# Patient Record
Sex: Female | Born: 1945
Health system: Southern US, Community
[De-identification: ages and names within clinical notes are randomized; demographics above are authoritative.]

## PROBLEM LIST (undated history)

## (undated) DIAGNOSIS — F329 Major depressive disorder, single episode, unspecified: Secondary | ICD-10-CM

## (undated) DIAGNOSIS — E785 Hyperlipidemia, unspecified: Secondary | ICD-10-CM

## (undated) DIAGNOSIS — S79911A Unspecified injury of right hip, initial encounter: Secondary | ICD-10-CM

## (undated) DIAGNOSIS — K5792 Diverticulitis of intestine, part unspecified, without perforation or abscess without bleeding: Secondary | ICD-10-CM

## (undated) DIAGNOSIS — S0591XA Unspecified injury of right eye and orbit, initial encounter: Secondary | ICD-10-CM

## (undated) DIAGNOSIS — K219 Gastro-esophageal reflux disease without esophagitis: Secondary | ICD-10-CM

## (undated) DIAGNOSIS — N059 Unspecified nephritic syndrome with unspecified morphologic changes: Secondary | ICD-10-CM

## (undated) DIAGNOSIS — H269 Unspecified cataract: Secondary | ICD-10-CM

## (undated) DIAGNOSIS — R748 Abnormal levels of other serum enzymes: Secondary | ICD-10-CM

## (undated) DIAGNOSIS — J690 Pneumonitis due to inhalation of food and vomit: Secondary | ICD-10-CM

## (undated) DIAGNOSIS — R3129 Other microscopic hematuria: Secondary | ICD-10-CM

## (undated) DIAGNOSIS — H3530 Unspecified macular degeneration: Secondary | ICD-10-CM

## (undated) DIAGNOSIS — K635 Polyp of colon: Secondary | ICD-10-CM

## (undated) DIAGNOSIS — I10 Essential (primary) hypertension: Secondary | ICD-10-CM

## (undated) DIAGNOSIS — F32A Depression, unspecified: Secondary | ICD-10-CM

## (undated) DIAGNOSIS — Z9889 Other specified postprocedural states: Secondary | ICD-10-CM

## (undated) DIAGNOSIS — D649 Anemia, unspecified: Secondary | ICD-10-CM

## (undated) DIAGNOSIS — E669 Obesity, unspecified: Secondary | ICD-10-CM

## (undated) DIAGNOSIS — M545 Low back pain, unspecified: Secondary | ICD-10-CM

## (undated) DIAGNOSIS — R112 Nausea with vomiting, unspecified: Secondary | ICD-10-CM

## (undated) DIAGNOSIS — H35039 Hypertensive retinopathy, unspecified eye: Secondary | ICD-10-CM

## (undated) DIAGNOSIS — G629 Polyneuropathy, unspecified: Secondary | ICD-10-CM

## (undated) DIAGNOSIS — J309 Allergic rhinitis, unspecified: Secondary | ICD-10-CM

## (undated) DIAGNOSIS — M5126 Other intervertebral disc displacement, lumbar region: Secondary | ICD-10-CM

## (undated) DIAGNOSIS — E119 Type 2 diabetes mellitus without complications: Secondary | ICD-10-CM

## (undated) DIAGNOSIS — M722 Plantar fascial fibromatosis: Secondary | ICD-10-CM

## (undated) DIAGNOSIS — F419 Anxiety disorder, unspecified: Secondary | ICD-10-CM

## (undated) HISTORY — DX: Hyperlipidemia, unspecified: E78.5

## (undated) HISTORY — PX: ABDOMINAL HYSTERECTOMY: SHX81

## (undated) HISTORY — DX: Other microscopic hematuria: R31.29

## (undated) HISTORY — DX: Unspecified injury of right hip, initial encounter: S79.911A

## (undated) HISTORY — PX: TUBAL LIGATION: SHX77

## (undated) HISTORY — DX: Type 2 diabetes mellitus without complications: E11.9

## (undated) HISTORY — DX: Polyneuropathy, unspecified: G62.9

## (undated) HISTORY — DX: Essential (primary) hypertension: I10

## (undated) HISTORY — DX: Gastro-esophageal reflux disease without esophagitis: K21.9

## (undated) HISTORY — DX: Allergic rhinitis, unspecified: J30.9

## (undated) HISTORY — PX: EYE SURGERY: SHX253

## (undated) HISTORY — DX: Unspecified injury of right eye and orbit, initial encounter: S05.91XA

## (undated) HISTORY — DX: Low back pain, unspecified: M54.50

## (undated) HISTORY — PX: OTHER SURGICAL HISTORY: SHX169

## (undated) HISTORY — DX: Obesity, unspecified: E66.9

## (undated) HISTORY — DX: Hypertensive retinopathy, unspecified eye: H35.039

## (undated) HISTORY — PX: BREAST BIOPSY: SHX20

## (undated) HISTORY — PX: PERONEAL NERVE DECOMPRESSION: SHX2226

## (undated) HISTORY — DX: Unspecified cataract: H26.9

## (undated) HISTORY — DX: Abnormal levels of other serum enzymes: R74.8

## (undated) HISTORY — DX: Polyp of colon: K63.5

## (undated) HISTORY — PX: BACK SURGERY: SHX140

## (undated) HISTORY — PX: APPENDECTOMY: SHX54

## (undated) HISTORY — PX: CHOLECYSTECTOMY: SHX55

## (undated) HISTORY — DX: Anxiety disorder, unspecified: F41.9

## (undated) HISTORY — PX: GANGLION CYST EXCISION: SHX1691

## (undated) HISTORY — DX: Plantar fascial fibromatosis: M72.2

## (undated) HISTORY — DX: Unspecified macular degeneration: H35.30

---

## 1991-10-01 HISTORY — PX: LIPOMA EXCISION: SHX5283

## 1998-10-05 ENCOUNTER — Ambulatory Visit (HOSPITAL_COMMUNITY): Admission: RE | Admit: 1998-10-05 | Discharge: 1998-10-05 | Payer: Self-pay | Admitting: Internal Medicine

## 1999-02-20 ENCOUNTER — Other Ambulatory Visit: Admission: RE | Admit: 1999-02-20 | Discharge: 1999-02-20 | Payer: Self-pay | Admitting: Obstetrics and Gynecology

## 1999-09-05 ENCOUNTER — Encounter: Payer: Self-pay | Admitting: Internal Medicine

## 1999-09-05 ENCOUNTER — Encounter: Admission: RE | Admit: 1999-09-05 | Discharge: 1999-09-05 | Payer: Self-pay | Admitting: Internal Medicine

## 2000-03-03 ENCOUNTER — Ambulatory Visit (HOSPITAL_COMMUNITY): Admission: RE | Admit: 2000-03-03 | Discharge: 2000-03-03 | Payer: Self-pay | Admitting: Obstetrics and Gynecology

## 2000-03-03 ENCOUNTER — Encounter: Payer: Self-pay | Admitting: Obstetrics and Gynecology

## 2000-04-21 ENCOUNTER — Other Ambulatory Visit: Admission: RE | Admit: 2000-04-21 | Discharge: 2000-04-21 | Payer: Self-pay | Admitting: Obstetrics and Gynecology

## 2000-12-15 ENCOUNTER — Ambulatory Visit (HOSPITAL_COMMUNITY): Admission: RE | Admit: 2000-12-15 | Discharge: 2000-12-15 | Payer: Self-pay | Admitting: Gastroenterology

## 2001-03-13 ENCOUNTER — Encounter: Payer: Self-pay | Admitting: Obstetrics and Gynecology

## 2001-03-13 ENCOUNTER — Ambulatory Visit (HOSPITAL_COMMUNITY): Admission: RE | Admit: 2001-03-13 | Discharge: 2001-03-13 | Payer: Self-pay | Admitting: Obstetrics and Gynecology

## 2001-07-09 ENCOUNTER — Other Ambulatory Visit: Admission: RE | Admit: 2001-07-09 | Discharge: 2001-07-09 | Payer: Self-pay | Admitting: Obstetrics and Gynecology

## 2002-05-04 ENCOUNTER — Ambulatory Visit (HOSPITAL_COMMUNITY): Admission: RE | Admit: 2002-05-04 | Discharge: 2002-05-04 | Payer: Self-pay | Admitting: Obstetrics and Gynecology

## 2002-05-04 ENCOUNTER — Encounter: Payer: Self-pay | Admitting: Obstetrics and Gynecology

## 2003-01-27 ENCOUNTER — Other Ambulatory Visit: Admission: RE | Admit: 2003-01-27 | Discharge: 2003-01-27 | Payer: Self-pay | Admitting: Obstetrics and Gynecology

## 2003-08-29 ENCOUNTER — Encounter: Admission: RE | Admit: 2003-08-29 | Discharge: 2003-11-27 | Payer: Self-pay | Admitting: Internal Medicine

## 2003-12-17 ENCOUNTER — Emergency Department (HOSPITAL_COMMUNITY): Admission: AD | Admit: 2003-12-17 | Discharge: 2003-12-17 | Payer: Self-pay | Admitting: Family Medicine

## 2004-03-14 ENCOUNTER — Encounter (HOSPITAL_BASED_OUTPATIENT_CLINIC_OR_DEPARTMENT_OTHER): Admission: RE | Admit: 2004-03-14 | Discharge: 2004-04-20 | Payer: Self-pay | Admitting: Internal Medicine

## 2007-09-28 ENCOUNTER — Ambulatory Visit: Payer: Self-pay | Admitting: Cardiology

## 2007-10-06 ENCOUNTER — Ambulatory Visit: Payer: Self-pay

## 2007-10-28 ENCOUNTER — Ambulatory Visit: Payer: Self-pay | Admitting: Cardiology

## 2008-02-24 ENCOUNTER — Ambulatory Visit: Payer: Self-pay | Admitting: Cardiology

## 2008-02-27 ENCOUNTER — Ambulatory Visit (HOSPITAL_COMMUNITY): Admission: RE | Admit: 2008-02-27 | Discharge: 2008-02-29 | Payer: Self-pay | Admitting: Neurosurgery

## 2008-11-24 ENCOUNTER — Encounter: Admission: RE | Admit: 2008-11-24 | Discharge: 2008-11-24 | Payer: Self-pay | Admitting: Neurosurgery

## 2009-02-01 ENCOUNTER — Encounter: Payer: Self-pay | Admitting: Cardiology

## 2009-02-15 DIAGNOSIS — K219 Gastro-esophageal reflux disease without esophagitis: Secondary | ICD-10-CM | POA: Insufficient documentation

## 2009-02-15 DIAGNOSIS — I10 Essential (primary) hypertension: Secondary | ICD-10-CM | POA: Insufficient documentation

## 2009-02-15 DIAGNOSIS — E669 Obesity, unspecified: Secondary | ICD-10-CM | POA: Insufficient documentation

## 2009-02-15 DIAGNOSIS — H269 Unspecified cataract: Secondary | ICD-10-CM | POA: Insufficient documentation

## 2009-02-15 DIAGNOSIS — E785 Hyperlipidemia, unspecified: Secondary | ICD-10-CM | POA: Insufficient documentation

## 2009-02-15 DIAGNOSIS — E119 Type 2 diabetes mellitus without complications: Secondary | ICD-10-CM | POA: Insufficient documentation

## 2009-03-27 ENCOUNTER — Ambulatory Visit: Payer: Self-pay | Admitting: Cardiology

## 2010-03-13 ENCOUNTER — Ambulatory Visit: Payer: Self-pay | Admitting: Cardiology

## 2010-09-27 ENCOUNTER — Ambulatory Visit (HOSPITAL_COMMUNITY)
Admission: RE | Admit: 2010-09-27 | Discharge: 2010-09-27 | Payer: Self-pay | Source: Home / Self Care | Attending: Gastroenterology | Admitting: Gastroenterology

## 2010-11-01 NOTE — Assessment & Plan Note (Signed)
Summary: PER CHECK OUT/SF  Medications Added METFORMIN HCL 500 MG TABS (METFORMIN HCL) 2 tabs two times a day EFFEXOR XR 150 MG XR24H-CAP (VENLAFAXINE HCL) 1 cap once daily GABAPENTIN 300 MG CAPS (GABAPENTIN) 1 cap three times a day        Visit Type:  1 yr f/u Primary Provider:  [name] MD  CC:  pt c/o left carotid neck pain when she is on the treadmill...no other complaints todday..pt has lost 10 lb since 10/2008.  History of Present Illness: Tina Briggs returns today for evaluation and management of her multiple cardiac risk factors.  Her blood sugar started to get out of control earlier this year. She began to walk 2 hours a week. She has lost 12 pounds! She feels remarkably better. She denies any angina or ischemic symptoms. She's had no edema.  Her blood pressures under good control. Recent hemoglobin A1c was 7%. She is working on this. Her lipids have been under good control.  Current Medications (verified): 1)  Prilosec 20 Mg Cpdr (Omeprazole) .Marland Kitchen.. 1 Tab Once Daily 2)  Metformin Hcl 500 Mg Tabs (Metformin Hcl) .... 2 Tabs Two Times A Day 3)  Aspirin 81 Mg Tbec (Aspirin) .... Take One Tablet By Mouth Daily 4)  Januvia 100 Mg Tabs (Sitagliptin Phosphate) .Marland Kitchen.. 1 Tab Once Daily 5)  Effexor Xr 150 Mg Xr24h-Cap (Venlafaxine Hcl) .Marland Kitchen.. 1 Cap Once Daily 6)  Crestor 40 Mg Tabs (Rosuvastatin Calcium) .Marland Kitchen.. 1 Tab At Bedtime 7)  Clarinex 5 Mg Tabs (Desloratadine) .... As Needed 8)  Flonase 50 Mcg/act Susp (Fluticasone Propionate) .... As Needed 9)  Tylenol Extra Strength 500 Mg Tabs (Acetaminophen) .... As Needed 10)  Advil 200 Mg Tabs (Ibuprofen) .... As Needed 11)  Gabapentin 300 Mg Caps (Gabapentin) .Marland Kitchen.. 1 Cap Three Times A Day 12)  Losartan Potassium 100 Mg Tabs (Losartan Potassium) .Marland Kitchen.. 1 Tab Once Daily  Allergies: 1)  ! * Cyclines 2)  ! Codeine 3)  ! Erythromycin  Past History:  Past Medical History: Last updated: 02/15/2009 HYPERLIPIDEMIA-MIXED  (ICD-272.4) HYPERTENSION, UNSPECIFIED (ICD-401.9) OBESITY (ICD-278.00) GERD (ICD-530.81) DM (ICD-250.00) CATARACT, RIGHT EYE (ICD-366.9)    Past Surgical History: Last updated: 02/15/2009 Appendectomy 1973 Cholecystectomy 1973 Hysterectomy 1992 Left breast bx 1984 Tubal ligation 1976  Family History: Last updated: 02/15/2009 Neg for premature heart disease  Social History: Last updated: 02/15/2009 Full Time Tobacco Use - No.   Risk Factors: Smoking Status: never (02/15/2009)  Review of Systems       negative history of present illness  Vital Signs:  Patient profile:   65 year old female Height:      63 inches Weight:      187 pounds BMI:     33.25 Pulse rate:   80 / minute Pulse rhythm:   irregular BP sitting:   108 / 70  (left arm) Cuff size:   large  Vitals Entered By: Tina Briggs, CMA (March 13, 2010 11:28 AM)  Physical Exam  General:  obese but has lost weight.no acute distress Head:  normocephalic and atraumatic Eyes:  PERRLA/EOM intact; conjunctiva and lids normal. Neck:  Neck supple, no JVD. No masses, thyromegaly or abnormal cervical nodes. Chest Yeng Frankie:  no deformities or breast masses noted Lungs:  Clear bilaterally to auscultation and percussion. Heart:  Non-displaced PMI, chest non-tender; regular rate and rhythm, S1, S2 without murmurs, rubs or gallops. Carotid upstroke normal, no bruit. Normal abdominal aortic size, no bruits. Femorals normal pulses, no bruits. Pedals normal pulses.  No edema, no varicosities. Msk:  Back normal, normal gait. Muscle strength and tone normal. Pulses:  pulses normal in all 4 extremities Extremities:  No clubbing or cyanosis. Neurologic:  Alert and oriented x 3. Skin:  Intact without lesions or rashes. Psych:  Normal affect.   EKG  Procedure date:  03/13/2010  Findings:      normal sinus rhythm, left axis deviation, no change  Impression & Recommendations:  Problem # 1:  HYPERTENSION, UNSPECIFIED  (ICD-401.9) Assessment Improved  The following medications were removed from the medication list:    Cozaar 100 Mg Tabs (Losartan potassium) .Marland Kitchen... 1 tab once daily Her updated medication list for this problem includes:    Aspirin 81 Mg Tbec (Aspirin) .Marland Kitchen... Take one tablet by mouth daily    Losartan Potassium 100 Mg Tabs (Losartan potassium) .Marland Kitchen... 1 tab once daily  Problem # 2:  OBESITY (ICD-278.00) Assessment: Improved  Problem # 3:  HYPERLIPIDEMIA-MIXED (ICD-272.4) Assessment: Improved  Her updated medication list for this problem includes:    Crestor 40 Mg Tabs (Rosuvastatin calcium) .Marland Kitchen... 1 tab at bedtime  Problem # 4:  DM (ICD-250.00) Assessment: Improved  The following medications were removed from the medication list:    Cozaar 100 Mg Tabs (Losartan potassium) .Marland Kitchen... 1 tab once daily Her updated medication list for this problem includes:    Metformin Hcl 500 Mg Tabs (Metformin hcl) .Marland Kitchen... 2 tabs two times a day    Aspirin 81 Mg Tbec (Aspirin) .Marland Kitchen... Take one tablet by mouth daily    Januvia 100 Mg Tabs (Sitagliptin phosphate) .Marland Kitchen... 1 tab once daily    Losartan Potassium 100 Mg Tabs (Losartan potassium) .Marland Kitchen... 1 tab once daily  Patient Instructions: 1)  Your physician recommends that you schedule a follow-up appointment in: 1 year with Dr. Daleen Squibb 2)  Your physician recommends that you continue on your current medications as directed. Please refer to the Current Medication list given to you today. Prescriptions: CRESTOR 40 MG TABS (ROSUVASTATIN CALCIUM) 1 tab at bedtime  #90 x 3   Entered by:   Lisabeth Devoid RN   Authorized by:   Gaylord Shih, MD, [facility]   Signed by:   Lisabeth Devoid RN on 03/13/2010   Method used:   Electronically to        [facility] [phone]* (retail)       [address]       [place], [place]  [address]       Ph: [phone] or [phone]       Fax: [phone]   RxID:   [id number]

## 2010-12-10 LAB — GLUCOSE, CAPILLARY: Glucose-Capillary: 167 mg/dL — ABNORMAL HIGH (ref 70–99)

## 2011-02-12 NOTE — Assessment & Plan Note (Signed)
[facility]                            CARDIOLOGY OFFICE NOTE   NAME:Deweese, LUS KRIEGEL                  MRN:          [id number]  DATE:10/28/2007                            DOB:          July 08, 1946    ADDENDUM:   Please send a copy of report to Dr. Richardean Chimera at [facility]  and also to Dr. [name], [facility] at [facility],  [facility], [address], [address] [place],  [place], [place] [address].     Thomas C. Daleen Squibb, MD, [facility]  Electronically Signed    TCW/MedQ  DD: 10/28/2007  DT: 10/28/2007  Job #: [id number]

## 2011-02-12 NOTE — Assessment & Plan Note (Signed)
[facility]                            CARDIOLOGY OFFICE NOTE   NAME:Pineiro, QUINTAVIA ROGSTAD                  MRN:          [id number]  DATE:10/28/2007                            DOB:          December 28, 1945    Ms. Lahue returns today after being seen initially on September 28, 2007.   PROBLEM LIST:  1. Essential hypertension that has been more difficult to control      lately.  2. Type 2 diabetes.  3. Obesity.  4. Sedentary lifestyle.  5. Hyperlipidemia.   Her exercise rest-stress Myoview demonstrated no obstructive coronary  disease.  Her ejection fraction was 73% with normal wall motion and  contractility.  She had a hypertensive blood pressure response to  exercise.  She exercised for only 4-1/2 minutes but her blood pressure  increased to 196 systolic.   She brings in her blood work today from the outside, which showed a  total cholesterol of 154, triglycerides of 189, direct LDL of 93, HDL  45, cholesterol/HDL ratio of 3.42 on 40 mg of Lipitor or day.  Her blood  sugar was 145.  Hemoglobin A1c was 6.4%.   She also has checked numerous blood pressure for [place].  They are averaging  now on 100 mg of Cozaar (that is what we increased her dose to last  time) at about 130-135 at most over about 80-85.   Her blood pressure today is still remarkably good at 118/78.  Her pulse  is 82 and regular.  Her weight is down.  The rest of her exam is  unchanged.   I have had about a 30-minute discussion today with Ms. Raneri about  aggressive risk factor modification.  I do not think she will obtain her  LDL goal by doubling her Lipitor to 80 mg a day.  I have recommended  either Vytorin or Crestor at maximum dose.  I think the Crestor is  something we are both more comfortable with at 40 mg a day.   She will need follow-up lipids and LFTs in 6 weeks.  I will sit down and  talk to her again at that time.   Her blood pressure seems to be under good  control at this time with just  Cozaar.  She brought a list of medicines tried the past that she did not  tolerate, which included an ACE inhibitor, which causes a cough, and  diltiazem, which caused fatigue.   She will continue with aspirin 81 mg a day, tight blood sugar control,  and try to increase her activity.     Thomas C. Daleen Squibb, MD, [facility]  Electronically Signed    TCW/MedQ  DD: 10/28/2007  DT: 10/29/2007  Job #: [id number]   cc:   Juluis Mire, M.D.  [name], MD

## 2011-02-12 NOTE — Assessment & Plan Note (Signed)
[facility]                            CARDIOLOGY OFFICE NOTE   NAME:Tina Briggs                  MRN:          [id number]  DATE:09/28/2007                            DOB:          18-Feb-1946    I was asked by Dr. Richardean Chimera to consult on Tina Briggs  concerning her hypertension.   HISTORY OF PRESENT ILLNESS:  She is 65 years of age, married and has two  children.  She has been having increasing problems with blood pressure  control and says that she is getting concerned about it.  She also has  diabetes, which is well controlled with hemoglobin A1c that run around  6%.   She says her systolic blood pressures are running around 151-60 at  times.   She has been on Cozaar for a number of years.  She is on 50 mg a day.   She has other cardiac risk factors including type 2 diabetes as  mentioned above and also hyperlipidemia.  She had been on Lipitor for at  least 5-6 years.  She says her heart rate usually runs about 85-90 beats  a minute.   PAST MEDICAL HISTORY:  SHE IS INTOLERANT OF MORPHINE, CODEINE, CYCLINE,  AMOXICILLIN, ERYTHROMYCIN MINOCYCLINE.   She does not smoke and does not drink alcohol.   She does not exercise on a regular basis.   CURRENT MEDICATIONS:  1. Prilosec 20 mg a day.  2. Cozaar 50 mg a day.  3. Metformin 500 mg p.o. b.i.d.  4. Aspirin 81 mg a day.  5. Effexor XR 75 mg a day.  6. Januvia 100 mg daily.  7. Lipitor 40 mg a day.  8. P.r.n. Clarinex, Flonase, Tylenol,  Advil and Sudafed p.r.n..   HISTORY OF SURGERIES:  1. Hysterectomy 1992.  2. Left breast biopsy in 1984.  3. Tubal ligation 1976.  4. Cholecystectomy 1973.  5. Appendectomy 1973.   FAMILY HISTORY:  Is negative for premature heart disease.   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   REVIEW OF SYSTEMS:  She denies any orthopnea, PND,  peripheral edema,  syncope, presyncope, angina or dyspnea on exertion of any significant  degree. Rest of review of systems are negative except for history of  gastroesophageal reflux, urinary tract problems as a child and anxiety  and depression.   PHYSICAL EXAMINATION:  Blood pressure 118/74.  Her pulse is 90-100 and  regular.  Her electrocardiogram is normal.  She is 5 feet and 3 inches.  Weight is 198 pounds.  Extremely pleasant.  Alert and oriented x3.  HEENT:  Normocephalic, atraumatic.  PERRLA.  Extraocular movements  intact.  Sclerae clear.  Dentition satisfactory; she wears glasses.  NECK: is supple.  Carotid upstrokes were equal bilaterally without  bruits.  There is no JVD.  Thyroid is not enlarged.  Trachea is midline.  LUNGS:  Clear.  HEART:  Reveals poorly appreciated PMI.  Normal S1-S2.  ABDOMEN: soft, good bowel sounds.  No midline bruits. There is no  hepatomegaly. Organomegaly was difficult to assess in general.  EXTREMITIES:  No edema.  Pulses are intact.  NEURO:  Exam is intact.  SKIN: is unremarkable.   ASSESSMENT:  1. Essential hypertension, now not responsible to a low dose of a      single class of antihypertensive.  2. Coronary equivalency with her type 2 diabetes and other risk      factors including her hyperlipidemia.   PLAN:  1. Exercise rest/stress Myoview to rule out any obstructive coronary      disease.  2. Increase Cozaar to 100 mg a day.  3. Keep blood pressure log.  4. Followup with me in a couple weeks to discuss her blood pressure      findings.  I have advised her that she is probably going to need at      least 2 or 3 different classes of antihypertensives to probably      control her blood pressure goal which is 130/80.  She says she was      intolerant to blood pressure medicines in the past and she will      bring a record of this.  She will also bring her cholesterol      profile me as well.     Thomas C. Daleen Squibb, MD, [facility]   Electronically Signed    TCW/MedQ  DD: 09/28/2007  DT: 09/28/2007  Job #: [id number]   cc:   Tina Briggs, M.D.

## 2011-02-12 NOTE — Assessment & Plan Note (Signed)
[facility]                            CARDIOLOGY OFFICE NOTE   NAME:Briggs, Tina KOLTZ                  MRN:          [id number]  DATE:02/24/2008                            DOB:          Nov 03, 1945    Tina Briggs returns today for further management of her hypertension  as well as her mixed hyperlipidemia.   She has had a very tough year.  She was very sick this winter with a  sinus upper respiratory condition that she could not shake.  In  addition, she has had a hard time with her joints as well as now she has  a slipped disk with numbness her left leg.   She has complied with medications and is currently on Crestor 40 mg  nightly as well as Cozaar 100 mg a day.  She takes aspirin 81 mg a day  as well as metformin 500 b.i.d.   Her laboratory data in April with Dr. Donette Larry showed a hemoglobin A1c of  6.1%, normal LFTs, total cholesterol 136, LDL 64, HDL 44, triglycerides  of 409.   We talked today about dietary restriction with carbs.   Her blood pressure is 142/88, her pulse is 100 and regular.  Her weight  is 189 which is down 2.  HEENT:  No change.  Carotid upstrokes are equal bilateral without bruits, no JVD.  Thyroid  is not enlarged.  Trachea is midline.  LUNGS:  Clear.  HEART:  Reveals regular rate and rhythm.  PMI is hard to appreciate.  ABDOMEN:  Soft, good bowel sounds.  No midline bruits.  EXTREMITIES:  No cyanosis, clubbing or edema.  Pulses are intact.  NEURO:  Exam is intact.   [name] is doing pretty well with her numbers.  I have encouraged  her to watch carbs and try to reduce her weight further.  I do not think  she will be able to increase her exercise very much because of her back.  I will see her back again in April 2010.     Thomas C. Daleen Squibb, MD, [facility]  Electronically Signed    TCW/MedQ  DD: 02/24/2008  DT: 02/24/2008  Job #: [id number]   cc:   [name], MD

## 2011-02-12 NOTE — Op Note (Signed)
Tina Briggs, Tina Briggs           ACCOUNT NO.:  [id number]   MEDICAL RECORD NO.:  [id number]          PATIENT TYPE:  INP   LOCATION:  2852                         FACILITY:  [facility]   PHYSICIAN:  Hewitt Shorts, M.D.DATE OF BIRTH:  11/21/1945   DATE OF PROCEDURE:  DATE OF DISCHARGE:                               OPERATIVE REPORT   PREOPERATIVE DIAGNOSES:  1. Left L3-L4 extraforaminal lumbar disc herniation.  2. Lumbar degenerative disc disease.  3. Lumbar spondylosis.  4. Lumbar radiculopathy.   POSTOPERATIVE DIAGNOSES:  1. Left L3-L4 extraforaminal lumbar disc herniation.  2. Lumbar degenerative disc disease.  3. Lumbar spondylosis.  4. Lumbar radiculopathy.   PROCEDURE:  Left L3-L4 extraforaminal microdiskectomy with  microdissection.   SURGEON:  Hewitt Shorts, M.D.   ASSISTANT:  Nelia Shi. Webb Silversmith, RN.   ANESTHESIA:  General endotracheal.   INDICATIONS:  The patient is a 65 year old woman who presented with left  lumbar radiculopathy.  MRI scan revealed multilevel degenerative disc  disease and spondylosis.  She has relative canal stenosis at the L3-L4  and L4-L5 levels, but the most significant finding was a moderately  enlarged left L3-L4 extraforaminal lumbar disc herniation compressing  the exiting left L3 nerve root.  The decision was made to proceed with  extraforaminal microdiskectomy.   PROCEDURE:  The patient was brought to the operating room and placed  under general endotracheal anesthesia.  The patient was turned to a  prone position.  The lumbar region was prepped with Betadine soap and  solution and draped in a sterile fashion.  The midline was infiltrated  with local anesthetic with epinephrine and x-ray was taken.  The L3-L4  level was identified and a midline incision was made over the L3-L4  level and carried down through the subcutaneous tissue.  Bipolar  electrocautery was used to maintain hemostasis.  Dissection was carried  down to the  lumbar fascia, which was incised on the left side of the  midline.  The paraspinal muscles were dissected from the spinous process  and lamina in a subperiosteal fashion.  The L3-L4 interlaminar space was  identified. Then, the x-ray was taken to confirm the localization and  then the microscope was draped and brought to the field to provide  additional navigation, illumination, and visualization.  The remainder  of the decompression was performed using microdissection and  microsurgical technique.   Dissection was carried down laterally over the facet complex and we  identified the left L3 and left L4 transverse processes.  We dissected  the musculature laterally and then performed a lateral facetectomy and  dissected down through the intertransverse fascia and identified the  left L3 nerve root.  We then worked medial to that and identified the  disc herniation.  The disc was quite degenerated.  We then dissected  further rostrally and identified the disc base.  The remaining annular  fibers were incised and discectomy begun with variety of pituitary  rongeurs.  There was herniated fragments that extended above the level  of disc base compressing the exiting L3 nerve root and these were  dissected from the surrounding fascial  tissues.  A thorough discectomy  was performed to remove this and all loose fragments and disc material  from both the extraforaminal space and disc space and good decompression  of the exiting L3 nerve root was achieved and all loose fragments and  disc material removed.  Hemostasis, which was established with the  command use of bipolar cautery as well as Gelfoam soaked with thrombin.   We are able to remove the Gelfoam and confirm hemostasis.  The wound was  irrigated numerous times and then proceeded with bacitracin solution.  Once the discectomy was completed and the hemostasis was established, we  instilled 2 mL of fentanyl and 80 mg of Depo-Medrol into  the  extraforaminal space around the dorsal root ganglion and nerve root and  then we proceeded with closure.  The deep fascia was closed with  interrupted undyed #1 Vicryl sutures.  The Scarpa fascia was closed with  interrupted undyed #1 Vicryl sutures.  The subcutaneous and subcuticular  were closed with interrupted inverted 2-0 and 3-0 undyed Vicryl sutures.  The skin was approximated with Dermabond.  The procedure was tolerated  well.  The estimated blood loss was 25 mL.  Sponge and needle count were  correct.  Following surgery, the patient was returned back to the supine  position, reversed from the anesthetic, extubated, and transferred to  the recovery room for further care.      Hewitt Shorts, M.D.  Electronically Signed     RWN/MEDQ  D:  02/27/2008  T:  02/27/2008  Job:  [id number]

## 2011-02-15 NOTE — Consult Note (Signed)
NAME:  Tina Briggs, Tina Briggs                     ACCOUNT NO.:  [id number]   MEDICAL RECORD NO.:  [id number]                  PATIENT TYPE:  REC   LOCATION:                                       FACILITY:  [facility]   PHYSICIAN:  Jonelle Sports. Sevier, M.D.              DATE OF BIRTH:  [birth year]   DATE OF CONSULTATION:  DATE OF DISCHARGE:                                   CONSULTATION   HISTORY:  This 65 year old white female was seen at the courtesy of Dr.  Talmage Nap for foot evaluation in the face of type 2 diabetes.  The patient  apparently was first diagnosed with type 2 diabetes in October of 2004, and  has been in very good control with hemoglobin A1c recently of 6.5%.  She has  had no recognized complications of the disease, but has had occasional  burning and tingling sensation of the feet which does not persist, and has  occasionally had pain in the first MP joint area with some rigidity of the  toe, and some more proximal radiation of that pain.  This again is short-  lived and has happened only once or twice in her life, usually on the right  foot.  Her concern was that this might bear some relationship to diabetes,  and also apparently Dr. Horald Pollen wanted her to be given general information  regarding foot care.  She is here now for those purposes.   PAST MEDICAL HISTORY:  Notable for hypertension and hyperlipidemia in  addition to her diabetes which would certainly seem to suggest that she  quite possibly has a metabolic syndrome.  She also has gastroesophageal  reflux disease and a known cataract of the right eye.   ALLERGIES:  She has allergies to AMOXICILLIN, MINOCIN, ERYTHROMYCIN AND  CODEINE.   REGULAR MEDICATIONS:  1. Metformin.  2. Lipitor.  3. Cozaar.  4. Prilosec.  5. Multivitamin.  6. Hormone patch.   PHYSICAL EXAMINATION:  Examination today is limited to the distal lower  extremities.  The feet are free of edema, and there is no gross deformity,  although there is a  tendency, likely congenital, toward incurving of the 5th  toes bilaterally.  Skin temperatures are normal and symmetrical. Pulses are  everywhere palpable and quite adequate.  Monofilament testing shows  preservation of protective sensation throughout both feet.  There is minor  callous formation on the base of the 5th toes bilaterally at the fourth and  fifth metatarsal head, plantar aspects on the left foot, and on the  interphalangeal joint area of the hallux of the left foot.  There is callous  formation present bilaterally at the heels.   DISPOSITION:  1. The patient is given instruction regarding foot care and diabetes by     video with nurse and physician reinforcement.  2. The patient's foot symptoms are discussed at some considerable length.     She is advised that probably  the pain that she has experienced in the     first metatarsal head area on rare occasions are likely arthritic in     nature, not necessarily implying any propensity toward serious long-term     arthritis, but rather reflective of some temporary stress to that area.     In addition, the occasional numbness and tingling she has are likely     indicative of pressure neuropathy and are not in the pattern of diabetic     neuropathy.  She is reassured in this regard.  3. It is recommended to her that she consider wearing sneaker-type shoes     with a good cushion insert insole at work if that is allowed because     particularly in view of her overweight status, her tendency toward     pressure neuropathy when she is on her feet a good deal, would be     minimized.  Secondly, she is advised against wearing pointed toe shoes     because of the in curving she already has of her 5th toes.  4. She is given a silopad to wear on the 4th toe bilaterally which will     provide some cushioning and spacing between the 4th and 5th toes     bilateral.  5. The calluses at both heels and the other aforementioned areas are      dremeled gently without incident.  6. The patient is advised we would be happy to see her here on a p.r.n.     basis should she develop any consequential foot problems.                                               Jonelle Sports. Cheryll Cockayne, M.D.    RES/MEDQ  D:  04/12/2004  T:  04/12/2004  Job:  [id number]   cc:   [name], M.D.  [address]  [place]  [place] [address]  Fax: [phone]   Talmage Nap, Dr.  [facility]

## 2011-03-16 ENCOUNTER — Other Ambulatory Visit: Payer: Self-pay | Admitting: Cardiology

## 2011-04-06 ENCOUNTER — Other Ambulatory Visit: Payer: Self-pay | Admitting: Cardiology

## 2011-06-21 ENCOUNTER — Encounter: Payer: Self-pay | Admitting: *Deleted

## 2011-06-21 ENCOUNTER — Encounter: Payer: Self-pay | Admitting: Cardiology

## 2011-06-24 ENCOUNTER — Ambulatory Visit (INDEPENDENT_AMBULATORY_CARE_PROVIDER_SITE_OTHER): Payer: Self-pay | Admitting: Cardiology

## 2011-06-24 ENCOUNTER — Encounter: Payer: Self-pay | Admitting: Cardiology

## 2011-06-24 VITALS — BP 114/70 | HR 83 | Ht 63.0 in | Wt 194.0 lb

## 2011-06-24 DIAGNOSIS — I10 Essential (primary) hypertension: Secondary | ICD-10-CM

## 2011-06-24 DIAGNOSIS — E119 Type 2 diabetes mellitus without complications: Secondary | ICD-10-CM

## 2011-06-24 DIAGNOSIS — E785 Hyperlipidemia, unspecified: Secondary | ICD-10-CM

## 2011-06-24 DIAGNOSIS — E669 Obesity, unspecified: Secondary | ICD-10-CM

## 2011-06-24 NOTE — Assessment & Plan Note (Signed)
Improved.  No change in treatment 

## 2011-06-24 NOTE — Progress Notes (Signed)
HPI Tina Briggs comes in today for evaluation and management of multiple cardiovascular risk factors. Her weight has gone up. Her hemoglobin A1c is well above 7%. She is followed by primary care with Dr. Rene Paci.   She is active. She is on a good secondary preventative medical program. Meds reviewed and she is compliant.  She denies any angina or chest pain or ischemic symptoms. She denies orthopnea, PND or edema.  Her EKG today is normal except for minor criteria for LVH. Past Medical History  Diagnosis Date  . HYPERTENSION, UNSPECIFIED   . HYPERLIPIDEMIA-MIXED   . OBESITY   . GERD   . DM   . CATARACT, RIGHT EYE     Past Surgical History  Procedure Date  . Appendectomy   . Cholecystectomy   . Tubal ligation     No family history on file.  History   Social History  . Marital Status: Married    Spouse Name: N/A    Number of Children: N/A  . Years of Education: N/A   Occupational History  . Not on file.   Social History Main Topics  . Smoking status: Never Smoker   . Smokeless tobacco: Not on file  . Alcohol Use: Not on file  . Drug Use: Not on file  . Sexually Active: Not on file   Other Topics Concern  . Not on file   Social History Narrative  . No narrative on file    Allergies  Allergen Reactions  . Codeine   . Erythromycin   . Morphine And Related   . Penicillins     Current Outpatient Prescriptions  Medication Sig Dispense Refill  . aspirin 81 MG tablet Take 81 mg by mouth daily.        . CRESTOR 40 MG tablet TAKE 1 TABLET AT BEDTIME  90 tablet  3  . desloratadine (CLARINEX) 5 MG tablet Take 5 mg by mouth as needed.        . fluticasone (FLONASE) 50 MCG/ACT nasal spray Place 2 sprays into the nose as needed.        . gabapentin (NEURONTIN) 300 MG capsule Take 300 mg by mouth 3 (three) times daily.        Marland Kitchen ibuprofen (ADVIL,MOTRIN) 200 MG tablet Take 200 mg by mouth every 6 (six) hours as needed.        Marland Kitchen losartan (COZAAR) 100 MG tablet TAKE 1  TABLET EVERY DAY  30 tablet  5  . methocarbamol (ROBAXIN) 500 MG tablet Take 500 mg by mouth 2 (two) times daily.        . Multiple Vitamins-Minerals (MULTIVITAMIN WITH MINERALS) tablet Take 1 tablet by mouth daily.        . pantoprazole (PROTONIX) 40 MG tablet Take 40 mg by mouth daily.        . sitaGLIPtin (JANUVIA) 100 MG tablet Take 100 mg by mouth daily.        Marland Kitchen venlafaxine (EFFEXOR) 75 MG tablet Take 75 mg by mouth daily.          ROS Negative other than HPI.   PE General Appearance: well developed, well nourished in no acute distress, obese HEENT: symmetrical face, PERRLA, good dentition  Neck: no JVD, thyromegaly, or adenopathy, trachea midline Chest: symmetric without deformity Cardiac: PMI non-displaced, RRR, normal S1, S2, no gallop or murmur Lung: clear to ausculation and percussion Vascular: all pulses full without bruits  Abdominal: nondistended, nontender, good bowel sounds, no HSM, no bruits Extremities:  no cyanosis, clubbing or edema, no sign of DVT, no varicosities  Skin: normal color, no rashes Neuro: alert and oriented x 3, non-focal Pysch: normal affect Filed Vitals:   06/24/11 1001  BP: 114/70  Pulse: 83  Height: 5\' 3"  (1.6 m)  Weight: 194 lb (87.998 kg)    EKG  Labs and Studies Reviewed.   No results found for this basename: WBC, HGB, HCT, MCV, PLT      Chemistry   No results found for this basename: NA, K, CL, CO2, BUN, CREATININE, GLU   No results found for this basename: CALCIUM, ALKPHOS, AST, ALT, BILITOT       No results found for this basename: CHOL   No results found for this basename: HDL   No results found for this basename: LDLCALC   No results found for this basename: TRIG   No results found for this basename: CHOLHDL   No results found for this basename: HGBA1C   No results found for this basename: ALT, AST, GGT, ALKPHOS, BILITOT   No results found for this basename: TSH

## 2011-06-24 NOTE — Assessment & Plan Note (Signed)
Followed by primary care, I will see p.r.n.

## 2011-06-24 NOTE — Assessment & Plan Note (Signed)
Deteriorated. See my recommendations under diabetes.

## 2011-06-24 NOTE — Patient Instructions (Signed)
Your physician encouraged you to lose weight for better health.  Your physician recommends that you schedule a follow-up appointment in:  As needed with Dr. Daleen Squibb

## 2011-06-24 NOTE — Assessment & Plan Note (Signed)
Poor control. I have strongly urged her to lose a significant amount of weight as she approaches the morbid obesity range. Secondary effects of uncontrolled diabetes reviewed including stroke, cardiovascular disease, renal disease, and PAD. She will follow with her primary care for this.

## 2011-06-26 LAB — CBC
HCT: 36.7
Hemoglobin: 12.4
MCHC: 33.9
MCV: 86.7
Platelets: 253
RBC: 4.24
RDW: 14.9
WBC: 11.9 — ABNORMAL HIGH

## 2011-06-26 LAB — BASIC METABOLIC PANEL
BUN: 18
CO2: 26
Calcium: 9.6
Chloride: 104
Creatinine, Ser: 0.85
GFR calc Af Amer: >60
GFR calc non Af Amer: >60
Glucose, Bld: 115 — ABNORMAL HIGH
Potassium: 4
Sodium: 139

## 2011-07-04 ENCOUNTER — Other Ambulatory Visit: Payer: Self-pay | Admitting: Gastroenterology

## 2011-07-04 ENCOUNTER — Ambulatory Visit (HOSPITAL_COMMUNITY)
Admission: RE | Admit: 2011-07-04 | Discharge: 2011-07-04 | Disposition: A | Discharge status: Home / Self Care | Payer: BC Managed Care – PPO | Source: Ambulatory Visit | Attending: Gastroenterology | Admitting: Gastroenterology

## 2011-07-04 DIAGNOSIS — D131 Benign neoplasm of stomach: Secondary | ICD-10-CM | POA: Insufficient documentation

## 2011-07-04 DIAGNOSIS — E119 Type 2 diabetes mellitus without complications: Secondary | ICD-10-CM | POA: Insufficient documentation

## 2011-07-04 DIAGNOSIS — K219 Gastro-esophageal reflux disease without esophagitis: Secondary | ICD-10-CM | POA: Insufficient documentation

## 2011-07-04 DIAGNOSIS — K449 Diaphragmatic hernia without obstruction or gangrene: Secondary | ICD-10-CM | POA: Insufficient documentation

## 2011-07-04 DIAGNOSIS — R1310 Dysphagia, unspecified: Secondary | ICD-10-CM | POA: Insufficient documentation

## 2011-07-09 NOTE — Op Note (Signed)
  NAMEKAYLYNNE, ANDRES NO.:  [id number]  MEDICAL RECORD NO.:  [id number]  LOCATION:  [facility]                         FACILITY:  [facility]  PHYSICIAN:  Danise Edge, M.D.   DATE OF BIRTH:  07-27-1946  DATE OF PROCEDURE:  07/04/2011 DATE OF DISCHARGE:                              OPERATIVE REPORT   PROCEDURE:  Diagnostic esophagogastroduodenoscopy with gastric polyp biopsy and esophageal biopsies.  REFERRING PHYSICIAN:  [name], MD.  HISTORY:  Ms. Brunetta Newingham is a 66 year old female with chronic gastroesophageal reflux.  She is currently taking pantoprazole 40 mg each morning with good control of her reflux symptoms.  She intermittently experiences solid food dysphagia without odynophagia.  On September 27, 2010, the patient underwent a normal surveillance colonoscopy.  She is due for a repeat surveillance colonoscopy in 2014.  The patient has chronic type 2 diabetes mellitus and probable diabetic gastroparesis.  ENDOSCOPIST:  Danise Edge, M.D.  PREMEDICATION: 1. Fentanyl 100 mcg. 2. Versed 7.5 mg.  PROCEDURE IN DETAIL:  The patient was placed in the left lateral decubitus position.  The Pentax gastroscope was passed through the posterior hypopharynx into the proximal esophagus without difficulty. The hypopharynx, larynx, and vocal cords appeared normal.  Esophagoscopy:  The proximal mid and lower segments of the esophageal mucosa appeared normal.  The squamocolumnar junction was noted at 35 cm from the incisor teeth.  There was no endoscopic evidence for the presence of erosive esophagitis, esophageal stricture formation, or esophageal obstruction.  There was no signs of Barrett esophagus.  Gastroscopy:  There was a moderate-sized hiatal hernia.  Retroflex view of the gastric, cardia, and fundus was normal.  The diaphragmatic hiatus was patulous.  There was a large amount of retained solid food filling the gastric body and I was unable to  visualize the gastric body mucosa. The proximal gastric antral mucosa appears normal.  There are two small (4-5 mm) polyps in the prepyloric gastric antrum, which were biopsied. The pylorus appeared normal.  Duodenoscopy:  The duodenal bulb and descending duodenum appeared normal.  Biopsies:  Biopsies were taken along the length of the esophagus to look for eosinophilic esophagitis.  Biopsies were taken from the two small prepyloric gastric antral polyps.  ASSESSMENT: 1. There is a large amount of retained solid food in the gastric body.     The patient probably has diabetic gastroparesis. 2. The esophagus appears normal.  Biopsies were taken to look for     eosinophilic esophagitis. 3. Two small prepyloric gastric antral polyps were biopsied. 4. There is no evidence for esophageal obstruction.          ______________________________ Danise Edge, M.D.     MJ/MEDQ  D:  07/04/2011  T:  07/04/2011  Job:  [id number]  Electronically Signed by Danise Edge M.D. on 07/09/2011 04:09:18 PM

## 2011-10-02 DIAGNOSIS — D649 Anemia, unspecified: Secondary | ICD-10-CM | POA: Diagnosis not present

## 2011-11-11 DIAGNOSIS — D509 Iron deficiency anemia, unspecified: Secondary | ICD-10-CM | POA: Diagnosis not present

## 2012-02-05 DIAGNOSIS — E669 Obesity, unspecified: Secondary | ICD-10-CM | POA: Diagnosis not present

## 2012-02-05 DIAGNOSIS — N189 Chronic kidney disease, unspecified: Secondary | ICD-10-CM | POA: Diagnosis not present

## 2012-02-05 DIAGNOSIS — E1129 Type 2 diabetes mellitus with other diabetic kidney complication: Secondary | ICD-10-CM | POA: Diagnosis not present

## 2012-02-05 DIAGNOSIS — E1149 Type 2 diabetes mellitus with other diabetic neurological complication: Secondary | ICD-10-CM | POA: Diagnosis not present

## 2012-02-05 DIAGNOSIS — R197 Diarrhea, unspecified: Secondary | ICD-10-CM | POA: Diagnosis not present

## 2012-02-05 DIAGNOSIS — G609 Hereditary and idiopathic neuropathy, unspecified: Secondary | ICD-10-CM | POA: Diagnosis not present

## 2012-02-13 DIAGNOSIS — J019 Acute sinusitis, unspecified: Secondary | ICD-10-CM | POA: Diagnosis not present

## 2012-02-19 DIAGNOSIS — K219 Gastro-esophageal reflux disease without esophagitis: Secondary | ICD-10-CM | POA: Diagnosis not present

## 2012-02-19 DIAGNOSIS — Z1331 Encounter for screening for depression: Secondary | ICD-10-CM | POA: Diagnosis not present

## 2012-02-19 DIAGNOSIS — Z23 Encounter for immunization: Secondary | ICD-10-CM | POA: Diagnosis not present

## 2012-02-19 DIAGNOSIS — N183 Chronic kidney disease, stage 3 unspecified: Secondary | ICD-10-CM | POA: Diagnosis not present

## 2012-02-19 DIAGNOSIS — E1149 Type 2 diabetes mellitus with other diabetic neurological complication: Secondary | ICD-10-CM | POA: Diagnosis not present

## 2012-02-19 DIAGNOSIS — E782 Mixed hyperlipidemia: Secondary | ICD-10-CM | POA: Diagnosis not present

## 2012-02-19 DIAGNOSIS — F411 Generalized anxiety disorder: Secondary | ICD-10-CM | POA: Diagnosis not present

## 2012-02-19 DIAGNOSIS — G609 Hereditary and idiopathic neuropathy, unspecified: Secondary | ICD-10-CM | POA: Diagnosis not present

## 2012-02-19 DIAGNOSIS — I10 Essential (primary) hypertension: Secondary | ICD-10-CM | POA: Diagnosis not present

## 2012-02-27 DIAGNOSIS — L821 Other seborrheic keratosis: Secondary | ICD-10-CM | POA: Diagnosis not present

## 2012-02-27 DIAGNOSIS — L659 Nonscarring hair loss, unspecified: Secondary | ICD-10-CM | POA: Diagnosis not present

## 2012-02-27 DIAGNOSIS — D239 Other benign neoplasm of skin, unspecified: Secondary | ICD-10-CM | POA: Diagnosis not present

## 2012-03-12 ENCOUNTER — Other Ambulatory Visit: Payer: Self-pay | Admitting: Cardiology

## 2012-04-30 DIAGNOSIS — E119 Type 2 diabetes mellitus without complications: Secondary | ICD-10-CM | POA: Diagnosis not present

## 2012-04-30 DIAGNOSIS — H3530 Unspecified macular degeneration: Secondary | ICD-10-CM | POA: Diagnosis not present

## 2012-04-30 DIAGNOSIS — Z961 Presence of intraocular lens: Secondary | ICD-10-CM | POA: Diagnosis not present

## 2012-04-30 DIAGNOSIS — H2640 Unspecified secondary cataract: Secondary | ICD-10-CM | POA: Diagnosis not present

## 2012-05-14 DIAGNOSIS — M779 Enthesopathy, unspecified: Secondary | ICD-10-CM | POA: Diagnosis not present

## 2012-05-18 DIAGNOSIS — E1129 Type 2 diabetes mellitus with other diabetic kidney complication: Secondary | ICD-10-CM | POA: Diagnosis not present

## 2012-05-18 DIAGNOSIS — N189 Chronic kidney disease, unspecified: Secondary | ICD-10-CM | POA: Diagnosis not present

## 2012-05-18 DIAGNOSIS — E669 Obesity, unspecified: Secondary | ICD-10-CM | POA: Diagnosis not present

## 2012-05-28 DIAGNOSIS — M7660 Achilles tendinitis, unspecified leg: Secondary | ICD-10-CM | POA: Diagnosis not present

## 2012-05-28 DIAGNOSIS — M7730 Calcaneal spur, unspecified foot: Secondary | ICD-10-CM | POA: Diagnosis not present

## 2012-07-29 DIAGNOSIS — Z1231 Encounter for screening mammogram for malignant neoplasm of breast: Secondary | ICD-10-CM | POA: Diagnosis not present

## 2012-07-29 DIAGNOSIS — Z01419 Encounter for gynecological examination (general) (routine) without abnormal findings: Secondary | ICD-10-CM | POA: Diagnosis not present

## 2012-08-10 DIAGNOSIS — K219 Gastro-esophageal reflux disease without esophagitis: Secondary | ICD-10-CM | POA: Diagnosis not present

## 2012-08-10 DIAGNOSIS — I10 Essential (primary) hypertension: Secondary | ICD-10-CM | POA: Diagnosis not present

## 2012-08-10 DIAGNOSIS — E1149 Type 2 diabetes mellitus with other diabetic neurological complication: Secondary | ICD-10-CM | POA: Diagnosis not present

## 2012-08-10 DIAGNOSIS — Z23 Encounter for immunization: Secondary | ICD-10-CM | POA: Diagnosis not present

## 2012-08-10 DIAGNOSIS — E1129 Type 2 diabetes mellitus with other diabetic kidney complication: Secondary | ICD-10-CM | POA: Diagnosis not present

## 2012-08-10 DIAGNOSIS — N183 Chronic kidney disease, stage 3 unspecified: Secondary | ICD-10-CM | POA: Diagnosis not present

## 2012-08-10 DIAGNOSIS — E782 Mixed hyperlipidemia: Secondary | ICD-10-CM | POA: Diagnosis not present

## 2012-08-10 DIAGNOSIS — G609 Hereditary and idiopathic neuropathy, unspecified: Secondary | ICD-10-CM | POA: Diagnosis not present

## 2012-08-31 DIAGNOSIS — E1129 Type 2 diabetes mellitus with other diabetic kidney complication: Secondary | ICD-10-CM | POA: Diagnosis not present

## 2012-08-31 DIAGNOSIS — E669 Obesity, unspecified: Secondary | ICD-10-CM | POA: Diagnosis not present

## 2012-08-31 DIAGNOSIS — N189 Chronic kidney disease, unspecified: Secondary | ICD-10-CM | POA: Diagnosis not present

## 2012-09-10 DIAGNOSIS — M7730 Calcaneal spur, unspecified foot: Secondary | ICD-10-CM | POA: Diagnosis not present

## 2012-09-14 DIAGNOSIS — M7660 Achilles tendinitis, unspecified leg: Secondary | ICD-10-CM | POA: Diagnosis not present

## 2012-09-21 DIAGNOSIS — M7660 Achilles tendinitis, unspecified leg: Secondary | ICD-10-CM | POA: Diagnosis not present

## 2012-09-28 DIAGNOSIS — M7660 Achilles tendinitis, unspecified leg: Secondary | ICD-10-CM | POA: Diagnosis not present

## 2012-12-02 DIAGNOSIS — E1129 Type 2 diabetes mellitus with other diabetic kidney complication: Secondary | ICD-10-CM | POA: Diagnosis not present

## 2012-12-02 DIAGNOSIS — R259 Unspecified abnormal involuntary movements: Secondary | ICD-10-CM | POA: Diagnosis not present

## 2012-12-02 DIAGNOSIS — N189 Chronic kidney disease, unspecified: Secondary | ICD-10-CM | POA: Diagnosis not present

## 2012-12-02 DIAGNOSIS — E669 Obesity, unspecified: Secondary | ICD-10-CM | POA: Diagnosis not present

## 2013-02-03 DIAGNOSIS — K5732 Diverticulitis of large intestine without perforation or abscess without bleeding: Secondary | ICD-10-CM | POA: Diagnosis not present

## 2013-02-12 DIAGNOSIS — E119 Type 2 diabetes mellitus without complications: Secondary | ICD-10-CM | POA: Diagnosis not present

## 2013-02-12 DIAGNOSIS — H43819 Vitreous degeneration, unspecified eye: Secondary | ICD-10-CM | POA: Diagnosis not present

## 2013-02-12 DIAGNOSIS — Z961 Presence of intraocular lens: Secondary | ICD-10-CM | POA: Diagnosis not present

## 2013-02-12 DIAGNOSIS — H3530 Unspecified macular degeneration: Secondary | ICD-10-CM | POA: Diagnosis not present

## 2013-03-16 DIAGNOSIS — I10 Essential (primary) hypertension: Secondary | ICD-10-CM | POA: Diagnosis not present

## 2013-03-16 DIAGNOSIS — Z1331 Encounter for screening for depression: Secondary | ICD-10-CM | POA: Diagnosis not present

## 2013-03-16 DIAGNOSIS — E782 Mixed hyperlipidemia: Secondary | ICD-10-CM | POA: Diagnosis not present

## 2013-03-16 DIAGNOSIS — K219 Gastro-esophageal reflux disease without esophagitis: Secondary | ICD-10-CM | POA: Diagnosis not present

## 2013-03-16 DIAGNOSIS — Z0000 Encounter for general adult medical examination without abnormal findings: Secondary | ICD-10-CM | POA: Diagnosis not present

## 2013-03-16 DIAGNOSIS — E1129 Type 2 diabetes mellitus with other diabetic kidney complication: Secondary | ICD-10-CM | POA: Diagnosis not present

## 2013-03-16 DIAGNOSIS — F411 Generalized anxiety disorder: Secondary | ICD-10-CM | POA: Diagnosis not present

## 2013-03-16 DIAGNOSIS — N183 Chronic kidney disease, stage 3 unspecified: Secondary | ICD-10-CM | POA: Diagnosis not present

## 2013-03-17 ENCOUNTER — Other Ambulatory Visit: Payer: Self-pay | Admitting: Cardiology

## 2013-04-05 ENCOUNTER — Encounter (HOSPITAL_COMMUNITY): Payer: Self-pay | Admitting: Emergency Medicine

## 2013-04-05 ENCOUNTER — Emergency Department (HOSPITAL_COMMUNITY)
Admission: EM | Admit: 2013-04-05 | Discharge: 2013-04-05 | Disposition: A | Discharge status: Home / Self Care | Payer: Medicare Other | Attending: Emergency Medicine | Admitting: Emergency Medicine

## 2013-04-05 DIAGNOSIS — L02419 Cutaneous abscess of limb, unspecified: Secondary | ICD-10-CM | POA: Insufficient documentation

## 2013-04-05 DIAGNOSIS — E785 Hyperlipidemia, unspecified: Secondary | ICD-10-CM | POA: Insufficient documentation

## 2013-04-05 DIAGNOSIS — Z79899 Other long term (current) drug therapy: Secondary | ICD-10-CM | POA: Diagnosis not present

## 2013-04-05 DIAGNOSIS — K219 Gastro-esophageal reflux disease without esophagitis: Secondary | ICD-10-CM | POA: Diagnosis not present

## 2013-04-05 DIAGNOSIS — S80869A Insect bite (nonvenomous), unspecified lower leg, initial encounter: Secondary | ICD-10-CM | POA: Diagnosis not present

## 2013-04-05 DIAGNOSIS — E119 Type 2 diabetes mellitus without complications: Secondary | ICD-10-CM | POA: Diagnosis not present

## 2013-04-05 DIAGNOSIS — I10 Essential (primary) hypertension: Secondary | ICD-10-CM | POA: Diagnosis not present

## 2013-04-05 DIAGNOSIS — Z880 Allergy status to penicillin: Secondary | ICD-10-CM | POA: Diagnosis not present

## 2013-04-05 DIAGNOSIS — L089 Local infection of the skin and subcutaneous tissue, unspecified: Secondary | ICD-10-CM | POA: Diagnosis not present

## 2013-04-05 DIAGNOSIS — M25579 Pain in unspecified ankle and joints of unspecified foot: Secondary | ICD-10-CM | POA: Diagnosis not present

## 2013-04-05 DIAGNOSIS — L03119 Cellulitis of unspecified part of limb: Secondary | ICD-10-CM | POA: Diagnosis not present

## 2013-04-05 DIAGNOSIS — Z7982 Long term (current) use of aspirin: Secondary | ICD-10-CM | POA: Diagnosis not present

## 2013-04-05 DIAGNOSIS — E669 Obesity, unspecified: Secondary | ICD-10-CM | POA: Diagnosis not present

## 2013-04-05 DIAGNOSIS — L0390 Cellulitis, unspecified: Secondary | ICD-10-CM

## 2013-04-05 DIAGNOSIS — Z8669 Personal history of other diseases of the nervous system and sense organs: Secondary | ICD-10-CM | POA: Insufficient documentation

## 2013-04-05 DIAGNOSIS — W57XXXA Bitten or stung by nonvenomous insect and other nonvenomous arthropods, initial encounter: Secondary | ICD-10-CM | POA: Diagnosis not present

## 2013-04-05 MED ORDER — CLINDAMYCIN HCL 150 MG PO CAPS: 300.0000 mg | ORAL_CAPSULE | Freq: Three times a day (TID) | ORAL | Status: DC

## 2013-04-05 MED ORDER — CLINDAMYCIN PHOSPHATE 600 MG/50ML IV SOLN
600.0000 mg | Freq: Once | INTRAVENOUS | Status: AC
  Administered 2013-04-05: 600 mg via INTRAVENOUS
  Filled 2013-04-05: qty 50

## 2013-04-05 NOTE — ED Notes (Signed)
Pt. Stated, It started out with misquito bites and have developed cellulitis. Seen by [facility], and told to come here.

## 2013-04-05 NOTE — ED Provider Notes (Signed)
History    CSN: [id number] Arrival date & time 04/05/13  1437  First MD Initiated Contact with Patient 04/05/13 1635     Chief Complaint  Patient presents with  . IV Medication   (Consider location/radiation/quality/duration/timing/severity/associated sxs/prior Treatment) HPI Patient presents with concern of possible infected wound in the left ankle. Patient states that she received multiple mosquito bites over the past few days, and in the past day has developed increasing erythema about the medial distal ankle.  There is associated pain and pruritus.  There is no new fever, vomiting, diarrhea, chills. The patient states that she has diabetes, is concern about cellulitis. The patient went to urgent care, was referred here for IV antibiotics, which the patient requests.   Past Medical History  Diagnosis Date  . HYPERTENSION, UNSPECIFIED   . HYPERLIPIDEMIA-MIXED   . OBESITY   . GERD   . DM   . CATARACT, RIGHT EYE    Past Surgical History  Procedure Laterality Date  . Appendectomy    . Cholecystectomy    . Tubal ligation     No family history on file. History  Substance Use Topics  . Smoking status: Never Smoker   . Smokeless tobacco: Not on file  . Alcohol Use: No   OB History   Grav Para Term Preterm Abortions TAB SAB Ect Mult Living                 Review of Systems  All other systems reviewed and are negative.    Allergies  Codeine; Erythromycin; Morphine and related; and Penicillins  Home Medications   Current Outpatient Rx  Name  Route  Sig  Dispense  Refill  . aspirin 81 MG tablet   Oral   Take 81 mg by mouth daily.           . CRESTOR 40 MG tablet      TAKE 1 TABLET AT BEDTIME   30 tablet   0     Patient needs to schedule follow up or refill thru ...   . desloratadine (CLARINEX) 5 MG tablet   Oral   Take 5 mg by mouth as needed.           . fluticasone (FLONASE) 50 MCG/ACT nasal spray   Nasal   Place 2 sprays into the nose as  needed.           . gabapentin (NEURONTIN) 300 MG capsule   Oral   Take 300 mg by mouth 3 (three) times daily.           Marland Kitchen ibuprofen (ADVIL,MOTRIN) 200 MG tablet   Oral   Take 200 mg by mouth every 6 (six) hours as needed.           Marland Kitchen losartan (COZAAR) 100 MG tablet      TAKE 1 TABLET EVERY DAY   30 tablet   5   . methocarbamol (ROBAXIN) 500 MG tablet   Oral   Take 500 mg by mouth 2 (two) times daily.           . Multiple Vitamins-Minerals (MULTIVITAMIN WITH MINERALS) tablet   Oral   Take 1 tablet by mouth daily.           . pantoprazole (PROTONIX) 40 MG tablet   Oral   Take 40 mg by mouth daily.           . sitaGLIPtin (JANUVIA) 100 MG tablet   Oral   Take 100 mg  by mouth daily.           Marland Kitchen venlafaxine (EFFEXOR) 75 MG tablet   Oral   Take 75 mg by mouth daily.            BP 143/75  Pulse 89  Temp(Src) 98.1 F (36.7 C) (Oral)  Resp 16  SpO2 98% Physical Exam  Nursing note and vitals reviewed. Constitutional: She is oriented to person, place, and time. She appears well-developed and well-nourished. No distress.  HENT:  Head: Normocephalic and atraumatic.  Eyes: Conjunctivae and EOM are normal.  Cardiovascular: Normal rate, regular rhythm and intact distal pulses.   Pulmonary/Chest: Effort normal and breath sounds normal. No stridor. No respiratory distress.  Abdominal: She exhibits no distension.  Musculoskeletal: She exhibits no edema.  Medial L ankle w multiple <.5cm round lesions w confluent erythema - no edema. Ankle and foot otherwise wnl. Sensation and strength are appropriate  Neurological: She is alert and oriented to person, place, and time. No cranial nerve deficit.  Skin: Skin is warm and dry.  Psychiatric: She has a normal mood and affect.    ED Course  Procedures (including critical care time) Labs Reviewed - No data to display No results found. No diagnosis found.  6:20 PM On repeat exam the patient appears calm, no new  complaints.  Wound appears stable  MDM  This diabetic female presents with concern of increasing erythema about a grouping of likely insect bites.  Patient is afebrile, in no distress, but there is some suspicion of early cellulitis.  Patient started on antibiotics, and after receiving one dose here, with no significant change in her clinical condition, she was discharged in stable condition with next a primary care followup for wound check.  Gerhard Munch, MD 04/05/13 [phone]

## 2013-04-06 DIAGNOSIS — L02419 Cutaneous abscess of limb, unspecified: Secondary | ICD-10-CM | POA: Diagnosis not present

## 2013-04-19 ENCOUNTER — Other Ambulatory Visit: Payer: Self-pay | Admitting: Cardiology

## 2013-07-05 DIAGNOSIS — N189 Chronic kidney disease, unspecified: Secondary | ICD-10-CM | POA: Diagnosis not present

## 2013-07-05 DIAGNOSIS — E1129 Type 2 diabetes mellitus with other diabetic kidney complication: Secondary | ICD-10-CM | POA: Diagnosis not present

## 2013-07-08 DIAGNOSIS — E1129 Type 2 diabetes mellitus with other diabetic kidney complication: Secondary | ICD-10-CM | POA: Diagnosis not present

## 2013-07-08 DIAGNOSIS — N189 Chronic kidney disease, unspecified: Secondary | ICD-10-CM | POA: Diagnosis not present

## 2013-07-08 DIAGNOSIS — Z23 Encounter for immunization: Secondary | ICD-10-CM | POA: Diagnosis not present

## 2013-07-08 DIAGNOSIS — E669 Obesity, unspecified: Secondary | ICD-10-CM | POA: Diagnosis not present

## 2013-07-21 DIAGNOSIS — Z23 Encounter for immunization: Secondary | ICD-10-CM | POA: Diagnosis not present

## 2013-08-05 DIAGNOSIS — Z1231 Encounter for screening mammogram for malignant neoplasm of breast: Secondary | ICD-10-CM | POA: Diagnosis not present

## 2013-10-06 DIAGNOSIS — N183 Chronic kidney disease, stage 3 unspecified: Secondary | ICD-10-CM | POA: Diagnosis not present

## 2013-10-06 DIAGNOSIS — I10 Essential (primary) hypertension: Secondary | ICD-10-CM | POA: Diagnosis not present

## 2013-10-06 DIAGNOSIS — E782 Mixed hyperlipidemia: Secondary | ICD-10-CM | POA: Diagnosis not present

## 2013-10-06 DIAGNOSIS — E1129 Type 2 diabetes mellitus with other diabetic kidney complication: Secondary | ICD-10-CM | POA: Diagnosis not present

## 2013-10-06 DIAGNOSIS — K219 Gastro-esophageal reflux disease without esophagitis: Secondary | ICD-10-CM | POA: Diagnosis not present

## 2013-10-06 DIAGNOSIS — G609 Hereditary and idiopathic neuropathy, unspecified: Secondary | ICD-10-CM | POA: Diagnosis not present

## 2013-10-06 DIAGNOSIS — E1149 Type 2 diabetes mellitus with other diabetic neurological complication: Secondary | ICD-10-CM | POA: Diagnosis not present

## 2013-10-07 ENCOUNTER — Other Ambulatory Visit: Payer: Self-pay | Admitting: Cardiology

## 2013-10-20 DIAGNOSIS — Z01419 Encounter for gynecological examination (general) (routine) without abnormal findings: Secondary | ICD-10-CM | POA: Diagnosis not present

## 2014-01-10 DIAGNOSIS — Z6831 Body mass index (BMI) 31.0-31.9, adult: Secondary | ICD-10-CM | POA: Diagnosis not present

## 2014-01-10 DIAGNOSIS — E669 Obesity, unspecified: Secondary | ICD-10-CM | POA: Diagnosis not present

## 2014-01-10 DIAGNOSIS — E1129 Type 2 diabetes mellitus with other diabetic kidney complication: Secondary | ICD-10-CM | POA: Diagnosis not present

## 2014-01-10 DIAGNOSIS — N189 Chronic kidney disease, unspecified: Secondary | ICD-10-CM | POA: Diagnosis not present

## 2014-01-27 ENCOUNTER — Other Ambulatory Visit: Payer: Self-pay | Admitting: Dermatology

## 2014-01-27 DIAGNOSIS — D485 Neoplasm of uncertain behavior of skin: Secondary | ICD-10-CM | POA: Diagnosis not present

## 2014-02-17 DIAGNOSIS — E119 Type 2 diabetes mellitus without complications: Secondary | ICD-10-CM | POA: Diagnosis not present

## 2014-02-17 DIAGNOSIS — H43819 Vitreous degeneration, unspecified eye: Secondary | ICD-10-CM | POA: Diagnosis not present

## 2014-02-17 DIAGNOSIS — H3530 Unspecified macular degeneration: Secondary | ICD-10-CM | POA: Diagnosis not present

## 2014-02-17 DIAGNOSIS — Z961 Presence of intraocular lens: Secondary | ICD-10-CM | POA: Diagnosis not present

## 2014-03-02 ENCOUNTER — Other Ambulatory Visit: Payer: Self-pay | Admitting: Dermatology

## 2014-03-02 DIAGNOSIS — D235 Other benign neoplasm of skin of trunk: Secondary | ICD-10-CM | POA: Diagnosis not present

## 2014-03-02 DIAGNOSIS — D485 Neoplasm of uncertain behavior of skin: Secondary | ICD-10-CM | POA: Diagnosis not present

## 2014-03-21 DIAGNOSIS — R197 Diarrhea, unspecified: Secondary | ICD-10-CM | POA: Diagnosis not present

## 2014-03-21 DIAGNOSIS — D649 Anemia, unspecified: Secondary | ICD-10-CM | POA: Diagnosis not present

## 2014-03-21 DIAGNOSIS — Z0000 Encounter for general adult medical examination without abnormal findings: Secondary | ICD-10-CM | POA: Diagnosis not present

## 2014-03-21 DIAGNOSIS — E782 Mixed hyperlipidemia: Secondary | ICD-10-CM | POA: Diagnosis not present

## 2014-03-21 DIAGNOSIS — N183 Chronic kidney disease, stage 3 unspecified: Secondary | ICD-10-CM | POA: Diagnosis not present

## 2014-03-21 DIAGNOSIS — I10 Essential (primary) hypertension: Secondary | ICD-10-CM | POA: Diagnosis not present

## 2014-03-21 DIAGNOSIS — Z23 Encounter for immunization: Secondary | ICD-10-CM | POA: Diagnosis not present

## 2014-03-21 DIAGNOSIS — E1129 Type 2 diabetes mellitus with other diabetic kidney complication: Secondary | ICD-10-CM | POA: Diagnosis not present

## 2014-03-21 DIAGNOSIS — Z1331 Encounter for screening for depression: Secondary | ICD-10-CM | POA: Diagnosis not present

## 2014-03-21 DIAGNOSIS — K219 Gastro-esophageal reflux disease without esophagitis: Secondary | ICD-10-CM | POA: Diagnosis not present

## 2014-04-29 DIAGNOSIS — Z79899 Other long term (current) drug therapy: Secondary | ICD-10-CM | POA: Diagnosis not present

## 2014-04-29 DIAGNOSIS — R197 Diarrhea, unspecified: Secondary | ICD-10-CM | POA: Diagnosis not present

## 2014-04-29 DIAGNOSIS — N189 Chronic kidney disease, unspecified: Secondary | ICD-10-CM | POA: Diagnosis not present

## 2014-04-29 DIAGNOSIS — E1129 Type 2 diabetes mellitus with other diabetic kidney complication: Secondary | ICD-10-CM | POA: Diagnosis not present

## 2014-04-29 DIAGNOSIS — R61 Generalized hyperhidrosis: Secondary | ICD-10-CM | POA: Diagnosis not present

## 2014-04-29 DIAGNOSIS — R232 Flushing: Secondary | ICD-10-CM | POA: Diagnosis not present

## 2014-04-29 DIAGNOSIS — D649 Anemia, unspecified: Secondary | ICD-10-CM | POA: Diagnosis not present

## 2014-04-29 DIAGNOSIS — E669 Obesity, unspecified: Secondary | ICD-10-CM | POA: Diagnosis not present

## 2014-05-06 DIAGNOSIS — E538 Deficiency of other specified B group vitamins: Secondary | ICD-10-CM | POA: Diagnosis not present

## 2014-05-13 DIAGNOSIS — E538 Deficiency of other specified B group vitamins: Secondary | ICD-10-CM | POA: Diagnosis not present

## 2014-05-20 DIAGNOSIS — E538 Deficiency of other specified B group vitamins: Secondary | ICD-10-CM | POA: Diagnosis not present

## 2014-05-23 DIAGNOSIS — R232 Flushing: Secondary | ICD-10-CM | POA: Diagnosis not present

## 2014-05-23 DIAGNOSIS — R197 Diarrhea, unspecified: Secondary | ICD-10-CM | POA: Diagnosis not present

## 2014-05-27 DIAGNOSIS — E538 Deficiency of other specified B group vitamins: Secondary | ICD-10-CM | POA: Diagnosis not present

## 2014-06-29 DIAGNOSIS — E538 Deficiency of other specified B group vitamins: Secondary | ICD-10-CM | POA: Diagnosis not present

## 2014-07-29 DIAGNOSIS — E538 Deficiency of other specified B group vitamins: Secondary | ICD-10-CM | POA: Diagnosis not present

## 2014-08-01 DIAGNOSIS — E538 Deficiency of other specified B group vitamins: Secondary | ICD-10-CM | POA: Diagnosis not present

## 2014-08-04 DIAGNOSIS — Z23 Encounter for immunization: Secondary | ICD-10-CM | POA: Diagnosis not present

## 2014-08-04 DIAGNOSIS — E119 Type 2 diabetes mellitus without complications: Secondary | ICD-10-CM | POA: Diagnosis not present

## 2014-08-04 DIAGNOSIS — D509 Iron deficiency anemia, unspecified: Secondary | ICD-10-CM | POA: Diagnosis not present

## 2014-08-24 ENCOUNTER — Other Ambulatory Visit: Payer: Self-pay | Admitting: Dermatology

## 2014-08-24 DIAGNOSIS — D1801 Hemangioma of skin and subcutaneous tissue: Secondary | ICD-10-CM | POA: Diagnosis not present

## 2014-08-24 DIAGNOSIS — D485 Neoplasm of uncertain behavior of skin: Secondary | ICD-10-CM | POA: Diagnosis not present

## 2014-08-24 DIAGNOSIS — L814 Other melanin hyperpigmentation: Secondary | ICD-10-CM | POA: Diagnosis not present

## 2014-08-24 DIAGNOSIS — D2272 Melanocytic nevi of left lower limb, including hip: Secondary | ICD-10-CM | POA: Diagnosis not present

## 2014-08-24 DIAGNOSIS — L821 Other seborrheic keratosis: Secondary | ICD-10-CM | POA: Diagnosis not present

## 2014-08-24 DIAGNOSIS — D225 Melanocytic nevi of trunk: Secondary | ICD-10-CM | POA: Diagnosis not present

## 2014-09-01 DIAGNOSIS — E538 Deficiency of other specified B group vitamins: Secondary | ICD-10-CM | POA: Diagnosis not present

## 2014-10-03 DIAGNOSIS — E780 Pure hypercholesterolemia: Secondary | ICD-10-CM | POA: Diagnosis not present

## 2014-10-03 DIAGNOSIS — E119 Type 2 diabetes mellitus without complications: Secondary | ICD-10-CM | POA: Diagnosis not present

## 2014-10-03 DIAGNOSIS — K219 Gastro-esophageal reflux disease without esophagitis: Secondary | ICD-10-CM | POA: Diagnosis not present

## 2014-10-03 DIAGNOSIS — I10 Essential (primary) hypertension: Secondary | ICD-10-CM | POA: Diagnosis not present

## 2014-10-03 DIAGNOSIS — N183 Chronic kidney disease, stage 3 (moderate): Secondary | ICD-10-CM | POA: Diagnosis not present

## 2014-10-03 DIAGNOSIS — H9319 Tinnitus, unspecified ear: Secondary | ICD-10-CM | POA: Diagnosis not present

## 2014-10-03 DIAGNOSIS — E538 Deficiency of other specified B group vitamins: Secondary | ICD-10-CM | POA: Diagnosis not present

## 2014-10-03 DIAGNOSIS — R079 Chest pain, unspecified: Secondary | ICD-10-CM | POA: Diagnosis not present

## 2014-10-13 ENCOUNTER — Other Ambulatory Visit: Payer: Self-pay | Admitting: Gastroenterology

## 2014-10-13 DIAGNOSIS — D509 Iron deficiency anemia, unspecified: Secondary | ICD-10-CM | POA: Diagnosis not present

## 2014-10-24 DIAGNOSIS — Z1231 Encounter for screening mammogram for malignant neoplasm of breast: Secondary | ICD-10-CM | POA: Diagnosis not present

## 2014-10-24 DIAGNOSIS — Z01419 Encounter for gynecological examination (general) (routine) without abnormal findings: Secondary | ICD-10-CM | POA: Diagnosis not present

## 2014-10-24 DIAGNOSIS — N958 Other specified menopausal and perimenopausal disorders: Secondary | ICD-10-CM | POA: Diagnosis not present

## 2014-10-27 ENCOUNTER — Encounter: Payer: Self-pay | Admitting: Cardiology

## 2014-10-27 ENCOUNTER — Ambulatory Visit (INDEPENDENT_AMBULATORY_CARE_PROVIDER_SITE_OTHER): Payer: Medicare Other | Admitting: Cardiology

## 2014-10-27 VITALS — BP 120/76 | HR 101 | Ht 63.0 in | Wt 175.0 lb

## 2014-10-27 DIAGNOSIS — E7800 Pure hypercholesterolemia, unspecified: Secondary | ICD-10-CM | POA: Insufficient documentation

## 2014-10-27 DIAGNOSIS — R079 Chest pain, unspecified: Secondary | ICD-10-CM | POA: Insufficient documentation

## 2014-10-27 DIAGNOSIS — I10 Essential (primary) hypertension: Secondary | ICD-10-CM

## 2014-10-27 NOTE — Progress Notes (Signed)
Cardiology Office Note   Date:  10/27/2014   ID:  Tina Briggs, DOB 01-22-1946, MRN [id number]  PCP:  Wenda Low, MD  Cardiologist:   Darlin Coco, MD   No chief complaint on file.     History of Present Illness: Tina Briggs is a 69 y.o. female who presents for evaluation of chest pain.  She has been having intermittent episodes of chest discomfort at rest since early November.  She saw her PCP in early January and was referred for further evaluation.  She describes the episodes as feeling heavy like a "thud".  The episodes can occur at any time of day or night.  They do not appear to BE correlated with exertion.  She thinks that they may be related to emotional stress.  She has been under more stress since early November when her grandson entered the [employer].  He does not have any history of known ischemic heart disease.  She recalls having a stress test about 12 years ago from Dr. wall.  Those records are not currently available.  States that the exercise test was normal at that time.  The patient is diabetic.  She has a history of high blood pressure.  She has a history of hypercholesterolemia.  Her parents are deceased.  Her father died of Alzheimer's at age 63.  Her mother had previous CABG.  She died at age 54.    Past Medical History  Diagnosis Date  . HYPERTENSION, UNSPECIFIED   . HYPERLIPIDEMIA-MIXED   . OBESITY   . GERD   . DM   . CATARACT, RIGHT EYE     Past Surgical History  Procedure Laterality Date  . Appendectomy    . Cholecystectomy    . Tubal ligation       Current Outpatient Prescriptions  Medication Sig Dispense Refill  . CRESTOR 40 MG tablet TAKE 1 TABLET AT BEDTIME 90 tablet 1  . glimepiride (AMARYL) 2 MG tablet Take 1 mg by mouth daily before breakfast.    . ibuprofen (ADVIL,MOTRIN) 200 MG tablet Take 200 mg by mouth every 6 (six) hours as needed for pain, fever or headache.     . losartan (COZAAR) 100 MG tablet Take 100 mg  by mouth daily.    . metFORMIN (GLUCOPHAGE) 500 MG tablet Take 500 mg by mouth 2 (two) times daily with a meal.     . pantoprazole (PROTONIX) 40 MG tablet Take 40 mg by mouth daily.      . polyethylene glycol-electrolytes (NULYTELY/GOLYTELY) 420 G solution     . rosuvastatin (CRESTOR) 40 MG tablet Take 40 mg by mouth daily.    Marland Kitchen venlafaxine XR (EFFEXOR-XR) 75 MG 24 hr capsule Take 75 mg by mouth daily.    Marland Kitchen VICTOZA 18 MG/3ML SOPN Inject as directed daily.      No current facility-administered medications for this visit.    Allergies:   Codeine; Azithromycin; Erythromycin; Morphine and related; Penicillins; and Septra    Social History:  The patient  reports that she has never smoked. She does not have any smokeless tobacco history on file. She reports that she does not drink alcohol or use illicit drugs.   Family History:  The patient's family history is not on file. the patient's mother had CABG and had had a prior heart attack and died at age 19.   ROS:  Please see the history of present illness.   Otherwise, review of systems are positive for chronic anemia.  She is on monthly B12 shots.  Also has a history of anxiety and is on Effexor..   All other systems are reviewed and negative.    PHYSICAL EXAM: VS:  BP 120/76 mmHg  Pulse 101  Ht 5\' 3"  (1.6 m)  Wt 175 lb (79.379 kg)  BMI 31.01 kg/m2 , BMI Body mass index is 31.01 kg/(m^2). GEN: Well nourished, well developed, in no acute distress HEENT: normal Neck: no JVD, carotid bruits, or masses Cardiac: RRR; no murmurs, rubs, or gallops,no edema  Respiratory:  clear to auscultation bilaterally, normal work of breathing GI: soft, nontender, nondistended, + BS MS: no deformity or atrophy Skin: warm and dry, no rash Neuro:  Strength and sensation are intact Psych: euthymic mood, full affect   EKG:  EKG is ordered today. The ekg ordered today demonstrates sinus tachycardia at 101 bpm and there is left anterior fascicular block which  is unchanged since 06/24/11   Recent Labs: No results found for requested labs within last 365 days.    Lipid Panel No results found for: CHOL, TRIG, HDL, CHOLHDL, VLDL, LDLCALC, LDLDIRECT    Wt Readings from Last 3 Encounters:  10/27/14 175 lb (79.379 kg)  06/24/11 194 lb (87.998 kg)  03/13/10 187 lb (84.823 kg)      Other studies Reviewed:    ASSESSMENT AND PLAN:  1.  Intermittent chest discomfort for past 2 months. 2.  Diabetes mellitus, adult onset 3.  Essential hypertension without heart failure 4.  Hypercholesterolemia 5.  History of anemia, on monthly B12 shots 6.  Family history of ischemic heart disease with her mother having had CABG  Plan: We will have the patient return for a walking Lexiscan Myoview stress test to evaluate her chest pain further   Current medicines are reviewed at length with the patient today.  The patient does not have concerns regarding medicines.  The following changes have been made:  no change  Labs/ tests ordered today include: Walking Lexiscan Myoview stress test  Orders Placed This Encounter  Procedures  . Myocardial Perfusion Imaging  . EKG 12-Lead     Disposition:  Recheck here on an as-needed basis.  Many thanks to Dr. Lysle Rubens for allowing [place] to share in this pleasant lady's care.  We will be in touch regarding the results of the stress test.  Signed, Darlin Coco, MD  10/27/2014 2:21 PM    [facility] [place] [place], [place], [place]  [address] Phone: [phone]; Fax: [phone]

## 2014-10-27 NOTE — Patient Instructions (Signed)
Your physician recommends that you continue on your current medications as directed. Please refer to the Current Medication list given to you today.  Your physician has requested that you have a lexiscan myoview. For further information please visit [email or web address]. Please follow instruction sheet, as given. WALKING LEXISCAN  FOLLOW UP AS NEEDED

## 2014-11-01 ENCOUNTER — Encounter: Payer: Self-pay | Admitting: Cardiology

## 2014-11-01 ENCOUNTER — Ambulatory Visit (HOSPITAL_COMMUNITY): Payer: Medicare Other | Attending: Cardiology | Admitting: Radiology

## 2014-11-01 DIAGNOSIS — E7800 Pure hypercholesterolemia, unspecified: Secondary | ICD-10-CM

## 2014-11-01 DIAGNOSIS — I10 Essential (primary) hypertension: Secondary | ICD-10-CM | POA: Diagnosis not present

## 2014-11-01 DIAGNOSIS — E785 Hyperlipidemia, unspecified: Secondary | ICD-10-CM | POA: Insufficient documentation

## 2014-11-01 DIAGNOSIS — R079 Chest pain, unspecified: Secondary | ICD-10-CM

## 2014-11-01 DIAGNOSIS — R9431 Abnormal electrocardiogram [ECG] [EKG]: Secondary | ICD-10-CM

## 2014-11-01 DIAGNOSIS — E109 Type 1 diabetes mellitus without complications: Secondary | ICD-10-CM | POA: Insufficient documentation

## 2014-11-01 DIAGNOSIS — Z794 Long term (current) use of insulin: Secondary | ICD-10-CM | POA: Insufficient documentation

## 2014-11-01 MED ORDER — TECHNETIUM TC 99M SESTAMIBI GENERIC - CARDIOLITE
30.0000 | Freq: Once | INTRAVENOUS | Status: AC | PRN
  Administered 2014-11-01: 30 via INTRAVENOUS

## 2014-11-01 MED ORDER — REGADENOSON 0.4 MG/5ML IV SOLN
0.4000 mg | Freq: Once | INTRAVENOUS | Status: AC
  Administered 2014-11-01: 0.4 mg via INTRAVENOUS

## 2014-11-01 MED ORDER — TECHNETIUM TC 99M SESTAMIBI GENERIC - CARDIOLITE
10.0000 | Freq: Once | INTRAVENOUS | Status: AC | PRN
  Administered 2014-11-01: 10 via INTRAVENOUS

## 2014-11-01 NOTE — Progress Notes (Signed)
[facility] [address] [place], [place] [address] [phone]    Cardiology Nuclear Med Study  Tina Briggs is a 69 y.o. female     MRN : [id number]     DOB: [birth year]  Procedure Date: 11/01/2014  Nuclear Med Background Indication for Stress Test:  Evaluation for Ischemia and Abnormal EKG History:  '03 KGM:WNUUVO per patient;no report Cardiac Risk Factors: Hypertension, IDDM,and Lipids  Symptoms:  Chest Pain at Rest and with Exertion (last date of chest discomfort month ago)   Nuclear Pre-Procedure Caffeine/Decaff Intake:  None> 12 hrs NPO After: 7:45am   Lungs:  clear O2 Sat: 95% on room air. IV 0.9% NS with Angio Cath:  22g  IV Site: L Antecubital x 1, tolerated well IV Started by:  Irven Baltimore, RN  Chest Size (in):  40 Cup Size: DD  Height: 5\' 3"  (1.6 m)  Weight:  177 lb (80.287 kg)  BMI:  Body mass index is 31.36 kg/(m^2). Tech Comments:  Patient took Amaryl, Glucophage, and Cozaar with breakfast. Irven Baltimore, RN.    Nuclear Med Study 1 or 2 day study: 1 day  Stress Test Type:  Treadmill/Lexiscan  Reading MD: N/A  Order Authorizing Provider:  Darlin Coco, MD  Resting Radionuclide: Technetium 36m Sestamibi  Resting Radionuclide Dose: 11.0 mCi   Stress Radionuclide:  Technetium 15m Sestamibi  Stress Radionuclide Dose: 33.0 mCi           Stress Protocol Rest HR: 82 Stress HR: 131  Rest BP: 126/75 Stress BP: 177/80  Exercise Time (min): 2:00 METS: 1.6   Predicted Max HR: 152 bpm % Max HR: 86.18 bpm Rate Pressure Product: 23187   Dose of Adenosine (mg):  n/a Dose of Lexiscan: 0.4 mg  Dose of Atropine (mg): n/a Dose of Dobutamine: n/a mcg/kg/min (at max HR)  Stress Test Technologist: Matilde Haymaker, RN  Nuclear Technologist:  Earl Many, CNMT     Rest Procedure:  Myocardial perfusion imaging was performed at rest 45 minutes following the intravenous administration of Technetium 15m Sestamibi. Rest ECG: NSR -  Normal EKG  Stress Procedure:  The patient received IV Lexiscan 0.4 mg over 15-seconds with concurrent low level exercise and then Technetium 49m Sestamibi was injected at 30-seconds while the patient continued walking one more minute.  Quantitative spect images were obtained after a 45-minute delay. Stress ECG: No significant change from baseline ECG  QPS Raw Data Images:  Normal; no motion artifact; normal heart/lung ratio. Stress Images:  Normal homogeneous uptake in all areas of the myocardium. Rest Images:  Normal homogeneous uptake in all areas of the myocardium. Subtraction (SDS):  No evidence of ischemia. Transient Ischemic Dilatation (Normal <1.22):  0.92 Lung/Heart Ratio (Normal <0.45):  0.21  Quantitative Gated Spect Images QGS EDV:  51 ml QGS ESV:  13 ml  Impression Exercise Capacity:  Lexiscan with no exercise. BP Response:  Normal blood pressure response. Clinical Symptoms:  No significant symptoms noted. ECG Impression:  No significant ECG changes with Lexiscan. Comparison with Prior Nuclear Study: No previous nuclear study performed  Overall Impression:  Normal stress nuclear study.  LV Ejection Fraction: 74%.  LV Wall Motion:  Normal Wall Motion   Pixie Casino, MD, [facility] Board Certified in Nuclear Cardiology Attending Cardiologist [place]

## 2014-11-02 ENCOUNTER — Telehealth: Payer: Self-pay | Admitting: *Deleted

## 2014-11-02 NOTE — Telephone Encounter (Signed)
-----   Message from Darlin Coco, MD sent at 11/02/2014  8:01 AM EST ----- Please report.  The stress test is normal.  Left ventricular function is normal.  Continue current medication.  Send report to Dr. Lysle Rubens

## 2014-11-02 NOTE — Telephone Encounter (Signed)
Advised patient

## 2014-11-04 DIAGNOSIS — E538 Deficiency of other specified B group vitamins: Secondary | ICD-10-CM | POA: Diagnosis not present

## 2014-11-16 ENCOUNTER — Ambulatory Visit
Admission: RE | Admit: 2014-11-16 | Discharge: 2014-11-16 | Disposition: A | Discharge status: Home / Self Care | Payer: Medicare Other | Source: Ambulatory Visit | Attending: Internal Medicine | Admitting: Internal Medicine

## 2014-11-16 ENCOUNTER — Other Ambulatory Visit: Payer: Self-pay | Admitting: Internal Medicine

## 2014-11-16 DIAGNOSIS — R1084 Generalized abdominal pain: Secondary | ICD-10-CM | POA: Diagnosis not present

## 2014-11-16 DIAGNOSIS — R1013 Epigastric pain: Secondary | ICD-10-CM | POA: Diagnosis not present

## 2014-11-17 ENCOUNTER — Other Ambulatory Visit: Payer: Self-pay | Admitting: Gastroenterology

## 2014-11-18 ENCOUNTER — Encounter (HOSPITAL_COMMUNITY): Payer: Self-pay | Admitting: *Deleted

## 2014-11-22 ENCOUNTER — Ambulatory Visit (HOSPITAL_COMMUNITY)
Admission: RE | Admit: 2014-11-22 | Discharge: 2014-11-22 | Disposition: A | Discharge status: Home / Self Care | Payer: Medicare Other | Source: Ambulatory Visit | Attending: Gastroenterology | Admitting: Gastroenterology

## 2014-11-22 ENCOUNTER — Ambulatory Visit (HOSPITAL_COMMUNITY): Payer: Medicare Other | Admitting: Anesthesiology

## 2014-11-22 ENCOUNTER — Encounter (HOSPITAL_COMMUNITY): Payer: Self-pay | Admitting: *Deleted

## 2014-11-22 ENCOUNTER — Encounter (HOSPITAL_COMMUNITY)
Admission: RE | Disposition: A | Discharge status: Home / Self Care | Payer: Self-pay | Source: Ambulatory Visit | Attending: Gastroenterology

## 2014-11-22 DIAGNOSIS — Z7951 Long term (current) use of inhaled steroids: Secondary | ICD-10-CM | POA: Diagnosis not present

## 2014-11-22 DIAGNOSIS — I10 Essential (primary) hypertension: Secondary | ICD-10-CM | POA: Diagnosis not present

## 2014-11-22 DIAGNOSIS — K219 Gastro-esophageal reflux disease without esophagitis: Secondary | ICD-10-CM | POA: Insufficient documentation

## 2014-11-22 DIAGNOSIS — F329 Major depressive disorder, single episode, unspecified: Secondary | ICD-10-CM | POA: Diagnosis not present

## 2014-11-22 DIAGNOSIS — G629 Polyneuropathy, unspecified: Secondary | ICD-10-CM | POA: Insufficient documentation

## 2014-11-22 DIAGNOSIS — D125 Benign neoplasm of sigmoid colon: Secondary | ICD-10-CM | POA: Diagnosis not present

## 2014-11-22 DIAGNOSIS — Z86718 Personal history of other venous thrombosis and embolism: Secondary | ICD-10-CM | POA: Diagnosis not present

## 2014-11-22 DIAGNOSIS — Z6830 Body mass index (BMI) 30.0-30.9, adult: Secondary | ICD-10-CM | POA: Diagnosis not present

## 2014-11-22 DIAGNOSIS — K5730 Diverticulosis of large intestine without perforation or abscess without bleeding: Secondary | ICD-10-CM | POA: Insufficient documentation

## 2014-11-22 DIAGNOSIS — E785 Hyperlipidemia, unspecified: Secondary | ICD-10-CM | POA: Insufficient documentation

## 2014-11-22 DIAGNOSIS — E559 Vitamin D deficiency, unspecified: Secondary | ICD-10-CM | POA: Diagnosis not present

## 2014-11-22 DIAGNOSIS — K449 Diaphragmatic hernia without obstruction or gangrene: Secondary | ICD-10-CM | POA: Insufficient documentation

## 2014-11-22 DIAGNOSIS — E119 Type 2 diabetes mellitus without complications: Secondary | ICD-10-CM | POA: Diagnosis not present

## 2014-11-22 DIAGNOSIS — K5140 Inflammatory polyps of colon without complications: Secondary | ICD-10-CM | POA: Diagnosis not present

## 2014-11-22 DIAGNOSIS — K317 Polyp of stomach and duodenum: Secondary | ICD-10-CM | POA: Insufficient documentation

## 2014-11-22 DIAGNOSIS — Z90710 Acquired absence of both cervix and uterus: Secondary | ICD-10-CM | POA: Diagnosis not present

## 2014-11-22 DIAGNOSIS — K635 Polyp of colon: Secondary | ICD-10-CM | POA: Diagnosis not present

## 2014-11-22 DIAGNOSIS — D509 Iron deficiency anemia, unspecified: Secondary | ICD-10-CM | POA: Diagnosis present

## 2014-11-22 HISTORY — DX: Other specified postprocedural states: R11.2

## 2014-11-22 HISTORY — DX: Major depressive disorder, single episode, unspecified: F32.9

## 2014-11-22 HISTORY — PX: ESOPHAGOGASTRODUODENOSCOPY (EGD) WITH PROPOFOL: SHX5813

## 2014-11-22 HISTORY — DX: Depression, unspecified: F32.A

## 2014-11-22 HISTORY — PX: COLONOSCOPY WITH PROPOFOL: SHX5780

## 2014-11-22 HISTORY — DX: Anemia, unspecified: D64.9

## 2014-11-22 HISTORY — DX: Other specified postprocedural states: Z98.890

## 2014-11-22 SURGERY — ESOPHAGOGASTRODUODENOSCOPY (EGD) WITH PROPOFOL
Anesthesia: Monitor Anesthesia Care

## 2014-11-22 MED ORDER — PROPOFOL 10 MG/ML IV BOLUS
INTRAVENOUS | Status: AC
  Filled 2014-11-22: qty 20

## 2014-11-22 MED ORDER — GLYCOPYRROLATE 0.2 MG/ML IJ SOLN
INTRAMUSCULAR | Status: AC
  Filled 2014-11-22: qty 1

## 2014-11-22 MED ORDER — PROPOFOL 10 MG/ML IV BOLUS
INTRAVENOUS | Status: DC | PRN
  Administered 2014-11-22: 10 mg via INTRAVENOUS
  Administered 2014-11-22 (×2): 20 mg via INTRAVENOUS

## 2014-11-22 MED ORDER — LIDOCAINE HCL (CARDIAC) 20 MG/ML IV SOLN
INTRAVENOUS | Status: AC
  Filled 2014-11-22: qty 5

## 2014-11-22 MED ORDER — PROPOFOL INFUSION 10 MG/ML OPTIME
INTRAVENOUS | Status: DC | PRN
  Administered 2014-11-22: 160 ug/kg/min via INTRAVENOUS

## 2014-11-22 MED ORDER — GLYCOPYRROLATE 0.2 MG/ML IJ SOLN
INTRAMUSCULAR | Status: DC | PRN
  Administered 2014-11-22: 0.2 mg via INTRAVENOUS

## 2014-11-22 MED ORDER — LIDOCAINE HCL (CARDIAC) 20 MG/ML IV SOLN
INTRAVENOUS | Status: DC | PRN
  Administered 2014-11-22: 50 mg via INTRAVENOUS

## 2014-11-22 MED ORDER — LACTATED RINGERS IV SOLN
INTRAVENOUS | Status: DC
  Administered 2014-11-22: 1000 mL via INTRAVENOUS

## 2014-11-22 MED ORDER — LACTATED RINGERS IV SOLN: INTRAVENOUS | Status: DC

## 2014-11-22 MED ORDER — SODIUM CHLORIDE 0.9 % IV SOLN: INTRAVENOUS | Status: DC

## 2014-11-22 SURGICAL SUPPLY — 25 items

## 2014-11-22 NOTE — Transfer of Care (Signed)
Immediate Anesthesia Transfer of Care Note  Patient: Tina Briggs  Procedure(s) Performed: Procedure(s): ESOPHAGOGASTRODUODENOSCOPY (EGD) WITH PROPOFOL (N/A) COLONOSCOPY WITH PROPOFOL (N/A)  Patient Location: Endo Recovery  Anesthesia Type:MAC  Level of Consciousness: Patient easily awoken, sedated, comfortable, cooperative, following commands, responds to stimulation.   Airway & Oxygen Therapy: Patient spontaneously breathing, ventilating well, oxygen via simple oxygen mask.  Post-op Assessment: Report given to PACU RN, vital signs reviewed and stable, moving all extremities.   Post vital signs: Reviewed and stable.  Complications: No apparent anesthesia complications

## 2014-11-22 NOTE — Op Note (Signed)
Problem: Iron deficiency (low serum ferritin with normal hemoglobin), vitamin B 12 deficiency? Pernicious anemia, vitamin D deficiency, normal screening colonoscopy performed 09/27/2010, normal esophagogastroduodenoscopy performed 07/04/2011, screen for H. pylori gastritis negative by 2012 gastric biopsies and February 2016 H. pylori serology, tissue transglutaminase antibody normal, chronic aspirin and pantoprazole therapy.  Endoscopist: Earle Gell  Premedication: Propofol administered by anesthesia  Procedure: Diagnostic esophagogastroduodenoscopy The patient was placed in the left lateral decubitus position. The Pentax gastroscope was passed through the posterior hypopharynx into the proximal esophagus without difficulty. I did not visualize the vocal cords.  Esophagoscopy: The proximal, mid, and lower segments of the esophageal mucosa appeared normal. The squamocolumnar junction was regular in appearance and noted at 35 cm from the incisor teeth.  Gastroscopy: There was a small hiatal hernia. Retroflexed view of the gastric cardia and fundus was normal. The gastric body appeared normal. In the prepyloric gastric antrum, two 4 mm sessile polyps were present. Biopsy was performed.  Duodenoscopy: The duodenal bulb and descending duodenum appeared normal. Four biopsies were performed from the second portion of the duodenum and a biopsy was performed from the duodenal bulb to look for villous atrophy associated with celiac disease.  Assessment: Normal esophagogastroduodenoscopy except for the presence of 2 small inflammatory appearing polyps in the prepyloric gastric antrum biopsied. Screen for villous atrophy associated with celiac disease pending  Procedure: Diagnostic colonoscopy Anal inspection and digital rectal exam were normal. The Pentax pediatric colonoscope was introduced into the rectum and advanced to the cecum. A normal-appearing appendiceal orifice was identified. A normal-appearing  but prominent ileocecal valve was easily identified. Colonic preparation for the exam today was good.  Rectum. Normal. Retroflexed view of the distal rectum normal  Sigmoid colon. Colonic diverticulosis. From the distal sigmoid colon a 5 mm sessile polyp was removed with the cold snare  Descending colon. Normal  Splenic flexure. Normal  Transverse colon. Normal  Hepatic flexure. Normal  Ascending colon. Normal  Cecum and ileocecal valve. Normal  Assessment:  #1. Left colonic diverticulosis  #2. A small polyp was removed from the sigmoid colon with the cold snare  Recommendation: If sigmoid colon polyp returns adenomatous pathologically, the patient should undergo a surveillance colonoscopy in 5 years.

## 2014-11-22 NOTE — Anesthesia Postprocedure Evaluation (Signed)
  Anesthesia Post-op Note  Patient: Tina Briggs  Procedure(s) Performed: Procedure(s): ESOPHAGOGASTRODUODENOSCOPY (EGD) WITH PROPOFOL (N/A) COLONOSCOPY WITH PROPOFOL (N/A)  Patient Location: PACU  Anesthesia Type: MAC  Level of Consciousness: awake and alert   Airway and Oxygen Therapy: Patient Spontanous Breathing  Post-op Pain: none  Post-op Assessment: Post-op Vital signs reviewed, Patient's Cardiovascular Status Stable and Respiratory Function Stable  Post-op Vital Signs: Reviewed  Filed Vitals:   11/22/14 1140  BP: 123/72  Pulse: 81  Temp:   Resp: 15    Complications: No apparent anesthesia complications

## 2014-11-22 NOTE — Anesthesia Preprocedure Evaluation (Addendum)
Anesthesia Evaluation  Patient identified by MRN, date of birth, ID band Patient awake    Reviewed: Allergy & Precautions, H&P , NPO status , Patient's Chart, lab work & pertinent test results  History of Anesthesia Complications (+) PONV  Airway Mallampati: II  TM Distance: >3 FB Neck ROM: Full    Dental no notable dental hx. (+) Teeth Intact, Dental Advisory Given   Pulmonary neg pulmonary ROS,  breath sounds clear to auscultation  Pulmonary exam normal       Cardiovascular hypertension, Pt. on medications Rhythm:Regular Rate:Normal     Neuro/Psych Depression negative neurological ROS     GI/Hepatic Neg liver ROS, GERD-  Medicated and Controlled,  Endo/Other  diabetes, Type 2, Oral Hypoglycemic AgentsMorbid obesity  Renal/GU negative Renal ROS  negative genitourinary   Musculoskeletal   Abdominal   Peds  Hematology negative hematology ROS (+)   Anesthesia Other Findings   Reproductive/Obstetrics negative OB ROS                            Anesthesia Physical Anesthesia Plan  ASA: III  Anesthesia Plan: MAC   Post-op Pain Management:    Induction: Intravenous  Airway Management Planned: Simple Face Mask  Additional Equipment:   Intra-op Plan:   Post-operative Plan:   Informed Consent: I have reviewed the patients History and Physical, chart, labs and discussed the procedure including the risks, benefits and alternatives for the proposed anesthesia with the patient or authorized representative who has indicated his/her understanding and acceptance.   Dental advisory given  Plan Discussed with: CRNA  Anesthesia Plan Comments:         Anesthesia Quick Evaluation

## 2014-11-22 NOTE — H&P (Signed)
  Problem: Iron deficiency (low serum ferritin and low normal hemoglobin) on aspirin and pantoprazole. Vitamin B 12 deficiency ? Pernicious anemia. 09/27/2010 normal surveillance colonoscopy. 07/04/2011 normal esophagogastroduodenoscopy  History: The patient is a 69 year old female born 1945-12-16. She underwent a normal surveillance colonoscopy in December 2011 and normal esophagogastroduodenoscopy in October 2012. There was retained solid food in the stomach at her October 2012 esophagogastroduodenoscopy suggesting gastroparesis.  The patient has unexplained iron deficiency associated with a normal hemoglobin. She chronically takes proton pump inhibitor therapy to prevent gastroesophageal reflux.  She denies hematemesis, hemoptysis, hematochezia, hematuria, or the passage of melenic stool.  Differential diagnosis would include impaired duodenal iron absorption (secondary to pernicious anemia, chronic proton pump inhibitor therapy, celiac disease, or H. pylori gastritis) and chronic gastrointestinal bleeding secondary to chronic aspirin use   The patient is scheduled to undergo diagnostic esophagogastroduodenoscopy and colonoscopy.  Past medical history: Hypertension since 1998. Hypercholesterolemia. Depression. Type 2 diabetes mellitus. Allergic rhinitis. Neuropathy following back surgery. Right hip surgery. DVT following total abdominal hysterectomy. Fatty appearing liver. Microscopic hematuria. Plantar fasciitis. Gastroesophageal reflux. Vitamin D deficiency. Vitamin B 12 deficiency? Pernicious anemia. TAH-BSO. Benign breast biopsy. Tonsillectomy. Lipoma removed from the thigh. Right ganglion cyst removed from the hand and 1951. Cholecystectomy. Lumbar disc surgery. Appendectomy.  Exam: The patient is alert and lying comfortably on the endoscopy stretcher. Abdomen is soft and nontender to palpation. Lungs are clear to auscultation. Cardiac exam reveals a regular rhythm.  Plan: Diagnostic  esophagogastroduodenoscopy and colonoscopy.

## 2014-11-23 ENCOUNTER — Encounter (HOSPITAL_COMMUNITY): Payer: Self-pay | Admitting: Gastroenterology

## 2014-11-23 LAB — GLUCOSE, CAPILLARY: GLUCOSE-CAPILLARY: 119 mg/dL — AB (ref 70–99)

## 2014-12-05 DIAGNOSIS — D81818 Other biotin-dependent carboxylase deficiency: Secondary | ICD-10-CM | POA: Diagnosis not present

## 2014-12-05 DIAGNOSIS — R109 Unspecified abdominal pain: Secondary | ICD-10-CM | POA: Diagnosis not present

## 2015-01-05 DIAGNOSIS — E538 Deficiency of other specified B group vitamins: Secondary | ICD-10-CM | POA: Diagnosis not present

## 2015-01-16 DIAGNOSIS — D509 Iron deficiency anemia, unspecified: Secondary | ICD-10-CM | POA: Diagnosis not present

## 2015-02-07 DIAGNOSIS — E119 Type 2 diabetes mellitus without complications: Secondary | ICD-10-CM | POA: Diagnosis not present

## 2015-02-20 DIAGNOSIS — Z961 Presence of intraocular lens: Secondary | ICD-10-CM | POA: Diagnosis not present

## 2015-02-20 DIAGNOSIS — E119 Type 2 diabetes mellitus without complications: Secondary | ICD-10-CM | POA: Diagnosis not present

## 2015-02-20 DIAGNOSIS — H3531 Nonexudative age-related macular degeneration: Secondary | ICD-10-CM | POA: Diagnosis not present

## 2015-02-20 DIAGNOSIS — H43813 Vitreous degeneration, bilateral: Secondary | ICD-10-CM | POA: Diagnosis not present

## 2015-03-18 DIAGNOSIS — J320 Chronic maxillary sinusitis: Secondary | ICD-10-CM | POA: Diagnosis not present

## 2015-04-28 DIAGNOSIS — I10 Essential (primary) hypertension: Secondary | ICD-10-CM | POA: Diagnosis not present

## 2015-04-28 DIAGNOSIS — Z1389 Encounter for screening for other disorder: Secondary | ICD-10-CM | POA: Diagnosis not present

## 2015-04-28 DIAGNOSIS — N183 Chronic kidney disease, stage 3 (moderate): Secondary | ICD-10-CM | POA: Diagnosis not present

## 2015-04-28 DIAGNOSIS — D509 Iron deficiency anemia, unspecified: Secondary | ICD-10-CM | POA: Diagnosis not present

## 2015-04-28 DIAGNOSIS — E1122 Type 2 diabetes mellitus with diabetic chronic kidney disease: Secondary | ICD-10-CM | POA: Diagnosis not present

## 2015-04-28 DIAGNOSIS — Z0000 Encounter for general adult medical examination without abnormal findings: Secondary | ICD-10-CM | POA: Diagnosis not present

## 2015-04-28 DIAGNOSIS — Z794 Long term (current) use of insulin: Secondary | ICD-10-CM | POA: Diagnosis not present

## 2015-04-28 DIAGNOSIS — E538 Deficiency of other specified B group vitamins: Secondary | ICD-10-CM | POA: Diagnosis not present

## 2015-04-28 DIAGNOSIS — E559 Vitamin D deficiency, unspecified: Secondary | ICD-10-CM | POA: Diagnosis not present

## 2015-04-28 DIAGNOSIS — K760 Fatty (change of) liver, not elsewhere classified: Secondary | ICD-10-CM | POA: Diagnosis not present

## 2015-04-28 DIAGNOSIS — E782 Mixed hyperlipidemia: Secondary | ICD-10-CM | POA: Diagnosis not present

## 2015-05-10 ENCOUNTER — Other Ambulatory Visit: Payer: Self-pay | Admitting: Gastroenterology

## 2015-05-10 DIAGNOSIS — R109 Unspecified abdominal pain: Secondary | ICD-10-CM | POA: Diagnosis not present

## 2015-05-10 DIAGNOSIS — K838 Other specified diseases of biliary tract: Secondary | ICD-10-CM | POA: Diagnosis not present

## 2015-05-17 ENCOUNTER — Ambulatory Visit
Admission: RE | Admit: 2015-05-17 | Discharge: 2015-05-17 | Disposition: A | Discharge status: Home / Self Care | Payer: Medicare Other | Source: Ambulatory Visit | Attending: Gastroenterology | Admitting: Gastroenterology

## 2015-05-17 DIAGNOSIS — R935 Abnormal findings on diagnostic imaging of other abdominal regions, including retroperitoneum: Secondary | ICD-10-CM | POA: Diagnosis not present

## 2015-05-17 DIAGNOSIS — R109 Unspecified abdominal pain: Secondary | ICD-10-CM

## 2015-05-17 DIAGNOSIS — K8050 Calculus of bile duct without cholangitis or cholecystitis without obstruction: Secondary | ICD-10-CM | POA: Diagnosis not present

## 2015-05-17 DIAGNOSIS — Z9049 Acquired absence of other specified parts of digestive tract: Secondary | ICD-10-CM | POA: Diagnosis not present

## 2015-05-17 MED ORDER — GADOBENATE DIMEGLUMINE 529 MG/ML IV SOLN
15.0000 mL | Freq: Once | INTRAVENOUS | Status: AC | PRN
  Administered 2015-05-17: 15 mL via INTRAVENOUS

## 2015-05-18 DIAGNOSIS — K831 Obstruction of bile duct: Secondary | ICD-10-CM | POA: Diagnosis not present

## 2015-05-18 DIAGNOSIS — K8020 Calculus of gallbladder without cholecystitis without obstruction: Secondary | ICD-10-CM | POA: Diagnosis not present

## 2015-05-18 DIAGNOSIS — K317 Polyp of stomach and duodenum: Secondary | ICD-10-CM | POA: Diagnosis not present

## 2015-05-24 ENCOUNTER — Encounter (HOSPITAL_COMMUNITY): Payer: Self-pay | Admitting: *Deleted

## 2015-05-24 ENCOUNTER — Inpatient Hospital Stay (HOSPITAL_COMMUNITY)
Admission: EM | Admit: 2015-05-24 | Discharge: 2015-05-27 | DRG: 445 | Disposition: A | Discharge status: Home / Self Care | Payer: Medicare Other | Attending: Internal Medicine | Admitting: Internal Medicine

## 2015-05-24 DIAGNOSIS — R945 Abnormal results of liver function studies: Secondary | ICD-10-CM | POA: Diagnosis not present

## 2015-05-24 DIAGNOSIS — R1013 Epigastric pain: Secondary | ICD-10-CM | POA: Diagnosis not present

## 2015-05-24 DIAGNOSIS — Z79899 Other long term (current) drug therapy: Secondary | ICD-10-CM

## 2015-05-24 DIAGNOSIS — R1011 Right upper quadrant pain: Secondary | ICD-10-CM | POA: Diagnosis not present

## 2015-05-24 DIAGNOSIS — F329 Major depressive disorder, single episode, unspecified: Secondary | ICD-10-CM | POA: Diagnosis present

## 2015-05-24 DIAGNOSIS — E7800 Pure hypercholesterolemia, unspecified: Secondary | ICD-10-CM | POA: Diagnosis present

## 2015-05-24 DIAGNOSIS — N183 Chronic kidney disease, stage 3 (moderate): Secondary | ICD-10-CM | POA: Diagnosis present

## 2015-05-24 DIAGNOSIS — E1169 Type 2 diabetes mellitus with other specified complication: Secondary | ICD-10-CM

## 2015-05-24 DIAGNOSIS — K8051 Calculus of bile duct without cholangitis or cholecystitis with obstruction: Principal | ICD-10-CM | POA: Diagnosis present

## 2015-05-24 DIAGNOSIS — R109 Unspecified abdominal pain: Secondary | ICD-10-CM | POA: Diagnosis not present

## 2015-05-24 DIAGNOSIS — Z880 Allergy status to penicillin: Secondary | ICD-10-CM | POA: Diagnosis not present

## 2015-05-24 DIAGNOSIS — I959 Hypotension, unspecified: Secondary | ICD-10-CM | POA: Diagnosis not present

## 2015-05-24 DIAGNOSIS — G8929 Other chronic pain: Secondary | ICD-10-CM | POA: Diagnosis present

## 2015-05-24 DIAGNOSIS — E669 Obesity, unspecified: Secondary | ICD-10-CM | POA: Diagnosis present

## 2015-05-24 DIAGNOSIS — E785 Hyperlipidemia, unspecified: Secondary | ICD-10-CM | POA: Diagnosis present

## 2015-05-24 DIAGNOSIS — Z6830 Body mass index (BMI) 30.0-30.9, adult: Secondary | ICD-10-CM | POA: Diagnosis not present

## 2015-05-24 DIAGNOSIS — Z9049 Acquired absence of other specified parts of digestive tract: Secondary | ICD-10-CM | POA: Diagnosis present

## 2015-05-24 DIAGNOSIS — I10 Essential (primary) hypertension: Secondary | ICD-10-CM | POA: Diagnosis present

## 2015-05-24 DIAGNOSIS — I129 Hypertensive chronic kidney disease with stage 1 through stage 4 chronic kidney disease, or unspecified chronic kidney disease: Secondary | ICD-10-CM | POA: Diagnosis present

## 2015-05-24 DIAGNOSIS — K449 Diaphragmatic hernia without obstruction or gangrene: Secondary | ICD-10-CM | POA: Diagnosis present

## 2015-05-24 DIAGNOSIS — N179 Acute kidney failure, unspecified: Secondary | ICD-10-CM | POA: Diagnosis not present

## 2015-05-24 DIAGNOSIS — K219 Gastro-esophageal reflux disease without esophagitis: Secondary | ICD-10-CM | POA: Diagnosis present

## 2015-05-24 DIAGNOSIS — R634 Abnormal weight loss: Secondary | ICD-10-CM | POA: Diagnosis present

## 2015-05-24 DIAGNOSIS — R7989 Other specified abnormal findings of blood chemistry: Secondary | ICD-10-CM

## 2015-05-24 DIAGNOSIS — D509 Iron deficiency anemia, unspecified: Secondary | ICD-10-CM | POA: Diagnosis present

## 2015-05-24 DIAGNOSIS — I9589 Other hypotension: Secondary | ICD-10-CM | POA: Diagnosis not present

## 2015-05-24 DIAGNOSIS — E119 Type 2 diabetes mellitus without complications: Secondary | ICD-10-CM | POA: Diagnosis not present

## 2015-05-24 DIAGNOSIS — K8050 Calculus of bile duct without cholangitis or cholecystitis without obstruction: Secondary | ICD-10-CM

## 2015-05-24 DIAGNOSIS — R112 Nausea with vomiting, unspecified: Secondary | ICD-10-CM | POA: Diagnosis not present

## 2015-05-24 DIAGNOSIS — Z881 Allergy status to other antibiotic agents status: Secondary | ICD-10-CM

## 2015-05-24 DIAGNOSIS — Z885 Allergy status to narcotic agent status: Secondary | ICD-10-CM | POA: Diagnosis not present

## 2015-05-24 DIAGNOSIS — R748 Abnormal levels of other serum enzymes: Secondary | ICD-10-CM | POA: Diagnosis present

## 2015-05-24 LAB — COMPREHENSIVE METABOLIC PANEL
ALT: 138 U/L — ABNORMAL HIGH (ref 14–54)
AST: 66 U/L — ABNORMAL HIGH (ref 15–41)
Albumin: 4.3 g/dL (ref 3.5–5.0)
Alkaline Phosphatase: 139 U/L — ABNORMAL HIGH (ref 38–126)
Anion gap: 10 (ref 5–15)
BUN: 20 mg/dL (ref 6–20)
CO2: 26 mmol/L (ref 22–32)
Calcium: 10 mg/dL (ref 8.9–10.3)
Chloride: 104 mmol/L (ref 101–111)
Creatinine, Ser: 1.4 mg/dL — ABNORMAL HIGH (ref 0.44–1.00)
GFR calc Af Amer: 44 mL/min — ABNORMAL LOW (ref >60)
GFR calc non Af Amer: 38 mL/min — ABNORMAL LOW (ref >60)
Glucose, Bld: 90 mg/dL (ref 65–99)
Potassium: 4.5 mmol/L (ref 3.5–5.1)
Sodium: 140 mmol/L (ref 135–145)
Total Bilirubin: 0.5 mg/dL (ref 0.3–1.2)
Total Protein: 7.8 g/dL (ref 6.5–8.1)

## 2015-05-24 LAB — CBC
HCT: 38.8 % (ref 36.0–46.0)
Hemoglobin: 12.8 g/dL (ref 12.0–15.0)
MCH: 28.8 pg (ref 26.0–34.0)
MCHC: 33 g/dL (ref 30.0–36.0)
MCV: 87.4 fL (ref 78.0–100.0)
Platelets: 214 10*3/uL (ref 150–400)
RBC: 4.44 MIL/uL (ref 3.87–5.11)
RDW: 14.2 % (ref 11.5–15.5)
WBC: 6.9 10*3/uL (ref 4.0–10.5)

## 2015-05-24 LAB — CBG MONITORING, ED
GLUCOSE-CAPILLARY: 48 mg/dL — AB (ref 65–99)
Glucose-Capillary: 125 mg/dL — ABNORMAL HIGH (ref 65–99)

## 2015-05-24 LAB — LIPASE, BLOOD: Lipase: 36 U/L (ref 22–51)

## 2015-05-24 MED ORDER — ACETAMINOPHEN 325 MG PO TABS
650.0000 mg | ORAL_TABLET | Freq: Four times a day (QID) | ORAL | Status: DC | PRN
  Administered 2015-05-26: 650 mg via ORAL
  Filled 2015-05-24: qty 2

## 2015-05-24 MED ORDER — ONDANSETRON HCL 4 MG/2ML IJ SOLN: 4.0000 mg | Freq: Four times a day (QID) | INTRAMUSCULAR | Status: DC | PRN

## 2015-05-24 MED ORDER — SODIUM CHLORIDE 0.9 % IV SOLN
INTRAVENOUS | Status: DC
  Administered 2015-05-24: 23:00:00 via INTRAVENOUS

## 2015-05-24 MED ORDER — ONDANSETRON HCL 4 MG PO TABS: 4.0000 mg | ORAL_TABLET | Freq: Four times a day (QID) | ORAL | Status: DC | PRN

## 2015-05-24 MED ORDER — PANTOPRAZOLE SODIUM 40 MG IV SOLR
40.0000 mg | Freq: Every day | INTRAVENOUS | Status: DC
  Administered 2015-05-25: 40 mg via INTRAVENOUS
  Filled 2015-05-24 (×2): qty 40

## 2015-05-24 MED ORDER — ALUM & MAG HYDROXIDE-SIMETH 200-200-20 MG/5ML PO SUSP: 30.0000 mL | Freq: Four times a day (QID) | ORAL | Status: DC | PRN

## 2015-05-24 MED ORDER — ACETAMINOPHEN 650 MG RE SUPP: 650.0000 mg | Freq: Four times a day (QID) | RECTAL | Status: DC | PRN

## 2015-05-24 MED ORDER — HYDROMORPHONE HCL 1 MG/ML IJ SOLN: 0.5000 mg | INTRAMUSCULAR | Status: DC | PRN

## 2015-05-24 MED ORDER — DEXTROSE 50 % IV SOLN
INTRAVENOUS | Status: AC
  Administered 2015-05-24: 50 mL
  Filled 2015-05-24: qty 50

## 2015-05-24 NOTE — ED Notes (Signed)
Bed: BF38 Expected date:  Expected time:  Means of arrival:  Comments: [employer]

## 2015-05-24 NOTE — ED Notes (Signed)
Pt complains of nausea, chills since yesterday. Pt had MRI performed showing a blocked bile duct. Pt had blood work performed today and was told to go to the ED due to elevated lipase levels. Pt states she had abdominal pain yesterday, states she has abdominal pain intermittently since February that has gotten worse.

## 2015-05-24 NOTE — H&P (Signed)
[facility] Admission History and Physical       Tina Briggs CYE:[id number] DOB: Jun 22, 1946 DOA: 05/24/2015  Referring physician:  PCP: Wenda Low, MD  Specialists:   Chief Complaint:   HPI: Tina Briggs is a 69 y.o. female with a history of HTN and Hyperlipidemia who presents to the ED with complaints of Nausea and Vomiting and RUQ ABD pain x 2 days but had been intermittent since 10/2014.  She denies any hematemesis, or diarrhea.   She also denies any fevers or chills.    She reports that she was to be scheduled to see Dr. Gillermina Hu for an ERCP as an outpatient, but the pain recurred before the appointment.     Review of Systems:  Constitutional: No Weight Loss, No Weight Gain, Night Sweats, Fevers, Chills, Dizziness, Light Headedness, Fatigue, or Generalized Weakness HEENT: No Headaches, Difficulty Swallowing,Tooth/Dental Problems,Sore Throat,  No Sneezing, Rhinitis, Ear Ache, Nasal Congestion, or Post Nasal Drip,  Cardio-vascular:  No Chest pain, Orthopnea, PND, Edema in Lower Extremities, Anasarca, Dizziness, Palpitations  Resp: No Dyspnea, No DOE, No Productive Cough, No Non-Productive Cough, No Hemoptysis, No Wheezing.    GI: No Heartburn, Indigestion,+Abdominal Pain, Nausea, Vomiting, Diarrhea, Constipation, Hematemesis, Hematochezia, Melena, Change in Bowel Habits,  Loss of Appetite  GU: No Dysuria, No Change in Color of Urine, No Urgency or Urinary Frequency, No Flank pain.  Musculoskeletal: No Joint Pain or Swelling, No Decreased Range of Motion, No Back Pain.  Neurologic: No Syncope, No Seizures, Muscle Weakness, Paresthesia, Vision Disturbance or Loss, No Diplopia, No Vertigo, No Difficulty Walking,  Skin: No Rash or Lesions. Psych: No Change in Mood or Affect, No Depression or Anxiety, No Memory loss, No Confusion, or Hallucinations   Past Medical History  Diagnosis Date  . HYPERTENSION, UNSPECIFIED   . HYPERLIPIDEMIA-MIXED   . OBESITY   .  GERD   . DM   . CATARACT, RIGHT EYE   . Depression   . Anemia   . PONV (postoperative nausea and vomiting)     past history only     Past Surgical History  Procedure Laterality Date  . Appendectomy    . Tubal ligation    . Cholecystectomy      open  . Abdominal hysterectomy    . Back surgery      lumbar '09"microdiscectomy"  . Esophagogastroduodenoscopy (egd) with propofol N/A 11/22/2014    Procedure: ESOPHAGOGASTRODUODENOSCOPY (EGD) WITH PROPOFOL;  Surgeon: Garlan Fair, MD;  Location: [facility];  Service: Endoscopy;  Laterality: N/A;  . Colonoscopy with propofol N/A 11/22/2014    Procedure: COLONOSCOPY WITH PROPOFOL;  Surgeon: Garlan Fair, MD;  Location: [facility];  Service: Endoscopy;  Laterality: N/A;      Prior to Admission medications   Medication Sig Start Date End Date Taking? Authorizing Provider  Cholecalciferol (VITAMIN D PO) Take 1 tablet by mouth every morning.   Yes Historical Provider, MD  CRESTOR 40 MG tablet TAKE 1 TABLET AT BEDTIME Patient taking differently: TAKE 1 TABLET BY MOUTH AT BEDTIME 04/19/13  Yes Renella Cunas, MD  glimepiride (AMARYL) 2 MG tablet Take 1 mg by mouth 2 (two) times daily.    Yes Historical Provider, MD  iron polysaccharides (NIFEREX) 150 MG capsule Take 150 mg by mouth every morning.   Yes Historical Provider, MD  losartan (COZAAR) 100 MG tablet Take 100 mg by mouth every morning.    Yes Historical Provider, MD  metFORMIN (GLUCOPHAGE) 1000 MG tablet Take 1,000 mg  by mouth 2 (two) times daily with a meal.  05/10/15  Yes Historical Provider, MD  pantoprazole (PROTONIX) 40 MG tablet Take 40 mg by mouth every morning.    Yes Historical Provider, MD  venlafaxine XR (EFFEXOR-XR) 75 MG 24 hr capsule Take 75 mg by mouth every morning.    Yes Historical Provider, MD  VICTOZA 18 MG/3ML SOPN Inject 1.2 mg as directed at bedtime.  10/03/14  Yes Historical Provider, MD  vitamin B-12 (CYANOCOBALAMIN) 1000 MCG tablet Take 1,000 mcg by mouth 3  (three) times a week. Take on Sundays, Wednesdays and Fridays.   Yes Historical Provider, MD  fluticasone (FLONASE) 50 MCG/ACT nasal spray Place 2 sprays into both nostrils daily as needed for allergies.     Historical Provider, MD  ibuprofen (ADVIL,MOTRIN) 200 MG tablet Take 200 mg by mouth every 6 (six) hours as needed for pain, fever or headache.     Historical Provider, MD     Allergies  Allergen Reactions  . Codeine Shortness Of Breath  . Tetracyclines & Related   . Azithromycin Itching and Rash  . Erythromycin Itching and Rash  . Morphine And Related Itching and Rash  . Penicillins Itching and Rash  . Septra [Sulfamethoxazole-Trimethoprim] Itching and Rash    Social History:  reports that she has never smoked. She does not have any smokeless tobacco history on file. She reports that she does not drink alcohol or use illicit drugs.    No family history on file.     Physical Exam:  GEN:  Pleasant Obese 69 y.o. Caucasian female examined and in no acute distress; cooperative with exam Filed Vitals:   05/24/15 1429 05/24/15 1813 05/24/15 1954  BP: 124/63 116/64 111/60  Pulse: 99 87 89  Temp: 98 F (36.7 C)    TempSrc: Oral    Resp: 18 16 16   SpO2: 96% 93% 94%   Blood pressure 111/60, pulse 89, temperature 98 F (36.7 C), temperature source Oral, resp. rate 16, SpO2 94 %. PSYCH: She is alert and oriented x4; does not appear anxious does not appear depressed; affect is normal HEENT: Normocephalic and Atraumatic, Mucous membranes pink; PERRLA; EOM intact; Fundi:  Benign;  No scleral icterus, Nares: Patent, Oropharynx: Clear, Fair Dentition,    Neck:  FROM, No Cervical Lymphadenopathy nor Thyromegaly or Carotid Bruit; No JVD; Breasts:: Not examined CHEST WALL: No tenderness CHEST: Normal respiration, clear to auscultation bilaterally HEART: Regular rate and rhythm; no murmurs rubs or gallops BACK: No kyphosis or scoliosis; No CVA tenderness ABDOMEN: Positive Bowel Sounds,  Obese, Soft Non-Tender, No Rebound or Guarding; No Masses, No Organomegaly. Rectal Exam: Not done EXTREMITIES: No Cyanosis, Clubbing, or Edema; No Ulcerations. Genitalia: not examined PULSES: 2+ and symmetric SKIN: Normal hydration no rash or ulceration CNS:  Alert and Oriented x 4, No Focal Deficits Vascular: pulses palpable throughout    Labs on Admission:  Basic Metabolic Panel:  Recent Labs Lab 05/24/15 1520  NA 140  K 4.5  CL 104  CO2 26  GLUCOSE 90  BUN 20  CREATININE 1.40*  CALCIUM 10.0   Liver Function Tests:  Recent Labs Lab 05/24/15 1520  AST 66*  ALT 138*  ALKPHOS 139*  BILITOT 0.5  PROT 7.8  ALBUMIN 4.3    Recent Labs Lab 05/24/15 1520  LIPASE 36   No results for input(s): AMMONIA in the last 168 hours. CBC:  Recent Labs Lab 05/24/15 1520  WBC 6.9  HGB 12.8  HCT 38.8  MCV  87.4  PLT 214   Cardiac Enzymes: No results for input(s): CKTOTAL, CKMB, CKMBINDEX, TROPONINI in the last 168 hours.  BNP (last 3 results) No results for input(s): BNP in the last 8760 hours.  ProBNP (last 3 results) No results for input(s): PROBNP in the last 8760 hours.  CBG:  Recent Labs Lab 05/24/15 1721  GLUCAP 48*    Radiological Exams on Admission: No results found.   EKG: Independently reviewed.    Assessment/Plan:   69 y.o. female with  Principal Problem:    1.    RUQ abdominal pain   IV Dilaudid for Pain PRN   IV Zofran for Nausea PRN   IVFs   Clear Liquids, then NPO after Midnight for ERCP by GI in AM   Gi to see in AM   Active Problems:   2.    Elevated liver enzymes- Possible Retained Stone or Stx   ERCP by GI     3.    Essential hypertension   PRN IV Hydralazine for SBP > 160     4.    Hypercholesterolemia   On Crestor Rx resume when taking PO     5.    DVT Prophylaxis      Code Status:     FULL CODE        Family Communication:   Family at Bedside   No Family Present    Disposition Plan:    Inpatient  Observation  Status        Time spent: 72 Minutes      Theressa Millard [facility] Pager [phone]   If [place] Please Contact the Day Rounding Team MD for [facility]  If 7PM-7AM, Please Contact Night-Floor Coverage  [email or web address] Password [facility] 05/24/2015, 9:26 PM     ADDENDUM:   Patient was seen and examined on 05/24/2015

## 2015-05-24 NOTE — ED Provider Notes (Signed)
CSN: [id number]     Arrival date & time 05/24/15  1413 History   First MD Initiated Contact with Patient 05/24/15 1832     Chief Complaint  Patient presents with  . Elevated lipase   . Nausea  . Chills     (Consider location/radiation/quality/duration/timing/severity/associated sxs/prior Treatment) HPI Comments: Patient with a history of non-insulin dependent DM, HTN, high cholesterol presents by direction of [facility] for evaluation of abnormal labs. She states she has had ongoing epigastric and RUQ abdominal pain for the past 7 months, being evaluated by Dr. Howell Rucks (GI) and Dr. Lysle Rubens (PCP). She reports negative endoscopy/colonoscopy, ultrasounds, and recent MRCP done 05/17/15 showing, per patient, a stone in the biliary system. She was referred to Dr. Arta Silence and had pre-appointment labs done in their office this morning. She was contacted this afternoon and advised to come to the ED for "elevated lipase". She reports that her usual symptoms are episodic abdominal discomfort without aggravating or alleviating factors, not associated with eating; mild nausea without vomiting; minimal diarrhea and never with fever. She reports no change to those symptoms currently.   The history is provided by the patient. No language interpreter was used.    Past Medical History  Diagnosis Date  . HYPERTENSION, UNSPECIFIED   . HYPERLIPIDEMIA-MIXED   . OBESITY   . GERD   . DM   . CATARACT, RIGHT EYE   . Depression   . Anemia   . PONV (postoperative nausea and vomiting)     past history only   Past Surgical History  Procedure Laterality Date  . Appendectomy    . Tubal ligation    . Cholecystectomy      open  . Abdominal hysterectomy    . Back surgery      lumbar '09"microdiscectomy"  . Esophagogastroduodenoscopy (egd) with propofol N/A 11/22/2014    Procedure: ESOPHAGOGASTRODUODENOSCOPY (EGD) WITH PROPOFOL;  Surgeon: Garlan Fair, MD;  Location: [facility];  Service:  Endoscopy;  Laterality: N/A;  . Colonoscopy with propofol N/A 11/22/2014    Procedure: COLONOSCOPY WITH PROPOFOL;  Surgeon: Garlan Fair, MD;  Location: [facility];  Service: Endoscopy;  Laterality: N/A;   No family history on file. Social History  Substance Use Topics  . Smoking status: Never Smoker   . Smokeless tobacco: None  . Alcohol Use: No   OB History    No data available     Review of Systems  Constitutional: Negative for fever and chills.  Respiratory: Negative.  Negative for shortness of breath.   Cardiovascular: Negative.  Negative for chest pain.  Gastrointestinal: Positive for nausea and abdominal pain. Negative for vomiting, diarrhea and constipation.  Genitourinary: Negative.   Musculoskeletal: Negative.   Skin: Negative.   Neurological: Negative.       Allergies  Codeine; Tetracyclines & related; Azithromycin; Erythromycin; Morphine and related; Penicillins; and Septra  Home Medications   Prior to Admission medications   Medication Sig Start Date End Date Taking? Authorizing Provider  Cholecalciferol (VITAMIN D PO) Take 1 tablet by mouth every morning.   Yes Historical Provider, MD  CRESTOR 40 MG tablet TAKE 1 TABLET AT BEDTIME Patient taking differently: TAKE 1 TABLET BY MOUTH AT BEDTIME 04/19/13  Yes Renella Cunas, MD  glimepiride (AMARYL) 2 MG tablet Take 1 mg by mouth 2 (two) times daily.    Yes Historical Provider, MD  iron polysaccharides (NIFEREX) 150 MG capsule Take 150 mg by mouth every morning.   Yes Historical Provider,  MD  losartan (COZAAR) 100 MG tablet Take 100 mg by mouth every morning.    Yes Historical Provider, MD  metFORMIN (GLUCOPHAGE) 1000 MG tablet Take 1,000 mg by mouth 2 (two) times daily with a meal.  05/10/15  Yes Historical Provider, MD  pantoprazole (PROTONIX) 40 MG tablet Take 40 mg by mouth every morning.    Yes Historical Provider, MD  venlafaxine XR (EFFEXOR-XR) 75 MG 24 hr capsule Take 75 mg by mouth every morning.    Yes  Historical Provider, MD  VICTOZA 18 MG/3ML SOPN Inject 1.2 mg as directed at bedtime.  10/03/14  Yes Historical Provider, MD  vitamin B-12 (CYANOCOBALAMIN) 1000 MCG tablet Take 1,000 mcg by mouth 3 (three) times a week. Take on Sundays, Wednesdays and Fridays.   Yes Historical Provider, MD  fluticasone (FLONASE) 50 MCG/ACT nasal spray Place 2 sprays into both nostrils daily as needed for allergies.     Historical Provider, MD  ibuprofen (ADVIL,MOTRIN) 200 MG tablet Take 200 mg by mouth every 6 (six) hours as needed for pain, fever or headache.     Historical Provider, MD   BP 116/64 mmHg  Pulse 87  Temp(Src) 98 F (36.7 C) (Oral)  Resp 16  SpO2 93% Physical Exam  Constitutional: She is oriented to person, place, and time. She appears well-developed and well-nourished.  HENT:  Head: Normocephalic.  Neck: Normal range of motion. Neck supple.  Cardiovascular: Normal rate and regular rhythm.   Pulmonary/Chest: Effort normal and breath sounds normal. She has no wheezes. She has no rales.  Abdominal: Soft. Bowel sounds are normal. There is tenderness. There is no rebound and no guarding.  RUQ and epigastric tenderness to soft abdomen. Nondistended.  Musculoskeletal: Normal range of motion.  Neurological: She is alert and oriented to person, place, and time.  Skin: Skin is warm and dry. No rash noted.  Psychiatric: She has a normal mood and affect.    ED Course  Procedures (including critical care time) Labs Review Labs Reviewed  COMPREHENSIVE METABOLIC PANEL - Abnormal; Notable for the following:    Creatinine, Ser 1.40 (*)    AST 66 (*)    ALT 138 (*)    Alkaline Phosphatase 139 (*)    GFR calc non Af Amer 38 (*)    GFR calc Af Amer 44 (*)    All other components within normal limits  CBG MONITORING, ED - Abnormal; Notable for the following:    Glucose-Capillary 48 (*)    All other components within normal limits  LIPASE, BLOOD  CBC    Imaging Review No results found. I have  personally reviewed and evaluated these images and lab results as part of my medical decision-making.   EKG Interpretation None      MDM   Final diagnoses:  None    1. Chronic abdominal pain  The patient is comfortable in the ED. VSS. Chart reviewed: MRCP 05/17/15 shows stricture in the CBD causing intra and extrahepatic dilation; a 4 mm stone in the left hepatic ductal system. She has a normal lipase here without other significant abnormality (mildy elevated transaminases and alk phos with normal total bili). No leukocytosis.   Will talk to GI regarding plan of care.   Discussed with Dr. Cristina Gong who was able to access office records. Lipase normal in the office, abnormality was elevated liver enzymes that are comparable to lab studies in ED. He recommends admission to undergo ERCP and stone removal. Discussed with Hospitalist who accepts for  admission. GI will follow in the hospital.  Charlann Lange, PA-C 05/24/15 2037  Virgel Manifold, MD 05/25/15 1435

## 2015-05-25 DIAGNOSIS — I10 Essential (primary) hypertension: Secondary | ICD-10-CM

## 2015-05-25 DIAGNOSIS — E669 Obesity, unspecified: Secondary | ICD-10-CM

## 2015-05-25 DIAGNOSIS — E119 Type 2 diabetes mellitus without complications: Secondary | ICD-10-CM

## 2015-05-25 DIAGNOSIS — E1169 Type 2 diabetes mellitus with other specified complication: Secondary | ICD-10-CM

## 2015-05-25 DIAGNOSIS — R748 Abnormal levels of other serum enzymes: Secondary | ICD-10-CM

## 2015-05-25 LAB — GLUCOSE, CAPILLARY
GLUCOSE-CAPILLARY: 164 mg/dL — AB (ref 65–99)
Glucose-Capillary: 135 mg/dL — ABNORMAL HIGH (ref 65–99)

## 2015-05-25 LAB — COMPREHENSIVE METABOLIC PANEL WITH GFR
ALT: 90 U/L — ABNORMAL HIGH (ref 14–54)
AST: 28 U/L (ref 15–41)
Albumin: 3.9 g/dL (ref 3.5–5.0)
Alkaline Phosphatase: 117 U/L (ref 38–126)
Anion gap: 7 (ref 5–15)
BUN: 19 mg/dL (ref 6–20)
CO2: 25 mmol/L (ref 22–32)
Calcium: 9.6 mg/dL (ref 8.9–10.3)
Chloride: 107 mmol/L (ref 101–111)
Creatinine, Ser: 1.36 mg/dL — ABNORMAL HIGH (ref 0.44–1.00)
GFR calc Af Amer: 45 mL/min — ABNORMAL LOW
GFR calc non Af Amer: 39 mL/min — ABNORMAL LOW
Glucose, Bld: 185 mg/dL — ABNORMAL HIGH (ref 65–99)
Potassium: 4.5 mmol/L (ref 3.5–5.1)
Sodium: 139 mmol/L (ref 135–145)
Total Bilirubin: 0.8 mg/dL (ref 0.3–1.2)
Total Protein: 7.2 g/dL (ref 6.5–8.1)

## 2015-05-25 LAB — CBC
HCT: 35.2 % — ABNORMAL LOW (ref 36.0–46.0)
Hemoglobin: 11.7 g/dL — ABNORMAL LOW (ref 12.0–15.0)
MCH: 29 pg (ref 26.0–34.0)
MCHC: 33.2 g/dL (ref 30.0–36.0)
MCV: 87.1 fL (ref 78.0–100.0)
PLATELETS: 196 10*3/uL (ref 150–400)
RBC: 4.04 MIL/uL (ref 3.87–5.11)
RDW: 14.3 % (ref 11.5–15.5)
WBC: 5.9 10*3/uL (ref 4.0–10.5)

## 2015-05-25 LAB — BASIC METABOLIC PANEL
Anion gap: 7 (ref 5–15)
BUN: 19 mg/dL (ref 6–20)
CHLORIDE: 108 mmol/L (ref 101–111)
CO2: 26 mmol/L (ref 22–32)
CREATININE: 1.46 mg/dL — AB (ref 0.44–1.00)
Calcium: 9.6 mg/dL (ref 8.9–10.3)
GFR calc Af Amer: 41 mL/min — ABNORMAL LOW (ref >60)
GFR calc non Af Amer: 36 mL/min — ABNORMAL LOW (ref >60)
GLUCOSE: 133 mg/dL — AB (ref 65–99)
Potassium: 4.6 mmol/L (ref 3.5–5.1)
SODIUM: 141 mmol/L (ref 135–145)

## 2015-05-25 MED ORDER — FLUTICASONE PROPIONATE 50 MCG/ACT NA SUSP
2.0000 | Freq: Every day | NASAL | Status: DC | PRN
Start: 2015-05-25 — End: 2015-05-27

## 2015-05-25 MED ORDER — VENLAFAXINE HCL ER 75 MG PO CP24
75.0000 mg | ORAL_CAPSULE | Freq: Every morning | ORAL | Status: DC
  Administered 2015-05-25 – 2015-05-27 (×3): 75 mg via ORAL
  Filled 2015-05-25 (×3): qty 1

## 2015-05-25 MED ORDER — PANTOPRAZOLE SODIUM 40 MG PO TBEC
40.0000 mg | DELAYED_RELEASE_TABLET | Freq: Every morning | ORAL | Status: DC
  Administered 2015-05-25 – 2015-05-27 (×3): 40 mg via ORAL
  Filled 2015-05-25 (×3): qty 1

## 2015-05-25 NOTE — Progress Notes (Signed)
[facility] Progress Note   Tina Briggs HQI:[id number] DOB: 1945-11-21 DOA: 05/24/2015 PCP: Wenda Low, MD  Brief narrative: Tina Briggs is a 69 y.o. female with HTN, HLP, obesity, GERD, DM on oral hypoglycemics.  The patient states she has been having GI issues since Feb insistent with upper abdominal pain and nausea. Her PCP and Dr Earle Gell with GI have been working up her symptoms. He had an MRCP last week which was abnormal and she was referred to see Dr. Paulita Fujita. She has not yet seen him. She states that she was asked to come to the ER by PCP. ER note mentioned she was asked to come because of a "elevated lipase". She admits to about 5 lb weight loss in the past few weeks.   Subjective: Currently not having any abdominal pain or nausea. No diarrhea.  Assessment/Plan: Principal Problem:   Elevated liver enzymes/nausea/ epigastric and right upper quadrant abdominal pain -MRCP performed on 8/17 reviewed-this shows a distal common bile duct stricture with moderately dilated intra-and extrahepatic bile ducts-4 mm intraductal calculus in the left intrahepatic bile duct -GI has evaluated her today and plan on performing an ERCP tomorrow  Active Problems: Large hiatal hernia -Noted on above-mentioned MRCP    Obesity   Essential hypertension - Hold losartan as BP is low    Hypercholesterolemia -Hold Crestor for now    Diabetes mellitus type 2 in obese -Sliding-scale insulin ordered -hold Amaryl, Victoza and metformin  GERD - possibly related to large hiatal hernia? - cont Protonix   Appt with PCP: requested Code Status: full code Family Communication:  Disposition Plan: ERCP tomorrow DVT prophylaxis: SCDs Consultants: [facility] Procedures:  Antibiotics: Anti-infectives    None      Objective: There were no vitals filed for this visit.  Intake/Output Summary (Last 24 hours) at 05/25/15 0756 Last data filed at 05/25/15 0600  Gross per  24 hour  Intake 551.25 ml  Output      0 ml  Net 551.25 ml     Vitals Filed Vitals:   05/24/15 2205 05/24/15 2207 05/25/15 0156 05/25/15 0515  BP:  105/69 111/66 101/61  Pulse:  91 93 78  Temp:  97.9 F (36.6 C) 98.3 F (36.8 C) 97.9 F (36.6 C)  TempSrc:  Oral Oral Oral  Resp:  18 18 16   Height: 5' 3.5" (1.613 m)     SpO2:  98% 96% 97%    Exam:  General:  Pt is alert, not in acute distress  HEENT: No icterus, No thrush, oral mucosa moist  Cardiovascular: regular rate and rhythm, S1/S2 No murmur  Respiratory: clear to auscultation bilaterally   Abdomen: Soft, +Bowel sounds, non tender, non distended, no guarding  MSK: No LE edema, cyanosis or clubbing  Data Reviewed: Basic Metabolic Panel:  Recent Labs Lab 05/24/15 1520 05/25/15 0520  NA 140 141  K 4.5 4.6  CL 104 108  CO2 26 26  GLUCOSE 90 133*  BUN 20 19  CREATININE 1.40* 1.46*  CALCIUM 10.0 9.6   Liver Function Tests:  Recent Labs Lab 05/24/15 1520  AST 66*  ALT 138*  ALKPHOS 139*  BILITOT 0.5  PROT 7.8  ALBUMIN 4.3    Recent Labs Lab 05/24/15 1520  LIPASE 36   No results for input(s): AMMONIA in the last 168 hours. CBC:  Recent Labs Lab 05/24/15 1520 05/25/15 0520  WBC 6.9 5.9  HGB 12.8 11.7*  HCT 38.8 35.2*  MCV 87.4 87.1  PLT  214 196   Cardiac Enzymes: No results for input(s): CKTOTAL, CKMB, CKMBINDEX, TROPONINI in the last 168 hours. BNP (last 3 results) No results for input(s): BNP in the last 8760 hours.  ProBNP (last 3 results) No results for input(s): PROBNP in the last 8760 hours.  CBG:  Recent Labs Lab 05/24/15 1721 05/24/15 2143  GLUCAP 48* 125*    No results found for this or any previous visit (from the past 240 hour(s)).   Studies: No results found.  Scheduled Meds:  Scheduled Meds: . pantoprazole (PROTONIX) IV  40 mg Intravenous QHS   Continuous Infusions: . sodium chloride 75 mL/hr at 05/24/15 2239    Time spent on care of this patient:  66 min   [place], MD 05/25/2015, 7:56 AM  LOS: 1 day   [facility] Office  [phone] Pager - Text Page per [email or web address] If 7PM-7AM, please contact night-coverage [email or web address]

## 2015-05-25 NOTE — Progress Notes (Signed)
Inpatient Diabetes Program Recommendations  AACE/ADA: New Consensus Statement on Inpatient Glycemic Control (2013)  Target Ranges:  Prepandial:   less than 140 mg/dL      Peak postprandial:   less than 180 mg/dL (1-2 hours)      Critically ill patients:  140 - 180 mg/dL   Results for ALVA, KUENZEL (MRN [id number]) as of 05/25/2015 13:40  Ref. Range 11/22/2014 09:20 05/24/2015 17:21 05/24/2015 21:43 05/25/2015 00:27  Glucose-Capillary Latest Ref Range: 65-99 mg/dL 119 (H) 48 (L) 125 (H) 135 (H)   Hypoglycemia on 8/24 likely d/t NPO status.  Recommendations: Add Novolog sensitive tidwc Change diet to CHO mod med.  Will follow.Thank you. Lorenda Peck, RD, LDN, CDE Inpatient Diabetes Coordinator [phone]

## 2015-05-25 NOTE — Progress Notes (Signed)
Date:  May 25, 2015 U.R. performed for needs and level of care. Will continue to follow for Case Management needs.  Velva Harman, RN, BSN, [place]   [phone]

## 2015-05-25 NOTE — Consult Note (Signed)
[facility] Consult Note  Referring Provider: No ref. provider found Primary Care Physician:  Wenda Low, MD Primary Gastroenterologist:  Dr.  Laurel Dimmer Complaint: Abdominal pain, Nausea and vomiting HPI: Tina Briggs is an 69 y.o. white female  who presents with intermittent epigastric and right upper quadrant pain associated with nausea and some vomiting who was found to have biliary ductal dilatation with a suggested stone in the left hepatic duct and possible one or more stones in the common bile duct but felt less likely. She was referred by her primary physician to a gastroenterologist for ERCP and possible EUS but had worsening pain 2 nights ago and had blood work drawn which showed slightly elevated liver function tests. She was instructed to come to the emergency room. She is actually not had any significant pain today or yesterday. She had an open cholecystectomy approximately 40 years ago. She is recently had an EGD and colonoscopy for iron deficiency anemia which were unrevealing.  Past Medical History  Diagnosis Date  . HYPERTENSION, UNSPECIFIED   . HYPERLIPIDEMIA-MIXED   . OBESITY   . GERD   . DM   . CATARACT, RIGHT EYE   . Depression   . Anemia   . PONV (postoperative nausea and vomiting)     past history only    Past Surgical History  Procedure Laterality Date  . Appendectomy    . Tubal ligation    . Cholecystectomy      open  . Abdominal hysterectomy    . Back surgery      lumbar '09"microdiscectomy"  . Esophagogastroduodenoscopy (egd) with propofol N/A 11/22/2014    Procedure: ESOPHAGOGASTRODUODENOSCOPY (EGD) WITH PROPOFOL;  Surgeon: Garlan Fair, MD;  Location: [facility];  Service: Endoscopy;  Laterality: N/A;  . Colonoscopy with propofol N/A 11/22/2014    Procedure: COLONOSCOPY WITH PROPOFOL;  Surgeon: Garlan Fair, MD;  Location: [facility];  Service: Endoscopy;  Laterality: N/A;    Medications Prior to Admission  Medication  Sig Dispense Refill  . Cholecalciferol (VITAMIN D PO) Take 1 tablet by mouth every morning.    Marland Kitchen CRESTOR 40 MG tablet TAKE 1 TABLET AT BEDTIME (Patient taking differently: TAKE 1 TABLET BY MOUTH AT BEDTIME) 90 tablet 1  . glimepiride (AMARYL) 2 MG tablet Take 1 mg by mouth 2 (two) times daily.     . iron polysaccharides (NIFEREX) 150 MG capsule Take 150 mg by mouth every morning.    Marland Kitchen losartan (COZAAR) 100 MG tablet Take 100 mg by mouth every morning.     . metFORMIN (GLUCOPHAGE) 1000 MG tablet Take 1,000 mg by mouth 2 (two) times daily with a meal.     . pantoprazole (PROTONIX) 40 MG tablet Take 40 mg by mouth every morning.     . venlafaxine XR (EFFEXOR-XR) 75 MG 24 hr capsule Take 75 mg by mouth every morning.     Marland Kitchen VICTOZA 18 MG/3ML SOPN Inject 1.2 mg as directed at bedtime.     . vitamin B-12 (CYANOCOBALAMIN) 1000 MCG tablet Take 1,000 mcg by mouth 3 (three) times a week. Take on Sundays, Wednesdays and Fridays.    . fluticasone (FLONASE) 50 MCG/ACT nasal spray Place 2 sprays into both nostrils daily as needed for allergies.     Marland Kitchen ibuprofen (ADVIL,MOTRIN) 200 MG tablet Take 200 mg by mouth every 6 (six) hours as needed for pain, fever or headache.       Allergies:  Allergies  Allergen Reactions  . Codeine Shortness Of Breath  .  Tetracyclines & Related   . Azithromycin Itching and Rash  . Erythromycin Itching and Rash  . Morphine And Related Itching and Rash  . Penicillins Itching and Rash  . Septra [Sulfamethoxazole-Trimethoprim] Itching and Rash    No family history on file.  Social History:  reports that she has never smoked. She does not have any smokeless tobacco history on file. She reports that she does not drink alcohol or use illicit drugs.  Review of Systems: negative except as above   Blood pressure 101/61, pulse 78, temperature 97.9 F (36.6 C), temperature source Oral, resp. rate 16, height 5' 3.5" (1.613 m), SpO2 97 %. Head: Normocephalic, without obvious  abnormality, atraumatic Neck: no adenopathy, no carotid bruit, no JVD, supple, symmetrical, trachea midline and thyroid not enlarged, symmetric, no tenderness/mass/nodules Resp: clear to auscultation bilaterally Cardio: regular rate and rhythm, S1, S2 normal, no murmur, click, rub or gallop GI: Abdomen soft nondistended with normoactive bowel sounds. No hepatomegaly masses or guarding Extremities: extremities normal, atraumatic, no cyanosis or edema  Results for orders placed or performed during the hospital encounter of 05/24/15 (from the past 48 hour(s))  Lipase, blood     Status: None   Collection Time: 05/24/15  3:20 PM  Result Value Ref Range   Lipase 36 22 - 51 U/L  Comprehensive metabolic panel     Status: Abnormal   Collection Time: 05/24/15  3:20 PM  Result Value Ref Range   Sodium 140 135 - 145 mmol/L   Potassium 4.5 3.5 - 5.1 mmol/L   Chloride 104 101 - 111 mmol/L   CO2 26 22 - 32 mmol/L   Glucose, Bld 90 65 - 99 mg/dL   BUN 20 6 - 20 mg/dL   Creatinine, Ser 1.40 (H) 0.44 - 1.00 mg/dL   Calcium 10.0 8.9 - 10.3 mg/dL   Total Protein 7.8 6.5 - 8.1 g/dL   Albumin 4.3 3.5 - 5.0 g/dL   AST 66 (H) 15 - 41 U/L   ALT 138 (H) 14 - 54 U/L   Alkaline Phosphatase 139 (H) 38 - 126 U/L   Total Bilirubin 0.5 0.3 - 1.2 mg/dL   GFR calc non Af Amer 38 (L) >60 mL/min   GFR calc Af Amer 44 (L) >60 mL/min    Comment: (NOTE) The eGFR has been calculated using the CKD EPI equation. This calculation has not been validated in all clinical situations. eGFR's persistently <60 mL/min signify possible Chronic Kidney Disease.    Anion gap 10 5 - 15  CBC     Status: None   Collection Time: 05/24/15  3:20 PM  Result Value Ref Range   WBC 6.9 4.0 - 10.5 K/uL   RBC 4.44 3.87 - 5.11 MIL/uL   Hemoglobin 12.8 12.0 - 15.0 g/dL   HCT 38.8 36.0 - 46.0 %   MCV 87.4 78.0 - 100.0 fL   MCH 28.8 26.0 - 34.0 pg   MCHC 33.0 30.0 - 36.0 g/dL   RDW 14.2 11.5 - 15.5 %   Platelets 214 150 - 400 K/uL  CBG  monitoring, ED     Status: Abnormal   Collection Time: 05/24/15  5:21 PM  Result Value Ref Range   Glucose-Capillary 48 (L) 65 - 99 mg/dL  CBG monitoring, ED     Status: Abnormal   Collection Time: 05/24/15  9:43 PM  Result Value Ref Range   Glucose-Capillary 125 (H) 65 - 99 mg/dL  Basic metabolic panel     Status:  Abnormal   Collection Time: 05/25/15  5:20 AM  Result Value Ref Range   Sodium 141 135 - 145 mmol/L   Potassium 4.6 3.5 - 5.1 mmol/L   Chloride 108 101 - 111 mmol/L   CO2 26 22 - 32 mmol/L   Glucose, Bld 133 (H) 65 - 99 mg/dL   BUN 19 6 - 20 mg/dL   Creatinine, Ser 1.46 (H) 0.44 - 1.00 mg/dL   Calcium 9.6 8.9 - 10.3 mg/dL   GFR calc non Af Amer 36 (L) >60 mL/min   GFR calc Af Amer 41 (L) >60 mL/min    Comment: (NOTE) The eGFR has been calculated using the CKD EPI equation. This calculation has not been validated in all clinical situations. eGFR's persistently <60 mL/min signify possible Chronic Kidney Disease.    Anion gap 7 5 - 15  CBC     Status: Abnormal   Collection Time: 05/25/15  5:20 AM  Result Value Ref Range   WBC 5.9 4.0 - 10.5 K/uL   RBC 4.04 3.87 - 5.11 MIL/uL   Hemoglobin 11.7 (L) 12.0 - 15.0 g/dL   HCT 35.2 (L) 36.0 - 46.0 %   MCV 87.1 78.0 - 100.0 fL   MCH 29.0 26.0 - 34.0 pg   MCHC 33.2 30.0 - 36.0 g/dL   RDW 14.3 11.5 - 15.5 %   Platelets 196 150 - 400 K/uL   No results found.  Assessment: Suspicious common bile duct stones, with one visualized in the left hepatic duct presented with intermittent biliary colicky type pain Plan:  Will proceed with ERCP tomorrow. Risks rationale and alternatives were explained to the patient and she wishes to proceed. Tawan Corkern C 05/25/2015, 7:59 AM  Pager [phone] If no answer or after 5 PM call [phone]

## 2015-05-26 ENCOUNTER — Inpatient Hospital Stay (HOSPITAL_COMMUNITY): Payer: Medicare Other

## 2015-05-26 ENCOUNTER — Encounter (HOSPITAL_COMMUNITY)
Admission: EM | Disposition: A | Discharge status: Home / Self Care | Payer: Self-pay | Source: Home / Self Care | Attending: Internal Medicine

## 2015-05-26 ENCOUNTER — Inpatient Hospital Stay (HOSPITAL_COMMUNITY): Payer: Medicare Other | Admitting: Certified Registered Nurse Anesthetist

## 2015-05-26 ENCOUNTER — Encounter (HOSPITAL_COMMUNITY): Payer: Self-pay | Admitting: *Deleted

## 2015-05-26 HISTORY — PX: ERCP: SHX5425

## 2015-05-26 LAB — CBC
HEMATOCRIT: 34.1 % — AB (ref 36.0–46.0)
Hemoglobin: 11.2 g/dL — ABNORMAL LOW (ref 12.0–15.0)
MCH: 28.5 pg (ref 26.0–34.0)
MCHC: 32.8 g/dL (ref 30.0–36.0)
MCV: 86.8 fL (ref 78.0–100.0)
PLATELETS: 198 10*3/uL (ref 150–400)
RBC: 3.93 MIL/uL (ref 3.87–5.11)
RDW: 14.2 % (ref 11.5–15.5)
WBC: 6 10*3/uL (ref 4.0–10.5)

## 2015-05-26 LAB — SURGICAL PCR SCREEN
MRSA, PCR: NEGATIVE
Staphylococcus aureus: NEGATIVE

## 2015-05-26 LAB — GLUCOSE, CAPILLARY: Glucose-Capillary: 111 mg/dL — ABNORMAL HIGH (ref 65–99)

## 2015-05-26 SURGERY — ERCP, WITH INTERVENTION IF INDICATED
Anesthesia: Moderate Sedation

## 2015-05-26 SURGERY — ERCP, WITH INTERVENTION IF INDICATED
Anesthesia: General

## 2015-05-26 MED ORDER — PROPOFOL 10 MG/ML IV BOLUS
INTRAVENOUS | Status: AC
  Filled 2015-05-26: qty 20

## 2015-05-26 MED ORDER — PHENYLEPHRINE HCL 10 MG/ML IJ SOLN
INTRAMUSCULAR | Status: DC | PRN
  Administered 2015-05-26 (×2): 80 ug via INTRAVENOUS
  Administered 2015-05-26: 160 ug via INTRAVENOUS

## 2015-05-26 MED ORDER — IOHEXOL 300 MG/ML  SOLN
INTRAMUSCULAR | Status: DC | PRN
  Administered 2015-05-26: 60 mL

## 2015-05-26 MED ORDER — MIDAZOLAM HCL 5 MG/5ML IJ SOLN
INTRAMUSCULAR | Status: DC | PRN
  Administered 2015-05-26: 1 mg via INTRAVENOUS

## 2015-05-26 MED ORDER — INFLUENZA VAC SPLIT QUAD 0.5 ML IM SUSY
0.5000 mL | PREFILLED_SYRINGE | INTRAMUSCULAR | Status: DC
  Filled 2015-05-26 (×2): qty 0.5

## 2015-05-26 MED ORDER — ROCURONIUM BROMIDE 100 MG/10ML IV SOLN
INTRAVENOUS | Status: DC | PRN
  Administered 2015-05-26: 20 mg via INTRAVENOUS
  Administered 2015-05-26: 5 mg via INTRAVENOUS
  Administered 2015-05-26: 10 mg via INTRAVENOUS

## 2015-05-26 MED ORDER — GLYCOPYRROLATE 0.2 MG/ML IJ SOLN
INTRAMUSCULAR | Status: DC | PRN
  Administered 2015-05-26: 0.4 mg via INTRAVENOUS

## 2015-05-26 MED ORDER — LIDOCAINE HCL (CARDIAC) 20 MG/ML IV SOLN
INTRAVENOUS | Status: DC | PRN
  Administered 2015-05-26: 75 mg via INTRAVENOUS
  Administered 2015-05-26: 25 mg via INTRATRACHEAL

## 2015-05-26 MED ORDER — LACTATED RINGERS IV SOLN
INTRAVENOUS | Status: DC
  Administered 2015-05-26 – 2015-05-27 (×3): via INTRAVENOUS

## 2015-05-26 MED ORDER — HYDROMORPHONE HCL 1 MG/ML IJ SOLN: 0.2500 mg | INTRAMUSCULAR | Status: DC | PRN

## 2015-05-26 MED ORDER — DEXAMETHASONE SODIUM PHOSPHATE 10 MG/ML IJ SOLN
INTRAMUSCULAR | Status: AC
  Filled 2015-05-26: qty 1

## 2015-05-26 MED ORDER — NEOSTIGMINE METHYLSULFATE 10 MG/10ML IV SOLN
INTRAVENOUS | Status: DC | PRN
  Administered 2015-05-26: 3 mg via INTRAVENOUS

## 2015-05-26 MED ORDER — MIDAZOLAM HCL 2 MG/2ML IJ SOLN
INTRAMUSCULAR | Status: AC
  Filled 2015-05-26: qty 4

## 2015-05-26 MED ORDER — ONDANSETRON HCL 4 MG/2ML IJ SOLN
INTRAMUSCULAR | Status: DC | PRN
  Administered 2015-05-26: 4 mg via INTRAVENOUS

## 2015-05-26 MED ORDER — ONDANSETRON HCL 4 MG/2ML IJ SOLN
INTRAMUSCULAR | Status: AC
  Filled 2015-05-26: qty 2

## 2015-05-26 MED ORDER — CIPROFLOXACIN IN D5W 400 MG/200ML IV SOLN
INTRAVENOUS | Status: DC | PRN
  Administered 2015-05-26: 400 mg via INTRAVENOUS

## 2015-05-26 MED ORDER — PROPOFOL INFUSION 10 MG/ML OPTIME
INTRAVENOUS | Status: DC | PRN
  Administered 2015-05-26: 100 ug/kg/min via INTRAVENOUS

## 2015-05-26 MED ORDER — LABETALOL HCL 5 MG/ML IV SOLN
INTRAVENOUS | Status: DC | PRN
  Administered 2015-05-26: 2.5 mg via INTRAVENOUS

## 2015-05-26 MED ORDER — GLYCOPYRROLATE 0.2 MG/ML IJ SOLN
INTRAMUSCULAR | Status: AC
  Filled 2015-05-26: qty 2

## 2015-05-26 MED ORDER — SODIUM CHLORIDE 0.9 % IV SOLN: INTRAVENOUS | Status: DC

## 2015-05-26 MED ORDER — LIDOCAINE HCL (CARDIAC) 20 MG/ML IV SOLN
INTRAVENOUS | Status: AC
  Filled 2015-05-26: qty 5

## 2015-05-26 MED ORDER — GLUCAGON HCL RDNA (DIAGNOSTIC) 1 MG IJ SOLR
INTRAMUSCULAR | Status: DC | PRN
  Administered 2015-05-26: 1 mg via INTRAVENOUS

## 2015-05-26 MED ORDER — FENTANYL CITRATE (PF) 250 MCG/5ML IJ SOLN
INTRAMUSCULAR | Status: AC
  Filled 2015-05-26: qty 25

## 2015-05-26 MED ORDER — GLUCAGON HCL RDNA (DIAGNOSTIC) 1 MG IJ SOLR
INTRAMUSCULAR | Status: AC
  Filled 2015-05-26: qty 1

## 2015-05-26 MED ORDER — LACTATED RINGERS IV SOLN
INTRAVENOUS | Status: DC
  Administered 2015-05-26: 15:00:00 via INTRAVENOUS
  Administered 2015-05-26: 1000 mL via INTRAVENOUS

## 2015-05-26 MED ORDER — CIPROFLOXACIN IN D5W 400 MG/200ML IV SOLN: 400.0000 mg | Freq: Once | INTRAVENOUS | Status: DC

## 2015-05-26 MED ORDER — ROCURONIUM BROMIDE 100 MG/10ML IV SOLN
INTRAVENOUS | Status: AC
  Filled 2015-05-26: qty 1

## 2015-05-26 MED ORDER — FENTANYL CITRATE (PF) 100 MCG/2ML IJ SOLN
INTRAMUSCULAR | Status: DC | PRN
  Administered 2015-05-26 (×2): 50 ug via INTRAVENOUS

## 2015-05-26 MED ORDER — SUCCINYLCHOLINE CHLORIDE 20 MG/ML IJ SOLN
INTRAMUSCULAR | Status: DC | PRN
  Administered 2015-05-26: 100 mg via INTRAVENOUS

## 2015-05-26 MED ORDER — NEOSTIGMINE METHYLSULFATE 10 MG/10ML IV SOLN
INTRAVENOUS | Status: AC
  Filled 2015-05-26: qty 1

## 2015-05-26 MED ORDER — PROPOFOL 10 MG/ML IV BOLUS
INTRAVENOUS | Status: DC | PRN
  Administered 2015-05-26: 160 mg via INTRAVENOUS
  Administered 2015-05-26: 40 mg via INTRAVENOUS

## 2015-05-26 MED ORDER — CIPROFLOXACIN IN D5W 400 MG/200ML IV SOLN
INTRAVENOUS | Status: AC
  Filled 2015-05-26: qty 200

## 2015-05-26 NOTE — Anesthesia Procedure Notes (Signed)
Procedure Name: Intubation Date/Time: 05/26/2015 1:23 PM Performed by: Lissa Morales Pre-anesthesia Checklist: Patient identified, Emergency Drugs available, Suction available and Patient being monitored Patient Re-evaluated:Patient Re-evaluated prior to inductionOxygen Delivery Method: Circle System Utilized Preoxygenation: Pre-oxygenation with 100% oxygen Intubation Type: IV induction Ventilation: Mask ventilation without difficulty Laryngoscope Size: Mac and 4 Tube type: Oral Tube size: 7.5 mm Number of attempts: 1 Airway Equipment and Method: Stylet,  Oral airway and Bougie stylet (anterior larynx with need to use  bougie) Placement Confirmation: ETT inserted through vocal cords under direct vision,  positive ETCO2 and breath sounds checked- equal and bilateral Secured at: 21 cm Tube secured with: Tape Dental Injury: Teeth and Oropharynx as per pre-operative assessment  Difficulty Due To: Difficult Airway- due to anterior larynx

## 2015-05-26 NOTE — Care Management Important Message (Signed)
Important Message  Patient Details  Name: MODINE OPPENHEIMER MRN: [id number] Date of Birth: July 04, 1946   Medicare Important Message Given:  [facility] notification given    Camillo Flaming 05/26/2015, 1:31 PMImportant Message  Patient Details  Name: YISSEL HABERMEHL MRN: [id number] Date of Birth: Nov 01, 1945   Medicare Important Message Given:  Yes-second notification given    Camillo Flaming 05/26/2015, 1:30 PM

## 2015-05-26 NOTE — Progress Notes (Addendum)
[facility] Progress Note   Tina Briggs PFX:[id number] DOB: 1946/05/29 DOA: 05/24/2015 PCP: Wenda Low, MD  Brief narrative: Tina Briggs is a 69 y.o. female with HTN, HLP, obesity, GERD, DM on oral hypoglycemics.  The patient states she has been having GI issues since Feb insistent with upper abdominal pain and nausea. Her PCP and Dr Earle Gell with GI have been working up her symptoms. He had an MRCP last week which was abnormal and she was referred to see Dr. Paulita Fujita. She has not yet seen him. She states that she was asked to come to the ER by PCP. ER note mentioned she was asked to come because of a "elevated lipase". She admits to about 5 lb weight loss in the past few weeks.   Subjective: No complaint of abdominal pain despite eating yesterday- she states she has not eaten so much in weeks. No nausea or vomitnig.   Assessment/Plan: Principal Problem:   Elevated liver enzymes/nausea/ epigastric and right upper quadrant abdominal pain -MRCP performed on 8/17 reviewed-this shows a distal common bile duct stricture with moderately dilated intra-and extrahepatic bile ducts-4 mm intraductal calculus in the left intrahepatic bile duct -GI has evaluated her -plan on performing an ERCP today at 1 PM  Active Problems: Large hiatal hernia -Noted on above-mentioned MRCP  ARF? Vs CKD 3 - last Cr was 0.85 in 2009- may have developed CKD since then - holding Losartan due to hypotension - well hydrated now without much change in Cr therefore doubt this is prerenal    Essential hypertension - cont to hold losartan as BP is moslty low    Hypercholesterolemia -Hold Crestor for now    Diabetes mellitus type 2 in obese -Sliding-scale insulin ordered- sugars stable -hold Amaryl, Victoza and metformin  GERD - possibly related to large hiatal hernia? - cont Protonix   Appt with PCP: requested Code Status: full code Family Communication: husband Disposition Plan:    DVT prophylaxis: SCDs Consultants: [facility] Procedures:  Antibiotics: Anti-infectives    None      Objective: There were no vitals filed for this visit.  Intake/Output Summary (Last 24 hours) at 05/26/15 0927 Last data filed at 05/26/15 0910  Gross per 24 hour  Intake    480 ml  Output      0 ml  Net    480 ml     Vitals Filed Vitals:   05/25/15 2002 05/26/15 0005 05/26/15 0351 05/26/15 0837  BP: 112/56 130/64 106/64 113/61  Pulse: 83 82 81 74  Temp: 97.6 F (36.4 C) 97.6 F (36.4 C) 98.2 F (36.8 C) 97.9 F (36.6 C)  TempSrc: Oral Axillary Oral Oral  Resp: 18 18 16 18   Height:      SpO2: 100% 100% 95% 95%    Exam:  General:  Pt is alert, not in acute distress  HEENT: No icterus, No thrush, oral mucosa moist  Cardiovascular: regular rate and rhythm, S1/S2 No murmur  Respiratory: clear to auscultation bilaterally   Abdomen: Soft, +Bowel sounds, non tender, non distended, no guarding  MSK: No LE edema, cyanosis or clubbing  Data Reviewed: Basic Metabolic Panel:  Recent Labs Lab 05/24/15 1520 05/25/15 0520 05/25/15 1410  NA 140 141 139  K 4.5 4.6 4.5  CL 104 108 107  CO2 26 26 25   GLUCOSE 90 133* 185*  BUN 20 19 19   CREATININE 1.40* 1.46* 1.36*  CALCIUM 10.0 9.6 9.6   Liver Function Tests:  Recent Labs Lab  05/24/15 1520 05/25/15 1410  AST 66* 28  ALT 138* 90*  ALKPHOS 139* 117  BILITOT 0.5 0.8  PROT 7.8 7.2  ALBUMIN 4.3 3.9    Recent Labs Lab 05/24/15 1520  LIPASE 36   No results for input(s): AMMONIA in the last 168 hours. CBC:  Recent Labs Lab 05/24/15 1520 05/25/15 0520 05/26/15 0540  WBC 6.9 5.9 6.0  HGB 12.8 11.7* 11.2*  HCT 38.8 35.2* 34.1*  MCV 87.4 87.1 86.8  PLT 214 196 198   Cardiac Enzymes: No results for input(s): CKTOTAL, CKMB, CKMBINDEX, TROPONINI in the last 168 hours. BNP (last 3 results) No results for input(s): BNP in the last 8760 hours.  ProBNP (last 3 results) No results for input(s):  PROBNP in the last 8760 hours.  CBG:  Recent Labs Lab 05/24/15 1721 05/24/15 2143 05/25/15 0027 05/25/15 0907  GLUCAP 48* 125* 135* 164*    Recent Results (from the past 240 hour(s))  Surgical pcr screen     Status: None   Collection Time: 05/26/15  7:45 AM  Result Value Ref Range Status   MRSA, PCR NEGATIVE NEGATIVE Final   Staphylococcus aureus NEGATIVE NEGATIVE Final    Comment:        The Xpert SA Assay (FDA approved for NASAL specimens in patients over 41 years of age), is one component of a comprehensive surveillance program.  Test performance has been validated by [facility] for patients greater than or equal to 73 year old. It is not intended to diagnose infection nor to guide or monitor treatment.      Studies: No results found.  Scheduled Meds:  Scheduled Meds: . [START ON 05/27/2015] Influenza vac split quadrivalent PF  0.5 mL Intramuscular Tomorrow-1000  . pantoprazole  40 mg Oral q morning - 10a  . venlafaxine XR  75 mg Oral q morning - 10a   Continuous Infusions:    Time spent on care of this patient: 35 min   [place], MD 05/26/2015, 9:27 AM  LOS: 2 days   [facility] Office  [phone] Pager - Text Page per [email or web address] If 7PM-7AM, please contact night-coverage [email or web address]

## 2015-05-26 NOTE — Op Note (Signed)
[facility] [place], [address]   ERCP PROCEDURE REPORT        EXAM DATE: 05/26/2015  PATIENT NAME:          Tina Briggs, Tina Briggs          MR #: [id number] BIRTHDATE:       Apr 09, 1946     VISIT #:     [phone] ATTENDING:     Teena Irani, MD     STATUS:     inpatient ASSISTANT:      Jerline Pain Amber, [place], [place], and [place], [place]   INDICATIONS:  The patient is a 69 yr old female here for an ERCP due to PROCEDURE PERFORMED:     ERCP was sphincterotomy stone extraction and stent placement MEDICATIONS:     general anesthesia  CONSENT: The patient understands the risks and benefits of the procedure and understands that these risks include, but are not limited to: sedation, allergic reaction, infection, perforation and/or bleeding. Alternative means of evaluation and treatment include, among others: physical exam, x-rays, and/or surgical intervention. The patient elects to proceed with this endoscopic procedure.  DESCRIPTION OF PROCEDURE: During intra-op preparation period all mechanical & medical equipment was checked for proper function. Hand hygiene and appropriate measures for infection prevention was taken. After the risks, benefits and alternatives of the procedure were thoroughly explained, Informed was verified, confirmed and timeout was successfully executed by the treatment team. With the patient in left semi-prone position, medications were administered intravenously.The Pentax videoendoscope      was passed from the mouth into the esophagus and further advanced from the esophagus into the stomach. From stomach scope was directed to the papilla of Vater.     .  Major papilla was aligned with the duodenoscope. The scope position was confirmed fluoroscopically. Rest of the findings/therapeutics are given below. The scope was then completely withdrawn from the patient and the procedure completed. The pulse, BP, and O2 saturation  were monitored and documented by the physician and the nursing staff throughout the entire procedure. The patient was cared for as planned according to standard protocol. The patient was then discharged to recovery in stable condition and with appropriate post procedure care. Estimated blood loss is zero unless otherwise noted in this procedure report.  the papilla of Vater was difficult to find and eventually located under several redundant folds. Deep cannulation was achieved guidewire and then the sphincterotome. A cholangiogram was obtained which showed a dilated duct and cystic duct remnant with apparent 1 or 2 stones in the proximal common bile duct. A 12-15 mm with balloon catheter was advanced after sphincterotomy was made. Performing sphincterotomy was somewhat tenuous due to the overlying folds. The actual sphincterotomy was fairly small. We could visualize a proximal hepatic duct stone and were able to inflate the balloon above it and pull it down but were unable to pull the balloon through inflated. Subsequently we were unable to see a residual stone although he never sought come through the ampulla. We decided to place a 10 French 5 cm stent and when the stent came out of the scope a stone fragment was visible attached to it presumably this was retrieved on previous balloon sweeps and was adherent to the tip of the scope. The stent was placed easily with good bile flow and some debris coming through the stent. The scope was then withdrawn and patient returned to the recovery room in stable condition she tolerated the procedure well  ADVERSE EVENT:     none immediate IMPRESSIONS:     common bile duct stone probably removed with balloon, could not be certain no residual stones left, status post stent placement  RECOMMENDATIONS:     observe overnight for complications and check liver function tests in the morning. REPEAT EXAM:     2-4 months for reevaluation for  retained stones and removal of stent.   ___________________________________ Teena Irani, MD eSigned:  Teena Irani, MD 18-Jun-2015 2:36 PM   cc:  CPT CODES: ICD9 CODES:  The ICD and CPT codes recommended by this software are interpretations from the data that the clinical staff has captured with the software.  The verification of the translation of this report to the ICD and CPT codes and modifiers is the sole responsibility of the health care institution and practicing physician where this report was generated.  [employer] [place]. will not be held responsible for the validity of the ICD and CPT codes included on this report.  AMA assumes no liability for data contained or not contained herein. CPT is a Designer, television/film set of the [employer].   PATIENT NAME:  Tina Briggs, Tina Briggs MR#: [id number]

## 2015-05-26 NOTE — Anesthesia Preprocedure Evaluation (Signed)
Anesthesia Evaluation  Patient identified by MRN, date of birth, ID band Patient awake    Reviewed: Allergy & Precautions, H&P , NPO status , Patient's Chart, lab work & pertinent test results  History of Anesthesia Complications (+) PONV  Airway Mallampati: II  TM Distance: >3 FB Neck ROM: Full    Dental no notable dental hx. (+) Teeth Intact, Dental Advisory Given   Pulmonary neg pulmonary ROS,  breath sounds clear to auscultation  Pulmonary exam normal       Cardiovascular hypertension, Pt. on medications Normal cardiovascular examRhythm:Regular Rate:Normal  LAFB   Neuro/Psych Depression negative neurological ROS     GI/Hepatic Neg liver ROS, GERD-  Medicated and Controlled,  Endo/Other  diabetes, Type 2, Oral Hypoglycemic AgentsMorbid obesity  Renal/GU negative Renal ROS  negative genitourinary   Musculoskeletal   Abdominal   Peds  Hematology negative hematology ROS (+)   Anesthesia Other Findings   Reproductive/Obstetrics negative OB ROS                             Anesthesia Physical Anesthesia Plan  ASA: III  Anesthesia Plan: General   Post-op Pain Management:    Induction: Intravenous  Airway Management Planned: Oral ETT  Additional Equipment:   Intra-op Plan:   Post-operative Plan: Extubation in OR  Informed Consent:   Plan Discussed with: Surgeon  Anesthesia Plan Comments:         Anesthesia Quick Evaluation

## 2015-05-26 NOTE — Transfer of Care (Signed)
Immediate Anesthesia Transfer of Care Note  Patient: Tina Briggs  Procedure(s) Performed: Procedure(s): ENDOSCOPIC RETROGRADE CHOLANGIOPANCREATOGRAPHY (ERCP)   (DOING CASE IN MAIN OR) (N/A)  Patient Location: PACU  Anesthesia Type:General  Level of Consciousness: awake, alert , oriented and patient cooperative  Airway & Oxygen Therapy: Patient Spontanous Breathing and Patient connected to face mask oxygen  Post-op Assessment: Report given to RN, Post -op Vital signs reviewed and stable and Patient moving all extremities X 4  Post vital signs: stable  Last Vitals:  Filed Vitals:   05/26/15 1450  BP:   Pulse:   Temp: 36.7 C  Resp:     Complications: No apparent anesthesia complications

## 2015-05-26 NOTE — Anesthesia Postprocedure Evaluation (Signed)
  Anesthesia Post-op Note  Patient: Tina Briggs  Procedure(s) Performed: Procedure(s) (LRB): ENDOSCOPIC RETROGRADE CHOLANGIOPANCREATOGRAPHY (ERCP)   (DOING CASE IN MAIN OR) (N/A)  Patient Location: PACU  Anesthesia Type: General  Level of Consciousness: awake and alert   Airway and Oxygen Therapy: Patient Spontanous Breathing  Post-op Pain: mild  Post-op Assessment: Post-op Vital signs reviewed, Patient's Cardiovascular Status Stable, Respiratory Function Stable, Patent Airway and No signs of Nausea or vomiting  Last Vitals:  Filed Vitals:   05/26/15 1510  BP: 123/65  Pulse: 73  Temp:   Resp: 21    Post-op Vital Signs: stable   Complications: No apparent anesthesia complications

## 2015-05-27 DIAGNOSIS — R1011 Right upper quadrant pain: Secondary | ICD-10-CM

## 2015-05-27 DIAGNOSIS — I9589 Other hypotension: Secondary | ICD-10-CM

## 2015-05-27 LAB — COMPREHENSIVE METABOLIC PANEL
ALBUMIN: 3.3 g/dL — AB (ref 3.5–5.0)
ALK PHOS: 112 U/L (ref 38–126)
ALT: 90 U/L — AB (ref 14–54)
ANION GAP: 6 (ref 5–15)
AST: 47 U/L — AB (ref 15–41)
BILIRUBIN TOTAL: 0.6 mg/dL (ref 0.3–1.2)
BUN: 18 mg/dL (ref 6–20)
CALCIUM: 8.9 mg/dL (ref 8.9–10.3)
CO2: 27 mmol/L (ref 22–32)
Chloride: 107 mmol/L (ref 101–111)
Creatinine, Ser: 1.4 mg/dL — ABNORMAL HIGH (ref 0.44–1.00)
GFR calc Af Amer: 44 mL/min — ABNORMAL LOW (ref >60)
GFR calc non Af Amer: 38 mL/min — ABNORMAL LOW (ref >60)
GLUCOSE: 121 mg/dL — AB (ref 65–99)
Potassium: 4.2 mmol/L (ref 3.5–5.1)
SODIUM: 140 mmol/L (ref 135–145)
Total Protein: 5.9 g/dL — ABNORMAL LOW (ref 6.5–8.1)

## 2015-05-27 MED ORDER — METFORMIN HCL 1000 MG PO TABS: 1000.0000 mg | ORAL_TABLET | Freq: Two times a day (BID) | ORAL | Status: DC

## 2015-05-27 NOTE — Progress Notes (Signed)
Patient discharged.  Leaving with personal belongings.  Accompanied by husband.  No complaints.  Respirations even and unlabored.  Room air.  Patient told to hold metformin until Monday.

## 2015-05-27 NOTE — Progress Notes (Signed)
[facility] Progress Note  Subjective: The patient is doing well 1 day after her ERCP with sphincterotomy and stone extraction. She denies abdominal pain. She has tolerated clear liquids. She denies nausea or vomiting.  Objective: Vital signs in last 24 hours: Temp:  [97.6 F (36.4 C)-98.9 F (37.2 C)] 98.9 F (37.2 C) (08/27 0409) Pulse Rate:  [71-89] 87 (08/27 0409) Resp:  [15-24] 24 (08/27 0409) BP: (85-156)/(47-79) 104/60 mmHg (08/27 0409) SpO2:  [89 %-100 %] 89 % (08/27 0409) Weight change:    PE:  Nonicteric. In no distress.  Heart regular rhythm  Lungs clear  Abdomen soft and nontender  Lab Results: Results for orders placed or performed during the hospital encounter of 05/24/15 (from the past 24 hour(s))  Glucose, capillary     Status: Abnormal   Collection Time: 05/26/15 12:32 PM  Result Value Ref Range   Glucose-Capillary 111 (H) 65 - 99 mg/dL  Comprehensive metabolic panel     Status: Abnormal   Collection Time: 05/27/15  5:45 AM  Result Value Ref Range   Sodium 140 135 - 145 mmol/L   Potassium 4.2 3.5 - 5.1 mmol/L   Chloride 107 101 - 111 mmol/L   CO2 27 22 - 32 mmol/L   Glucose, Bld 121 (H) 65 - 99 mg/dL   BUN 18 6 - 20 mg/dL   Creatinine, Ser 1.40 (H) 0.44 - 1.00 mg/dL   Calcium 8.9 8.9 - 10.3 mg/dL   Total Protein 5.9 (L) 6.5 - 8.1 g/dL   Albumin 3.3 (L) 3.5 - 5.0 g/dL   AST 47 (H) 15 - 41 U/L   ALT 90 (H) 14 - 54 U/L   Alkaline Phosphatase 112 38 - 126 U/L   Total Bilirubin 0.6 0.3 - 1.2 mg/dL   GFR calc non Af Amer 38 (L) >60 mL/min   GFR calc Af Amer 44 (L) >60 mL/min   Anion gap 6 5 - 15    Studies/Results: Dg Ercp Biliary & Pancreatic Ducts  05/26/2015   CLINICAL DATA:  69 year old female with a history of common bile duct stone.  EXAM: ERCP  TECHNIQUE: Multiple spot images obtained with the fluoroscopic device and submitted for interpretation post-procedure.  FLUOROSCOPY TIME:  Fluoroscopy Time:  4 minutes 46 seconds   COMPARISON:  MRI 05/17/2015  FINDINGS: Spot images during ERCP demonstrates surgical instruments over the upper abdomen, with and a scope in the first/ second portion the duodenum. Surgical changes of cholecystectomy.  Cannulation of the ampulla with retrograde infusion of contrast.  Balloon basket within the common bile duct.  Final images demonstrate a plastic stent through the ampulla with partial evacuation of contrast from the ductal system.  Contrast not visualized to cross into the duodenum.  IMPRESSION: Intraoperative images during ERCP demonstrate balloon basket in the extrahepatic ductal system with no large filling defects identified.  Final image demonstrates plastic stent across the ampulla.  Please refer to the dictated operative report for full details of intraoperative findings and procedure.  Signed,  Dulcy Fanny. Earleen Newport DO  [facility]  [facility]  These images were submitted for radiologic interpretation only. Please see the procedural report for the amount of contrast and the fluoroscopy time utilized.   Electronically Signed   By: Corrie Mckusick D.O.   On: 05/26/2015 15:30      Assessment: Choledocholithiasis status post ERCP with sphincterotomy and stone extraction and biliary stent placement  Plan:   I think she is stable to  be discharged home. She should follow-up with Dr. Amedeo Plenty in a couple of weeks to see how she is doing. He can discuss with her at that time timing of removal of the biliary stent.    Cassell Clement 05/27/2015, 9:10 AM  Pager: [phone] If no answer or after 5 PM call [phone] Lab Results  Component Value Date   HGB 11.2* 05/26/2015   HGB 11.7* 05/25/2015   HGB 12.8 05/24/2015   HCT 34.1* 05/26/2015   HCT 35.2* 05/25/2015   HCT 38.8 05/24/2015   ALKPHOS 112 05/27/2015   ALKPHOS 117 05/25/2015   ALKPHOS 139* 05/24/2015   AST 47* 05/27/2015   AST 28 05/25/2015   AST 66* 05/24/2015   ALT 90*  05/27/2015   ALT 90* 05/25/2015   ALT 138* 05/24/2015

## 2015-05-27 NOTE — Discharge Summary (Signed)
Physician Discharge Summary  MALEIYAH Briggs MEQ:[id number] DOB: January 21, 1946 DOA: 05/24/2015  PCP: Wenda Low, MD  Admit date: 05/24/2015 Discharge date: 05/27/2015  Time spent: 50 minutes  Recommendations for Outpatient Follow-up:  1. Recheck BP and resume Losartan as needed  Discharge Condition: stable  Discharge Diagnoses:  Principal Problem:   Elevated liver enzymes Active Problems:   RUQ abdominal pain   Obesity   Essential hypertension   Hypercholesterolemia   Diabetes mellitus type 2 in obese   History of present illness:  Tina Briggs is a 69 y.o. female with HTN, HLP, obesity, GERD, DM on oral hypoglycemics.  The patient states she has been having GI issues since Feb insistent with upper abdominal pain and nausea. Her PCP and Dr Earle Gell with GI have been working up her symptoms. He had an MRCP last week which was abnormal and she was referred to see Dr. Paulita Fujita. She had not yet seen him. She states that she was asked to come to the ER by PCP. ER note mentioned she was asked to come because of a "elevated lipase". She admits to about 5 lb weight loss in the past few weeks.   Hospital Course:  Principal Problem:  Elevated liver enzymes/nausea/ epigastric and right upper quadrant abdominal pain -MRCP performed on 8/17 reviewed-this shows a distal common bile duct stricture with moderately dilated intra-and extrahepatic bile ducts-4 mm intraductal calculus in the left intrahepatic bile duct -8/26 - ERCP done by Dr Amedeo Plenty- he suspects sone was removed with the balloon, stent was place- see report mentioned below - evaluated by Dr Penelope Coop today who recommends discharge with f/u with Dr Amedeo Plenty in 2 wks. - Dr Amedeo Plenty recommends repeat exam in 2-3 mo for re-eval for retained stones and removal of stent  Active Problems: Large hiatal hernia -Noted on above-mentioned MRCP  ARF? Vs CKD 3 - last Cr was 0.85 in 2009- may have developed CKD since then - holding  Losartan due to hypotension - well hydrated now without much change in Cr therefore doubt this is prerenal   Essential hypertension - cont to hold losartan as she has been hypotensive   Hypercholesterolemia -Hold Crestor for now   Diabetes mellitus type 2 in obese -Sliding-scale insulin ordered- sugars stable -hold Amaryl, Victoza and metformin  GERD - possibly related to large hiatal hernia? - cont Protonix   Procedures: ERCP- sphincterotomy stone extraction -common bile duct stone probably removed with balloon, could not be certain no residual stones left, status post stent placement  Consultations:  GI  Discharge Exam: There were no vitals filed for this visit. Filed Vitals:   05/27/15 0409  BP: 104/60  Pulse: 87  Temp: 98.9 F (37.2 C)  Resp: 24    General: AAO x 3, no distress Cardiovascular: RRR, no murmurs  Respiratory: clear to auscultation bilaterally GI: soft, non-tender, non-distended, bowel sound positive  Discharge Instructions You were cared for by a hospitalist during your hospital stay. If you have any questions about your discharge medications or the care you received while you were in the hospital after you are discharged, you can call the unit and asked to speak with the hospitalist on call if the hospitalist that took care of you is not available. Once you are discharged, your primary care physician will handle any further medical issues. Please note that NO REFILLS for any discharge medications will be authorized once you are discharged, as it is imperative that you return to your primary care physician (or establish  a relationship with a primary care physician if you do not have one) for your aftercare needs so that they can reassess your need for medications and monitor your lab values.      Discharge Instructions    Discharge instructions    Complete by:  As directed   daibetic heart healthy diet     Increase activity slowly    Complete  by:  As directed             Medication List    STOP taking these medications        losartan 100 MG tablet  Commonly known as:  COZAAR      TAKE these medications        CRESTOR 40 MG tablet  Generic drug:  rosuvastatin  TAKE 1 TABLET AT BEDTIME     fluticasone 50 MCG/ACT nasal spray  Commonly known as:  FLONASE  Place 2 sprays into both nostrils daily as needed for allergies.     glimepiride 2 MG tablet  Commonly known as:  AMARYL  Take 1 mg by mouth 2 (two) times daily.     ibuprofen 200 MG tablet  Commonly known as:  ADVIL,MOTRIN  Take 200 mg by mouth every 6 (six) hours as needed for pain, fever or headache.     iron polysaccharides 150 MG capsule  Commonly known as:  NIFEREX  Take 150 mg by mouth every morning.     metFORMIN 1000 MG tablet  Commonly known as:  GLUCOPHAGE  Take 1 tablet (1,000 mg total) by mouth 2 (two) times daily with a meal.     pantoprazole 40 MG tablet  Commonly known as:  PROTONIX  Take 40 mg by mouth every morning.     venlafaxine XR 75 MG 24 hr capsule  Commonly known as:  EFFEXOR-XR  Take 75 mg by mouth every morning.     VICTOZA 18 MG/3ML Sopn  Generic drug:  Liraglutide  Inject 1.2 mg as directed at bedtime.     vitamin B-12 1000 MCG tablet  Commonly known as:  CYANOCOBALAMIN  Take 1,000 mcg by mouth 3 (three) times a week. Take on Sundays, Wednesdays and Fridays.     VITAMIN D PO  Take 1 tablet by mouth every morning.       Allergies  Allergen Reactions  . Codeine Shortness Of Breath  . Tetracyclines & Related   . Azithromycin Itching and Rash  . Erythromycin Itching and Rash  . Morphine And Related Itching and Rash  . Penicillins Itching and Rash  . Septra [Sulfamethoxazole-Trimethoprim] Itching and Rash   Follow-up Information    Follow up with HAYES,JOHN C, MD. Schedule an appointment as soon as possible for a visit in 2 weeks.   Specialty:  Gastroenterology   Contact information:   [address] [place] [address] [phone]        The results of significant diagnostics from this hospitalization (including imaging, microbiology, ancillary and laboratory) are listed below for reference.    Significant Diagnostic Studies: Dg Ercp Biliary & Pancreatic Ducts  05/26/2015   CLINICAL DATA:  69 year old female with a history of common bile duct stone.  EXAM: ERCP  TECHNIQUE: Multiple spot images obtained with the fluoroscopic device and submitted for interpretation post-procedure.  FLUOROSCOPY TIME:  Fluoroscopy Time:  4 minutes 46 seconds  COMPARISON:  MRI 05/17/2015  FINDINGS: Spot images during ERCP demonstrates surgical instruments over the upper abdomen, with and a scope  in the first/ second portion the duodenum. Surgical changes of cholecystectomy.  Cannulation of the ampulla with retrograde infusion of contrast.  Balloon basket within the common bile duct.  Final images demonstrate a plastic stent through the ampulla with partial evacuation of contrast from the ductal system.  Contrast not visualized to cross into the duodenum.  IMPRESSION: Intraoperative images during ERCP demonstrate balloon basket in the extrahepatic ductal system with no large filling defects identified.  Final image demonstrates plastic stent across the ampulla.  Please refer to the dictated operative report for full details of intraoperative findings and procedure.  Signed,  Dulcy Fanny. Earleen Newport DO  [facility]  [facility]  These images were submitted for radiologic interpretation only. Please see the procedural report for the amount of contrast and the fluoroscopy time utilized.   Electronically Signed   By: Corrie Mckusick D.O.   On: 05/26/2015 15:30   Mr Jeananne Rama W/wo Cm/mrcp  05/17/2015   CLINICAL DATA:  69 year old female with multiple episodes of mid to upper right sided abdominal pain since February 2016.  EXAM: MRI ABDOMEN WITHOUT AND WITH CONTRAST (INCLUDING  MRCP)  TECHNIQUE: Multiplanar multisequence MR imaging of the abdomen was performed both before and after the administration of intravenous contrast. Heavily T2-weighted images of the biliary and pancreatic ducts were obtained, and three-dimensional MRCP images were rendered by post processing.  CONTRAST:  91mL MULTIHANCE GADOBENATE DIMEGLUMINE 529 MG/ML IV SOLN  COMPARISON:  No priors.  FINDINGS: BUN and creatinine were obtained on site at [facility] at  [address]  Results:  Creatinine 1.2 mg/dL.  GFR 46  Lower chest:  Large hiatal hernia.  Hepatobiliary: No cystic or solid hepatic lesions. Status post cholecystectomy. MRCP images demonstrate moderate intra and extrahepatic biliary ductal dilatation. In the intrahepatic bile ducts to the left lobe of the liver (image 13 of series 7, image 14 of series 10, and image 16 of series 11) there is a 4 mm filling defect, compatible with intraductal calculus. On several of the T2 weighted sequences, there is an apparent filling defect in the distal common bile duct. However, this is favored to be related to flow artifact in the duct. The narrowing of the distal common bile duct on the MRCP images appears to be extrinsic, and postcontrast images demonstrate narrowing of the lumen, most compatible with a severe distal ductal stricture. No definite mass identified.  Pancreas: No pancreatic mass. No pancreatic ductal dilatation. No pancreatic or peripancreatic fluid or inflammatory changes.  Spleen: Unremarkable.  Adrenals/Urinary Tract: Bilateral adrenal glands are normal in appearance. Right kidney is normal in appearance. In the interpolar region of the left kidney there is a sub cm T1 hypointense, T2 hyperintense, nonenhancing lesion, compatible with a simple cyst.  Stomach/Bowel: The intra abdominal portion of the stomach is normal in appearance. No pathologic dilatation of the visualized portions of small bowel or colon.  Vascular/Lymphatic: No aneurysm  identified in the visualized abdominal vasculature. No lymphadenopathy noted in the abdomen.  Other: No significant volume of ascites noted in the visualized peritoneal cavity.  Musculoskeletal: No aggressive osseous lesions identified.  IMPRESSION: 1. Distal common bile duct stricture with moderate intra and extrahepatic biliary ductal dilatation. 2. 4 mm intraductal calculus in the left intrahepatic bile ducts. 3. Status post cholecystectomy. 4. Additional incidental findings, as above.   Electronically Signed   By: Vinnie Langton M.D.   On: 05/17/2015 11:01    Microbiology: Recent Results (from the past  240 hour(s))  Surgical pcr screen     Status: None   Collection Time: 05/26/15  7:45 AM  Result Value Ref Range Status   MRSA, PCR NEGATIVE NEGATIVE Final   Staphylococcus aureus NEGATIVE NEGATIVE Final    Comment:        The Xpert SA Assay (FDA approved for NASAL specimens in patients over 32 years of age), is one component of a comprehensive surveillance program.  Test performance has been validated by [facility] for patients greater than or equal to 41 year old. It is not intended to diagnose infection nor to guide or monitor treatment.      Labs: Basic Metabolic Panel:  Recent Labs Lab 05/24/15 1520 05/25/15 0520 05/25/15 1410 05/27/15 0545  NA 140 141 139 140  K 4.5 4.6 4.5 4.2  CL 104 108 107 107  CO2 26 26 25 27   GLUCOSE 90 133* 185* 121*  BUN 20 19 19 18   CREATININE 1.40* 1.46* 1.36* 1.40*  CALCIUM 10.0 9.6 9.6 8.9   Liver Function Tests:  Recent Labs Lab 05/24/15 1520 05/25/15 1410 05/27/15 0545  AST 66* 28 47*  ALT 138* 90* 90*  ALKPHOS 139* 117 112  BILITOT 0.5 0.8 0.6  PROT 7.8 7.2 5.9*  ALBUMIN 4.3 3.9 3.3*    Recent Labs Lab 05/24/15 1520  LIPASE 36   No results for input(s): AMMONIA in the last 168 hours. CBC:  Recent Labs Lab 05/24/15 1520 05/25/15 0520 05/26/15 0540  WBC 6.9 5.9 6.0  HGB 12.8 11.7* 11.2*  HCT 38.8 35.2*  34.1*  MCV 87.4 87.1 86.8  PLT 214 196 198   Cardiac Enzymes: No results for input(s): CKTOTAL, CKMB, CKMBINDEX, TROPONINI in the last 168 hours. BNP: BNP (last 3 results) No results for input(s): BNP in the last 8760 hours.  ProBNP (last 3 results) No results for input(s): PROBNP in the last 8760 hours.  CBG:  Recent Labs Lab 05/24/15 1721 05/24/15 2143 05/25/15 0027 05/25/15 0907 05/26/15 1232  GLUCAP 48* 125* 135* 164* 111*       SignedDebbe Odea, MD [facility] 05/27/2015, 10:19 AM

## 2015-05-29 ENCOUNTER — Emergency Department (HOSPITAL_COMMUNITY): Payer: Medicare Other

## 2015-05-29 ENCOUNTER — Encounter (HOSPITAL_COMMUNITY): Payer: Self-pay | Admitting: Emergency Medicine

## 2015-05-29 ENCOUNTER — Emergency Department (HOSPITAL_COMMUNITY)
Admission: EM | Admit: 2015-05-29 | Discharge: 2015-05-29 | Disposition: A | Discharge status: Home / Self Care | Payer: Medicare Other | Attending: Emergency Medicine | Admitting: Emergency Medicine

## 2015-05-29 DIAGNOSIS — I10 Essential (primary) hypertension: Secondary | ICD-10-CM | POA: Diagnosis not present

## 2015-05-29 DIAGNOSIS — M549 Dorsalgia, unspecified: Secondary | ICD-10-CM | POA: Insufficient documentation

## 2015-05-29 DIAGNOSIS — E119 Type 2 diabetes mellitus without complications: Secondary | ICD-10-CM | POA: Insufficient documentation

## 2015-05-29 DIAGNOSIS — Z9289 Personal history of other medical treatment: Secondary | ICD-10-CM | POA: Diagnosis not present

## 2015-05-29 DIAGNOSIS — R109 Unspecified abdominal pain: Secondary | ICD-10-CM | POA: Diagnosis not present

## 2015-05-29 DIAGNOSIS — G8918 Other acute postprocedural pain: Secondary | ICD-10-CM | POA: Insufficient documentation

## 2015-05-29 DIAGNOSIS — F329 Major depressive disorder, single episode, unspecified: Secondary | ICD-10-CM | POA: Diagnosis not present

## 2015-05-29 DIAGNOSIS — K219 Gastro-esophageal reflux disease without esophagitis: Secondary | ICD-10-CM | POA: Insufficient documentation

## 2015-05-29 DIAGNOSIS — Z90710 Acquired absence of both cervix and uterus: Secondary | ICD-10-CM | POA: Insufficient documentation

## 2015-05-29 DIAGNOSIS — E669 Obesity, unspecified: Secondary | ICD-10-CM | POA: Diagnosis not present

## 2015-05-29 DIAGNOSIS — D649 Anemia, unspecified: Secondary | ICD-10-CM | POA: Insufficient documentation

## 2015-05-29 DIAGNOSIS — R1012 Left upper quadrant pain: Secondary | ICD-10-CM | POA: Insufficient documentation

## 2015-05-29 DIAGNOSIS — Z7951 Long term (current) use of inhaled steroids: Secondary | ICD-10-CM | POA: Diagnosis not present

## 2015-05-29 DIAGNOSIS — R945 Abnormal results of liver function studies: Secondary | ICD-10-CM | POA: Diagnosis not present

## 2015-05-29 DIAGNOSIS — Z880 Allergy status to penicillin: Secondary | ICD-10-CM | POA: Insufficient documentation

## 2015-05-29 DIAGNOSIS — K838 Other specified diseases of biliary tract: Secondary | ICD-10-CM | POA: Diagnosis not present

## 2015-05-29 DIAGNOSIS — R748 Abnormal levels of other serum enzymes: Secondary | ICD-10-CM | POA: Diagnosis not present

## 2015-05-29 DIAGNOSIS — Z79899 Other long term (current) drug therapy: Secondary | ICD-10-CM | POA: Diagnosis not present

## 2015-05-29 DIAGNOSIS — R110 Nausea: Secondary | ICD-10-CM | POA: Diagnosis not present

## 2015-05-29 DIAGNOSIS — R1013 Epigastric pain: Secondary | ICD-10-CM | POA: Diagnosis not present

## 2015-05-29 DIAGNOSIS — R52 Pain, unspecified: Secondary | ICD-10-CM

## 2015-05-29 DIAGNOSIS — Z9851 Tubal ligation status: Secondary | ICD-10-CM | POA: Diagnosis not present

## 2015-05-29 DIAGNOSIS — Z9049 Acquired absence of other specified parts of digestive tract: Secondary | ICD-10-CM | POA: Diagnosis not present

## 2015-05-29 LAB — CBC WITH DIFFERENTIAL/PLATELET
Basophils Absolute: 0 10*3/uL (ref 0.0–0.1)
Basophils Relative: 0 % (ref 0–1)
EOS ABS: 0 10*3/uL (ref 0.0–0.7)
EOS PCT: 1 % (ref 0–5)
HCT: 34.5 % — ABNORMAL LOW (ref 36.0–46.0)
Hemoglobin: 11.4 g/dL — ABNORMAL LOW (ref 12.0–15.0)
LYMPHS ABS: 0.5 10*3/uL — AB (ref 0.7–4.0)
Lymphocytes Relative: 11 % — ABNORMAL LOW (ref 12–46)
MCH: 28.8 pg (ref 26.0–34.0)
MCHC: 33 g/dL (ref 30.0–36.0)
MCV: 87.1 fL (ref 78.0–100.0)
MONO ABS: 0.4 10*3/uL (ref 0.1–1.0)
MONOS PCT: 9 % (ref 3–12)
Neutro Abs: 3.6 10*3/uL (ref 1.7–7.7)
Neutrophils Relative %: 79 % — ABNORMAL HIGH (ref 43–77)
PLATELETS: 175 10*3/uL (ref 150–400)
RBC: 3.96 MIL/uL (ref 3.87–5.11)
RDW: 13.9 % (ref 11.5–15.5)
WBC: 4.6 10*3/uL (ref 4.0–10.5)

## 2015-05-29 LAB — COMPREHENSIVE METABOLIC PANEL
ALT: 571 U/L — ABNORMAL HIGH (ref 14–54)
ANION GAP: 11 (ref 5–15)
AST: 952 U/L — ABNORMAL HIGH (ref 15–41)
Albumin: 3.7 g/dL (ref 3.5–5.0)
Alkaline Phosphatase: 283 U/L — ABNORMAL HIGH (ref 38–126)
BUN: 20 mg/dL (ref 6–20)
CALCIUM: 8.9 mg/dL (ref 8.9–10.3)
CHLORIDE: 105 mmol/L (ref 101–111)
CO2: 23 mmol/L (ref 22–32)
Creatinine, Ser: 1.32 mg/dL — ABNORMAL HIGH (ref 0.44–1.00)
GFR, EST AFRICAN AMERICAN: 47 mL/min — AB (ref >60)
GFR, EST NON AFRICAN AMERICAN: 40 mL/min — AB (ref >60)
Glucose, Bld: 263 mg/dL — ABNORMAL HIGH (ref 65–99)
Potassium: 3.5 mmol/L (ref 3.5–5.1)
SODIUM: 139 mmol/L (ref 135–145)
Total Bilirubin: 1.4 mg/dL — ABNORMAL HIGH (ref 0.3–1.2)
Total Protein: 6.8 g/dL (ref 6.5–8.1)

## 2015-05-29 LAB — LIPASE, BLOOD: LIPASE: 55 U/L — AB (ref 22–51)

## 2015-05-29 LAB — GLUCOSE, CAPILLARY: GLUCOSE-CAPILLARY: 191 mg/dL — AB (ref 65–99)

## 2015-05-29 MED ORDER — HYDROCODONE-ACETAMINOPHEN 5-325 MG PO TABS
1.0000 | ORAL_TABLET | Freq: Once | ORAL | Status: AC
  Administered 2015-05-29: 1 via ORAL
  Filled 2015-05-29: qty 1

## 2015-05-29 MED ORDER — ONDANSETRON 4 MG PO TBDP: 4.0000 mg | ORAL_TABLET | Freq: Three times a day (TID) | ORAL | Status: DC | PRN

## 2015-05-29 MED ORDER — FENTANYL CITRATE (PF) 100 MCG/2ML IJ SOLN
50.0000 ug | Freq: Once | INTRAMUSCULAR | Status: AC
  Administered 2015-05-29: 50 ug via INTRAVENOUS
  Filled 2015-05-29: qty 2

## 2015-05-29 MED ORDER — SODIUM CHLORIDE 0.9 % IV SOLN
Freq: Once | INTRAVENOUS | Status: AC
  Administered 2015-05-29: 03:00:00 via INTRAVENOUS

## 2015-05-29 MED ORDER — ONDANSETRON HCL 4 MG/2ML IJ SOLN
4.0000 mg | Freq: Once | INTRAMUSCULAR | Status: AC
  Administered 2015-05-29: 4 mg via INTRAVENOUS
  Filled 2015-05-29: qty 2

## 2015-05-29 MED ORDER — HYDROCODONE-ACETAMINOPHEN 5-325 MG PO TABS: 1.0000 | ORAL_TABLET | ORAL | Status: DC | PRN

## 2015-05-29 NOTE — ED Notes (Addendum)
Pt from home c/o abdominal pain that radiates to back. She reports nausea and 3  Episodes of vomiting. Pt had an ERCP on 8/24 and a stent was placed. Pt appears pale. She reports each step with walking causes more pain.

## 2015-05-29 NOTE — Discharge Instructions (Signed)
Abdominal Pain Many things can cause belly (abdominal) pain. Most times, the belly pain is not dangerous. Many cases of belly pain can be watched and treated at home. HOME CARE   Do not take medicines that help you go poop (laxatives) unless told to by your doctor.  Only take medicine as told by your doctor.  Eat or drink as told by your doctor. Your doctor will tell you if you should be on a special diet. GET HELP IF:  You do not know what is causing your belly pain.  You have belly pain while you are sick to your stomach (nauseous) or have runny poop (diarrhea).  You have pain while you pee or poop.  Your belly pain wakes you up at night.  You have belly pain that gets worse or better when you eat.  You have belly pain that gets worse when you eat fatty foods.  You have a fever. GET HELP RIGHT AWAY IF:   The pain does not go away within 2 hours.  You keep throwing up (vomiting).  The pain changes and is only in the right or left part of the belly.  You have bloody or tarry looking poop. MAKE SURE YOU:   Understand these instructions.  Will watch your condition.  Will get help right away if you are not doing well or get worse. Document Released: 03/04/2008 Document Revised: 09/21/2013 Document Reviewed: 05/26/2013 [facility] Patient Information 2015 [place], [place]. This information is not intended to replace advice given to you by your health care provider. Make sure you discuss any questions you have with your health care provider. Today your lab work shows that you have elevated liver enzymes but no elevated white count, your ultrasound shows that your stent is in place.  You do not have any gallstones blocking the flow to explain your discomfort.  Your x-ray shows that you do not have any free air.  Please contact Dr. Amedeo Plenty today for further instructions and evaluation.  You have been given prescriptions for pain control and nausea control.  Please take these as needed

## 2015-05-29 NOTE — ED Notes (Signed)
Ultrasound at bedside

## 2015-05-29 NOTE — ED Notes (Signed)
Patient unable to void at this time. 2nd request.

## 2015-05-29 NOTE — ED Notes (Signed)
Pt returned from radiology.

## 2015-05-29 NOTE — ED Provider Notes (Addendum)
CSN: [id number]     Arrival date & time 05/29/15  0223 History   First MD Initiated Contact with Patient 05/29/15 0232     Chief Complaint  Patient presents with  . Abdominal Pain  . Back Pain     (Consider location/radiation/quality/duration/timing/severity/associated sxs/prior Treatment) HPI Comments: Patient had ERCP for common bile duct stone and stent placement on August 24.  Presents tonight with worsening discomfort, pain, nausea, no vomiting.  Patient is a 69 y.o. female presenting with abdominal pain and back pain. The history is provided by the patient.  Abdominal Pain Pain location:  Epigastric and RUQ Pain quality: aching   Pain radiates to:  Does not radiate Pain severity:  Moderate Onset quality:  Gradual Timing:  Constant Progression:  Worsening Chronicity:  Recurrent Context: previous surgery   Relieved by:  Nothing Worsened by:  Movement and palpation Ineffective treatments:  None tried Associated symptoms: nausea   Associated symptoms: no vomiting   Back Pain Associated symptoms: abdominal pain     Past Medical History  Diagnosis Date  . HYPERTENSION, UNSPECIFIED   . HYPERLIPIDEMIA-MIXED   . OBESITY   . GERD   . CATARACT, RIGHT EYE   . Depression   . Anemia   . PONV (postoperative nausea and vomiting)     past history only  . DM    Past Surgical History  Procedure Laterality Date  . Appendectomy    . Tubal ligation    . Cholecystectomy      open  . Abdominal hysterectomy    . Back surgery      lumbar '09"microdiscectomy"  . Esophagogastroduodenoscopy (egd) with propofol N/A 11/22/2014    Procedure: ESOPHAGOGASTRODUODENOSCOPY (EGD) WITH PROPOFOL;  Surgeon: Garlan Fair, MD;  Location: [facility] ENDOSCOPY;  Service: Endoscopy;  Laterality: N/A;  . Colonoscopy with propofol N/A 11/22/2014    Procedure: COLONOSCOPY WITH PROPOFOL;  Surgeon: Garlan Fair, MD;  Location: [facility] ENDOSCOPY;  Service: Endoscopy;  Laterality: N/A;  . Ercp N/A 05/26/2015     Procedure: ENDOSCOPIC RETROGRADE CHOLANGIOPANCREATOGRAPHY (ERCP)   (DOING CASE IN MAIN OR);  Surgeon: Teena Irani, MD;  Location: [facility];  Service: Gastroenterology;  Laterality: N/A;  . Ercp N/A 06/06/2015    Procedure: ENDOSCOPIC RETROGRADE CHOLANGIOPANCREATOGRAPHY (ERCP);  Surgeon: Clarene Essex, MD;  Location: [facility];  Service: Endoscopy;  Laterality: N/A;   No family history on file. Social History  Substance Use Topics  . Smoking status: Never Smoker   . Smokeless tobacco: None  . Alcohol Use: No   OB History    No data available     Review of Systems  Gastrointestinal: Positive for nausea and abdominal pain. Negative for vomiting.  Musculoskeletal: Positive for back pain.      Allergies  Codeine; Hydrocodone; Tetracyclines & related; Azithromycin; Erythromycin; Morphine and related; Penicillins; and Septra  Home Medications   Prior to Admission medications   Medication Sig Start Date End Date Taking? Authorizing Provider  Cholecalciferol (VITAMIN D PO) Take 1 tablet by mouth every morning.   Yes Historical Provider, MD  glimepiride (AMARYL) 2 MG tablet Take 1 mg by mouth 2 (two) times daily.    Yes Historical Provider, MD  ibuprofen (ADVIL,MOTRIN) 200 MG tablet Take 400 mg by mouth every 6 (six) hours as needed for pain, fever or headache.    Yes Historical Provider, MD  iron polysaccharides (NIFEREX) 150 MG capsule Take 150 mg by mouth every morning.   Yes Historical Provider, MD  metFORMIN (GLUCOPHAGE) 1000  MG tablet Take 1 tablet (1,000 mg total) by mouth 2 (two) times daily with a meal. 05/27/15  Yes Debbe Odea, MD  pantoprazole (PROTONIX) 40 MG tablet Take 40 mg by mouth every morning.    Yes Historical Provider, MD  venlafaxine XR (EFFEXOR-XR) 75 MG 24 hr capsule Take 75 mg by mouth every morning.    Yes Historical Provider, MD  VICTOZA 18 MG/3ML SOPN Inject 1.2 mg as directed at bedtime.  10/03/14  Yes Historical Provider, MD  vitamin B-12 (CYANOCOBALAMIN) 1000  MCG tablet Take 1,000 mcg by mouth 3 (three) times a week. Take on Sundays, Wednesdays and Fridays.   Yes Historical Provider, MD  acetaminophen (TYLENOL) 500 MG tablet Take 1,000 mg by mouth daily as needed for headache.    Historical Provider, MD  CRESTOR 40 MG tablet TAKE 1 TABLET AT BEDTIME 04/19/13   Renella Cunas, MD  fluticasone [facility]) 50 MCG/ACT nasal spray Place 2 sprays into both nostrils daily as needed for allergies.     Historical Provider, MD  guaiFENesin-dextromethorphan (ROBITUSSIN DM) 100-10 MG/5ML syrup Take 5 mLs by mouth every 4 (four) hours as needed for cough. 06/07/15   Debbe Odea, MD  levofloxacin (LEVAQUIN) 750 MG tablet Take 1 tablet (750 mg total) by mouth daily. 06/08/15   Debbe Odea, MD  losartan (COZAAR) 100 MG tablet Take 1 tablet by mouth daily. 04/19/15   Historical Provider, MD  metroNIDAZOLE (FLAGYL) 500 MG tablet Take 1 tablet (500 mg total) by mouth every 6 (six) hours. 06/07/15   Debbe Odea, MD  ondansetron (ZOFRAN ODT) 4 MG disintegrating tablet Take 1 tablet (4 mg total) by mouth every 8 (eight) hours as needed for nausea or vomiting. 05/29/15   Junius Creamer, NP   BP 120/74 mmHg  Pulse 80  Temp(Src) 98.9 F (37.2 C) (Oral)  Resp 20  SpO2 94% Physical Exam  Constitutional: She appears well-developed.  HENT:  Head: Normocephalic.  Eyes: Pupils are equal, round, and reactive to light.  Neck: Normal range of motion.  Cardiovascular: Normal rate.   Pulmonary/Chest: Effort normal.  Abdominal: Soft. She exhibits no distension. There is tenderness in the epigastric area and left upper quadrant.  Musculoskeletal: Normal range of motion.  Neurological: She is alert.  Skin: Skin is warm.  Nursing note and vitals reviewed.   ED Course  Procedures (including critical care time) Labs Review Labs Reviewed  CBC WITH DIFFERENTIAL/PLATELET - Abnormal; Notable for the following:    Hemoglobin 11.4 (*)    HCT 34.5 (*)    Neutrophils Relative % 79 (*)     Lymphocytes Relative 11 (*)    Lymphs Abs 0.5 (*)    All other components within normal limits  COMPREHENSIVE METABOLIC PANEL - Abnormal; Notable for the following:    Glucose, Bld 263 (*)    Creatinine, Ser 1.32 (*)    AST 952 (*)    ALT 571 (*)    Alkaline Phosphatase 283 (*)    Total Bilirubin 1.4 (*)    GFR calc non Af Amer 40 (*)    GFR calc Af Amer 47 (*)    All other components within normal limits  LIPASE, BLOOD - Abnormal; Notable for the following:    Lipase 55 (*)    All other components within normal limits    Imaging Review No results found. I have personally reviewed and evaluated these images and lab results as part of my medical decision-making.   EKG Interpretation None  Labs and ultrasound have been reviewed with the patient.  She is more comfortable after receiving fentanyl IV.  She'll be discharged home with Vicodin and Zofran for pain and nausea control.  She will make contact with Dr. Amedeo Plenty today for further instructions and evaluation MDM   Final diagnoses:  Pain  Post-operative pain  Elevated liver enzymes         Junius Creamer, NP 05/29/15 3888  Everlene Balls, MD 05/29/15 0630  Junius Creamer, NP 06/27/15 [place], MD 06/27/15 [phone]

## 2015-05-29 NOTE — ED Notes (Signed)
Pt is aware a urine sample is needed but unable to void at this time.

## 2015-05-30 ENCOUNTER — Encounter (HOSPITAL_COMMUNITY): Payer: Self-pay | Admitting: Gastroenterology

## 2015-06-02 ENCOUNTER — Encounter (HOSPITAL_COMMUNITY): Payer: Self-pay | Admitting: Emergency Medicine

## 2015-06-02 ENCOUNTER — Inpatient Hospital Stay (HOSPITAL_COMMUNITY)
Admission: EM | Admit: 2015-06-02 | Discharge: 2015-06-07 | DRG: 408 | Disposition: A | Discharge status: Home / Self Care | Payer: Medicare Other | Attending: Internal Medicine | Admitting: Internal Medicine

## 2015-06-02 ENCOUNTER — Emergency Department (HOSPITAL_COMMUNITY): Payer: Medicare Other

## 2015-06-02 DIAGNOSIS — Z881 Allergy status to other antibiotic agents status: Secondary | ICD-10-CM | POA: Diagnosis not present

## 2015-06-02 DIAGNOSIS — R109 Unspecified abdominal pain: Secondary | ICD-10-CM | POA: Diagnosis present

## 2015-06-02 DIAGNOSIS — I129 Hypertensive chronic kidney disease with stage 1 through stage 4 chronic kidney disease, or unspecified chronic kidney disease: Secondary | ICD-10-CM | POA: Diagnosis present

## 2015-06-02 DIAGNOSIS — R197 Diarrhea, unspecified: Secondary | ICD-10-CM | POA: Diagnosis present

## 2015-06-02 DIAGNOSIS — Z880 Allergy status to penicillin: Secondary | ICD-10-CM | POA: Diagnosis not present

## 2015-06-02 DIAGNOSIS — R7989 Other specified abnormal findings of blood chemistry: Secondary | ICD-10-CM | POA: Diagnosis present

## 2015-06-02 DIAGNOSIS — Z794 Long term (current) use of insulin: Secondary | ICD-10-CM | POA: Diagnosis not present

## 2015-06-02 DIAGNOSIS — Z9049 Acquired absence of other specified parts of digestive tract: Secondary | ICD-10-CM | POA: Diagnosis present

## 2015-06-02 DIAGNOSIS — E669 Obesity, unspecified: Secondary | ICD-10-CM | POA: Diagnosis present

## 2015-06-02 DIAGNOSIS — E1122 Type 2 diabetes mellitus with diabetic chronic kidney disease: Secondary | ICD-10-CM | POA: Diagnosis not present

## 2015-06-02 DIAGNOSIS — J690 Pneumonitis due to inhalation of food and vomit: Secondary | ICD-10-CM | POA: Diagnosis not present

## 2015-06-02 DIAGNOSIS — K219 Gastro-esophageal reflux disease without esophagitis: Secondary | ICD-10-CM | POA: Diagnosis present

## 2015-06-02 DIAGNOSIS — Z6830 Body mass index (BMI) 30.0-30.9, adult: Secondary | ICD-10-CM

## 2015-06-02 DIAGNOSIS — Z885 Allergy status to narcotic agent status: Secondary | ICD-10-CM | POA: Diagnosis not present

## 2015-06-02 DIAGNOSIS — E785 Hyperlipidemia, unspecified: Secondary | ICD-10-CM | POA: Diagnosis present

## 2015-06-02 DIAGNOSIS — E860 Dehydration: Secondary | ICD-10-CM | POA: Diagnosis present

## 2015-06-02 DIAGNOSIS — R079 Chest pain, unspecified: Secondary | ICD-10-CM | POA: Diagnosis not present

## 2015-06-02 DIAGNOSIS — I10 Essential (primary) hypertension: Secondary | ICD-10-CM | POA: Diagnosis present

## 2015-06-02 DIAGNOSIS — K5730 Diverticulosis of large intestine without perforation or abscess without bleeding: Secondary | ICD-10-CM | POA: Diagnosis not present

## 2015-06-02 DIAGNOSIS — Z79899 Other long term (current) drug therapy: Secondary | ICD-10-CM | POA: Diagnosis not present

## 2015-06-02 DIAGNOSIS — K830 Cholangitis: Secondary | ICD-10-CM | POA: Diagnosis not present

## 2015-06-02 DIAGNOSIS — N183 Chronic kidney disease, stage 3 unspecified: Secondary | ICD-10-CM

## 2015-06-02 DIAGNOSIS — R932 Abnormal findings on diagnostic imaging of liver and biliary tract: Secondary | ICD-10-CM | POA: Diagnosis not present

## 2015-06-02 DIAGNOSIS — K8050 Calculus of bile duct without cholangitis or cholecystitis without obstruction: Secondary | ICD-10-CM | POA: Diagnosis not present

## 2015-06-02 DIAGNOSIS — R1013 Epigastric pain: Secondary | ICD-10-CM

## 2015-06-02 DIAGNOSIS — K8051 Calculus of bile duct without cholangitis or cholecystitis with obstruction: Secondary | ICD-10-CM | POA: Diagnosis not present

## 2015-06-02 DIAGNOSIS — E1169 Type 2 diabetes mellitus with other specified complication: Secondary | ICD-10-CM

## 2015-06-02 DIAGNOSIS — Z791 Long term (current) use of non-steroidal anti-inflammatories (NSAID): Secondary | ICD-10-CM

## 2015-06-02 DIAGNOSIS — R740 Nonspecific elevation of levels of transaminase and lactic acid dehydrogenase [LDH]: Secondary | ICD-10-CM | POA: Diagnosis not present

## 2015-06-02 DIAGNOSIS — Z4659 Encounter for fitting and adjustment of other gastrointestinal appliance and device: Secondary | ICD-10-CM | POA: Diagnosis not present

## 2015-06-02 DIAGNOSIS — R1011 Right upper quadrant pain: Secondary | ICD-10-CM | POA: Diagnosis not present

## 2015-06-02 DIAGNOSIS — K8080 Other cholelithiasis without obstruction: Secondary | ICD-10-CM | POA: Diagnosis not present

## 2015-06-02 DIAGNOSIS — R112 Nausea with vomiting, unspecified: Secondary | ICD-10-CM | POA: Diagnosis not present

## 2015-06-02 DIAGNOSIS — R059 Cough, unspecified: Secondary | ICD-10-CM

## 2015-06-02 DIAGNOSIS — D649 Anemia, unspecified: Secondary | ICD-10-CM | POA: Diagnosis present

## 2015-06-02 DIAGNOSIS — K8030 Calculus of bile duct with cholangitis, unspecified, without obstruction: Secondary | ICD-10-CM | POA: Diagnosis not present

## 2015-06-02 DIAGNOSIS — R05 Cough: Secondary | ICD-10-CM

## 2015-06-02 LAB — CBC WITH DIFFERENTIAL/PLATELET
Basophils Absolute: 0 10*3/uL (ref 0.0–0.1)
Basophils Relative: 0 % (ref 0–1)
Eosinophils Absolute: 0.1 10*3/uL (ref 0.0–0.7)
Eosinophils Relative: 1 % (ref 0–5)
HEMATOCRIT: 34 % — AB (ref 36.0–46.0)
HEMOGLOBIN: 11.2 g/dL — AB (ref 12.0–15.0)
LYMPHS ABS: 0.6 10*3/uL — AB (ref 0.7–4.0)
Lymphocytes Relative: 8 % — ABNORMAL LOW (ref 12–46)
MCH: 28.7 pg (ref 26.0–34.0)
MCHC: 32.9 g/dL (ref 30.0–36.0)
MCV: 87.2 fL (ref 78.0–100.0)
MONOS PCT: 8 % (ref 3–12)
Monocytes Absolute: 0.6 10*3/uL (ref 0.1–1.0)
NEUTROS ABS: 6.3 10*3/uL (ref 1.7–7.7)
NEUTROS PCT: 83 % — AB (ref 43–77)
Platelets: 199 10*3/uL (ref 150–400)
RBC: 3.9 MIL/uL (ref 3.87–5.11)
RDW: 14.2 % (ref 11.5–15.5)
WBC: 7.5 10*3/uL (ref 4.0–10.5)

## 2015-06-02 LAB — GLUCOSE, CAPILLARY
Glucose-Capillary: 81 mg/dL (ref 65–99)
Glucose-Capillary: 120 mg/dL — ABNORMAL HIGH (ref 65–99)

## 2015-06-02 LAB — COMPREHENSIVE METABOLIC PANEL
ALBUMIN: 3.6 g/dL (ref 3.5–5.0)
ALK PHOS: 429 U/L — AB (ref 38–126)
ALT: 230 U/L — AB (ref 14–54)
ANION GAP: 10 (ref 5–15)
AST: 149 U/L — ABNORMAL HIGH (ref 15–41)
BILIRUBIN TOTAL: 2.6 mg/dL — AB (ref 0.3–1.2)
BUN: 14 mg/dL (ref 6–20)
CALCIUM: 9 mg/dL (ref 8.9–10.3)
CO2: 24 mmol/L (ref 22–32)
CREATININE: 1.32 mg/dL — AB (ref 0.44–1.00)
Chloride: 105 mmol/L (ref 101–111)
GFR calc non Af Amer: 40 mL/min — ABNORMAL LOW (ref >60)
GFR, EST AFRICAN AMERICAN: 47 mL/min — AB (ref >60)
GLUCOSE: 178 mg/dL — AB (ref 65–99)
Potassium: 4 mmol/L (ref 3.5–5.1)
Sodium: 139 mmol/L (ref 135–145)
TOTAL PROTEIN: 7.2 g/dL (ref 6.5–8.1)

## 2015-06-02 LAB — LIPASE, BLOOD: Lipase: 32 U/L (ref 22–51)

## 2015-06-02 MED ORDER — ENOXAPARIN SODIUM 40 MG/0.4ML ~~LOC~~ SOLN
40.0000 mg | SUBCUTANEOUS | Status: DC
  Administered 2015-06-02 – 2015-06-06 (×5): 40 mg via SUBCUTANEOUS
  Filled 2015-06-02 (×5): qty 0.4

## 2015-06-02 MED ORDER — IOHEXOL 300 MG/ML  SOLN
50.0000 mL | Freq: Once | INTRAMUSCULAR | Status: DC | PRN
  Administered 2015-06-02: 50 mL via ORAL
  Filled 2015-06-02: qty 50

## 2015-06-02 MED ORDER — PANTOPRAZOLE SODIUM 40 MG PO TBEC
40.0000 mg | DELAYED_RELEASE_TABLET | Freq: Every morning | ORAL | Status: DC
  Administered 2015-06-03 – 2015-06-07 (×5): 40 mg via ORAL
  Filled 2015-06-02 (×5): qty 1

## 2015-06-02 MED ORDER — ACETAMINOPHEN 650 MG RE SUPP: 650.0000 mg | Freq: Four times a day (QID) | RECTAL | Status: DC | PRN

## 2015-06-02 MED ORDER — SODIUM CHLORIDE 0.9 % IV SOLN
INTRAVENOUS | Status: DC
  Administered 2015-06-02 – 2015-06-04 (×5): via INTRAVENOUS
  Administered 2015-06-04: 1000 mL via INTRAVENOUS
  Administered 2015-06-05 – 2015-06-06 (×4): via INTRAVENOUS

## 2015-06-02 MED ORDER — INSULIN ASPART 100 UNIT/ML ~~LOC~~ SOLN
0.0000 [IU] | Freq: Three times a day (TID) | SUBCUTANEOUS | Status: DC
  Administered 2015-06-03: 2 [IU] via SUBCUTANEOUS
  Administered 2015-06-04 (×2): 1 [IU] via SUBCUTANEOUS
  Administered 2015-06-05: 2 [IU] via SUBCUTANEOUS
  Administered 2015-06-05 – 2015-06-06 (×2): 1 [IU] via SUBCUTANEOUS

## 2015-06-02 MED ORDER — IOHEXOL 300 MG/ML  SOLN
100.0000 mL | Freq: Once | INTRAMUSCULAR | Status: AC | PRN
Start: 2015-06-02 — End: 2015-06-02
  Administered 2015-06-02: 80 mL via INTRAVENOUS

## 2015-06-02 MED ORDER — LIRAGLUTIDE 18 MG/3ML ~~LOC~~ SOPN: 1.2000 mg | PEN_INJECTOR | Freq: Every day | SUBCUTANEOUS | Status: DC

## 2015-06-02 MED ORDER — ONDANSETRON HCL 4 MG/2ML IJ SOLN
4.0000 mg | Freq: Four times a day (QID) | INTRAMUSCULAR | Status: DC | PRN
  Administered 2015-06-03 – 2015-06-06 (×3): 4 mg via INTRAVENOUS
  Filled 2015-06-02 (×2): qty 2

## 2015-06-02 MED ORDER — HYDROMORPHONE HCL 1 MG/ML IJ SOLN
1.0000 mg | Freq: Once | INTRAMUSCULAR | Status: AC
  Administered 2015-06-02: 1 mg via INTRAVENOUS
  Filled 2015-06-02: qty 1

## 2015-06-02 MED ORDER — ONDANSETRON HCL 4 MG/2ML IJ SOLN
4.0000 mg | Freq: Once | INTRAMUSCULAR | Status: AC
  Administered 2015-06-02: 4 mg via INTRAVENOUS
  Filled 2015-06-02: qty 2

## 2015-06-02 MED ORDER — FLUTICASONE PROPIONATE 50 MCG/ACT NA SUSP: 2.0000 | Freq: Every day | NASAL | Status: DC | PRN

## 2015-06-02 MED ORDER — ONDANSETRON HCL 4 MG PO TABS: 4.0000 mg | ORAL_TABLET | Freq: Four times a day (QID) | ORAL | Status: DC | PRN

## 2015-06-02 MED ORDER — VENLAFAXINE HCL ER 75 MG PO CP24
75.0000 mg | ORAL_CAPSULE | Freq: Every morning | ORAL | Status: DC
  Administered 2015-06-03 – 2015-06-07 (×5): 75 mg via ORAL
  Filled 2015-06-02 (×6): qty 1

## 2015-06-02 MED ORDER — SODIUM CHLORIDE 0.9 % IV BOLUS (SEPSIS)
1000.0000 mL | Freq: Once | INTRAVENOUS | Status: AC
  Administered 2015-06-02: 1000 mL via INTRAVENOUS

## 2015-06-02 MED ORDER — ACETAMINOPHEN 325 MG PO TABS
650.0000 mg | ORAL_TABLET | Freq: Four times a day (QID) | ORAL | Status: DC | PRN
  Administered 2015-06-02 – 2015-06-06 (×4): 650 mg via ORAL
  Filled 2015-06-02 (×4): qty 2

## 2015-06-02 NOTE — ED Provider Notes (Signed)
CSN: [id number]     Arrival date & time 06/02/15  1025 History   First MD Initiated Contact with Patient 06/02/15 1053     Chief Complaint  Patient presents with  . Abdominal Pain  . Chest Pain     (Consider location/radiation/quality/duration/timing/severity/associated sxs/prior Treatment) HPI Comments: Patient here complaining of worsening epigastric abdominal pain that began after she had an ERCP last week. She endorses nonbilious vomiting without fever or chills. Pain is sharp and worse with movement. Denies any dyspnea. No leg pain or swelling. No urinary symptoms. No diarrhea noted. Has been using her home medications without relief. She is status post cholecystectomy. Called her gastroenterologist, Dr. Amedeo Plenty, and told to come here  Patient is a 69 y.o. female presenting with abdominal pain and chest pain. The history is provided by the patient.  Abdominal Pain Associated symptoms: chest pain   Chest Pain Associated symptoms: abdominal pain     Past Medical History  Diagnosis Date  . HYPERTENSION, UNSPECIFIED   . HYPERLIPIDEMIA-MIXED   . OBESITY   . GERD   . DM   . CATARACT, RIGHT EYE   . Depression   . Anemia   . PONV (postoperative nausea and vomiting)     past history only   Past Surgical History  Procedure Laterality Date  . Appendectomy    . Tubal ligation    . Cholecystectomy      open  . Abdominal hysterectomy    . Back surgery      lumbar '09"microdiscectomy"  . Esophagogastroduodenoscopy (egd) with propofol N/A 11/22/2014    Procedure: ESOPHAGOGASTRODUODENOSCOPY (EGD) WITH PROPOFOL;  Surgeon: Garlan Fair, MD;  Location: [facility];  Service: Endoscopy;  Laterality: N/A;  . Colonoscopy with propofol N/A 11/22/2014    Procedure: COLONOSCOPY WITH PROPOFOL;  Surgeon: Garlan Fair, MD;  Location: [facility];  Service: Endoscopy;  Laterality: N/A;  . Ercp N/A 05/26/2015    Procedure: ENDOSCOPIC RETROGRADE CHOLANGIOPANCREATOGRAPHY (ERCP)   (DOING CASE  IN MAIN OR);  Surgeon: Teena Irani, MD;  Location: [facility];  Service: Gastroenterology;  Laterality: N/A;   No family history on file. Social History  Substance Use Topics  . Smoking status: Never Smoker   . Smokeless tobacco: None  . Alcohol Use: No   OB History    No data available     Review of Systems  Cardiovascular: Positive for chest pain.  Gastrointestinal: Positive for abdominal pain.  All other systems reviewed and are negative.     Allergies  Codeine; Tetracyclines & related; Azithromycin; Erythromycin; Morphine and related; Penicillins; and Septra  Home Medications   Prior to Admission medications   Medication Sig Start Date End Date Taking? Authorizing Provider  Cholecalciferol (VITAMIN D PO) Take 1 tablet by mouth every morning.    Historical Provider, MD  CRESTOR 40 MG tablet TAKE 1 TABLET AT BEDTIME Patient not taking: Reported on 05/29/2015 04/19/13   Renella Cunas, MD  fluticasone [facility]) 50 MCG/ACT nasal spray Place 2 sprays into both nostrils daily as needed for allergies.     Historical Provider, MD  glimepiride (AMARYL) 2 MG tablet Take 1 mg by mouth 2 (two) times daily.     Historical Provider, MD  HYDROcodone-acetaminophen (NORCO/VICODIN) 5-325 MG per tablet Take 1 tablet by mouth every 4 (four) hours as needed for moderate pain. 05/29/15   Junius Creamer, NP  ibuprofen (ADVIL,MOTRIN) 200 MG tablet Take 200 mg by mouth every 6 (six) hours as needed for pain, fever  or headache.     Historical Provider, MD  iron polysaccharides (NIFEREX) 150 MG capsule Take 150 mg by mouth every morning.    Historical Provider, MD  metFORMIN (GLUCOPHAGE) 1000 MG tablet Take 1 tablet (1,000 mg total) by mouth 2 (two) times daily with a meal. 05/27/15   Debbe Odea, MD  ondansetron (ZOFRAN ODT) 4 MG disintegrating tablet Take 1 tablet (4 mg total) by mouth every 8 (eight) hours as needed for nausea or vomiting. 05/29/15   Junius Creamer, NP  pantoprazole (PROTONIX) 40 MG tablet  Take 40 mg by mouth every morning.     Historical Provider, MD  rosuvastatin (CRESTOR) 40 MG tablet Take 40 mg by mouth at bedtime.    Historical Provider, MD  venlafaxine XR (EFFEXOR-XR) 75 MG 24 hr capsule Take 75 mg by mouth every morning.     Historical Provider, MD  VICTOZA 18 MG/3ML SOPN Inject 1.2 mg as directed at bedtime.  10/03/14   Historical Provider, MD  vitamin B-12 (CYANOCOBALAMIN) 1000 MCG tablet Take 1,000 mcg by mouth 3 (three) times a week. Take on Sundays, Wednesdays and Fridays.    Historical Provider, MD   BP 119/59 mmHg  Pulse 83  Temp(Src) 97.7 F (36.5 C) (Oral)  Resp 15  Wt 166 lb (75.297 kg)  SpO2 98% Physical Exam  Constitutional: She is oriented to person, place, and time. She appears well-developed and well-nourished.  Non-toxic appearance. No distress.  HENT:  Head: Normocephalic and atraumatic.  Eyes: Conjunctivae, EOM and lids are normal. Pupils are equal, round, and reactive to light.  Neck: Normal range of motion. Neck supple. No tracheal deviation present. No thyroid mass present.  Cardiovascular: Normal rate, regular rhythm and normal heart sounds.  Exam reveals no gallop.   No murmur heard. Pulmonary/Chest: Effort normal and breath sounds normal. No stridor. No respiratory distress. She has no decreased breath sounds. She has no wheezes. She has no rhonchi. She has no rales.  Abdominal: Soft. Normal appearance and bowel sounds are normal. She exhibits no distension. There is tenderness in the epigastric area. There is guarding. There is no rigidity, no rebound and no CVA tenderness.    Musculoskeletal: Normal range of motion. She exhibits no edema or tenderness.  Neurological: She is alert and oriented to person, place, and time. She has normal strength. No cranial nerve deficit or sensory deficit. GCS eye subscore is 4. GCS verbal subscore is 5. GCS motor subscore is 6.  Skin: Skin is warm and dry. No abrasion and no rash noted.  Psychiatric: She has a  normal mood and affect. Her speech is normal and behavior is normal.  Nursing note and vitals reviewed.   ED Course  Procedures (including critical care time) Labs Review Labs Reviewed  CBC WITH DIFFERENTIAL/PLATELET  COMPREHENSIVE METABOLIC PANEL  LIPASE, BLOOD    Imaging Review No results found. I have personally reviewed and evaluated these images and lab results as part of my medical decision-making.   EKG Interpretation   Date/Time:  Friday June 02 2015 10:30:48 EDT Ventricular Rate:  84 PR Interval:  145 QRS Duration: 89 QT Interval:  377 QTC Calculation: 446 R Axis:   -30 Text Interpretation:  Sinus rhythm Left axis deviation Abnormal R-wave  progression, early transition Baseline wander in lead(s) V2 V6 Confirmed  by Terrelle Ruffolo  MD, Mauro Arps ([id number]) on 06/02/2015 11:12:32 AM      MDM   Final diagnoses:  None   Patient given IV fluids and pain medication  here. Patient feels better. Patient's liver function tests are more elevated. Case discussed with gastroenterology on-call recommends admission to the internal medicine service for further workup     Lacretia Leigh, MD 06/02/15 1437

## 2015-06-02 NOTE — H&P (Signed)
[facility] History and Physical  Tina Briggs ZHG:[id number] DOB: [birth year] DOA: 06/02/2015  Referring physician: EDP PCP: Wenda Low, MD   Chief Complaint: PERsistent nausea, vomiting and abdominal pain with diarrhea since she was discharged.   HPI: Tina Briggs is a 69 y.o. female with prior h/o hypertension, diabetes mellitus, presented to ED on 8/24 initially with RUQ abdominal pain and was found to have elevated LFTS'. She underwent ERCP by Dr Amedeo Plenty and multiple stones were removed and a stent was put in for CBD stricture. She was discharged on 8/27. But she reported persistent nausea, vomiting and abdominal pain, RUQ , associated with loose watery diarrhea. On arrival to ED, she was found ot be dehydrated and her LFTS' were much better than on discharge. She was referred to medical service for admission.    Review of Systems:  Constitutional:  No weight loss, night sweats, Fevers, chills, fatigue.  HEENT:  No headaches, Difficulty swallowing,Tooth/dental problems,Sore throat,  No sneezing, itching, ear ache, nasal congestion, post nasal drip,  Cardio-vascular:  No chest pain, Orthopnea, PND, swelling in lower extremities, anasarca, dizziness, palpitations  GI:  Nausea, vomiting, right upper quadrant abdominal pain associated with loose watery diarrhea.  Resp:  No shortness of breath with exertion or at rest. No excess mucus, no productive cough, No non-productive cough, No coughing up of blood.No change in color of mucus.No wheezing.No chest wall deformity  Skin:  no rash or lesions.  GU:  no dysuria, change in color of urine, no urgency or frequency. No flank pain.  Musculoskeletal:  No joint pain or swelling. No decreased range of motion. No back pain.  Psych:  No change in mood or affect. No depression or anxiety. No memory loss.   Past Medical History  Diagnosis Date  . HYPERTENSION, UNSPECIFIED   . HYPERLIPIDEMIA-MIXED   . OBESITY   . GERD     . CATARACT, RIGHT EYE   . Depression   . Anemia   . PONV (postoperative nausea and vomiting)     past history only  . DM    Past Surgical History  Procedure Laterality Date  . Appendectomy    . Tubal ligation    . Cholecystectomy      open  . Abdominal hysterectomy    . Back surgery      lumbar '09"microdiscectomy"  . Esophagogastroduodenoscopy (egd) with propofol N/A 11/22/2014    Procedure: ESOPHAGOGASTRODUODENOSCOPY (EGD) WITH PROPOFOL;  Surgeon: Garlan Fair, MD;  Location: [facility];  Service: Endoscopy;  Laterality: N/A;  . Colonoscopy with propofol N/A 11/22/2014    Procedure: COLONOSCOPY WITH PROPOFOL;  Surgeon: Garlan Fair, MD;  Location: [facility];  Service: Endoscopy;  Laterality: N/A;  . Ercp N/A 05/26/2015    Procedure: ENDOSCOPIC RETROGRADE CHOLANGIOPANCREATOGRAPHY (ERCP)   (DOING CASE IN MAIN OR);  Surgeon: Teena Irani, MD;  Location: [facility];  Service: Gastroenterology;  Laterality: N/A;   Social History:  reports that she has never smoked. She does not have any smokeless tobacco history on file. She reports that she does not drink alcohol or use illicit drugs.  Allergies  Allergen Reactions  . Codeine Shortness Of Breath  . Hydrocodone Itching  . Tetracyclines & Related Itching  . Azithromycin Itching and Rash  . Erythromycin Itching and Rash  . Morphine And Related Itching and Rash  . Penicillins Itching and Rash  . Septra [Sulfamethoxazole-Trimethoprim] Itching and Rash    No family history on file.  do not leave blank  Prior to Admission medications   Medication Sig Start Date End Date Taking? Authorizing Provider  acetaminophen (TYLENOL) 500 MG tablet Take 1,000 mg by mouth daily as needed for headache.   Yes Historical Provider, MD  Cholecalciferol (VITAMIN D PO) Take 1 tablet by mouth every morning.   Yes Historical Provider, MD  CRESTOR 40 MG tablet TAKE 1 TABLET AT BEDTIME 04/19/13  Yes Renella Cunas, MD  fluticasone (FLONASE) 50  MCG/ACT nasal spray Place 2 sprays into both nostrils daily as needed for allergies.    Yes Historical Provider, MD  glimepiride (AMARYL) 2 MG tablet Take 1 mg by mouth 2 (two) times daily.    Yes Historical Provider, MD  ibuprofen (ADVIL,MOTRIN) 200 MG tablet Take 400 mg by mouth every 6 (six) hours as needed for pain, fever or headache.    Yes Historical Provider, MD  iron polysaccharides (NIFEREX) 150 MG capsule Take 150 mg by mouth every morning.   Yes Historical Provider, MD  losartan (COZAAR) 100 MG tablet Take 1 tablet by mouth daily. 04/19/15  Yes Historical Provider, MD  metFORMIN (GLUCOPHAGE) 1000 MG tablet Take 1 tablet (1,000 mg total) by mouth 2 (two) times daily with a meal. 05/27/15  Yes Debbe Odea, MD  ondansetron (ZOFRAN ODT) 4 MG disintegrating tablet Take 1 tablet (4 mg total) by mouth every 8 (eight) hours as needed for nausea or vomiting. 05/29/15  Yes Junius Creamer, NP  pantoprazole (PROTONIX) 40 MG tablet Take 40 mg by mouth every morning.    Yes Historical Provider, MD  venlafaxine XR (EFFEXOR-XR) 75 MG 24 hr capsule Take 75 mg by mouth every morning.    Yes Historical Provider, MD  VICTOZA 18 MG/3ML SOPN Inject 1.2 mg as directed at bedtime.  10/03/14  Yes Historical Provider, MD  vitamin B-12 (CYANOCOBALAMIN) 1000 MCG tablet Take 1,000 mcg by mouth 3 (three) times a week. Take on Sundays, Wednesdays and Fridays.   Yes Historical Provider, MD  HYDROcodone-acetaminophen (NORCO/VICODIN) 5-325 MG per tablet Take 1 tablet by mouth every 4 (four) hours as needed for moderate pain. Patient not taking: Reported on 06/02/2015 05/29/15   Junius Creamer, NP   Physical Exam: Filed Vitals:   06/02/15 1247 06/02/15 1419 06/02/15 1600 06/02/15 1721  BP: 107/51 145/82 113/55 102/51  Pulse: 73 92 82 83  Temp: 98.1 F (36.7 C) 99 F (37.2 C)  100.3 F (37.9 C)  TempSrc: Oral Oral  Oral  Resp: 18 20 22 20   Height:    5\' 3"  (1.6 m)  Weight:    77.338 kg (170 lb 8 oz)  SpO2: 97% 100% 97% 97%     Wt Readings from Last 3 Encounters:  06/02/15 77.338 kg (170 lb 8 oz)  11/22/14 78.472 kg (173 lb)  11/01/14 80.287 kg (177 lb)    General:  Appears calm and comfortable Eyes: PERRL, normal lids, irises & conjunctiva ENT: grossly normal hearing, lips & tongue Neck: no LAD, masses or thyromegaly Cardiovascular: RRR, no m/r/g. No LE edema. Telemetry: SR, no arrhythmias  Respiratory: CTA bilaterally, no w/r/r. Normal respiratory effort. Abdomen: soft, tender in the right upper quadrant , non distended.  Skin: no rash or induration seen on limited exam Musculoskeletal: grossly normal tone BUE/BLE Psychiatric: grossly normal mood and affect, speech fluent and appropriate Neurologic: grossly non-focal.          Labs on Admission:  Basic Metabolic Panel:  Recent Labs Lab 05/27/15 0545 05/29/15 0255 06/02/15 1140  NA 140 139 139  K 4.2 3.5 4.0  CL 107 105 105  CO2 27 23 24   GLUCOSE 121* 263* 178*  BUN 18 20 14   CREATININE 1.40* 1.32* 1.32*  CALCIUM 8.9 8.9 9.0   Liver Function Tests:  Recent Labs Lab 05/27/15 0545 05/29/15 0255 06/02/15 1140  AST 47* 952* 149*  ALT 90* 571* 230*  ALKPHOS 112 283* 429*  BILITOT 0.6 1.4* 2.6*  PROT 5.9* 6.8 7.2  ALBUMIN 3.3* 3.7 3.6    Recent Labs Lab 05/29/15 0255 06/02/15 1140  LIPASE 55* 32   No results for input(s): AMMONIA in the last 168 hours. CBC:  Recent Labs Lab 05/29/15 0255 06/02/15 1140  WBC 4.6 7.5  NEUTROABS 3.6 6.3  HGB 11.4* 11.2*  HCT 34.5* 34.0*  MCV 87.1 87.2  PLT 175 199   Cardiac Enzymes: No results for input(s): CKTOTAL, CKMB, CKMBINDEX, TROPONINI in the last 168 hours.  BNP (last 3 results) No results for input(s): BNP in the last 8760 hours.  ProBNP (last 3 results) No results for input(s): PROBNP in the last 8760 hours.  CBG:  Recent Labs Lab 06/02/15 1753  GLUCAP 81    Radiological Exams on Admission: Ct Abdomen Pelvis W Contrast  06/02/2015   CLINICAL DATA:  Recent ERCP  and stent placement. Patient complains of back and abdominal pain. History of choledocholithiasis. Elevated alkaline phosphatase and total bilirubin levels.  EXAM: CT ABDOMEN AND PELVIS WITH CONTRAST  TECHNIQUE: Multidetector CT imaging of the abdomen and pelvis was performed using the standard protocol following bolus administration of intravenous contrast.  CONTRAST:  42mL OMNIPAQUE IOHEXOL 300 MG/ML  SOLN  COMPARISON:  MR 05/17/2015  FINDINGS: Mild volume loss at the lung bases. No pleural effusions. No evidence for free intra-abdominal air.  There is extensive pneumobilia. The extrahepatic bile duct is dilated measuring up to 9 mm on sequence 2, image 28. Extrahepatic biliary dilatation has not significantly changed from the prior MRI. There is a non metallic stent in the common bile duct that extends into the duodenum. There continues to be moderate intrahepatic biliary dilatation. Central left hepatic duct measures up to 1.4 cm and previously measured 1.0 cm. Gallbladder has been removed. No focal liver lesions and the portal venous system is patent.  Normal appearance of the spleen. No pancreatic duct dilatation. Normal appearance of the bilateral adrenal glands. Normal appearance of both kidneys. Subtle cystic structure in the left kidney interpolar region is better characterized on the prior MRI. Distention of the urinary bladder.  Again noted is a moderate-sized hiatal hernia. No acute abnormality involving the stomach or duodenum.  No significant free fluid or lymphadenopathy. Uterus is absent. Diverticulosis in the left colon without acute inflammatory changes involving the colon. No acute abnormalities involving the small bowel.  No acute bone abnormality.  Periumbilical hernia containing fat.  IMPRESSION: Interval placement of a common bile duct stent. There is extensive pneumobilia related to the biliary stent. There may be slightly increased intrahepatic biliary dilatation despite placement of the  stent.  Extensive diverticulosis in left colon without acute colonic inflammation.   Electronically Signed   By: Markus Daft M.D.   On: 06/02/2015 14:00    EKG: sinus rhythm.   Assessment/Plan Active Problems:   Abdominal pain   Nausea & vomiting   Persistent Nausea, vomiting and abdominal pain with associated diarrhea: - unclear etiology.  - symptomatic management with IV fluids , IV anti emetics and pain control.  - c -diff PCR ordered.  - GI  consulted to see if she needs another ERCP to remove the stent.  Clear liquid diet.  - CT abdomen reviewed , showed pneumobilia.   Hypertension:  Well controlled.   Diabetes Mellitus: CBG (last 3)   Recent Labs  06/02/15 1753  GLUCAP 81    Holding the metformin.  Resume SSI.   Dehydration: Gentle hydration.   Code Status: full code.  DVT Prophylaxis: Family Communication: familya t bedside.  Disposition Plan: admit to med surg.   Time spent: 60 min  [facility] Pager [phone]

## 2015-06-02 NOTE — ED Notes (Signed)
RN Starting IV 

## 2015-06-02 NOTE — ED Notes (Signed)
Report called to [place], Therapist, sports.  SBAR reviewed, all questions answered.  Pt to be transported to room 1336.

## 2015-06-02 NOTE — Progress Notes (Deleted)
[facility] called dme specialist for [facility] and spoke to [place] who reports patient has received her walker on 08/30.

## 2015-06-02 NOTE — ED Notes (Signed)
Pt had ERCP last Friday to remove gall stones from bile duct and had stint placed. Since the procedure she has had back pain, abdominal pain and chest pain. Reports n/v/d. Right side sharp flank pain that radiates to right chest and back with general abdominal discomfort. Denies blood in bowel or vomit.

## 2015-06-03 ENCOUNTER — Inpatient Hospital Stay (HOSPITAL_COMMUNITY): Payer: Medicare Other

## 2015-06-03 ENCOUNTER — Encounter (HOSPITAL_COMMUNITY): Payer: Self-pay | Admitting: Gastroenterology

## 2015-06-03 LAB — COMPREHENSIVE METABOLIC PANEL
ALBUMIN: 2.8 g/dL — AB (ref 3.5–5.0)
ALT: 150 U/L — AB (ref 14–54)
AST: 53 U/L — AB (ref 15–41)
Alkaline Phosphatase: 309 U/L — ABNORMAL HIGH (ref 38–126)
Anion gap: 5 (ref 5–15)
BILIRUBIN TOTAL: 1.2 mg/dL (ref 0.3–1.2)
BUN: 10 mg/dL (ref 6–20)
CHLORIDE: 109 mmol/L (ref 101–111)
CO2: 25 mmol/L (ref 22–32)
CREATININE: 1.21 mg/dL — AB (ref 0.44–1.00)
Calcium: 8.2 mg/dL — ABNORMAL LOW (ref 8.9–10.3)
GFR calc Af Amer: 52 mL/min — ABNORMAL LOW (ref >60)
GFR, EST NON AFRICAN AMERICAN: 45 mL/min — AB (ref >60)
GLUCOSE: 87 mg/dL (ref 65–99)
Potassium: 3.5 mmol/L (ref 3.5–5.1)
Sodium: 139 mmol/L (ref 135–145)
Total Protein: 5.9 g/dL — ABNORMAL LOW (ref 6.5–8.1)

## 2015-06-03 LAB — CBC
HEMATOCRIT: 30.6 % — AB (ref 36.0–46.0)
Hemoglobin: 9.7 g/dL — ABNORMAL LOW (ref 12.0–15.0)
MCH: 27.8 pg (ref 26.0–34.0)
MCHC: 31.7 g/dL (ref 30.0–36.0)
MCV: 87.7 fL (ref 78.0–100.0)
PLATELETS: 171 10*3/uL (ref 150–400)
RBC: 3.49 MIL/uL — ABNORMAL LOW (ref 3.87–5.11)
RDW: 14.3 % (ref 11.5–15.5)
WBC: 5.2 10*3/uL (ref 4.0–10.5)

## 2015-06-03 LAB — GLUCOSE, CAPILLARY
GLUCOSE-CAPILLARY: 82 mg/dL (ref 65–99)
GLUCOSE-CAPILLARY: 84 mg/dL (ref 65–99)
GLUCOSE-CAPILLARY: 92 mg/dL (ref 65–99)
Glucose-Capillary: 184 mg/dL — ABNORMAL HIGH (ref 65–99)

## 2015-06-03 LAB — PROTIME-INR
INR: 1.18 (ref 0.00–1.49)
Prothrombin Time: 15.1 seconds (ref 11.6–15.2)

## 2015-06-03 LAB — IRON AND TIBC
IRON: 21 ug/dL — AB (ref 28–170)
SATURATION RATIOS: 8 % — AB (ref 10.4–31.8)
TIBC: 267 ug/dL (ref 250–450)
UIBC: 246 ug/dL

## 2015-06-03 LAB — C DIFFICILE QUICK SCREEN W PCR REFLEX
C DIFFICILE (CDIFF) INTERP: NEGATIVE
C DIFFICILE (CDIFF) TOXIN: NEGATIVE
C Diff antigen: NEGATIVE

## 2015-06-03 LAB — FOLATE: FOLATE: 16.9 ng/mL (ref >5.9)

## 2015-06-03 LAB — VITAMIN B12: VITAMIN B 12: 1114 pg/mL — AB (ref 180–914)

## 2015-06-03 LAB — FERRITIN: Ferritin: 47 ng/mL (ref 11–307)

## 2015-06-03 LAB — RETICULOCYTES
RBC.: 3.48 MIL/uL — ABNORMAL LOW (ref 3.87–5.11)
Retic Count, Absolute: 55.7 10*3/uL (ref 19.0–186.0)
Retic Ct Pct: 1.6 % (ref 0.4–3.1)

## 2015-06-03 MED ORDER — SODIUM CHLORIDE 0.9 % IV SOLN
500.0000 mg | Freq: Three times a day (TID) | INTRAVENOUS | Status: DC
  Administered 2015-06-03 – 2015-06-07 (×11): 500 mg via INTRAVENOUS
  Filled 2015-06-03 (×13): qty 500

## 2015-06-03 MED ORDER — GUAIFENESIN-DM 100-10 MG/5ML PO SYRP: 5.0000 mL | ORAL_SOLUTION | ORAL | Status: DC | PRN

## 2015-06-03 MED ORDER — HYDROMORPHONE HCL 1 MG/ML IJ SOLN
1.0000 mg | INTRAMUSCULAR | Status: DC | PRN
  Administered 2015-06-03 – 2015-06-06 (×6): 1 mg via INTRAVENOUS
  Filled 2015-06-03 (×6): qty 1

## 2015-06-03 NOTE — Consult Note (Signed)
Reason for Consult: Right upper quadrant pain abnormal liver tests Referring Physician: Hospital team  Tina Briggs is an 69 y.o. female.  HPI: Patient seen and examined and discussed with the hospital team and her hospital computer chart was reviewed as was her ultrasound CT MRCP and ERCP and she's been having right upper quadrant pain off and on for about a year rarely with chills and low-grade fever recently with vomiting 1 time and occasional nausea and the pain can last anywhere from a few seconds to a few minutes and she has no other complaints and it seems to be roughly the same as it was prior to her ERCP and things like vacuuming can bring it on and her case was discussed with her husband and son as well and she has no other complaints  Past Medical History  Diagnosis Date  . HYPERTENSION, UNSPECIFIED   . HYPERLIPIDEMIA-MIXED   . OBESITY   . GERD   . CATARACT, RIGHT EYE   . Depression   . Anemia   . PONV (postoperative nausea and vomiting)     past history only  . DM     Past Surgical History  Procedure Laterality Date  . Appendectomy    . Tubal ligation    . Cholecystectomy      open  . Abdominal hysterectomy    . Back surgery      lumbar '09"microdiscectomy"  . Esophagogastroduodenoscopy (egd) with propofol N/A 11/22/2014    Procedure: ESOPHAGOGASTRODUODENOSCOPY (EGD) WITH PROPOFOL;  Surgeon: Garlan Fair, MD;  Location: [facility];  Service: Endoscopy;  Laterality: N/A;  . Colonoscopy with propofol N/A 11/22/2014    Procedure: COLONOSCOPY WITH PROPOFOL;  Surgeon: Garlan Fair, MD;  Location: [facility];  Service: Endoscopy;  Laterality: N/A;  . Ercp N/A 05/26/2015    Procedure: ENDOSCOPIC RETROGRADE CHOLANGIOPANCREATOGRAPHY (ERCP)   (DOING CASE IN MAIN OR);  Surgeon: Teena Irani, MD;  Location: [facility];  Service: Gastroenterology;  Laterality: N/A;    History reviewed. No pertinent family history.  Social History:  reports that she has never  smoked. She does not have any smokeless tobacco history on file. She reports that she does not drink alcohol or use illicit drugs.  Allergies:  Allergies  Allergen Reactions  . Codeine Shortness Of Breath  . Hydrocodone Itching  . Tetracyclines & Related Itching  . Azithromycin Itching and Rash  . Erythromycin Itching and Rash  . Morphine And Related Itching and Rash  . Penicillins Itching and Rash  . Septra [Sulfamethoxazole-Trimethoprim] Itching and Rash    Medications: I have reviewed the patient's current medications.  Results for orders placed or performed during the hospital encounter of 06/02/15 (from the past 48 hour(s))  CBC with Differential     Status: Abnormal   Collection Time: 06/02/15 11:40 AM  Result Value Ref Range   WBC 7.5 4.0 - 10.5 K/uL   RBC 3.90 3.87 - 5.11 MIL/uL   Hemoglobin 11.2 (L) 12.0 - 15.0 g/dL   HCT 34.0 (L) 36.0 - 46.0 %   MCV 87.2 78.0 - 100.0 fL   MCH 28.7 26.0 - 34.0 pg   MCHC 32.9 30.0 - 36.0 g/dL   RDW 14.2 11.5 - 15.5 %   Platelets 199 150 - 400 K/uL   Neutrophils Relative % 83 (H) 43 - 77 %   Neutro Abs 6.3 1.7 - 7.7 K/uL   Lymphocytes Relative 8 (L) 12 - 46 %   Lymphs Abs 0.6 (L) 0.7 -  4.0 K/uL   Monocytes Relative 8 3 - 12 %   Monocytes Absolute 0.6 0.1 - 1.0 K/uL   Eosinophils Relative 1 0 - 5 %   Eosinophils Absolute 0.1 0.0 - 0.7 K/uL   Basophils Relative 0 0 - 1 %   Basophils Absolute 0.0 0.0 - 0.1 K/uL  Comprehensive metabolic panel     Status: Abnormal   Collection Time: 06/02/15 11:40 AM  Result Value Ref Range   Sodium 139 135 - 145 mmol/L   Potassium 4.0 3.5 - 5.1 mmol/L   Chloride 105 101 - 111 mmol/L   CO2 24 22 - 32 mmol/L   Glucose, Bld 178 (H) 65 - 99 mg/dL   BUN 14 6 - 20 mg/dL   Creatinine, Ser 1.32 (H) 0.44 - 1.00 mg/dL   Calcium 9.0 8.9 - 10.3 mg/dL   Total Protein 7.2 6.5 - 8.1 g/dL   Albumin 3.6 3.5 - 5.0 g/dL   AST 149 (H) 15 - 41 U/L   ALT 230 (H) 14 - 54 U/L   Alkaline Phosphatase 429 (H) 38 - 126  U/L   Total Bilirubin 2.6 (H) 0.3 - 1.2 mg/dL   GFR calc non Af Amer 40 (L) >60 mL/min   GFR calc Af Amer 47 (L) >60 mL/min    Comment: (NOTE) The eGFR has been calculated using the CKD EPI equation. This calculation has not been validated in all clinical situations. eGFR's persistently <60 mL/min signify possible Chronic Kidney Disease.    Anion gap 10 5 - 15  Lipase, blood     Status: None   Collection Time: 06/02/15 11:40 AM  Result Value Ref Range   Lipase 32 22 - 51 U/L  Glucose, capillary     Status: None   Collection Time: 06/02/15  5:53 PM  Result Value Ref Range   Glucose-Capillary 81 65 - 99 mg/dL   Comment 1 Notify RN    Comment 2 Document in Chart   Glucose, capillary     Status: Abnormal   Collection Time: 06/02/15  8:50 PM  Result Value Ref Range   Glucose-Capillary 120 (H) 65 - 99 mg/dL   Comment 1 Notify RN   Comprehensive metabolic panel     Status: Abnormal   Collection Time: 06/03/15  4:50 AM  Result Value Ref Range   Sodium 139 135 - 145 mmol/L   Potassium 3.5 3.5 - 5.1 mmol/L   Chloride 109 101 - 111 mmol/L   CO2 25 22 - 32 mmol/L   Glucose, Bld 87 65 - 99 mg/dL   BUN 10 6 - 20 mg/dL   Creatinine, Ser 1.21 (H) 0.44 - 1.00 mg/dL   Calcium 8.2 (L) 8.9 - 10.3 mg/dL   Total Protein 5.9 (L) 6.5 - 8.1 g/dL   Albumin 2.8 (L) 3.5 - 5.0 g/dL   AST 53 (H) 15 - 41 U/L   ALT 150 (H) 14 - 54 U/L   Alkaline Phosphatase 309 (H) 38 - 126 U/L   Total Bilirubin 1.2 0.3 - 1.2 mg/dL   GFR calc non Af Amer 45 (L) >60 mL/min   GFR calc Af Amer 52 (L) >60 mL/min    Comment: (NOTE) The eGFR has been calculated using the CKD EPI equation. This calculation has not been validated in all clinical situations. eGFR's persistently <60 mL/min signify possible Chronic Kidney Disease.    Anion gap 5 5 - 15  CBC     Status: Abnormal   Collection  Time: 06/03/15  4:50 AM  Result Value Ref Range   WBC 5.2 4.0 - 10.5 K/uL   RBC 3.49 (L) 3.87 - 5.11 MIL/uL   Hemoglobin 9.7 (L)  12.0 - 15.0 g/dL   HCT 30.6 (L) 36.0 - 46.0 %   MCV 87.7 78.0 - 100.0 fL   MCH 27.8 26.0 - 34.0 pg   MCHC 31.7 30.0 - 36.0 g/dL   RDW 14.3 11.5 - 15.5 %   Platelets 171 150 - 400 K/uL  Protime-INR     Status: None   Collection Time: 06/03/15  4:50 AM  Result Value Ref Range   Prothrombin Time 15.1 11.6 - 15.2 seconds   INR 1.18 0.00 - 1.49  Glucose, capillary     Status: None   Collection Time: 06/03/15  7:32 AM  Result Value Ref Range   Glucose-Capillary 82 65 - 99 mg/dL   Comment 1 Notify RN    Comment 2 Document in Chart   Glucose, capillary     Status: Abnormal   Collection Time: 06/03/15 11:40 AM  Result Value Ref Range   Glucose-Capillary 184 (H) 65 - 99 mg/dL   Comment 1 Notify RN    Comment 2 Document in Chart     Dg Chest 2 View  06/03/2015   CLINICAL DATA:  Right-sided chest pain for 1 week  EXAM: CHEST  2 VIEW  COMPARISON:  05/29/2015  FINDINGS: Normal cardiac silhouette. Small hiatal hernia noted. Small focus of airspace disease in the RIGHT lower lobe. No consolidation. No pneumothorax.  IMPRESSION: Small focus of airspace disease in the RIGHT lower lobe suggests pneumonia or aspiration pneumonitis. This is a subtle finding.  Small hiatal hernia.   Electronically Signed   By: Suzy Bouchard M.D.   On: 06/03/2015 11:16   Ct Abdomen Pelvis W Contrast  06/02/2015   CLINICAL DATA:  Recent ERCP and stent placement. Patient complains of back and abdominal pain. History of choledocholithiasis. Elevated alkaline phosphatase and total bilirubin levels.  EXAM: CT ABDOMEN AND PELVIS WITH CONTRAST  TECHNIQUE: Multidetector CT imaging of the abdomen and pelvis was performed using the standard protocol following bolus administration of intravenous contrast.  CONTRAST:  69m OMNIPAQUE IOHEXOL 300 MG/ML  SOLN  COMPARISON:  MR 05/17/2015  FINDINGS: Mild volume loss at the lung bases. No pleural effusions. No evidence for free intra-abdominal air.  There is extensive pneumobilia. The  extrahepatic bile duct is dilated measuring up to 9 mm on sequence 2, image 28. Extrahepatic biliary dilatation has not significantly changed from the prior MRI. There is a non metallic stent in the common bile duct that extends into the duodenum. There continues to be moderate intrahepatic biliary dilatation. Central left hepatic duct measures up to 1.4 cm and previously measured 1.0 cm. Gallbladder has been removed. No focal liver lesions and the portal venous system is patent.  Normal appearance of the spleen. No pancreatic duct dilatation. Normal appearance of the bilateral adrenal glands. Normal appearance of both kidneys. Subtle cystic structure in the left kidney interpolar region is better characterized on the prior MRI. Distention of the urinary bladder.  Again noted is a moderate-sized hiatal hernia. No acute abnormality involving the stomach or duodenum.  No significant free fluid or lymphadenopathy. Uterus is absent. Diverticulosis in the left colon without acute inflammatory changes involving the colon. No acute abnormalities involving the small bowel.  No acute bone abnormality.  Periumbilical hernia containing fat.  IMPRESSION: Interval placement of a common bile duct  stent. There is extensive pneumobilia related to the biliary stent. There may be slightly increased intrahepatic biliary dilatation despite placement of the stent.  Extensive diverticulosis in left colon without acute colonic inflammation.   Electronically Signed   By: Markus Daft M.D.   On: 06/02/2015 14:00    ROS negative except above Blood pressure 105/62, pulse 64, temperature 97.7 F (36.5 C), temperature source Oral, resp. rate 16, height _0  (1.6 m), weight 77.338 kg (170 lb 8 oz), SpO2 100 %. Physical Exam Vital signs stable afebrile no acute distress lungs clear regular rate and rhythm moves all extremities well abdomen is pertinent for very minimal midepigastric and right upper quadrant discomfort soft good bowel sounds  otherwise x-rays and labs reviewed white count okay slight decreased hemoglobin LFTs minimally elevated Assessment/Plan: Questionable residual CBD stone and distal CBD stricture questionable etiology Plan: She will need a repeat ERCP at some point and she could probably go home later today or tomorrow and we could schedule it next week as an outpatient and I will talk to the endoscopy team later today and the risks benefits methods as well as possible spyglass possible dilation and possible restenting as well as removing residual stones was extensively discussed and will check on tomorrow or  They will call me to discuss the scheduling of the procedure  [facility] E 06/03/2015, 12:19 PM

## 2015-06-03 NOTE — Progress Notes (Signed)
[facility] PROGRESS NOTE  Tina Briggs STM:[id number] DOB: 07-Aug-1946 DOA: 06/02/2015 PCP: Wenda Low, MD  Assessment/Plan: 1. Choledocholithiasis, s/p ERCP with sphincterotomy and stone extraction and stent placement for biliary stricture; - symptoms of nausea, vomiting and abdominal pain improved.  - advance diet as tolerated.  - Gi consulted and recommendations given.    Hypertension well controlled.   Diabetes Mellitus: CBG (last 3)   Recent Labs  06/02/15 2050 06/03/15 0732 06/03/15 1140  GLUCAP 120* 82 184*    Resume SSI.    DEHYDRATION; improved with hydration.   Code Status: full code.  Family Communication: none at bedside Disposition Plan: pending.    Consultants:  Gastroenterology.   Procedures:  none  Antibiotics:  none  HPI/Subjective: Nausea improved, no vomiting, persistent RUQ PAIN.   Objective: Filed Vitals:   06/03/15 1400  BP: 116/57  Pulse: 60  Temp: 97.8 F (36.6 C)  Resp: 16    Intake/Output Summary (Last 24 hours) at 06/03/15 1555 Last data filed at 06/03/15 1117  Gross per 24 hour  Intake   1380 ml  Output   2850 ml  Net  -1470 ml   Filed Weights   06/02/15 1032 06/02/15 1721  Weight: 75.297 kg (166 lb) 77.338 kg (170 lb 8 oz)    Exam:   General:  Alert afebrile comfortable  Cardiovascular: s1s2  Respiratory: ctab  Abdomen: soft mildly tender RUQ , not distended   Musculoskeletal: no pedal edema.   Data Reviewed: Basic Metabolic Panel:  Recent Labs Lab 05/29/15 0255 06/02/15 1140 06/03/15 0450  NA 139 139 139  K 3.5 4.0 3.5  CL 105 105 109  CO2 23 24 25   GLUCOSE 263* 178* 87  BUN 20 14 10   CREATININE 1.32* 1.32* 1.21*  CALCIUM 8.9 9.0 8.2*   Liver Function Tests:  Recent Labs Lab 05/29/15 0255 06/02/15 1140 06/03/15 0450  AST 952* 149* 53*  ALT 571* 230* 150*  ALKPHOS 283* 429* 309*  BILITOT 1.4* 2.6* 1.2  PROT 6.8 7.2 5.9*  ALBUMIN 3.7 3.6 2.8*    Recent  Labs Lab 05/29/15 0255 06/02/15 1140  LIPASE 55* 32   No results for input(s): AMMONIA in the last 168 hours. CBC:  Recent Labs Lab 05/29/15 0255 06/02/15 1140 06/03/15 0450  WBC 4.6 7.5 5.2  NEUTROABS 3.6 6.3  --   HGB 11.4* 11.2* 9.7*  HCT 34.5* 34.0* 30.6*  MCV 87.1 87.2 87.7  PLT 175 199 171   Cardiac Enzymes: No results for input(s): CKTOTAL, CKMB, CKMBINDEX, TROPONINI in the last 168 hours. BNP (last 3 results) No results for input(s): BNP in the last 8760 hours.  ProBNP (last 3 results) No results for input(s): PROBNP in the last 8760 hours.  CBG:  Recent Labs Lab 06/02/15 1753 06/02/15 2050 06/03/15 0732 06/03/15 1140  GLUCAP 81 120* 82 184*    Recent Results (from the past 240 hour(s))  Surgical pcr screen     Status: None   Collection Time: 05/26/15  7:45 AM  Result Value Ref Range Status   MRSA, PCR NEGATIVE NEGATIVE Final   Staphylococcus aureus NEGATIVE NEGATIVE Final    Comment:        The Xpert SA Assay (FDA approved for NASAL specimens in patients over 69 years of age), is one component of a comprehensive surveillance program.  Test performance has been validated by [facility] for patients greater than or equal to 69 year old. It is not intended to diagnose infection nor  to guide or monitor treatment.   C difficile quick scan w PCR reflex     Status: None   Collection Time: 06/03/15 11:05 AM  Result Value Ref Range Status   C Diff antigen NEGATIVE NEGATIVE Final   C Diff toxin NEGATIVE NEGATIVE Final   C Diff interpretation Negative for toxigenic C. difficile  Final     Studies: Dg Chest 2 View  06/03/2015   CLINICAL DATA:  Right-sided chest pain for 1 week  EXAM: CHEST  2 VIEW  COMPARISON:  05/29/2015  FINDINGS: Normal cardiac silhouette. Small hiatal hernia noted. Small focus of airspace disease in the RIGHT lower lobe. No consolidation. No pneumothorax.  IMPRESSION: Small focus of airspace disease in the RIGHT lower lobe suggests  pneumonia or aspiration pneumonitis. This is a subtle finding.  Small hiatal hernia.   Electronically Signed   By: Suzy Bouchard M.D.   On: 06/03/2015 11:16   Ct Abdomen Pelvis W Contrast  06/02/2015   CLINICAL DATA:  Recent ERCP and stent placement. Patient complains of back and abdominal pain. History of choledocholithiasis. Elevated alkaline phosphatase and total bilirubin levels.  EXAM: CT ABDOMEN AND PELVIS WITH CONTRAST  TECHNIQUE: Multidetector CT imaging of the abdomen and pelvis was performed using the standard protocol following bolus administration of intravenous contrast.  CONTRAST:  59mL OMNIPAQUE IOHEXOL 300 MG/ML  SOLN  COMPARISON:  MR 05/17/2015  FINDINGS: Mild volume loss at the lung bases. No pleural effusions. No evidence for free intra-abdominal air.  There is extensive pneumobilia. The extrahepatic bile duct is dilated measuring up to 9 mm on sequence 2, image 28. Extrahepatic biliary dilatation has not significantly changed from the prior MRI. There is a non metallic stent in the common bile duct that extends into the duodenum. There continues to be moderate intrahepatic biliary dilatation. Central left hepatic duct measures up to 1.4 cm and previously measured 1.0 cm. Gallbladder has been removed. No focal liver lesions and the portal venous system is patent.  Normal appearance of the spleen. No pancreatic duct dilatation. Normal appearance of the bilateral adrenal glands. Normal appearance of both kidneys. Subtle cystic structure in the left kidney interpolar region is better characterized on the prior MRI. Distention of the urinary bladder.  Again noted is a moderate-sized hiatal hernia. No acute abnormality involving the stomach or duodenum.  No significant free fluid or lymphadenopathy. Uterus is absent. Diverticulosis in the left colon without acute inflammatory changes involving the colon. No acute abnormalities involving the small bowel.  No acute bone abnormality.  Periumbilical  hernia containing fat.  IMPRESSION: Interval placement of a common bile duct stent. There is extensive pneumobilia related to the biliary stent. There may be slightly increased intrahepatic biliary dilatation despite placement of the stent.  Extensive diverticulosis in left colon without acute colonic inflammation.   Electronically Signed   By: Markus Daft M.D.   On: 06/02/2015 14:00    Scheduled Meds: . enoxaparin (LOVENOX) injection  40 mg Subcutaneous Q24H  . insulin aspart  0-9 Units Subcutaneous TID WC  . Liraglutide  1.2 mg Injection QHS  . pantoprazole  40 mg Oral q morning - 10a  . venlafaxine XR  75 mg Oral q morning - 10a   Continuous Infusions: . sodium chloride 125 mL/hr at 06/03/15 1040    Active Problems:   Abdominal pain   Nausea & vomiting    Time spent: 25 minutes.     [facility] Pager [phone] If 7PM-7AM,  please contact night-coverage at [email or web address], password [facility] 06/03/2015, 3:55 PM  LOS: 1 day

## 2015-06-03 NOTE — Progress Notes (Signed)
ANTIBIOTIC CONSULT NOTE - INITIAL  Pharmacy Consult for Primaxin Indication: Aspiration pneumonia  Allergies  Allergen Reactions  . Codeine Shortness Of Breath  . Hydrocodone Itching  . Tetracyclines & Related Itching  . Azithromycin Itching and Rash  . Erythromycin Itching and Rash  . Morphine And Related Itching and Rash  . Penicillins Itching and Rash  . Septra [Sulfamethoxazole-Trimethoprim] Itching and Rash    Patient Measurements: Height: 5\' 3"  (160 cm) Weight: 170 lb 8 oz (77.338 kg) IBW/kg (Calculated) : 52.4  Vital Signs: Temp: 97.8 F (36.6 C) (09/03 1400) Temp Source: Oral (09/03 1400) BP: 116/57 mmHg (09/03 1400) Pulse Rate: 60 (09/03 1400) Intake/Output from previous day: 09/02 0701 - 09/03 0700 In: 1900 [P.O.:800; I.V.:100; IV Piggyback:1000] Out: 1850 [Urine:1850] Intake/Output from this shift: Total I/O In: 960 [P.O.:960] Out: 1550 [Urine:1550]  Labs:  Recent Labs  06/02/15 1140 06/03/15 0450  WBC 7.5 5.2  HGB 11.2* 9.7*  PLT 199 171  CREATININE 1.32* 1.21*   Estimated Creatinine Clearance: 43.8 mL/min (by C-G formula based on Cr of 1.21). No results for input(s): VANCOTROUGH, VANCOPEAK, VANCORANDOM, GENTTROUGH, GENTPEAK, GENTRANDOM, TOBRATROUGH, TOBRAPEAK, TOBRARND, AMIKACINPEAK, AMIKACINTROU, AMIKACIN in the last 72 hours.   Microbiology: Recent Results (from the past 720 hour(s))  Surgical pcr screen     Status: None   Collection Time: 05/26/15  7:45 AM  Result Value Ref Range Status   MRSA, PCR NEGATIVE NEGATIVE Final   Staphylococcus aureus NEGATIVE NEGATIVE Final    Comment:        The Xpert SA Assay (FDA approved for NASAL specimens in patients over 69 years of age), is one component of a comprehensive surveillance program.  Test performance has been validated by [facility] for patients greater than or equal to 69 year old year old. It is not intended to diagnose infection nor to guide or monitor treatment.   C difficile quick scan  w PCR reflex     Status: None   Collection Time: 06/03/15 11:05 AM  Result Value Ref Range Status   C Diff antigen NEGATIVE NEGATIVE Final   C Diff toxin NEGATIVE NEGATIVE Final   C Diff interpretation Negative for toxigenic C. difficile  Final    Medical History: Past Medical History  Diagnosis Date  . HYPERTENSION, UNSPECIFIED   . HYPERLIPIDEMIA-MIXED   . OBESITY   . GERD   . CATARACT, RIGHT EYE   . Depression   . Anemia   . PONV (postoperative nausea and vomiting)     past history only  . DM      Assessment: 5 y/oF with PMH of HTN, HLD, GERD, depression, DM, recent ERCP, stone extraction, and stent placement for CBD stricture who [facility] ED on 9/2 with persistent n/v, RUQ abdominal pain, and diarrhea. Patient found to be dehydrated and with LFTs that are elevated but improved since recent hospital stay. CXR today suggestive of aspiration pneumonia, and pharmacy consulted to assist with dosing of Primaxin.  9/3 >> Primaxin >>    Tmax: 99.58F WBC WNL SCr 1.21 with CrCl 50 ml/min (normalized)  Goal of Therapy:  Appropriate antibiotic dosing for renal function and indication Eradication of infection  Plan:   Primaxin 500mg  IV q8h.  Monitor renal function, cultures results as available, clinical course.   Lindell Spar, PharmD, BCPS Pager: [phone] 06/03/2015 7:03 PM

## 2015-06-04 DIAGNOSIS — R1011 Right upper quadrant pain: Secondary | ICD-10-CM

## 2015-06-04 LAB — GLUCOSE, CAPILLARY
GLUCOSE-CAPILLARY: 139 mg/dL — AB (ref 65–99)
GLUCOSE-CAPILLARY: 149 mg/dL — AB (ref 65–99)
GLUCOSE-CAPILLARY: 118 mg/dL — AB (ref 65–99)
GLUCOSE-CAPILLARY: 135 mg/dL — AB (ref 65–99)

## 2015-06-04 NOTE — Progress Notes (Signed)
[facility] PROGRESS NOTE  Tina Briggs EYC:[id number] DOB: Sep 28, 1946 DOA: 06/02/2015 PCP: Wenda Low, MD  Assessment/Plan: 1. Choledocholithiasis, s/p ERCP with sphincterotomy and stone extraction and stent placement for biliary stricture; - symptoms of nausea, vomiting and abdominal pain improved.  - advance diet as tolerated.  - Gi consulted and recommendations given.    Hypertension well controlled.   Diabetes Mellitus: CBG (last 3)   Recent Labs  06/03/15 2116 06/04/15 0754 06/04/15 1151  GLUCAP 92 139* 149*    Resume SSI.    DEHYDRATION; improved with hydration.   Subtle episode of aspiration pneumonitis: Fever of 101 on 9/3 , started on primaxin for aspiration pneumonia.  Good oxygen sats.  Monitor.   Code Status: full code.  Family Communication: none at bedside Disposition Plan: pending.    Consultants:  Gastroenterology.   Procedures:  none  Antibiotics:  none  HPI/Subjective: Nausea improved, no vomiting,  RUQ PAIN. improved  Objective: Filed Vitals:   06/04/15 0510  BP: 104/52  Pulse: 69  Temp: 98 F (36.7 C)  Resp: 20    Intake/Output Summary (Last 24 hours) at 06/04/15 1349 Last data filed at 06/04/15 0900  Gross per 24 hour  Intake    240 ml  Output   1300 ml  Net  -1060 ml   Filed Weights   06/02/15 1032 06/02/15 1721  Weight: 75.297 kg (166 lb) 77.338 kg (170 lb 8 oz)    Exam:   General:  Alert afebrile comfortable  Cardiovascular: s1s2  Respiratory: ctab  Abdomen: soft mildly tender RUQ , not distended   Musculoskeletal: no pedal edema.   Data Reviewed: Basic Metabolic Panel:  Recent Labs Lab 05/29/15 0255 06/02/15 1140 06/03/15 0450  NA 139 139 139  K 3.5 4.0 3.5  CL 105 105 109  CO2 23 24 25   GLUCOSE 263* 178* 87  BUN 20 14 10   CREATININE 1.32* 1.32* 1.21*  CALCIUM 8.9 9.0 8.2*   Liver Function Tests:  Recent Labs Lab 05/29/15 0255 06/02/15 1140 06/03/15 0450  AST 952*  149* 53*  ALT 571* 230* 150*  ALKPHOS 283* 429* 309*  BILITOT 1.4* 2.6* 1.2  PROT 6.8 7.2 5.9*  ALBUMIN 3.7 3.6 2.8*    Recent Labs Lab 05/29/15 0255 06/02/15 1140  LIPASE 55* 32   No results for input(s): AMMONIA in the last 168 hours. CBC:  Recent Labs Lab 05/29/15 0255 06/02/15 1140 06/03/15 0450  WBC 4.6 7.5 5.2  NEUTROABS 3.6 6.3  --   HGB 11.4* 11.2* 9.7*  HCT 34.5* 34.0* 30.6*  MCV 87.1 87.2 87.7  PLT 175 199 171   Cardiac Enzymes: No results for input(s): CKTOTAL, CKMB, CKMBINDEX, TROPONINI in the last 168 hours. BNP (last 3 results) No results for input(s): BNP in the last 8760 hours.  ProBNP (last 3 results) No results for input(s): PROBNP in the last 8760 hours.  CBG:  Recent Labs Lab 06/03/15 1140 06/03/15 1701 06/03/15 2116 06/04/15 0754 06/04/15 1151  GLUCAP 184* 84 92 139* 149*    Recent Results (from the past 240 hour(s))  Surgical pcr screen     Status: None   Collection Time: 05/26/15  7:45 AM  Result Value Ref Range Status   MRSA, PCR NEGATIVE NEGATIVE Final   Staphylococcus aureus NEGATIVE NEGATIVE Final    Comment:        The Xpert SA Assay (FDA approved for NASAL specimens in patients over 63 years of age), is one component of a comprehensive  surveillance program.  Test performance has been validated by [facility] for patients greater than or equal to 56 year old. It is not intended to diagnose infection nor to guide or monitor treatment.   C difficile quick scan w PCR reflex     Status: None   Collection Time: 06/03/15 11:05 AM  Result Value Ref Range Status   C Diff antigen NEGATIVE NEGATIVE Final   C Diff toxin NEGATIVE NEGATIVE Final   C Diff interpretation Negative for toxigenic C. difficile  Final     Studies: Dg Chest 2 View  06/03/2015   CLINICAL DATA:  Right-sided chest pain for 1 week  EXAM: CHEST  2 VIEW  COMPARISON:  05/29/2015  FINDINGS: Normal cardiac silhouette. Small hiatal hernia noted. Small focus of  airspace disease in the RIGHT lower lobe. No consolidation. No pneumothorax.  IMPRESSION: Small focus of airspace disease in the RIGHT lower lobe suggests pneumonia or aspiration pneumonitis. This is a subtle finding.  Small hiatal hernia.   Electronically Signed   By: Suzy Bouchard M.D.   On: 06/03/2015 11:16    Scheduled Meds: . enoxaparin (LOVENOX) injection  40 mg Subcutaneous Q24H  . imipenem-cilastatin  500 mg Intravenous 3 times per day  . insulin aspart  0-9 Units Subcutaneous TID WC  . Liraglutide  1.2 mg Injection QHS  . pantoprazole  40 mg Oral q morning - 10a  . venlafaxine XR  75 mg Oral q morning - 10a   Continuous Infusions: . sodium chloride 1,000 mL (06/04/15 1132)    Active Problems:   Abdominal pain   Nausea & vomiting    Time spent: 25 minutes.     [facility]  [facility] Pager [phone] If 7PM-7AM, please contact night-coverage at [email or web address], password [facility] 06/04/2015, 1:49 PM  LOS: 2 days

## 2015-06-04 NOTE — Progress Notes (Signed)
Pt had an episode of 100 cc of vomitting.

## 2015-06-04 NOTE — Progress Notes (Signed)
Tina Briggs 10:46 AM  Subjective: Patient vomited a few hours after dinner last night but feels good this morning and is about to eat breakfast and has no new complaints  Objective: Vital signs stable afebrile no acute distress abdomen is soft nontender good bowel sounds much improved from yesterday no new labs  Assessment: Probable residual bile duct issues  Plan: I have set her up for repeat ERCP Tuesday at 12:30 with anesthesia assistance and if she tolerates food she could go home and we could proceed as an outpatient but if not we can keep her here as needed and I discussed this with her and her husband and she knows to return at 10:30 and be nothing by mouth after midnight  [facility] E  Pager [phone] After 5PM or if no answer call [phone]

## 2015-06-04 NOTE — Progress Notes (Signed)
Spoke with Corene Cornea pharmacist tonight re: patient's Liraglutide medication. This drug has to be supplied by patient. Patient however stated she has stopped taking the medication due to her poor oral intake and will resume medication when her appetite improves. Patient will not supply medication at this time and will continue taking her oral DM medications as prescribed.

## 2015-06-04 NOTE — Progress Notes (Signed)
Pt ambulated in the hall with husband x2,tolerated well.

## 2015-06-05 LAB — BASIC METABOLIC PANEL
Anion gap: 4 — ABNORMAL LOW (ref 5–15)
BUN: 11 mg/dL (ref 6–20)
CALCIUM: 8.1 mg/dL — AB (ref 8.9–10.3)
CO2: 22 mmol/L (ref 22–32)
CREATININE: 1.17 mg/dL — AB (ref 0.44–1.00)
Chloride: 115 mmol/L — ABNORMAL HIGH (ref 101–111)
GFR calc non Af Amer: 47 mL/min — ABNORMAL LOW (ref >60)
GFR, EST AFRICAN AMERICAN: 54 mL/min — AB (ref >60)
Glucose, Bld: 133 mg/dL — ABNORMAL HIGH (ref 65–99)
Potassium: 4 mmol/L (ref 3.5–5.1)
SODIUM: 141 mmol/L (ref 135–145)

## 2015-06-05 LAB — GLUCOSE, CAPILLARY
GLUCOSE-CAPILLARY: 83 mg/dL (ref 65–99)
GLUCOSE-CAPILLARY: 124 mg/dL — AB (ref 65–99)
GLUCOSE-CAPILLARY: 136 mg/dL — AB (ref 65–99)
Glucose-Capillary: 184 mg/dL — ABNORMAL HIGH (ref 65–99)

## 2015-06-05 NOTE — Progress Notes (Signed)
Tina Briggs 9:40 AM  Subjective: Patient doing well without any abdominal pain but not eating too much but no more vomiting and no cough or shortness of breath and no new complaints  Objective: Vital signs stable afebrile no acute distress abdomen is soft nontender lungs are clear heart regular rate and rhythm no new liver tests or CBC  Assessment: Biliary stricture questionably residual CBD stones  Plan: Okay with me to proceed with ERCP tomorrow as scheduled and we rediscussed the procedure  [facility] E  Pager [phone] After 5PM or if no answer call [phone]

## 2015-06-05 NOTE — Progress Notes (Signed)
[facility] PROGRESS NOTE  Tina Briggs HMC:[id number] DOB: [birth year] DOA: 06/02/2015 PCP: Wenda Low, MD  Assessment/Plan: 1. Choledocholithiasis, s/p ERCP with sphincterotomy and stone extraction and stent placement for biliary stricture; - symptoms of nausea, vomiting and abdominal pain improved.  - advance diet as tolerated.  - Gi consulted and recommendations given.  - plan for ERCP in am for residual stones.    Hypertension well controlled.   Diabetes Mellitus: CBG (last 3)   Recent Labs  06/05/15 0758 06/05/15 1329 06/05/15 1638  GLUCAP 83 184* 124*    Resume SSI.    DEHYDRATION; improved with hydration.   Subtle episode of aspiration pneumonitis: Fever of 101 on 9/3 , started on primaxin for aspiration pneumonia.  Good oxygen sats.  Monitor.  Cough improving.   Code Status: full code.  Family Communication: none at bedside Disposition Plan: pending.    Consultants:  Gastroenterology.   Procedures:  none  Antibiotics:  none  HPI/Subjective: Nausea improved, no vomiting,  RUQ PAIN. Improved. No cough since two days.   Objective: Filed Vitals:   06/05/15 1414  BP: 141/68  Pulse: 58  Temp: 98.1 F (36.7 C)  Resp: 20    Intake/Output Summary (Last 24 hours) at 06/05/15 1706 Last data filed at 06/05/15 1114  Gross per 24 hour  Intake   6101 ml  Output   2200 ml  Net   3901 ml   Filed Weights   06/02/15 1032 06/02/15 1721  Weight: 75.297 kg (166 lb) 77.338 kg (170 lb 8 oz)    Exam:   General:  Alert afebrile comfortable  Cardiovascular: s1s2  Respiratory: ctab  Abdomen: soft mildly tender RUQ , not distended   Musculoskeletal: no pedal edema.   Data Reviewed: Basic Metabolic Panel:  Recent Labs Lab 06/02/15 1140 06/03/15 0450 06/05/15 0404  NA 139 139 141  K 4.0 3.5 4.0  CL 105 109 115*  CO2 24 25 22   GLUCOSE 178* 87 133*  BUN 14 10 11   CREATININE 1.32* 1.21* 1.17*  CALCIUM 9.0 8.2* 8.1*    Liver Function Tests:  Recent Labs Lab 06/02/15 1140 06/03/15 0450  AST 149* 53*  ALT 230* 150*  ALKPHOS 429* 309*  BILITOT 2.6* 1.2  PROT 7.2 5.9*  ALBUMIN 3.6 2.8*    Recent Labs Lab 06/02/15 1140  LIPASE 32   No results for input(s): AMMONIA in the last 168 hours. CBC:  Recent Labs Lab 06/02/15 1140 06/03/15 0450  WBC 7.5 5.2  NEUTROABS 6.3  --   HGB 11.2* 9.7*  HCT 34.0* 30.6*  MCV 87.2 87.7  PLT 199 171   Cardiac Enzymes: No results for input(s): CKTOTAL, CKMB, CKMBINDEX, TROPONINI in the last 168 hours. BNP (last 3 results) No results for input(s): BNP in the last 8760 hours.  ProBNP (last 3 results) No results for input(s): PROBNP in the last 8760 hours.  CBG:  Recent Labs Lab 06/04/15 1705 06/04/15 2051 06/05/15 0758 06/05/15 1329 06/05/15 1638  GLUCAP 118* 135* 83 184* 124*    Recent Results (from the past 240 hour(s))  C difficile quick scan w PCR reflex     Status: None   Collection Time: 06/03/15 11:05 AM  Result Value Ref Range Status   C Diff antigen NEGATIVE NEGATIVE Final   C Diff toxin NEGATIVE NEGATIVE Final   C Diff interpretation Negative for toxigenic C. difficile  Final     Studies: No results found.  Scheduled Meds: . enoxaparin (LOVENOX) injection  40 mg Subcutaneous Q24H  . imipenem-cilastatin  500 mg Intravenous 3 times per day  . insulin aspart  0-9 Units Subcutaneous TID WC  . Liraglutide  1.2 mg Injection QHS  . pantoprazole  40 mg Oral q morning - 10a  . venlafaxine XR  75 mg Oral q morning - 10a   Continuous Infusions: . sodium chloride 125 mL/hr at 06/05/15 0411    Active Problems:   Abdominal pain   Nausea & vomiting    Time spent: 25 minutes.     [facility]  [facility] Pager [phone] If 7PM-7AM, please contact night-coverage at [email or web address], password [facility] 06/05/2015, 5:06 PM  LOS: 3 days

## 2015-06-06 ENCOUNTER — Inpatient Hospital Stay (HOSPITAL_COMMUNITY): Payer: Medicare Other

## 2015-06-06 ENCOUNTER — Encounter (HOSPITAL_COMMUNITY)
Admission: EM | Disposition: A | Discharge status: Home / Self Care | Payer: Self-pay | Source: Home / Self Care | Attending: Internal Medicine

## 2015-06-06 ENCOUNTER — Inpatient Hospital Stay (HOSPITAL_COMMUNITY): Payer: Medicare Other | Admitting: Anesthesiology

## 2015-06-06 ENCOUNTER — Encounter (HOSPITAL_COMMUNITY): Payer: Self-pay | Admitting: Anesthesiology

## 2015-06-06 DIAGNOSIS — D649 Anemia, unspecified: Secondary | ICD-10-CM | POA: Diagnosis present

## 2015-06-06 DIAGNOSIS — J690 Pneumonitis due to inhalation of food and vomit: Secondary | ICD-10-CM | POA: Diagnosis present

## 2015-06-06 HISTORY — PX: ERCP: SHX5425

## 2015-06-06 LAB — CBC WITH DIFFERENTIAL/PLATELET
Basophils Absolute: 0 10*3/uL (ref 0.0–0.1)
Basophils Relative: 0 % (ref 0–1)
EOS PCT: 2 % (ref 0–5)
Eosinophils Absolute: 0.1 10*3/uL (ref 0.0–0.7)
HCT: 28.7 % — ABNORMAL LOW (ref 36.0–46.0)
Hemoglobin: 9.1 g/dL — ABNORMAL LOW (ref 12.0–15.0)
LYMPHS ABS: 1.9 10*3/uL (ref 0.7–4.0)
LYMPHS PCT: 33 % (ref 12–46)
MCH: 27.7 pg (ref 26.0–34.0)
MCHC: 31.7 g/dL (ref 30.0–36.0)
MCV: 87.2 fL (ref 78.0–100.0)
MONO ABS: 0.4 10*3/uL (ref 0.1–1.0)
Monocytes Relative: 7 % (ref 3–12)
Neutro Abs: 3.3 10*3/uL (ref 1.7–7.7)
Neutrophils Relative %: 58 % (ref 43–77)
PLATELETS: 204 10*3/uL (ref 150–400)
RBC: 3.29 MIL/uL — AB (ref 3.87–5.11)
RDW: 14.8 % (ref 11.5–15.5)
WBC: 5.7 10*3/uL (ref 4.0–10.5)

## 2015-06-06 LAB — GLUCOSE, CAPILLARY
GLUCOSE-CAPILLARY: 94 mg/dL (ref 65–99)
GLUCOSE-CAPILLARY: 90 mg/dL (ref 65–99)
Glucose-Capillary: 86 mg/dL (ref 65–99)
Glucose-Capillary: 133 mg/dL — ABNORMAL HIGH (ref 65–99)
Glucose-Capillary: 133 mg/dL — ABNORMAL HIGH (ref 65–99)

## 2015-06-06 LAB — COMPREHENSIVE METABOLIC PANEL
ALT: 117 U/L — AB (ref 14–54)
AST: 143 U/L — ABNORMAL HIGH (ref 15–41)
Albumin: 2.6 g/dL — ABNORMAL LOW (ref 3.5–5.0)
Alkaline Phosphatase: 312 U/L — ABNORMAL HIGH (ref 38–126)
Anion gap: 7 (ref 5–15)
BUN: 10 mg/dL (ref 6–20)
CHLORIDE: 114 mmol/L — AB (ref 101–111)
CO2: 21 mmol/L — ABNORMAL LOW (ref 22–32)
CREATININE: 1.06 mg/dL — AB (ref 0.44–1.00)
Calcium: 8.1 mg/dL — ABNORMAL LOW (ref 8.9–10.3)
GFR, EST NON AFRICAN AMERICAN: 53 mL/min — AB (ref >60)
Glucose, Bld: 114 mg/dL — ABNORMAL HIGH (ref 65–99)
Potassium: 3.7 mmol/L (ref 3.5–5.1)
Sodium: 142 mmol/L (ref 135–145)
TOTAL PROTEIN: 5.5 g/dL — AB (ref 6.5–8.1)
Total Bilirubin: 1.3 mg/dL — ABNORMAL HIGH (ref 0.3–1.2)

## 2015-06-06 SURGERY — ERCP, WITH INTERVENTION IF INDICATED
Anesthesia: General

## 2015-06-06 MED ORDER — LIDOCAINE HCL (CARDIAC) 20 MG/ML IV SOLN
INTRAVENOUS | Status: DC | PRN
  Administered 2015-06-06: 50 mg via INTRAVENOUS

## 2015-06-06 MED ORDER — GLUCAGON HCL RDNA (DIAGNOSTIC) 1 MG IJ SOLR
INTRAMUSCULAR | Status: AC
  Filled 2015-06-06: qty 1

## 2015-06-06 MED ORDER — SODIUM CHLORIDE 0.9 % IJ SOLN
INTRAMUSCULAR | Status: AC
  Filled 2015-06-06: qty 10

## 2015-06-06 MED ORDER — EPHEDRINE SULFATE 50 MG/ML IJ SOLN
INTRAMUSCULAR | Status: AC
  Filled 2015-06-06: qty 1

## 2015-06-06 MED ORDER — ONDANSETRON HCL 4 MG/2ML IJ SOLN
INTRAMUSCULAR | Status: AC
  Filled 2015-06-06: qty 2

## 2015-06-06 MED ORDER — SUCCINYLCHOLINE CHLORIDE 20 MG/ML IJ SOLN
INTRAMUSCULAR | Status: DC | PRN
  Administered 2015-06-06: 100 mg via INTRAVENOUS

## 2015-06-06 MED ORDER — FENTANYL CITRATE (PF) 100 MCG/2ML IJ SOLN
INTRAMUSCULAR | Status: DC | PRN
Start: 2015-06-06 — End: 2015-06-06
  Administered 2015-06-06 (×2): 50 ug via INTRAVENOUS

## 2015-06-06 MED ORDER — PROPOFOL 10 MG/ML IV BOLUS
INTRAVENOUS | Status: DC | PRN
  Administered 2015-06-06: 40 mg via INTRAVENOUS
  Administered 2015-06-06: 160 mg via INTRAVENOUS

## 2015-06-06 MED ORDER — ESMOLOL HCL 10 MG/ML IV SOLN
INTRAVENOUS | Status: DC | PRN
  Administered 2015-06-06: 20 mg via INTRAVENOUS

## 2015-06-06 MED ORDER — FENTANYL CITRATE (PF) 100 MCG/2ML IJ SOLN
INTRAMUSCULAR | Status: AC
  Filled 2015-06-06: qty 4

## 2015-06-06 MED ORDER — EPHEDRINE SULFATE 50 MG/ML IJ SOLN
INTRAMUSCULAR | Status: DC | PRN
  Administered 2015-06-06: 5 mg via INTRAVENOUS

## 2015-06-06 MED ORDER — LACTATED RINGERS IV SOLN
INTRAVENOUS | Status: DC
  Administered 2015-06-06: 12:00:00 via INTRAVENOUS
  Administered 2015-06-06: 1000 mL via INTRAVENOUS

## 2015-06-06 MED ORDER — PROPOFOL 10 MG/ML IV BOLUS
INTRAVENOUS | Status: AC
  Filled 2015-06-06: qty 20

## 2015-06-06 MED ORDER — LIDOCAINE HCL (CARDIAC) 20 MG/ML IV SOLN
INTRAVENOUS | Status: AC
Start: 2015-06-06 — End: 2015-06-06
  Filled 2015-06-06: qty 5

## 2015-06-06 MED ORDER — SODIUM CHLORIDE 0.9 % IV SOLN: INTRAVENOUS | Status: DC

## 2015-06-06 MED ORDER — SODIUM CHLORIDE 0.9 % IV SOLN
INTRAVENOUS | Status: DC | PRN
  Administered 2015-06-06: 30 mL

## 2015-06-06 NOTE — Anesthesia Postprocedure Evaluation (Signed)
Anesthesia Post Note  Patient: Tina Briggs  Procedure(s) Performed: Procedure(s) (LRB): ENDOSCOPIC RETROGRADE CHOLANGIOPANCREATOGRAPHY (ERCP) (N/A)  Anesthesia type: general  Patient location: PACU  Post pain: Pain level controlled  Post assessment: Patient's Cardiovascular Status Stable  Last Vitals:  Filed Vitals:   06/06/15 1500  BP: 177/68  Pulse: 55  Temp:   Resp: 20    Post vital signs: Reviewed and stable  Level of consciousness: sedated  Complications: No apparent anesthesia complications

## 2015-06-06 NOTE — Transfer of Care (Signed)
Immediate Anesthesia Transfer of Care Note  Patient: Tina Briggs  Procedure(s) Performed: Procedure(s): ENDOSCOPIC RETROGRADE CHOLANGIOPANCREATOGRAPHY (ERCP) (N/A)  Patient Location: PACU  Anesthesia Type:General  Level of Consciousness:  sedated, patient cooperative and responds to stimulation  Airway & Oxygen Therapy:Patient Spontanous Breathing and Patient connected to face mask oxgen  Post-op Assessment:  Report given to PACU RN and Post -op Vital signs reviewed and stable  Post vital signs:  Reviewed and stable  Last Vitals:  Filed Vitals:   06/06/15 1422  BP: 138/92  Pulse: 68  Temp: 36.8 C  Resp: 24    Complications: No apparent anesthesia complications

## 2015-06-06 NOTE — Anesthesia Procedure Notes (Signed)
Procedure Name: Intubation Date/Time: 06/06/2015 12:59 PM Performed by: Glory Buff Pre-anesthesia Checklist: Patient identified, Emergency Drugs available, Suction available and Patient being monitored Patient Re-evaluated:Patient Re-evaluated prior to inductionOxygen Delivery Method: Circle System Utilized Preoxygenation: Pre-oxygenation with 100% oxygen Intubation Type: IV induction Ventilation: Mask ventilation without difficulty Laryngoscope Size: Miller and 3 Grade View: Grade I Tube type: Oral Tube size: 7.0 mm Number of attempts: 1 Airway Equipment and Method: Stylet and Oral airway Placement Confirmation: ETT inserted through vocal cords under direct vision,  positive ETCO2 and breath sounds checked- equal and bilateral Secured at: 20 cm Tube secured with: Tape Dental Injury: Teeth and Oropharynx as per pre-operative assessment

## 2015-06-06 NOTE — Anesthesia Preprocedure Evaluation (Signed)
Anesthesia Evaluation  Patient identified by MRN, date of birth, ID band Patient awake    Reviewed: Allergy & Precautions, NPO status , Patient's Chart, lab work & pertinent test results  Airway Mallampati: II  TM Distance: >3 FB Neck ROM: Full    Dental   Pulmonary    Pulmonary exam normal       Cardiovascular hypertension, Pt. on medications Normal cardiovascular exam    Neuro/Psych Depression    GI/Hepatic GERD-  Medicated and Controlled,  Endo/Other  diabetes, Type 2, Oral Hypoglycemic Agents  Renal/GU      Musculoskeletal   Abdominal   Peds  Hematology   Anesthesia Other Findings   Reproductive/Obstetrics                             Anesthesia Physical Anesthesia Plan  ASA: II  Anesthesia Plan: General   Post-op Pain Management:    Induction: Intravenous  Airway Management Planned: Oral ETT  Additional Equipment:   Intra-op Plan:   Post-operative Plan: Extubation in OR  Informed Consent: I have reviewed the patients History and Physical, chart, labs and discussed the procedure including the risks, benefits and alternatives for the proposed anesthesia with the patient or authorized representative who has indicated his/her understanding and acceptance.     Plan Discussed with: CRNA and Surgeon  Anesthesia Plan Comments:         Anesthesia Quick Evaluation

## 2015-06-06 NOTE — Progress Notes (Signed)
ANTIBIOTIC CONSULT NOTE - follow up  Pharmacy Consult for Primaxin Indication: Aspiration pneumonia  Allergies  Allergen Reactions  . Codeine Shortness Of Breath  . Hydrocodone Itching  . Tetracyclines & Related Itching  . Azithromycin Itching and Rash  . Erythromycin Itching and Rash  . Morphine And Related Itching and Rash  . Penicillins Itching and Rash  . Septra [Sulfamethoxazole-Trimethoprim] Itching and Rash    Patient Measurements: Height: 5\' 3"  (160 cm) Weight: 170 lb 8 oz (77.338 kg) IBW/kg (Calculated) : 52.4  Vital Signs: Temp: 98.6 F (37 C) (09/06 0527) Temp Source: Oral (09/06 0527) BP: 121/79 mmHg (09/06 0527) Pulse Rate: 61 (09/05 2130) Intake/Output from previous day: 09/05 0701 - 09/06 0700 In: 1878.3 [P.O.:597; I.V.:981.3; IV Piggyback:300] Out: 800 [Urine:800] Intake/Output from this shift:    Labs:  Recent Labs  06/05/15 0404 06/06/15 0440  WBC  --  5.7  HGB  --  9.1*  PLT  --  204  CREATININE 1.17* 1.06*   Estimated Creatinine Clearance: 50 mL/min (by C-G formula based on Cr of 1.06). No results for input(s): VANCOTROUGH, VANCOPEAK, VANCORANDOM, GENTTROUGH, GENTPEAK, GENTRANDOM, TOBRATROUGH, TOBRAPEAK, TOBRARND, AMIKACINPEAK, AMIKACINTROU, AMIKACIN in the last 72 hours.   Microbiology: Recent Results (from the past 720 hour(s))  Surgical pcr screen     Status: None   Collection Time: 05/26/15  7:45 AM  Result Value Ref Range Status   MRSA, PCR NEGATIVE NEGATIVE Final   Staphylococcus aureus NEGATIVE NEGATIVE Final    Comment:        The Xpert SA Assay (FDA approved for NASAL specimens in patients over 24 years of age), is one component of a comprehensive surveillance program.  Test performance has been validated by [facility] for patients greater than or equal to 68 year old. It is not intended to diagnose infection nor to guide or monitor treatment.   C difficile quick scan w PCR reflex     Status: None   Collection Time:  06/03/15 11:05 AM  Result Value Ref Range Status   C Diff antigen NEGATIVE NEGATIVE Final   C Diff toxin NEGATIVE NEGATIVE Final   C Diff interpretation Negative for toxigenic C. difficile  Final    Medical History: Past Medical History  Diagnosis Date  . HYPERTENSION, UNSPECIFIED   . HYPERLIPIDEMIA-MIXED   . OBESITY   . GERD   . CATARACT, RIGHT EYE   . Depression   . Anemia   . PONV (postoperative nausea and vomiting)     past history only  . DM      Assessment: 42 y/oF with PMH of HTN, HLD, GERD, depression, DM, recent ERCP, stone extraction, and stent placement for CBD stricture who presented to [facility] ED on 9/2 with persistent n/v, RUQ abdominal pain, and diarrhea. Patient found to be dehydrated and with LFTs that are elevated but improved since recent hospital stay. CXR today suggestive of aspiration pneumonia, and pharmacy consulted to assist with dosing of Primaxin.  9/3 >> Primaxin >>    Tmax: AF WBC WNL SCr 1.06 with CrCl 50 ml/min (normalized)  Goal of Therapy:  Appropriate antibiotic dosing for renal function and indication Eradication of infection  Plan:   Continue Primaxin 500mg  IV q8h.  Monitor renal function, cultures results as available, clinical course.   Dolly Rias RPh 06/06/2015, 8:01 AM Pager [phone]

## 2015-06-06 NOTE — Progress Notes (Signed)
[name] 12:47 PM  Subjective: Patient with no new symptoms and the procedure was rediscussed with her and her husband and she has no new complaints or questions  Objective: Vital signs stable afebrile no acute distress exam please see preassessment evaluation slight increase midepigastric discomfort from yesterday otherwise soft nontender labs about the same  Assessment: Questionable residual CBD stone  Plan: Okay to proceed with ERCP with anesthesia assistance  [facility] E  Pager [phone] After 5PM or if no answer call [phone]

## 2015-06-06 NOTE — Progress Notes (Signed)
[facility] PROGRESS NOTE  [name] XIP:[id number] DOB: [birth year] DOA: 06/02/2015 PCP: Wenda Low, MD Interim summary: Tina Briggs is a 69 y.o. female with prior h/o hypertension, diabetes mellitus, presented to ED on 8/24 initially with RUQ abdominal pain and was found to have elevated LFTS'. She underwent ERCP by Dr Amedeo Plenty and multiple stones were removed and a stent was put in for CBD stricture. She was discharged on 8/27. But she reported persistent nausea, vomiting and abdominal pain, RUQ , associated with loose watery diarrhea since discharged and presented to ED for the above complaints.  Assessment/Plan: 1. Choledocholithiasis, s/p ERCP with sphincterotomy and stone extraction and stent placement for biliary stricture; - symptoms of nausea, vomiting and abdominal pain improved.  - advanced diet as tolerated.  - Gi consulted and recommendations given.  -- underwent ERCP today and she had sphincterotomy done over stent and the stent was removed. A small stone and some sludge and debris was also removed.    Hypertension sub optimal controlled after the procedure.   Diabetes Mellitus: CBG (last 3)   Recent Labs  06/06/15 1123 06/06/15 1432 06/06/15 1650  GLUCAP 86 90 133*    Resume SSI.    DEHYDRATION; improved with hydration.   Subtle episode of aspiration pneumonitis: Fever of 101 on 9/3 , started on primaxin for aspiration pneumonia.  Good oxygen sats.  Monitor.  Cough improving.  Can d/c antibiotics on discharge.    Code Status: full code.  Family Communication: husband  at bedside Disposition Plan: can go home in am if able to tolerate diet and no symptoms.    Consultants:  Gastroenterology.   Procedures:  ERCP on 9/6  Antibiotics:  none  HPI/Subjective: Nausea improved, no vomiting,  RUQ PAIN. Improved. No cough for 3 days.   Objective: Filed Vitals:   06/06/15 1550  BP: 174/73  Pulse: 57  Temp: 97.7 F (36.5 C)   Resp: 18    Intake/Output Summary (Last 24 hours) at 06/06/15 1824 Last data filed at 06/06/15 1130  Gross per 24 hour  Intake      0 ml  Output      0 ml  Net      0 ml   Filed Weights   06/02/15 1032 06/02/15 1721  Weight: 75.297 kg (166 lb) 77.338 kg (170 lb 8 oz)    Exam:   General:  Alert afebrile comfortable  Cardiovascular: s1s2  Respiratory: ctab  Abdomen: soft mildly tender RUQ , not distended   Musculoskeletal: no pedal edema.   Data Reviewed: Basic Metabolic Panel:  Recent Labs Lab 06/02/15 1140 06/03/15 0450 06/05/15 0404 06/06/15 0440  NA 139 139 141 142  K 4.0 3.5 4.0 3.7  CL 105 109 115* 114*  CO2 24 25 22  21*  GLUCOSE 178* 87 133* 114*  BUN 14 10 11 10   CREATININE 1.32* 1.21* 1.17* 1.06*  CALCIUM 9.0 8.2* 8.1* 8.1*   Liver Function Tests:  Recent Labs Lab 06/02/15 1140 06/03/15 0450 06/06/15 0440  AST 149* 53* 143*  ALT 230* 150* 117*  ALKPHOS 429* 309* 312*  BILITOT 2.6* 1.2 1.3*  PROT 7.2 5.9* 5.5*  ALBUMIN 3.6 2.8* 2.6*    Recent Labs Lab 06/02/15 1140  LIPASE 32   No results for input(s): AMMONIA in the last 168 hours. CBC:  Recent Labs Lab 06/02/15 1140 06/03/15 0450 06/06/15 0440  WBC 7.5 5.2 5.7  NEUTROABS 6.3  --  3.3  HGB 11.2* 9.7* 9.1*  HCT  34.0* 30.6* 28.7*  MCV 87.2 87.7 87.2  PLT 199 171 204   Cardiac Enzymes: No results for input(s): CKTOTAL, CKMB, CKMBINDEX, TROPONINI in the last 168 hours. BNP (last 3 results) No results for input(s): BNP in the last 8760 hours.  ProBNP (last 3 results) No results for input(s): PROBNP in the last 8760 hours.  CBG:  Recent Labs Lab 06/05/15 2139 06/06/15 0721 06/06/15 1123 06/06/15 1432 06/06/15 1650  GLUCAP 136* 94 86 90 133*    Recent Results (from the past 240 hour(s))  C difficile quick scan w PCR reflex     Status: None   Collection Time: 06/03/15 11:05 AM  Result Value Ref Range Status   C Diff antigen NEGATIVE NEGATIVE Final   C Diff  toxin NEGATIVE NEGATIVE Final   C Diff interpretation Negative for toxigenic C. difficile  Final     Studies: Dg Ercp  06/06/2015   CLINICAL DATA:  History of common bile duct stones. Stent removal. Balloon sweep.  EXAM: ERCP  TECHNIQUE: Multiple spot images obtained with the fluoroscopic device and submitted for interpretation post-procedure.  FLUOROSCOPY TIME:  If the device does not provide the exposure index:  Fluoroscopy Time:  6 minutes and 41 seconds.  Number of Acquired Images:  4  COMPARISON:  Abdominal CT 06/02/2015  FINDINGS: The common bile duct was cannulated with a wire and opacified with contrast. Non metallic biliary stent was removed. Evidence for a balloon sweep for stone removal.  IMPRESSION: Removal of biliary stent and balloon sweep for stone removal.  These images were submitted for radiologic interpretation only. Please see the procedural report for the amount of contrast and the fluoroscopy time utilized.   Electronically Signed   By: Markus Daft M.D.   On: 06/06/2015 14:35    Scheduled Meds: . enoxaparin (LOVENOX) injection  40 mg Subcutaneous Q24H  . imipenem-cilastatin  500 mg Intravenous 3 times per day  . insulin aspart  0-9 Units Subcutaneous TID WC  . Liraglutide  1.2 mg Injection QHS  . pantoprazole  40 mg Oral q morning - 10a  . venlafaxine XR  75 mg Oral q morning - 10a   Continuous Infusions: . sodium chloride 125 mL/hr at 06/06/15 1528    Active Problems:   Abdominal pain   Nausea & vomiting    Time spent: 25 minutes.     [facility]  [facility] Pager [phone] If 7PM-7AM, please contact night-coverage at [email or web address], password [email or web address] 06/06/2015, 6:24 PM  LOS: 4 days

## 2015-06-06 NOTE — Op Note (Signed)
[facility] [place], [address]   ERCP PROCEDURE REPORT  PATIENT: Tina Briggs, Tina Briggs  MR# :[id number] BIRTHDATE: July 25, 1946  GENDER: female ENDOSCOPIST: Clarene Essex, MD REFERRED BY: PROCEDURE DATE:  06/06/2015 PROCEDURE:   ERCP with sphincterotomy/papillotomy, ERCP with removal of calculus/calculi , and ERCP with stent removal and spyglass for direct Colangioscopy ASA CLASS:  2 INDICATIONS: probable residual CBD stone MEDICATIONS:    general anesthesia TOPICAL ANESTHETIC:  none  DESCRIPTION OF PROCEDURE:   After the risks benefits and alternatives of the procedure were thoroughly explained, informed consent was obtained.  The ercp pentax [id number]  endoscope was introduced through the mouth and advanced to the second portion of the duodenum .the previously placed biliary stent was brought into view and using the triple lumen sphincterotome loaded with the JAG Jagwire we cannulated next to the stent and deep selective cannulation was obtained and the wire was advanced into the intrahepatics and dye was injected and we proceeded with a moderate size sphincterotomy in the customary fashion and once done remove the wire and sphincterotome after easily being able to advance the fully bowed sphincterotome in and out of the duct and the stent was snared and removed with the snarethen the scope and the stent was retrieved and the scope was reinserted a second time and the sphincterotomy site was brought into view and we proceeded with repeat cannulation with the sphincterotome and the wire and went ahead and enlarge the sphincterotomy site further and then exchanged the sphincterotome for the 12-15 mm balloon and on the first pull-through a small stone was removed as well as some debris and sludge and then we proceeded with multiple 12 mm balloon pull-through's which were withdrawn readily through the duct and no additional findings were seen and we went  ahead and proceeded with an occlusion cholangiogram which was normal and just to be sure after the balloon was pulled through 1 more time we went ahead and proceeded with spyglass with nice visualization of the common bile duct up to the bifurcation including the opening of the cystic duct remnant without additional findings at this point we elected to stop the procedure and the patient tolerated the procedure wellEstimated blood loss is zero unless otherwise noted in this procedure report.       COMPLICATIONS:   none  ENDOSCOPIC IMPRESSION:1. Sphincterotomy done over stent and then increased after stent removal as above 2. Stent removed and sent for cytology with the snare 3. Small stone removal with some sludge and debris 4. No pancreatic duct injection or wire advancement 5. Negative spyglass exam of the CBD as above  RECOMMENDATIONS:observe for delayed complications if none slowly advance diet and hopefully she'll be able to go home tomorrow and follow-up with me in the office in a few weeks     _______________________________ eSigned:  Clarene Essex, MD 06/06/2015 2:15 PM   CC:  PATIENT NAME:  Tina Briggs, Tina Briggs MR#: [id number]

## 2015-06-07 ENCOUNTER — Encounter (HOSPITAL_COMMUNITY): Payer: Self-pay | Admitting: Gastroenterology

## 2015-06-07 DIAGNOSIS — N183 Chronic kidney disease, stage 3 unspecified: Secondary | ICD-10-CM

## 2015-06-07 DIAGNOSIS — K8050 Calculus of bile duct without cholangitis or cholecystitis without obstruction: Secondary | ICD-10-CM

## 2015-06-07 DIAGNOSIS — J690 Pneumonitis due to inhalation of food and vomit: Secondary | ICD-10-CM

## 2015-06-07 DIAGNOSIS — R112 Nausea with vomiting, unspecified: Secondary | ICD-10-CM

## 2015-06-07 LAB — CBC WITH DIFFERENTIAL/PLATELET
BASOS PCT: 0 % (ref 0–1)
Basophils Absolute: 0 10*3/uL (ref 0.0–0.1)
EOS ABS: 0.1 10*3/uL (ref 0.0–0.7)
EOS PCT: 2 % (ref 0–5)
HCT: 28.9 % — ABNORMAL LOW (ref 36.0–46.0)
Hemoglobin: 9.5 g/dL — ABNORMAL LOW (ref 12.0–15.0)
Lymphocytes Relative: 23 % (ref 12–46)
Lymphs Abs: 1.8 10*3/uL (ref 0.7–4.0)
MCH: 28.6 pg (ref 26.0–34.0)
MCHC: 32.9 g/dL (ref 30.0–36.0)
MCV: 87 fL (ref 78.0–100.0)
MONO ABS: 0.5 10*3/uL (ref 0.1–1.0)
MONOS PCT: 6 % (ref 3–12)
NEUTROS PCT: 69 % (ref 43–77)
Neutro Abs: 5.2 10*3/uL (ref 1.7–7.7)
PLATELETS: 221 10*3/uL (ref 150–400)
RBC: 3.32 MIL/uL — ABNORMAL LOW (ref 3.87–5.11)
RDW: 15.1 % (ref 11.5–15.5)
WBC: 7.6 10*3/uL (ref 4.0–10.5)

## 2015-06-07 LAB — COMPREHENSIVE METABOLIC PANEL
ALBUMIN: 2.8 g/dL — AB (ref 3.5–5.0)
ALT: 82 U/L — ABNORMAL HIGH (ref 14–54)
ANION GAP: 7 (ref 5–15)
AST: 32 U/L (ref 15–41)
Alkaline Phosphatase: 284 U/L — ABNORMAL HIGH (ref 38–126)
BUN: 8 mg/dL (ref 6–20)
CO2: 20 mmol/L — AB (ref 22–32)
Calcium: 8.4 mg/dL — ABNORMAL LOW (ref 8.9–10.3)
Chloride: 115 mmol/L — ABNORMAL HIGH (ref 101–111)
Creatinine, Ser: 1.01 mg/dL — ABNORMAL HIGH (ref 0.44–1.00)
GFR calc Af Amer: >60 mL/min (ref >60)
GFR calc non Af Amer: 56 mL/min — ABNORMAL LOW (ref >60)
GLUCOSE: 112 mg/dL — AB (ref 65–99)
POTASSIUM: 3.6 mmol/L (ref 3.5–5.1)
SODIUM: 142 mmol/L (ref 135–145)
TOTAL PROTEIN: 5.9 g/dL — AB (ref 6.5–8.1)
Total Bilirubin: 0.8 mg/dL (ref 0.3–1.2)

## 2015-06-07 LAB — GLUCOSE, CAPILLARY: Glucose-Capillary: 109 mg/dL — ABNORMAL HIGH (ref 65–99)

## 2015-06-07 MED ORDER — LEVOFLOXACIN 750 MG PO TABS
750.0000 mg | ORAL_TABLET | Freq: Every day | ORAL | Status: DC
  Administered 2015-06-07: 750 mg via ORAL
  Filled 2015-06-07: qty 1

## 2015-06-07 MED ORDER — METRONIDAZOLE 500 MG PO TABS: 500.0000 mg | ORAL_TABLET | Freq: Four times a day (QID) | ORAL | Status: DC

## 2015-06-07 MED ORDER — LEVOFLOXACIN 750 MG PO TABS: 750.0000 mg | ORAL_TABLET | Freq: Every day | ORAL | Status: DC

## 2015-06-07 MED ORDER — GUAIFENESIN-DM 100-10 MG/5ML PO SYRP: 5.0000 mL | ORAL_SOLUTION | ORAL | Status: DC | PRN

## 2015-06-07 NOTE — Care Management Important Message (Signed)
Important Message  Patient Details  Name: Tina Briggs MRN: [id number] Date of Birth: 1946-09-09   Medicare Important Message Given:  [facility] notification given    Camillo Flaming 06/07/2015, 12:30 PMImportant Message  Patient Details  Name: Tina Briggs MRN: [id number] Date of Birth: [birth year]   Medicare Important Message Given:  Yes-second notification given    Camillo Flaming 06/07/2015, 12:30 PM

## 2015-06-07 NOTE — Discharge Summary (Signed)
Physician Discharge Summary  Tina Briggs:[id number] DOB: Jan 08, 1946 DOA: 06/02/2015  PCP: Wenda Low, MD  Admit date: 06/02/2015 Discharge date: 06/07/2015  Time spent: 50 minutes     Discharge Condition: Stable Diet recommendation: Low-fat, low-sodium heart healthy diabetic diet  Discharge Diagnoses:  Principal Problem:   Choledocholithiasis Active Problems:   Essential hypertension   Diabetes mellitus type 2 in obese   Aspiration pneumonitis   Anemia   Chronic kidney disease, stage 3   History of present illness:  69 year old female with hypertension, diabetes with recent choledocholithiasis status post stone extraction and stenting of the CBD on 8/26. She was discharged on 8/27 but continued to have symptoms of nausea vomiting and right upper quadrant abdominal pain at home and therefore returned to the ER. She was found to be dehydrated with elevated LFTs once again.  Hospital Course:  Choledocholithiasis -Evaluated by GI and underwent ERCP 9/6 with repeat sphincterotomy, removal of stent, extraction of small stone and sludge. -Tolerating solids today- will discharge home-recommended to follow up with GI in 2 weeks and call them earlier if symptoms recur  Aspiration pneumonia -Patient developed a fever of 101 and cough on 9/3-suspected to have aspirated on vomitus-started on Primaxin due to significant allergy issues-cough improving-fever resolved-will transition to Levaquin and Flagyl to complete a 5 day course  Diabetes mellitus -Can resume Amaryl and metformin  Dehydration -Resolved with IV hydration  CKD 3 -Stable  Anemia -Slow downward trend of hemoglobin may be dilutional as there has been no obvious signs of blood loss -   - would recheck in 1-2 weeks  Procedures:  ERCP  Consultations:  GI-Dr. Watt Climes  Discharge Exam: Filed Weights   06/02/15 1032 06/02/15 1721  Weight: 75.297 kg (166 lb) 77.338 kg (170 lb 8 oz)   Filed Vitals:    06/07/15 0613  BP: 140/80  Pulse: 61  Temp: 98.6 F (37 C)  Resp: 16    General: AAO x 3, no distress Cardiovascular: RRR, no murmurs  Respiratory: clear to auscultation bilaterally GI: soft, tender in epigastrium, non-distended, bowel sound positive  Discharge Instructions You were cared for by a hospitalist during your hospital stay. If you have any questions about your discharge medications or the care you received while you were in the hospital after you are discharged, you can call the unit and asked to speak with the hospitalist on call if the hospitalist that took care of you is not available. Once you are discharged, your primary care physician will handle any further medical issues. Please note that NO REFILLS for any discharge medications will be authorized once you are discharged, as it is imperative that you return to your primary care physician (or establish a relationship with a primary care physician if you do not have one) for your aftercare needs so that they can reassess your need for medications and monitor your lab values.      Discharge Instructions    Discharge instructions    Complete by:  As directed   Low fat, low sodium, diabetic diet     Increase activity slowly    Complete by:  As directed             Medication List    TAKE these medications        acetaminophen 500 MG tablet  Commonly known as:  TYLENOL  Take 1,000 mg by mouth daily as needed for headache.     CRESTOR 40 MG tablet  Generic drug:  rosuvastatin  TAKE 1 TABLET AT BEDTIME     fluticasone 50 MCG/ACT nasal spray  Commonly known as:  FLONASE  Place 2 sprays into both nostrils daily as needed for allergies.     glimepiride 2 MG tablet  Commonly known as:  AMARYL  Take 1 mg by mouth 2 (two) times daily.     guaiFENesin-dextromethorphan 100-10 MG/5ML syrup  Commonly known as:  ROBITUSSIN DM  Take 5 mLs by mouth every 4 (four) hours as needed for cough.     ibuprofen 200 MG  tablet  Commonly known as:  ADVIL,MOTRIN  Take 400 mg by mouth every 6 (six) hours as needed for pain, fever or headache.     iron polysaccharides 150 MG capsule  Commonly known as:  NIFEREX  Take 150 mg by mouth every morning.     levofloxacin 750 MG tablet  Commonly known as:  LEVAQUIN  Take 1 tablet (750 mg total) by mouth daily.  Start taking on:  06/08/2015     losartan 100 MG tablet  Commonly known as:  COZAAR  Take 1 tablet by mouth daily.     metFORMIN 1000 MG tablet  Commonly known as:  GLUCOPHAGE  Take 1 tablet (1,000 mg total) by mouth 2 (two) times daily with a meal.     metroNIDAZOLE 500 MG tablet  Commonly known as:  FLAGYL  Take 1 tablet (500 mg total) by mouth every 6 (six) hours.     ondansetron 4 MG disintegrating tablet  Commonly known as:  ZOFRAN ODT  Take 1 tablet (4 mg total) by mouth every 8 (eight) hours as needed for nausea or vomiting.     pantoprazole 40 MG tablet  Commonly known as:  PROTONIX  Take 40 mg by mouth every morning.     venlafaxine XR 75 MG 24 hr capsule  Commonly known as:  EFFEXOR-XR  Take 75 mg by mouth every morning.     VICTOZA 18 MG/3ML Sopn  Generic drug:  Liraglutide  Inject 1.2 mg as directed at bedtime.     vitamin B-12 1000 MCG tablet  Commonly known as:  CYANOCOBALAMIN  Take 1,000 mcg by mouth 3 (three) times a week. Take on Sundays, Wednesdays and Fridays.     VITAMIN D PO  Take 1 tablet by mouth every morning.       Allergies  Allergen Reactions  . Codeine Shortness Of Breath  . Hydrocodone Itching  . Tetracyclines & Related Itching  . Azithromycin Itching and Rash  . Erythromycin Itching and Rash  . Morphine And Related Itching and Rash  . Penicillins Itching and Rash  . Septra [Sulfamethoxazole-Trimethoprim] Itching and Rash   Follow-up Information    Follow up with [facility], MD. Schedule an appointment as soon as possible for a visit in 2 weeks.   Specialty:  Gastroenterology   Why:  call if  symptoms recurr   Contact information:   [address] [place] [address] [phone]        The results of significant diagnostics from this hospitalization (including imaging, microbiology, ancillary and laboratory) are listed below for reference.    Significant Diagnostic Studies: Dg Chest 2 View  06/03/2015   CLINICAL DATA:  Right-sided chest pain for 1 week  EXAM: CHEST  2 VIEW  COMPARISON:  05/29/2015  FINDINGS: Normal cardiac silhouette. Small hiatal hernia noted. Small focus of airspace disease in the RIGHT lower lobe. No consolidation. No pneumothorax.  IMPRESSION: Small focus of  airspace disease in the RIGHT lower lobe suggests pneumonia or aspiration pneumonitis. This is a subtle finding.  Small hiatal hernia.   Electronically Signed   By: Suzy Bouchard M.D.   On: 06/03/2015 11:16   US Abdomen Complete  05/29/2015   CLINICAL DATA:  Abdominal pain radiating to the back. Abnormal liver function tests.  EXAM: ULTRASOUND ABDOMEN COMPLETE  COMPARISON:  Radiographs 05/29/2015.  MRI 05/17/2015.  FINDINGS: Gallbladder: Cholecystectomy  Common bile duct: Diameter: 8 mm  Liver: There is intrahepatic duct dilatation. No focal liver lesion is evident.  IVC: No abnormality visualized.  Pancreas: No parenchymal abnormality. There is a mildly prominent duct, 2 mm.  Spleen: Size and appearance within normal limits.  Right Kidney: Length: 10.0 cm. Echogenicity within normal limits. No mass or hydronephrosis visualized.  Left Kidney: Length: 10.2 cm. Echogenicity within normal limits. No mass or hydronephrosis visualized.  Abdominal aorta: No aneurysm visualized.  Other findings: Biliary stent not well seen.  IMPRESSION: Intrahepatic bile duct dilatation. No focal liver lesion. Biliary stent is not conclusively identified on this study but was visible on the radiographs.   Electronically Signed   By: Andreas Newport M.D.   On: 05/29/2015 05:00   Ct Abdomen Pelvis W  Contrast  06/02/2015   CLINICAL DATA:  Recent ERCP and stent placement. Patient complains of back and abdominal pain. History of choledocholithiasis. Elevated alkaline phosphatase and total bilirubin levels.  EXAM: CT ABDOMEN AND PELVIS WITH CONTRAST  TECHNIQUE: Multidetector CT imaging of the abdomen and pelvis was performed using the standard protocol following bolus administration of intravenous contrast.  CONTRAST:  28mL OMNIPAQUE IOHEXOL 300 MG/ML  SOLN  COMPARISON:  MR 05/17/2015  FINDINGS: Mild volume loss at the lung bases. No pleural effusions. No evidence for free intra-abdominal air.  There is extensive pneumobilia. The extrahepatic bile duct is dilated measuring up to 9 mm on sequence 2, image 28. Extrahepatic biliary dilatation has not significantly changed from the prior MRI. There is a non metallic stent in the common bile duct that extends into the duodenum. There continues to be moderate intrahepatic biliary dilatation. Central left hepatic duct measures up to 1.4 cm and previously measured 1.0 cm. Gallbladder has been removed. No focal liver lesions and the portal venous system is patent.  Normal appearance of the spleen. No pancreatic duct dilatation. Normal appearance of the bilateral adrenal glands. Normal appearance of both kidneys. Subtle cystic structure in the left kidney interpolar region is better characterized on the prior MRI. Distention of the urinary bladder.  Again noted is a moderate-sized hiatal hernia. No acute abnormality involving the stomach or duodenum.  No significant free fluid or lymphadenopathy. Uterus is absent. Diverticulosis in the left colon without acute inflammatory changes involving the colon. No acute abnormalities involving the small bowel.  No acute bone abnormality.  Periumbilical hernia containing fat.  IMPRESSION: Interval placement of a common bile duct stent. There is extensive pneumobilia related to the biliary stent. There may be slightly increased  intrahepatic biliary dilatation despite placement of the stent.  Extensive diverticulosis in left colon without acute colonic inflammation.   Electronically Signed   By: Markus Daft M.D.   On: 06/02/2015 14:00   Dg Ercp  06/06/2015   CLINICAL DATA:  History of common bile duct stones. Stent removal. Balloon sweep.  EXAM: ERCP  TECHNIQUE: Multiple spot images obtained with the fluoroscopic device and submitted for interpretation post-procedure.  FLUOROSCOPY TIME:  If the device does not provide the exposure  index:  Fluoroscopy Time:  6 minutes and 41 seconds.  Number of Acquired Images:  4  COMPARISON:  Abdominal CT 06/02/2015  FINDINGS: The common bile duct was cannulated with a wire and opacified with contrast. Non metallic biliary stent was removed. Evidence for a balloon sweep for stone removal.  IMPRESSION: Removal of biliary stent and balloon sweep for stone removal.  These images were submitted for radiologic interpretation only. Please see the procedural report for the amount of contrast and the fluoroscopy time utilized.   Electronically Signed   By: Markus Daft M.D.   On: 06/06/2015 14:35   Dg Ercp Biliary & Pancreatic Ducts  05/26/2015   CLINICAL DATA:  69 year old female with a history of common bile duct stone.  EXAM: ERCP  TECHNIQUE: Multiple spot images obtained with the fluoroscopic device and submitted for interpretation post-procedure.  FLUOROSCOPY TIME:  Fluoroscopy Time:  4 minutes 46 seconds  COMPARISON:  MRI 05/17/2015  FINDINGS: Spot images during ERCP demonstrates surgical instruments over the upper abdomen, with and a scope in the first/ second portion the duodenum. Surgical changes of cholecystectomy.  Cannulation of the ampulla with retrograde infusion of contrast.  Balloon basket within the common bile duct.  Final images demonstrate a plastic stent through the ampulla with partial evacuation of contrast from the ductal system.  Contrast not visualized to cross into the duodenum.   IMPRESSION: Intraoperative images during ERCP demonstrate balloon basket in the extrahepatic ductal system with no large filling defects identified.  Final image demonstrates plastic stent across the ampulla.  Please refer to the dictated operative report for full details of intraoperative findings and procedure.  Signed,  Dulcy Fanny. Earleen Newport DO  [facility]  [facility]  These images were submitted for radiologic interpretation only. Please see the procedural report for the amount of contrast and the fluoroscopy time utilized.   Electronically Signed   By: Corrie Mckusick D.O.   On: 05/26/2015 15:30   Dg Abd Acute W/chest  05/29/2015   CLINICAL DATA:  Abdominal pain radiating to the back. Recent ERCP with stent placement.  EXAM: DG ABDOMEN ACUTE W/ 1V CHEST  COMPARISON:  ERCP images 05/26/15.  MRI 05/17/2015.  FINDINGS: The biliary stent is visible and is in the expected location of the distal CBD/duodenum. The abdominal gas pattern is negative for obstruction. There is no free air. The upright view of the chest is negative for significant cardiopulmonary abnormality.  IMPRESSION: Normal bowel gas pattern. Unremarkable appearances of the biliary stent. No acute cardiopulmonary finding.   Electronically Signed   By: Andreas Newport M.D.   On: 05/29/2015 03:22   Mr Jeananne Rama W/wo Cm/mrcp  05/17/2015   CLINICAL DATA:  69 year old female with multiple episodes of mid to upper right sided abdominal pain since February 2016.  EXAM: MRI ABDOMEN WITHOUT AND WITH CONTRAST (INCLUDING MRCP)  TECHNIQUE: Multiplanar multisequence MR imaging of the abdomen was performed both before and after the administration of intravenous contrast. Heavily T2-weighted images of the biliary and pancreatic ducts were obtained, and three-dimensional MRCP images were rendered by post processing.  CONTRAST:  69mL MULTIHANCE GADOBENATE DIMEGLUMINE 529 MG/ML IV SOLN  COMPARISON:  No priors.  FINDINGS: BUN  and creatinine were obtained on site at [facility] at  [address]  Results:  Creatinine 1.2 mg/dL.  GFR 46  Lower chest:  Large hiatal hernia.  Hepatobiliary: No cystic or solid hepatic lesions. Status post cholecystectomy. MRCP images demonstrate moderate intra  and extrahepatic biliary ductal dilatation. In the intrahepatic bile ducts to the left lobe of the liver (image 13 of series 7, image 14 of series 10, and image 16 of series 11) there is a 4 mm filling defect, compatible with intraductal calculus. On several of the T2 weighted sequences, there is an apparent filling defect in the distal common bile duct. However, this is favored to be related to flow artifact in the duct. The narrowing of the distal common bile duct on the MRCP images appears to be extrinsic, and postcontrast images demonstrate narrowing of the lumen, most compatible with a severe distal ductal stricture. No definite mass identified.  Pancreas: No pancreatic mass. No pancreatic ductal dilatation. No pancreatic or peripancreatic fluid or inflammatory changes.  Spleen: Unremarkable.  Adrenals/Urinary Tract: Bilateral adrenal glands are normal in appearance. Right kidney is normal in appearance. In the interpolar region of the left kidney there is a sub cm T1 hypointense, T2 hyperintense, nonenhancing lesion, compatible with a simple cyst.  Stomach/Bowel: The intra abdominal portion of the stomach is normal in appearance. No pathologic dilatation of the visualized portions of small bowel or colon.  Vascular/Lymphatic: No aneurysm identified in the visualized abdominal vasculature. No lymphadenopathy noted in the abdomen.  Other: No significant volume of ascites noted in the visualized peritoneal cavity.  Musculoskeletal: No aggressive osseous lesions identified.  IMPRESSION: 1. Distal common bile duct stricture with moderate intra and extrahepatic biliary ductal dilatation. 2. 4 mm intraductal calculus in the left intrahepatic  bile ducts. 3. Status post cholecystectomy. 4. Additional incidental findings, as above.   Electronically Signed   By: Vinnie Langton M.D.   On: 05/17/2015 11:01    Microbiology: Recent Results (from the past 240 hour(s))  C difficile quick scan w PCR reflex     Status: None   Collection Time: 06/03/15 11:05 AM  Result Value Ref Range Status   C Diff antigen NEGATIVE NEGATIVE Final   C Diff toxin NEGATIVE NEGATIVE Final   C Diff interpretation Negative for toxigenic C. difficile  Final     Labs: Basic Metabolic Panel:  Recent Labs Lab 06/02/15 1140 06/03/15 0450 06/05/15 0404 06/06/15 0440 06/07/15 0413  NA 139 139 141 142 142  K 4.0 3.5 4.0 3.7 3.6  CL 105 109 115* 114* 115*  CO2 24 25 22  21* 20*  GLUCOSE 178* 87 133* 114* 112*  BUN 14 10 11 10 8   CREATININE 1.32* 1.21* 1.17* 1.06* 1.01*  CALCIUM 9.0 8.2* 8.1* 8.1* 8.4*   Liver Function Tests:  Recent Labs Lab 06/02/15 1140 06/03/15 0450 06/06/15 0440 06/07/15 0413  AST 149* 53* 143* 32  ALT 230* 150* 117* 82*  ALKPHOS 429* 309* 312* 284*  BILITOT 2.6* 1.2 1.3* 0.8  PROT 7.2 5.9* 5.5* 5.9*  ALBUMIN 3.6 2.8* 2.6* 2.8*    Recent Labs Lab 06/02/15 1140  LIPASE 32   No results for input(s): AMMONIA in the last 168 hours. CBC:  Recent Labs Lab 06/02/15 1140 06/03/15 0450 06/06/15 0440 06/07/15 0413  WBC 7.5 5.2 5.7 7.6  NEUTROABS 6.3  --  3.3 5.2  HGB 11.2* 9.7* 9.1* 9.5*  HCT 34.0* 30.6* 28.7* 28.9*  MCV 87.2 87.7 87.2 87.0  PLT 199 171 204 221   Cardiac Enzymes: No results for input(s): CKTOTAL, CKMB, CKMBINDEX, TROPONINI in the last 168 hours. BNP: BNP (last 3 results) No results for input(s): BNP in the last 8760 hours.  ProBNP (last 3 results) No results for input(s): PROBNP  in the last 8760 hours.  CBG:  Recent Labs Lab 06/06/15 0721 06/06/15 1123 06/06/15 1432 06/06/15 1650 06/06/15 2111  GLUCAP 94 86 90 133* 133*       SignedDebbe Odea, MD [facility] 06/07/2015, 10:31 AM

## 2015-06-07 NOTE — Progress Notes (Signed)
Pt discharged to home. DC instructions given with spouse at bedside. No concerns voiced. Pt encouraged to stop by [facility] and pick up meds that were e-prescribed at discharged. Voiced understanding. Left unit in wheelchair pushed by nurse tech. Left in good condition. Vwilliams,rn.

## 2015-06-14 ENCOUNTER — Other Ambulatory Visit: Payer: Self-pay | Admitting: Internal Medicine

## 2015-06-14 ENCOUNTER — Ambulatory Visit
Admission: RE | Admit: 2015-06-14 | Discharge: 2015-06-14 | Disposition: A | Discharge status: Home / Self Care | Payer: Medicare Other | Source: Ambulatory Visit | Attending: Internal Medicine | Admitting: Internal Medicine

## 2015-06-14 DIAGNOSIS — J189 Pneumonia, unspecified organism: Secondary | ICD-10-CM

## 2015-06-14 DIAGNOSIS — R0602 Shortness of breath: Secondary | ICD-10-CM | POA: Diagnosis not present

## 2015-06-14 DIAGNOSIS — K8050 Calculus of bile duct without cholangitis or cholecystitis without obstruction: Secondary | ICD-10-CM | POA: Diagnosis not present

## 2015-06-26 DIAGNOSIS — K760 Fatty (change of) liver, not elsewhere classified: Secondary | ICD-10-CM | POA: Diagnosis not present

## 2015-06-26 DIAGNOSIS — D509 Iron deficiency anemia, unspecified: Secondary | ICD-10-CM | POA: Diagnosis not present

## 2015-06-26 DIAGNOSIS — K8020 Calculus of gallbladder without cholecystitis without obstruction: Secondary | ICD-10-CM | POA: Diagnosis not present

## 2015-06-26 DIAGNOSIS — K831 Obstruction of bile duct: Secondary | ICD-10-CM | POA: Diagnosis not present

## 2015-06-26 DIAGNOSIS — R109 Unspecified abdominal pain: Secondary | ICD-10-CM | POA: Diagnosis not present

## 2015-07-10 DIAGNOSIS — K8020 Calculus of gallbladder without cholecystitis without obstruction: Secondary | ICD-10-CM | POA: Diagnosis not present

## 2015-07-10 DIAGNOSIS — E611 Iron deficiency: Secondary | ICD-10-CM | POA: Diagnosis not present

## 2015-07-10 DIAGNOSIS — K831 Obstruction of bile duct: Secondary | ICD-10-CM | POA: Diagnosis not present

## 2015-07-29 DIAGNOSIS — Z23 Encounter for immunization: Secondary | ICD-10-CM | POA: Diagnosis not present

## 2015-08-14 DIAGNOSIS — E1122 Type 2 diabetes mellitus with diabetic chronic kidney disease: Secondary | ICD-10-CM | POA: Diagnosis not present

## 2015-08-14 DIAGNOSIS — R251 Tremor, unspecified: Secondary | ICD-10-CM | POA: Diagnosis not present

## 2015-08-14 DIAGNOSIS — Z794 Long term (current) use of insulin: Secondary | ICD-10-CM | POA: Diagnosis not present

## 2015-10-31 DIAGNOSIS — Z794 Long term (current) use of insulin: Secondary | ICD-10-CM | POA: Diagnosis not present

## 2015-10-31 DIAGNOSIS — N183 Chronic kidney disease, stage 3 (moderate): Secondary | ICD-10-CM | POA: Diagnosis not present

## 2015-10-31 DIAGNOSIS — E782 Mixed hyperlipidemia: Secondary | ICD-10-CM | POA: Diagnosis not present

## 2015-10-31 DIAGNOSIS — I10 Essential (primary) hypertension: Secondary | ICD-10-CM | POA: Diagnosis not present

## 2015-10-31 DIAGNOSIS — E1122 Type 2 diabetes mellitus with diabetic chronic kidney disease: Secondary | ICD-10-CM | POA: Diagnosis not present

## 2015-10-31 DIAGNOSIS — F419 Anxiety disorder, unspecified: Secondary | ICD-10-CM | POA: Diagnosis not present

## 2015-10-31 DIAGNOSIS — E538 Deficiency of other specified B group vitamins: Secondary | ICD-10-CM | POA: Diagnosis not present

## 2015-11-02 DIAGNOSIS — L821 Other seborrheic keratosis: Secondary | ICD-10-CM | POA: Diagnosis not present

## 2015-11-02 DIAGNOSIS — D2239 Melanocytic nevi of other parts of face: Secondary | ICD-10-CM | POA: Diagnosis not present

## 2015-11-02 DIAGNOSIS — D2262 Melanocytic nevi of left upper limb, including shoulder: Secondary | ICD-10-CM | POA: Diagnosis not present

## 2015-11-02 DIAGNOSIS — D485 Neoplasm of uncertain behavior of skin: Secondary | ICD-10-CM | POA: Diagnosis not present

## 2015-11-02 DIAGNOSIS — D2271 Melanocytic nevi of right lower limb, including hip: Secondary | ICD-10-CM | POA: Diagnosis not present

## 2015-11-02 DIAGNOSIS — C4441 Basal cell carcinoma of skin of scalp and neck: Secondary | ICD-10-CM | POA: Diagnosis not present

## 2015-11-02 DIAGNOSIS — L720 Epidermal cyst: Secondary | ICD-10-CM | POA: Diagnosis not present

## 2015-11-02 DIAGNOSIS — D225 Melanocytic nevi of trunk: Secondary | ICD-10-CM | POA: Diagnosis not present

## 2015-11-02 DIAGNOSIS — D2261 Melanocytic nevi of right upper limb, including shoulder: Secondary | ICD-10-CM | POA: Diagnosis not present

## 2015-11-02 DIAGNOSIS — L820 Inflamed seborrheic keratosis: Secondary | ICD-10-CM | POA: Diagnosis not present

## 2015-11-02 DIAGNOSIS — L723 Sebaceous cyst: Secondary | ICD-10-CM | POA: Diagnosis not present

## 2015-11-13 DIAGNOSIS — C4441 Basal cell carcinoma of skin of scalp and neck: Secondary | ICD-10-CM | POA: Diagnosis not present

## 2015-11-13 DIAGNOSIS — Z85828 Personal history of other malignant neoplasm of skin: Secondary | ICD-10-CM | POA: Diagnosis not present

## 2016-01-03 DIAGNOSIS — Z6829 Body mass index (BMI) 29.0-29.9, adult: Secondary | ICD-10-CM | POA: Diagnosis not present

## 2016-01-03 DIAGNOSIS — Z1231 Encounter for screening mammogram for malignant neoplasm of breast: Secondary | ICD-10-CM | POA: Diagnosis not present

## 2016-01-03 DIAGNOSIS — Z01419 Encounter for gynecological examination (general) (routine) without abnormal findings: Secondary | ICD-10-CM | POA: Diagnosis not present

## 2016-02-14 DIAGNOSIS — Z5181 Encounter for therapeutic drug level monitoring: Secondary | ICD-10-CM | POA: Diagnosis not present

## 2016-02-14 DIAGNOSIS — Z794 Long term (current) use of insulin: Secondary | ICD-10-CM | POA: Diagnosis not present

## 2016-02-14 DIAGNOSIS — E1122 Type 2 diabetes mellitus with diabetic chronic kidney disease: Secondary | ICD-10-CM | POA: Diagnosis not present

## 2016-02-14 DIAGNOSIS — Z79899 Other long term (current) drug therapy: Secondary | ICD-10-CM | POA: Diagnosis not present

## 2016-02-22 DIAGNOSIS — H353132 Nonexudative age-related macular degeneration, bilateral, intermediate dry stage: Secondary | ICD-10-CM | POA: Diagnosis not present

## 2016-02-22 DIAGNOSIS — E119 Type 2 diabetes mellitus without complications: Secondary | ICD-10-CM | POA: Diagnosis not present

## 2016-02-22 DIAGNOSIS — H43813 Vitreous degeneration, bilateral: Secondary | ICD-10-CM | POA: Diagnosis not present

## 2016-02-22 DIAGNOSIS — Z0100 Encounter for examination of eyes and vision without abnormal findings: Secondary | ICD-10-CM | POA: Diagnosis not present

## 2016-02-27 DIAGNOSIS — R609 Edema, unspecified: Secondary | ICD-10-CM | POA: Diagnosis not present

## 2016-02-27 DIAGNOSIS — M79661 Pain in right lower leg: Secondary | ICD-10-CM | POA: Diagnosis not present

## 2016-02-27 DIAGNOSIS — L309 Dermatitis, unspecified: Secondary | ICD-10-CM | POA: Diagnosis not present

## 2016-03-01 ENCOUNTER — Ambulatory Visit
Admission: RE | Admit: 2016-03-01 | Discharge: 2016-03-01 | Disposition: A | Discharge status: Home / Self Care | Payer: Medicare Other | Source: Ambulatory Visit | Attending: Internal Medicine | Admitting: Internal Medicine

## 2016-03-01 ENCOUNTER — Other Ambulatory Visit: Payer: Self-pay | Admitting: Internal Medicine

## 2016-03-01 DIAGNOSIS — M79661 Pain in right lower leg: Secondary | ICD-10-CM

## 2016-03-01 DIAGNOSIS — M7989 Other specified soft tissue disorders: Principal | ICD-10-CM

## 2016-03-01 DIAGNOSIS — M79605 Pain in left leg: Secondary | ICD-10-CM

## 2016-03-01 DIAGNOSIS — R600 Localized edema: Secondary | ICD-10-CM | POA: Diagnosis not present

## 2016-03-26 DIAGNOSIS — F322 Major depressive disorder, single episode, severe without psychotic features: Secondary | ICD-10-CM | POA: Diagnosis not present

## 2016-03-26 DIAGNOSIS — I10 Essential (primary) hypertension: Secondary | ICD-10-CM | POA: Diagnosis not present

## 2016-03-26 DIAGNOSIS — Z1389 Encounter for screening for other disorder: Secondary | ICD-10-CM | POA: Diagnosis not present

## 2016-03-26 DIAGNOSIS — E538 Deficiency of other specified B group vitamins: Secondary | ICD-10-CM | POA: Diagnosis not present

## 2016-03-26 DIAGNOSIS — Z794 Long term (current) use of insulin: Secondary | ICD-10-CM | POA: Diagnosis not present

## 2016-03-26 DIAGNOSIS — Z6829 Body mass index (BMI) 29.0-29.9, adult: Secondary | ICD-10-CM | POA: Diagnosis not present

## 2016-03-26 DIAGNOSIS — Z0000 Encounter for general adult medical examination without abnormal findings: Secondary | ICD-10-CM | POA: Diagnosis not present

## 2016-03-26 DIAGNOSIS — E782 Mixed hyperlipidemia: Secondary | ICD-10-CM | POA: Diagnosis not present

## 2016-03-26 DIAGNOSIS — N183 Chronic kidney disease, stage 3 (moderate): Secondary | ICD-10-CM | POA: Diagnosis not present

## 2016-03-26 DIAGNOSIS — E1122 Type 2 diabetes mellitus with diabetic chronic kidney disease: Secondary | ICD-10-CM | POA: Diagnosis not present

## 2016-09-14 DIAGNOSIS — Z23 Encounter for immunization: Secondary | ICD-10-CM | POA: Diagnosis not present

## 2016-10-03 DIAGNOSIS — Z7984 Long term (current) use of oral hypoglycemic drugs: Secondary | ICD-10-CM | POA: Diagnosis not present

## 2016-10-03 DIAGNOSIS — E1122 Type 2 diabetes mellitus with diabetic chronic kidney disease: Secondary | ICD-10-CM | POA: Diagnosis not present

## 2016-10-03 DIAGNOSIS — Z5181 Encounter for therapeutic drug level monitoring: Secondary | ICD-10-CM | POA: Diagnosis not present

## 2016-12-02 DIAGNOSIS — K219 Gastro-esophageal reflux disease without esophagitis: Secondary | ICD-10-CM | POA: Diagnosis not present

## 2016-12-02 DIAGNOSIS — Z7984 Long term (current) use of oral hypoglycemic drugs: Secondary | ICD-10-CM | POA: Diagnosis not present

## 2016-12-02 DIAGNOSIS — E538 Deficiency of other specified B group vitamins: Secondary | ICD-10-CM | POA: Diagnosis not present

## 2016-12-02 DIAGNOSIS — E1122 Type 2 diabetes mellitus with diabetic chronic kidney disease: Secondary | ICD-10-CM | POA: Diagnosis not present

## 2016-12-02 DIAGNOSIS — N183 Chronic kidney disease, stage 3 (moderate): Secondary | ICD-10-CM | POA: Diagnosis not present

## 2016-12-02 DIAGNOSIS — I10 Essential (primary) hypertension: Secondary | ICD-10-CM | POA: Diagnosis not present

## 2016-12-02 DIAGNOSIS — F322 Major depressive disorder, single episode, severe without psychotic features: Secondary | ICD-10-CM | POA: Diagnosis not present

## 2017-01-06 DIAGNOSIS — K5732 Diverticulitis of large intestine without perforation or abscess without bleeding: Secondary | ICD-10-CM | POA: Diagnosis not present

## 2017-01-23 DIAGNOSIS — D1801 Hemangioma of skin and subcutaneous tissue: Secondary | ICD-10-CM | POA: Diagnosis not present

## 2017-01-23 DIAGNOSIS — L814 Other melanin hyperpigmentation: Secondary | ICD-10-CM | POA: Diagnosis not present

## 2017-01-23 DIAGNOSIS — D2262 Melanocytic nevi of left upper limb, including shoulder: Secondary | ICD-10-CM | POA: Diagnosis not present

## 2017-01-23 DIAGNOSIS — D2271 Melanocytic nevi of right lower limb, including hip: Secondary | ICD-10-CM | POA: Diagnosis not present

## 2017-01-23 DIAGNOSIS — Z85828 Personal history of other malignant neoplasm of skin: Secondary | ICD-10-CM | POA: Diagnosis not present

## 2017-01-23 DIAGNOSIS — D2261 Melanocytic nevi of right upper limb, including shoulder: Secondary | ICD-10-CM | POA: Diagnosis not present

## 2017-01-23 DIAGNOSIS — D2272 Melanocytic nevi of left lower limb, including hip: Secondary | ICD-10-CM | POA: Diagnosis not present

## 2017-01-23 DIAGNOSIS — D225 Melanocytic nevi of trunk: Secondary | ICD-10-CM | POA: Diagnosis not present

## 2017-01-23 DIAGNOSIS — L821 Other seborrheic keratosis: Secondary | ICD-10-CM | POA: Diagnosis not present

## 2017-03-24 DIAGNOSIS — H43813 Vitreous degeneration, bilateral: Secondary | ICD-10-CM | POA: Diagnosis not present

## 2017-03-24 DIAGNOSIS — H52203 Unspecified astigmatism, bilateral: Secondary | ICD-10-CM | POA: Diagnosis not present

## 2017-03-24 DIAGNOSIS — H353131 Nonexudative age-related macular degeneration, bilateral, early dry stage: Secondary | ICD-10-CM | POA: Diagnosis not present

## 2017-03-24 DIAGNOSIS — E119 Type 2 diabetes mellitus without complications: Secondary | ICD-10-CM | POA: Diagnosis not present

## 2017-04-15 DIAGNOSIS — Z5181 Encounter for therapeutic drug level monitoring: Secondary | ICD-10-CM | POA: Diagnosis not present

## 2017-04-15 DIAGNOSIS — E1122 Type 2 diabetes mellitus with diabetic chronic kidney disease: Secondary | ICD-10-CM | POA: Diagnosis not present

## 2017-04-15 DIAGNOSIS — D509 Iron deficiency anemia, unspecified: Secondary | ICD-10-CM | POA: Diagnosis not present

## 2017-04-15 DIAGNOSIS — R1110 Vomiting, unspecified: Secondary | ICD-10-CM | POA: Diagnosis not present

## 2017-04-15 DIAGNOSIS — Z79899 Other long term (current) drug therapy: Secondary | ICD-10-CM | POA: Diagnosis not present

## 2017-04-22 DIAGNOSIS — N183 Chronic kidney disease, stage 3 (moderate): Secondary | ICD-10-CM | POA: Diagnosis not present

## 2017-05-15 DIAGNOSIS — K219 Gastro-esophageal reflux disease without esophagitis: Secondary | ICD-10-CM | POA: Diagnosis not present

## 2017-05-15 DIAGNOSIS — E1122 Type 2 diabetes mellitus with diabetic chronic kidney disease: Secondary | ICD-10-CM | POA: Diagnosis not present

## 2017-05-15 DIAGNOSIS — E538 Deficiency of other specified B group vitamins: Secondary | ICD-10-CM | POA: Diagnosis not present

## 2017-05-15 DIAGNOSIS — Z0000 Encounter for general adult medical examination without abnormal findings: Secondary | ICD-10-CM | POA: Diagnosis not present

## 2017-05-15 DIAGNOSIS — F322 Major depressive disorder, single episode, severe without psychotic features: Secondary | ICD-10-CM | POA: Diagnosis not present

## 2017-05-15 DIAGNOSIS — N183 Chronic kidney disease, stage 3 (moderate): Secondary | ICD-10-CM | POA: Diagnosis not present

## 2017-05-15 DIAGNOSIS — Z7984 Long term (current) use of oral hypoglycemic drugs: Secondary | ICD-10-CM | POA: Diagnosis not present

## 2017-05-15 DIAGNOSIS — E1165 Type 2 diabetes mellitus with hyperglycemia: Secondary | ICD-10-CM | POA: Diagnosis not present

## 2017-05-15 DIAGNOSIS — E782 Mixed hyperlipidemia: Secondary | ICD-10-CM | POA: Diagnosis not present

## 2017-05-15 DIAGNOSIS — M1990 Unspecified osteoarthritis, unspecified site: Secondary | ICD-10-CM | POA: Diagnosis not present

## 2017-05-15 DIAGNOSIS — I10 Essential (primary) hypertension: Secondary | ICD-10-CM | POA: Diagnosis not present

## 2017-05-15 DIAGNOSIS — F411 Generalized anxiety disorder: Secondary | ICD-10-CM | POA: Diagnosis not present

## 2017-07-02 DIAGNOSIS — Z23 Encounter for immunization: Secondary | ICD-10-CM | POA: Diagnosis not present

## 2017-07-09 DIAGNOSIS — Z01419 Encounter for gynecological examination (general) (routine) without abnormal findings: Secondary | ICD-10-CM | POA: Diagnosis not present

## 2017-07-09 DIAGNOSIS — Z6831 Body mass index (BMI) 31.0-31.9, adult: Secondary | ICD-10-CM | POA: Diagnosis not present

## 2017-07-09 DIAGNOSIS — Z1231 Encounter for screening mammogram for malignant neoplasm of breast: Secondary | ICD-10-CM | POA: Diagnosis not present

## 2017-07-09 DIAGNOSIS — N6489 Other specified disorders of breast: Secondary | ICD-10-CM | POA: Diagnosis not present

## 2017-07-09 DIAGNOSIS — L298 Other pruritus: Secondary | ICD-10-CM | POA: Diagnosis not present

## 2017-07-14 DIAGNOSIS — E1129 Type 2 diabetes mellitus with other diabetic kidney complication: Secondary | ICD-10-CM | POA: Diagnosis not present

## 2017-07-14 DIAGNOSIS — D509 Iron deficiency anemia, unspecified: Secondary | ICD-10-CM | POA: Diagnosis not present

## 2017-07-14 DIAGNOSIS — D631 Anemia in chronic kidney disease: Secondary | ICD-10-CM | POA: Diagnosis not present

## 2017-07-14 DIAGNOSIS — N183 Chronic kidney disease, stage 3 (moderate): Secondary | ICD-10-CM | POA: Diagnosis not present

## 2017-07-14 DIAGNOSIS — R3129 Other microscopic hematuria: Secondary | ICD-10-CM | POA: Diagnosis not present

## 2017-07-14 DIAGNOSIS — R809 Proteinuria, unspecified: Secondary | ICD-10-CM | POA: Diagnosis not present

## 2017-07-14 DIAGNOSIS — I129 Hypertensive chronic kidney disease with stage 1 through stage 4 chronic kidney disease, or unspecified chronic kidney disease: Secondary | ICD-10-CM | POA: Diagnosis not present

## 2017-07-15 DIAGNOSIS — N958 Other specified menopausal and perimenopausal disorders: Secondary | ICD-10-CM | POA: Diagnosis not present

## 2017-07-18 DIAGNOSIS — Z6831 Body mass index (BMI) 31.0-31.9, adult: Secondary | ICD-10-CM | POA: Diagnosis not present

## 2017-07-18 DIAGNOSIS — Z5181 Encounter for therapeutic drug level monitoring: Secondary | ICD-10-CM | POA: Diagnosis not present

## 2017-07-18 DIAGNOSIS — E1122 Type 2 diabetes mellitus with diabetic chronic kidney disease: Secondary | ICD-10-CM | POA: Diagnosis not present

## 2017-07-18 DIAGNOSIS — Z7984 Long term (current) use of oral hypoglycemic drugs: Secondary | ICD-10-CM | POA: Diagnosis not present

## 2017-07-29 DIAGNOSIS — R809 Proteinuria, unspecified: Secondary | ICD-10-CM | POA: Diagnosis not present

## 2017-08-06 DIAGNOSIS — M5432 Sciatica, left side: Secondary | ICD-10-CM | POA: Diagnosis not present

## 2017-08-18 ENCOUNTER — Encounter: Payer: Self-pay | Admitting: Hematology

## 2017-08-18 ENCOUNTER — Telehealth: Payer: Self-pay | Admitting: Hematology

## 2017-08-18 NOTE — Telephone Encounter (Signed)
Appt has been scheduled for the pt to see Dr. Burr Medico on 11/27 at 11am. Address and insurance verified. Letter mailed to the pt and faxed to the referring.

## 2017-08-26 ENCOUNTER — Ambulatory Visit (HOSPITAL_BASED_OUTPATIENT_CLINIC_OR_DEPARTMENT_OTHER): Payer: Medicare Other

## 2017-08-26 ENCOUNTER — Encounter: Payer: Self-pay | Admitting: Hematology

## 2017-08-26 ENCOUNTER — Telehealth: Payer: Self-pay | Admitting: Hematology

## 2017-08-26 ENCOUNTER — Ambulatory Visit (HOSPITAL_BASED_OUTPATIENT_CLINIC_OR_DEPARTMENT_OTHER): Payer: Medicare Other | Admitting: Hematology

## 2017-08-26 VITALS — BP 148/78 | HR 91 | Temp 98.0°F | Resp 20 | Ht 63.0 in | Wt 173.8 lb

## 2017-08-26 DIAGNOSIS — D649 Anemia, unspecified: Secondary | ICD-10-CM

## 2017-08-26 DIAGNOSIS — E1122 Type 2 diabetes mellitus with diabetic chronic kidney disease: Secondary | ICD-10-CM

## 2017-08-26 DIAGNOSIS — Z803 Family history of malignant neoplasm of breast: Secondary | ICD-10-CM

## 2017-08-26 DIAGNOSIS — Z808 Family history of malignant neoplasm of other organs or systems: Secondary | ICD-10-CM | POA: Diagnosis not present

## 2017-08-26 DIAGNOSIS — I129 Hypertensive chronic kidney disease with stage 1 through stage 4 chronic kidney disease, or unspecified chronic kidney disease: Secondary | ICD-10-CM

## 2017-08-26 DIAGNOSIS — Z8051 Family history of malignant neoplasm of kidney: Secondary | ICD-10-CM

## 2017-08-26 DIAGNOSIS — R768 Other specified abnormal immunological findings in serum: Secondary | ICD-10-CM | POA: Diagnosis not present

## 2017-08-26 LAB — IRON AND TIBC
%SAT: 16 % — ABNORMAL LOW (ref 21–57)
Iron: 63 ug/dL (ref 41–142)
TIBC: 401 ug/dL (ref 236–444)
UIBC: 338 ug/dL (ref 120–384)

## 2017-08-26 LAB — CBC & DIFF AND RETIC
BASO%: 0.4 % (ref 0.0–2.0)
Basophils Absolute: 0 10*3/uL (ref 0.0–0.1)
EOS%: 2 % (ref 0.0–7.0)
Eosinophils Absolute: 0.2 10*3/uL (ref 0.0–0.5)
HCT: 34.7 % — ABNORMAL LOW (ref 34.8–46.6)
HGB: 11.7 g/dL (ref 11.6–15.9)
IMMATURE RETIC FRACT: 10.2 % — AB (ref 1.60–10.00)
LYMPH%: 23.6 % (ref 14.0–49.7)
MCH: 29.4 pg (ref 25.1–34.0)
MCHC: 33.7 g/dL (ref 31.5–36.0)
MCV: 87.2 fL (ref 79.5–101.0)
MONO#: 0.4 10*3/uL (ref 0.1–0.9)
MONO%: 4.6 % (ref 0.0–14.0)
NEUT%: 69.4 % (ref 38.4–76.8)
NEUTROS ABS: 5.2 10*3/uL (ref 1.5–6.5)
PLATELETS: 180 10*3/uL (ref 145–400)
RBC: 3.98 10*6/uL (ref 3.70–5.45)
RDW: 14.8 % — ABNORMAL HIGH (ref 11.2–14.5)
Retic %: 1.78 % (ref 0.70–2.10)
Retic Ct Abs: 70.84 10*3/uL (ref 33.70–90.70)
WBC: 7.5 10*3/uL (ref 3.9–10.3)
lymph#: 1.8 10*3/uL (ref 0.9–3.3)

## 2017-08-26 LAB — FERRITIN: FERRITIN: 22 ng/mL (ref 9–269)

## 2017-08-26 LAB — LACTATE DEHYDROGENASE: LDH: 184 U/L (ref 125–245)

## 2017-08-26 NOTE — Telephone Encounter (Signed)
Scheduled appt per 11/27 los - Gave patient AVS and calender per los.  

## 2017-08-26 NOTE — Progress Notes (Signed)
[place]  [phone] [phone] [place] consult Note   Patient Care Team: Wenda Low, MD as PCP - General (Internal Medicine) Wenda Low, MD (Internal Medicine) 08/26/2017  REFERRAL PHYSICIAN; Dr. Lorrene Reid   CHIEF COMPLAINTS/PURPOSE OF CONSULTATION:  Abnormal Kappa Light Chain Levels   HISTORY OF PRESENTING ILLNESS: 08/26/17  Tina Briggs 71 y.o. female is here because of her abnormal Kappa Light Chain levels. She was referred by nephrologist, Dr. Jamal Maes from [facility]. She was referred to the nephrologist by her PCP Dr. Buddy Duty. She presents to the clinic today accompanied by her husband.   In the past, she was diagnosed with CKD due to her DM. She has had DM for 14 years. Her last Hb1c was 7.0. She denies any eye issues. She has mild anemia and has not need a blood transfusion. She has taken iron which has previously helped her levels. She has biliary stent placement due to stones. Maternal aunt had breast cancer in her 59's. Her maternal grandmother had kidney cancer. Her paternal grandmother had bone cancer. She has retired from owning a [employer] and previously she was an Garment/textile technologist.   Today she notes she did a 24 hour urine test but it is not the same day as her 07/14/17 labs. She is overall doing well but anxious about her possible diagnosis.    She was found to have abnormal CBC from 07/14/2017 with Free Kappa Light Chain Serum: 71.3, 24.9, 2.86 She denies recent chest pain on exertion, shortness of breath on minimal exertion, pre-syncopal episodes, or palpitations. She had not noticed any recent bleeding such as epistaxis, hematuria or hematochezia The patient denies over the counter NSAID ingestion. She is not on antiplatelets agents. Her last ERCP was 2016 She had no prior history or diagnosis of cancer. Her age appropriate screening programs are up-to-date. She denies any pica and eats a  variety of diet. She never donated blood or received blood transfusion The patient was previously prescribed oral iron supplements, but no longer takes it.    MEDICAL HISTORY:  Past Medical History:  Diagnosis Date  . Anemia   . CATARACT, RIGHT EYE   . Depression   . DM   . GERD   . HYPERLIPIDEMIA-MIXED   . HYPERTENSION, UNSPECIFIED   . OBESITY   . PONV (postoperative nausea and vomiting)    past history only    SURGICAL HISTORY: Past Surgical History:  Procedure Laterality Date  . ABDOMINAL HYSTERECTOMY    . APPENDECTOMY    . BACK SURGERY     lumbar '09"microdiscectomy"  . CHOLECYSTECTOMY     open  . COLONOSCOPY WITH PROPOFOL N/A 11/22/2014   Procedure: COLONOSCOPY WITH PROPOFOL;  Surgeon: Garlan Fair, MD;  Location: [facility];  Service: Endoscopy;  Laterality: N/A;  . ERCP N/A 05/26/2015   Procedure: ENDOSCOPIC RETROGRADE CHOLANGIOPANCREATOGRAPHY (ERCP)   (DOING CASE IN MAIN OR);  Surgeon: Teena Irani, MD;  Location: [facility];  Service: Gastroenterology;  Laterality: N/A;  . ERCP N/A 06/06/2015   Procedure: ENDOSCOPIC RETROGRADE CHOLANGIOPANCREATOGRAPHY (ERCP);  Surgeon: Clarene Essex, MD;  Location: [facility];  Service: Endoscopy;  Laterality: N/A;  . ESOPHAGOGASTRODUODENOSCOPY (EGD) WITH PROPOFOL N/A 11/22/2014   Procedure: ESOPHAGOGASTRODUODENOSCOPY (EGD) WITH PROPOFOL;  Surgeon: Garlan Fair, MD;  Location: [facility];  Service: Endoscopy;  Laterality: N/A;  . TUBAL LIGATION      SOCIAL HISTORY: Social History   Socioeconomic History  . Marital status: Married  Spouse name: Not on file  . Number of children: Not on file  . Years of education: Not on file  . Highest education level: Not on file  Social Needs  . Financial resource strain: Not on file  . Food insecurity - worry: Not on file  . Food insecurity - inability: Not on file  . Transportation needs - medical: Not on file  . Transportation needs - non-medical: Not on file  Occupational  History  . Not on file  Tobacco Use  . Smoking status: Never Smoker  . Smokeless tobacco: Never Used  Substance and Sexual Activity  . Alcohol use: No  . Drug use: No  . Sexual activity: Not on file  Other Topics Concern  . Not on file  Social History Narrative  . Not on file    FAMILY HISTORY: Family History  Problem Relation Age of Onset  . Cancer Maternal Aunt        breast cancer  . Cancer Maternal Grandmother        kidney cancer   . Cancer Paternal Grandmother        bone cancer     ALLERGIES:  is allergic to codeine; hydrocodone; tetracyclines & related; azithromycin; erythromycin; morphine and related; penicillins; and septra [sulfamethoxazole-trimethoprim].  MEDICATIONS:  Current Outpatient Medications  Medication Sig Dispense Refill  . acetaminophen (TYLENOL) 500 MG tablet Take 1,000 mg by mouth daily as needed for headache.    Marland Kitchen aspirin EC 81 MG tablet Take 81 mg by mouth daily.    . Cholecalciferol (VITAMIN D PO) Take 1 tablet by mouth every morning.    Marland Kitchen CRESTOR 40 MG tablet TAKE 1 TABLET AT BEDTIME 90 tablet 1  . fluticasone (FLONASE) 50 MCG/ACT nasal spray Place 2 sprays into both nostrils daily as needed for allergies.     Marland Kitchen glimepiride (AMARYL) 2 MG tablet Take 1 mg by mouth 2 (two) times daily.     . iron polysaccharides (NIFEREX) 150 MG capsule Take 150 mg by mouth every morning.    Marland Kitchen losartan (COZAAR) 100 MG tablet Take 1 tablet by mouth daily.    . metFORMIN (GLUCOPHAGE-XR) 500 MG 24 hr tablet Take 1,000 mg by mouth daily.    . ranitidine (ZANTAC) 150 MG tablet Take 150 mg by mouth 2 (two) times daily.    Marland Kitchen venlafaxine XR (EFFEXOR-XR) 75 MG 24 hr capsule Take 75 mg by mouth every morning.     Marland Kitchen VICTOZA 18 MG/3ML SOPN Inject 1.2 mg as directed at bedtime.     . vitamin B-12 (CYANOCOBALAMIN) 1000 MCG tablet Take 1,000 mcg by mouth 3 (three) times a week. Take on Sundays, Wednesdays and Fridays.    . ondansetron (ZOFRAN ODT) 4 MG disintegrating tablet  Take 1 tablet (4 mg total) by mouth every 8 (eight) hours as needed for nausea or vomiting. (Patient not taking: Reported on 08/26/2017) 20 tablet 0   No current facility-administered medications for this visit.     REVIEW OF SYSTEMS:   Constitutional: Denies fevers, chills or abnormal night sweats Eyes: Denies blurriness of vision, double vision or watery eyes Ears, nose, mouth, throat, and face: Denies mucositis or sore throat Respiratory: Denies cough, dyspnea or wheezes Cardiovascular: Denies palpitation, chest discomfort or lower extremity swelling Gastrointestinal:  Denies nausea, heartburn or change in bowel habits Skin: Denies abnormal skin rashes Lymphatics: Denies new lymphadenopathy or easy bruising Neurological:Denies numbness, tingling or new weaknesses Behavioral/Psych: Mood is stable, no new changes (+) anxious  All other systems were reviewed with the patient and are negative.  PHYSICAL EXAMINATION: ECOG PERFORMANCE STATUS: 0 - Asymptomatic  Vitals:   08/26/17 1125  BP: (!) 148/78  Pulse: 91  Resp: 20  Temp: 98 F (36.7 C)  SpO2: 100%   Filed Weights   08/26/17 1125  Weight: 173 lb 12.8 oz (78.8 kg)    GENERAL:alert, no distress and comfortable SKIN: skin color, texture, turgor are normal, no rashes or significant lesions EYES: normal, conjunctiva are pink and non-injected, sclera clear OROPHARYNX:no exudate, no erythema and lips, buccal mucosa, and tongue normal  NECK: supple, thyroid normal size, non-tender, without nodularity LYMPH:  no palpable lymphadenopathy in the cervical, axillary or inguinal LUNGS: clear to auscultation and percussion with normal breathing effort HEART: regular rate & rhythm and no murmurs and no lower extremity edema ABDOMEN:abdomen soft, non-tender and normal bowel sounds Musculoskeletal:no cyanosis of digits and no clubbing  PSYCH: alert & oriented x 3 with fluent speech NEURO: no focal motor/sensory deficits  LABORATORY  DATA:  I have reviewed the data as listed CBC Latest Ref Rng & Units 08/26/2017 06/07/2015 06/06/2015  WBC 3.9 - 10.3 10e3/uL 7.5 7.6 5.7  Hemoglobin 11.6 - 15.9 g/dL 11.7 9.5(L) 9.1(L)  Hematocrit 34.8 - 46.6 % 34.7(L) 28.9(L) 28.7(L)  Platelets 145 - 400 10e3/uL 180 221 204    CMP Latest Ref Rng & Units 06/07/2015 06/06/2015 06/05/2015  Glucose 65 - 99 mg/dL 112(H) 114(H) 133(H)  BUN 6 - 20 mg/dL _0 Creatinine 0.44 - 1.00 mg/dL 1.01(H) 1.06(H) 1.17(H)  Sodium 135 - 145 mmol/L 142 142 141  Potassium 3.5 - 5.1 mmol/L 3.6 3.7 4.0  Chloride 101 - 111 mmol/L 115(H) 114(H) 115(H)  CO2 22 - 32 mmol/L 20(L) 21(L) 22  Calcium 8.9 - 10.3 mg/dL 8.4(L) 8.1(L) 8.1(L)  Total Protein 6.5 - 8.1 g/dL 5.9(L) 5.5(L) -  Total Bilirubin 0.3 - 1.2 mg/dL 0.8 1.3(H) -  Alkaline Phos 38 - 126 U/L 284(H) 312(H) -  AST 15 - 41 U/L 32 143(H) -  ALT 14 - 54 U/L 82(H) 117(H) -   07/14/17 Labs  Free Kappa Light Chain Serum: 71.3, 24.9, 2.86 Random UPEP: M-protein (-), IFE normal immunofixation pattern   RADIOGRAPHIC STUDIES: I have personally reviewed the radiological images as listed and agreed with the findings in the report. No results found.  ASSESSMENT & PLAN:  EGYPT WELCOME is a 71 y.o. caucasian female with a history of Stage III CKD, osteoarthritis, Fatty liver, GERD, HTN, Neuropathy, Anxiety and depression, Anemia, and DM.    1. Abnormal Kappa levels, rule out myeloma -I reviewed her outside labs and explained the findings to patient in detail. It showed slightly elevated kappa lightChain, normal lambda light chain, and increased ratio.  Immunofixation was normal, No M-protein. -I will try to obtain her 24-hour urine test result -We discussed Multiple Myloma and Kappa Light Chain Disease. I explained the elevation in kappa light chain can be non-specific.  She is asymptomatic, no bone pain or tenderness.  Her lab test does not show significant anemia, hypercalcemia, her chronic renal disease is  probably related to her diabetes and hypertension.  High suspicion for multiple myeloma. -I will repeat SPEP with IFE, and serum free light chain level today  -I will also do a bone survey to rule out any lytic lesions in the bone. If her abnormal I would suggest s bone marrow biopsy.  -I discussed cancer screenings and she is up to  date.  -F/u open.  As above test are negative, she does not need to follow-up with me.   2. Mild anemia  -Based on 2016 labs she had mild anemia.  -She was previously on oral iron for iron deficient anemia. -I will check iron levels today, if low she will restart oral iron.    2. Stage III Kidney Disease, secondary to DM and HTN  -managed by her Nephrologist, Dr. Lorrene Reid.  -I suggest she avoid NSAIDs and CT Contrast. I encouraged her to drink plenty of water and to control her DM and HTN. I recommend she has a healthy diet, exercise and stay positive.     PLAN:  -Lab today -Bone Survey in one week -I will call her to discuss the above test results.  If negative, she does not need to follow-up with me in the future.      No problem-specific Assessment & Plan notes found for this encounter.    All questions were answered. The patient knows to call the clinic with any problems, questions or concerns. I spent 30 minutes counseling the patient face to face. The total time spent in the appointment was 40 minutes and more than 50% was on counseling.     Truitt Merle, MD 08/26/17   This document serves as a record of services personally performed by Truitt Merle, MD. It was created on her behalf by Joslyn Devon, a trained medical scribe. The creation of this record is based on the scribe's personal observations and the provider's statements to them.   I have reviewed the above documentation for accuracy and completeness, and I agree with the above.

## 2017-08-27 LAB — KAPPA/LAMBDA LIGHT CHAINS
IG KAPPA FREE LIGHT CHAIN: 58.7 mg/L — AB (ref 3.3–19.4)
IG LAMBDA FREE LIGHT CHAIN: 22.5 mg/L (ref 5.7–26.3)
KAPPA/LAMBDA FLC RATIO: 2.61 — AB (ref 0.26–1.65)

## 2017-08-28 LAB — MULTIPLE MYELOMA PANEL, SERUM
ALBUMIN SERPL ELPH-MCNC: 3.5 g/dL (ref 2.9–4.4)
ALPHA 1: 0.2 g/dL (ref 0.0–0.4)
Albumin/Glob SerPl: 1.2 (ref 0.7–1.7)
Alpha2 Glob SerPl Elph-Mcnc: 1 g/dL (ref 0.4–1.0)
B-Globulin SerPl Elph-Mcnc: 1.1 g/dL (ref 0.7–1.3)
GAMMA GLOB SERPL ELPH-MCNC: 0.9 g/dL (ref 0.4–1.8)
Globulin, Total: 3.1 g/dL (ref 2.2–3.9)
IGA/IMMUNOGLOBULIN A, SERUM: 140 mg/dL (ref 87–352)
IGM (IMMUNOGLOBIN M), SRM: 134 mg/dL (ref 26–217)
IgG, Qn, Serum: 798 mg/dL (ref 700–1600)
TOTAL PROTEIN: 6.6 g/dL (ref 6.0–8.5)

## 2017-09-03 ENCOUNTER — Ambulatory Visit (HOSPITAL_COMMUNITY)
Admission: RE | Admit: 2017-09-03 | Discharge: 2017-09-03 | Disposition: A | Discharge status: Home / Self Care | Payer: Medicare Other | Source: Ambulatory Visit | Attending: Hematology | Admitting: Hematology

## 2017-09-03 DIAGNOSIS — M545 Low back pain: Secondary | ICD-10-CM | POA: Diagnosis not present

## 2017-09-03 DIAGNOSIS — R768 Other specified abnormal immunological findings in serum: Secondary | ICD-10-CM

## 2017-09-04 DIAGNOSIS — R809 Proteinuria, unspecified: Secondary | ICD-10-CM | POA: Diagnosis not present

## 2017-09-04 DIAGNOSIS — I129 Hypertensive chronic kidney disease with stage 1 through stage 4 chronic kidney disease, or unspecified chronic kidney disease: Secondary | ICD-10-CM | POA: Diagnosis not present

## 2017-09-04 DIAGNOSIS — D631 Anemia in chronic kidney disease: Secondary | ICD-10-CM | POA: Diagnosis not present

## 2017-09-04 DIAGNOSIS — E1122 Type 2 diabetes mellitus with diabetic chronic kidney disease: Secondary | ICD-10-CM | POA: Diagnosis not present

## 2017-09-04 DIAGNOSIS — R3129 Other microscopic hematuria: Secondary | ICD-10-CM | POA: Diagnosis not present

## 2017-09-04 DIAGNOSIS — N183 Chronic kidney disease, stage 3 (moderate): Secondary | ICD-10-CM | POA: Diagnosis not present

## 2017-09-04 DIAGNOSIS — M5416 Radiculopathy, lumbar region: Secondary | ICD-10-CM | POA: Diagnosis not present

## 2017-09-09 ENCOUNTER — Other Ambulatory Visit: Payer: Self-pay | Admitting: Internal Medicine

## 2017-09-09 DIAGNOSIS — M5416 Radiculopathy, lumbar region: Secondary | ICD-10-CM

## 2017-09-09 DIAGNOSIS — M519 Unspecified thoracic, thoracolumbar and lumbosacral intervertebral disc disorder: Secondary | ICD-10-CM

## 2017-09-10 ENCOUNTER — Telehealth: Payer: Self-pay | Admitting: Emergency Medicine

## 2017-09-10 NOTE — Telephone Encounter (Signed)
Called patient per Dr.Feng to let her know that her bone survey was negative. Pt verbalized understanding of this.

## 2017-09-15 ENCOUNTER — Telehealth: Payer: Self-pay | Admitting: *Deleted

## 2017-09-15 NOTE — Telephone Encounter (Signed)
-----  Message from Truitt Merle, MD sent at 09/14/2017 12:56 PM EST ----- Please let her know that I have reviewed her lab results, no evidence of multiple myeloma, no concerns. She does not need to see me in future. Her iron level is still slightly low, continue oral iron and f/u with PCP. Thanks.  Truitt Merle  09/14/2017

## 2017-09-15 NOTE — Telephone Encounter (Signed)
Called pt and left message requesting a call back from pt to discuss lab results as per Dr. Ernestina Penna instructions.

## 2017-09-19 ENCOUNTER — Ambulatory Visit
Admission: RE | Admit: 2017-09-19 | Discharge: 2017-09-19 | Disposition: A | Discharge status: Home / Self Care | Payer: Medicare Other | Source: Ambulatory Visit | Attending: Internal Medicine | Admitting: Internal Medicine

## 2017-09-19 DIAGNOSIS — M5416 Radiculopathy, lumbar region: Secondary | ICD-10-CM

## 2017-09-19 DIAGNOSIS — M48061 Spinal stenosis, lumbar region without neurogenic claudication: Secondary | ICD-10-CM | POA: Diagnosis not present

## 2017-09-19 DIAGNOSIS — M519 Unspecified thoracic, thoracolumbar and lumbosacral intervertebral disc disorder: Secondary | ICD-10-CM

## 2017-09-19 DIAGNOSIS — M5126 Other intervertebral disc displacement, lumbar region: Secondary | ICD-10-CM | POA: Diagnosis not present

## 2017-09-19 MED ORDER — GADOBENATE DIMEGLUMINE 529 MG/ML IV SOLN
7.0000 mL | Freq: Once | INTRAVENOUS | Status: AC | PRN
  Administered 2017-09-19: 7 mL via INTRAVENOUS

## 2017-09-30 DIAGNOSIS — N059 Unspecified nephritic syndrome with unspecified morphologic changes: Secondary | ICD-10-CM

## 2017-09-30 HISTORY — DX: Unspecified nephritic syndrome with unspecified morphologic changes: N05.9

## 2017-10-14 DIAGNOSIS — Z6828 Body mass index (BMI) 28.0-28.9, adult: Secondary | ICD-10-CM | POA: Diagnosis not present

## 2017-10-14 DIAGNOSIS — I10 Essential (primary) hypertension: Secondary | ICD-10-CM | POA: Diagnosis not present

## 2017-10-14 DIAGNOSIS — M5416 Radiculopathy, lumbar region: Secondary | ICD-10-CM | POA: Diagnosis not present

## 2017-10-15 DIAGNOSIS — K5792 Diverticulitis of intestine, part unspecified, without perforation or abscess without bleeding: Secondary | ICD-10-CM | POA: Diagnosis not present

## 2017-10-17 ENCOUNTER — Other Ambulatory Visit (HOSPITAL_COMMUNITY): Payer: Self-pay | Admitting: Nephrology

## 2017-10-17 DIAGNOSIS — N189 Chronic kidney disease, unspecified: Secondary | ICD-10-CM

## 2017-10-17 DIAGNOSIS — D631 Anemia in chronic kidney disease: Secondary | ICD-10-CM

## 2017-10-17 DIAGNOSIS — N183 Chronic kidney disease, stage 3 unspecified: Secondary | ICD-10-CM

## 2017-10-17 DIAGNOSIS — R3129 Other microscopic hematuria: Secondary | ICD-10-CM

## 2017-10-17 DIAGNOSIS — R809 Proteinuria, unspecified: Secondary | ICD-10-CM

## 2017-10-17 DIAGNOSIS — E1129 Type 2 diabetes mellitus with other diabetic kidney complication: Secondary | ICD-10-CM

## 2017-10-17 DIAGNOSIS — I129 Hypertensive chronic kidney disease with stage 1 through stage 4 chronic kidney disease, or unspecified chronic kidney disease: Secondary | ICD-10-CM

## 2017-10-22 ENCOUNTER — Other Ambulatory Visit: Payer: Self-pay | Admitting: Internal Medicine

## 2017-10-22 DIAGNOSIS — R109 Unspecified abdominal pain: Secondary | ICD-10-CM

## 2017-10-28 ENCOUNTER — Ambulatory Visit
Admission: RE | Admit: 2017-10-28 | Discharge: 2017-10-28 | Disposition: A | Discharge status: Home / Self Care | Payer: Medicare Other | Source: Ambulatory Visit | Attending: Internal Medicine | Admitting: Internal Medicine

## 2017-10-28 ENCOUNTER — Other Ambulatory Visit: Payer: Self-pay | Admitting: Internal Medicine

## 2017-10-28 DIAGNOSIS — K5792 Diverticulitis of intestine, part unspecified, without perforation or abscess without bleeding: Secondary | ICD-10-CM | POA: Diagnosis not present

## 2017-10-28 DIAGNOSIS — N189 Chronic kidney disease, unspecified: Secondary | ICD-10-CM | POA: Diagnosis not present

## 2017-10-28 DIAGNOSIS — K5732 Diverticulitis of large intestine without perforation or abscess without bleeding: Secondary | ICD-10-CM | POA: Diagnosis not present

## 2017-10-28 DIAGNOSIS — R109 Unspecified abdominal pain: Secondary | ICD-10-CM

## 2017-10-31 ENCOUNTER — Other Ambulatory Visit: Payer: Self-pay | Admitting: Radiology

## 2017-11-03 ENCOUNTER — Other Ambulatory Visit: Payer: Self-pay | Admitting: General Surgery

## 2017-11-03 DIAGNOSIS — M5416 Radiculopathy, lumbar region: Secondary | ICD-10-CM | POA: Diagnosis not present

## 2017-11-03 DIAGNOSIS — M545 Low back pain: Secondary | ICD-10-CM | POA: Diagnosis not present

## 2017-11-04 ENCOUNTER — Ambulatory Visit (HOSPITAL_COMMUNITY)
Admission: RE | Admit: 2017-11-04 | Discharge: 2017-11-04 | Disposition: A | Discharge status: Home / Self Care | Payer: Medicare Other | Source: Ambulatory Visit | Attending: Nephrology | Admitting: Nephrology

## 2017-11-04 ENCOUNTER — Encounter (HOSPITAL_COMMUNITY): Payer: Self-pay

## 2017-11-04 DIAGNOSIS — Z79899 Other long term (current) drug therapy: Secondary | ICD-10-CM | POA: Insufficient documentation

## 2017-11-04 DIAGNOSIS — Z881 Allergy status to other antibiotic agents status: Secondary | ICD-10-CM | POA: Diagnosis not present

## 2017-11-04 DIAGNOSIS — Z882 Allergy status to sulfonamides status: Secondary | ICD-10-CM | POA: Diagnosis not present

## 2017-11-04 DIAGNOSIS — Z7982 Long term (current) use of aspirin: Secondary | ICD-10-CM | POA: Diagnosis not present

## 2017-11-04 DIAGNOSIS — E1129 Type 2 diabetes mellitus with other diabetic kidney complication: Secondary | ICD-10-CM | POA: Diagnosis not present

## 2017-11-04 DIAGNOSIS — Z7984 Long term (current) use of oral hypoglycemic drugs: Secondary | ICD-10-CM | POA: Diagnosis not present

## 2017-11-04 DIAGNOSIS — K219 Gastro-esophageal reflux disease without esophagitis: Secondary | ICD-10-CM | POA: Diagnosis not present

## 2017-11-04 DIAGNOSIS — E785 Hyperlipidemia, unspecified: Secondary | ICD-10-CM | POA: Diagnosis not present

## 2017-11-04 DIAGNOSIS — E669 Obesity, unspecified: Secondary | ICD-10-CM | POA: Insufficient documentation

## 2017-11-04 DIAGNOSIS — Z885 Allergy status to narcotic agent status: Secondary | ICD-10-CM | POA: Insufficient documentation

## 2017-11-04 DIAGNOSIS — R809 Proteinuria, unspecified: Secondary | ICD-10-CM | POA: Diagnosis not present

## 2017-11-04 DIAGNOSIS — D631 Anemia in chronic kidney disease: Secondary | ICD-10-CM | POA: Diagnosis not present

## 2017-11-04 DIAGNOSIS — Z880 Allergy status to penicillin: Secondary | ICD-10-CM | POA: Insufficient documentation

## 2017-11-04 DIAGNOSIS — N183 Chronic kidney disease, stage 3 unspecified: Secondary | ICD-10-CM

## 2017-11-04 DIAGNOSIS — R3129 Other microscopic hematuria: Secondary | ICD-10-CM | POA: Diagnosis not present

## 2017-11-04 DIAGNOSIS — N189 Chronic kidney disease, unspecified: Secondary | ICD-10-CM

## 2017-11-04 DIAGNOSIS — I129 Hypertensive chronic kidney disease with stage 1 through stage 4 chronic kidney disease, or unspecified chronic kidney disease: Secondary | ICD-10-CM | POA: Diagnosis not present

## 2017-11-04 DIAGNOSIS — F329 Major depressive disorder, single episode, unspecified: Secondary | ICD-10-CM | POA: Diagnosis not present

## 2017-11-04 LAB — CBC
HCT: 36 % (ref 36.0–46.0)
Hemoglobin: 11.5 g/dL — ABNORMAL LOW (ref 12.0–15.0)
MCH: 28.5 pg (ref 26.0–34.0)
MCHC: 31.9 g/dL (ref 30.0–36.0)
MCV: 89.1 fL (ref 78.0–100.0)
Platelets: 210 10*3/uL (ref 150–400)
RBC: 4.04 MIL/uL (ref 3.87–5.11)
RDW: 14.1 % (ref 11.5–15.5)
WBC: 6.3 10*3/uL (ref 4.0–10.5)

## 2017-11-04 LAB — PROTIME-INR
INR: 1.04
Prothrombin Time: 13.5 seconds (ref 11.4–15.2)

## 2017-11-04 LAB — GLUCOSE, CAPILLARY
GLUCOSE-CAPILLARY: 127 mg/dL — AB (ref 65–99)
GLUCOSE-CAPILLARY: 104 mg/dL — AB (ref 65–99)

## 2017-11-04 MED ORDER — MIDAZOLAM HCL 2 MG/2ML IJ SOLN
INTRAMUSCULAR | Status: AC | PRN
  Administered 2017-11-04 (×2): 1 mg via INTRAVENOUS

## 2017-11-04 MED ORDER — MIDAZOLAM HCL 2 MG/2ML IJ SOLN
INTRAMUSCULAR | Status: AC
  Filled 2017-11-04: qty 2

## 2017-11-04 MED ORDER — LIDOCAINE HCL (PF) 1 % IJ SOLN
INTRAMUSCULAR | Status: AC
  Filled 2017-11-04: qty 30

## 2017-11-04 MED ORDER — FENTANYL CITRATE (PF) 100 MCG/2ML IJ SOLN
INTRAMUSCULAR | Status: AC | PRN
  Administered 2017-11-04 (×2): 25 ug via INTRAVENOUS
  Administered 2017-11-04: 50 ug via INTRAVENOUS

## 2017-11-04 MED ORDER — FENTANYL CITRATE (PF) 100 MCG/2ML IJ SOLN
INTRAMUSCULAR | Status: AC
  Filled 2017-11-04: qty 4

## 2017-11-04 MED ORDER — SODIUM CHLORIDE 0.9 % IV SOLN: INTRAVENOUS | Status: DC

## 2017-11-04 NOTE — Sedation Documentation (Signed)
Patient is resting comfortably. 

## 2017-11-04 NOTE — Procedures (Signed)
Interventional Radiology Procedure Note  Procedure: US guided renal biopsy  Complications: None  Estimated Blood Loss: < 10 mL  Findings: 16 G core biopsy x 2 at level of LP cortex of left kidney.   Plan for 4 hour bedrest.  Eulas Post T. Kathlene Cote, M.D Pager:  [phone]

## 2017-11-04 NOTE — Discharge Instructions (Addendum)

## 2017-11-04 NOTE — H&P (Signed)
Chief Complaint: Patient was seen in consultation today for random renal biopsy at the request of [place]  Referring Physician(s): Roy  Supervising Physician: Aletta Edouard  Patient Status: [facility] - Out-pt  History of Present Illness: Tina Briggs is a 72 y.o. female   Change in kidney functions PMD referred pt to Dr Lorrene Reid Has been followed since 06/2017 Now for random renal biopsy  Proteinuria; microscopic hematuria Chronic renal disease 3   Past Medical History:  Diagnosis Date  . Anemia   . CATARACT, RIGHT EYE   . Depression   . DM   . GERD   . HYPERLIPIDEMIA-MIXED   . HYPERTENSION, UNSPECIFIED   . OBESITY   . PONV (postoperative nausea and vomiting)    past history only    Past Surgical History:  Procedure Laterality Date  . ABDOMINAL HYSTERECTOMY    . APPENDECTOMY    . BACK SURGERY     lumbar '09"microdiscectomy"  . CHOLECYSTECTOMY     open  . COLONOSCOPY WITH PROPOFOL N/A 11/22/2014   Procedure: COLONOSCOPY WITH PROPOFOL;  Surgeon: Garlan Fair, MD;  Location: [facility];  Service: Endoscopy;  Laterality: N/A;  . ERCP N/A 05/26/2015   Procedure: ENDOSCOPIC RETROGRADE CHOLANGIOPANCREATOGRAPHY (ERCP)   (DOING CASE IN MAIN OR);  Surgeon: Teena Irani, MD;  Location: [facility];  Service: Gastroenterology;  Laterality: N/A;  . ERCP N/A 06/06/2015   Procedure: ENDOSCOPIC RETROGRADE CHOLANGIOPANCREATOGRAPHY (ERCP);  Surgeon: Clarene Essex, MD;  Location: [facility];  Service: Endoscopy;  Laterality: N/A;  . ESOPHAGOGASTRODUODENOSCOPY (EGD) WITH PROPOFOL N/A 11/22/2014   Procedure: ESOPHAGOGASTRODUODENOSCOPY (EGD) WITH PROPOFOL;  Surgeon: Garlan Fair, MD;  Location: [facility];  Service: Endoscopy;  Laterality: N/A;  . TUBAL LIGATION      Allergies: Codeine; Hydrocodone; Tetracyclines & related; Azithromycin; Erythromycin; Morphine and related; Penicillins; and Septra [sulfamethoxazole-trimethoprim]  Medications: Prior  to Admission medications   Medication Sig Start Date End Date Taking? Authorizing Provider  acetaminophen (TYLENOL) 500 MG tablet Take 1,000 mg by mouth daily as needed for headache.   Yes [provider]  aspirin EC 81 MG tablet Take 81 mg by mouth daily.   Yes [provider]  Cholecalciferol (VITAMIN D PO) Take 1 tablet by mouth every morning.   Yes [provider]  CRESTOR 40 MG tablet TAKE 1 TABLET AT BEDTIME 04/19/13  Yes Wall, Marijo Conception, MD  fluticasone (FLONASE) 50 MCG/ACT nasal spray Place 2 sprays into both nostrils daily as needed for allergies.    Yes [provider]  glimepiride (AMARYL) 2 MG tablet Take 2 mg by mouth daily with breakfast.    Yes [provider]  methocarbamol (ROBAXIN) 750 MG tablet Take 750 mg by mouth 3 (three) times daily as needed for muscle spasms.   Yes [provider]  ranitidine (ZANTAC) 150 MG tablet Take 150 mg by mouth 2 (two) times daily.   Yes [provider]  traMADol (ULTRAM) 50 MG tablet Take 50 mg by mouth 3 (three) times daily as needed.   Yes [provider]  venlafaxine XR (EFFEXOR-XR) 75 MG 24 hr capsule Take 75 mg by mouth every morning.    Yes [provider]  VICTOZA 18 MG/3ML SOPN Inject 1.2 mg as directed at bedtime.  10/03/14  Yes [provider]  vitamin B-12 (CYANOCOBALAMIN) 1000 MCG tablet Take 1,000 mcg by mouth 3 (three) times a week. Take on Sundays, Wednesdays and Fridays.   Yes [provider]  losartan (COZAAR) 100  MG tablet Take 1 tablet by mouth daily. 04/19/15   [provider]  metFORMIN (GLUCOPHAGE-XR) 500 MG 24 hr tablet Take 1,000 mg by mouth daily.    [provider]     Family History  Problem Relation Age of Onset  . Cancer Maternal Aunt        breast cancer  . Cancer Maternal Grandmother        kidney cancer   . Cancer Paternal Grandmother        bone cancer     Social History   Socioeconomic History   . Marital status: Married    Spouse name: None  . Number of children: None  . Years of education: None  . Highest education level: None  Social Needs  . Financial resource strain: None  . Food insecurity - worry: None  . Food insecurity - inability: None  . Transportation needs - medical: None  . Transportation needs - non-medical: None  Occupational History  . None  Tobacco Use  . Smoking status: Never Smoker  . Smokeless tobacco: Never Used  Substance and Sexual Activity  . Alcohol use: No  . Drug use: No  . Sexual activity: None  Other Topics Concern  . None  Social History Narrative  . None   Review of Systems: A 12 point ROS discussed and pertinent positives are indicated in the HPI above.  All other systems are negative.  Review of Systems  Constitutional: Negative for activity change, fatigue and fever.  Respiratory: Negative for cough and shortness of breath.   Cardiovascular: Negative for chest pain.  Gastrointestinal: Negative for abdominal pain.  Musculoskeletal: Negative for back pain.  Neurological: Negative for weakness.  Psychiatric/Behavioral: Negative for behavioral problems and confusion.    Vital Signs: BP 138/77   Pulse 86   Temp 97.9 F (36.6 C) (Oral)   Resp 16   Ht 5\' 3"  (1.6 m)   Wt 158 lb (71.7 kg)   SpO2 100%   BMI 27.99 kg/m   Physical Exam  Constitutional: She is oriented to person, place, and time.  Cardiovascular: Normal rate, regular rhythm and normal heart sounds.  Pulmonary/Chest: Effort normal and breath sounds normal. She has no wheezes.  Abdominal: Soft. Bowel sounds are normal.  Musculoskeletal: Normal range of motion.  Neurological: She is alert and oriented to person, place, and time.  Skin: Skin is warm and dry.  Psychiatric: She has a normal mood and affect. Her behavior is normal. Judgment and thought content normal.  Nursing note and vitals reviewed.   Imaging: Ct Abdomen Pelvis Wo Contrast  Result Date:  10/28/2017 CLINICAL DATA:  72 year old female with a history of diverticulitis EXAM: CT ABDOMEN AND PELVIS WITHOUT CONTRAST TECHNIQUE: Multidetector CT imaging of the abdomen and pelvis was performed following the standard protocol without IV contrast. COMPARISON:  06/02/2015 FINDINGS: Lower chest: No acute Hepatobiliary: Trace pneumobilia, unchanged from prior. Unremarkable appearance of the hepatic parenchyma. Surgical changes of cholecystectomy. Interval removal of biliary stent. Pancreas: Unremarkable pancreas. Spleen: Unremarkable spleen Adrenals/Urinary Tract: Unremarkable appearance of the adrenal glands. No evidence of hydronephrosis of the right or left kidney. No nephrolithiasis. Unremarkable course of the bilateral ureters. Unremarkable appearance of the urinary bladder. Stomach/Bowel: Hiatal hernia. No abnormally distended small bowel or colon. Normal appendix. Large formed stool burden of the cecum, ascending colon, transverse colon, descending colon, rectum. Colonic diverticular change of the descending colon, sigmoid colon, rectum. There are mild inflammatory changes of the colon wall in the  descending colon and sigmoid colon with no evidence of perforation or fluid collection. Vascular/Lymphatic: Minimal atherosclerotic change of the abdominal aorta. No aneurysm. No lymphadenopathy. Reproductive: Hysterectomy Other: Infiltration of the soft tissues of the left low abdomen/pelvic wall, potentially secondary to trauma or cellulitis. Fat containing umbilical hernia. Musculoskeletal: No acute displaced fracture degenerative changes of the visualized spine. IMPRESSION: Early diverticulitis of the descending colon/sigmoid colon without complicating features. These results will be called to the ordering clinician or representative by the Radiologist Assistant, and communication documented in the PACS or zVision Dashboard. Large formed stool burden Hiatal hernia. Aortic Atherosclerosis (ICD10-I70.0).  Electronically Signed   By: Corrie Mckusick D.O.   On: 10/28/2017 15:01    Labs:  CBC: Recent Labs    08/26/17 1255  WBC 7.5  HGB 11.7  HCT 34.7*  PLT 180    COAGS: No results for input(s): INR, APTT in the last 8760 hours.  BMP: No results for input(s): NA, K, CL, CO2, GLUCOSE, BUN, CALCIUM, CREATININE, GFRNONAA, GFRAA in the last 8760 hours.  Invalid input(s): CMP  LIVER FUNCTION TESTS: Recent Labs    08/26/17 1255  PROT 6.6    TUMOR MARKERS: No results for input(s): AFPTM, CEA, CA199, CHROMGRNA in the last 8760 hours.  Assessment and Plan:  CKD 3; proteinuria; micro hematuria Now scheduled for renal biopsy Risks and benefits discussed with the patient including, but not limited to bleeding, infection, damage to adjacent structures or low yield requiring additional tests.  All of the patient's questions were answered, patient is agreeable to proceed. Consent signed and in chart.   Thank you for this interesting consult.  I greatly enjoyed meeting SHERRIAN NUNNELLEY and look forward to participating in their care.  A copy of this report was sent to the requesting provider on this date.  Electronically Signed: Lavonia Drafts, PA-C 11/04/2017, 6:48 AM   I spent a total of  30 Minutes   in face to face in clinical consultation, greater than 50% of which was counseling/coordinating care for random renal bx

## 2017-11-10 DIAGNOSIS — M545 Low back pain: Secondary | ICD-10-CM | POA: Diagnosis not present

## 2017-11-10 DIAGNOSIS — M5416 Radiculopathy, lumbar region: Secondary | ICD-10-CM | POA: Diagnosis not present

## 2017-11-13 DIAGNOSIS — M5416 Radiculopathy, lumbar region: Secondary | ICD-10-CM | POA: Diagnosis not present

## 2017-11-13 DIAGNOSIS — N189 Chronic kidney disease, unspecified: Secondary | ICD-10-CM | POA: Diagnosis not present

## 2017-11-13 DIAGNOSIS — M545 Low back pain: Secondary | ICD-10-CM | POA: Diagnosis not present

## 2017-11-17 ENCOUNTER — Encounter (HOSPITAL_COMMUNITY): Payer: Self-pay

## 2017-11-18 DIAGNOSIS — I129 Hypertensive chronic kidney disease with stage 1 through stage 4 chronic kidney disease, or unspecified chronic kidney disease: Secondary | ICD-10-CM | POA: Diagnosis not present

## 2017-11-18 DIAGNOSIS — N183 Chronic kidney disease, stage 3 (moderate): Secondary | ICD-10-CM | POA: Diagnosis not present

## 2017-11-18 DIAGNOSIS — D631 Anemia in chronic kidney disease: Secondary | ICD-10-CM | POA: Diagnosis not present

## 2017-11-18 DIAGNOSIS — E1122 Type 2 diabetes mellitus with diabetic chronic kidney disease: Secondary | ICD-10-CM | POA: Diagnosis not present

## 2017-11-18 DIAGNOSIS — M4800 Spinal stenosis, site unspecified: Secondary | ICD-10-CM | POA: Diagnosis not present

## 2017-11-18 DIAGNOSIS — N052 Unspecified nephritic syndrome with diffuse membranous glomerulonephritis: Secondary | ICD-10-CM | POA: Diagnosis not present

## 2017-11-20 DIAGNOSIS — E1165 Type 2 diabetes mellitus with hyperglycemia: Secondary | ICD-10-CM | POA: Diagnosis not present

## 2017-11-20 DIAGNOSIS — I10 Essential (primary) hypertension: Secondary | ICD-10-CM | POA: Diagnosis not present

## 2017-11-20 DIAGNOSIS — E782 Mixed hyperlipidemia: Secondary | ICD-10-CM | POA: Diagnosis not present

## 2017-11-20 DIAGNOSIS — M5416 Radiculopathy, lumbar region: Secondary | ICD-10-CM | POA: Diagnosis not present

## 2017-11-20 DIAGNOSIS — M545 Low back pain: Secondary | ICD-10-CM | POA: Diagnosis not present

## 2017-11-20 DIAGNOSIS — Z1389 Encounter for screening for other disorder: Secondary | ICD-10-CM | POA: Diagnosis not present

## 2017-11-20 DIAGNOSIS — E1122 Type 2 diabetes mellitus with diabetic chronic kidney disease: Secondary | ICD-10-CM | POA: Diagnosis not present

## 2017-11-20 DIAGNOSIS — N183 Chronic kidney disease, stage 3 (moderate): Secondary | ICD-10-CM | POA: Diagnosis not present

## 2017-11-28 DIAGNOSIS — E1165 Type 2 diabetes mellitus with hyperglycemia: Secondary | ICD-10-CM | POA: Diagnosis not present

## 2017-11-28 DIAGNOSIS — Z79899 Other long term (current) drug therapy: Secondary | ICD-10-CM | POA: Diagnosis not present

## 2017-12-05 DIAGNOSIS — Z5181 Encounter for therapeutic drug level monitoring: Secondary | ICD-10-CM | POA: Diagnosis not present

## 2017-12-05 DIAGNOSIS — Z794 Long term (current) use of insulin: Secondary | ICD-10-CM | POA: Diagnosis not present

## 2017-12-05 DIAGNOSIS — E1165 Type 2 diabetes mellitus with hyperglycemia: Secondary | ICD-10-CM | POA: Diagnosis not present

## 2017-12-07 DIAGNOSIS — Z794 Long term (current) use of insulin: Secondary | ICD-10-CM | POA: Diagnosis not present

## 2017-12-07 DIAGNOSIS — E1165 Type 2 diabetes mellitus with hyperglycemia: Secondary | ICD-10-CM | POA: Diagnosis not present

## 2017-12-07 DIAGNOSIS — Z5181 Encounter for therapeutic drug level monitoring: Secondary | ICD-10-CM | POA: Diagnosis not present

## 2017-12-08 DIAGNOSIS — M21372 Foot drop, left foot: Secondary | ICD-10-CM | POA: Diagnosis not present

## 2017-12-08 DIAGNOSIS — I10 Essential (primary) hypertension: Secondary | ICD-10-CM | POA: Diagnosis not present

## 2017-12-08 DIAGNOSIS — M21379 Foot drop, unspecified foot: Secondary | ICD-10-CM | POA: Insufficient documentation

## 2017-12-08 DIAGNOSIS — Z6828 Body mass index (BMI) 28.0-28.9, adult: Secondary | ICD-10-CM | POA: Diagnosis not present

## 2017-12-09 DIAGNOSIS — M5416 Radiculopathy, lumbar region: Secondary | ICD-10-CM | POA: Insufficient documentation

## 2017-12-09 DIAGNOSIS — G5732 Lesion of lateral popliteal nerve, left lower limb: Secondary | ICD-10-CM | POA: Insufficient documentation

## 2017-12-11 DIAGNOSIS — I10 Essential (primary) hypertension: Secondary | ICD-10-CM | POA: Diagnosis not present

## 2017-12-11 DIAGNOSIS — M21372 Foot drop, left foot: Secondary | ICD-10-CM | POA: Diagnosis not present

## 2017-12-11 DIAGNOSIS — Z6828 Body mass index (BMI) 28.0-28.9, adult: Secondary | ICD-10-CM | POA: Diagnosis not present

## 2017-12-15 DIAGNOSIS — I129 Hypertensive chronic kidney disease with stage 1 through stage 4 chronic kidney disease, or unspecified chronic kidney disease: Secondary | ICD-10-CM | POA: Diagnosis not present

## 2017-12-15 DIAGNOSIS — D631 Anemia in chronic kidney disease: Secondary | ICD-10-CM | POA: Diagnosis not present

## 2017-12-15 DIAGNOSIS — M4800 Spinal stenosis, site unspecified: Secondary | ICD-10-CM | POA: Diagnosis not present

## 2017-12-15 DIAGNOSIS — E1122 Type 2 diabetes mellitus with diabetic chronic kidney disease: Secondary | ICD-10-CM | POA: Diagnosis not present

## 2017-12-15 DIAGNOSIS — N052 Unspecified nephritic syndrome with diffuse membranous glomerulonephritis: Secondary | ICD-10-CM | POA: Diagnosis not present

## 2017-12-15 DIAGNOSIS — N183 Chronic kidney disease, stage 3 (moderate): Secondary | ICD-10-CM | POA: Diagnosis not present

## 2017-12-17 DIAGNOSIS — M21372 Foot drop, left foot: Secondary | ICD-10-CM | POA: Diagnosis not present

## 2017-12-17 DIAGNOSIS — M545 Low back pain: Secondary | ICD-10-CM | POA: Diagnosis not present

## 2017-12-18 ENCOUNTER — Other Ambulatory Visit: Payer: Self-pay | Admitting: Neurological Surgery

## 2017-12-18 DIAGNOSIS — Z6829 Body mass index (BMI) 29.0-29.9, adult: Secondary | ICD-10-CM | POA: Diagnosis not present

## 2017-12-18 DIAGNOSIS — M21372 Foot drop, left foot: Secondary | ICD-10-CM | POA: Diagnosis not present

## 2017-12-18 DIAGNOSIS — I10 Essential (primary) hypertension: Secondary | ICD-10-CM | POA: Diagnosis not present

## 2017-12-24 NOTE — Pre-Procedure Instructions (Signed)
Tina Briggs  12/24/2017      [facility] [address], [place] [address] Phone: [phone] Fax: [phone]    Your procedure is scheduled on April 3  Report to [facility] at 1145 A.M.  Call this number if you have problems the morning of surgery:  [phone]   Remember:  Do not eat food or drink liquids after midnight.  Continue all medications as directed by your physician except follow these medication instructions before surgery below   Take these medicines the morning of surgery with A SIP OF WATER  acetaminophen (TYLENOL)  amLODipine (NORVASC) fluticasone (FLONASE)  LORazepam (ATIVAN) pantoprazole (PROTONIX) venlafaxine XR (EFFEXOR-XR) predniSONE (DELTASONE)   7 days prior to surgery STOP taking any Aspirin(unless otherwise instructed by your surgeon), Aleve, Naproxen, Ibuprofen, Motrin, Advil, Goody's, BC's, all herbal medications, fish oil, and all vitamins  Follow your doctors instructions regarding your Aspirin.  If no instructions were given by your doctor, then you will need to call the prescribing office office to get instructions.     WHAT DO I DO ABOUT MY DIABETES MEDICATION?   Marland Kitchen Do not take oral diabetes medicines (pills) the morning of surgery.  . THE NIGHT BEFORE SURGERY, take ______1_____ units of __insulin degludec (TRESIBA FLEXTOUCH)_________insulin.       . If your CBG is greater than 220 mg/dL, you may take  of your sliding scale (correction) dose of insulin.  insulin aspart (NOVOLOG)   How to Manage Your Diabetes Before and After Surgery  Why is it important to control my blood sugar before and after surgery? . Improving blood sugar levels before and after surgery helps healing and can limit problems. . A way of improving blood sugar control is eating a healthy diet by: o  Eating less sugar and carbohydrates o  Increasing  activity/exercise o  Talking with your doctor about reaching your blood sugar goals . High blood sugars (greater than 180 mg/dL) can raise your risk of infections and slow your recovery, so you will need to focus on controlling your diabetes during the weeks before surgery. . Make sure that the doctor who takes care of your diabetes knows about your planned surgery including the date and location.  How do I manage my blood sugar before surgery? . Check your blood sugar at least 4 times a day, starting 2 days before surgery, to make sure that the level is not too high or low. o Check your blood sugar the morning of your surgery when you wake up and every 2 hours until you get to the Short Stay unit. . If your blood sugar is less than 70 mg/dL, you will need to treat for low blood sugar: o Do not take insulin. o Treat a low blood sugar (less than 70 mg/dL) with  cup of clear juice (cranberry or apple), 4 glucose tablets, OR glucose gel. o Recheck blood sugar in 15 minutes after treatment (to make sure it is greater than 70 mg/dL). If your blood sugar is not greater than 70 mg/dL on recheck, call [phone] for further instructions. . Report your blood sugar to the short stay nurse when you get to Short Stay.  . If you are admitted to the hospital after surgery: o Your blood sugar will be checked by the staff and you will probably be given insulin after surgery (instead of oral diabetes medicines) to make sure you  have good blood sugar levels. o The goal for blood sugar control after surgery is 80-180 mg/dL.    Do not wear jewelry, make-up or nail polish.  Do not wear lotions, powders, or perfumes, or deodorant.  Do not shave 48 hours prior to surgery.   Do not bring valuables to the hospital.  [facility] is not responsible for any belongings or valuables.  Contacts, dentures or bridgework may not be worn into surgery.  Leave your suitcase in the car.  After surgery it may be brought to  your room.  For patients admitted to the hospital, discharge time will be determined by your treatment team.  Patients discharged the day of surgery will not be allowed to drive home.    Special instructions:   [place]- Preparing For Surgery  Before surgery, you can play an important role. Because skin is not sterile, your skin needs to be as free of germs as possible. You can reduce the number of germs on your skin by washing with CHG (chlorahexidine gluconate) Soap before surgery.  CHG is an antiseptic cleaner which kills germs and bonds with the skin to continue killing germs even after washing.  Please do not use if you have an allergy to CHG or antibacterial soaps. If your skin becomes reddened/irritated stop using the CHG.  Do not shave (including legs and underarms) for at least 48 hours prior to first CHG shower. It is OK to shave your face.  Please follow these instructions carefully.   1. Shower the NIGHT BEFORE SURGERY and the MORNING OF SURGERY with CHG.   2. If you chose to wash your hair, wash your hair first as usual with your normal shampoo.  3. After you shampoo, rinse your hair and body thoroughly to remove the shampoo.  4. Use CHG as you would any other liquid soap. You can apply CHG directly to the skin and wash gently with a scrungie or a clean washcloth.   5. Apply the CHG Soap to your body ONLY FROM THE NECK DOWN.  Do not use on open wounds or open sores. Avoid contact with your eyes, ears, mouth and genitals (private parts). Wash Face and genitals (private parts)  with your normal soap.  6. Wash thoroughly, paying special attention to the area where your surgery will be performed.  7. Thoroughly rinse your body with warm water from the neck down.  8. DO NOT shower/wash with your normal soap after using and rinsing off the CHG Soap.  9. Pat yourself dry with a CLEAN TOWEL.  10. Wear CLEAN PAJAMAS to bed the night before surgery, wear comfortable clothes  the morning of surgery  11. Place CLEAN SHEETS on your bed the night of your first shower and DO NOT SLEEP WITH PETS.    Day of Surgery: Do not apply any deodorants/lotions. Please wear clean clothes to the hospital/surgery center.      Please read over the following fact sheets that you were given.

## 2017-12-25 ENCOUNTER — Other Ambulatory Visit: Payer: Self-pay

## 2017-12-25 ENCOUNTER — Encounter (HOSPITAL_COMMUNITY)
Admission: RE | Admit: 2017-12-25 | Discharge: 2017-12-25 | Disposition: A | Discharge status: Home / Self Care | Payer: Medicare Other | Source: Ambulatory Visit | Attending: Neurological Surgery | Admitting: Neurological Surgery

## 2017-12-25 ENCOUNTER — Encounter (HOSPITAL_COMMUNITY): Payer: Self-pay

## 2017-12-25 DIAGNOSIS — K219 Gastro-esophageal reflux disease without esophagitis: Secondary | ICD-10-CM | POA: Insufficient documentation

## 2017-12-25 DIAGNOSIS — I10 Essential (primary) hypertension: Secondary | ICD-10-CM | POA: Insufficient documentation

## 2017-12-25 DIAGNOSIS — I459 Conduction disorder, unspecified: Secondary | ICD-10-CM | POA: Insufficient documentation

## 2017-12-25 DIAGNOSIS — Z01810 Encounter for preprocedural cardiovascular examination: Secondary | ICD-10-CM | POA: Diagnosis not present

## 2017-12-25 DIAGNOSIS — E785 Hyperlipidemia, unspecified: Secondary | ICD-10-CM | POA: Insufficient documentation

## 2017-12-25 DIAGNOSIS — E669 Obesity, unspecified: Secondary | ICD-10-CM | POA: Diagnosis not present

## 2017-12-25 DIAGNOSIS — M21372 Foot drop, left foot: Secondary | ICD-10-CM | POA: Diagnosis not present

## 2017-12-25 DIAGNOSIS — D649 Anemia, unspecified: Secondary | ICD-10-CM | POA: Diagnosis not present

## 2017-12-25 DIAGNOSIS — Z01818 Encounter for other preprocedural examination: Secondary | ICD-10-CM | POA: Insufficient documentation

## 2017-12-25 DIAGNOSIS — Z01812 Encounter for preprocedural laboratory examination: Secondary | ICD-10-CM | POA: Insufficient documentation

## 2017-12-25 DIAGNOSIS — E119 Type 2 diabetes mellitus without complications: Secondary | ICD-10-CM | POA: Diagnosis not present

## 2017-12-25 HISTORY — DX: Pneumonitis due to inhalation of food and vomit: J69.0

## 2017-12-25 HISTORY — DX: Unspecified nephritic syndrome with unspecified morphologic changes: N05.9

## 2017-12-25 LAB — CBC WITH DIFFERENTIAL/PLATELET
Basophils Absolute: 0 10*3/uL (ref 0.0–0.1)
Basophils Relative: 0 %
EOS PCT: 0 %
Eosinophils Absolute: 0 10*3/uL (ref 0.0–0.7)
HEMATOCRIT: 33.5 % — AB (ref 36.0–46.0)
Hemoglobin: 10.8 g/dL — ABNORMAL LOW (ref 12.0–15.0)
LYMPHS ABS: 0.3 10*3/uL — AB (ref 0.7–4.0)
LYMPHS PCT: 6 %
MCH: 29.5 pg (ref 26.0–34.0)
MCHC: 32.2 g/dL (ref 30.0–36.0)
MCV: 91.5 fL (ref 78.0–100.0)
MONO ABS: 0.1 10*3/uL (ref 0.1–1.0)
Monocytes Relative: 2 %
NEUTROS ABS: 4.7 10*3/uL (ref 1.7–7.7)
Neutrophils Relative %: 92 %
PLATELETS: 138 10*3/uL — AB (ref 150–400)
RBC: 3.66 MIL/uL — ABNORMAL LOW (ref 3.87–5.11)
RDW: 17.3 % — AB (ref 11.5–15.5)
WBC: 5.1 10*3/uL (ref 4.0–10.5)

## 2017-12-25 LAB — HEMOGLOBIN A1C
Hgb A1c MFr Bld: 7.6 % — ABNORMAL HIGH (ref 4.8–5.6)
MEAN PLASMA GLUCOSE: 171.42 mg/dL

## 2017-12-25 LAB — BASIC METABOLIC PANEL
ANION GAP: 9 (ref 5–15)
BUN: 32 mg/dL — AB (ref 6–20)
CHLORIDE: 105 mmol/L (ref 101–111)
CO2: 24 mmol/L (ref 22–32)
Calcium: 8.7 mg/dL — ABNORMAL LOW (ref 8.9–10.3)
Creatinine, Ser: 1.41 mg/dL — ABNORMAL HIGH (ref 0.44–1.00)
GFR calc Af Amer: 42 mL/min — ABNORMAL LOW (ref >60)
GFR calc non Af Amer: 37 mL/min — ABNORMAL LOW (ref >60)
GLUCOSE: 213 mg/dL — AB (ref 65–99)
POTASSIUM: 3.6 mmol/L (ref 3.5–5.1)
Sodium: 138 mmol/L (ref 135–145)

## 2017-12-25 LAB — PROTIME-INR
INR: 1.04
Prothrombin Time: 13.5 seconds (ref 11.4–15.2)

## 2017-12-25 LAB — TYPE AND SCREEN
ABO/RH(D): B NEG
Antibody Screen: NEGATIVE

## 2017-12-25 LAB — SURGICAL PCR SCREEN
MRSA, PCR: NEGATIVE
Staphylococcus aureus: NEGATIVE

## 2017-12-25 LAB — GLUCOSE, CAPILLARY: GLUCOSE-CAPILLARY: 252 mg/dL — AB (ref 65–99)

## 2017-12-25 LAB — ABO/RH: ABO/RH(D): B NEG

## 2017-12-25 NOTE — Progress Notes (Signed)
PCP - Wenda Low Cardiologist - Thomas wall > 10 years  Chest x-ray - 12/25/17 EKG - 12/25/17 Stress Test - 2016 ECHO - 2002 Cardiac Cath - denies    Fasting Blood Sugar - 100s Checks Blood Sugar __4___ times a day   Aspirin Instructions: continue per dr. Ronnald Ramp  Anesthesia review: yes  Patient denies shortness of breath, fever, cough and chest pain at PAT appointment   Patient verbalized understanding of instructions that were given to them at the PAT appointment. Patient was also instructed that they will need to review over the PAT instructions again at home before surgery.

## 2017-12-26 NOTE — Progress Notes (Signed)
Anesthesia Chart Review:  Pt is a 72 year old female scheduled for L3-4 laminectomy and foraminotomy, L4-5 left extraforaminal diskectomy, L peroneal nerve decompression on 12/31/2017 with Sherley Bounds, MD  - PCP is Wenda Low, MD - Nephrologist is Jamal Maes, MD. Notes 08/12/17 in media tab  PMH includes:  HTN, hyperlipidemia, CKD (stage III), anemia, post-op N/V. Never smoker. BMI 30  Medications include: Amlodipine, ASA 81 mg, Crestor, cytoxan (on hold), NovoLog, tresiba, Protonix, prednisone  BP (!) 156/72   Pulse 89   Temp 36.8 C   Resp 20   Ht 5\' 3"  (1.6 m)   Wt 169 lb 3.2 oz (76.7 kg)   SpO2 100%   BMI 29.97 kg/m   Preoperative labs reviewed.   - HbA1c 7.6, glucose 213 - Cr 1.41, BUN 32.  This is consistent with prior labs from nephrologist's office  CXR 12/25/17: No active cardiopulmonary disease.  EKG 12/25/17: Sinus rhythm with PACs. Minimal voltage criteria for LVH, may be normal variant  Nuclear stress test 11/01/14:  - Normal stress nuclear study. LV Ejection Fraction: 74%.  LV Wall Motion:  Normal Wall Motion   If no changes, I anticipate pt can proceed with surgery as scheduled.   Willeen Cass, FNP-BC [facility] Short Stay Surgical Center/Anesthesiology Phone: [phone] 12/26/2017 11:06 AM

## 2017-12-29 DIAGNOSIS — E1165 Type 2 diabetes mellitus with hyperglycemia: Secondary | ICD-10-CM | POA: Diagnosis not present

## 2017-12-29 DIAGNOSIS — Z5181 Encounter for therapeutic drug level monitoring: Secondary | ICD-10-CM | POA: Diagnosis not present

## 2017-12-29 DIAGNOSIS — Z794 Long term (current) use of insulin: Secondary | ICD-10-CM | POA: Diagnosis not present

## 2017-12-31 ENCOUNTER — Ambulatory Visit (HOSPITAL_COMMUNITY)
Admission: RE | Disposition: A | Discharge status: Home / Self Care | Payer: Self-pay | Source: Ambulatory Visit | Attending: Neurological Surgery

## 2017-12-31 ENCOUNTER — Inpatient Hospital Stay (HOSPITAL_COMMUNITY): Payer: Medicare Other | Admitting: Emergency Medicine

## 2017-12-31 ENCOUNTER — Encounter (HOSPITAL_COMMUNITY): Payer: Self-pay | Admitting: *Deleted

## 2017-12-31 ENCOUNTER — Inpatient Hospital Stay (HOSPITAL_COMMUNITY): Payer: Medicare Other | Admitting: Anesthesiology

## 2017-12-31 ENCOUNTER — Inpatient Hospital Stay (HOSPITAL_COMMUNITY): Payer: Medicare Other

## 2017-12-31 ENCOUNTER — Observation Stay (HOSPITAL_COMMUNITY)
Admission: RE | Admit: 2017-12-31 | Discharge: 2018-01-01 | Disposition: A | Discharge status: Home / Self Care | Payer: Medicare Other | Source: Ambulatory Visit | Attending: Neurological Surgery | Admitting: Neurological Surgery

## 2017-12-31 DIAGNOSIS — Z880 Allergy status to penicillin: Secondary | ICD-10-CM | POA: Diagnosis not present

## 2017-12-31 DIAGNOSIS — Z794 Long term (current) use of insulin: Secondary | ICD-10-CM | POA: Insufficient documentation

## 2017-12-31 DIAGNOSIS — Z882 Allergy status to sulfonamides status: Secondary | ICD-10-CM | POA: Insufficient documentation

## 2017-12-31 DIAGNOSIS — M48061 Spinal stenosis, lumbar region without neurogenic claudication: Secondary | ICD-10-CM | POA: Diagnosis not present

## 2017-12-31 DIAGNOSIS — Z7982 Long term (current) use of aspirin: Secondary | ICD-10-CM | POA: Diagnosis not present

## 2017-12-31 DIAGNOSIS — G629 Polyneuropathy, unspecified: Secondary | ICD-10-CM | POA: Diagnosis not present

## 2017-12-31 DIAGNOSIS — Z9889 Other specified postprocedural states: Secondary | ICD-10-CM

## 2017-12-31 DIAGNOSIS — M5116 Intervertebral disc disorders with radiculopathy, lumbar region: Secondary | ICD-10-CM | POA: Diagnosis not present

## 2017-12-31 DIAGNOSIS — F329 Major depressive disorder, single episode, unspecified: Secondary | ICD-10-CM | POA: Insufficient documentation

## 2017-12-31 DIAGNOSIS — Z885 Allergy status to narcotic agent status: Secondary | ICD-10-CM | POA: Diagnosis not present

## 2017-12-31 DIAGNOSIS — E1122 Type 2 diabetes mellitus with diabetic chronic kidney disease: Secondary | ICD-10-CM | POA: Diagnosis not present

## 2017-12-31 DIAGNOSIS — Z419 Encounter for procedure for purposes other than remedying health state, unspecified: Secondary | ICD-10-CM

## 2017-12-31 DIAGNOSIS — Z7952 Long term (current) use of systemic steroids: Secondary | ICD-10-CM | POA: Insufficient documentation

## 2017-12-31 DIAGNOSIS — M21372 Foot drop, left foot: Secondary | ICD-10-CM | POA: Insufficient documentation

## 2017-12-31 DIAGNOSIS — Z7951 Long term (current) use of inhaled steroids: Secondary | ICD-10-CM | POA: Diagnosis not present

## 2017-12-31 DIAGNOSIS — I10 Essential (primary) hypertension: Secondary | ICD-10-CM | POA: Insufficient documentation

## 2017-12-31 DIAGNOSIS — I129 Hypertensive chronic kidney disease with stage 1 through stage 4 chronic kidney disease, or unspecified chronic kidney disease: Secondary | ICD-10-CM | POA: Diagnosis not present

## 2017-12-31 DIAGNOSIS — M21379 Foot drop, unspecified foot: Secondary | ICD-10-CM | POA: Diagnosis not present

## 2017-12-31 DIAGNOSIS — Z79899 Other long term (current) drug therapy: Secondary | ICD-10-CM | POA: Insufficient documentation

## 2017-12-31 DIAGNOSIS — Z881 Allergy status to other antibiotic agents status: Secondary | ICD-10-CM | POA: Diagnosis not present

## 2017-12-31 DIAGNOSIS — M5416 Radiculopathy, lumbar region: Secondary | ICD-10-CM | POA: Diagnosis not present

## 2017-12-31 DIAGNOSIS — Z981 Arthrodesis status: Secondary | ICD-10-CM | POA: Diagnosis not present

## 2017-12-31 DIAGNOSIS — N183 Chronic kidney disease, stage 3 (moderate): Secondary | ICD-10-CM | POA: Diagnosis not present

## 2017-12-31 DIAGNOSIS — M5126 Other intervertebral disc displacement, lumbar region: Secondary | ICD-10-CM | POA: Insufficient documentation

## 2017-12-31 HISTORY — PX: LUMBAR LAMINECTOMY/DECOMPRESSION MICRODISCECTOMY: SHX5026

## 2017-12-31 HISTORY — PX: PERONEAL NERVE DECOMPRESSION: SHX2226

## 2017-12-31 LAB — GLUCOSE, CAPILLARY
GLUCOSE-CAPILLARY: 293 mg/dL — AB (ref 65–99)
Glucose-Capillary: 206 mg/dL — ABNORMAL HIGH (ref 65–99)
Glucose-Capillary: 272 mg/dL — ABNORMAL HIGH (ref 65–99)
Glucose-Capillary: 238 mg/dL — ABNORMAL HIGH (ref 65–99)
Glucose-Capillary: 363 mg/dL — ABNORMAL HIGH (ref 65–99)

## 2017-12-31 SURGERY — LUMBAR LAMINECTOMY/DECOMPRESSION MICRODISCECTOMY 2 LEVELS
Anesthesia: General | Site: Spine Lumbar | Laterality: Left

## 2017-12-31 MED ORDER — VANCOMYCIN HCL IN DEXTROSE 1-5 GM/200ML-% IV SOLN
1000.0000 mg | Freq: Once | INTRAVENOUS | Status: AC
  Administered 2018-01-01: 1000 mg via INTRAVENOUS
  Filled 2017-12-31: qty 200

## 2017-12-31 MED ORDER — SCOPOLAMINE 1 MG/3DAYS TD PT72
MEDICATED_PATCH | TRANSDERMAL | Status: AC
  Administered 2017-12-31: 1.5 mg via TRANSDERMAL
  Filled 2017-12-31: qty 1

## 2017-12-31 MED ORDER — METHYLPREDNISOLONE ACETATE 80 MG/ML IJ SUSP
INTRAMUSCULAR | Status: DC | PRN
  Administered 2017-12-31: 80 mg

## 2017-12-31 MED ORDER — NEOSTIGMINE METHYLSULFATE 5 MG/5ML IV SOSY
PREFILLED_SYRINGE | INTRAVENOUS | Status: AC
  Filled 2017-12-31: qty 5

## 2017-12-31 MED ORDER — HYDROMORPHONE HCL 1 MG/ML IJ SOLN: 0.5000 mg | INTRAMUSCULAR | Status: DC | PRN

## 2017-12-31 MED ORDER — ROCURONIUM BROMIDE 50 MG/5ML IV SOSY
PREFILLED_SYRINGE | INTRAVENOUS | Status: DC | PRN
  Administered 2017-12-31: 50 mg via INTRAVENOUS
  Administered 2017-12-31: 20 mg via INTRAVENOUS
  Administered 2017-12-31: 10 mg via INTRAVENOUS

## 2017-12-31 MED ORDER — SODIUM CHLORIDE 0.9 % IR SOLN
Status: DC | PRN
  Administered 2017-12-31: 500 mL

## 2017-12-31 MED ORDER — INSULIN ASPART 100 UNIT/ML ~~LOC~~ SOLN
5.0000 [IU] | Freq: Once | SUBCUTANEOUS | Status: AC
  Administered 2017-12-31: 5 [IU] via SUBCUTANEOUS

## 2017-12-31 MED ORDER — DEXAMETHASONE 4 MG PO TABS
4.0000 mg | ORAL_TABLET | Freq: Four times a day (QID) | ORAL | Status: DC
  Administered 2018-01-01 (×3): 4 mg via ORAL
  Filled 2017-12-31 (×3): qty 1

## 2017-12-31 MED ORDER — PROMETHAZINE HCL 25 MG/ML IJ SOLN: 6.2500 mg | INTRAMUSCULAR | Status: DC | PRN

## 2017-12-31 MED ORDER — POTASSIUM CHLORIDE IN NACL 20-0.9 MEQ/L-% IV SOLN: INTRAVENOUS | Status: DC

## 2017-12-31 MED ORDER — DEXAMETHASONE SODIUM PHOSPHATE 4 MG/ML IJ SOLN
4.0000 mg | Freq: Four times a day (QID) | INTRAMUSCULAR | Status: DC
  Administered 2017-12-31: 4 mg via INTRAVENOUS
  Filled 2017-12-31: qty 1

## 2017-12-31 MED ORDER — PHENOL 1.4 % MT LIQD: 1.0000 | OROMUCOSAL | Status: DC | PRN

## 2017-12-31 MED ORDER — INSULIN ASPART 100 UNIT/ML ~~LOC~~ SOLN: 0.0000 [IU] | Freq: Three times a day (TID) | SUBCUTANEOUS | Status: DC

## 2017-12-31 MED ORDER — OXYCODONE HCL 5 MG PO TABS
ORAL_TABLET | ORAL | Status: AC
  Filled 2017-12-31: qty 1

## 2017-12-31 MED ORDER — PANTOPRAZOLE SODIUM 40 MG PO TBEC: 40.0000 mg | DELAYED_RELEASE_TABLET | Freq: Every day | ORAL | Status: DC

## 2017-12-31 MED ORDER — LIDOCAINE 2% (20 MG/ML) 5 ML SYRINGE
INTRAMUSCULAR | Status: DC | PRN
  Administered 2017-12-31: 80 mg via INTRAVENOUS

## 2017-12-31 MED ORDER — EPHEDRINE 5 MG/ML INJ
INTRAVENOUS | Status: AC
  Filled 2017-12-31: qty 10

## 2017-12-31 MED ORDER — 0.9 % SODIUM CHLORIDE (POUR BTL) OPTIME
TOPICAL | Status: DC | PRN
  Administered 2017-12-31: 1000 mL

## 2017-12-31 MED ORDER — SODIUM CHLORIDE 0.9% FLUSH: 3.0000 mL | INTRAVENOUS | Status: DC | PRN

## 2017-12-31 MED ORDER — ROCURONIUM BROMIDE 10 MG/ML (PF) SYRINGE
PREFILLED_SYRINGE | INTRAVENOUS | Status: AC
  Filled 2017-12-31: qty 5

## 2017-12-31 MED ORDER — DEXAMETHASONE SODIUM PHOSPHATE 10 MG/ML IJ SOLN
10.0000 mg | INTRAMUSCULAR | Status: AC
Start: 2017-12-31 — End: 2017-12-31
  Administered 2017-12-31: 10 mg via INTRAVENOUS
  Filled 2017-12-31: qty 1

## 2017-12-31 MED ORDER — PROPOFOL 10 MG/ML IV BOLUS
INTRAVENOUS | Status: DC | PRN
  Administered 2017-12-31: 50 mg via INTRAVENOUS
  Administered 2017-12-31: 100 mg via INTRAVENOUS
  Administered 2017-12-31: 50 mg via INTRAVENOUS

## 2017-12-31 MED ORDER — HYDROMORPHONE HCL 1 MG/ML IJ SOLN
0.2500 mg | INTRAMUSCULAR | Status: DC | PRN
  Administered 2017-12-31 (×2): 0.5 mg via INTRAVENOUS

## 2017-12-31 MED ORDER — ONDANSETRON HCL 4 MG PO TABS: 4.0000 mg | ORAL_TABLET | Freq: Four times a day (QID) | ORAL | Status: DC | PRN

## 2017-12-31 MED ORDER — ONDANSETRON HCL 4 MG/2ML IJ SOLN
INTRAMUSCULAR | Status: AC
  Filled 2017-12-31: qty 2

## 2017-12-31 MED ORDER — INSULIN ASPART 100 UNIT/ML ~~LOC~~ SOLN
SUBCUTANEOUS | Status: AC
  Filled 2017-12-31: qty 1

## 2017-12-31 MED ORDER — SODIUM CHLORIDE 0.9 % IV SOLN: 250.0000 mL | INTRAVENOUS | Status: DC

## 2017-12-31 MED ORDER — SCOPOLAMINE 1 MG/3DAYS TD PT72
1.0000 | MEDICATED_PATCH | TRANSDERMAL | Status: DC
  Administered 2017-12-31: 1.5 mg via TRANSDERMAL
  Filled 2017-12-31: qty 1

## 2017-12-31 MED ORDER — METHOCARBAMOL 500 MG PO TABS
500.0000 mg | ORAL_TABLET | Freq: Four times a day (QID) | ORAL | Status: DC | PRN
  Administered 2017-12-31 – 2018-01-01 (×3): 500 mg via ORAL
  Filled 2017-12-31 (×2): qty 1

## 2017-12-31 MED ORDER — OXYCODONE HCL 5 MG PO TABS
5.0000 mg | ORAL_TABLET | ORAL | Status: DC | PRN
  Administered 2017-12-31 – 2018-01-01 (×5): 5 mg via ORAL
  Filled 2017-12-31 (×4): qty 1

## 2017-12-31 MED ORDER — VENLAFAXINE HCL ER 75 MG PO CP24: 75.0000 mg | ORAL_CAPSULE | Freq: Every morning | ORAL | Status: DC

## 2017-12-31 MED ORDER — VANCOMYCIN HCL IN DEXTROSE 1-5 GM/200ML-% IV SOLN
1000.0000 mg | INTRAVENOUS | Status: AC
  Administered 2017-12-31: 1000 mg via INTRAVENOUS
  Filled 2017-12-31: qty 200

## 2017-12-31 MED ORDER — ONDANSETRON HCL 4 MG/2ML IJ SOLN
INTRAMUSCULAR | Status: DC | PRN
  Administered 2017-12-31: 4 mg via INTRAVENOUS

## 2017-12-31 MED ORDER — VANCOMYCIN HCL IN DEXTROSE 1-5 GM/200ML-% IV SOLN
INTRAVENOUS | Status: AC
  Filled 2017-12-31: qty 200

## 2017-12-31 MED ORDER — LIDOCAINE HCL (CARDIAC) 20 MG/ML IV SOLN
INTRAVENOUS | Status: AC
  Filled 2017-12-31: qty 5

## 2017-12-31 MED ORDER — SUGAMMADEX SODIUM 200 MG/2ML IV SOLN
INTRAVENOUS | Status: DC | PRN
  Administered 2017-12-31: 200 mg via INTRAVENOUS

## 2017-12-31 MED ORDER — CHLORHEXIDINE GLUCONATE CLOTH 2 % EX PADS: 6.0000 | MEDICATED_PAD | Freq: Once | CUTANEOUS | Status: DC

## 2017-12-31 MED ORDER — ACETAMINOPHEN 650 MG RE SUPP: 650.0000 mg | RECTAL | Status: DC | PRN

## 2017-12-31 MED ORDER — THROMBIN 5000 UNITS EX SOLR
CUTANEOUS | Status: AC
  Filled 2017-12-31: qty 15000

## 2017-12-31 MED ORDER — INSULIN ASPART 100 UNIT/ML ~~LOC~~ SOLN
SUBCUTANEOUS | Status: AC
  Administered 2017-12-31: 4 [IU] via SUBCUTANEOUS
  Filled 2017-12-31: qty 1

## 2017-12-31 MED ORDER — FENTANYL CITRATE (PF) 250 MCG/5ML IJ SOLN
INTRAMUSCULAR | Status: AC
  Filled 2017-12-31: qty 5

## 2017-12-31 MED ORDER — DEXAMETHASONE SODIUM PHOSPHATE 10 MG/ML IJ SOLN
INTRAMUSCULAR | Status: AC
  Filled 2017-12-31: qty 1

## 2017-12-31 MED ORDER — HYDROMORPHONE HCL 1 MG/ML IJ SOLN
INTRAMUSCULAR | Status: AC
  Filled 2017-12-31: qty 1

## 2017-12-31 MED ORDER — ARTIFICIAL TEARS OPHTHALMIC OINT
TOPICAL_OINTMENT | OPHTHALMIC | Status: AC
  Filled 2017-12-31: qty 3.5

## 2017-12-31 MED ORDER — BUPIVACAINE HCL (PF) 0.25 % IJ SOLN
INTRAMUSCULAR | Status: DC | PRN
  Administered 2017-12-31 (×2): 6 mL

## 2017-12-31 MED ORDER — SODIUM CHLORIDE 0.9% FLUSH: 3.0000 mL | Freq: Two times a day (BID) | INTRAVENOUS | Status: DC

## 2017-12-31 MED ORDER — PROPOFOL 10 MG/ML IV BOLUS
INTRAVENOUS | Status: AC
  Filled 2017-12-31: qty 20

## 2017-12-31 MED ORDER — PREDNISONE 20 MG PO TABS: 20.0000 mg | ORAL_TABLET | Freq: Every day | ORAL | Status: DC

## 2017-12-31 MED ORDER — MENTHOL 3 MG MT LOZG: 1.0000 | LOZENGE | OROMUCOSAL | Status: DC | PRN

## 2017-12-31 MED ORDER — LACTATED RINGERS IV SOLN
INTRAVENOUS | Status: DC
  Administered 2017-12-31 (×2): via INTRAVENOUS

## 2017-12-31 MED ORDER — SENNA 8.6 MG PO TABS
1.0000 | ORAL_TABLET | Freq: Two times a day (BID) | ORAL | Status: DC
  Administered 2017-12-31: 8.6 mg via ORAL
  Filled 2017-12-31: qty 1

## 2017-12-31 MED ORDER — HEMOSTATIC AGENTS (NO CHARGE) OPTIME
TOPICAL | Status: DC | PRN
  Administered 2017-12-31: 1 via TOPICAL

## 2017-12-31 MED ORDER — ACETAMINOPHEN 325 MG PO TABS: 650.0000 mg | ORAL_TABLET | ORAL | Status: DC | PRN

## 2017-12-31 MED ORDER — INSULIN ASPART 100 UNIT/ML ~~LOC~~ SOLN
0.0000 [IU] | Freq: Three times a day (TID) | SUBCUTANEOUS | Status: DC
  Administered 2018-01-01: 15 [IU] via SUBCUTANEOUS
  Administered 2018-01-01: 8 [IU] via SUBCUTANEOUS

## 2017-12-31 MED ORDER — METHYLPREDNISOLONE ACETATE 80 MG/ML IJ SUSP
INTRAMUSCULAR | Status: AC
  Filled 2017-12-31: qty 1

## 2017-12-31 MED ORDER — METHOCARBAMOL 500 MG PO TABS
ORAL_TABLET | ORAL | Status: AC
  Filled 2017-12-31: qty 1

## 2017-12-31 MED ORDER — INSULIN ASPART 100 UNIT/ML ~~LOC~~ SOLN
4.0000 [IU] | Freq: Once | SUBCUTANEOUS | Status: AC
  Administered 2017-12-31: 4 [IU] via SUBCUTANEOUS

## 2017-12-31 MED ORDER — METHOCARBAMOL 1000 MG/10ML IJ SOLN
500.0000 mg | Freq: Four times a day (QID) | INTRAVENOUS | Status: DC | PRN
  Filled 2017-12-31: qty 5

## 2017-12-31 MED ORDER — INSULIN ASPART 100 UNIT/ML ~~LOC~~ SOLN
0.0000 [IU] | Freq: Every day | SUBCUTANEOUS | Status: DC
  Administered 2017-12-31: 5 [IU] via SUBCUTANEOUS

## 2017-12-31 MED ORDER — THROMBIN 5000 UNITS EX SOLR
CUTANEOUS | Status: DC | PRN
  Administered 2017-12-31 (×2): 5000 [IU] via TOPICAL

## 2017-12-31 MED ORDER — THROMBIN (RECOMBINANT) 5000 UNITS EX SOLR
OROMUCOSAL | Status: DC | PRN
  Administered 2017-12-31: 5 mL via TOPICAL

## 2017-12-31 MED ORDER — BUPIVACAINE HCL (PF) 0.25 % IJ SOLN
INTRAMUSCULAR | Status: AC
  Filled 2017-12-31: qty 30

## 2017-12-31 MED ORDER — SUGAMMADEX SODIUM 200 MG/2ML IV SOLN
INTRAVENOUS | Status: AC
  Filled 2017-12-31: qty 2

## 2017-12-31 MED ORDER — FENTANYL CITRATE (PF) 100 MCG/2ML IJ SOLN
INTRAMUSCULAR | Status: DC | PRN
  Administered 2017-12-31 (×5): 50 ug via INTRAVENOUS

## 2017-12-31 MED ORDER — ONDANSETRON HCL 4 MG/2ML IJ SOLN: 4.0000 mg | Freq: Four times a day (QID) | INTRAMUSCULAR | Status: DC | PRN

## 2017-12-31 MED ORDER — AMLODIPINE BESYLATE 5 MG PO TABS: 10.0000 mg | ORAL_TABLET | Freq: Every day | ORAL | Status: DC

## 2017-12-31 SURGICAL SUPPLY — 71 items
ADH SKN CLS APL DERMABOND .7 (GAUZE/BANDAGES/DRESSINGS) ×2
APL SKNCLS STERI-STRIP NONHPOA (GAUZE/BANDAGES/DRESSINGS) ×4
BAG DECANTER FOR FLEXI CONT (MISCELLANEOUS) ×5 IMPLANT
BANDAGE ACE 4X5 VEL STRL LF (GAUZE/BANDAGES/DRESSINGS) ×3 IMPLANT
BANDAGE ELASTIC 4 VELCRO ST LF (GAUZE/BANDAGES/DRESSINGS) ×1 IMPLANT
BENZOIN TINCTURE PRP APPL 2/3 (GAUZE/BANDAGES/DRESSINGS) ×6 IMPLANT
BNDG GAUZE ELAST 4 BULKY (GAUZE/BANDAGES/DRESSINGS) ×3 IMPLANT
BUR MATCHSTICK NEURO 3.0 LAGG (BURR) ×3 IMPLANT
CABLE BIPOLOR RESECTION CORD (MISCELLANEOUS) ×1 IMPLANT
CANISTER SUCT 3000ML PPV (MISCELLANEOUS) ×6 IMPLANT
CARTRIDGE OIL MAESTRO DRILL (MISCELLANEOUS) ×4 IMPLANT
DERMABOND ADVANCED (GAUZE/BANDAGES/DRESSINGS) ×1
DERMABOND ADVANCED .7 DNX12 (GAUZE/BANDAGES/DRESSINGS) IMPLANT
DIFFUSER DRILL AIR PNEUMATIC (MISCELLANEOUS) ×5 IMPLANT
DRAPE INCISE IOBAN 66X45 STRL (DRAPES) ×1 IMPLANT
DRAPE LAPAROTOMY 100X72X124 (DRAPES) ×4 IMPLANT
DRAPE MICROSCOPE LEICA (MISCELLANEOUS) ×3 IMPLANT
DRAPE POUCH INSTRU U-SHP 10X18 (DRAPES) ×3 IMPLANT
DRAPE SURG 17X23 STRL (DRAPES) ×3 IMPLANT
DRSG OPSITE POSTOP 4X6 (GAUZE/BANDAGES/DRESSINGS) ×1 IMPLANT
DURAPREP 26ML APPLICATOR (WOUND CARE) ×6 IMPLANT
ELECT CAUTERY BLADE 6.4 (BLADE) ×1 IMPLANT
ELECT REM PT RETURN 9FT ADLT (ELECTROSURGICAL) ×3
ELECTRODE REM PT RTRN 9FT ADLT (ELECTROSURGICAL) ×4 IMPLANT
GAUZE SPONGE 4X4 12PLY STRL (GAUZE/BANDAGES/DRESSINGS) ×2 IMPLANT
GAUZE SPONGE 4X4 16PLY XRAY LF (GAUZE/BANDAGES/DRESSINGS) IMPLANT
GLOVE BIO SURGEON STRL SZ 6.5 (GLOVE) ×2 IMPLANT
GLOVE BIO SURGEON STRL SZ7 (GLOVE) ×2 IMPLANT
GLOVE BIO SURGEON STRL SZ8 (GLOVE) ×7 IMPLANT
GLOVE BIOGEL PI IND STRL 6.5 (GLOVE) IMPLANT
GLOVE BIOGEL PI IND STRL 7.0 (GLOVE) IMPLANT
GLOVE BIOGEL PI IND STRL 7.5 (GLOVE) IMPLANT
GLOVE BIOGEL PI INDICATOR 6.5 (GLOVE) ×3
GLOVE BIOGEL PI INDICATOR 7.0 (GLOVE) ×3
GLOVE BIOGEL PI INDICATOR 7.5 (GLOVE) ×1
GLOVE SURG SS PI 6.0 STRL IVOR (GLOVE) ×3 IMPLANT
GOWN STRL REUS W/ TWL LRG LVL3 (GOWN DISPOSABLE) IMPLANT
GOWN STRL REUS W/ TWL XL LVL3 (GOWN DISPOSABLE) ×2 IMPLANT
GOWN STRL REUS W/TWL 2XL LVL3 (GOWN DISPOSABLE) ×2 IMPLANT
GOWN STRL REUS W/TWL LRG LVL3 (GOWN DISPOSABLE) ×15
GOWN STRL REUS W/TWL XL LVL3 (GOWN DISPOSABLE) ×6
HEMOSTAT POWDER KIT SURGIFOAM (HEMOSTASIS) ×1 IMPLANT
KIT BASIN OR (CUSTOM PROCEDURE TRAY) ×5 IMPLANT
KIT TURNOVER KIT B (KITS) ×5 IMPLANT
LOOP VESSEL MAXI BLUE (MISCELLANEOUS) ×3 IMPLANT
MARKER SKIN DUAL TIP RULER LAB (MISCELLANEOUS) ×1 IMPLANT
NDL HYPO 18GX1.5 BLUNT FILL (NEEDLE) IMPLANT
NDL HYPO 25X1 1.5 SAFETY (NEEDLE) ×4 IMPLANT
NDL SPNL 20GX3.5 QUINCKE YW (NEEDLE) IMPLANT
NEEDLE HYPO 18GX1.5 BLUNT FILL (NEEDLE) ×3 IMPLANT
NEEDLE HYPO 25X1 1.5 SAFETY (NEEDLE) ×9 IMPLANT
NEEDLE SPNL 20GX3.5 QUINCKE YW (NEEDLE) ×3 IMPLANT
NS IRRIG 1000ML POUR BTL (IV SOLUTION) ×5 IMPLANT
OIL CARTRIDGE MAESTRO DRILL (MISCELLANEOUS) ×3
PACK LAMINECTOMY NEURO (CUSTOM PROCEDURE TRAY) ×5 IMPLANT
PAD ARMBOARD 7.5X6 YLW CONV (MISCELLANEOUS) ×12 IMPLANT
PENCIL BUTTON HOLSTER BLD 10FT (ELECTRODE) ×1 IMPLANT
RUBBERBAND STERILE (MISCELLANEOUS) ×6 IMPLANT
SPONGE SURGIFOAM ABS GEL SZ50 (HEMOSTASIS) ×1 IMPLANT
STOCKINETTE 4X48 STRL (DRAPES) ×3 IMPLANT
STRIP CLOSURE SKIN 1/2X4 (GAUZE/BANDAGES/DRESSINGS) ×6 IMPLANT
SUT VIC AB 0 CT1 18XCR BRD8 (SUTURE) ×2 IMPLANT
SUT VIC AB 0 CT1 8-18 (SUTURE) ×3
SUT VIC AB 2-0 CP2 18 (SUTURE) ×6 IMPLANT
SUT VIC AB 3-0 SH 8-18 (SUTURE) ×7 IMPLANT
SYR 3ML LL SCALE MARK (SYRINGE) ×1 IMPLANT
TOWEL GREEN STERILE (TOWEL DISPOSABLE) ×6 IMPLANT
TOWEL GREEN STERILE FF (TOWEL DISPOSABLE) ×5 IMPLANT
TUBE CONNECTING 12X1/4 (SUCTIONS) ×1 IMPLANT
UNDERPAD 30X30 (UNDERPADS AND DIAPERS) ×2 IMPLANT
WATER STERILE IRR 1000ML POUR (IV SOLUTION) ×5 IMPLANT

## 2017-12-31 NOTE — Anesthesia Procedure Notes (Signed)
Procedure Name: Intubation Date/Time: 12/31/2017 3:27 PM Performed by: Genelle Bal, CRNA Pre-anesthesia Checklist: Patient identified, Emergency Drugs available, Suction available and Patient being monitored Patient Re-evaluated:Patient Re-evaluated prior to induction Oxygen Delivery Method: Circle system utilized Preoxygenation: Pre-oxygenation with 100% oxygen Induction Type: IV induction Ventilation: Mask ventilation without difficulty Laryngoscope Size: Miller and 2 Grade View: Grade I Tube type: Oral Tube size: 7.0 mm Number of attempts: 1 Airway Equipment and Method: Stylet and Oral airway Placement Confirmation: ETT inserted through vocal cords under direct vision,  positive ETCO2 and breath sounds checked- equal and bilateral Secured at: 21 cm Tube secured with: Tape Dental Injury: Teeth and Oropharynx as per pre-operative assessment

## 2017-12-31 NOTE — Op Note (Signed)
12/31/2017  5:49 PM  PATIENT:  [name]  72 y.o. female  PRE-OPERATIVE DIAGNOSIS:  Lumbar spinal stenosis L3-4 L4-5, external disc herniation L4-5 left, left peroneal neuropathy, left leg pain, left foot drop  POST-OPERATIVE DIAGNOSIS:  same  PROCEDURE:  1. Decompressive lumbar hemilaminectomy and medial facetectomy foraminotomy L3-4 and L4-5 on the left, 2. Extraforaminal microdiscectomy L4-5 left utilizing microscopic dissection, 3. Left peroneal nerve decompression through a separate incision  SURGEON:  Sherley Bounds, MD  ASSISTANTS: Meyran FNP  ANESTHESIA:   General  EBL: 100 ml  Total I/O In: 1100 [I.V.:1100] Out: 100 [Blood:100]  BLOOD ADMINISTERED: none  DRAINS: None  SPECIMEN:  none  INDICATION FOR PROCEDURE: This patient presented with left leg pain in the left foot drop and nerve conduction studies were consistent with a left L5 radiculopathy as well as a left peroneal neuropathy. Imaging showed spinal stenosis L5-4 and L4-5 with external disc herniation L4-5 on the left. The patient tried conservative measures without relief. Pain was debilitating. Recommended left peroneal nerve decompression and a left L3-4 and L4-5 decompression and next a foraminal discectomy. Patient understood the risks, benefits, and alternatives and potential outcomes and wished to proceed.  PROCEDURE DETAILS: The patient was taken to the operating room and after induction of adequate generalized endotracheal anesthesia, the patient was rolled into the prone position on chest rolls and all pressure points were padded. The left leg was slightly flexed and then prepped with DuraPrep around the knee. An incision was made behind the knee extending around the fibular head. A self retaining retractor was placed and I dissected down through the soft tissues to identify the fascia over the nerve. The fascia was opened with a 15 blade scalpel and the nerve immediately herniated through this opening.  There was significant pressure on the nerve. The nerve looks flattened. I then spread between the nerve and the fashion opening the fascia proximally and distally until the nerve was well decompressed. I saw the nerve branch to the deep and superficial branches. We decompressed it into the back of the knee. We then palpated proximally and distally. We irrigated with saline solution. Closed the subcutaneous tissues with 2-0 Vicryl. Closed the subcuticular tissue with 3-0 Vicryl. The skin was closed benzoin and Steri-Strips. The knee was then wrapped in a Kerlix and Ace bandage. The patient was then positioned and The lumbar region was cleaned and then prepped with DuraPrep and draped in the usual sterile fashion. 5 cc of local anesthesia was injected and then a dorsal midline incision was made and carried down to the lumbo sacral fascia. The fascia was opened and the paraspinous musculature was taken down in a subperiosteal fashion to expose L3-4 and L4-5 on the left. Intraoperative x-ray confirmed my level, and then I used a combination of the high-speed drill and the Kerrison punches to perform a hemilaminectomy, medial facetectomy, and foraminotomy at L3-4 and L4-5 on the left. The underlying yellow ligament was opened and removed in a piecemeal fashion to expose the underlying dura and exiting nerve root. I undercut the lateral recess and dissected down until I was medial to and distal to the pedicle. The nerve root was well decompressed. We then gently retracted the nerve root medially with a retractor, coagulated the epidural venous vasculature, and inspected the disc L4-5 space. There was a subannular bulge but we decided not to perform a discectomy here. I then dissected on the lateral pars and superior facet at L4-5 on the left  and drilled this away. I opened the intervening ligament to identify the underlying epidural fat. I was able to identify the disc below in the axilla of the nerve root. I identified  the exiting L4 nerve root. There was a large subannular disc protrusion. There was a free fragment up under the nerve root against the pedicle. I performed a thorough intradiscal discectomy with pituitary rongeurs and curettes, until I had a nice decompression of the nerve root. I removed the fragment from underneath the L4 nerve root. I then palpated with a coronary dilator along the nerve root and into the foramen to assure adequate decompression. I felt no more compression of the nerve root. I irrigated with saline solution containing bacitracin. Achieved hemostasis with bipolar cautery, lined the dura with Gelfoam, and at the extra foraminal space placed Depo-Medrol, and then closed the fascia with 0 Vicryl. I closed the subcutaneous tissues with 2-0 Vicryl and the subcuticular tissues with 3-0 Vicryl. The skin was then closed with benzoin and Steri-Strips. The drapes were removed, a sterile dressing was applied. The patient was awakened from general anesthesia and transferred to the recovery room in stable condition. At the end of the procedure all sponge, needle and instrument counts were correct.    PLAN OF CARE: Admit for overnight observation  PATIENT DISPOSITION:  PACU - hemodynamically stable.   Delay start of Pharmacological VTE agent (>24hrs) due to surgical blood loss or risk of bleeding:  yes

## 2017-12-31 NOTE — Progress Notes (Signed)
Dr Linna Caprice called and informed of cbg of 293 no new orders at this time.

## 2017-12-31 NOTE — Anesthesia Preprocedure Evaluation (Addendum)
Anesthesia Evaluation  Patient identified by MRN, date of birth, ID band Patient awake    Reviewed: Allergy & Precautions, NPO status , Patient's Chart, lab work & pertinent test results  History of Anesthesia Complications (+) PONV and history of anesthetic complications  Airway Mallampati: II  TM Distance: >3 FB Neck ROM: Full    Dental  (+) Chipped,    Pulmonary neg pulmonary ROS,    Pulmonary exam normal breath sounds clear to auscultation       Cardiovascular hypertension, Pt. on medications Normal cardiovascular exam Rhythm:Regular Rate:Normal  ECG: SR, PAC's, rate 85   Neuro/Psych PSYCHIATRIC DISORDERS Depression negative neurological ROS     GI/Hepatic Neg liver ROS, GERD  Medicated and Controlled,  Endo/Other  diabetes, Insulin Dependent  Renal/GU negative Renal ROS     Musculoskeletal negative musculoskeletal ROS (+)   Abdominal   Peds  Hematology  (+) anemia , HLD   Anesthesia Other Findings Left foot drop  Reproductive/Obstetrics                            Anesthesia Physical Anesthesia Plan  ASA: III  Anesthesia Plan: General   Post-op Pain Management:    Induction: Intravenous  PONV Risk Score and Plan: 4 or greater and Scopolamine patch - Pre-op, Midazolam, Dexamethasone, Ondansetron and Treatment may vary due to age or medical condition  Airway Management Planned: Oral ETT  Additional Equipment:   Intra-op Plan:   Post-operative Plan: Extubation in OR  Informed Consent: I have reviewed the patients History and Physical, chart, labs and discussed the procedure including the risks, benefits and alternatives for the proposed anesthesia with the patient or authorized representative who has indicated his/her understanding and acceptance.   Dental advisory given  Plan Discussed with: CRNA  Anesthesia Plan Comments:         Anesthesia Quick Evaluation

## 2017-12-31 NOTE — Progress Notes (Signed)
Pharmacy Antibiotic Note  Tina Briggs is a 72 y.o. female admitted on 12/31/2017 for spinal surgery and peroneal nerve decompression to receive surgical prophylaxis.  Pharmacy has been consulted for Vancomycin dosing. No drain was left in placed. The patient received pre-op Vancomycin 1g (~12mg /kg) at 12:14 PM. Patient is afebrile and prior WBC was within normal limits. SCr is noted to have been elevated on 12/25/17 at 1.41, but this appears to be close to baseline for this patient.   Plan: Vancomycin 1g (~12mg /kg) IV x1 at Midnight (12 hrs from pre-op dose).  Pharmacy will sign off as no drain is in place and therefore no further doses are needed.   Height: 5\' 3"  (160 cm) Weight: 169 lb (76.7 kg) IBW/kg (Calculated) : 52.4  Temp (24hrs), Avg:98.4 F (36.9 C), Min:98.1 F (36.7 C), Max:98.6 F (37 C)  Recent Labs  Lab 12/25/17 1442  WBC 5.1  CREATININE 1.41*    Estimated Creatinine Clearance: 35.9 mL/min (A) (by C-G formula based on SCr of 1.41 mg/dL (H)).    Allergies  Allergen Reactions  . Codeine Shortness Of Breath  . Hydrocodone     Doesn't like to take  . Tetracyclines & Related Itching  . Azithromycin Itching and Rash  . Erythromycin Itching and Rash  . Morphine And Related Itching and Rash  . Penicillins Itching and Rash    Has patient had a PCN reaction causing immediate rash, facial/tongue/throat swelling, SOB or lightheadedness with hypotension: Yes Has patient had a PCN reaction causing severe rash involving mucus membranes or skin necrosis: No Has patient had a PCN reaction that required hospitalization: No Has patient had a PCN reaction occurring within the last 10 years: No If all of the above answers are "NO", then may proceed with Cephalosporin use.   Tina Briggs [Sulfamethoxazole-Trimethoprim] Itching and Rash    Thank you for allowing pharmacy to be a part of this patient's care.  Tina Briggs, PharmD, BCPS, BCCCP Clinical Pharmacist Clinical  phone 12/31/2017 until [phone] After hours, please call #[phone] 12/31/2017 7:43 PM

## 2017-12-31 NOTE — Transfer of Care (Signed)
Immediate Anesthesia Transfer of Care Note  Patient: Tina Briggs  Procedure(s) Performed: Laminectomy and Foraminotomy - Lumbar Three-Four - left Extraforaminal diskectomy Lumbar Four-Five left (Left Spine Lumbar) Left PERONEAL NERVE DECOMPRESSION (Left Leg Lower)  Patient Location: PACU  Anesthesia Type:General  Level of Consciousness: awake, alert , oriented and patient cooperative  Airway & Oxygen Therapy: Patient Spontanous Breathing  Post-op Assessment: Report given to RN and Post -op Vital signs reviewed and stable  Post vital signs: Reviewed and stable  Last Vitals:  Vitals Value Taken Time  BP 157/78 12/31/2017  5:45 PM  Temp    Pulse 91 12/31/2017  5:47 PM  Resp 19 12/31/2017  5:47 PM  SpO2 97 % 12/31/2017  5:47 PM  Vitals shown include unvalidated device data.  Last Pain:  Vitals:   12/31/17 1745  TempSrc:   PainSc: (P) 0-No pain         Complications: No apparent anesthesia complications

## 2017-12-31 NOTE — H&P (Signed)
Subjective: Patient is a 72 y.o. female admitted for L foot drop with leg pain. Onset of symptoms was a few weeks ago, gradually worsening since that time.  The pain is rated moderate, and is located at the across the lower back and radiates to LLE. The pain is described as aching and occurs all day. The symptoms have been progressive. Symptoms are exacerbated by exercise. MRI or CT showed stenosis L3-4, HNP L4-5, emgs showed L peroneal neuropathy   Past Medical History:  Diagnosis Date  . Anemia   . Aspiration pneumonia ([place])    after common bile duct obstruction  . CATARACT, RIGHT EYE    had repaired  . Depression   . DM   . GERD   . Glomerulonephritis 2019  . HYPERLIPIDEMIA-MIXED   . HYPERTENSION, UNSPECIFIED   . OBESITY   . PONV (postoperative nausea and vomiting)    past history only    Past Surgical History:  Procedure Laterality Date  . ABDOMINAL HYSTERECTOMY    . APPENDECTOMY    . BACK SURGERY     lumbar '09"microdiscectomy"  . CHOLECYSTECTOMY     open  . COLONOSCOPY WITH PROPOFOL N/A 11/22/2014   Procedure: COLONOSCOPY WITH PROPOFOL;  Surgeon: Garlan Fair, MD;  Location: [facility];  Service: Endoscopy;  Laterality: N/A;  . ERCP N/A 05/26/2015   Procedure: ENDOSCOPIC RETROGRADE CHOLANGIOPANCREATOGRAPHY (ERCP)   (DOING CASE IN MAIN OR);  Surgeon: Teena Irani, MD;  Location: [facility];  Service: Gastroenterology;  Laterality: N/A;  . ERCP N/A 06/06/2015   Procedure: ENDOSCOPIC RETROGRADE CHOLANGIOPANCREATOGRAPHY (ERCP);  Surgeon: Clarene Essex, MD;  Location: [facility];  Service: Endoscopy;  Laterality: N/A;  . ESOPHAGOGASTRODUODENOSCOPY (EGD) WITH PROPOFOL N/A 11/22/2014   Procedure: ESOPHAGOGASTRODUODENOSCOPY (EGD) WITH PROPOFOL;  Surgeon: Garlan Fair, MD;  Location: [facility];  Service: Endoscopy;  Laterality: N/A;  . EYE SURGERY Bilateral    cataracts  . TUBAL LIGATION      Prior to Admission medications   Medication Sig Start Date End Date Taking?  Authorizing Provider  acetaminophen (TYLENOL) 500 MG tablet Take 1,000 mg by mouth 2 (two) times daily as needed for moderate pain or headache.    Yes [provider]  amLODipine (NORVASC) 10 MG tablet Take 10 mg by mouth daily.   Yes [provider]  aspirin EC 81 MG tablet Take 81 mg by mouth daily.   Yes [provider]  cholecalciferol (VITAMIN D) 1000 units tablet Take 1,000 Units by mouth daily.   Yes [provider]  CRESTOR 40 MG tablet TAKE 1 TABLET AT BEDTIME 04/19/13  Yes Wall, Marijo Conception, MD  cyclophosphamide (CYTOXAN) 50 MG capsule Take 150 mg by mouth daily. Give on an empty stomach 1 hour before or 2 hours after meals.   Yes [provider]  fluticasone (FLONASE) 50 MCG/ACT nasal spray Place 2 sprays into both nostrils daily as needed for allergies.    Yes [provider]  insulin aspart (NOVOLOG) 100 UNIT/ML injection Inject 10-38 Units into the skin 3 (three) times daily before meals.   Yes [provider]  insulin degludec (TRESIBA FLEXTOUCH) 100 UNIT/ML SOPN FlexTouch Pen Inject 3 Units into the skin at bedtime.   Yes [provider]  LORazepam (ATIVAN) 0.5 MG tablet Take 0.5 mg by mouth at bedtime as needed for anxiety.   Yes [provider]  methocarbamol (ROBAXIN) 750 MG tablet Take 750 mg by mouth at bedtime as needed for muscle spasms.  Yes [provider]  pantoprazole (PROTONIX) 40 MG tablet Take 40 mg by mouth daily.   Yes [provider]  predniSONE (DELTASONE) 20 MG tablet Take 20 mg by mouth daily.   Yes [provider]  venlafaxine XR (EFFEXOR-XR) 75 MG 24 hr capsule Take 75 mg by mouth every morning.    Yes [provider]  vitamin B-12 (CYANOCOBALAMIN) 1000 MCG tablet Take 1,000 mcg by mouth 3 (three) times a week. Take on Sundays, Wednesdays and Fridays.   Yes [provider]   Allergies  Allergen Reactions  . Codeine Shortness Of Breath  .  Hydrocodone     Doesn't like to take  . Tetracyclines & Related Itching  . Azithromycin Itching and Rash  . Erythromycin Itching and Rash  . Morphine And Related Itching and Rash  . Penicillins Itching and Rash    Has patient had a PCN reaction causing immediate rash, facial/tongue/throat swelling, SOB or lightheadedness with hypotension: Yes Has patient had a PCN reaction causing severe rash involving mucus membranes or skin necrosis: No Has patient had a PCN reaction that required hospitalization: No Has patient had a PCN reaction occurring within the last 10 years: No If all of the above answers are "NO", then may proceed with Cephalosporin use.   [name] [Sulfamethoxazole-Trimethoprim] Itching and Rash    Social History   Tobacco Use  . Smoking status: Never Smoker  . Smokeless tobacco: Never Used  Substance Use Topics  . Alcohol use: No    Family History  Problem Relation Age of Onset  . Cancer Maternal Aunt        breast cancer  . Cancer Maternal Grandmother        kidney cancer   . Cancer Paternal Grandmother        bone cancer      Review of Systems  Positive ROS: neg  All other systems have been reviewed and were otherwise negative with the exception of those mentioned in the HPI and as above.  Objective: Vital signs in last 24 hours: Temp:  [98.1 F (36.7 C)] 98.1 F (36.7 C) (04/03 1158) Pulse Rate:  [83] 83 (04/03 1158) Resp:  [20] 20 (04/03 1158) BP: (138)/(75) 138/75 (04/03 1158) SpO2:  [98 %] 98 % (04/03 1158) Weight:  [76.7 kg (169 lb)] 76.7 kg (169 lb) (04/03 1154)  General Appearance: Alert, cooperative, no distress, appears stated age Head: Normocephalic, without obvious abnormality, atraumatic Eyes: PERRL, conjunctiva/corneas clear, EOM's intact    Neck: Supple, symmetrical, trachea midline Back: Symmetric, no curvature, ROM normal, no CVA tenderness Lungs:  respirations unlabored Heart: Regular rate and rhythm Abdomen: Soft,  non-tender Extremities: Extremities normal, atraumatic, no cyanosis or edema Pulses: 2+ and symmetric all extremities Skin: Skin color, texture, turgor normal, no rashes or lesions  NEUROLOGIC:   Mental status: Alert and oriented x4,  no aphasia, good attention span, fund of knowledge, and memory Motor Exam - grossly normal X L foot drop Sensory Exam - grossly normal Reflexes: 1+ Coordination - grossly normal Gait - not tested Balance - grossly normal Cranial Nerves: I: smell Not tested  II: visual acuity  OS: nl    OD: nl  II: visual fields Full to confrontation  II: pupils Equal, round, reactive to light  III,VII: ptosis None  III,IV,VI: extraocular muscles  Full ROM  V: mastication Normal  V: facial light touch sensation  Normal  V,VII: corneal reflex  Present  VII: facial muscle function - upper  Normal  VII: facial muscle function - lower Normal  VIII: hearing Not tested  IX: soft palate elevation  Normal  IX,X: gag reflex Present  XI: trapezius strength  5/5  XI: sternocleidomastoid strength 5/5  XI: neck flexion strength  5/5  XII: tongue strength  Normal    Data Review Lab Results  Component Value Date   WBC 5.1 12/25/2017   HGB 10.8 (L) 12/25/2017   HCT 33.5 (L) 12/25/2017   MCV 91.5 12/25/2017   PLT 138 (L) 12/25/2017   Lab Results  Component Value Date   NA 138 12/25/2017   K 3.6 12/25/2017   CL 105 12/25/2017   CO2 24 12/25/2017   BUN 32 (H) 12/25/2017   CREATININE 1.41 (H) 12/25/2017   GLUCOSE 213 (H) 12/25/2017   Lab Results  Component Value Date   INR 1.04 12/25/2017    Assessment/Plan:  Estimated body mass index is 29.94 kg/m as calculated from the following:   Height as of this encounter: 5\' 3"  (1.6 m).   Weight as of this encounter: 76.7 kg (169 lb). Patient admitted for L 3-4 L4-5 decompression and L peroneal N decompression. Patient has failed a reasonable attempt at conservative therapy.  I explained the condition and procedure  to the patient and answered any questions.  Patient wishes to proceed with procedure as planned. Understands risks/ benefits and typical outcomes of procedure.   Glenette Bookwalter S 12/31/2017 3:01 PM

## 2017-12-31 NOTE — Anesthesia Postprocedure Evaluation (Signed)
Anesthesia Post Note  Patient: Tina Briggs  Procedure(s) Performed: Laminectomy and Foraminotomy - Lumbar Three-Four - left Extraforaminal diskectomy Lumbar Four-Five left (Left Spine Lumbar) Left PERONEAL NERVE DECOMPRESSION (Left Leg Lower)     Patient location during evaluation: PACU Anesthesia Type: General Level of consciousness: awake and alert Pain management: pain level controlled Vital Signs Assessment: post-procedure vital signs reviewed and stable Respiratory status: spontaneous breathing, nonlabored ventilation, respiratory function stable and patient connected to nasal cannula oxygen Cardiovascular status: blood pressure returned to baseline and stable Postop Assessment: no apparent nausea or vomiting Anesthetic complications: no    Last Vitals:  Vitals:   12/31/17 1930 12/31/17 1947  BP: 125/75 (!) 160/82  Pulse: 70 80  Resp: 19 16  Temp: 37 C 36.8 C  SpO2: 100% 96%    Last Pain:  Vitals:   12/31/17 2002  TempSrc:   PainSc: 3                  Adamarie Izzo P Levern Pitter

## 2018-01-01 ENCOUNTER — Encounter (HOSPITAL_COMMUNITY): Payer: Self-pay | Admitting: Neurological Surgery

## 2018-01-01 DIAGNOSIS — G629 Polyneuropathy, unspecified: Secondary | ICD-10-CM | POA: Diagnosis not present

## 2018-01-01 DIAGNOSIS — I10 Essential (primary) hypertension: Secondary | ICD-10-CM | POA: Diagnosis not present

## 2018-01-01 DIAGNOSIS — M21372 Foot drop, left foot: Secondary | ICD-10-CM | POA: Diagnosis not present

## 2018-01-01 DIAGNOSIS — M5416 Radiculopathy, lumbar region: Secondary | ICD-10-CM | POA: Diagnosis not present

## 2018-01-01 DIAGNOSIS — M48061 Spinal stenosis, lumbar region without neurogenic claudication: Secondary | ICD-10-CM | POA: Diagnosis not present

## 2018-01-01 DIAGNOSIS — M5126 Other intervertebral disc displacement, lumbar region: Secondary | ICD-10-CM | POA: Diagnosis not present

## 2018-01-01 LAB — GLUCOSE, CAPILLARY
GLUCOSE-CAPILLARY: 253 mg/dL — AB (ref 65–99)
Glucose-Capillary: 372 mg/dL — ABNORMAL HIGH (ref 65–99)

## 2018-01-01 MED ORDER — OXYCODONE HCL 5 MG PO TABS: 5.0000 mg | ORAL_TABLET | Freq: Four times a day (QID) | ORAL | 0 refills | Status: DC | PRN

## 2018-01-01 MED ORDER — LIVING WELL WITH DIABETES BOOK
Freq: Once | Status: AC
  Administered 2018-01-01: 12:00:00
  Filled 2018-01-01: qty 1

## 2018-01-01 MED FILL — Thrombin For Soln 5000 Unit: CUTANEOUS | Qty: 5000 | Status: AC

## 2018-01-01 NOTE — Discharge Summary (Signed)
Physician Discharge Summary  Patient ID: Tina Briggs MRN: [id number] DOB/AGE: [birth year] 72 y.o.  Admit date: 12/31/2017 Discharge date: 01/01/2018  Admission Diagnoses: L peroneal neuropathy, L3-4 L4-5 stenosis HNP    Discharge Diagnoses: same   Discharged Condition: good  Hospital Course: The patient was admitted on 12/31/2017 and taken to the operating room where the patient underwent L peroneal n decompression, L L3-4 L4-5 LL. The patient tolerated the procedure well and was taken to the recovery room and then to the floor in stable condition. The hospital course was routine. There were no complications. The wound remained clean dry and intact. Pt had appropriate back soreness. No complaints of leg pain or new N/T/W. The patient remained afebrile with stable vital signs, and tolerated a regular diet. The patient continued to increase activities, and pain was well controlled with oral pain medications.   Consults: None  Significant Diagnostic Studies:  Results for orders placed or performed during the hospital encounter of 12/31/17  Glucose, capillary  Result Value Ref Range   Glucose-Capillary 206 (H) 65 - 99 mg/dL   Comment 1 Notify RN   Glucose, capillary  Result Value Ref Range   Glucose-Capillary 293 (H) 65 - 99 mg/dL  Glucose, capillary  Result Value Ref Range   Glucose-Capillary 272 (H) 65 - 99 mg/dL  Glucose, capillary  Result Value Ref Range   Glucose-Capillary 238 (H) 65 - 99 mg/dL  Glucose, capillary  Result Value Ref Range   Glucose-Capillary 363 (H) 65 - 99 mg/dL  Glucose, capillary  Result Value Ref Range   Glucose-Capillary 253 (H) 65 - 99 mg/dL   Comment 1 Notify RN    Comment 2 Document in Chart   Glucose, capillary  Result Value Ref Range   Glucose-Capillary 372 (H) 65 - 99 mg/dL    Chest 2 View  Result Date: 12/25/2017 CLINICAL DATA:  Hypertension. EXAM: CHEST - 2 VIEW COMPARISON:  Radiographs of June 14, 2015. FINDINGS: The heart size  and mediastinal contours are within normal limits. Both lungs are clear. No pneumothorax or pleural effusion is noted. The visualized skeletal structures are unremarkable. IMPRESSION: No active cardiopulmonary disease. Electronically Signed   By: Marijo Conception, M.D.   On: 12/25/2017 16:47   Dg Lumbar Spine 1 View  Result Date: 12/31/2017 CLINICAL DATA:  72 y/o F; intraoperative cross-table lateral radiograph. EXAM: LUMBAR SPINE - 1 VIEW COMPARISON:  None. FINDINGS: Single lateral cross-table radiograph of the lumbar spine intraoperative. A probe is present projecting over posterior elements at the L3-4 intervertebral disc level. IMPRESSION: Probe projecting over posterior elements at L3-4 disc level. Electronically Signed   By: Kristine Garbe M.D.   On: 12/31/2017 16:58    Antibiotics:  Anti-infectives (From admission, onward)   Start     Dose/Rate Route Frequency Ordered Stop   01/01/18 0000  vancomycin (VANCOCIN) IVPB 1000 mg/200 mL premix     1,000 mg 200 mL/hr over 60 Minutes Intravenous  Once 12/31/17 1949 01/01/18 0231   12/31/17 1431  bacitracin 50,000 Units in sodium chloride irrigation 0.9 % 500 mL irrigation  Status:  Discontinued       As needed 12/31/17 1431 12/31/17 1741   12/31/17 1149  vancomycin (VANCOCIN) 1-5 GM/200ML-% IVPB  Status:  Discontinued    Note to Pharmacy:  Beverly Gust   : cabinet override      12/31/17 1149 12/31/17 1212   12/31/17 1125  vancomycin (VANCOCIN) IVPB 1000 mg/200 mL premix  1,000 mg 200 mL/hr over 60 Minutes Intravenous On call to O.R. 12/31/17 1125 12/31/17 1314      Discharge Exam: Blood pressure 133/62, pulse 64, temperature 97.9 F (36.6 C), temperature source Oral, resp. rate 18, height 5\' 3"  (1.6 m), weight 76.7 kg (169 lb), SpO2 99 %. Neurologic: Grossly normal incison CDI  Discharge Medications:   Allergies as of 01/01/2018      Reactions   Codeine Shortness Of Breath   Hydrocodone    Doesn't like to take    Tetracyclines & Related Itching   Azithromycin Itching, Rash   Erythromycin Itching, Rash   Morphine And Related Itching, Rash   Penicillins Itching, Rash   Has patient had a PCN reaction causing immediate rash, facial/tongue/throat swelling, SOB or lightheadedness with hypotension: Yes Has patient had a PCN reaction causing severe rash involving mucus membranes or skin necrosis: No Has patient had a PCN reaction that required hospitalization: No Has patient had a PCN reaction occurring within the last 10 years: No If all of the above answers are "NO", then may proceed with Cephalosporin use.   Septra [sulfamethoxazole-trimethoprim] Itching, Rash      Medication List    TAKE these medications   acetaminophen 500 MG tablet Commonly known as:  TYLENOL Take 1,000 mg by mouth 2 (two) times daily as needed for moderate pain or headache.   amLODipine 10 MG tablet Commonly known as:  NORVASC Take 10 mg by mouth daily.   aspirin EC 81 MG tablet Take 81 mg by mouth daily.   cholecalciferol 1000 units tablet Commonly known as:  VITAMIN D Take 1,000 Units by mouth daily.   CRESTOR 40 MG tablet Generic drug:  rosuvastatin TAKE 1 TABLET AT BEDTIME   cyclophosphamide 50 MG capsule Commonly known as:  CYTOXAN Take 150 mg by mouth daily. Give on an empty stomach 1 hour before or 2 hours after meals.   fluticasone 50 MCG/ACT nasal spray Commonly known as:  FLONASE Place 2 sprays into both nostrils daily as needed for allergies.   insulin aspart 100 UNIT/ML injection Commonly known as:  novoLOG Inject 10-38 Units into the skin 3 (three) times daily before meals.   LORazepam 0.5 MG tablet Commonly known as:  ATIVAN Take 0.5 mg by mouth at bedtime as needed for anxiety.   methocarbamol 750 MG tablet Commonly known as:  ROBAXIN Take 750 mg by mouth at bedtime as needed for muscle spasms.   oxyCODONE 5 MG immediate release tablet Commonly known as:  Oxy IR/ROXICODONE Take 1 tablet  (5 mg total) by mouth every 6 (six) hours as needed for moderate pain ((score 4 to 6)).   pantoprazole 40 MG tablet Commonly known as:  PROTONIX Take 40 mg by mouth daily.   predniSONE 20 MG tablet Commonly known as:  DELTASONE Take 20 mg by mouth daily.   TRESIBA FLEXTOUCH 100 UNIT/ML Sopn FlexTouch Pen Generic drug:  insulin degludec Inject 3 Units into the skin at bedtime.   venlafaxine XR 75 MG 24 hr capsule Commonly known as:  EFFEXOR-XR Take 75 mg by mouth every morning.   vitamin B-12 1000 MCG tablet Commonly known as:  CYANOCOBALAMIN Take 1,000 mcg by mouth 3 (three) times a week. Take on Sundays, Wednesdays and Fridays.       Disposition: home  Final Dx: LL, extrafoarminal diskectomy, L peroneal N decompression  Discharge Instructions     Remove dressing in 72 hours   Complete by:  As directed  Call MD for:  difficulty breathing, headache or visual disturbances   Complete by:  As directed    Call MD for:  persistant nausea and vomiting   Complete by:  As directed    Call MD for:  redness, tenderness, or signs of infection (pain, swelling, redness, odor or green/yellow discharge around incision site)   Complete by:  As directed    Call MD for:  severe uncontrolled pain   Complete by:  As directed    Call MD for:  temperature >100.4   Complete by:  As directed    Diet - low sodium heart healthy   Complete by:  As directed    Increase activity slowly   Complete by:  As directed          Signed: JONES,DAVID S 01/01/2018, 1:35 PM

## 2018-01-01 NOTE — Progress Notes (Signed)
Pt doing well. Pt given D/C instructions with Rx, verbal understanding was provided. Pt's incisions are clean and dry with no sign of infection. Pt's IV was removed prior to D/C. Pt D/C'd home via wheelchair per MD order. Pt is stable @ D/C and has no other needs at this time. Holli Humbles, RN

## 2018-01-01 NOTE — Progress Notes (Signed)
Inpatient Diabetes Program Recommendations  AACE/ADA: New Consensus Statement on Inpatient Glycemic Control (2015)  Target Ranges:  Prepandial:   less than 140 mg/dL      Peak postprandial:   less than 180 mg/dL (1-2 hours)      Critically ill patients:  140 - 180 mg/dL   Lab Results  Component Value Date   GLUCAP 253 (H) 01/01/2018   HGBA1C 7.6 (H) 12/25/2017    Review of Glycemic Control  Diabetes history: DM2 Outpatient Diabetes medications:Novolog 40 units ac breakfast + 40 units ac lunch + 30 units ac dinner Current orders for Inpatient glycemic control: Novolog moderate correction + hs  Inpatient Diabetes Program Recommendations:   Received consult per patient request-Patient had questions regarding nutrition. Patient sees endocrinologist Dr. Buddy Duty and has recently discontinued [place] and on meal coverage only. A1c 7.6. Reviewed basic nutrition information with patient and gave handouts on nutrition plate method and Q1J. Patient states she has no further questions @ this time.  Thank you, Nani Gasser. Aditya Nastasi, RN, MSN, CDE  Diabetes Coordinator Inpatient Glycemic Control Team Team Pager [phone] (8am-5pm) 01/01/2018 10:12 AM

## 2018-01-09 DIAGNOSIS — N052 Unspecified nephritic syndrome with diffuse membranous glomerulonephritis: Secondary | ICD-10-CM | POA: Diagnosis not present

## 2018-01-09 DIAGNOSIS — G5702 Lesion of sciatic nerve, left lower limb: Secondary | ICD-10-CM | POA: Diagnosis not present

## 2018-01-09 DIAGNOSIS — I129 Hypertensive chronic kidney disease with stage 1 through stage 4 chronic kidney disease, or unspecified chronic kidney disease: Secondary | ICD-10-CM | POA: Diagnosis not present

## 2018-01-09 DIAGNOSIS — M4800 Spinal stenosis, site unspecified: Secondary | ICD-10-CM | POA: Diagnosis not present

## 2018-01-09 DIAGNOSIS — D631 Anemia in chronic kidney disease: Secondary | ICD-10-CM | POA: Diagnosis not present

## 2018-01-09 DIAGNOSIS — N183 Chronic kidney disease, stage 3 (moderate): Secondary | ICD-10-CM | POA: Diagnosis not present

## 2018-01-09 DIAGNOSIS — E1122 Type 2 diabetes mellitus with diabetic chronic kidney disease: Secondary | ICD-10-CM | POA: Diagnosis not present

## 2018-01-12 DIAGNOSIS — E1165 Type 2 diabetes mellitus with hyperglycemia: Secondary | ICD-10-CM | POA: Diagnosis not present

## 2018-01-12 DIAGNOSIS — Z5181 Encounter for therapeutic drug level monitoring: Secondary | ICD-10-CM | POA: Diagnosis not present

## 2018-01-12 DIAGNOSIS — Z794 Long term (current) use of insulin: Secondary | ICD-10-CM | POA: Diagnosis not present

## 2018-01-19 DIAGNOSIS — Z6829 Body mass index (BMI) 29.0-29.9, adult: Secondary | ICD-10-CM | POA: Diagnosis not present

## 2018-01-19 DIAGNOSIS — M25552 Pain in left hip: Secondary | ICD-10-CM | POA: Diagnosis not present

## 2018-01-19 DIAGNOSIS — I10 Essential (primary) hypertension: Secondary | ICD-10-CM | POA: Diagnosis not present

## 2018-01-19 DIAGNOSIS — M21372 Foot drop, left foot: Secondary | ICD-10-CM | POA: Diagnosis not present

## 2018-01-30 DIAGNOSIS — Z8719 Personal history of other diseases of the digestive system: Secondary | ICD-10-CM | POA: Diagnosis not present

## 2018-01-30 DIAGNOSIS — R1032 Left lower quadrant pain: Secondary | ICD-10-CM | POA: Diagnosis not present

## 2018-02-17 ENCOUNTER — Other Ambulatory Visit: Payer: Self-pay | Admitting: Internal Medicine

## 2018-02-17 DIAGNOSIS — R609 Edema, unspecified: Secondary | ICD-10-CM

## 2018-02-17 DIAGNOSIS — R251 Tremor, unspecified: Secondary | ICD-10-CM | POA: Diagnosis not present

## 2018-02-17 DIAGNOSIS — R600 Localized edema: Secondary | ICD-10-CM | POA: Diagnosis not present

## 2018-02-19 ENCOUNTER — Ambulatory Visit
Admission: RE | Admit: 2018-02-19 | Discharge: 2018-02-19 | Disposition: A | Discharge status: Home / Self Care | Payer: Medicare Other | Source: Ambulatory Visit | Attending: Internal Medicine | Admitting: Internal Medicine

## 2018-02-19 DIAGNOSIS — R609 Edema, unspecified: Secondary | ICD-10-CM

## 2018-02-19 DIAGNOSIS — R600 Localized edema: Secondary | ICD-10-CM

## 2018-02-19 DIAGNOSIS — M7989 Other specified soft tissue disorders: Secondary | ICD-10-CM | POA: Diagnosis not present

## 2018-02-26 DIAGNOSIS — E1165 Type 2 diabetes mellitus with hyperglycemia: Secondary | ICD-10-CM | POA: Diagnosis not present

## 2018-02-26 DIAGNOSIS — Z794 Long term (current) use of insulin: Secondary | ICD-10-CM | POA: Diagnosis not present

## 2018-02-26 DIAGNOSIS — Z5181 Encounter for therapeutic drug level monitoring: Secondary | ICD-10-CM | POA: Diagnosis not present

## 2018-03-02 DIAGNOSIS — N052 Unspecified nephritic syndrome with diffuse membranous glomerulonephritis: Secondary | ICD-10-CM | POA: Diagnosis not present

## 2018-03-02 DIAGNOSIS — G5702 Lesion of sciatic nerve, left lower limb: Secondary | ICD-10-CM | POA: Diagnosis not present

## 2018-03-02 DIAGNOSIS — E1122 Type 2 diabetes mellitus with diabetic chronic kidney disease: Secondary | ICD-10-CM | POA: Diagnosis not present

## 2018-03-02 DIAGNOSIS — N183 Chronic kidney disease, stage 3 (moderate): Secondary | ICD-10-CM | POA: Diagnosis not present

## 2018-03-02 DIAGNOSIS — I129 Hypertensive chronic kidney disease with stage 1 through stage 4 chronic kidney disease, or unspecified chronic kidney disease: Secondary | ICD-10-CM | POA: Diagnosis not present

## 2018-03-02 DIAGNOSIS — M4800 Spinal stenosis, site unspecified: Secondary | ICD-10-CM | POA: Diagnosis not present

## 2018-03-02 DIAGNOSIS — D631 Anemia in chronic kidney disease: Secondary | ICD-10-CM | POA: Diagnosis not present

## 2018-03-06 DIAGNOSIS — L821 Other seborrheic keratosis: Secondary | ICD-10-CM | POA: Diagnosis not present

## 2018-03-06 DIAGNOSIS — D2272 Melanocytic nevi of left lower limb, including hip: Secondary | ICD-10-CM | POA: Diagnosis not present

## 2018-03-06 DIAGNOSIS — Z85828 Personal history of other malignant neoplasm of skin: Secondary | ICD-10-CM | POA: Diagnosis not present

## 2018-03-06 DIAGNOSIS — D2271 Melanocytic nevi of right lower limb, including hip: Secondary | ICD-10-CM | POA: Diagnosis not present

## 2018-03-06 DIAGNOSIS — D1801 Hemangioma of skin and subcutaneous tissue: Secondary | ICD-10-CM | POA: Diagnosis not present

## 2018-03-06 DIAGNOSIS — L650 Telogen effluvium: Secondary | ICD-10-CM | POA: Diagnosis not present

## 2018-03-06 DIAGNOSIS — D2261 Melanocytic nevi of right upper limb, including shoulder: Secondary | ICD-10-CM | POA: Diagnosis not present

## 2018-03-06 DIAGNOSIS — D225 Melanocytic nevi of trunk: Secondary | ICD-10-CM | POA: Diagnosis not present

## 2018-03-06 DIAGNOSIS — D2262 Melanocytic nevi of left upper limb, including shoulder: Secondary | ICD-10-CM | POA: Diagnosis not present

## 2018-03-24 DIAGNOSIS — R609 Edema, unspecified: Secondary | ICD-10-CM | POA: Diagnosis not present

## 2018-04-01 DIAGNOSIS — E1122 Type 2 diabetes mellitus with diabetic chronic kidney disease: Secondary | ICD-10-CM | POA: Diagnosis not present

## 2018-04-01 DIAGNOSIS — G5702 Lesion of sciatic nerve, left lower limb: Secondary | ICD-10-CM | POA: Diagnosis not present

## 2018-04-01 DIAGNOSIS — M4800 Spinal stenosis, site unspecified: Secondary | ICD-10-CM | POA: Diagnosis not present

## 2018-04-01 DIAGNOSIS — N052 Unspecified nephritic syndrome with diffuse membranous glomerulonephritis: Secondary | ICD-10-CM | POA: Diagnosis not present

## 2018-04-01 DIAGNOSIS — N183 Chronic kidney disease, stage 3 (moderate): Secondary | ICD-10-CM | POA: Diagnosis not present

## 2018-04-01 DIAGNOSIS — I129 Hypertensive chronic kidney disease with stage 1 through stage 4 chronic kidney disease, or unspecified chronic kidney disease: Secondary | ICD-10-CM | POA: Diagnosis not present

## 2018-04-01 DIAGNOSIS — D631 Anemia in chronic kidney disease: Secondary | ICD-10-CM | POA: Diagnosis not present

## 2018-04-06 DIAGNOSIS — H52203 Unspecified astigmatism, bilateral: Secondary | ICD-10-CM | POA: Diagnosis not present

## 2018-04-06 DIAGNOSIS — E119 Type 2 diabetes mellitus without complications: Secondary | ICD-10-CM | POA: Diagnosis not present

## 2018-04-06 DIAGNOSIS — H353131 Nonexudative age-related macular degeneration, bilateral, early dry stage: Secondary | ICD-10-CM | POA: Diagnosis not present

## 2018-04-06 DIAGNOSIS — H43813 Vitreous degeneration, bilateral: Secondary | ICD-10-CM | POA: Diagnosis not present

## 2018-04-10 DIAGNOSIS — R6889 Other general symptoms and signs: Secondary | ICD-10-CM | POA: Diagnosis not present

## 2018-04-10 DIAGNOSIS — Z794 Long term (current) use of insulin: Secondary | ICD-10-CM | POA: Diagnosis not present

## 2018-04-10 DIAGNOSIS — Z8639 Personal history of other endocrine, nutritional and metabolic disease: Secondary | ICD-10-CM | POA: Diagnosis not present

## 2018-04-10 DIAGNOSIS — E1165 Type 2 diabetes mellitus with hyperglycemia: Secondary | ICD-10-CM | POA: Diagnosis not present

## 2018-04-10 DIAGNOSIS — E611 Iron deficiency: Secondary | ICD-10-CM | POA: Diagnosis not present

## 2018-04-10 DIAGNOSIS — Z79899 Other long term (current) drug therapy: Secondary | ICD-10-CM | POA: Diagnosis not present

## 2018-04-21 DIAGNOSIS — M5416 Radiculopathy, lumbar region: Secondary | ICD-10-CM | POA: Diagnosis not present

## 2018-04-21 DIAGNOSIS — N183 Chronic kidney disease, stage 3 (moderate): Secondary | ICD-10-CM | POA: Diagnosis not present

## 2018-04-21 DIAGNOSIS — G5732 Lesion of lateral popliteal nerve, left lower limb: Secondary | ICD-10-CM | POA: Diagnosis not present

## 2018-04-21 DIAGNOSIS — M21372 Foot drop, left foot: Secondary | ICD-10-CM | POA: Diagnosis not present

## 2018-04-28 DIAGNOSIS — M21372 Foot drop, left foot: Secondary | ICD-10-CM | POA: Diagnosis not present

## 2018-05-04 DIAGNOSIS — N052 Unspecified nephritic syndrome with diffuse membranous glomerulonephritis: Secondary | ICD-10-CM | POA: Diagnosis not present

## 2018-05-04 DIAGNOSIS — M4800 Spinal stenosis, site unspecified: Secondary | ICD-10-CM | POA: Diagnosis not present

## 2018-05-04 DIAGNOSIS — G5702 Lesion of sciatic nerve, left lower limb: Secondary | ICD-10-CM | POA: Diagnosis not present

## 2018-05-04 DIAGNOSIS — I129 Hypertensive chronic kidney disease with stage 1 through stage 4 chronic kidney disease, or unspecified chronic kidney disease: Secondary | ICD-10-CM | POA: Diagnosis not present

## 2018-05-04 DIAGNOSIS — D631 Anemia in chronic kidney disease: Secondary | ICD-10-CM | POA: Diagnosis not present

## 2018-05-04 DIAGNOSIS — E1129 Type 2 diabetes mellitus with other diabetic kidney complication: Secondary | ICD-10-CM | POA: Diagnosis not present

## 2018-05-20 DIAGNOSIS — N183 Chronic kidney disease, stage 3 (moderate): Secondary | ICD-10-CM | POA: Diagnosis not present

## 2018-05-20 DIAGNOSIS — Z1389 Encounter for screening for other disorder: Secondary | ICD-10-CM | POA: Diagnosis not present

## 2018-05-20 DIAGNOSIS — M519 Unspecified thoracic, thoracolumbar and lumbosacral intervertebral disc disorder: Secondary | ICD-10-CM | POA: Diagnosis not present

## 2018-05-20 DIAGNOSIS — E782 Mixed hyperlipidemia: Secondary | ICD-10-CM | POA: Diagnosis not present

## 2018-05-20 DIAGNOSIS — Z8639 Personal history of other endocrine, nutritional and metabolic disease: Secondary | ICD-10-CM | POA: Diagnosis not present

## 2018-05-20 DIAGNOSIS — Z1159 Encounter for screening for other viral diseases: Secondary | ICD-10-CM | POA: Diagnosis not present

## 2018-05-20 DIAGNOSIS — F322 Major depressive disorder, single episode, severe without psychotic features: Secondary | ICD-10-CM | POA: Diagnosis not present

## 2018-05-20 DIAGNOSIS — E1122 Type 2 diabetes mellitus with diabetic chronic kidney disease: Secondary | ICD-10-CM | POA: Diagnosis not present

## 2018-05-20 DIAGNOSIS — R600 Localized edema: Secondary | ICD-10-CM | POA: Diagnosis not present

## 2018-05-20 DIAGNOSIS — Z0000 Encounter for general adult medical examination without abnormal findings: Secondary | ICD-10-CM | POA: Diagnosis not present

## 2018-05-20 DIAGNOSIS — E538 Deficiency of other specified B group vitamins: Secondary | ICD-10-CM | POA: Diagnosis not present

## 2018-05-20 DIAGNOSIS — I10 Essential (primary) hypertension: Secondary | ICD-10-CM | POA: Diagnosis not present

## 2018-05-20 DIAGNOSIS — K219 Gastro-esophageal reflux disease without esophagitis: Secondary | ICD-10-CM | POA: Diagnosis not present

## 2018-05-31 DIAGNOSIS — S9782XA Crushing injury of left foot, initial encounter: Secondary | ICD-10-CM | POA: Diagnosis not present

## 2018-05-31 DIAGNOSIS — S9032XA Contusion of left foot, initial encounter: Secondary | ICD-10-CM | POA: Diagnosis not present

## 2018-06-02 ENCOUNTER — Ambulatory Visit
Admission: RE | Admit: 2018-06-02 | Discharge: 2018-06-02 | Disposition: A | Discharge status: Home / Self Care | Payer: Medicare Other | Source: Ambulatory Visit | Attending: Nurse Practitioner | Admitting: Nurse Practitioner

## 2018-06-02 ENCOUNTER — Other Ambulatory Visit: Payer: Self-pay | Admitting: Nurse Practitioner

## 2018-06-02 DIAGNOSIS — M79672 Pain in left foot: Secondary | ICD-10-CM

## 2018-06-02 DIAGNOSIS — T1490XA Injury, unspecified, initial encounter: Secondary | ICD-10-CM

## 2018-06-02 DIAGNOSIS — N052 Unspecified nephritic syndrome with diffuse membranous glomerulonephritis: Secondary | ICD-10-CM | POA: Diagnosis not present

## 2018-06-02 DIAGNOSIS — S9032XA Contusion of left foot, initial encounter: Secondary | ICD-10-CM | POA: Diagnosis not present

## 2018-06-06 DIAGNOSIS — M79672 Pain in left foot: Secondary | ICD-10-CM | POA: Insufficient documentation

## 2018-06-09 DIAGNOSIS — N052 Unspecified nephritic syndrome with diffuse membranous glomerulonephritis: Secondary | ICD-10-CM | POA: Diagnosis not present

## 2018-06-25 DIAGNOSIS — K5792 Diverticulitis of intestine, part unspecified, without perforation or abscess without bleeding: Secondary | ICD-10-CM | POA: Diagnosis not present

## 2018-06-30 DIAGNOSIS — E1129 Type 2 diabetes mellitus with other diabetic kidney complication: Secondary | ICD-10-CM | POA: Diagnosis not present

## 2018-06-30 DIAGNOSIS — I129 Hypertensive chronic kidney disease with stage 1 through stage 4 chronic kidney disease, or unspecified chronic kidney disease: Secondary | ICD-10-CM | POA: Diagnosis not present

## 2018-06-30 DIAGNOSIS — M4800 Spinal stenosis, site unspecified: Secondary | ICD-10-CM | POA: Diagnosis not present

## 2018-06-30 DIAGNOSIS — G5702 Lesion of sciatic nerve, left lower limb: Secondary | ICD-10-CM | POA: Diagnosis not present

## 2018-06-30 DIAGNOSIS — N052 Unspecified nephritic syndrome with diffuse membranous glomerulonephritis: Secondary | ICD-10-CM | POA: Diagnosis not present

## 2018-06-30 DIAGNOSIS — N189 Chronic kidney disease, unspecified: Secondary | ICD-10-CM | POA: Diagnosis not present

## 2018-06-30 DIAGNOSIS — D631 Anemia in chronic kidney disease: Secondary | ICD-10-CM | POA: Diagnosis not present

## 2018-06-30 DIAGNOSIS — K5792 Diverticulitis of intestine, part unspecified, without perforation or abscess without bleeding: Secondary | ICD-10-CM | POA: Diagnosis not present

## 2018-07-07 DIAGNOSIS — M21372 Foot drop, left foot: Secondary | ICD-10-CM | POA: Diagnosis not present

## 2018-07-07 DIAGNOSIS — M5416 Radiculopathy, lumbar region: Secondary | ICD-10-CM | POA: Diagnosis not present

## 2018-07-13 DIAGNOSIS — Z23 Encounter for immunization: Secondary | ICD-10-CM | POA: Diagnosis not present

## 2018-07-13 DIAGNOSIS — Z794 Long term (current) use of insulin: Secondary | ICD-10-CM | POA: Diagnosis not present

## 2018-07-13 DIAGNOSIS — Z5181 Encounter for therapeutic drug level monitoring: Secondary | ICD-10-CM | POA: Diagnosis not present

## 2018-07-13 DIAGNOSIS — E1165 Type 2 diabetes mellitus with hyperglycemia: Secondary | ICD-10-CM | POA: Diagnosis not present

## 2018-07-29 DIAGNOSIS — M5416 Radiculopathy, lumbar region: Secondary | ICD-10-CM | POA: Diagnosis not present

## 2018-07-29 DIAGNOSIS — M48061 Spinal stenosis, lumbar region without neurogenic claudication: Secondary | ICD-10-CM | POA: Diagnosis not present

## 2018-08-04 ENCOUNTER — Other Ambulatory Visit (HOSPITAL_COMMUNITY): Payer: Self-pay | Admitting: Student

## 2018-08-04 DIAGNOSIS — M5416 Radiculopathy, lumbar region: Secondary | ICD-10-CM | POA: Diagnosis not present

## 2018-08-04 DIAGNOSIS — M7989 Other specified soft tissue disorders: Secondary | ICD-10-CM

## 2018-08-05 ENCOUNTER — Ambulatory Visit (HOSPITAL_COMMUNITY)
Admission: RE | Admit: 2018-08-05 | Discharge: 2018-08-05 | Disposition: A | Discharge status: Home / Self Care | Payer: Medicare Other | Source: Ambulatory Visit | Attending: Student | Admitting: Student

## 2018-08-05 DIAGNOSIS — M7989 Other specified soft tissue disorders: Secondary | ICD-10-CM | POA: Insufficient documentation

## 2018-08-06 ENCOUNTER — Other Ambulatory Visit: Payer: Self-pay | Admitting: Student

## 2018-08-06 DIAGNOSIS — M5416 Radiculopathy, lumbar region: Secondary | ICD-10-CM

## 2018-08-14 ENCOUNTER — Ambulatory Visit
Admission: RE | Admit: 2018-08-14 | Discharge: 2018-08-14 | Disposition: A | Discharge status: Home / Self Care | Payer: Medicare Other | Source: Ambulatory Visit | Attending: Student | Admitting: Student

## 2018-08-14 ENCOUNTER — Other Ambulatory Visit: Payer: Medicare Other

## 2018-08-14 DIAGNOSIS — M5416 Radiculopathy, lumbar region: Secondary | ICD-10-CM

## 2018-08-14 DIAGNOSIS — M545 Low back pain: Secondary | ICD-10-CM | POA: Diagnosis not present

## 2018-08-14 MED ORDER — IOPAMIDOL (ISOVUE-M 200) INJECTION 41%
1.0000 mL | Freq: Once | INTRAMUSCULAR | Status: AC
  Administered 2018-08-14: 1 mL via EPIDURAL

## 2018-08-14 MED ORDER — METHYLPREDNISOLONE ACETATE 40 MG/ML INJ SUSP (RADIOLOG
120.0000 mg | Freq: Once | INTRAMUSCULAR | Status: AC
  Administered 2018-08-14: 120 mg via EPIDURAL

## 2018-08-14 NOTE — Discharge Instructions (Signed)

## 2018-08-25 DIAGNOSIS — I10 Essential (primary) hypertension: Secondary | ICD-10-CM | POA: Diagnosis not present

## 2018-08-28 ENCOUNTER — Other Ambulatory Visit: Payer: Self-pay

## 2018-08-28 ENCOUNTER — Emergency Department (HOSPITAL_COMMUNITY): Payer: Medicare Other

## 2018-08-28 ENCOUNTER — Emergency Department (HOSPITAL_COMMUNITY)
Admission: EM | Admit: 2018-08-28 | Discharge: 2018-08-29 | Disposition: A | Discharge status: Home / Self Care | Payer: Medicare Other | Attending: Emergency Medicine | Admitting: Emergency Medicine

## 2018-08-28 ENCOUNTER — Encounter (HOSPITAL_COMMUNITY): Payer: Self-pay

## 2018-08-28 DIAGNOSIS — E119 Type 2 diabetes mellitus without complications: Secondary | ICD-10-CM | POA: Insufficient documentation

## 2018-08-28 DIAGNOSIS — M5489 Other dorsalgia: Secondary | ICD-10-CM | POA: Diagnosis not present

## 2018-08-28 DIAGNOSIS — I10 Essential (primary) hypertension: Secondary | ICD-10-CM | POA: Diagnosis not present

## 2018-08-28 DIAGNOSIS — M545 Low back pain: Secondary | ICD-10-CM | POA: Diagnosis not present

## 2018-08-28 DIAGNOSIS — M5442 Lumbago with sciatica, left side: Secondary | ICD-10-CM | POA: Insufficient documentation

## 2018-08-28 DIAGNOSIS — M5126 Other intervertebral disc displacement, lumbar region: Secondary | ICD-10-CM | POA: Diagnosis not present

## 2018-08-28 DIAGNOSIS — M549 Dorsalgia, unspecified: Secondary | ICD-10-CM | POA: Diagnosis not present

## 2018-08-28 DIAGNOSIS — M5416 Radiculopathy, lumbar region: Secondary | ICD-10-CM | POA: Diagnosis not present

## 2018-08-28 DIAGNOSIS — Z79899 Other long term (current) drug therapy: Secondary | ICD-10-CM | POA: Diagnosis not present

## 2018-08-28 DIAGNOSIS — R52 Pain, unspecified: Secondary | ICD-10-CM | POA: Diagnosis not present

## 2018-08-28 DIAGNOSIS — E785 Hyperlipidemia, unspecified: Secondary | ICD-10-CM | POA: Insufficient documentation

## 2018-08-28 MED ORDER — LIDOCAINE 5 % EX PTCH
1.0000 | MEDICATED_PATCH | CUTANEOUS | Status: DC
  Administered 2018-08-28: 1 via TRANSDERMAL
  Filled 2018-08-28: qty 1

## 2018-08-28 MED ORDER — FENTANYL CITRATE (PF) 100 MCG/2ML IJ SOLN
50.0000 ug | Freq: Once | INTRAMUSCULAR | Status: AC
  Administered 2018-08-28: 50 ug via INTRAVENOUS
  Filled 2018-08-28: qty 2

## 2018-08-28 MED ORDER — LIDOCAINE 5 % EX PTCH: 1.0000 | MEDICATED_PATCH | CUTANEOUS | 0 refills | Status: DC

## 2018-08-28 MED ORDER — METHOCARBAMOL 500 MG IVPB - SIMPLE MED
500.0000 mg | Freq: Once | INTRAVENOUS | Status: AC
Start: 2018-08-28 — End: 2018-08-28
  Administered 2018-08-28: 500 mg via INTRAVENOUS
  Filled 2018-08-28: qty 500

## 2018-08-28 MED ORDER — METHOCARBAMOL 1000 MG/10ML IJ SOLN: 500.0000 mg | Freq: Once | INTRAMUSCULAR | Status: DC

## 2018-08-28 MED ORDER — METHOCARBAMOL 500 MG PO TABS: 500.0000 mg | ORAL_TABLET | Freq: Two times a day (BID) | ORAL | 0 refills | Status: DC

## 2018-08-28 MED ORDER — KETOROLAC TROMETHAMINE 15 MG/ML IJ SOLN
7.5000 mg | Freq: Once | INTRAMUSCULAR | Status: AC
  Administered 2018-08-28: 7.5 mg via INTRAVENOUS
  Filled 2018-08-28: qty 1

## 2018-08-28 MED ORDER — HYDROMORPHONE HCL 1 MG/ML IJ SOLN
0.5000 mg | Freq: Once | INTRAMUSCULAR | Status: AC
  Administered 2018-08-28: 0.5 mg via INTRAVENOUS
  Filled 2018-08-28: qty 1

## 2018-08-28 NOTE — ED Notes (Signed)
Pt unable to ambulate, reporting severe pain.

## 2018-08-28 NOTE — ED Provider Notes (Signed)
[facility] DEPT Provider Note   CSN: [id number] Arrival date & time: 08/28/18  1611     History   Chief Complaint Chief Complaint  Patient presents with  . Back Pain    HPI Tina Briggs is a 72 y.o. female presenting for evaluation of back pain.  Patient states she has been having gradually increasing back pain over the past several days.  She was seen at a walk-in clinic today, during which she was given a prednisone shot and a prescription for Norco.  Symptoms were gradually improving until she went to sit up, felt a pop in her back, and had acute onset severe back pain.  It is midline, and radiates down her left leg.  Pain is constant, worse with movement.  It is not improved with the Norco she was given.  She denies fevers, chills, new numbness, loss of bowel bladder control.  Denies a history of cancer or IV drug use.  She denies fall or injury.  Patient reports a history of lumbar surgery in April with Dr. Ronnald Ramp.  She reports symptoms improved after surgery until recently.  She denies chest pain, shortness of breath, nausea, vomiting, abdominal pain, dysuria, hematuria, urinary frequency, or abnormal bowel movements.  Patient states she had an injection in her back on the right side a couple weeks ago, has had no issues on the right side since.  HPI  Past Medical History:  Diagnosis Date  . Anemia   . Aspiration pneumonia ([place])    after common bile duct obstruction  . CATARACT, RIGHT EYE    had repaired  . Depression   . DM   . GERD   . Glomerulonephritis 2019  . HYPERLIPIDEMIA-MIXED   . HYPERTENSION, UNSPECIFIED   . OBESITY   . PONV (postoperative nausea and vomiting)    past history only    Patient Active Problem List   Diagnosis Date Noted  . S/P lumbar laminectomy 12/31/2017  . Elevated serum immunoglobulin free light chain level 08/26/2017  . Chronic kidney disease, stage 3 ([place]) 06/07/2015  . Choledocholithiasis  06/07/2015  . Aspiration pneumonitis ([place]) 06/06/2015  . Anemia 06/06/2015  . Diabetes mellitus type 2 in obese ([place]) 05/25/2015  . Elevated liver enzymes 05/24/2015  . RUQ abdominal pain 05/24/2015  . Hypercholesterolemia 10/27/2014  . HYPERLIPIDEMIA-MIXED 02/15/2009  . Obesity 02/15/2009  . CATARACT, RIGHT EYE 02/15/2009  . Essential hypertension 02/15/2009  . GERD 02/15/2009    Past Surgical History:  Procedure Laterality Date  . ABDOMINAL HYSTERECTOMY    . APPENDECTOMY    . BACK SURGERY     lumbar '09"microdiscectomy"  . CHOLECYSTECTOMY     open  . COLONOSCOPY WITH PROPOFOL N/A 11/22/2014   Procedure: COLONOSCOPY WITH PROPOFOL;  Surgeon: Garlan Fair, MD;  Location: [facility];  Service: Endoscopy;  Laterality: N/A;  . ERCP N/A 05/26/2015   Procedure: ENDOSCOPIC RETROGRADE CHOLANGIOPANCREATOGRAPHY (ERCP)   (DOING CASE IN MAIN OR);  Surgeon: Teena Irani, MD;  Location: [facility];  Service: Gastroenterology;  Laterality: N/A;  . ERCP N/A 06/06/2015   Procedure: ENDOSCOPIC RETROGRADE CHOLANGIOPANCREATOGRAPHY (ERCP);  Surgeon: Clarene Essex, MD;  Location: [facility];  Service: Endoscopy;  Laterality: N/A;  . ESOPHAGOGASTRODUODENOSCOPY (EGD) WITH PROPOFOL N/A 11/22/2014   Procedure: ESOPHAGOGASTRODUODENOSCOPY (EGD) WITH PROPOFOL;  Surgeon: Garlan Fair, MD;  Location: [facility];  Service: Endoscopy;  Laterality: N/A;  . EYE SURGERY Bilateral    cataracts  . LUMBAR LAMINECTOMY/DECOMPRESSION MICRODISCECTOMY Left 12/31/2017   Procedure:  Laminectomy and Foraminotomy - Lumbar Three-Four - left Extraforaminal diskectomy Lumbar Four-Five left;  Surgeon: Eustace Moore, MD;  Location: [facility];  Service: Neurosurgery;  Laterality: Left;  . PERONEAL NERVE DECOMPRESSION Left 12/31/2017   Procedure: Left PERONEAL NERVE DECOMPRESSION;  Surgeon: Eustace Moore, MD;  Location: [facility];  Service: Neurosurgery;  Laterality: Left;  left  . TUBAL LIGATION       OB History   None      Home  Medications    Prior to Admission medications   Medication Sig Start Date End Date Taking? Authorizing Provider  acetaminophen (TYLENOL) 500 MG tablet Take 1,000 mg by mouth 2 (two) times daily as needed for moderate pain or headache.     [provider]  amLODipine (NORVASC) 10 MG tablet Take 10 mg by mouth daily.    [provider]  aspirin EC 81 MG tablet Take 81 mg by mouth daily.    [provider]  cholecalciferol (VITAMIN D) 1000 units tablet Take 1,000 Units by mouth daily.    [provider]  CRESTOR 40 MG tablet TAKE 1 TABLET AT BEDTIME 04/19/13   Wall, Marijo Conception, MD  cyclophosphamide (CYTOXAN) 50 MG capsule Take 150 mg by mouth daily. Give on an empty stomach 1 hour before or 2 hours after meals.    [provider]  fluticasone (FLONASE) 50 MCG/ACT nasal spray Place 2 sprays into both nostrils daily as needed for allergies.     [provider]  insulin aspart (NOVOLOG) 100 UNIT/ML injection Inject 10-38 Units into the skin 3 (three) times daily before meals.    [provider]  insulin degludec (TRESIBA FLEXTOUCH) 100 UNIT/ML SOPN FlexTouch Pen Inject 3 Units into the skin at bedtime.    [provider]  lidocaine (LIDODERM) 5 % Place 1 patch onto the skin daily. Remove & Discard patch within 12 hours or as directed by MD 08/28/18   Catheleen Langhorne, PA-C  LORazepam (ATIVAN) 0.5 MG tablet Take 0.5 mg by mouth at bedtime as needed for anxiety.    [provider]  methocarbamol (ROBAXIN) 500 MG tablet Take 1 tablet (500 mg total) by mouth 2 (two) times daily. 08/28/18   Gershom Brobeck, PA-C  oxyCODONE (OXY IR/ROXICODONE) 5 MG immediate release tablet Take 1 tablet (5 mg total) by mouth every 6 (six) hours as needed for moderate pain ((score 4 to 6)). 01/01/18   Eustace Moore, MD  pantoprazole (PROTONIX) 40 MG tablet Take 40 mg by mouth daily.    [provider]  predniSONE (DELTASONE) 20 MG tablet  Take 20 mg by mouth daily.    [provider]  venlafaxine XR (EFFEXOR-XR) 75 MG 24 hr capsule Take 75 mg by mouth every morning.     [provider]  vitamin B-12 (CYANOCOBALAMIN) 1000 MCG tablet Take 1,000 mcg by mouth 3 (three) times a week. Take on Sundays, Wednesdays and Fridays.    [provider]    Family History Family History  Problem Relation Age of Onset  . Cancer Maternal Aunt        breast cancer  . Cancer Maternal Grandmother        kidney cancer   . Cancer Paternal Grandmother        bone cancer     Social History Social History   Tobacco Use  . Smoking status: Never Smoker  . Smokeless tobacco: Never Used  Substance Use Topics  . Alcohol use: No  .  Drug use: No     Allergies   Codeine; Hydrocodone; Tetracyclines & related; Azithromycin; Erythromycin; Morphine and related; Penicillins; and Septra [sulfamethoxazole-trimethoprim]   Review of Systems Review of Systems  Musculoskeletal: Positive for back pain.  All other systems reviewed and are negative.    Physical Exam Updated Vital Signs BP (!) 152/70 (BP Location: Left Arm)   Pulse 95   Temp 98 F (36.7 C) (Oral)   Resp 20   Ht 5\' 3"  (1.6 m)   Wt 78 kg   SpO2 98%   BMI 30.47 kg/m   Physical Exam  Constitutional: She is oriented to person, place, and time. She appears well-developed and well-nourished. No distress.  Elderly female lying on her side in no acute distress  HENT:  Head: Normocephalic and atraumatic.  Eyes: Pupils are equal, round, and reactive to light. Conjunctivae and EOM are normal.  Neck: Normal range of motion. Neck supple.  Cardiovascular: Normal rate, regular rhythm and intact distal pulses.  Pulmonary/Chest: Effort normal and breath sounds normal. No respiratory distress. She has no wheezes.  Abdominal: Soft. She exhibits no distension and no mass. There is no tenderness. There is no rebound and no guarding.  Musculoskeletal: Normal range of  motion. She exhibits tenderness.  Tenderness palpation of lumbar spine and left low back musculature.  Pain extends over the L buttock.  Pedal pulses intact bilaterally.  Sensation slightly decreased in the left side, patient states this is baseline. No saddle paresthesias. No ttp of the R side.   Neurological: She is alert and oriented to person, place, and time. No sensory deficit.  Skin: Skin is warm and dry. Capillary refill takes less than 2 seconds.  Psychiatric: She has a normal mood and affect.  Nursing note and vitals reviewed.    ED Treatments / Results  Labs (all labs ordered are listed, but only abnormal results are displayed) Labs Reviewed - No data to display  EKG None  Radiology Ct Lumbar Spine Wo Contrast  Result Date: 08/28/2018 CLINICAL DATA:  Left-sided back pain EXAM: CT LUMBAR SPINE WITHOUT CONTRAST TECHNIQUE: Multidetector CT imaging of the lumbar spine was performed without intravenous contrast administration. Multiplanar CT image reconstructions were also generated. COMPARISON:  None. FINDINGS: Segmentation: 5 lumbar type vertebrae. Alignment: Normal. Vertebrae: No acute fracture or focal pathologic process. Paraspinal and other soft tissues: Negative. Disc levels: T12-L1: Unremarkable. L1-2: Unremarkable. L2-3: Mild facet hypertrophy. No spinal canal or neural foraminal stenosis. L3-L4: Disc space narrowing with small disc bulge. Mild facet hypertrophy. No spinal canal stenosis. L4-5: Left foraminal disc protrusion with encroachment on the exiting left L4 nerve root. No central spinal canal or right neural foraminal stenosis. L5-S1: Unremarkable. IMPRESSION: Left foraminal disc protrusion at L4-5 with encroachment on the exiting left L4 nerve root. Electronically Signed   By: Ulyses Jarred M.D.   On: 08/28/2018 21:54    Procedures Procedures (including critical care time)  Medications Ordered in ED Medications  lidocaine (LIDODERM) 5 % 1 patch (1 patch  Transdermal Patch Applied 08/28/18 2201)  fentaNYL (SUBLIMAZE) injection 50 mcg (50 mcg Intravenous Given 08/28/18 2154)  methocarbamol (ROBAXIN) 500 mg in dextrose 5 % 50 mL IVPB (0 mg Intravenous Stopped 08/28/18 2226)  HYDROmorphone (DILAUDID) injection 0.5 mg (0.5 mg Intravenous Given 08/28/18 2300)  ketorolac (TORADOL) 15 MG/ML injection 7.5 mg (7.5 mg Intravenous Given 08/28/18 2300)     Initial Impression / Assessment and Plan / ED Course  I have reviewed the triage vital signs and  the nursing notes.  Pertinent labs & imaging results that were available during my care of the patient were reviewed by me and considered in my medical decision making (see chart for details).     Patient presenting for evaluation of back pain.  Physical exam without red flags of back pain.  However, considering patient's age and previous surgeries, will obtain CT for further evaluation.  Patient had prednisone shot today, will hold off on further steroids.  Robaxin, Lidoderm patch, and fentanyl given for symptom control.  CT shows HNP of the L4-L5 space with narrowing of the L4 nerve. Without new neuro sxs, and pain reproducible along the sciatic nerve, doubt cauda equina syndrome. I do not believe pt needs MRI.   Discussed findings with patient.  On reassessment, patient reports pain is improved when at rest.  Will attempt to ambulate. Case discussed with attending, Dr. Ralene Bathe evaluated the pt.   Patient could not go from lying to sitting in the bed due to severe pain.  Will give Toradol, Dilaudid, heating pad, and attempt again.  On repeat attempt, patient was able to stand and take a few steps without ambulatory assistant device.  Discussed with patient her comfort level of discharge.  Patient states if she wants to try to go home with management at home and follow-up outpatient with her neurosurgeon. Discussed likely course of continued pain with gradual improvement. Will d/c with robaxin and lidocaine  patches. Pt to continue prednisone and norco at home. At this time, pt appears safe for d/c. Return precautions given. Pt and husband state they understand and agree to plan.    Final Clinical Impressions(s) / ED Diagnoses   Final diagnoses:  Acute left-sided low back pain with left-sided sciatica  Herniation of left side of L4-L5 intervertebral disc    ED Discharge Orders         Ordered    methocarbamol (ROBAXIN) 500 MG tablet  2 times daily     08/28/18 2344    lidocaine (LIDODERM) 5 %  Every 24 hours     08/28/18 2344           Franchot Heidelberg, PA-C 08/28/18 2345    Quintella Reichert, MD 08/29/18 1501

## 2018-08-28 NOTE — ED Triage Notes (Addendum)
Per EMS- Patient c/o lower back pain x 2 days that radiates down the left leg. Patient had back surgery x 8 months. Patient was given Tylenol x 3 tabs prior to arrival to the hospital.  In triage Patient states she went to a walk in clinic today and received Hydrocodone and Prednisone which she got some relief. Patient states she went home and felt like something popped and has had severe pain since.

## 2018-08-28 NOTE — Discharge Instructions (Signed)
Continue taking home medications as prescribed. Take prednisone as prescribed at the left knee clinic.  Use Norco as prescribed for pain. Use Robaxin for further pain control.  This is a muscle relaxer, may make you tired or groggy. Use Lidoderm patches for further pain control.  Use heat to help relax the muscles. Try simple stretches provided in the paperwork. Follow-up with your back doctor for further evaluation management of your back. Return to the emergency room with any new, worsening, concerning symptoms.  Return if you develop high fevers, loss of bowel bladder control, new/persistent numbness.

## 2018-08-31 ENCOUNTER — Other Ambulatory Visit: Payer: Self-pay | Admitting: Neurological Surgery

## 2018-08-31 DIAGNOSIS — M5126 Other intervertebral disc displacement, lumbar region: Secondary | ICD-10-CM | POA: Insufficient documentation

## 2018-09-08 NOTE — Pre-Procedure Instructions (Signed)
Tina Briggs  09/08/2018      [facility] [address] [place], [place] - [address] [address] Phone: [phone] Fax: [phone]    Your procedure is scheduled on September 16, 2018.  Report to [facility] Admitting at 1110 AM.  Call this number if you have problems the morning of surgery:  [phone]   Remember:  Do not eat or drink after midnight.    Take these medicines the morning of surgery with A SIP OF WATER  Tylenol-if needed Amlodipine (Norvasc) Flonase nasal spray Methocarbamol (robaxin)-if needed Oxycodone-if needed for pain protonix (pantoprazole) Prednisone (deltasone) Venlafaxine XR (effexor-XR)  Follow your surgeon's instructions on when to hold/resume aspirin.  If no instructions were given call the office to determine how they would like to you take aspirin  7 days prior to surgery STOP taking any Aleve, Naproxen, Ibuprofen, Motrin, Advil, Goody's, BC's, all herbal medications, fish oil, and all vitamins   WHAT DO I DO ABOUT MY DIABETES MEDICATION?  Marland Kitchen Do not take oral diabetes medicines (pills) the morning of surgery.  . THE NIGHT BEFORE SURGERY, take your normal dose of  novolog insulin with dinner.       . THE MORNING OF SURGERY, take no Novolog insulin unless your CBG is greater than 220 mg/dL, then you may take  of your sliding scale (correction) dose of insulin- (9-17 units)  . If your CBG is greater than 220 mg/dL, you may take  of your sliding scale (correction) dose of insulin.  Reviewed and Endorsed by [facility] Patient Education Committee, August 2015   How to Manage Your Diabetes Before and After Surgery  Why is it important to control my blood sugar before and after surgery? . Improving blood sugar levels before and after surgery helps healing and can limit problems. . A way of improving blood sugar control is eating a healthy diet by: o  Eating less sugar  and carbohydrates o  Increasing activity/exercise o  Talking with your doctor about reaching your blood sugar goals . High blood sugars (greater than 180 mg/dL) can raise your risk of infections and slow your recovery, so you will need to focus on controlling your diabetes during the weeks before surgery. . Make sure that the doctor who takes care of your diabetes knows about your planned surgery including the date and location.  How do I manage my blood sugar before surgery? . Check your blood sugar at least 4 times a day, starting 2 days before surgery, to make sure that the level is not too high or low. o Check your blood sugar the morning of your surgery when you wake up and every 2 hours until you get to the Short Stay unit. . If your blood sugar is less than 70 mg/dL, you will need to treat for low blood sugar: o Do not take insulin. o Treat a low blood sugar (less than 70 mg/dL) with  cup of clear juice (cranberry or apple), 4 glucose tablets, OR glucose gel. Recheck blood sugar in 15 minutes after treatment (to make sure it is greater than 70 mg/dL). If your blood sugar is not greater than 70 mg/dL on recheck, call [phone] o  for further instructions. . Report your blood sugar to the short stay nurse when you get to Short Stay.  . If you are admitted to the hospital after surgery: o Your blood sugar will be checked by  the staff and you will probably be given insulin after surgery (instead of oral diabetes medicines) to make sure you have good blood sugar levels. o The goal for blood sugar control after surgery is 80-180 mg/dL.   [facility]- Preparing For Surgery  Before surgery, you can play an important role. Because skin is not sterile, your skin needs to be as free of germs as possible. You can reduce the number of germs on your skin by washing with CHG (chlorahexidine gluconate) Soap before surgery.  CHG is an antiseptic cleaner which kills germs and bonds with the skin to  continue killing germs even after washing.    Oral Hygiene is also important to reduce your risk of infection.  Remember - BRUSH YOUR TEETH THE MORNING OF SURGERY WITH YOUR REGULAR TOOTHPASTE  Please do not use if you have an allergy to CHG or antibacterial soaps. If your skin becomes reddened/irritated stop using the CHG.  Do not shave (including legs and underarms) for at least 48 hours prior to first CHG shower. It is OK to shave your face.  Please follow these instructions carefully.   1. Shower the NIGHT BEFORE SURGERY and the MORNING OF SURGERY with CHG.   2. If you chose to wash your hair, wash your hair first as usual with your normal shampoo.  3. After you shampoo, rinse your hair and body thoroughly to remove the shampoo.  4. Use CHG as you would any other liquid soap. You can apply CHG directly to the skin and wash gently with a scrungie or a clean washcloth.   5. Apply the CHG Soap to your body ONLY FROM THE NECK DOWN.  Do not use on open wounds or open sores. Avoid contact with your eyes, ears, mouth and genitals (private parts). Wash Face and genitals (private parts)  with your normal soap.  6. Wash thoroughly, paying special attention to the area where your surgery will be performed.  7. Thoroughly rinse your body with warm water from the neck down.  8. DO NOT shower/wash with your normal soap after using and rinsing off the CHG Soap.  9. Pat yourself dry with a CLEAN TOWEL.  10. Wear CLEAN PAJAMAS to bed the night before surgery, wear comfortable clothes the morning of surgery  11. Place CLEAN SHEETS on your bed the night of your first shower and DO NOT SLEEP WITH PETS.  Day of Surgery:  Do not apply any deodorants/lotions.  Please wear clean clothes to the hospital/surgery center.   Remember to brush your teeth WITH YOUR REGULAR TOOTHPASTE.   Do not wear jewelry, make-up or nail polish.  Do not wear lotions, powders, or perfumes, or deodorant.  Do not shave 48  hours prior to surgery.    Do not bring valuables to the hospital.  [facility] is not responsible for any belongings or valuables.  Contacts, dentures or bridgework may not be worn into surgery.  Leave your suitcase in the car.  After surgery it may be brought to your room.  For patients admitted to the hospital, discharge time will be determined by your treatment team.  Patients discharged the day of surgery will not be allowed to drive home.    Please read over the following fact sheets that you were given.

## 2018-09-09 ENCOUNTER — Other Ambulatory Visit: Payer: Self-pay

## 2018-09-09 ENCOUNTER — Encounter (HOSPITAL_COMMUNITY)
Admission: RE | Admit: 2018-09-09 | Discharge: 2018-09-09 | Disposition: A | Discharge status: Home / Self Care | Payer: Medicare Other | Source: Ambulatory Visit | Attending: Pediatrics | Admitting: Pediatrics

## 2018-09-09 ENCOUNTER — Encounter (HOSPITAL_COMMUNITY): Payer: Self-pay

## 2018-09-09 DIAGNOSIS — Z01812 Encounter for preprocedural laboratory examination: Secondary | ICD-10-CM | POA: Insufficient documentation

## 2018-09-09 LAB — CBC WITH DIFFERENTIAL/PLATELET
Abs Immature Granulocytes: 0.12 10*3/uL — ABNORMAL HIGH (ref 0.00–0.07)
Basophils Absolute: 0 10*3/uL (ref 0.0–0.1)
Basophils Relative: 0 %
Eosinophils Absolute: 0 10*3/uL (ref 0.0–0.5)
Eosinophils Relative: 0 %
HCT: 44 % (ref 36.0–46.0)
Hemoglobin: 14.6 g/dL (ref 12.0–15.0)
IMMATURE GRANULOCYTES: 1 %
LYMPHS ABS: 0.5 10*3/uL — AB (ref 0.7–4.0)
Lymphocytes Relative: 6 %
MCH: 29 pg (ref 26.0–34.0)
MCHC: 33.2 g/dL (ref 30.0–36.0)
MCV: 87.3 fL (ref 80.0–100.0)
Monocytes Absolute: 0.5 10*3/uL (ref 0.1–1.0)
Monocytes Relative: 5 %
NEUTROS ABS: 7.6 10*3/uL (ref 1.7–7.7)
Neutrophils Relative %: 88 %
Platelets: 191 10*3/uL (ref 150–400)
RBC: 5.04 MIL/uL (ref 3.87–5.11)
RDW: 13.2 % (ref 11.5–15.5)
WBC: 8.7 10*3/uL (ref 4.0–10.5)
nRBC: 0 % (ref 0.0–0.2)

## 2018-09-09 LAB — TYPE AND SCREEN
ABO/RH(D): B NEG
Antibody Screen: NEGATIVE

## 2018-09-09 LAB — BASIC METABOLIC PANEL
Anion gap: 12 (ref 5–15)
BUN: 32 mg/dL — ABNORMAL HIGH (ref 8–23)
CHLORIDE: 102 mmol/L (ref 98–111)
CO2: 24 mmol/L (ref 22–32)
Calcium: 9.1 mg/dL (ref 8.9–10.3)
Creatinine, Ser: 1.45 mg/dL — ABNORMAL HIGH (ref 0.44–1.00)
GFR calc Af Amer: 42 mL/min — ABNORMAL LOW (ref >60)
GFR calc non Af Amer: 36 mL/min — ABNORMAL LOW (ref >60)
Glucose, Bld: 255 mg/dL — ABNORMAL HIGH (ref 70–99)
Potassium: 3.9 mmol/L (ref 3.5–5.1)
Sodium: 138 mmol/L (ref 135–145)

## 2018-09-09 LAB — GLUCOSE, CAPILLARY: Glucose-Capillary: 221 mg/dL — ABNORMAL HIGH (ref 70–99)

## 2018-09-09 LAB — SURGICAL PCR SCREEN
MRSA, PCR: NEGATIVE
STAPHYLOCOCCUS AUREUS: NEGATIVE

## 2018-09-09 LAB — HEMOGLOBIN A1C
Hgb A1c MFr Bld: 6.8 % — ABNORMAL HIGH (ref 4.8–5.6)
MEAN PLASMA GLUCOSE: 148.46 mg/dL

## 2018-09-09 LAB — PROTIME-INR
INR: 0.97
Prothrombin Time: 12.8 seconds (ref 11.4–15.2)

## 2018-09-09 NOTE — Progress Notes (Signed)
PCP - Dr Deforest Hoyles Endocrinologist- Dr Buddy Duty Cardiologist - denies  Chest x-ray - 12/25/17 EKG - 12/25/17 Stress Test - 2016 ECHO - 2002  Fasting Blood Sugar - 220 today, usually 180-200 recently d/t steroid use. A1c drawn today.   Aspirin Instructions:pt states she has a sheet at home from Dr. Ronnald Ramp that should tell her this information  Patient denies shortness of breath, fever, cough and chest pain at PAT appointment   Patient verbalized understanding of instructions that were given to them at the PAT appointment. Patient was also instructed that they will need to review over the PAT instructions again at home before surgery.

## 2018-09-16 ENCOUNTER — Other Ambulatory Visit: Payer: Self-pay

## 2018-09-16 ENCOUNTER — Inpatient Hospital Stay (HOSPITAL_COMMUNITY): Payer: Medicare Other

## 2018-09-16 ENCOUNTER — Encounter (HOSPITAL_COMMUNITY)
Admission: RE | Disposition: A | Discharge status: Home / Self Care | Payer: Self-pay | Source: Home / Self Care | Attending: Neurological Surgery

## 2018-09-16 ENCOUNTER — Inpatient Hospital Stay (HOSPITAL_COMMUNITY)
Admission: RE | Admit: 2018-09-16 | Discharge: 2018-09-17 | DRG: 455 | Disposition: A | Discharge status: Home / Self Care | Payer: Medicare Other | Attending: Neurological Surgery | Admitting: Neurological Surgery

## 2018-09-16 ENCOUNTER — Inpatient Hospital Stay (HOSPITAL_COMMUNITY): Payer: Medicare Other | Admitting: Anesthesiology

## 2018-09-16 ENCOUNTER — Encounter (HOSPITAL_COMMUNITY): Payer: Self-pay

## 2018-09-16 DIAGNOSIS — E119 Type 2 diabetes mellitus without complications: Secondary | ICD-10-CM | POA: Diagnosis present

## 2018-09-16 DIAGNOSIS — M5126 Other intervertebral disc displacement, lumbar region: Principal | ICD-10-CM | POA: Diagnosis present

## 2018-09-16 DIAGNOSIS — E1122 Type 2 diabetes mellitus with diabetic chronic kidney disease: Secondary | ICD-10-CM | POA: Diagnosis not present

## 2018-09-16 DIAGNOSIS — Z881 Allergy status to other antibiotic agents status: Secondary | ICD-10-CM | POA: Diagnosis not present

## 2018-09-16 DIAGNOSIS — E782 Mixed hyperlipidemia: Secondary | ICD-10-CM | POA: Diagnosis not present

## 2018-09-16 DIAGNOSIS — K219 Gastro-esophageal reflux disease without esophagitis: Secondary | ICD-10-CM | POA: Diagnosis not present

## 2018-09-16 DIAGNOSIS — Z888 Allergy status to other drugs, medicaments and biological substances status: Secondary | ICD-10-CM | POA: Diagnosis not present

## 2018-09-16 DIAGNOSIS — Z794 Long term (current) use of insulin: Secondary | ICD-10-CM

## 2018-09-16 DIAGNOSIS — F329 Major depressive disorder, single episode, unspecified: Secondary | ICD-10-CM | POA: Diagnosis present

## 2018-09-16 DIAGNOSIS — I10 Essential (primary) hypertension: Secondary | ICD-10-CM | POA: Diagnosis present

## 2018-09-16 DIAGNOSIS — Z90710 Acquired absence of both cervix and uterus: Secondary | ICD-10-CM | POA: Diagnosis not present

## 2018-09-16 DIAGNOSIS — Z885 Allergy status to narcotic agent status: Secondary | ICD-10-CM

## 2018-09-16 DIAGNOSIS — Z7982 Long term (current) use of aspirin: Secondary | ICD-10-CM

## 2018-09-16 DIAGNOSIS — Z7952 Long term (current) use of systemic steroids: Secondary | ICD-10-CM

## 2018-09-16 DIAGNOSIS — Z9842 Cataract extraction status, left eye: Secondary | ICD-10-CM

## 2018-09-16 DIAGNOSIS — Z9841 Cataract extraction status, right eye: Secondary | ICD-10-CM | POA: Diagnosis not present

## 2018-09-16 DIAGNOSIS — Z880 Allergy status to penicillin: Secondary | ICD-10-CM

## 2018-09-16 DIAGNOSIS — Z79899 Other long term (current) drug therapy: Secondary | ICD-10-CM | POA: Diagnosis not present

## 2018-09-16 DIAGNOSIS — M5116 Intervertebral disc disorders with radiculopathy, lumbar region: Secondary | ICD-10-CM | POA: Diagnosis not present

## 2018-09-16 DIAGNOSIS — N183 Chronic kidney disease, stage 3 (moderate): Secondary | ICD-10-CM | POA: Diagnosis not present

## 2018-09-16 DIAGNOSIS — Z9049 Acquired absence of other specified parts of digestive tract: Secondary | ICD-10-CM | POA: Diagnosis not present

## 2018-09-16 DIAGNOSIS — M48061 Spinal stenosis, lumbar region without neurogenic claudication: Secondary | ICD-10-CM | POA: Diagnosis present

## 2018-09-16 DIAGNOSIS — Z981 Arthrodesis status: Secondary | ICD-10-CM

## 2018-09-16 DIAGNOSIS — Z419 Encounter for procedure for purposes other than remedying health state, unspecified: Secondary | ICD-10-CM

## 2018-09-16 DIAGNOSIS — M4326 Fusion of spine, lumbar region: Secondary | ICD-10-CM | POA: Diagnosis not present

## 2018-09-16 DIAGNOSIS — Z7951 Long term (current) use of inhaled steroids: Secondary | ICD-10-CM

## 2018-09-16 DIAGNOSIS — M532X6 Spinal instabilities, lumbar region: Secondary | ICD-10-CM | POA: Diagnosis not present

## 2018-09-16 HISTORY — PX: LUMBAR FUSION: SHX111

## 2018-09-16 HISTORY — DX: Other intervertebral disc displacement, lumbar region: M51.26

## 2018-09-16 LAB — GLUCOSE, CAPILLARY
Glucose-Capillary: 158 mg/dL — ABNORMAL HIGH (ref 70–99)
Glucose-Capillary: 168 mg/dL — ABNORMAL HIGH (ref 70–99)
Glucose-Capillary: 221 mg/dL — ABNORMAL HIGH (ref 70–99)

## 2018-09-16 SURGERY — POSTERIOR LUMBAR FUSION 1 LEVEL
Anesthesia: General | Site: Spine Lumbar

## 2018-09-16 MED ORDER — LIDOCAINE HCL (CARDIAC) PF 100 MG/5ML IV SOSY
PREFILLED_SYRINGE | INTRAVENOUS | Status: DC | PRN
  Administered 2018-09-16: 30 mg via INTRAVENOUS

## 2018-09-16 MED ORDER — PHENOL 1.4 % MT LIQD: 1.0000 | OROMUCOSAL | Status: DC | PRN

## 2018-09-16 MED ORDER — ACETAMINOPHEN 325 MG PO TABS
650.0000 mg | ORAL_TABLET | ORAL | Status: DC | PRN
  Administered 2018-09-16 – 2018-09-17 (×2): 650 mg via ORAL
  Filled 2018-09-16 (×3): qty 2

## 2018-09-16 MED ORDER — CHLORHEXIDINE GLUCONATE CLOTH 2 % EX PADS: 6.0000 | MEDICATED_PAD | Freq: Once | CUTANEOUS | Status: DC

## 2018-09-16 MED ORDER — PANTOPRAZOLE SODIUM 40 MG PO TBEC
40.0000 mg | DELAYED_RELEASE_TABLET | Freq: Every day | ORAL | Status: DC
  Administered 2018-09-17: 40 mg via ORAL
  Filled 2018-09-16: qty 1

## 2018-09-16 MED ORDER — ONDANSETRON HCL 4 MG/2ML IJ SOLN: 4.0000 mg | Freq: Four times a day (QID) | INTRAMUSCULAR | Status: DC | PRN

## 2018-09-16 MED ORDER — SCOPOLAMINE 1 MG/3DAYS TD PT72
1.0000 | MEDICATED_PATCH | TRANSDERMAL | Status: DC
  Administered 2018-09-16: 1.5 mg via TRANSDERMAL

## 2018-09-16 MED ORDER — INSULIN ASPART 100 UNIT/ML ~~LOC~~ SOLN: 0.0000 [IU] | Freq: Three times a day (TID) | SUBCUTANEOUS | Status: DC

## 2018-09-16 MED ORDER — SUGAMMADEX SODIUM 200 MG/2ML IV SOLN
INTRAVENOUS | Status: DC | PRN
  Administered 2018-09-16: 200 mg via INTRAVENOUS

## 2018-09-16 MED ORDER — ASPIRIN EC 81 MG PO TBEC
81.0000 mg | DELAYED_RELEASE_TABLET | Freq: Every day | ORAL | Status: DC
  Administered 2018-09-17: 81 mg via ORAL
  Filled 2018-09-16: qty 1

## 2018-09-16 MED ORDER — AMLODIPINE BESYLATE 5 MG PO TABS
10.0000 mg | ORAL_TABLET | Freq: Every day | ORAL | Status: DC
  Administered 2018-09-17: 10 mg via ORAL
  Filled 2018-09-16: qty 2

## 2018-09-16 MED ORDER — OXYCODONE HCL 5 MG PO TABS
5.0000 mg | ORAL_TABLET | ORAL | Status: DC | PRN
  Administered 2018-09-16 – 2018-09-17 (×6): 5 mg via ORAL
  Filled 2018-09-16 (×5): qty 1

## 2018-09-16 MED ORDER — METHOCARBAMOL 500 MG PO TABS
ORAL_TABLET | ORAL | Status: AC
  Filled 2018-09-16: qty 1

## 2018-09-16 MED ORDER — SUFENTANIL CITRATE 50 MCG/ML IV SOLN
INTRAVENOUS | Status: AC
  Filled 2018-09-16: qty 1

## 2018-09-16 MED ORDER — PROPOFOL 10 MG/ML IV BOLUS
INTRAVENOUS | Status: DC | PRN
  Administered 2018-09-16: 150 mg via INTRAVENOUS

## 2018-09-16 MED ORDER — MENTHOL 3 MG MT LOZG: 1.0000 | LOZENGE | OROMUCOSAL | Status: DC | PRN

## 2018-09-16 MED ORDER — PREDNISONE 20 MG PO TABS
20.0000 mg | ORAL_TABLET | Freq: Every day | ORAL | Status: DC
  Administered 2018-09-17: 20 mg via ORAL
  Filled 2018-09-16 (×2): qty 1

## 2018-09-16 MED ORDER — 0.9 % SODIUM CHLORIDE (POUR BTL) OPTIME
TOPICAL | Status: DC | PRN
  Administered 2018-09-16: 1000 mL

## 2018-09-16 MED ORDER — MIDAZOLAM HCL 2 MG/2ML IJ SOLN
INTRAMUSCULAR | Status: AC
  Filled 2018-09-16: qty 2

## 2018-09-16 MED ORDER — SODIUM CHLORIDE 0.9% FLUSH: 3.0000 mL | Freq: Two times a day (BID) | INTRAVENOUS | Status: DC

## 2018-09-16 MED ORDER — PHENYLEPHRINE 40 MCG/ML (10ML) SYRINGE FOR IV PUSH (FOR BLOOD PRESSURE SUPPORT)
PREFILLED_SYRINGE | INTRAVENOUS | Status: AC
  Filled 2018-09-16: qty 10

## 2018-09-16 MED ORDER — SODIUM CHLORIDE 0.9 % IV SOLN
INTRAVENOUS | Status: DC | PRN
  Administered 2018-09-16: 15 ug/min via INTRAVENOUS

## 2018-09-16 MED ORDER — ONDANSETRON HCL 4 MG/2ML IJ SOLN
INTRAMUSCULAR | Status: DC | PRN
  Administered 2018-09-16: 4 mg via INTRAVENOUS

## 2018-09-16 MED ORDER — SODIUM CHLORIDE 0.9 % IV SOLN: 250.0000 mL | INTRAVENOUS | Status: DC

## 2018-09-16 MED ORDER — INSULIN ASPART 100 UNIT/ML ~~LOC~~ SOLN
0.0000 [IU] | Freq: Every day | SUBCUTANEOUS | Status: DC
  Administered 2018-09-16: 2 [IU] via SUBCUTANEOUS

## 2018-09-16 MED ORDER — VENLAFAXINE HCL ER 75 MG PO CP24
75.0000 mg | ORAL_CAPSULE | Freq: Every morning | ORAL | Status: DC
  Administered 2018-09-17: 75 mg via ORAL
  Filled 2018-09-16: qty 1

## 2018-09-16 MED ORDER — SCOPOLAMINE 1 MG/3DAYS TD PT72
MEDICATED_PATCH | TRANSDERMAL | Status: AC
  Administered 2018-09-16: 1.5 mg via TRANSDERMAL
  Filled 2018-09-16: qty 1

## 2018-09-16 MED ORDER — VANCOMYCIN HCL IN DEXTROSE 750-5 MG/150ML-% IV SOLN
750.0000 mg | Freq: Once | INTRAVENOUS | Status: AC
  Administered 2018-09-16: 750 mg via INTRAVENOUS
  Filled 2018-09-16: qty 150

## 2018-09-16 MED ORDER — METHOCARBAMOL 500 MG PO TABS
500.0000 mg | ORAL_TABLET | Freq: Four times a day (QID) | ORAL | Status: DC | PRN
  Administered 2018-09-16 – 2018-09-17 (×3): 500 mg via ORAL
  Filled 2018-09-16 (×2): qty 1

## 2018-09-16 MED ORDER — SODIUM CHLORIDE 0.9 % IV SOLN
INTRAVENOUS | Status: DC | PRN
  Administered 2018-09-16: 16:00:00

## 2018-09-16 MED ORDER — ONDANSETRON HCL 4 MG/2ML IJ SOLN
INTRAMUSCULAR | Status: AC
  Filled 2018-09-16: qty 2

## 2018-09-16 MED ORDER — THROMBIN 20000 UNITS EX SOLR
CUTANEOUS | Status: DC | PRN
  Administered 2018-09-16: 16:00:00 via TOPICAL

## 2018-09-16 MED ORDER — LACTATED RINGERS IV SOLN
INTRAVENOUS | Status: DC
  Administered 2018-09-16: 12:00:00 via INTRAVENOUS

## 2018-09-16 MED ORDER — BUPIVACAINE HCL (PF) 0.25 % IJ SOLN
INTRAMUSCULAR | Status: AC
  Filled 2018-09-16: qty 30

## 2018-09-16 MED ORDER — SENNA 8.6 MG PO TABS
1.0000 | ORAL_TABLET | Freq: Two times a day (BID) | ORAL | Status: DC
  Administered 2018-09-16 – 2018-09-17 (×2): 8.6 mg via ORAL
  Filled 2018-09-16 (×2): qty 1

## 2018-09-16 MED ORDER — HEPARIN SODIUM (PORCINE) 1000 UNIT/ML IJ SOLN
INTRAMUSCULAR | Status: AC
  Filled 2018-09-16: qty 1

## 2018-09-16 MED ORDER — ONDANSETRON HCL 4 MG PO TABS: 4.0000 mg | ORAL_TABLET | Freq: Four times a day (QID) | ORAL | Status: DC | PRN

## 2018-09-16 MED ORDER — LIDOCAINE 2% (20 MG/ML) 5 ML SYRINGE
INTRAMUSCULAR | Status: AC
  Filled 2018-09-16: qty 5

## 2018-09-16 MED ORDER — PHENYLEPHRINE HCL 10 MG/ML IJ SOLN
INTRAMUSCULAR | Status: DC | PRN
  Administered 2018-09-16: 100 ug via INTRAVENOUS

## 2018-09-16 MED ORDER — SODIUM CHLORIDE 0.9% FLUSH: 3.0000 mL | INTRAVENOUS | Status: DC | PRN

## 2018-09-16 MED ORDER — THROMBIN 20000 UNITS EX SOLR
CUTANEOUS | Status: AC
  Filled 2018-09-16: qty 20000

## 2018-09-16 MED ORDER — LACTATED RINGERS IV SOLN
INTRAVENOUS | Status: DC | PRN
  Administered 2018-09-16 (×2): via INTRAVENOUS

## 2018-09-16 MED ORDER — DEXAMETHASONE SODIUM PHOSPHATE 10 MG/ML IJ SOLN
INTRAMUSCULAR | Status: AC
  Filled 2018-09-16: qty 1

## 2018-09-16 MED ORDER — PROPOFOL 10 MG/ML IV BOLUS
INTRAVENOUS | Status: AC
  Filled 2018-09-16: qty 40

## 2018-09-16 MED ORDER — THROMBIN 5000 UNITS EX SOLR
OROMUCOSAL | Status: DC | PRN
  Administered 2018-09-16: 16:00:00 via TOPICAL

## 2018-09-16 MED ORDER — FENTANYL CITRATE (PF) 100 MCG/2ML IJ SOLN
25.0000 ug | INTRAMUSCULAR | Status: DC | PRN
  Administered 2018-09-16 (×4): 25 ug via INTRAVENOUS

## 2018-09-16 MED ORDER — ACETAMINOPHEN 650 MG RE SUPP: 650.0000 mg | RECTAL | Status: DC | PRN

## 2018-09-16 MED ORDER — POTASSIUM CHLORIDE IN NACL 20-0.9 MEQ/L-% IV SOLN: INTRAVENOUS | Status: DC

## 2018-09-16 MED ORDER — VANCOMYCIN HCL 1000 MG IV SOLR
INTRAVENOUS | Status: AC
  Filled 2018-09-16: qty 1000

## 2018-09-16 MED ORDER — THROMBIN 5000 UNITS EX SOLR
CUTANEOUS | Status: AC
  Filled 2018-09-16: qty 5000

## 2018-09-16 MED ORDER — BUPIVACAINE HCL (PF) 0.25 % IJ SOLN
INTRAMUSCULAR | Status: DC | PRN
  Administered 2018-09-16: 6 mL

## 2018-09-16 MED ORDER — PROPOFOL 500 MG/50ML IV EMUL
INTRAVENOUS | Status: DC | PRN
  Administered 2018-09-16: 25 ug/kg/min via INTRAVENOUS

## 2018-09-16 MED ORDER — OXYCODONE HCL 5 MG PO TABS
ORAL_TABLET | ORAL | Status: AC
  Filled 2018-09-16: qty 1

## 2018-09-16 MED ORDER — VANCOMYCIN HCL IN DEXTROSE 1-5 GM/200ML-% IV SOLN
INTRAVENOUS | Status: AC
  Administered 2018-09-16: 1000 mg via INTRAVENOUS
  Filled 2018-09-16: qty 200

## 2018-09-16 MED ORDER — INSULIN ASPART 100 UNIT/ML ~~LOC~~ SOLN
0.0000 [IU] | Freq: Three times a day (TID) | SUBCUTANEOUS | Status: DC
  Administered 2018-09-17: 8 [IU] via SUBCUTANEOUS

## 2018-09-16 MED ORDER — HYDROMORPHONE HCL 1 MG/ML IJ SOLN: 0.5000 mg | INTRAMUSCULAR | Status: DC | PRN

## 2018-09-16 MED ORDER — METHOCARBAMOL 1000 MG/10ML IJ SOLN
500.0000 mg | Freq: Four times a day (QID) | INTRAVENOUS | Status: DC | PRN
  Filled 2018-09-16: qty 5

## 2018-09-16 MED ORDER — GABAPENTIN 300 MG PO CAPS
300.0000 mg | ORAL_CAPSULE | Freq: Three times a day (TID) | ORAL | Status: DC
  Administered 2018-09-16 – 2018-09-17 (×2): 300 mg via ORAL
  Filled 2018-09-16 (×2): qty 1

## 2018-09-16 MED ORDER — SUFENTANIL CITRATE 50 MCG/ML IV SOLN
INTRAVENOUS | Status: DC | PRN
  Administered 2018-09-16: 15 ug via INTRAVENOUS
  Administered 2018-09-16 (×2): 5 ug via INTRAVENOUS

## 2018-09-16 MED ORDER — ROCURONIUM BROMIDE 50 MG/5ML IV SOSY
PREFILLED_SYRINGE | INTRAVENOUS | Status: DC | PRN
  Administered 2018-09-16: 50 mg via INTRAVENOUS
  Administered 2018-09-16: 10 mg via INTRAVENOUS

## 2018-09-16 MED ORDER — ROCURONIUM BROMIDE 50 MG/5ML IV SOSY
PREFILLED_SYRINGE | INTRAVENOUS | Status: AC
  Filled 2018-09-16: qty 10

## 2018-09-16 MED ORDER — ONDANSETRON HCL 4 MG/2ML IJ SOLN: 4.0000 mg | Freq: Once | INTRAMUSCULAR | Status: DC | PRN

## 2018-09-16 MED ORDER — MIDAZOLAM HCL 5 MG/5ML IJ SOLN
INTRAMUSCULAR | Status: DC | PRN
  Administered 2018-09-16: 2 mg via INTRAVENOUS

## 2018-09-16 MED ORDER — VANCOMYCIN HCL IN DEXTROSE 1-5 GM/200ML-% IV SOLN
1000.0000 mg | INTRAVENOUS | Status: AC
  Administered 2018-09-16: 1000 mg via INTRAVENOUS

## 2018-09-16 MED ORDER — DEXAMETHASONE SODIUM PHOSPHATE 10 MG/ML IJ SOLN
10.0000 mg | INTRAMUSCULAR | Status: AC
  Administered 2018-09-16: 10 mg via INTRAVENOUS

## 2018-09-16 MED ORDER — FENTANYL CITRATE (PF) 100 MCG/2ML IJ SOLN
INTRAMUSCULAR | Status: AC
  Filled 2018-09-16: qty 2

## 2018-09-16 SURGICAL SUPPLY — 65 items
ADH SKN CLS APL DERMABOND .7 (GAUZE/BANDAGES/DRESSINGS) ×1
APL SKNCLS STERI-STRIP NONHPOA (GAUZE/BANDAGES/DRESSINGS) ×1
BAG DECANTER FOR FLEXI CONT (MISCELLANEOUS) ×2 IMPLANT
BASKET BONE COLLECTION (BASKET) ×2 IMPLANT
BENZOIN TINCTURE PRP APPL 2/3 (GAUZE/BANDAGES/DRESSINGS) ×2 IMPLANT
BUR MATCHSTICK NEURO 3.0 LAGG (BURR) ×2 IMPLANT
CANISTER SUCT 3000ML PPV (MISCELLANEOUS) ×2 IMPLANT
CARTRIDGE OIL MAESTRO DRILL (MISCELLANEOUS) ×1 IMPLANT
CONT SPEC 4OZ CLIKSEAL STRL BL (MISCELLANEOUS) ×2 IMPLANT
COVER BACK TABLE 60X90IN (DRAPES) ×2 IMPLANT
COVER WAND RF STERILE (DRAPES) ×2 IMPLANT
DERMABOND ADVANCED (GAUZE/BANDAGES/DRESSINGS) ×1
DERMABOND ADVANCED .7 DNX12 (GAUZE/BANDAGES/DRESSINGS) ×1 IMPLANT
DIFFUSER DRILL AIR PNEUMATIC (MISCELLANEOUS) ×2 IMPLANT
DRAPE C-ARM 42X72 X-RAY (DRAPES) ×4 IMPLANT
DRAPE LAPAROTOMY 100X72X124 (DRAPES) ×2 IMPLANT
DRAPE POUCH INSTRU U-SHP 10X18 (DRAPES) ×2 IMPLANT
DRSG OPSITE POSTOP 4X6 (GAUZE/BANDAGES/DRESSINGS) ×1 IMPLANT
DURAPREP 26ML APPLICATOR (WOUND CARE) ×2 IMPLANT
ELECT REM PT RETURN 9FT ADLT (ELECTROSURGICAL) ×2
ELECTRODE REM PT RTRN 9FT ADLT (ELECTROSURGICAL) ×1 IMPLANT
EVACUATOR 1/8 PVC DRAIN (DRAIN) ×1 IMPLANT
GAUZE 4X4 16PLY RFD (DISPOSABLE) IMPLANT
GLOVE BIO SURGEON STRL SZ7 (GLOVE) ×2 IMPLANT
GLOVE BIO SURGEON STRL SZ8 (GLOVE) ×4 IMPLANT
GLOVE BIOGEL PI IND STRL 7.0 (GLOVE) IMPLANT
GLOVE BIOGEL PI IND STRL 7.5 (GLOVE) IMPLANT
GLOVE BIOGEL PI INDICATOR 7.0 (GLOVE) ×5
GLOVE BIOGEL PI INDICATOR 7.5 (GLOVE) ×2
GLOVE SS N UNI LF 6.5 STRL (GLOVE) ×2 IMPLANT
GLOVE SS N UNI LF 7.0 STRL (GLOVE) ×3 IMPLANT
GOWN STRL REUS W/ TWL LRG LVL3 (GOWN DISPOSABLE) IMPLANT
GOWN STRL REUS W/ TWL XL LVL3 (GOWN DISPOSABLE) ×2 IMPLANT
GOWN STRL REUS W/TWL 2XL LVL3 (GOWN DISPOSABLE) IMPLANT
GOWN STRL REUS W/TWL LRG LVL3 (GOWN DISPOSABLE) ×8
GOWN STRL REUS W/TWL XL LVL3 (GOWN DISPOSABLE) ×4
HEMOSTAT POWDER KIT SURGIFOAM (HEMOSTASIS) ×1 IMPLANT
KIT BASIN OR (CUSTOM PROCEDURE TRAY) ×2 IMPLANT
KIT BONE MRW ASP ANGEL CPRP (KITS) ×1 IMPLANT
KIT TURNOVER KIT B (KITS) ×2 IMPLANT
MATRIX STRIP NEOCORE 12C (Putty) IMPLANT
MILL MEDIUM DISP (BLADE) ×2 IMPLANT
NDL HYPO 25X1 1.5 SAFETY (NEEDLE) ×1 IMPLANT
NEEDLE HYPO 25X1 1.5 SAFETY (NEEDLE) ×2 IMPLANT
NS IRRIG 1000ML POUR BTL (IV SOLUTION) ×2 IMPLANT
OIL CARTRIDGE MAESTRO DRILL (MISCELLANEOUS) ×2
PACK LAMINECTOMY NEURO (CUSTOM PROCEDURE TRAY) ×2 IMPLANT
PAD ARMBOARD 7.5X6 YLW CONV (MISCELLANEOUS) ×6 IMPLANT
ROD LORD TI 5.5X35 (Rod) ×2 IMPLANT
SCREW ILIAC PA 5.5X40 (Screw) ×4 IMPLANT
SET SCREW (Screw) ×8 IMPLANT
SET SCREW SPNE (Screw) IMPLANT
SPACER IDENTITI PS 10X9X25 10D (Spacer) ×2 IMPLANT
SPONGE LAP 4X18 RFD (DISPOSABLE) IMPLANT
SPONGE SURGIFOAM ABS GEL 100 (HEMOSTASIS) ×2 IMPLANT
STRIP CLOSURE SKIN 1/2X4 (GAUZE/BANDAGES/DRESSINGS) ×3 IMPLANT
STRIP MATRIX NEOCORE 12CC (Putty) ×1 IMPLANT
SUT VIC AB 0 CT1 18XCR BRD8 (SUTURE) ×1 IMPLANT
SUT VIC AB 0 CT1 8-18 (SUTURE) ×2
SUT VIC AB 2-0 CP2 18 (SUTURE) ×2 IMPLANT
SUT VIC AB 3-0 SH 8-18 (SUTURE) ×3 IMPLANT
TOWEL GREEN STERILE (TOWEL DISPOSABLE) ×2 IMPLANT
TOWEL GREEN STERILE FF (TOWEL DISPOSABLE) ×2 IMPLANT
TRAY FOLEY MTR SLVR 16FR STAT (SET/KITS/TRAYS/PACK) ×2 IMPLANT
WATER STERILE IRR 1000ML POUR (IV SOLUTION) ×2 IMPLANT

## 2018-09-16 NOTE — Transfer of Care (Signed)
Immediate Anesthesia Transfer of Care Note  Patient: Tina Briggs  Procedure(s) Performed: Lumbar Four-Five Posterior Lateral and Interbody fusion (N/A Spine Lumbar)  Patient Location: PACU  Anesthesia Type:General  Level of Consciousness: awake, alert  and oriented  Airway & Oxygen Therapy: Patient Spontanous Breathing and Patient connected to nasal cannula oxygen  Post-op Assessment: Report given to RN and Post -op Vital signs reviewed and stable  Post vital signs: Reviewed and stable  Last Vitals:  Vitals Value Taken Time  BP 147/73 09/16/2018  4:32 PM  Temp    Pulse 93 09/16/2018  4:34 PM  Resp 12 09/16/2018  4:34 PM  SpO2 97 % 09/16/2018  4:34 PM  Vitals shown include unvalidated device data.  Last Pain:  Vitals:   09/16/18 1147  TempSrc:   PainSc: 0-No pain         Complications: No apparent anesthesia complications

## 2018-09-16 NOTE — Progress Notes (Signed)
Orthopedic Tech Progress Note Patient Details:  Tina Briggs 1946/09/17 [id number]  Patient ID: [name], female   DOB: 15-Jan-1946, 72 y.o.   MRN: [id number] Pt has brace  [name] 09/16/2018, 4:44 PM

## 2018-09-16 NOTE — H&P (Signed)
Subjective: Patient is a 72 y.o. female admitted for back and leg pain. Onset of symptoms was several months ago, gradually worsening since that time.  The pain is rated severe, and is located at the across the lower back and radiates to leg. The pain is described as aching and occurs all day. The symptoms have been progressive. Symptoms are exacerbated by exercise. MRI or CT showed recurrent disk L4-5   Past Medical History:  Diagnosis Date  . Anemia   . Aspiration pneumonia ([place])    after common bile duct obstruction  . CATARACT, RIGHT EYE    had repaired  . Depression   . DM   . GERD   . Glomerulonephritis 2019  . HNP (herniated nucleus pulposus), lumbar    Recurrent  . HYPERLIPIDEMIA-MIXED   . HYPERTENSION, UNSPECIFIED   . OBESITY   . PONV (postoperative nausea and vomiting)    past history only, "sometimes they put a patch on me"    Past Surgical History:  Procedure Laterality Date  . ABDOMINAL HYSTERECTOMY    . APPENDECTOMY    . BACK SURGERY     lumbar '09"microdiscectomy"  . CHOLECYSTECTOMY     open  . COLONOSCOPY WITH PROPOFOL N/A 11/22/2014   Procedure: COLONOSCOPY WITH PROPOFOL;  Surgeon: Garlan Fair, MD;  Location: [facility];  Service: Endoscopy;  Laterality: N/A;  . ERCP N/A 05/26/2015   Procedure: ENDOSCOPIC RETROGRADE CHOLANGIOPANCREATOGRAPHY (ERCP)   (DOING CASE IN MAIN OR);  Surgeon: Teena Irani, MD;  Location: [facility];  Service: Gastroenterology;  Laterality: N/A;  . ERCP N/A 06/06/2015   Procedure: ENDOSCOPIC RETROGRADE CHOLANGIOPANCREATOGRAPHY (ERCP);  Surgeon: Clarene Essex, MD;  Location: [facility];  Service: Endoscopy;  Laterality: N/A;  . ESOPHAGOGASTRODUODENOSCOPY (EGD) WITH PROPOFOL N/A 11/22/2014   Procedure: ESOPHAGOGASTRODUODENOSCOPY (EGD) WITH PROPOFOL;  Surgeon: Garlan Fair, MD;  Location: [facility];  Service: Endoscopy;  Laterality: N/A;  . EYE SURGERY Bilateral    cataracts  . LUMBAR LAMINECTOMY/DECOMPRESSION MICRODISCECTOMY  Left 12/31/2017   Procedure: Laminectomy and Foraminotomy - Lumbar Three-Four - left Extraforaminal diskectomy Lumbar Four-Five left;  Surgeon: Eustace Moore, MD;  Location: [place];  Service: Neurosurgery;  Laterality: Left;  . PERONEAL NERVE DECOMPRESSION Left 12/31/2017   Procedure: Left PERONEAL NERVE DECOMPRESSION;  Surgeon: Eustace Moore, MD;  Location: [place];  Service: Neurosurgery;  Laterality: Left;  left  . TUBAL LIGATION      Prior to Admission medications   Medication Sig Start Date End Date Taking? Authorizing Provider  acetaminophen (TYLENOL) 500 MG tablet Take 1,000 mg by mouth 2 (two) times daily as needed for moderate pain or headache.    Yes [provider]  amLODipine (NORVASC) 10 MG tablet Take 10 mg by mouth daily.   Yes [provider]  aspirin EC 81 MG tablet Take 81 mg by mouth daily.   Yes [provider]  cholecalciferol (VITAMIN D) 1000 units tablet Take 1,000 Units by mouth daily.   Yes [provider]  CRESTOR 40 MG tablet TAKE 1 TABLET AT BEDTIME Patient taking differently: Take 40 mg by mouth at bedtime.  04/19/13  Yes Wall, Marijo Conception, MD  fluticasone (FLONASE) 50 MCG/ACT nasal spray Place 2 sprays into both nostrils daily as needed for allergies.    Yes [provider]  gabapentin (NEURONTIN) 300 MG capsule Take 300 mg by mouth 3 (three) times daily. 08/20/18  Yes [provider]  HYDROcodone-acetaminophen (NORCO) 7.5-325 MG tablet Take 1 tablet by mouth  every 4 (four) hours as needed for moderate pain.   Yes [provider]  insulin aspart (NOVOLOG) 100 UNIT/ML injection Inject 18-34 Units into the skin 3 (three) times daily before meals.    Yes [provider]  methocarbamol (ROBAXIN) 500 MG tablet Take 1 tablet (500 mg total) by mouth 2 (two) times daily. Patient taking differently: Take 750 mg by mouth 3 (three) times daily as needed for muscle spasms.  08/28/18  Yes Caccavale, Sophia, PA-C   pantoprazole (PROTONIX) 40 MG tablet Take 40 mg by mouth daily.   Yes [provider]  predniSONE (DELTASONE) 10 MG tablet Take 20 mg by mouth daily.    Yes [provider]  venlafaxine XR (EFFEXOR-XR) 75 MG 24 hr capsule Take 75 mg by mouth every morning.    Yes [provider]  vitamin B-12 (CYANOCOBALAMIN) 1000 MCG tablet Take 1,000 mcg by mouth 3 (three) times a week. Take on Sundays, Wednesdays and Fridays.   Yes [provider]  lidocaine (LIDODERM) 5 % Place 1 patch onto the skin daily. Remove & Discard patch within 12 hours or as directed by MD Patient not taking: Reported on 09/07/2018 08/28/18   Caccavale, Sophia, PA-C  oxyCODONE (OXY IR/ROXICODONE) 5 MG immediate release tablet Take 1 tablet (5 mg total) by mouth every 6 (six) hours as needed for moderate pain ((score 4 to 6)). Patient not taking: Reported on 09/07/2018 01/01/18   Eustace Moore, MD   Allergies  Allergen Reactions  . Codeine Shortness Of Breath  . Tetracyclines & Related Itching  . Azithromycin Itching and Rash  . Erythromycin Itching and Rash  . Hydrocodone     Doesn't like to take  . Morphine And Related Itching and Rash  . Penicillins Itching and Rash    Has patient had a PCN reaction causing immediate rash, facial/tongue/throat swelling, SOB or lightheadedness with hypotension: Yes Has patient had a PCN reaction causing severe rash involving mucus membranes or skin necrosis: No Has patient had a PCN reaction that required hospitalization: No Has patient had a PCN reaction occurring within the last 10 years: No If all of the above answers are "NO", then may proceed with Cephalosporin use.   [name] [Sulfamethoxazole-Trimethoprim] Itching and Rash    Social History   Tobacco Use  . Smoking status: Never Smoker  . Smokeless tobacco: Never Used  Substance Use Topics  . Alcohol use: No    Family History  Problem Relation Age of Onset  . Cancer Maternal Aunt         breast cancer  . Cancer Maternal Grandmother        kidney cancer   . Cancer Paternal Grandmother        bone cancer      Review of Systems  Positive ROS: neg  All other systems have been reviewed and were otherwise negative with the exception of those mentioned in the HPI and as above.  Objective: Vital signs in last 24 hours: Temp:  [98 F (36.7 C)] 98 F (36.7 C) (12/18 1131) Pulse Rate:  [78] 78 (12/18 1131) Resp:  [18] 18 (12/18 1131) BP: (149)/(78) 149/78 (12/18 1131) SpO2:  [98 %] 98 % (12/18 1131) Weight:  [78 kg] 78 kg (12/18 1131)  General Appearance: Alert, cooperative, no distress, appears stated age Head: Normocephalic, without obvious abnormality, atraumatic Eyes: PERRL, conjunctiva/corneas clear, EOM's intact    Neck: Supple, symmetrical, trachea midline Back: Symmetric, no curvature, ROM normal, no CVA  tenderness Lungs:  respirations unlabored Heart: Regular rate and rhythm Abdomen: Soft, non-tender Extremities: Extremities normal, atraumatic, no cyanosis or edema Pulses: 2+ and symmetric all extremities Skin: Skin color, texture, turgor normal, no rashes or lesions  NEUROLOGIC:   Mental status: Alert and oriented x4,  no aphasia, good attention span, fund of knowledge, and memory Motor Exam - grossly normal Sensory Exam - grossly normal Reflexes: 1+ Coordination - grossly normal Gait - grossly normal Balance - grossly normal Cranial Nerves: I: smell Not tested  II: visual acuity  OS: nl    OD: nl  II: visual fields Full to confrontation  II: pupils Equal, round, reactive to light  III,VII: ptosis None  III,IV,VI: extraocular muscles  Full ROM  V: mastication Normal  V: facial light touch sensation  Normal  V,VII: corneal reflex  Present  VII: facial muscle function - upper  Normal  VII: facial muscle function - lower Normal  VIII: hearing Not tested  IX: soft palate elevation  Normal  IX,X: gag reflex Present  XI: trapezius strength  5/5   XI: sternocleidomastoid strength 5/5  XI: neck flexion strength  5/5  XII: tongue strength  Normal    Data Review Lab Results  Component Value Date   WBC 8.7 09/09/2018   HGB 14.6 09/09/2018   HCT 44.0 09/09/2018   MCV 87.3 09/09/2018   PLT 191 09/09/2018   Lab Results  Component Value Date   NA 138 09/09/2018   K 3.9 09/09/2018   CL 102 09/09/2018   CO2 24 09/09/2018   BUN 32 (H) 09/09/2018   CREATININE 1.45 (H) 09/09/2018   GLUCOSE 255 (H) 09/09/2018   Lab Results  Component Value Date   INR 0.97 09/09/2018    Assessment/Plan:  Estimated body mass index is 30.47 kg/m as calculated from the following:   Height as of this encounter: 5\' 3"  (1.6 m).   Weight as of this encounter: 78 kg. Patient admitted for PLIF L4-5. Patient has failed a reasonable attempt at conservative therapy.  I explained the condition and procedure to the patient and answered any questions.  Patient wishes to proceed with procedure as planned. Understands risks/ benefits and typical outcomes of procedure.   Doylene Splinter S 09/16/2018 12:58 PM

## 2018-09-16 NOTE — Progress Notes (Signed)
Pharmacy Antibiotic Note  Tina Briggs is a 72 y.o. female admitted on 09/16/2018 s/p Decompressive lumbar laminectomy L4-5 .  Pharmacy has been consulted for vancomycin for surgical prophylaxis dosing. No drains noted.   Plan: Vancomycin 750mg  IV x 1 Pharmacy will sign off  Height: 5\' 3"  (160 cm) Weight: 172 lb (78 kg) IBW/kg (Calculated) : 52.4  Temp (24hrs), Avg:97.7 F (36.5 C), Min:97.5 F (36.4 C), Max:98 F (36.7 C)  No results for input(s): WBC, CREATININE, LATICACIDVEN, VANCOTROUGH, VANCOPEAK, VANCORANDOM, GENTTROUGH, GENTPEAK, GENTRANDOM, TOBRATROUGH, TOBRAPEAK, TOBRARND, AMIKACINPEAK, AMIKACINTROU, AMIKACIN in the last 168 hours.  Estimated Creatinine Clearance: 35.2 mL/min (A) (by C-G formula based on SCr of 1.45 mg/dL (H)).    Allergies  Allergen Reactions  . Codeine Shortness Of Breath  . Tetracyclines & Related Itching  . Azithromycin Itching and Rash  . Erythromycin Itching and Rash  . Hydrocodone     Doesn't like to take  . Morphine And Related Itching and Rash  . Penicillins Itching and Rash    Has patient had a PCN reaction causing immediate rash, facial/tongue/throat swelling, SOB or lightheadedness with hypotension: Yes Has patient had a PCN reaction causing severe rash involving mucus membranes or skin necrosis: No Has patient had a PCN reaction that required hospitalization: No Has patient had a PCN reaction occurring within the last 10 years: No If all of the above answers are "NO", then may proceed with Cephalosporin use.   Tina Briggs [Sulfamethoxazole-Trimethoprim] Itching and Rash    Tina Briggs Dy, PharmD, [place] Pager: [phone] Please utilize [facility] for appropriate phone number to reach the unit pharmacist ([place])   09/16/2018 6:23 PM

## 2018-09-16 NOTE — Op Note (Signed)
09/16/2018  4:25 PM  PATIENT:  Tina Briggs  72 y.o. female  PRE-OPERATIVE DIAGNOSIS: Recurrent disc herniation L4-5 left with back and left leg pain  POST-OPERATIVE DIAGNOSIS:  same  PROCEDURE:   1. Decompressive lumbar laminectomy L4-5 requiring more work than would be required for a simple exposure of the disk for PLIF in order to adequately decompress the neural elements and address the spinal stenosis 2. Posterior lumbar interbody fusion L4-5 using porous titanium interbody cages packed with morcellized allograft and autograft soaked with a bone marrow aspirate obtained through a separate fascial incision over the right iliac crest 3. Posterior fixation L4-5 using Alphatec cortical pedicle screws.  4. Intertransverse arthrodesis L4-5 using morcellized autograft and allograft.  SURGEON:  Sherley Bounds, MD  ASSISTANTS: Glenford Peers FNP  ANESTHESIA:  General  EBL: 275 ml  Total I/O In: 1300 [I.V.:1300] Out: 700 [Urine:425; Blood:275]  BLOOD ADMINISTERED:none  DRAINS: none   INDICATION FOR PROCEDURE: This patient presented with severe back and left leg pain. Imaging revealed large recurrent extraforaminal disc herniation L4-5 left. The patient tried a reasonable attempt at conservative medical measures without relief. I recommended decompression and instrumented fusion to address the stenosis as well as the segmental  instability.  Patient understood the risks, benefits, and alternatives and potential outcomes and wished to proceed.  PROCEDURE DETAILS:  The patient was brought to the operating room. After induction of generalized endotracheal anesthesia the patient was rolled into the prone position on chest rolls and all pressure points were padded. The patient's lumbar region was cleaned and then prepped with DuraPrep and draped in the usual sterile fashion. Anesthesia was injected and then a dorsal midline incision was made and carried down to the lumbosacral fascia.  The fascia was opened and the paraspinous musculature was taken down in a subperiosteal fashion to expose L4-5. A self-retaining retractor was placed. Intraoperative fluoroscopy confirmed my level, and I started with placement of the L4 cortical pedicle screws. The pedicle screw entry zones were identified utilizing surface landmarks and  AP and lateral fluoroscopy. I scored the cortex with the high-speed drill and then used the hand drill to drill an upward and outward direction into the pedicle. I then tapped line to line. I then placed a 5.5 x 40 mm cortical pedicle screw into the pedicles of L4 bilaterally.  I then dissected in a suprafascial plane to expose the iliac crest.  Open the fascia used a Jamshidi needle to extract 60 cc of bone marrow aspirate from the iliac crest.  This was then spun down by [facility] device and 2 to 4 cc of  BMAC was soaked on morselized allograft for later arthrodesis.  I dried the hole with Surgifoam and closed the fascia.  I then turned my attention to the decompression and complete lumbar laminectomies, hemi- facetectomies, and foraminotomies were performed at L4-5. The patient had significant spinal stenosis and this required more work than would be required for a simple exposure of the disc for posterior lumbar interbody fusion which would only require a limited laminotomy. Much more generous decompression and generous foraminotomy was undertaken in order to adequately decompress the neural elements and address the patient's leg pain. The yellow ligament was removed to expose the underlying dura and nerve roots, and generous foraminotomies were performed to adequately decompress the neural elements. Both the exiting and traversing nerve roots were decompressed on both sides until a coronary dilator passed easily along the nerve roots.  There is a very large  recurrent disc herniation at L4-5 on the left compressing with the L4 and the L5 nerve roots.  This was removed with a nerve  hook and pituitary rongeurs.  Once the decompression was complete, I turned my attention to the posterior lower lumbar interbody fusion. The epidural venous vasculature was coagulated and cut sharply. Disc space was incised and the initial discectomy was performed with pituitary rongeurs. The disc space was distracted with sequential distractors to a height of 10 mm. We then used a series of scrapers and shavers to prepare the endplates for fusion. The midline was prepared with Epstein curettes. Once the complete discectomy was finished, we packed an appropriate sized interbody cage with local autograft and morcellized allograft, gently retracted the nerve root, and tapped the cage into position at L4-5.  The midline between the cages was packed with morselized autograft and allograft. We then turned our attention to the placement of the lower pedicle screws. The pedicle screw entry zones were identified utilizing surface landmarks and fluoroscopy. I drilled into each pedicle utilizing the hand drill, and tapped each pedicle with the appropriate tap. We palpated with a ball probe to assure no break in the cortex. We then placed 5.5 x 40 mm cortical pedicle screws into the pedicles bilaterally at L5. We then decorticated the transverse processes and laid a mixture of morcellized autograft and allograft out over these to perform intertransverse arthrodesis at L4-5. We then placed lordotic rods into the multiaxial screw heads of the pedicle screws and locked these in position with the locking caps and anti-torque device. We then checked our construct with AP and lateral fluoroscopy. Irrigated with copious amounts of bacitracin-containing saline solution. Inspected the nerve roots once again to assure adequate decompression, lined to the dura with Gelfoam, placed powdered vancomycin into the wound, and closed the muscle and the fascia with 0 Vicryl. Closed the subcutaneous tissues with 2-0 Vicryl and subcuticular  tissues with 3-0 Vicryl. The skin was closed with benzoin and Steri-Strips. Dressing was then applied, the patient was awakened from general anesthesia and transported to the recovery room in stable condition. At the end of the procedure all sponge, needle and instrument counts were correct.   PLAN OF CARE: admit to inpatient  PATIENT DISPOSITION:  PACU - hemodynamically stable.   Delay start of Pharmacological VTE agent (>24hrs) due to surgical blood loss or risk of bleeding:  yes

## 2018-09-16 NOTE — Anesthesia Preprocedure Evaluation (Addendum)
Anesthesia Evaluation  Patient identified by MRN, date of birth, ID band Patient awake    Reviewed: Allergy & Precautions, NPO status , Patient's Chart, lab work & pertinent test results  History of Anesthesia Complications (+) PONV and history of anesthetic complications  Airway Mallampati: II  TM Distance: >3 FB Neck ROM: Full    Dental  (+) Dental Advisory Given, Teeth Intact   Pulmonary neg pulmonary ROS,    breath sounds clear to auscultation       Cardiovascular hypertension, Pt. on medications (-) angina Rhythm:Regular Rate:Normal     Neuro/Psych PSYCHIATRIC DISORDERS Depression negative neurological ROS     GI/Hepatic Neg liver ROS, GERD  Medicated and Controlled,  Endo/Other  diabetes, Type 2, Insulin Dependent Obesity   Renal/GU Renal InsufficiencyRenal disease     Musculoskeletal negative musculoskeletal ROS (+)   Abdominal   Peds  Hematology negative hematology ROS (+)   Anesthesia Other Findings   Reproductive/Obstetrics                           Anesthesia Physical Anesthesia Plan  ASA: III  Anesthesia Plan: General   Post-op Pain Management:    Induction: Intravenous  PONV Risk Score and Plan: 4 or greater and Treatment may vary due to age or medical condition, Ondansetron, Scopolamine patch - Pre-op, Dexamethasone and Propofol infusion  Airway Management Planned: Oral ETT  Additional Equipment: None  Intra-op Plan:   Post-operative Plan: Extubation in OR  Informed Consent: I have reviewed the patients History and Physical, chart, labs and discussed the procedure including the risks, benefits and alternatives for the proposed anesthesia with the patient or authorized representative who has indicated his/her understanding and acceptance.   Dental advisory given  Plan Discussed with: CRNA and Anesthesiologist  Anesthesia Plan Comments:         Anesthesia Quick Evaluation

## 2018-09-16 NOTE — Anesthesia Procedure Notes (Signed)
Procedure Name: Intubation Date/Time: 09/16/2018 1:55 PM Performed by: Eligha Bridegroom, CRNA Pre-anesthesia Checklist: Patient identified, Emergency Drugs available, Suction available and Patient being monitored Patient Re-evaluated:Patient Re-evaluated prior to induction Oxygen Delivery Method: Circle system utilized Preoxygenation: Pre-oxygenation with 100% oxygen Induction Type: IV induction Ventilation: Mask ventilation without difficulty and Oral airway inserted - appropriate to patient size Laryngoscope Size: Mac and 3 Grade View: Grade II Tube type: Oral Number of attempts: 1 Airway Equipment and Method: Stylet Placement Confirmation: ETT inserted through vocal cords under direct vision and positive ETCO2 Secured at: 21 cm Tube secured with: Tape Dental Injury: Teeth and Oropharynx as per pre-operative assessment

## 2018-09-17 LAB — GLUCOSE, CAPILLARY: Glucose-Capillary: 280 mg/dL — ABNORMAL HIGH (ref 70–99)

## 2018-09-17 MED ORDER — HYDROCODONE-ACETAMINOPHEN 5-325 MG PO TABS: 1.0000 | ORAL_TABLET | ORAL | 0 refills | Status: DC | PRN

## 2018-09-17 MED ORDER — METHOCARBAMOL 500 MG PO TABS: 500.0000 mg | ORAL_TABLET | Freq: Four times a day (QID) | ORAL | 0 refills | Status: DC | PRN

## 2018-09-17 NOTE — Discharge Instructions (Signed)

## 2018-09-17 NOTE — Evaluation (Signed)
Physical Therapy Evaluation and Discharge Patient Details Name: Tina Briggs MRN: [id number] DOB: [birth year] Today's Date: 09/17/2018   History of Present Illness  This 72 y.o. female admitted for PLIF.  PMH includes:  CKD, DM, h/o lumbar laminectomy   Clinical Impression  Patient evaluated by Physical Therapy with no further acute PT needs identified. All education has been completed and the patient has no further questions. At the time of PT eval pt was able to perform transfers and ambulation with gross supervision for safety to modified independence with [facility] for support. Husband present for education at the end of session. Pt was educated on precautions, safe use of DME, car transfer, and safe activity progression. See below for any follow-up Physical Therapy or equipment needs. PT is signing off. Thank you for this referral.     Follow Up Recommendations No PT follow up;Supervision for mobility/OOB    Equipment Recommendations  None recommended by PT    Recommendations for Other Services       Precautions / Restrictions Precautions Precautions: Back Precaution Booklet Issued: Yes (comment) Precaution Comments: handout provided and reviewed back precautions with pt  Required Braces or Orthoses: Spinal Brace Spinal Brace: Lumbar corset;Applied in sitting position Restrictions Weight Bearing Restrictions: No      Mobility  Bed Mobility               General bed mobility comments: Pt received sitting up on EOB when PT arrived.   Transfers Overall transfer level: Needs assistance Equipment used: Straight cane Transfers: Sit to/from Stand Sit to Stand: Supervision         General transfer comment: Pt was able to power up to full stand without assistance.   Ambulation/Gait Ambulation/Gait assistance: Supervision Gait Distance (Feet): 400 Feet Assistive device: Straight cane Gait Pattern/deviations: Step-through pattern;Decreased stride length;Trunk  flexed Gait velocity: Decreased Gait velocity interpretation: 1.31 - 2.62 ft/sec, indicative of limited community ambulator General Gait Details: With SPC, pt safe with light supervision for safety. She demonstrated good posture.  Stairs         General stair comments: Pt was verbally instructed on safe stair negotiation. Does not have stairs at home but she was asking for if she had to do them in the community.   Wheelchair Mobility    Modified Rankin (Stroke Patients Only)       Balance Overall balance assessment: Mild deficits observed, not formally tested                                           Pertinent Vitals/Pain Pain Assessment: Faces Faces Pain Scale: Hurts a little bit Pain Location: back  Pain Descriptors / Indicators: Operative site guarding Pain Intervention(s): Monitored during session    Home Living Family/patient expects to be discharged to:: Private residence Living Arrangements: Spouse/significant other Available Help at Discharge: Family;Available 24 hours/day Type of Home: House Home Access: Level entry     Home Layout: One level Home Equipment: Walker - 2 wheels;Wheelchair - manual      Prior Function Level of Independence: Independent         Comments: recent fall      Hand Dominance   Dominant Hand: Right    Extremity/Trunk Assessment   Upper Extremity Assessment Upper Extremity Assessment: Defer to OT evaluation    Lower Extremity Assessment Lower Extremity Assessment: Generalized weakness    Cervical /  Trunk Assessment Cervical / Trunk Assessment: Other exceptions Cervical / Trunk Exceptions: s/p surgery  Communication   Communication: No difficulties  Cognition Arousal/Alertness: Awake/alert Behavior During Therapy: WFL for tasks assessed/performed Overall Cognitive Status: Within Functional Limits for tasks assessed                                        General Comments       Exercises     Assessment/Plan    PT Assessment Patent does not need any further PT services  PT Problem List         PT Treatment Interventions      PT Goals (Current goals can be found in the Care Plan section)  Acute Rehab PT Goals Patient Stated Goal: To get back to being able to do everything  PT Goal Formulation: All assessment and education complete, DC therapy    Frequency     Barriers to discharge        Co-evaluation               AM-PAC PT "6 Clicks" Mobility  Outcome Measure Help needed turning from your back to your side while in a flat bed without using bedrails?: None Help needed moving from lying on your back to sitting on the side of a flat bed without using bedrails?: None Help needed moving to and from a bed to a chair (including a wheelchair)?: None Help needed standing up from a chair using your arms (e.g., wheelchair or bedside chair)?: None Help needed to walk in hospital room?: None Help needed climbing 3-5 steps with a railing? : A Little 6 Click Score: 23    End of Session   Activity Tolerance: Patient tolerated treatment well Patient left: in chair;with call bell/phone within reach;with family/visitor present Nurse Communication: Mobility status PT Visit Diagnosis: Unsteadiness on feet (R26.81);Pain    Time: 0922-0942 PT Time Calculation (min) (ACUTE ONLY): 20 min   Charges:   PT Evaluation $PT Eval Low Complexity: 1 Low          Rolinda Roan, PT, DPT Acute Rehabilitation Services Pager: [phone] Office: [phone]   Thelma Comp 09/17/2018, 12:30 PM

## 2018-09-17 NOTE — Progress Notes (Signed)
Pt and husband given D/C instructions with Rx's, verbal understanding was provided. Pt's incision is clean and dry with no sign of infection. Pt's IV was removed prior to D/C. Pt D/C'd home via wheelchair @ 1145 per MD order. Pt is stable @ D/C and has no other needs at this time. Holli Humbles, RN

## 2018-09-17 NOTE — Evaluation (Signed)
Occupational Therapy Evaluation Patient Details Name: Tina Briggs MRN: [id number] DOB: June 09, 1946 Today's Date: 09/17/2018    History of Present Illness This 72 y.o. female admitted for PLIF.  PMH includes:  CKD, DM, h/o lumbar laminectomy    Clinical Impression   Patient evaluated by Occupational Therapy with no further acute OT needs identified. All education has been completed and the patient has no further questions. All instruction provided.  She is able to perform ADLs with supervision.  Spouse is very supportive.  See below for any follow-up Occupational Therapy or equipment needs. OT is signing off. Thank you for this referral.      Follow Up Recommendations  No OT follow up;Supervision/Assistance - 24 hour    Equipment Recommendations  None recommended by OT    Recommendations for Other Services       Precautions / Restrictions Precautions Precautions: Back Precaution Booklet Issued: Yes (comment) Precaution Comments: handout provided and reviewed back precautions with pt  Required Braces or Orthoses: Spinal Brace Spinal Brace: Lumbar corset;Applied in sitting position Restrictions Weight Bearing Restrictions: No      Mobility Bed Mobility Overal bed mobility: Needs Assistance Bed Mobility: Rolling;Sidelying to Sit;Sit to Sidelying Rolling: Supervision Sidelying to sit: Supervision     Sit to sidelying: Supervision General bed mobility comments: Pt instructed in log rolling technique   Transfers Overall transfer level: Needs assistance   Transfers: Sit to/from Stand;Stand Pivot Transfers Sit to Stand: Supervision Stand pivot transfers: Supervision            Balance Overall balance assessment: Mild deficits observed, not formally tested                                         ADL either performed or assessed with clinical judgement   ADL Overall ADL's : Needs assistance/impaired Eating/Feeding: Independent    Grooming: Wash/dry hands;Wash/dry face;Oral care;Brushing hair;Supervision/safety;Standing Grooming Details (indicate cue type and reason): reviewed safe technique for grooming  Upper Body Bathing: Set up;Supervision/ safety;Sitting   Lower Body Bathing: Supervison/ safety;Sit to/from stand   Upper Body Dressing : Set up;Sitting   Lower Body Dressing: Supervision/safety;Set up;Sit to/from stand Lower Body Dressing Details (indicate cue type and reason): able to cross ankles over knees  Toilet Transfer: Supervision/safety;Ambulation;Comfort height toilet   Toileting- Clothing Manipulation and Hygiene: Supervision/safety;Sit to/from stand   Tub/ Shower Transfer: Walk-in shower;Supervision/safety;Ambulation   Functional mobility during ADLs: Supervision/safety General ADL Comments: Pt able to don/doff brace with supervision      Vision Baseline Vision/History: Wears glasses Wears Glasses: At all times Patient Visual Report: No change from baseline       Perception     Praxis      Pertinent Vitals/Pain Pain Assessment: Faces Faces Pain Scale: Hurts little more Pain Location: back  Pain Descriptors / Indicators: Operative site guarding Pain Intervention(s): Monitored during session     Hand Dominance Right   Extremity/Trunk Assessment Upper Extremity Assessment Upper Extremity Assessment: Overall WFL for tasks assessed   Lower Extremity Assessment Lower Extremity Assessment: Defer to PT evaluation       Communication Communication Communication: No difficulties   Cognition Arousal/Alertness: Awake/alert Behavior During Therapy: WFL for tasks assessed/performed Overall Cognitive Status: Within Functional Limits for tasks assessed  General Comments  reviewed safety with IADLs     Exercises     Shoulder Instructions      Home Living Family/patient expects to be discharged to:: Private residence Living  Arrangements: Spouse/significant other Available Help at Discharge: Family;Available 24 hours/day Type of Home: House Home Access: Level entry     Home Layout: One level     Bathroom Shower/Tub: Occupational psychologist: Handicapped height     Home Equipment: Environmental consultant - 2 wheels;Wheelchair - manual          Prior Functioning/Environment Level of Independence: Independent        Comments: recent fall         OT Problem List: Pain;Decreased activity tolerance      OT Treatment/Interventions:      OT Goals(Current goals can be found in the care plan section) Acute Rehab OT Goals Patient Stated Goal: To get back to being able to do everything  OT Goal Formulation: All assessment and education complete, DC therapy  OT Frequency:     Barriers to D/C:            Co-evaluation              AM-PAC OT "6 Clicks" Daily Activity     Outcome Measure Help from another person eating meals?: None Help from another person taking care of personal grooming?: None Help from another person toileting, which includes using toliet, bedpan, or urinal?: None Help from another person bathing (including washing, rinsing, drying)?: None Help from another person to put on and taking off regular upper body clothing?: None Help from another person to put on and taking off regular lower body clothing?: None 6 Click Score: 24   End of Session Equipment Utilized During Treatment: Back brace Nurse Communication: Mobility status  Activity Tolerance: Patient tolerated treatment well Patient left: in bed;with call bell/phone within reach;with family/visitor present  OT Visit Diagnosis: Pain Pain - part of body: (back )                Time: 5093-2671 OT Time Calculation (min): 28 min Charges:  OT General Charges $OT Visit: 1 Visit OT Evaluation $OT Eval Moderate Complexity: 1 Mod OT Treatments $Self Care/Home Management : 8-22 mins  Lucille Passy, OTR/L Acute  Rehabilitation Services Pager [phone] Office [phone]   Lucille Passy M 09/17/2018, 8:42 AM

## 2018-09-17 NOTE — Discharge Summary (Signed)
Physician Discharge Summary  Patient ID: CASY TAVANO MRN: [id number] DOB/AGE: 1945/10/06 72 y.o.  Admit date: 09/16/2018 Discharge date: 09/17/2018  Admission Diagnoses: Recurrent disc herniation L4-5 left with back and left leg pain   Discharge Diagnoses: same   Discharged Condition: good  Hospital Course: The patient was admitted on 09/16/2018 and taken to the operating room where the patient underwent L4-5 PLIF. The patient tolerated the procedure well and was taken to the recovery room and then to the floor in stable condition. The hospital course was routine. There were no complications. The wound remained clean dry and intact. Pt had appropriate back soreness. No complaints of leg pain or new N/T/W. The patient remained afebrile with stable vital signs, and tolerated a regular diet. The patient continued to increase activities, and pain was well controlled with oral pain medications.   Consults: None  Significant Diagnostic Studies:  Results for orders placed or performed during the hospital encounter of 09/16/18  Glucose, capillary  Result Value Ref Range   Glucose-Capillary 158 (H) 70 - 99 mg/dL  Glucose, capillary  Result Value Ref Range   Glucose-Capillary 168 (H) 70 - 99 mg/dL  Glucose, capillary  Result Value Ref Range   Glucose-Capillary 221 (H) 70 - 99 mg/dL  Glucose, capillary  Result Value Ref Range   Glucose-Capillary 280 (H) 70 - 99 mg/dL   Comment 1 Notify RN    Comment 2 Document in Chart     Dg Lumbar Spine 2-3 Views  Result Date: 09/16/2018 CLINICAL DATA:  L4-L5 PLIF EXAM: DG C-ARM 61-120 MIN; LUMBAR SPINE - 2-3 VIEW FLUOROSCOPY TIME:  1 minute, 4 seconds COMPARISON:  Lumbar spine CT-08/28/2018; lumbar spine MRI - 07/29/2018 FINDINGS: 2 spot intraoperative fluoroscopic images of the lower lumbar spine are provided for review. Spinal labeling is in keeping with preprocedural lumbar spine MRI. Images demonstrate the sequela of L4-L5 paraspinal  fusion intervertebral disc space replacement. There has been interval restoration of the L4-L5 intervertebral disc space height. No radiopaque foreign body. IMPRESSION: Post L4-L5 paraspinal fusion intervertebral disc space replacement without evidence of complication. Electronically Signed   By: Sandi Mariscal M.D.   On: 09/16/2018 16:57   Ct Lumbar Spine Wo Contrast  Result Date: 08/28/2018 CLINICAL DATA:  Left-sided back pain EXAM: CT LUMBAR SPINE WITHOUT CONTRAST TECHNIQUE: Multidetector CT imaging of the lumbar spine was performed without intravenous contrast administration. Multiplanar CT image reconstructions were also generated. COMPARISON:  None. FINDINGS: Segmentation: 5 lumbar type vertebrae. Alignment: Normal. Vertebrae: No acute fracture or focal pathologic process. Paraspinal and other soft tissues: Negative. Disc levels: T12-L1: Unremarkable. L1-2: Unremarkable. L2-3: Mild facet hypertrophy. No spinal canal or neural foraminal stenosis. L3-L4: Disc space narrowing with small disc bulge. Mild facet hypertrophy. No spinal canal stenosis. L4-5: Left foraminal disc protrusion with encroachment on the exiting left L4 nerve root. No central spinal canal or right neural foraminal stenosis. L5-S1: Unremarkable. IMPRESSION: Left foraminal disc protrusion at L4-5 with encroachment on the exiting left L4 nerve root. Electronically Signed   By: Ulyses Jarred M.D.   On: 08/28/2018 21:54   Dg C-arm 1-60 Min  Result Date: 09/16/2018 CLINICAL DATA:  L4-L5 PLIF EXAM: DG C-ARM 61-120 MIN; LUMBAR SPINE - 2-3 VIEW FLUOROSCOPY TIME:  1 minute, 4 seconds COMPARISON:  Lumbar spine CT-08/28/2018; lumbar spine MRI - 07/29/2018 FINDINGS: 2 spot intraoperative fluoroscopic images of the lower lumbar spine are provided for review. Spinal labeling is in keeping with preprocedural lumbar spine MRI. Images demonstrate the  sequela of L4-L5 paraspinal fusion intervertebral disc space replacement. There has been interval  restoration of the L4-L5 intervertebral disc space height. No radiopaque foreign body. IMPRESSION: Post L4-L5 paraspinal fusion intervertebral disc space replacement without evidence of complication. Electronically Signed   By: Sandi Mariscal M.D.   On: 09/16/2018 16:57    Antibiotics:  Anti-infectives (From admission, onward)   Start     Dose/Rate Route Frequency Ordered Stop   09/17/18 0000  vancomycin (VANCOCIN) IVPB 750 mg/150 ml premix     750 mg 150 mL/hr over 60 Minutes Intravenous  Once 09/16/18 1826 09/17/18 0016   09/16/18 1546  bacitracin 50,000 Units in sodium chloride 0.9 % 500 mL irrigation  Status:  Discontinued       As needed 09/16/18 1546 09/16/18 1627   09/16/18 1145  vancomycin (VANCOCIN) IVPB 1000 mg/200 mL premix     1,000 mg 200 mL/hr over 60 Minutes Intravenous On call to O.R. 09/16/18 1131 09/16/18 1310      Discharge Exam: Blood pressure (!) 109/55, pulse 71, temperature 97.7 F (36.5 C), temperature source Oral, resp. rate 18, height 5\' 3"  (1.6 m), weight 78 kg, SpO2 98 %. Neurologic: Grossly normal Ambulating voiding well, incision cdi  Discharge Medications:   Allergies as of 09/17/2018      Reactions   Codeine Shortness Of Breath   Tetracyclines & Related Itching   Azithromycin Itching, Rash   Erythromycin Itching, Rash   Hydrocodone    Doesn't like to take   Morphine And Related Itching, Rash   Penicillins Itching, Rash   Has patient had a PCN reaction causing immediate rash, facial/tongue/throat swelling, SOB or lightheadedness with hypotension: Yes Has patient had a PCN reaction causing severe rash involving mucus membranes or skin necrosis: No Has patient had a PCN reaction that required hospitalization: No Has patient had a PCN reaction occurring within the last 10 years: No If all of the above answers are "NO", then may proceed with Cephalosporin use.   Septra [sulfamethoxazole-trimethoprim] Itching, Rash      Medication List    TAKE these  medications   acetaminophen 500 MG tablet Commonly known as:  TYLENOL Take 1,000 mg by mouth 2 (two) times daily as needed for moderate pain or headache.   amLODipine 10 MG tablet Commonly known as:  NORVASC Take 10 mg by mouth daily.   aspirin EC 81 MG tablet Take 81 mg by mouth daily.   cholecalciferol 1000 units tablet Commonly known as:  VITAMIN D Take 1,000 Units by mouth daily.   CRESTOR 40 MG tablet Generic drug:  rosuvastatin TAKE 1 TABLET AT BEDTIME What changed:  how much to take   fluticasone 50 MCG/ACT nasal spray Commonly known as:  FLONASE Place 2 sprays into both nostrils daily as needed for allergies.   gabapentin 300 MG capsule Commonly known as:  NEURONTIN Take 300 mg by mouth 3 (three) times daily.   HYDROcodone-acetaminophen 7.5-325 MG tablet Commonly known as:  NORCO Take 1 tablet by mouth every 4 (four) hours as needed for moderate pain. What changed:  Another medication with the same name was added. Make sure you understand how and when to take each.   HYDROcodone-acetaminophen 5-325 MG tablet Commonly known as:  NORCO/VICODIN Take 1 tablet by mouth every 4 (four) hours as needed for moderate pain. What changed:  You were already taking a medication with the same name, and this prescription was added. Make sure you understand how and when to take  each.   insulin aspart 100 UNIT/ML injection Commonly known as:  novoLOG Inject 18-34 Units into the skin 3 (three) times daily before meals.   lidocaine 5 % Commonly known as:  LIDODERM Place 1 patch onto the skin daily. Remove & Discard patch within 12 hours or as directed by MD   methocarbamol 500 MG tablet Commonly known as:  ROBAXIN Take 1 tablet (500 mg total) by mouth 2 (two) times daily. What changed:    how much to take  when to take this  reasons to take this   methocarbamol 500 MG tablet Commonly known as:  ROBAXIN Take 1 tablet (500 mg total) by mouth every 6 (six) hours as needed  for muscle spasms. What changed:  You were already taking a medication with the same name, and this prescription was added. Make sure you understand how and when to take each.   oxyCODONE 5 MG immediate release tablet Commonly known as:  Oxy IR/ROXICODONE Take 1 tablet (5 mg total) by mouth every 6 (six) hours as needed for moderate pain ((score 4 to 6)).   pantoprazole 40 MG tablet Commonly known as:  PROTONIX Take 40 mg by mouth daily.   predniSONE 10 MG tablet Commonly known as:  DELTASONE Take 20 mg by mouth daily.   venlafaxine XR 75 MG 24 hr capsule Commonly known as:  EFFEXOR-XR Take 75 mg by mouth every morning.   vitamin B-12 1000 MCG tablet Commonly known as:  CYANOCOBALAMIN Take 1,000 mcg by mouth 3 (three) times a week. Take on Sundays, Wednesdays and Fridays.            Durable Medical Equipment  (From admission, onward)         Start     Ordered   09/16/18 1810  DME Walker rolling  Once    Question:  Patient needs a walker to treat with the following condition  Answer:  S/P lumbar fusion   09/16/18 1809   09/16/18 1810  DME 3 n 1  Once     12 /18/19 1809          Disposition: home   Final Dx: plif L4-5  Discharge Instructions     Remove dressing in 72 hours   Complete by:  As directed    Call MD for:  difficulty breathing, headache or visual disturbances   Complete by:  As directed    Call MD for:  hives   Complete by:  As directed    Call MD for:  persistant dizziness or light-headedness   Complete by:  As directed    Call MD for:  persistant nausea and vomiting   Complete by:  As directed    Call MD for:  redness, tenderness, or signs of infection (pain, swelling, redness, odor or green/yellow discharge around incision site)   Complete by:  As directed    Call MD for:  severe uncontrolled pain   Complete by:  As directed    Call MD for:  temperature >100.4   Complete by:  As directed    Diet - low sodium heart healthy   Complete by:  As  directed    Driving Restrictions   Complete by:  As directed    No driving for 2 weeks, no riding in the car for 1 week   Increase activity slowly   Complete by:  As directed    Lifting restrictions   Complete by:  As directed    No lifting more  than 8 lbs      Follow-up Information    Eustace Moore, MD. Schedule an appointment as soon as possible for a visit in 2 week(s).   Specialty:  Neurosurgery Contact information: [address] [place] [address] [phone]            Signed: Ocie Cornfield [facility] 09/17/2018, 7:45 AM

## 2018-09-17 NOTE — Anesthesia Postprocedure Evaluation (Signed)
Anesthesia Post Note  Patient: MARGA GRAMAJO  Procedure(s) Performed: Lumbar Four-Five Posterior Lateral and Interbody fusion (N/A Spine Lumbar)     Patient location during evaluation: PACU Anesthesia Type: General Level of consciousness: awake and alert Pain management: pain level controlled Vital Signs Assessment: post-procedure vital signs reviewed and stable Respiratory status: spontaneous breathing, nonlabored ventilation and respiratory function stable Cardiovascular status: blood pressure returned to baseline and stable Postop Assessment: no apparent nausea or vomiting Anesthetic complications: no    Last Vitals:  Vitals:   09/17/18 0257 09/17/18 0800  BP: (!) 109/55 125/71  Pulse: 71 71  Resp: 18 16  Temp: 36.5 C 36.7 C  SpO2: 98% 100%    Last Pain:  Vitals:   09/17/18 0800  TempSrc: Oral  PainSc:                  Audry Pili

## 2018-10-03 DIAGNOSIS — R1032 Left lower quadrant pain: Secondary | ICD-10-CM | POA: Diagnosis not present

## 2018-10-05 DIAGNOSIS — R1032 Left lower quadrant pain: Secondary | ICD-10-CM | POA: Diagnosis not present

## 2018-10-07 DIAGNOSIS — E1129 Type 2 diabetes mellitus with other diabetic kidney complication: Secondary | ICD-10-CM | POA: Diagnosis not present

## 2018-10-07 DIAGNOSIS — I129 Hypertensive chronic kidney disease with stage 1 through stage 4 chronic kidney disease, or unspecified chronic kidney disease: Secondary | ICD-10-CM | POA: Diagnosis not present

## 2018-10-07 DIAGNOSIS — K5792 Diverticulitis of intestine, part unspecified, without perforation or abscess without bleeding: Secondary | ICD-10-CM | POA: Diagnosis not present

## 2018-10-07 DIAGNOSIS — M5126 Other intervertebral disc displacement, lumbar region: Secondary | ICD-10-CM | POA: Diagnosis not present

## 2018-10-07 DIAGNOSIS — N189 Chronic kidney disease, unspecified: Secondary | ICD-10-CM | POA: Diagnosis not present

## 2018-10-07 DIAGNOSIS — N052 Unspecified nephritic syndrome with diffuse membranous glomerulonephritis: Secondary | ICD-10-CM | POA: Diagnosis not present

## 2018-10-07 DIAGNOSIS — D631 Anemia in chronic kidney disease: Secondary | ICD-10-CM | POA: Diagnosis not present

## 2018-10-13 ENCOUNTER — Ambulatory Visit
Admission: RE | Admit: 2018-10-13 | Discharge: 2018-10-13 | Disposition: A | Discharge status: Home / Self Care | Payer: Medicare Other | Source: Ambulatory Visit | Attending: Internal Medicine | Admitting: Internal Medicine

## 2018-10-13 ENCOUNTER — Other Ambulatory Visit: Payer: Self-pay | Admitting: Internal Medicine

## 2018-10-13 DIAGNOSIS — R1032 Left lower quadrant pain: Secondary | ICD-10-CM

## 2018-10-13 DIAGNOSIS — K5732 Diverticulitis of large intestine without perforation or abscess without bleeding: Secondary | ICD-10-CM | POA: Diagnosis not present

## 2018-10-13 MED ORDER — IOHEXOL 300 MG/ML  SOLN
25.0000 mL | Freq: Once | INTRAMUSCULAR | Status: AC | PRN
  Administered 2018-10-13: 25 mL via ORAL

## 2018-10-15 ENCOUNTER — Other Ambulatory Visit: Payer: Self-pay

## 2018-10-15 ENCOUNTER — Inpatient Hospital Stay (HOSPITAL_COMMUNITY)
Admission: EM | Admit: 2018-10-15 | Discharge: 2018-10-20 | DRG: 392 | Disposition: A | Discharge status: Home / Self Care | Payer: PPO | Attending: Internal Medicine | Admitting: Internal Medicine

## 2018-10-15 ENCOUNTER — Encounter (HOSPITAL_COMMUNITY): Payer: Self-pay | Admitting: Emergency Medicine

## 2018-10-15 DIAGNOSIS — Z881 Allergy status to other antibiotic agents status: Secondary | ICD-10-CM

## 2018-10-15 DIAGNOSIS — R1032 Left lower quadrant pain: Secondary | ICD-10-CM | POA: Diagnosis not present

## 2018-10-15 DIAGNOSIS — K5792 Diverticulitis of intestine, part unspecified, without perforation or abscess without bleeding: Secondary | ICD-10-CM

## 2018-10-15 DIAGNOSIS — Z8051 Family history of malignant neoplasm of kidney: Secondary | ICD-10-CM

## 2018-10-15 DIAGNOSIS — Z880 Allergy status to penicillin: Secondary | ICD-10-CM | POA: Diagnosis not present

## 2018-10-15 DIAGNOSIS — K219 Gastro-esophageal reflux disease without esophagitis: Secondary | ICD-10-CM | POA: Diagnosis not present

## 2018-10-15 DIAGNOSIS — E876 Hypokalemia: Secondary | ICD-10-CM | POA: Diagnosis present

## 2018-10-15 DIAGNOSIS — Z7951 Long term (current) use of inhaled steroids: Secondary | ICD-10-CM

## 2018-10-15 DIAGNOSIS — N183 Chronic kidney disease, stage 3 unspecified: Secondary | ICD-10-CM | POA: Diagnosis present

## 2018-10-15 DIAGNOSIS — E1122 Type 2 diabetes mellitus with diabetic chronic kidney disease: Secondary | ICD-10-CM | POA: Diagnosis not present

## 2018-10-15 DIAGNOSIS — R112 Nausea with vomiting, unspecified: Secondary | ICD-10-CM

## 2018-10-15 DIAGNOSIS — E782 Mixed hyperlipidemia: Secondary | ICD-10-CM | POA: Diagnosis present

## 2018-10-15 DIAGNOSIS — Z981 Arthrodesis status: Secondary | ICD-10-CM | POA: Diagnosis not present

## 2018-10-15 DIAGNOSIS — Z90710 Acquired absence of both cervix and uterus: Secondary | ICD-10-CM

## 2018-10-15 DIAGNOSIS — Z79899 Other long term (current) drug therapy: Secondary | ICD-10-CM

## 2018-10-15 DIAGNOSIS — Z803 Family history of malignant neoplasm of breast: Secondary | ICD-10-CM

## 2018-10-15 DIAGNOSIS — Z794 Long term (current) use of insulin: Secondary | ICD-10-CM

## 2018-10-15 DIAGNOSIS — E669 Obesity, unspecified: Secondary | ICD-10-CM | POA: Diagnosis not present

## 2018-10-15 DIAGNOSIS — Z9049 Acquired absence of other specified parts of digestive tract: Secondary | ICD-10-CM

## 2018-10-15 DIAGNOSIS — Z7982 Long term (current) use of aspirin: Secondary | ICD-10-CM | POA: Diagnosis not present

## 2018-10-15 DIAGNOSIS — K5732 Diverticulitis of large intestine without perforation or abscess without bleeding: Principal | ICD-10-CM | POA: Diagnosis present

## 2018-10-15 DIAGNOSIS — I129 Hypertensive chronic kidney disease with stage 1 through stage 4 chronic kidney disease, or unspecified chronic kidney disease: Secondary | ICD-10-CM | POA: Diagnosis not present

## 2018-10-15 DIAGNOSIS — D649 Anemia, unspecified: Secondary | ICD-10-CM | POA: Diagnosis present

## 2018-10-15 DIAGNOSIS — Z885 Allergy status to narcotic agent status: Secondary | ICD-10-CM

## 2018-10-15 DIAGNOSIS — I10 Essential (primary) hypertension: Secondary | ICD-10-CM | POA: Diagnosis present

## 2018-10-15 DIAGNOSIS — E1169 Type 2 diabetes mellitus with other specified complication: Secondary | ICD-10-CM | POA: Diagnosis present

## 2018-10-15 DIAGNOSIS — Z7952 Long term (current) use of systemic steroids: Secondary | ICD-10-CM

## 2018-10-15 DIAGNOSIS — D509 Iron deficiency anemia, unspecified: Secondary | ICD-10-CM | POA: Diagnosis present

## 2018-10-15 DIAGNOSIS — Z6829 Body mass index (BMI) 29.0-29.9, adult: Secondary | ICD-10-CM | POA: Diagnosis not present

## 2018-10-15 DIAGNOSIS — F329 Major depressive disorder, single episode, unspecified: Secondary | ICD-10-CM | POA: Diagnosis not present

## 2018-10-15 DIAGNOSIS — E7800 Pure hypercholesterolemia, unspecified: Secondary | ICD-10-CM | POA: Diagnosis present

## 2018-10-15 DIAGNOSIS — Z882 Allergy status to sulfonamides status: Secondary | ICD-10-CM

## 2018-10-15 HISTORY — DX: Diverticulitis of intestine, part unspecified, without perforation or abscess without bleeding: K57.92

## 2018-10-15 LAB — GLUCOSE, CAPILLARY
GLUCOSE-CAPILLARY: 133 mg/dL — AB (ref 70–99)
Glucose-Capillary: 161 mg/dL — ABNORMAL HIGH (ref 70–99)
Glucose-Capillary: 107 mg/dL — ABNORMAL HIGH (ref 70–99)

## 2018-10-15 LAB — COMPREHENSIVE METABOLIC PANEL
ALT: 18 U/L (ref 0–44)
AST: 20 U/L (ref 15–41)
Albumin: 2.8 g/dL — ABNORMAL LOW (ref 3.5–5.0)
Alkaline Phosphatase: 116 U/L (ref 38–126)
Anion gap: 12 (ref 5–15)
BUN: 16 mg/dL (ref 8–23)
CO2: 21 mmol/L — ABNORMAL LOW (ref 22–32)
Calcium: 8.8 mg/dL — ABNORMAL LOW (ref 8.9–10.3)
Chloride: 104 mmol/L (ref 98–111)
Creatinine, Ser: 1.58 mg/dL — ABNORMAL HIGH (ref 0.44–1.00)
GFR calc Af Amer: 37 mL/min — ABNORMAL LOW (ref >60)
GFR calc non Af Amer: 32 mL/min — ABNORMAL LOW (ref >60)
Glucose, Bld: 178 mg/dL — ABNORMAL HIGH (ref 70–99)
Potassium: 3.2 mmol/L — ABNORMAL LOW (ref 3.5–5.1)
Sodium: 137 mmol/L (ref 135–145)
Total Bilirubin: 1.3 mg/dL — ABNORMAL HIGH (ref 0.3–1.2)
Total Protein: 6.5 g/dL (ref 6.5–8.1)

## 2018-10-15 LAB — CBC
HCT: 34.9 % — ABNORMAL LOW (ref 36.0–46.0)
Hemoglobin: 11.7 g/dL — ABNORMAL LOW (ref 12.0–15.0)
MCH: 29.2 pg (ref 26.0–34.0)
MCHC: 33.5 g/dL (ref 30.0–36.0)
MCV: 87 fL (ref 80.0–100.0)
NRBC: 0 % (ref 0.0–0.2)
Platelets: 165 10*3/uL (ref 150–400)
RBC: 4.01 MIL/uL (ref 3.87–5.11)
RDW: 14.4 % (ref 11.5–15.5)
WBC: 13 10*3/uL — ABNORMAL HIGH (ref 4.0–10.5)

## 2018-10-15 LAB — LIPASE, BLOOD: LIPASE: 31 U/L (ref 11–51)

## 2018-10-15 MED ORDER — AMLODIPINE BESYLATE 5 MG PO TABS: 10.0000 mg | ORAL_TABLET | Freq: Every day | ORAL | Status: DC

## 2018-10-15 MED ORDER — ASPIRIN EC 81 MG PO TBEC
81.0000 mg | DELAYED_RELEASE_TABLET | Freq: Every day | ORAL | Status: DC
  Administered 2018-10-17 – 2018-10-20 (×4): 81 mg via ORAL
  Filled 2018-10-15 (×4): qty 1

## 2018-10-15 MED ORDER — ONDANSETRON HCL 4 MG/2ML IJ SOLN
4.0000 mg | Freq: Once | INTRAMUSCULAR | Status: AC | PRN
  Administered 2018-10-15: 4 mg via INTRAVENOUS
  Filled 2018-10-15: qty 2

## 2018-10-15 MED ORDER — CIPROFLOXACIN IN D5W 400 MG/200ML IV SOLN
400.0000 mg | Freq: Once | INTRAVENOUS | Status: AC
  Administered 2018-10-15: 400 mg via INTRAVENOUS
  Filled 2018-10-15: qty 200

## 2018-10-15 MED ORDER — ONDANSETRON HCL 4 MG PO TABS
4.0000 mg | ORAL_TABLET | Freq: Four times a day (QID) | ORAL | Status: DC | PRN
  Administered 2018-10-17 – 2018-10-20 (×3): 4 mg via ORAL
  Filled 2018-10-15 (×3): qty 1

## 2018-10-15 MED ORDER — LACTATED RINGERS IV SOLN
INTRAVENOUS | Status: DC
  Administered 2018-10-15 – 2018-10-16 (×2): via INTRAVENOUS

## 2018-10-15 MED ORDER — ACETAMINOPHEN 650 MG RE SUPP: 650.0000 mg | Freq: Four times a day (QID) | RECTAL | Status: DC | PRN

## 2018-10-15 MED ORDER — VENLAFAXINE HCL ER 75 MG PO CP24
75.0000 mg | ORAL_CAPSULE | Freq: Every morning | ORAL | Status: DC
  Filled 2018-10-15: qty 1

## 2018-10-15 MED ORDER — METRONIDAZOLE IN NACL 5-0.79 MG/ML-% IV SOLN
500.0000 mg | Freq: Three times a day (TID) | INTRAVENOUS | Status: DC
  Administered 2018-10-15 – 2018-10-20 (×15): 500 mg via INTRAVENOUS
  Filled 2018-10-15 (×15): qty 100

## 2018-10-15 MED ORDER — INSULIN ASPART 100 UNIT/ML ~~LOC~~ SOLN: 0.0000 [IU] | Freq: Every day | SUBCUTANEOUS | Status: DC

## 2018-10-15 MED ORDER — ONDANSETRON HCL 4 MG/2ML IJ SOLN: 4.0000 mg | Freq: Four times a day (QID) | INTRAMUSCULAR | Status: DC | PRN

## 2018-10-15 MED ORDER — ENOXAPARIN SODIUM 40 MG/0.4ML ~~LOC~~ SOLN
40.0000 mg | SUBCUTANEOUS | Status: DC
  Administered 2018-10-15 – 2018-10-19 (×5): 40 mg via SUBCUTANEOUS
  Filled 2018-10-15 (×6): qty 0.4

## 2018-10-15 MED ORDER — PROMETHAZINE HCL 25 MG/ML IJ SOLN
12.5000 mg | Freq: Four times a day (QID) | INTRAMUSCULAR | Status: DC | PRN
  Administered 2018-10-15 – 2018-10-19 (×9): 12.5 mg via INTRAVENOUS
  Filled 2018-10-15 (×11): qty 1

## 2018-10-15 MED ORDER — ROSUVASTATIN CALCIUM 20 MG PO TABS
40.0000 mg | ORAL_TABLET | Freq: Every day | ORAL | Status: DC
  Administered 2018-10-15 – 2018-10-19 (×5): 40 mg via ORAL
  Filled 2018-10-15 (×5): qty 2

## 2018-10-15 MED ORDER — FENTANYL CITRATE (PF) 100 MCG/2ML IJ SOLN
12.5000 ug | INTRAMUSCULAR | Status: DC | PRN
  Administered 2018-10-15: 12.5 ug via INTRAVENOUS
  Administered 2018-10-16 – 2018-10-18 (×5): 25 ug via INTRAVENOUS
  Filled 2018-10-15 (×6): qty 2

## 2018-10-15 MED ORDER — ACETAMINOPHEN 325 MG PO TABS
650.0000 mg | ORAL_TABLET | Freq: Four times a day (QID) | ORAL | Status: DC | PRN
  Administered 2018-10-16 – 2018-10-17 (×3): 650 mg via ORAL
  Filled 2018-10-15 (×3): qty 2

## 2018-10-15 MED ORDER — INSULIN ASPART 100 UNIT/ML ~~LOC~~ SOLN
0.0000 [IU] | Freq: Three times a day (TID) | SUBCUTANEOUS | Status: DC
  Administered 2018-10-15: 3 [IU] via SUBCUTANEOUS
  Administered 2018-10-16 (×2): 2 [IU] via SUBCUTANEOUS
  Administered 2018-10-17: 5 [IU] via SUBCUTANEOUS
  Administered 2018-10-17: 1 [IU] via SUBCUTANEOUS
  Administered 2018-10-17 – 2018-10-18 (×3): 2 [IU] via SUBCUTANEOUS
  Administered 2018-10-18: 3 [IU] via SUBCUTANEOUS
  Administered 2018-10-19 – 2018-10-20 (×2): 2 [IU] via SUBCUTANEOUS

## 2018-10-15 MED ORDER — METRONIDAZOLE IN NACL 5-0.79 MG/ML-% IV SOLN
500.0000 mg | Freq: Once | INTRAVENOUS | Status: AC
  Administered 2018-10-15: 500 mg via INTRAVENOUS
  Filled 2018-10-15: qty 100

## 2018-10-15 MED ORDER — METRONIDAZOLE IN NACL 5-0.79 MG/ML-% IV SOLN: 500.0000 mg | Freq: Three times a day (TID) | INTRAVENOUS | Status: DC

## 2018-10-15 MED ORDER — ASPIRIN EC 81 MG PO TBEC: 81.0000 mg | DELAYED_RELEASE_TABLET | Freq: Every day | ORAL | Status: DC

## 2018-10-15 MED ORDER — VENLAFAXINE HCL ER 75 MG PO CP24
75.0000 mg | ORAL_CAPSULE | Freq: Every morning | ORAL | Status: DC
  Administered 2018-10-17 – 2018-10-20 (×4): 75 mg via ORAL
  Filled 2018-10-15 (×5): qty 1

## 2018-10-15 MED ORDER — SODIUM CHLORIDE 0.9% FLUSH: 3.0000 mL | Freq: Once | INTRAVENOUS | Status: DC

## 2018-10-15 MED ORDER — CIPROFLOXACIN IN D5W 400 MG/200ML IV SOLN: 400.0000 mg | Freq: Two times a day (BID) | INTRAVENOUS | Status: DC

## 2018-10-15 MED ORDER — CIPROFLOXACIN IN D5W 400 MG/200ML IV SOLN
400.0000 mg | Freq: Two times a day (BID) | INTRAVENOUS | Status: DC
  Administered 2018-10-15 – 2018-10-18 (×5): 400 mg via INTRAVENOUS
  Filled 2018-10-15 (×6): qty 200

## 2018-10-15 MED ORDER — SODIUM CHLORIDE 0.9 % IV BOLUS
1000.0000 mL | Freq: Once | INTRAVENOUS | Status: AC
  Administered 2018-10-15: 1000 mL via INTRAVENOUS

## 2018-10-15 NOTE — ED Notes (Signed)
Actively vomiting in triage.

## 2018-10-15 NOTE — H&P (Addendum)
History and Physical    Tina Briggs EUM:[id number] DOB: June 05, 1946 DOA: 10/15/2018  PCP: Wenda Low, MD Consultants:  Ronnald Ramp - neurosurgery; Gioffre - orthopedics; Burr Medico - hematology; Magod/Hayes - GI Patient coming from:  Home - lives with husband; NOK: Husband, [phone]  Chief Complaint: Emesis  HPI: Tina Briggs is a 73 y.o. female with medical history significant of HTN; HLD; glomerulonephritis; diverticulitis; and DM presenting with emesis. About 2 1/2 weeks ago, she went to the walk-in clinic and was told she did not have diverticulitis and did not get meds.  She went back and saw the NP and she was told she did have it and was given Cipro/Flagyl.  Took 3 days and then started vomiting.  Went back to see Dr. Lorenda Hatchet Tuesday and he told her she did have it after doing a CT and he gave her Cipro/Flagyl again with a nausea pill.  She continued to vomit and wasn't able to keep it down.  She has LLQ pain.  +diarrhea, 3 loose stools a day.  Fever, last yesterday to 101.3.   H/o prior diverticulitis, most recently in late 2019, had it 3 times last year "but this is the worst it's ever been."   ED Course:  Diverticulitis, confirmed by CT with PCP 2 days ago but clinically diagnosed last week and started on Cipro/Flagyl - unable to keep down medications or PO intake.    Review of Systems: As per HPI; otherwise review of systems reviewed and negative.   Ambulatory Status:  Ambulates without assistance  Past Medical History:  Diagnosis Date  . Anemia   . Aspiration pneumonia ([place])    after common bile duct obstruction  . CATARACT, RIGHT EYE    had repaired  . Depression   . Diverticulitis   . DM   . GERD   . Glomerulonephritis 2019  . HNP (herniated nucleus pulposus), lumbar    Recurrent  . HYPERLIPIDEMIA-MIXED   . HYPERTENSION, UNSPECIFIED   . OBESITY   . PONV (postoperative nausea and vomiting)    past history only, "sometimes they put a patch on me"    Past  Surgical History:  Procedure Laterality Date  . ABDOMINAL HYSTERECTOMY    . APPENDECTOMY    . BACK SURGERY     lumbar '09"microdiscectomy"  . CHOLECYSTECTOMY     open  . COLONOSCOPY WITH PROPOFOL N/A 11/22/2014   Procedure: COLONOSCOPY WITH PROPOFOL;  Surgeon: Garlan Fair, MD;  Location: [facility];  Service: Endoscopy;  Laterality: N/A;  . ERCP N/A 05/26/2015   Procedure: ENDOSCOPIC RETROGRADE CHOLANGIOPANCREATOGRAPHY (ERCP)   (DOING CASE IN MAIN OR);  Surgeon: Teena Irani, MD;  Location: [facility];  Service: Gastroenterology;  Laterality: N/A;  . ERCP N/A 06/06/2015   Procedure: ENDOSCOPIC RETROGRADE CHOLANGIOPANCREATOGRAPHY (ERCP);  Surgeon: Clarene Essex, MD;  Location: [facility];  Service: Endoscopy;  Laterality: N/A;  . ESOPHAGOGASTRODUODENOSCOPY (EGD) WITH PROPOFOL N/A 11/22/2014   Procedure: ESOPHAGOGASTRODUODENOSCOPY (EGD) WITH PROPOFOL;  Surgeon: Garlan Fair, MD;  Location: [facility];  Service: Endoscopy;  Laterality: N/A;  . EYE SURGERY Bilateral    cataracts  . LUMBAR FUSION  09/16/2018  . LUMBAR LAMINECTOMY/DECOMPRESSION MICRODISCECTOMY Left 12/31/2017   Procedure: Laminectomy and Foraminotomy - Lumbar Three-Four - left Extraforaminal diskectomy Lumbar Four-Five left;  Surgeon: Eustace Moore, MD;  Location: [place];  Service: Neurosurgery;  Laterality: Left;  . PERONEAL NERVE DECOMPRESSION Left 12/31/2017   Procedure: Left PERONEAL NERVE DECOMPRESSION;  Surgeon: Eustace Moore, MD;  Location:  [facility];  Service: Neurosurgery;  Laterality: Left;  left  . TUBAL LIGATION      Social History   Socioeconomic History  . Marital status: Married    Spouse name: Not on file  . Number of children: Not on file  . Years of education: Not on file  . Highest education level: Not on file  Occupational History  . Occupation: retired  Scientific laboratory technician  . Financial resource strain: Not on file  . Food insecurity:    Worry: Not on file    Inability: Not on file  . Transportation  needs:    Medical: Not on file    Non-medical: Not on file  Tobacco Use  . Smoking status: Never Smoker  . Smokeless tobacco: Never Used  Substance and Sexual Activity  . Alcohol use: No  . Drug use: No  . Sexual activity: Not on file  Lifestyle  . Physical activity:    Days per week: Not on file    Minutes per session: Not on file  . Stress: Not on file  Relationships  . Social connections:    Talks on phone: Not on file    Gets together: Not on file    Attends religious service: Not on file    Active member of club or organization: Not on file    Attends meetings of clubs or organizations: Not on file    Relationship status: Not on file  . Intimate partner violence:    Fear of current or ex partner: Not on file    Emotionally abused: Not on file    Physically abused: Not on file    Forced sexual activity: Not on file  Other Topics Concern  . Not on file  Social History Narrative  . Not on file    Allergies  Allergen Reactions  . Codeine Shortness Of Breath  . Tetracyclines & Related Itching  . Azithromycin Itching and Rash  . Erythromycin Itching and Rash  . Hydrocodone     Doesn't like to take  . Morphine And Related Itching and Rash  . Penicillins Itching and Rash    Has patient had a PCN reaction causing immediate rash, facial/tongue/throat swelling, SOB or lightheadedness with hypotension: Yes Has patient had a PCN reaction causing severe rash involving mucus membranes or skin necrosis: No Has patient had a PCN reaction that required hospitalization: No Has patient had a PCN reaction occurring within the last 10 years: No If all of the above answers are "NO", then may proceed with Cephalosporin use.   [name] [Sulfamethoxazole-Trimethoprim] Itching and Rash    Family History  Problem Relation Age of Onset  . Cancer Maternal Aunt        breast cancer  . Cancer Maternal Grandmother        kidney cancer   . Cancer Paternal Grandmother        bone cancer      Prior to Admission medications   Medication Sig Start Date End Date Taking? Authorizing Provider  acetaminophen (TYLENOL) 500 MG tablet Take 1,000 mg by mouth 2 (two) times daily as needed for moderate pain or headache.     [provider]  amLODipine (NORVASC) 10 MG tablet Take 10 mg by mouth daily.    [provider]  aspirin EC 81 MG tablet Take 81 mg by mouth daily.    [provider]  cholecalciferol (VITAMIN D) 1000 units tablet Take 1,000 Units by mouth daily.  [provider]  CRESTOR 40 MG tablet TAKE 1 TABLET AT BEDTIME Patient taking differently: Take 40 mg by mouth at bedtime.  04/19/13   Wall, Marijo Conception, MD  fluticasone (FLONASE) 50 MCG/ACT nasal spray Place 2 sprays into both nostrils daily as needed for allergies.     [provider]  gabapentin (NEURONTIN) 300 MG capsule Take 300 mg by mouth 3 (three) times daily. 08/20/18   [provider]  HYDROcodone-acetaminophen (NORCO) 7.5-325 MG tablet Take 1 tablet by mouth every 4 (four) hours as needed for moderate pain.    [provider]  HYDROcodone-acetaminophen (NORCO/VICODIN) 5-325 MG tablet Take 1 tablet by mouth every 4 (four) hours as needed for moderate pain. 09/17/18 09/17/19  Meyran, Ocie Cornfield, NP  insulin aspart (NOVOLOG) 100 UNIT/ML injection Inject 18-34 Units into the skin 3 (three) times daily before meals.     [provider]  lidocaine (LIDODERM) 5 % Place 1 patch onto the skin daily. Remove & Discard patch within 12 hours or as directed by MD Patient not taking: Reported on 09/07/2018 08/28/18   Caccavale, Sophia, PA-C  methocarbamol (ROBAXIN) 500 MG tablet Take 1 tablet (500 mg total) by mouth 2 (two) times daily. Patient taking differently: Take 750 mg by mouth 3 (three) times daily as needed for muscle spasms.  08/28/18   Caccavale, Sophia, PA-C  methocarbamol (ROBAXIN) 500 MG tablet Take 1 tablet (500 mg total) by mouth every 6 (six)  hours as needed for muscle spasms. 09/17/18   Meyran, Ocie Cornfield, NP  oxyCODONE (OXY IR/ROXICODONE) 5 MG immediate release tablet Take 1 tablet (5 mg total) by mouth every 6 (six) hours as needed for moderate pain ((score 4 to 6)). Patient not taking: Reported on 09/07/2018 01/01/18   Eustace Moore, MD  pantoprazole (PROTONIX) 40 MG tablet Take 40 mg by mouth daily.    [provider]  predniSONE (DELTASONE) 10 MG tablet Take 20 mg by mouth daily.     [provider]  venlafaxine XR (EFFEXOR-XR) 75 MG 24 hr capsule Take 75 mg by mouth every morning.     [provider]  vitamin B-12 (CYANOCOBALAMIN) 1000 MCG tablet Take 1,000 mcg by mouth 3 (three) times a week. Take on Sundays, Wednesdays and Fridays.    [provider]    Physical Exam: Vitals:   10/15/18 0700 10/15/18 0715 10/15/18 0900 10/15/18 0941  BP: 129/64 124/71 105/73 129/67  Pulse: 94 96 94 (!) 103  Resp:  16 16 18   Temp:    99.6 F (37.6 C)  TempSrc:    Oral  SpO2: 93% 94% 96% 98%  Weight:    76.2 kg  Height:    5\' 3"  (1.6 m)     . General:  Appears calm and comfortable and is NAD . Eyes:  PERRL, EOMI, normal lids, iris . ENT:  grossly normal hearing, lips & tongue, mmm; appropriate dentition . Neck:  no LAD, masses or thyromegaly . Cardiovascular:  RRR, no m/r/g. No LE edema.  Marland Kitchen Respiratory:   CTA bilaterally with no wheezes/rales/rhonchi.  Normal respiratory effort. . Abdomen:  soft, diffusely TTP but particularly in the LLQ, ND, NABS . Skin:  no rash or induration seen on limited exam . Musculoskeletal:  grossly normal tone BUE/BLE, good ROM, no bony abnormality . Psychiatric:  grossly normal mood and affect, speech fluent and appropriate, AOx3 . Neurologic:  CN 2-12 grossly intact, moves all extremities in coordinated fashion, sensation intact  Radiological Exams on Admission: Ct Abdomen Pelvis Wo Contrast  Result Date: 10/13/2018 CLINICAL DATA:  Acute onset of LEFT  LOWER QUADRANT abdominal pain. L4-5 fusion on 09/16/2018. EXAM: CT ABDOMEN AND PELVIS WITHOUT CONTRAST TECHNIQUE: Multidetector CT imaging of the abdomen and pelvis was performed following the standard protocol without IV contrast. COMPARISON:  10/28/2017 and earlier. FINDINGS: Lower chest: Chronic elevation of the LEFT hemidiaphragm. Visualized lung bases clear. Aortic annular and mitral annular calcification. Stable normal heart size. Hepatobiliary: Normal unenhanced appearance of the liver. Gas within normal caliber intrahepatic and extrahepatic bile ducts as noted previously. Surgically absent gallbladder. Pancreas: Normal unenhanced appearance. Spleen: Normal unenhanced appearance. Focus of accessory splenic tissue MEDIAL to the LOWER pole. Adrenals/Urinary Tract: Normal appearing adrenal glands. No evidence of urinary tract calculi. Within the limits of the unenhanced technique, no focal parenchymal abnormality involving either kidney. No evidence of hydronephrosis involving either kidney. No urinary tract calculi. Normal appearing decompressed urinary bladder. Stomach/Bowel: Large hiatal hernia, unchanged dating back to 2016. Stomach otherwise normal in appearance. Diverticulum arising from the MEDIAL descending duodenum with inspissated material. Adjacent diverticula arising from the transverse duodenum. Small bowel otherwise normal in appearance. Mobile cecum positioned in the RIGHT UPPER QUADRANT. Lipoma involving the ileocecal valve. Liquid stool throughout the colon. Distal descending and sigmoid colon diverticulosis with evidence of acute diverticulitis involving the distal descending/proximal sigmoid colon. No evidence of extraluminal gas or abnormal fluid collection. Appendix surgically absent. Vascular/Lymphatic: Mild aortic atherosclerosis without evidence of aneurysm. No pathologic lymphadenopathy. Reproductive: Uterus surgically absent. No adnexal masses. Other: Likely injection sites in the  subcutaneous fat of both sides of the anterior abdominal wall. Musculoskeletal: L4-5 PLIF and interbody fusion with cages, without complicating features as there is no evidence of a paraspinous fluid collection. Degenerative disc disease at L3-4. Facet degenerative changes at L5-S1. No acute findings. IMPRESSION: 1. Acute diverticulitis involving the distal descending/proximal sigmoid colon. No evidence of perforation or abscess. 2. No acute abnormalities otherwise involving the abdomen or pelvis. 3. Large hiatal hernia, unchanged dating back to 2016. 4. Aortic Atherosclerosis, mild. (ICD10-I70.0). Electronically Signed   By: Evangeline Dakin M.D.   On: 10/13/2018 16:28    EKG: not done   Labs on Admission: I have personally reviewed the available labs and imaging studies at the time of the admission.  Pertinent labs:   K+ 3.2 CO2 21 Glucose 178 BUN 16/Creatinine 1.58/GFR 32; 32/1.45/36 on 09/09/18 Albumin 2.8 WBC 13.0 Hgb 11.7; 14.6 on 09/09/18   Assessment/Plan Principal Problem:   Diverticulitis Active Problems:   Essential hypertension   Hypercholesterolemia   Diabetes mellitus type 2 in obese (HCC)   Anemia   Chronic kidney disease, stage 3 ([facility]CC)   Diverticulitis -Patient's symptoms are c/w diverticulitis and her CT supports this as a diagnosis -For now, will give bowel rest, IVF, pain medication with morphine, nausea medication with Zofran/phenergan, and treat with Cipro/Flagyl (due to PCN allergy, cannot have Zosyn) -She has failed outpatient management with Cipro/Flagyl, but this appears to be mainly due to persistent n/v and inability to tolerate PO medications rather than true treatment failure -Assuming she improves quickly with IV antibiotics, she may be able to advance diet and go home tomorrow  DM -Most recent A1c showed reasonable control (6.8) in 12/19 -She appears to use only SSI for diabetes control, which is atyipcal -Cover with moderate-scale SSI -Consider  basal dosing  HTN -Continue Norvasc - but ok to hold today's dose since her BP is low normal and she  is continuing to have nausea  HLD -Continue Crestor  CKD -Reasonably stable -Will hydrate and recheck in AM  Anemia -Stable -Repeat CBC in AM   DVT prophylaxis:  Lovenox  Code Status:  Full - confirmed with patient/family Family Communication: Husband present throughout evaluation  Disposition Plan:  Home once clinically improved Consults called: None  Admission status: It is my clinical opinion that referral for OBSERVATION is reasonable and necessary in this patient based on the above information provided. The aforementioned taken together are felt to place the patient at high risk for further clinical deterioration. However it is anticipated that the patient may be medically stable for discharge from the hospital within 24 to 48 hours.    Karmen Bongo MD [facility]  If note is complete, please contact covering daytime or nighttime physician. [email or web address]   10/15/2018, 10:16 AM

## 2018-10-15 NOTE — ED Notes (Signed)
Pt aware that urine sample is needed, but is unable to provide one at this time 

## 2018-10-15 NOTE — ED Provider Notes (Signed)
[facility] EMERGENCY DEPARTMENT Provider Note  CSN: [id number] Arrival date & time: 10/15/18 0516  Chief Complaint(s) Emesis  HPI Tina Briggs is a 73 y.o. female with a history of diabetes and prior diverticulitis who is currently being treated for diverticulitis presents with emesis and inability to tolerate oral intake.  She reports that she was diagnosed with recurrent diverticulitis clinically last week and placed on Cipro/Flagyl, which she reports worked during her previous episodes.  She reports that she was unable to tolerate the antibiotics.  She followed up with her primary care doctor 2 days ago and had a CT confirming the diverticulitis.  She was placed back on Cipro/Flagyl and given Zofran.  She is still unable to tolerate any oral intake.  She endorses fever of 101.3 several days ago.  She continues to have left lower quadrant abdominal pain exacerbated with movement, palpation and emesis.  No alleviating factors.  Patient still having bowel movements and passing gas.  Denies any other physical complaints.  HPI     Past Medical History Past Medical History:  Diagnosis Date  . Anemia   . Aspiration pneumonia ([place])    after common bile duct obstruction  . CATARACT, RIGHT EYE    had repaired  . Depression   . DM   . GERD   . Glomerulonephritis 2019  . HNP (herniated nucleus pulposus), lumbar    Recurrent  . HYPERLIPIDEMIA-MIXED   . HYPERTENSION, UNSPECIFIED   . OBESITY   . PONV (postoperative nausea and vomiting)    past history only, "sometimes they put a patch on me"   Patient Active Problem List   Diagnosis Date Noted  . S/P lumbar fusion 09/16/2018  . S/P lumbar laminectomy 12/31/2017  . Elevated serum immunoglobulin free light chain level 08/26/2017  . Chronic kidney disease, stage 3 ([place]) 06/07/2015  . Choledocholithiasis 06/07/2015  . Aspiration pneumonitis ([place]) 06/06/2015  . Anemia 06/06/2015  . Diabetes mellitus type 2 in  obese ([place]) 05/25/2015  . Elevated liver enzymes 05/24/2015  . RUQ abdominal pain 05/24/2015  . Hypercholesterolemia 10/27/2014  . HYPERLIPIDEMIA-MIXED 02/15/2009  . Obesity 02/15/2009  . CATARACT, RIGHT EYE 02/15/2009  . Essential hypertension 02/15/2009  . GERD 02/15/2009   Home Medication(s) Prior to Admission medications   Medication Sig Start Date End Date Taking? Authorizing Provider  acetaminophen (TYLENOL) 500 MG tablet Take 1,000 mg by mouth 2 (two) times daily as needed for moderate pain or headache.     [provider]  amLODipine (NORVASC) 10 MG tablet Take 10 mg by mouth daily.    [provider]  aspirin EC 81 MG tablet Take 81 mg by mouth daily.    [provider]  cholecalciferol (VITAMIN D) 1000 units tablet Take 1,000 Units by mouth daily.    [provider]  CRESTOR 40 MG tablet TAKE 1 TABLET AT BEDTIME Patient taking differently: Take 40 mg by mouth at bedtime.  04/19/13   Wall, Marijo Conception, MD  fluticasone (FLONASE) 50 MCG/ACT nasal spray Place 2 sprays into both nostrils daily as needed for allergies.     [provider]  gabapentin (NEURONTIN) 300 MG capsule Take 300 mg by mouth 3 (three) times daily. 08/20/18   [provider]  HYDROcodone-acetaminophen (NORCO) 7.5-325 MG tablet Take 1 tablet by mouth every 4 (four) hours as needed for moderate pain.    [provider]  HYDROcodone-acetaminophen (NORCO/VICODIN) 5-325 MG tablet Take 1 tablet by mouth every 4 (four) hours  as needed for moderate pain. 09/17/18 09/17/19  Meyran, Ocie Cornfield, NP  insulin aspart (NOVOLOG) 100 UNIT/ML injection Inject 18-34 Units into the skin 3 (three) times daily before meals.     [provider]  lidocaine (LIDODERM) 5 % Place 1 patch onto the skin daily. Remove & Discard patch within 12 hours or as directed by MD Patient not taking: Reported on 09/07/2018 08/28/18   Caccavale, Sophia, PA-C  methocarbamol (ROBAXIN)  500 MG tablet Take 1 tablet (500 mg total) by mouth 2 (two) times daily. Patient taking differently: Take 750 mg by mouth 3 (three) times daily as needed for muscle spasms.  08/28/18   Caccavale, Sophia, PA-C  methocarbamol (ROBAXIN) 500 MG tablet Take 1 tablet (500 mg total) by mouth every 6 (six) hours as needed for muscle spasms. 09/17/18   Meyran, Ocie Cornfield, NP  oxyCODONE (OXY IR/ROXICODONE) 5 MG immediate release tablet Take 1 tablet (5 mg total) by mouth every 6 (six) hours as needed for moderate pain ((score 4 to 6)). Patient not taking: Reported on 09/07/2018 01/01/18   Eustace Moore, MD  pantoprazole (PROTONIX) 40 MG tablet Take 40 mg by mouth daily.    [provider]  predniSONE (DELTASONE) 10 MG tablet Take 20 mg by mouth daily.     [provider]  venlafaxine XR (EFFEXOR-XR) 75 MG 24 hr capsule Take 75 mg by mouth every morning.     [provider]  vitamin B-12 (CYANOCOBALAMIN) 1000 MCG tablet Take 1,000 mcg by mouth 3 (three) times a week. Take on Sundays, Wednesdays and Fridays.    [provider]                                                                                                                                    Past Surgical History Past Surgical History:  Procedure Laterality Date  . ABDOMINAL HYSTERECTOMY    . APPENDECTOMY    . BACK SURGERY     lumbar '09"microdiscectomy"  . CHOLECYSTECTOMY     open  . COLONOSCOPY WITH PROPOFOL N/A 11/22/2014   Procedure: COLONOSCOPY WITH PROPOFOL;  Surgeon: Garlan Fair, MD;  Location: [facility];  Service: Endoscopy;  Laterality: N/A;  . ERCP N/A 05/26/2015   Procedure: ENDOSCOPIC RETROGRADE CHOLANGIOPANCREATOGRAPHY (ERCP)   (DOING CASE IN MAIN OR);  Surgeon: Teena Irani, MD;  Location: [facility];  Service: Gastroenterology;  Laterality: N/A;  . ERCP N/A 06/06/2015   Procedure: ENDOSCOPIC RETROGRADE CHOLANGIOPANCREATOGRAPHY (ERCP);  Surgeon: Clarene Essex, MD;  Location: [facility]OS[facility];  Service: Endoscopy;  Laterality: N/A;  . ESOPHAGOGASTRODUODENOSCOPY (EGD) WITH PROPOFOL N/A 11/22/2014   Procedure: ESOPHAGOGASTRODUODENOSCOPY (EGD) WITH PROPOFOL;  Surgeon: Garlan Fair, MD;  Location: [facility];  Service: Endoscopy;  Laterality: N/A;  . EYE SURGERY Bilateral    cataracts  . LUMBAR LAMINECTOMY/DECOMPRESSION MICRODISCECTOMY Left 12/31/2017   Procedure: Laminectomy and Foraminotomy - Lumbar Three-Four - left Extraforaminal diskectomy  Lumbar Four-Five left;  Surgeon: Eustace Moore, MD;  Location: [facility];  Service: Neurosurgery;  Laterality: Left;  . PERONEAL NERVE DECOMPRESSION Left 12/31/2017   Procedure: Left PERONEAL NERVE DECOMPRESSION;  Surgeon: Eustace Moore, MD;  Location: [facility];  Service: Neurosurgery;  Laterality: Left;  left  . TUBAL LIGATION     Family History Family History  Problem Relation Age of Onset  . Cancer Maternal Aunt        breast cancer  . Cancer Maternal Grandmother        kidney cancer   . Cancer Paternal Grandmother        bone cancer     Social History Social History   Tobacco Use  . Smoking status: Never Smoker  . Smokeless tobacco: Never Used  Substance Use Topics  . Alcohol use: No  . Drug use: No   Allergies Codeine; Tetracyclines & related; Azithromycin; Erythromycin; Hydrocodone; Morphine and related; Penicillins; and Septra [sulfamethoxazole-trimethoprim]  Review of Systems Review of Systems All other systems are reviewed and are negative for acute change except as noted in the HPI  Physical Exam Vital Signs  I have reviewed the triage vital signs BP 133/67   Pulse 99   Temp 98.2 F (36.8 C) (Oral)   Resp 18   Ht 5\' 3"  (1.6 m)   Wt 77.1 kg   SpO2 97%   BMI 30.11 kg/m   Physical Exam Vitals signs reviewed.  Constitutional:      General: She is not in acute distress.    Appearance: She is well-developed. She is not diaphoretic.  HENT:     Head: Normocephalic and atraumatic.     Right Ear:  External ear normal.     Left Ear: External ear normal.     Nose: Nose normal.  Eyes:     General: No scleral icterus.    Conjunctiva/sclera: Conjunctivae normal.  Neck:     Musculoskeletal: Normal range of motion.     Trachea: Phonation normal.  Cardiovascular:     Rate and Rhythm: Normal rate and regular rhythm.  Pulmonary:     Effort: Pulmonary effort is normal. No respiratory distress.     Breath sounds: No stridor.  Abdominal:     General: There is no distension.     Tenderness: There is abdominal tenderness in the left lower quadrant. There is no guarding or rebound.  Musculoskeletal: Normal range of motion.  Neurological:     Mental Status: She is alert and oriented to person, place, and time.  Psychiatric:        Behavior: Behavior normal.     ED Results and Treatments Labs (all labs ordered are listed, but only abnormal results are displayed) Labs Reviewed  LIPASE, BLOOD  COMPREHENSIVE METABOLIC PANEL  CBC  URINALYSIS, ROUTINE W REFLEX MICROSCOPIC  EKG  EKG Interpretation  Date/Time:    Ventricular Rate:    PR Interval:    QRS Duration:   QT Interval:    QTC Calculation:   R Axis:     Text Interpretation:        Radiology No results found. Pertinent labs & imaging results that were available during my care of the patient were reviewed by me and considered in my medical decision making (see chart for details).  Medications Ordered in ED Medications  sodium chloride flush (NS) 0.9 % injection 3 mL (3 mLs Intravenous Not Given 10/15/18 0624)  ciprofloxacin (CIPRO) IVPB 400 mg (has no administration in time range)  metroNIDAZOLE (FLAGYL) IVPB 500 mg (has no administration in time range)  sodium chloride 0.9 % bolus 1,000 mL (1,000 mLs Intravenous New Bag/Given 10/15/18 0623)  ondansetron (ZOFRAN) injection 4 mg (4 mg Intravenous Given  10/15/18 0600)                                                                                                                                    Procedures Procedures  (including critical care time)  Medical Decision Making / ED Course I have reviewed the nursing notes for this encounter and the patient's prior records (if available in EHR or on provided paperwork).    Intractable nausea and vomiting in the setting of diverticulitis.  She is unable to tolerate her oral antibiotics.  Will likely need admission for IV antibiotics.  Screening labs revealed leukocytosis.  Stable renal function.  Admitted to medicine for continued management.    Final Clinical Impression(s) / ED Diagnoses Final diagnoses:  Diverticulitis  Intractable nausea and vomiting      This chart was dictated using voice recognition software.  Despite best efforts to proofread,  errors can occur which can change the documentation meaning.   Fatima Blank, MD 10/15/18 [phone]

## 2018-10-15 NOTE — ED Triage Notes (Signed)
Pt reports she was dx with diverticulitis Tuesday. Has had continued abd pain, N/V/D. Has had no relief with her prescription meds.

## 2018-10-15 NOTE — Progress Notes (Signed)
Pt admitted to room 5C12 from ED s/p nausea and vomiting related to diverticulitis. Pt oriented to room. Pt alert and oriented.

## 2018-10-16 DIAGNOSIS — Z79899 Other long term (current) drug therapy: Secondary | ICD-10-CM | POA: Diagnosis not present

## 2018-10-16 DIAGNOSIS — Z981 Arthrodesis status: Secondary | ICD-10-CM | POA: Diagnosis not present

## 2018-10-16 DIAGNOSIS — Z7951 Long term (current) use of inhaled steroids: Secondary | ICD-10-CM | POA: Diagnosis not present

## 2018-10-16 DIAGNOSIS — F329 Major depressive disorder, single episode, unspecified: Secondary | ICD-10-CM | POA: Diagnosis present

## 2018-10-16 DIAGNOSIS — Z881 Allergy status to other antibiotic agents status: Secondary | ICD-10-CM | POA: Diagnosis not present

## 2018-10-16 DIAGNOSIS — Z794 Long term (current) use of insulin: Secondary | ICD-10-CM | POA: Diagnosis not present

## 2018-10-16 DIAGNOSIS — E669 Obesity, unspecified: Secondary | ICD-10-CM

## 2018-10-16 DIAGNOSIS — N183 Chronic kidney disease, stage 3 (moderate): Secondary | ICD-10-CM | POA: Diagnosis present

## 2018-10-16 DIAGNOSIS — Z90710 Acquired absence of both cervix and uterus: Secondary | ICD-10-CM | POA: Diagnosis not present

## 2018-10-16 DIAGNOSIS — E1169 Type 2 diabetes mellitus with other specified complication: Secondary | ICD-10-CM

## 2018-10-16 DIAGNOSIS — E782 Mixed hyperlipidemia: Secondary | ICD-10-CM | POA: Diagnosis present

## 2018-10-16 DIAGNOSIS — Z8051 Family history of malignant neoplasm of kidney: Secondary | ICD-10-CM | POA: Diagnosis not present

## 2018-10-16 DIAGNOSIS — Z6829 Body mass index (BMI) 29.0-29.9, adult: Secondary | ICD-10-CM | POA: Diagnosis not present

## 2018-10-16 DIAGNOSIS — I10 Essential (primary) hypertension: Secondary | ICD-10-CM

## 2018-10-16 DIAGNOSIS — Z803 Family history of malignant neoplasm of breast: Secondary | ICD-10-CM | POA: Diagnosis not present

## 2018-10-16 DIAGNOSIS — Z7982 Long term (current) use of aspirin: Secondary | ICD-10-CM | POA: Diagnosis not present

## 2018-10-16 DIAGNOSIS — K5792 Diverticulitis of intestine, part unspecified, without perforation or abscess without bleeding: Secondary | ICD-10-CM | POA: Diagnosis present

## 2018-10-16 DIAGNOSIS — K219 Gastro-esophageal reflux disease without esophagitis: Secondary | ICD-10-CM | POA: Diagnosis present

## 2018-10-16 DIAGNOSIS — D509 Iron deficiency anemia, unspecified: Secondary | ICD-10-CM | POA: Diagnosis present

## 2018-10-16 DIAGNOSIS — Z880 Allergy status to penicillin: Secondary | ICD-10-CM | POA: Diagnosis not present

## 2018-10-16 DIAGNOSIS — E876 Hypokalemia: Secondary | ICD-10-CM | POA: Diagnosis present

## 2018-10-16 DIAGNOSIS — Z9049 Acquired absence of other specified parts of digestive tract: Secondary | ICD-10-CM | POA: Diagnosis not present

## 2018-10-16 DIAGNOSIS — K5732 Diverticulitis of large intestine without perforation or abscess without bleeding: Secondary | ICD-10-CM | POA: Diagnosis present

## 2018-10-16 DIAGNOSIS — Z7952 Long term (current) use of systemic steroids: Secondary | ICD-10-CM | POA: Diagnosis not present

## 2018-10-16 DIAGNOSIS — D649 Anemia, unspecified: Secondary | ICD-10-CM

## 2018-10-16 DIAGNOSIS — I129 Hypertensive chronic kidney disease with stage 1 through stage 4 chronic kidney disease, or unspecified chronic kidney disease: Secondary | ICD-10-CM | POA: Diagnosis present

## 2018-10-16 DIAGNOSIS — E1122 Type 2 diabetes mellitus with diabetic chronic kidney disease: Secondary | ICD-10-CM | POA: Diagnosis present

## 2018-10-16 LAB — CBC
HEMATOCRIT: 28.1 % — AB (ref 36.0–46.0)
HEMOGLOBIN: 9.5 g/dL — AB (ref 12.0–15.0)
MCH: 29.3 pg (ref 26.0–34.0)
MCHC: 33.8 g/dL (ref 30.0–36.0)
MCV: 86.7 fL (ref 80.0–100.0)
Platelets: 146 10*3/uL — ABNORMAL LOW (ref 150–400)
RBC: 3.24 MIL/uL — ABNORMAL LOW (ref 3.87–5.11)
RDW: 14.4 % (ref 11.5–15.5)
WBC: 8.1 10*3/uL (ref 4.0–10.5)
nRBC: 0 % (ref 0.0–0.2)

## 2018-10-16 LAB — BASIC METABOLIC PANEL
Anion gap: 9 (ref 5–15)
BUN: 12 mg/dL (ref 8–23)
CO2: 22 mmol/L (ref 22–32)
Calcium: 7.9 mg/dL — ABNORMAL LOW (ref 8.9–10.3)
Chloride: 108 mmol/L (ref 98–111)
Creatinine, Ser: 1.33 mg/dL — ABNORMAL HIGH (ref 0.44–1.00)
GFR calc Af Amer: 46 mL/min — ABNORMAL LOW (ref >60)
GFR calc non Af Amer: 40 mL/min — ABNORMAL LOW (ref >60)
Glucose, Bld: 120 mg/dL — ABNORMAL HIGH (ref 70–99)
Potassium: 3 mmol/L — ABNORMAL LOW (ref 3.5–5.1)
Sodium: 139 mmol/L (ref 135–145)

## 2018-10-16 LAB — GLUCOSE, CAPILLARY
Glucose-Capillary: 110 mg/dL — ABNORMAL HIGH (ref 70–99)
Glucose-Capillary: 104 mg/dL — ABNORMAL HIGH (ref 70–99)
Glucose-Capillary: 111 mg/dL — ABNORMAL HIGH (ref 70–99)
Glucose-Capillary: 142 mg/dL — ABNORMAL HIGH (ref 70–99)
Glucose-Capillary: 124 mg/dL — ABNORMAL HIGH (ref 70–99)
Glucose-Capillary: 130 mg/dL — ABNORMAL HIGH (ref 70–99)
Glucose-Capillary: 133 mg/dL — ABNORMAL HIGH (ref 70–99)

## 2018-10-16 LAB — MAGNESIUM: Magnesium: 1.4 mg/dL — ABNORMAL LOW (ref 1.7–2.4)

## 2018-10-16 MED ORDER — DIPHENHYDRAMINE HCL 25 MG PO CAPS
25.0000 mg | ORAL_CAPSULE | Freq: Once | ORAL | Status: AC
  Administered 2018-10-16: 25 mg via ORAL
  Filled 2018-10-16: qty 1

## 2018-10-16 MED ORDER — POTASSIUM CHLORIDE IN NACL 40-0.9 MEQ/L-% IV SOLN
INTRAVENOUS | Status: DC
  Administered 2018-10-16 – 2018-10-18 (×4): 50 mL/h via INTRAVENOUS
  Filled 2018-10-16 (×3): qty 1000

## 2018-10-16 NOTE — Progress Notes (Signed)
Pt transferred to 5W32. Report given to RN.

## 2018-10-16 NOTE — Progress Notes (Signed)
Tina Briggs is a 73 y.o. female patient admitted from ED awake, alert - oriented  X 4 - no acute distress noted.  VSS - Blood pressure 120/76, pulse 87, temperature 98.2 F (36.8 C), temperature source Oral, resp. rate 18, height 5\' 3"  (1.6 m), weight 76.2 kg, SpO2 95 %.    IV in place, occlusive dsg intact without redness.  Orientation to room, and floor completed with information packet given to patient/family.  Patient declined safety video at this time.  Admission INP armband ID verified with patient/family, and in place.   SR up x 2, fall assessment complete, with patient and family able to verbalize understanding of risk associated with falls, and verbalized understanding to call nsg before up out of bed.    Call light within reach, patient able to voice, and demonstrate understanding.  Skin, clean-dry- intact without evidence of bruising, or skin tears.   No evidence of skin break down noted on exam.   Will cont to eval and treat per MD orders.  Howard Pouch, RN 10/16/2018 8:39 PM

## 2018-10-16 NOTE — Progress Notes (Signed)
Progress Note    Tina Briggs  YBO:[id number] DOB: 05-Oct-1945  DOA: 10/15/2018 PCP: Tina Low, MD    Brief Narrative:     Medical records reviewed and are as summarized below:  Tina Briggs is an 73 y.o. female with medical history significant of HTN; HLD; glomerulonephritis; diverticulitis; and DM presenting with emesis. About 2 1/2 weeks ago, she went to the walk-in clinic and was told she did not have diverticulitis and did not get meds.  She went back and saw the NP and she was told she did have it and was given Cipro/Flagyl.  Took 3 days and then started vomiting.  Went back to see Dr. Lorenda Hatchet Tuesday and he told her she did have it after doing a CT and he gave her Cipro/Flagyl again with a nausea pill.  She continued to vomit and wasn't able to keep it down.   Assessment/Plan:   Principal Problem:   Diverticulitis Active Problems:   Essential hypertension   Hypercholesterolemia   Diabetes mellitus type 2 in obese ([facility])   Anemia   Chronic kidney disease, stage 3 ([facility])  Acute Diverticulitis -Failed outpatient treatment of p.o. Cipro Flagyl - We will continue IV Cipro/Flagyl for now as she is not taking in p.o. consistently - She is having pain with movement and eating - White blood cell count trending down -We will start ice chips and sips of liquid, if tolerating will then advance to clear liquid diet -Continue IV fluids but change to include potassium - We will hold p.o. medications such as her Effexor until she is able to tolerate oral intake -CT scan was done on 10-13-2018: we will hold on repeating any imaging as I am not convinced that she is formed an abscess or perforation in the meantime  Diabetes -Hemoglobin A1c of 6.8 in December 2019 -Sliding scale insulin  Hypokalemia We will change IV fluids to replete and check a magnesium  Hypertension - Blood pressure on lower end of normal so we will hold p.o. Norvasc  Anemia -Unknown  cause -1 month ago her hemoglobin was 14, the prior was 10 -Monitor for diverticular bleeding -Fe studies for the AM  As patient has failed outpatient antibiotics and is slow to recover from this episode of diverticulitis I suspect that she will need several more days in the hospital and will meet inpatient criteria  Family Communication/Anticipated D/C date and plan/Code Status   DVT prophylaxis: Lovenox ordered. Code Status: Full Code.  Family Communication: At bedside Disposition Plan: Suspect several more days in the hospital getting IV fluids and IV antibiotics   Medical Consultants:    None.     Subjective:   Continues to have pain with movement and nausea as well  Objective:    Vitals:   10/15/18 0941 10/15/18 1151 10/15/18 2130 10/16/18 0420  BP: 129/67 128/63 119/70 (!) 116/59  Pulse: (!) 103 88 98 91  Resp: 18     Temp: 99.6 F (37.6 C) 100 F (37.8 C) 99.6 F (37.6 C) 98.6 F (37 C)  TempSrc: Oral Oral Oral Oral  SpO2: 98% 95% (!) 88% 91%  Weight: 76.2 kg     Height: 5\' 3"  (1.6 m)       Intake/Output Summary (Last 24 hours) at 10/16/2018 1031 Last data filed at 10/16/2018 0700 Gross per 24 hour  Intake 240 ml  Output -  Net 240 ml   Filed Weights   10/15/18 0524 10/15/18 0941  Weight:  77.1 kg 76.2 kg    Exam:  General Appearance:    Alert, cooperative, appears uncomfortable, appears stated age  Throat:   Lips, mucosa, and tongue dry  Lungs:     Clear to auscultation bilaterally, respirations unlabored   Heart:    Regular rate and rhythm, S1 and S2 normal, no murmur, rub   or gallop  Abdomen:     Soft, tender especially in the LLQ-- no rebound  Extremities:   Extremities normal, atraumatic, no cyanosis or edema  Pulses:   2+ and symmetric all extremities       Data Reviewed:   I have personally reviewed following labs and imaging studies:  Labs: Labs show the following:   Basic Metabolic Panel: Recent Labs  Lab 10/15/18 0545  10/16/18 0725  NA 137 139  K 3.2* 3.0*  CL 104 108  CO2 21* 22  GLUCOSE 178* 120*  BUN 16 12  CREATININE 1.58* 1.33*  CALCIUM 8.8* 7.9*   GFR Estimated Creatinine Clearance: 37.4 mL/min (A) (by C-G formula based on SCr of 1.33 mg/dL (H)). Liver Function Tests: Recent Labs  Lab 10/15/18 0545  AST 20  ALT 18  ALKPHOS 116  BILITOT 1.3*  PROT 6.5  ALBUMIN 2.8*   Recent Labs  Lab 10/15/18 0545  LIPASE 31   No results for input(s): AMMONIA in the last 168 hours. Coagulation profile No results for input(s): INR, PROTIME in the last 168 hours.  CBC: Recent Labs  Lab 10/15/18 0545 10/16/18 0725  WBC 13.0* 8.1  HGB 11.7* 9.5*  HCT 34.9* 28.1*  MCV 87.0 86.7  PLT 165 146*   Cardiac Enzymes: No results for input(s): CKTOTAL, CKMB, CKMBINDEX, TROPONINI in the last 168 hours. BNP (last 3 results) No results for input(s): PROBNP in the last 8760 hours. CBG: Recent Labs  Lab 10/15/18 1635 10/15/18 2128 10/16/18 0116 10/16/18 0418 10/16/18 0722  GLUCAP 107* 133* 110* 104* 111*   D-Dimer: No results for input(s): DDIMER in the last 72 hours. Hgb A1c: No results for input(s): HGBA1C in the last 72 hours. Lipid Profile: No results for input(s): CHOL, HDL, LDLCALC, TRIG, CHOLHDL, LDLDIRECT in the last 72 hours. Thyroid function studies: No results for input(s): TSH, T4TOTAL, T3FREE, THYROIDAB in the last 72 hours.  Invalid input(s): FREET3 Anemia work up: No results for input(s): VITAMINB12, FOLATE, FERRITIN, TIBC, IRON, RETICCTPCT in the last 72 hours. Sepsis Labs: Recent Labs  Lab 10/15/18 0545 10/16/18 0725  WBC 13.0* 8.1    Microbiology No results found for this or any previous visit (from the past 240 hour(s)).  Procedures and diagnostic studies:  No results found.  Medications:   . amLODipine  10 mg Oral Daily  . aspirin EC  81 mg Oral Daily  . enoxaparin (LOVENOX) injection  40 mg Subcutaneous Q24H  . insulin aspart  0-15 Units Subcutaneous  TID WC  . insulin aspart  0-5 Units Subcutaneous QHS  . rosuvastatin  40 mg Oral QHS  . venlafaxine XR  75 mg Oral q morning - 10a   Continuous Infusions: . ciprofloxacin 400 mg (10/16/18 0832)  . lactated ringers 100 mL/hr at 10/16/18 0038  . metronidazole 500 mg (10/16/18 2992)     LOS: 0 days   Geradine Girt  [facility]   *Please refer to [email or web address], password [email or web address] to get updated schedule on who will round on this patient, as hospitalists switch teams weekly. If 7PM-7AM, please contact night-coverage at [email or web address], password [facility] for any  overnight needs.  10/16/2018, 10:31 AM

## 2018-10-17 LAB — BASIC METABOLIC PANEL
Anion gap: 9 (ref 5–15)
BUN: 7 mg/dL — ABNORMAL LOW (ref 8–23)
CALCIUM: 7.9 mg/dL — AB (ref 8.9–10.3)
CO2: 24 mmol/L (ref 22–32)
CREATININE: 1.13 mg/dL — AB (ref 0.44–1.00)
Chloride: 109 mmol/L (ref 98–111)
GFR calc Af Amer: 56 mL/min — ABNORMAL LOW (ref >60)
GFR calc non Af Amer: 49 mL/min — ABNORMAL LOW (ref >60)
Glucose, Bld: 144 mg/dL — ABNORMAL HIGH (ref 70–99)
Potassium: 3.2 mmol/L — ABNORMAL LOW (ref 3.5–5.1)
Sodium: 142 mmol/L (ref 135–145)

## 2018-10-17 LAB — CBC
HCT: 30.5 % — ABNORMAL LOW (ref 36.0–46.0)
Hemoglobin: 10.1 g/dL — ABNORMAL LOW (ref 12.0–15.0)
MCH: 28.5 pg (ref 26.0–34.0)
MCHC: 33.1 g/dL (ref 30.0–36.0)
MCV: 86.2 fL (ref 80.0–100.0)
Platelets: 165 10*3/uL (ref 150–400)
RBC: 3.54 MIL/uL — ABNORMAL LOW (ref 3.87–5.11)
RDW: 14.4 % (ref 11.5–15.5)
WBC: 8 10*3/uL (ref 4.0–10.5)
nRBC: 0 % (ref 0.0–0.2)

## 2018-10-17 LAB — FERRITIN: Ferritin: 125 ng/mL (ref 11–307)

## 2018-10-17 LAB — GLUCOSE, CAPILLARY
Glucose-Capillary: 132 mg/dL — ABNORMAL HIGH (ref 70–99)
Glucose-Capillary: 141 mg/dL — ABNORMAL HIGH (ref 70–99)
Glucose-Capillary: 210 mg/dL — ABNORMAL HIGH (ref 70–99)
Glucose-Capillary: 127 mg/dL — ABNORMAL HIGH (ref 70–99)
Glucose-Capillary: 147 mg/dL — ABNORMAL HIGH (ref 70–99)

## 2018-10-17 LAB — IRON AND TIBC
Iron: 21 ug/dL — ABNORMAL LOW (ref 28–170)
Saturation Ratios: 10 % — ABNORMAL LOW (ref 10.4–31.8)
TIBC: 217 ug/dL — ABNORMAL LOW (ref 250–450)
UIBC: 196 ug/dL

## 2018-10-17 MED ORDER — MAGNESIUM SULFATE IN D5W 1-5 GM/100ML-% IV SOLN
1.0000 g | Freq: Once | INTRAVENOUS | Status: AC
  Administered 2018-10-17: 1 g via INTRAVENOUS
  Filled 2018-10-17 (×2): qty 100

## 2018-10-17 MED ORDER — POTASSIUM CHLORIDE CRYS ER 20 MEQ PO TBCR
40.0000 meq | EXTENDED_RELEASE_TABLET | Freq: Once | ORAL | Status: AC
  Administered 2018-10-17: 40 meq via ORAL
  Filled 2018-10-17: qty 2

## 2018-10-17 NOTE — Progress Notes (Signed)
Progress Note    HAZELGRACE Briggs  YFV:[id number] DOB: 20-Nov-1945  DOA: 10/15/2018 PCP: Wenda Low, MD    Brief Narrative:     Medical records reviewed and are as summarized below:  Tina Briggs is an 73 y.o. female with medical history significant of HTN; HLD; glomerulonephritis; diverticulitis; and DM presenting with emesis. About 2 1/2 weeks ago, she went to the walk-in clinic and was told she did not have diverticulitis and did not get meds.  She went back and saw the NP and she was told she did have it and was given Cipro/Flagyl.  Took 3 days and then started vomiting.  Went back to see Dr. Lorenda Hatchet Tuesday and he told her she did have it after doing a CT and he gave her Cipro/Flagyl again with a nausea pill.  She continued to vomit and wasn't able to keep it down.   Assessment/Plan:   Acute Diverticulitis -clearly failed outpatient treatment with p.o. Cipro and Flagyl, still has considerable left lower quadrant tenderness, continue clear liquid diet, continue gentle IV fluids along with IV antibiotics.  CT scan was done on 10/13/2018, reviewed, continue to monitor with conservative management.  Currently afebrile with resolved leukocytosis.   DM type II.  A1c 6.8 in December 2019.  Currently on sliding scale continue and monitor.  CBG (last 3)  Recent Labs    10/16/18 2213 10/17/18 0700 10/17/18 0755  GLUCAP 133* 132* 141*    Hypokalemia -replaced will continue to monitor.   Hypomagnesemia.  Replace and recheck in the morning.  Hypertension - Blood pressure on lower end of normal so we will hold p.o. Norvasc  Anemia -likely iron deficiency as per iron studies, ferritin likely falsely elevated due to acute infection, placed on oral iron supplementation once colitis has improved.    Family Communication/Anticipated D/C date and plan/Code Status   DVT prophylaxis: Lovenox ordered. Code Status: Full Code.  Family Communication: At  bedside Disposition Plan: Suspect several more days in the hospital getting IV fluids and IV antibiotics   Medical Consultants:    None.     Subjective:   Continues to have pain with movement and nausea as well  Objective:    Vitals:   10/16/18 1500 10/16/18 1552 10/16/18 2121 10/17/18 0556  BP: 118/62 120/76 132/67 114/64  Pulse: 85 87 91 93  Resp: 18  20 16   Temp: 98.9 F (37.2 C) 98.2 F (36.8 C) (!) 101.6 F (38.7 C) 99.9 F (37.7 C)  TempSrc: Oral Oral Oral Oral  SpO2: 96% 95% 92% 94%  Weight:      Height:        Intake/Output Summary (Last 24 hours) at 10/17/2018 1152 Last data filed at 10/16/2018 2158 Gross per 24 hour  Intake 792.36 ml  Output 1 ml  Net 791.36 ml   Filed Weights   10/15/18 0524 10/15/18 0941  Weight: 77.1 kg 76.2 kg   Exam  Awake Alert, Oriented X 3, No new F.N deficits, Normal affect [place].AT,PERRAL Supple Neck,No JVD, No cervical lymphadenopathy appriciated.  Symmetrical Chest wall movement, Good air movement bilaterally, CTAB RRR,No Gallops, Rubs or new Murmurs, No Parasternal Heave +ve B.Sounds, Abd Soft, ++LLQ tenderness, No organomegaly appriciated, No rebound - guarding or rigidity. No Cyanosis, Clubbing or edema, No new Rash or bruise  Data Reviewed:   I have personally reviewed following labs and imaging studies:  Labs: Labs show the following:   Basic Metabolic Panel: Recent Labs  Lab  10/15/18 0545 10/16/18 0725 10/17/18 0511  NA 137 139 142  K 3.2* 3.0* 3.2*  CL 104 108 109  CO2 21* 22 24  GLUCOSE 178* 120* 144*  BUN 16 12 7*  CREATININE 1.58* 1.33* 1.13*  CALCIUM 8.8* 7.9* 7.9*  MG  --  1.4*  --    GFR Estimated Creatinine Clearance: 44 mL/min (A) (by C-G formula based on SCr of 1.13 mg/dL (H)). Liver Function Tests: Recent Labs  Lab 10/15/18 0545  AST 20  ALT 18  ALKPHOS 116  BILITOT 1.3*  PROT 6.5  ALBUMIN 2.8*   Recent Labs  Lab 10/15/18 0545  LIPASE 31   No results for input(s):  AMMONIA in the last 168 hours. Coagulation profile No results for input(s): INR, PROTIME in the last 168 hours.  CBC: Recent Labs  Lab 10/15/18 0545 10/16/18 0725 10/17/18 0511  WBC 13.0* 8.1 8.0  HGB 11.7* 9.5* 10.1*  HCT 34.9* 28.1* 30.5*  MCV 87.0 86.7 86.2  PLT 165 146* 165   Cardiac Enzymes: No results for input(s): CKTOTAL, CKMB, CKMBINDEX, TROPONINI in the last 168 hours. BNP (last 3 results) No results for input(s): PROBNP in the last 8760 hours. CBG: Recent Labs  Lab 10/16/18 1830 10/16/18 2120 10/16/18 2213 10/17/18 0700 10/17/18 0755  GLUCAP 124* 130* 133* 132* 141*   D-Dimer: No results for input(s): DDIMER in the last 72 hours. Hgb A1c: No results for input(s): HGBA1C in the last 72 hours. Lipid Profile: No results for input(s): CHOL, HDL, LDLCALC, TRIG, CHOLHDL, LDLDIRECT in the last 72 hours. Thyroid function studies: No results for input(s): TSH, T4TOTAL, T3FREE, THYROIDAB in the last 72 hours.  Invalid input(s): FREET3 Anemia work up: Recent Labs    10/17/18 0511  FERRITIN 125  TIBC 217*  IRON 21*   Sepsis Labs: Recent Labs  Lab 10/15/18 0545 10/16/18 0725 10/17/18 0511  WBC 13.0* 8.1 8.0    Microbiology No results found for this or any previous visit (from the past 240 hour(s)).  Procedures and diagnostic studies:  No results found.  Medications:   . aspirin EC  81 mg Oral Daily  . enoxaparin (LOVENOX) injection  40 mg Subcutaneous Q24H  . insulin aspart  0-15 Units Subcutaneous TID WC  . insulin aspart  0-5 Units Subcutaneous QHS  . rosuvastatin  40 mg Oral QHS  . venlafaxine XR  75 mg Oral q morning - 10a   Continuous Infusions: . 0.9 % NaCl with KCl 40 mEq / L 50 mL/hr (10/17/18 1012)  . ciprofloxacin 400 mg (10/17/18 1013)  . metronidazole 500 mg (10/17/18 0647)     LOS: 1 day   Signature  Lala Lund M.D on 10/17/2018 at 11:53 AM   -  To page go to [email or web address]

## 2018-10-18 LAB — CBC
HEMATOCRIT: 28.8 % — AB (ref 36.0–46.0)
Hemoglobin: 9.5 g/dL — ABNORMAL LOW (ref 12.0–15.0)
MCH: 28.9 pg (ref 26.0–34.0)
MCHC: 33 g/dL (ref 30.0–36.0)
MCV: 87.5 fL (ref 80.0–100.0)
PLATELETS: 161 10*3/uL (ref 150–400)
RBC: 3.29 MIL/uL — ABNORMAL LOW (ref 3.87–5.11)
RDW: 14.5 % (ref 11.5–15.5)
WBC: 6.6 10*3/uL (ref 4.0–10.5)
nRBC: 0 % (ref 0.0–0.2)

## 2018-10-18 LAB — BASIC METABOLIC PANEL
Anion gap: 8 (ref 5–15)
BUN: <5 mg/dL — ABNORMAL LOW (ref 8–23)
CO2: 23 mmol/L (ref 22–32)
CREATININE: 1.08 mg/dL — AB (ref 0.44–1.00)
Calcium: 7.8 mg/dL — ABNORMAL LOW (ref 8.9–10.3)
Chloride: 112 mmol/L — ABNORMAL HIGH (ref 98–111)
GFR calc Af Amer: 59 mL/min — ABNORMAL LOW (ref >60)
GFR calc non Af Amer: 51 mL/min — ABNORMAL LOW (ref >60)
Glucose, Bld: 136 mg/dL — ABNORMAL HIGH (ref 70–99)
Potassium: 3.2 mmol/L — ABNORMAL LOW (ref 3.5–5.1)
Sodium: 143 mmol/L (ref 135–145)

## 2018-10-18 LAB — GLUCOSE, CAPILLARY
Glucose-Capillary: 121 mg/dL — ABNORMAL HIGH (ref 70–99)
Glucose-Capillary: 128 mg/dL — ABNORMAL HIGH (ref 70–99)
Glucose-Capillary: 157 mg/dL — ABNORMAL HIGH (ref 70–99)
Glucose-Capillary: 116 mg/dL — ABNORMAL HIGH (ref 70–99)

## 2018-10-18 LAB — MAGNESIUM: Magnesium: 1.6 mg/dL — ABNORMAL LOW (ref 1.7–2.4)

## 2018-10-18 MED ORDER — MAGNESIUM SULFATE 2 GM/50ML IV SOLN
2.0000 g | Freq: Once | INTRAVENOUS | Status: AC
  Administered 2018-10-18: 2 g via INTRAVENOUS
  Filled 2018-10-18: qty 50

## 2018-10-18 MED ORDER — POTASSIUM CHLORIDE CRYS ER 20 MEQ PO TBCR
40.0000 meq | EXTENDED_RELEASE_TABLET | Freq: Once | ORAL | Status: AC
  Administered 2018-10-18: 40 meq via ORAL
  Filled 2018-10-18: qty 2

## 2018-10-18 MED ORDER — POTASSIUM CHLORIDE IN NACL 40-0.9 MEQ/L-% IV SOLN
INTRAVENOUS | Status: DC
  Administered 2018-10-18 – 2018-10-19 (×2): 50 mL/h via INTRAVENOUS
  Filled 2018-10-18 (×2): qty 1000

## 2018-10-18 MED ORDER — SODIUM CHLORIDE 0.9 % IV SOLN
1.0000 g | INTRAVENOUS | Status: DC
  Administered 2018-10-18 – 2018-10-19 (×2): 1 g via INTRAVENOUS
  Filled 2018-10-18 (×2): qty 10

## 2018-10-18 NOTE — Progress Notes (Signed)
Progress Note    Tina Briggs  YVO:[id number] DOB: 13-Nov-1945  DOA: 10/15/2018 PCP: Tina Low, MD    Brief Narrative:     Medical records reviewed and are as summarized below:  Tina Briggs is an 73 y.o. female with medical history significant of HTN; HLD; glomerulonephritis; diverticulitis; and DM presenting with emesis. About 2 1/2 weeks ago, she went to the walk-in clinic and was told she did not have diverticulitis and did not get meds.  She went back and saw the NP and she was told she did have it and was given Cipro/Flagyl.  Took 3 days and then started vomiting.  Went back to see Dr. Lorenda Briggs Tuesday and he told her she did have it after doing a CT and he gave her Cipro/Flagyl again with a nausea pill.  She continued to vomit and wasn't able to keep it down.     Subjective:   Patient in bed, appears comfortable, denies any headache, no fever, no chest pain or pressure, no shortness of breath , improved left lower quadrant abdominal pain. No focal weakness.   Assessment/Plan:   Acute Diverticulitis - she has failed outpatient treatment with oral Cipro and Flagyl, is being treated conservatively with bowel rest, IV fluids and IV antibiotics, gradual improvement noted on 10/18/2018.  Will advance diet, continue IV antibiotics and monitor.  Encouraged to increase activity, if continues to improve possible discharge early tomorrow.   DM type II.  A1c 6.8 in December 2019.  Currently on sliding scale continue and monitor.  CBG (last 3)  Recent Labs    10/17/18 1802 10/17/18 2056 10/18/18 0820  GLUCAP 127* 147* 121*    Hypokalemia -replaced will monitor.   Hypomagnesemia.  Replaced, will recheck again tomorrow.  Hypertension - Blood pressure on lower end of normal so we will hold p.o. Norvasc  Anemia -likely iron deficiency as per iron studies, ferritin likely falsely elevated due to acute infection, placed on oral iron supplementation once  colitis has improved.    Family Communication/Anticipated D/C date and plan/Code Status   DVT prophylaxis: Lovenox ordered. Code Status: Full Code.  Family Communication: At bedside Disposition Plan: Suspect several more days in the hospital getting IV fluids and IV antibiotics   Medical Consultants:    None.    Objective:    Vitals:   10/17/18 0556 10/17/18 1451 10/17/18 2057 10/18/18 0428  BP: 114/64 115/64 125/67 123/75  Pulse: 93 76 81 84  Resp: 16 20 18 17   Temp: 99.9 F (37.7 C) 98.1 F (36.7 C) 98.3 F (36.8 C) 98.6 F (37 C)  TempSrc: Oral Oral Oral Oral  SpO2: 94% 97% 92% (!) 89%  Weight:      Height:        Intake/Output Summary (Last 24 hours) at 10/18/2018 1021 Last data filed at 10/18/2018 0357 Gross per 24 hour  Intake 2710.93 ml  Output -  Net 2710.93 ml   Filed Weights   10/15/18 0524 10/15/18 0941  Weight: 77.1 kg 76.2 kg   Exam  Awake Alert, Oriented X 3, No new F.N deficits, Normal affect Tina Briggs,PERRAL Supple Neck,No JVD, No cervical lymphadenopathy appriciated.  Symmetrical Chest wall movement, Good air movement bilaterally, CTAB RRR,No Gallops, Rubs or new Murmurs, No Parasternal Heave +ve B.Sounds, Abd Soft, still has left lower quadrant abdominal tenderness but improved, No organomegaly appriciated, No rebound - guarding or rigidity. No Cyanosis, Clubbing or edema, No new Rash or bruise   Data  Reviewed:   I have personally reviewed following labs and imaging studies:  Labs: Labs show the following:   Basic Metabolic Panel: Recent Labs  Lab 10/15/18 0545 10/16/18 0725 10/17/18 0511 10/18/18 0554  NA 137 139 142 143  K 3.2* 3.0* 3.2* 3.2*  CL 104 108 109 112*  CO2 21* 22 24 23   GLUCOSE 178* 120* 144* 136*  BUN 16 12 7* <5*  CREATININE 1.58* 1.33* 1.13* 1.08*  CALCIUM 8.8* 7.9* 7.9* 7.8*  MG  --  1.4*  --  1.6*   GFR Estimated Creatinine Clearance: 46 mL/min (A) (by C-G formula based on SCr of 1.08 mg/dL  (H)). Liver Function Tests: Recent Labs  Lab 10/15/18 0545  AST 20  ALT 18  ALKPHOS 116  BILITOT 1.3*  PROT 6.5  ALBUMIN 2.8*   Recent Labs  Lab 10/15/18 0545  LIPASE 31   No results for input(s): AMMONIA in the last 168 hours. Coagulation profile No results for input(s): INR, PROTIME in the last 168 hours.  CBC: Recent Labs  Lab 10/15/18 0545 10/16/18 0725 10/17/18 0511 10/18/18 0554  WBC 13.0* 8.1 8.0 6.6  HGB 11.7* 9.5* 10.1* 9.5*  HCT 34.9* 28.1* 30.5* 28.8*  MCV 87.0 86.7 86.2 87.5  PLT 165 146* 165 161   Cardiac Enzymes: No results for input(s): CKTOTAL, CKMB, CKMBINDEX, TROPONINI in the last 168 hours. BNP (last 3 results) No results for input(s): PROBNP in the last 8760 hours. CBG: Recent Labs  Lab 10/17/18 0755 10/17/18 1237 10/17/18 1802 10/17/18 2056 10/18/18 0820  GLUCAP 141* 210* 127* 147* 121*   D-Dimer: No results for input(s): DDIMER in the last 72 hours. Hgb A1c: No results for input(s): HGBA1C in the last 72 hours. Lipid Profile: No results for input(s): CHOL, HDL, LDLCALC, TRIG, CHOLHDL, LDLDIRECT in the last 72 hours. Thyroid function studies: No results for input(s): TSH, T4TOTAL, T3FREE, THYROIDAB in the last 72 hours.  Invalid input(s): FREET3 Anemia work up: Recent Labs    10/17/18 0511  FERRITIN 125  TIBC 217*  IRON 21*   Sepsis Labs: Recent Labs  Lab 10/15/18 0545 10/16/18 0725 10/17/18 0511 10/18/18 0554  WBC 13.0* 8.1 8.0 6.6    Microbiology No results found for this or any previous visit (from the past 240 hour(s)).  Procedures and diagnostic studies:  No results found.  Medications:   . aspirin EC  81 mg Oral Daily  . enoxaparin (LOVENOX) injection  40 mg Subcutaneous Q24H  . insulin aspart  0-15 Units Subcutaneous TID WC  . insulin aspart  0-5 Units Subcutaneous QHS  . potassium chloride  40 mEq Oral Once  . rosuvastatin  40 mg Oral QHS  . venlafaxine XR  75 mg Oral q morning - 10a   Continuous  Infusions: . 0.9 % NaCl with KCl 40 mEq / L    . cefTRIAXone (ROCEPHIN)  IV    . magnesium sulfate 1 - 4 g bolus IVPB    . metronidazole 500 mg (10/18/18 0511)     LOS: 2 days   Signature  Lala Lund M.D on 10/18/2018 at 10:21 AM   -  To page go to [email or web address]

## 2018-10-19 LAB — CBC
HCT: 30.5 % — ABNORMAL LOW (ref 36.0–46.0)
HEMOGLOBIN: 10.2 g/dL — AB (ref 12.0–15.0)
MCH: 28.8 pg (ref 26.0–34.0)
MCHC: 33.4 g/dL (ref 30.0–36.0)
MCV: 86.2 fL (ref 80.0–100.0)
Platelets: 223 10*3/uL (ref 150–400)
RBC: 3.54 MIL/uL — ABNORMAL LOW (ref 3.87–5.11)
RDW: 14.2 % (ref 11.5–15.5)
WBC: 5.9 10*3/uL (ref 4.0–10.5)
nRBC: 0 % (ref 0.0–0.2)

## 2018-10-19 LAB — BASIC METABOLIC PANEL
Anion gap: 9 (ref 5–15)
BUN: <5 mg/dL — ABNORMAL LOW (ref 8–23)
CO2: 21 mmol/L — AB (ref 22–32)
Calcium: 7.8 mg/dL — ABNORMAL LOW (ref 8.9–10.3)
Chloride: 112 mmol/L — ABNORMAL HIGH (ref 98–111)
Creatinine, Ser: 1.05 mg/dL — ABNORMAL HIGH (ref 0.44–1.00)
GFR calc Af Amer: >60 mL/min (ref >60)
GFR calc non Af Amer: 53 mL/min — ABNORMAL LOW (ref >60)
GLUCOSE: 122 mg/dL — AB (ref 70–99)
Potassium: 3.5 mmol/L (ref 3.5–5.1)
Sodium: 142 mmol/L (ref 135–145)

## 2018-10-19 LAB — MAGNESIUM: Magnesium: 1.7 mg/dL (ref 1.7–2.4)

## 2018-10-19 LAB — GLUCOSE, CAPILLARY
Glucose-Capillary: 118 mg/dL — ABNORMAL HIGH (ref 70–99)
Glucose-Capillary: 132 mg/dL — ABNORMAL HIGH (ref 70–99)
Glucose-Capillary: 162 mg/dL — ABNORMAL HIGH (ref 70–99)
Glucose-Capillary: 169 mg/dL — ABNORMAL HIGH (ref 70–99)

## 2018-10-19 NOTE — Consult Note (Signed)
   [facility] Inpatient Consult   10/19/2018  Tina Briggs 10-19-1945 [id number]  Patient screened for unplanned readmission in the last 30 days.   Patient with [employer] plan. Chart reviewed for post hospital needs.  MD notes are: Tina Briggs is an 73 y.o. female with medical history significant ofHTN; HLD; glomerulonephritis; diverticulitis; and DM presenting with emesis.About 2 1/2 weeks ago, she went to the walk-in clinic and was told she did not have diverticulitis and did not get meds. She went back and saw the NP and she was told she did have it and was given Cipro/Flagyl. Took 3 days and then started vomiting. Went back to see Dr. Lorenda Hatchet Tuesday and he told her she did have it after doing a CT and he gave her Cipro/Flagyl again with a nausea pill. She continued to vomit and wasn't able to keep it down.  Will follow for progress and needs. No needs currently assessed.    Please place a [facility] Care Management consult or for questions contact:   Natividad Brood, RN BSN [facility] Liaison  [phone] business mobile phone Toll free office [phone]

## 2018-10-19 NOTE — Progress Notes (Signed)
Progress Note    Tina Briggs  AJG:[id number] DOB: 1946-05-28  DOA: 10/15/2018 PCP: Tina Low, MD    Brief Narrative:     Medical records reviewed and are as summarized below:  Tina Briggs is an 73 y.o. female with medical history significant of HTN; HLD; glomerulonephritis; diverticulitis; and DM presenting with emesis. About 2 1/2 weeks ago, she went to the walk-in clinic and was told she did not have diverticulitis and did not get meds.  She went back and saw the NP and she was told she did have it and was given Cipro/Flagyl.  Took 3 days and then started vomiting.  Went back to see Dr. Lorenda Briggs Tuesday and he told her she did have it after doing a CT and he gave her Cipro/Flagyl again with a nausea pill.  She continued to vomit and wasn't able to keep it down.     Subjective:   Patient in bed, appears comfortable, denies any headache, no fever, no chest pain or pressure, no shortness of breath , improved left lower quadrant abdominal pain. No focal weakness.   Assessment/Plan:   Acute Diverticulitis - she has failed outpatient treatment with oral Cipro and Flagyl, is being treated conservatively with bowel rest, IV fluids and IV antibiotics, gradual improvement noted on 10/18/2018.  Continue IV antibiotics for another 24 hours, she is still tender in the left lower quadrant but overall improved, advance diet, if continues to improve and no further setbacks likely discharge early morning 10/20/2018.   DM type II.  A1c 6.8 in December 2019.  Currently on sliding scale continue and monitor.  CBG (last 3)  Recent Labs    10/18/18 1734 10/18/18 2237 10/19/18 0857  GLUCAP 157* 116* 118*    Hypokalemia and hypomagnesemia- both replaced and stable.  Hypertension - stable no medications needed.  Anemia - likely iron deficiency as per iron studies, ferritin likely falsely elevated due to acute infection, placed on oral iron supplementation once colitis  has improved.    Family Communication/Anticipated D/C date and plan/Code Status   DVT prophylaxis: Lovenox ordered. Code Status: Full Code.  Family Communication: At bedside Disposition Plan: Suspect several more days in the hospital getting IV fluids and IV antibiotics   Medical Consultants:    None.    Objective:    Vitals:   10/18/18 0428 10/18/18 1512 10/18/18 2256 10/19/18 0557  BP: 123/75 122/70 124/74 125/70  Pulse: 84 84 84 88  Resp: 17 15 16 16   Temp: 98.6 F (37 C) 99 F (37.2 C) 98 F (36.7 C) 99.3 F (37.4 C)  TempSrc: Oral Oral Oral Oral  SpO2: (!) 89% 95% 98% 93%  Weight:      Height:        Intake/Output Summary (Last 24 hours) at 10/19/2018 1107 Last data filed at 10/19/2018 0900 Gross per 24 hour  Intake 1429.99 ml  Output -  Net 1429.99 ml   Filed Weights   10/15/18 0524 10/15/18 0941  Weight: 77.1 kg 76.2 kg   Exam  Awake Alert, Oriented X 3, No new F.N deficits, Normal affect [place].AT,PERRAL Supple Neck,No JVD, No cervical lymphadenopathy appriciated.  Symmetrical Chest wall movement, Good air movement bilaterally, CTAB RRR,No Gallops, Rubs or new Murmurs, No Parasternal Heave +ve B.Sounds, Abd Soft, mild LLQ tenderness, No organomegaly appriciated, No rebound - guarding or rigidity. No Cyanosis, Clubbing or edema, No new Rash or bruise    Data Reviewed:   I have personally  reviewed following labs and imaging studies:  Labs: Labs show the following:   Basic Metabolic Panel: Recent Labs  Lab 10/15/18 0545 10/16/18 0725 10/17/18 0511 10/18/18 0554 10/19/18 0520  NA 137 139 142 143 142  K 3.2* 3.0* 3.2* 3.2* 3.5  CL 104 108 109 112* 112*  CO2 21* 22 24 23  21*  GLUCOSE 178* 120* 144* 136* 122*  BUN 16 12 7* <5* <5*  CREATININE 1.58* 1.33* 1.13* 1.08* 1.05*  CALCIUM 8.8* 7.9* 7.9* 7.8* 7.8*  MG  --  1.4*  --  1.6* 1.7   GFR Estimated Creatinine Clearance: 47.3 mL/min (A) (by C-G formula based on SCr of 1.05 mg/dL  (H)). Liver Function Tests: Recent Labs  Lab 10/15/18 0545  AST 20  ALT 18  ALKPHOS 116  BILITOT 1.3*  PROT 6.5  ALBUMIN 2.8*   Recent Labs  Lab 10/15/18 0545  LIPASE 31   No results for input(s): AMMONIA in the last 168 hours. Coagulation profile No results for input(s): INR, PROTIME in the last 168 hours.  CBC: Recent Labs  Lab 10/15/18 0545 10/16/18 0725 10/17/18 0511 10/18/18 0554 10/19/18 0520  WBC 13.0* 8.1 8.0 6.6 5.9  HGB 11.7* 9.5* 10.1* 9.5* 10.2*  HCT 34.9* 28.1* 30.5* 28.8* 30.5*  MCV 87.0 86.7 86.2 87.5 86.2  PLT 165 146* 165 161 223   Cardiac Enzymes: No results for input(s): CKTOTAL, CKMB, CKMBINDEX, TROPONINI in the last 168 hours. BNP (last 3 results) No results for input(s): PROBNP in the last 8760 hours. CBG: Recent Labs  Lab 10/18/18 0820 10/18/18 1209 10/18/18 1734 10/18/18 2237 10/19/18 0857  GLUCAP 121* 128* 157* 116* 118*   D-Dimer: No results for input(s): DDIMER in the last 72 hours. Hgb A1c: No results for input(s): HGBA1C in the last 72 hours. Lipid Profile: No results for input(s): CHOL, HDL, LDLCALC, TRIG, CHOLHDL, LDLDIRECT in the last 72 hours. Thyroid function studies: No results for input(s): TSH, T4TOTAL, T3FREE, THYROIDAB in the last 72 hours.  Invalid input(s): FREET3 Anemia work up: Recent Labs    10/17/18 0511  FERRITIN 125  TIBC 217*  IRON 21*   Sepsis Labs: Recent Labs  Lab 10/16/18 0725 10/17/18 0511 10/18/18 0554 10/19/18 0520  WBC 8.1 8.0 6.6 5.9    Microbiology No results found for this or any previous visit (from the past 240 hour(s)).  Procedures and diagnostic studies:  No results found.  Medications:   . aspirin EC  81 mg Oral Daily  . enoxaparin (LOVENOX) injection  40 mg Subcutaneous Q24H  . insulin aspart  0-15 Units Subcutaneous TID WC  . insulin aspart  0-5 Units Subcutaneous QHS  . rosuvastatin  40 mg Oral QHS  . venlafaxine XR  75 mg Oral q morning - 10a   Continuous  Infusions: . 0.9 % NaCl with KCl 40 mEq / L 50 mL/hr at 10/19/18 0331  . cefTRIAXone (ROCEPHIN)  IV 1 g (10/19/18 1057)  . metronidazole 500 mg (10/19/18 0530)     LOS: 3 days   Signature  Lala Lund M.D on 10/19/2018 at 11:07 AM   -  To page go to [email or web address]

## 2018-10-20 LAB — GLUCOSE, CAPILLARY: Glucose-Capillary: 122 mg/dL — ABNORMAL HIGH (ref 70–99)

## 2018-10-20 MED ORDER — METRONIDAZOLE 500 MG PO TABS: 500.0000 mg | ORAL_TABLET | Freq: Three times a day (TID) | ORAL | 0 refills | Status: AC

## 2018-10-20 MED ORDER — CEPHALEXIN 500 MG PO CAPS: 500.0000 mg | ORAL_CAPSULE | Freq: Three times a day (TID) | ORAL | 0 refills | Status: AC

## 2018-10-20 MED ORDER — ONDANSETRON HCL 4 MG PO TABS: 4.0000 mg | ORAL_TABLET | Freq: Three times a day (TID) | ORAL | 0 refills | Status: DC | PRN

## 2018-10-20 NOTE — Discharge Instructions (Signed)
Follow with Primary MD Wenda Low, MD in 7 days   Get CBC, BMP  checked  by Primary MD or SNF MD in 5-7 days    Activity: As tolerated with Full fall precautions use walker/cane & assistance as needed  Disposition Home    Diet: Heart Healthy - Soft   Special Instructions: If you have smoked or chewed Tobacco  in the last 2 yrs please stop smoking, stop any regular Alcohol  and or any Recreational drug use.  On your next visit with your primary care physician please Get Medicines reviewed and adjusted.  Please request your Prim.MD to go over all Hospital Tests and Procedure/Radiological results at the follow up, please get all Hospital records sent to your Prim MD by signing hospital release before you go home.  If you experience worsening of your admission symptoms, develop shortness of breath, life threatening emergency, suicidal or homicidal thoughts you must seek medical attention immediately by calling 911 or calling your MD immediately  if symptoms less severe.  You Must read complete instructions/literature along with all the possible adverse reactions/side effects for all the Medicines you take and that have been prescribed to you. Take any new Medicines after you have completely understood and accpet all the possible adverse reactions/side effects.

## 2018-10-20 NOTE — Progress Notes (Signed)
Patient given discharge instructions. IVs removed without complications. Patient given printed prescriptions. Patient dressed. Volunteer rolled patient to main entrance in wheelchair. Husband driving patient home.

## 2018-10-20 NOTE — Discharge Summary (Signed)
Tina Briggs JQB:[id number] DOB: [birth year] DOA: 10/15/2018  PCP: Wenda Low, MD  Admit date: 10/15/2018  Discharge date: 10/20/2018  Admitted From: Home   Disposition:  Home   Recommendations for Outpatient Follow-up:   Follow up with PCP in 1-2 weeks  PCP Please obtain BMP/CBC, 2 view CXR in 1week,  (see Discharge instructions)   PCP Please follow up on the following pending results:    Home Health: None   Equipment/Devices: None  Consultations: None Discharge Condition: Stable   CODE STATUS: Full   Diet Recommendation: Heart Healthy Soft    Chief Complaint  Patient presents with  . Emesis     Brief history of present illness from the day of admission and additional interim summary    Tina Briggs is an 73 y.o. female with medical history significant ofHTN; HLD; glomerulonephritis; diverticulitis; and DM presenting with emesis.About 2 1/2 weeks ago, she went to the walk-in clinic and was told she did not have diverticulitis and did not get meds. She went back and saw the NP and she was told she did have it and was given Cipro/Flagyl. Took 3 days and then started vomiting. Went back to see Dr. Lorenda Hatchet Tuesday and he told her she did have it after doing a CT and he gave her Cipro/Flagyl again with a nausea pill. She continued to vomit and wasn't able to keep it down.                                                                  Hospital Course   Acute Diverticulitis - she has failed outpatient treatment with oral Cipro and Flagyl, was treated here conservatively with IV Rocephin and Flagyl with good improvement, her tenderness is almost completely resolved pain is better, abdominal exam is benign, she will be switched to 10 more days of oral antibiotics which will be Keflex and Flagyl  along with soft diet.  She will be discharged home with outpatient PCP and GI follow-up.   DM type II.  A1c 6.8 in December 2019.    New home regimen and follow with PCP.  Hypokalemia and hypomagnesemia- both replaced and stable.  Hypertension - stable no medications needed.  Anemia - likely iron deficiency as per iron studies, ferritin likely falsely elevated due to acute infection, BCP currently place on oral iron supplementation once colitis has improved.  Outpatient GI evaluation.    Discharge diagnosis     Principal Problem:   Diverticulitis Active Problems:   Essential hypertension   Hypercholesterolemia   Diabetes mellitus type 2 in obese (HCC)   Anemia   Chronic kidney disease, stage 3 [facility])    Discharge instructions    Discharge Instructions    Discharge instructions   Complete by:  As directed  Follow with Primary MD Wenda Low, MD in 7 days   Get CBC, BMP  checked  by Primary MD or SNF MD in 5-7 days    Activity: As tolerated with Full fall precautions use walker/cane & assistance as needed  Disposition Home    Diet: Heart Healthy - Soft   Special Instructions: If you have smoked or chewed Tobacco  in the last 2 yrs please stop smoking, stop any regular Alcohol  and or any Recreational drug use.  On your next visit with your primary care physician please Get Medicines reviewed and adjusted.  Please request your Prim.MD to go over all Hospital Tests and Procedure/Radiological results at the follow up, please get all Hospital records sent to your Prim MD by signing hospital release before you go home.  If you experience worsening of your admission symptoms, develop shortness of breath, life threatening emergency, suicidal or homicidal thoughts you must seek medical attention immediately by calling 911 or calling your MD immediately  if symptoms less severe.  You Must read complete instructions/literature along with all the possible adverse  reactions/side effects for all the Medicines you take and that have been prescribed to you. Take any new Medicines after you have completely understood and accpet all the possible adverse reactions/side effects.   Increase activity slowly   Complete by:  As directed       Discharge Medications   Allergies as of 10/20/2018      Reactions   Codeine Shortness Of Breath   Tetracyclines & Related Itching   Azithromycin Itching, Rash   Erythromycin Itching, Rash   Hydrocodone    Doesn't like to take   Morphine And Related Itching, Rash   Penicillins Itching, Rash   Has patient had a PCN reaction causing immediate rash, facial/tongue/throat swelling, SOB or lightheadedness with hypotension: Yes Has patient had a PCN reaction causing severe rash involving mucus membranes or skin necrosis: No Has patient had a PCN reaction that required hospitalization: No Has patient had a PCN reaction occurring within the last 10 years: No If all of the above answers are "NO", then may proceed with Cephalosporin use.   Septra [sulfamethoxazole-trimethoprim] Itching, Rash      Medication List    TAKE these medications   acetaminophen 500 MG tablet Commonly known as:  TYLENOL Take 1,000 mg by mouth 2 (two) times daily as needed for moderate pain or headache.   amLODipine 10 MG tablet Commonly known as:  NORVASC Take 10 mg by mouth daily.   aspirin EC 81 MG tablet Take 81 mg by mouth daily.   cephALEXin 500 MG capsule Commonly known as:  KEFLEX Take 1 capsule (500 mg total) by mouth 3 (three) times daily for 10 days.   cholecalciferol 1000 units tablet Commonly known as:  VITAMIN D Take 1,000 Units by mouth daily.   CRESTOR 40 MG tablet Generic drug:  rosuvastatin TAKE 1 TABLET AT BEDTIME What changed:  how much to take   fluticasone 50 MCG/ACT nasal spray Commonly known as:  FLONASE Place 2 sprays into both nostrils daily as needed for allergies.   insulin aspart 100 UNIT/ML  injection Commonly known as:  novoLOG Inject 18-34 Units into the skin 3 (three) times daily before meals. Sliding scale   metroNIDAZOLE 500 MG tablet Commonly known as:  FLAGYL Take 1 tablet (500 mg total) by mouth 3 (three) times daily for 10 days.   ondansetron 4 MG tablet Commonly known as:  ZOFRAN Take 1  tablet (4 mg total) by mouth every 8 (eight) hours as needed for nausea or vomiting.   pantoprazole 40 MG tablet Commonly known as:  PROTONIX Take 40 mg by mouth as needed (acid reflux).   venlafaxine XR 75 MG 24 hr capsule Commonly known as:  EFFEXOR-XR Take 75 mg by mouth every morning.   vitamin B-12 1000 MCG tablet Commonly known as:  CYANOCOBALAMIN Take 1,000 mcg by mouth 3 (three) times a week. Take on Sundays, Wednesdays and Fridays.       Follow-up Information    Wenda Low, MD. Schedule an appointment as soon as possible for a visit in 1 week(s).   Specialty:  Internal Medicine Contact information: [address] [place] [address] [phone]        Gatha Mayer, MD. Schedule an appointment as soon as possible for a visit in 1 week(s).   Specialty:  Gastroenterology Contact information: [address] [place] [address] [phone]           Major procedures and Radiology Reports - PLEASE review detailed and final reports thoroughly  -        Ct Abdomen Pelvis Wo Contrast  Result Date: 10/13/2018 CLINICAL DATA:  Acute onset of LEFT LOWER QUADRANT abdominal pain. L4-5 fusion on 09/16/2018. EXAM: CT ABDOMEN AND PELVIS WITHOUT CONTRAST TECHNIQUE: Multidetector CT imaging of the abdomen and pelvis was performed following the standard protocol without IV contrast. COMPARISON:  10/28/2017 and earlier. FINDINGS: Lower chest: Chronic elevation of the LEFT hemidiaphragm. Visualized lung bases clear. Aortic annular and mitral annular calcification. Stable normal heart size. Hepatobiliary: Normal unenhanced appearance of the  liver. Gas within normal caliber intrahepatic and extrahepatic bile ducts as noted previously. Surgically absent gallbladder. Pancreas: Normal unenhanced appearance. Spleen: Normal unenhanced appearance. Focus of accessory splenic tissue MEDIAL to the LOWER pole. Adrenals/Urinary Tract: Normal appearing adrenal glands. No evidence of urinary tract calculi. Within the limits of the unenhanced technique, no focal parenchymal abnormality involving either kidney. No evidence of hydronephrosis involving either kidney. No urinary tract calculi. Normal appearing decompressed urinary bladder. Stomach/Bowel: Large hiatal hernia, unchanged dating back to 2016. Stomach otherwise normal in appearance. Diverticulum arising from the MEDIAL descending duodenum with inspissated material. Adjacent diverticula arising from the transverse duodenum. Small bowel otherwise normal in appearance. Mobile cecum positioned in the RIGHT UPPER QUADRANT. Lipoma involving the ileocecal valve. Liquid stool throughout the colon. Distal descending and sigmoid colon diverticulosis with evidence of acute diverticulitis involving the distal descending/proximal sigmoid colon. No evidence of extraluminal gas or abnormal fluid collection. Appendix surgically absent. Vascular/Lymphatic: Mild aortic atherosclerosis without evidence of aneurysm. No pathologic lymphadenopathy. Reproductive: Uterus surgically absent. No adnexal masses. Other: Likely injection sites in the subcutaneous fat of both sides of the anterior abdominal wall. Musculoskeletal: L4-5 PLIF and interbody fusion with cages, without complicating features as there is no evidence of a paraspinous fluid collection. Degenerative disc disease at L3-4. Facet degenerative changes at L5-S1. No acute findings. IMPRESSION: 1. Acute diverticulitis involving the distal descending/proximal sigmoid colon. No evidence of perforation or abscess. 2. No acute abnormalities otherwise involving the abdomen or  pelvis. 3. Large hiatal hernia, unchanged dating back to 2016. 4. Aortic Atherosclerosis, mild. (ICD10-I70.0). Electronically Signed   By: Evangeline Dakin M.D.   On: 10/13/2018 16:28    Micro Results    No results found for this or any previous visit (from the past 240 hour(s)).  Today   Subjective    Tina Briggs  today has no headache,no chest abdominal pain,no new weakness tingling or numbness, feels much better wants to go home today.    Objective   Blood pressure 123/87, pulse 81, temperature 98.7 F (37.1 C), temperature source Oral, resp. rate 18, height 5\' 3"  (1.6 m), weight 76.2 kg, SpO2 95 %.   Intake/Output Summary (Last 24 hours) at 10/20/2018 0908 Last data filed at 10/20/2018 0000 Gross per 24 hour  Intake 1396.44 ml  Output -  Net 1396.44 ml    Exam Awake Alert, Oriented x 3, No new F.N deficits, Normal affect [place].AT,PERRAL Supple Neck,No JVD, No cervical lymphadenopathy appriciated.  Symmetrical Chest wall movement, Good air movement bilaterally, CTAB RRR,No Gallops,Rubs or new Murmurs, No Parasternal Heave +ve B.Sounds, Abd Soft, Non tender, No organomegaly appriciated, No rebound -guarding or rigidity. No Cyanosis, Clubbing or edema, No new Rash or bruise   Data Review   CBC w Diff:  Lab Results  Component Value Date   WBC 5.9 10/19/2018   HGB 10.2 (L) 10/19/2018   HGB 11.7 08/26/2017   HCT 30.5 (L) 10/19/2018   HCT 34.7 (L) 08/26/2017   PLT 223 10/19/2018   PLT 180 08/26/2017   LYMPHOPCT 6 09/09/2018   LYMPHOPCT 23.6 08/26/2017   MONOPCT 5 09/09/2018   MONOPCT 4.6 08/26/2017   EOSPCT 0 09/09/2018   EOSPCT 2.0 08/26/2017   BASOPCT 0 09/09/2018   BASOPCT 0.4 08/26/2017    CMP:  Lab Results  Component Value Date   NA 142 10/19/2018   K 3.5 10/19/2018   CL 112 (H) 10/19/2018   CO2 21 (L) 10/19/2018   BUN <5 (L) 10/19/2018   CREATININE 1.05 (H) 10/19/2018   PROT 6.5 10/15/2018   PROT 6.6 08/26/2017   ALBUMIN 2.8 (L) 10/15/2018    BILITOT 1.3 (H) 10/15/2018   ALKPHOS 116 10/15/2018   AST 20 10/15/2018   ALT 18 10/15/2018  .   Total Time in preparing paper work, data evaluation and todays exam - 70 minutes  Lala Lund M.D on 10/20/2018 at 9:08 AM  [facility]   Office  [phone]

## 2018-10-21 ENCOUNTER — Other Ambulatory Visit: Payer: Self-pay

## 2018-10-21 NOTE — Consult Note (Signed)
Patient assigned to [facility] general call for follow up. Re-admission less than 30 days.  Natividad Brood, RN BSN [facility] Liaison  [phone] business mobile phone Toll free office [phone]

## 2018-10-26 DIAGNOSIS — M5126 Other intervertebral disc displacement, lumbar region: Secondary | ICD-10-CM | POA: Diagnosis not present

## 2018-10-27 ENCOUNTER — Other Ambulatory Visit: Payer: Self-pay

## 2018-10-27 NOTE — Patient Outreach (Signed)
[facility]) Care Management  10/27/2018  DOMINO HOLTEN Oct 05, 1945 [id number]   EMMI- General Discharge RED ON EMMI ALERT Day # 4 Date: 10/26/2018 Red Alert Reason:  Lost interest in things? Yes  Sad/hopeless/anxious/empty? Yes    Outreach attempt: no answer.  HIPAA compliant voice message left.    Plan: RN CM will attempt again within 4 business days and send letter.  Jone Baseman, RN, MSN [facility] Care Management Care Management Coordinator Direct Line [phone] Toll Free: [phone]  Fax: [phone]

## 2018-10-28 ENCOUNTER — Other Ambulatory Visit: Payer: Self-pay

## 2018-10-28 DIAGNOSIS — E119 Type 2 diabetes mellitus without complications: Secondary | ICD-10-CM | POA: Diagnosis not present

## 2018-10-28 DIAGNOSIS — Z79899 Other long term (current) drug therapy: Secondary | ICD-10-CM | POA: Diagnosis not present

## 2018-10-28 DIAGNOSIS — Z794 Long term (current) use of insulin: Secondary | ICD-10-CM | POA: Diagnosis not present

## 2018-10-28 NOTE — Patient Outreach (Signed)
[facility]) Care Management  10/28/2018  Tina Briggs 26-Apr-1946 [id number]   EMMI-General Discharge RED ON EMMI ALERT Day #4 Date:10/26/2018 Red Alert Reason: Lost interest in things? Yes  Sad/hopeless/anxious/empty? Yes   Incoming call from patient.  She is able to verify HIPAA.  Discussed with patient red alerts.  She reports feeling down due to most recent bout with diverticulitis and the length of illness and misdiagnoses in the beginning.  Patient denies any suicidal thoughts. Patient states she wants to do things but she is weak and has no energy. Discussed with patient diverticulitis, affects and allowing time for recovery.  She verbalized understanding.  Patient states she has a follow up with physician on tomorrow and denies any problems with transportation.  Patient declined any further needs at this time.    Plan: RN CM will close case.   Jone Baseman, RN, MSN [place] Management Care Management Coordinator Direct Line [phone] Cell [phone] Toll Free: [phone]  Fax: [phone]

## 2018-10-28 NOTE — Patient Outreach (Signed)
[facility]) Care Management  10/28/2018  WINDIE MARASCO Dec 11, 1945 [id number]   EMMI- General Discharge RED ON EMMI ALERT Day # 4 Date:10/26/2018 Red Alert Reason: Lost interest in things? Yes  Sad/hopeless/anxious/empty? Yes    Outreach attempt: no answer.  HIPAA compliant voice message left.    Plan: RN CM will attempt again within 4 business days.  Jone Baseman, RN, MSN [place] Management Care Management Coordinator Direct Line [phone] Cell [phone] Toll Free: [phone]  Fax: [phone]

## 2018-10-29 ENCOUNTER — Ambulatory Visit: Payer: Self-pay

## 2018-10-29 DIAGNOSIS — E876 Hypokalemia: Secondary | ICD-10-CM | POA: Diagnosis not present

## 2018-10-29 DIAGNOSIS — Z1389 Encounter for screening for other disorder: Secondary | ICD-10-CM | POA: Diagnosis not present

## 2018-10-29 DIAGNOSIS — F324 Major depressive disorder, single episode, in partial remission: Secondary | ICD-10-CM | POA: Diagnosis not present

## 2018-10-29 DIAGNOSIS — K5792 Diverticulitis of intestine, part unspecified, without perforation or abscess without bleeding: Secondary | ICD-10-CM | POA: Diagnosis not present

## 2018-11-10 ENCOUNTER — Other Ambulatory Visit: Payer: Self-pay | Admitting: Internal Medicine

## 2018-11-10 DIAGNOSIS — M79605 Pain in left leg: Secondary | ICD-10-CM | POA: Diagnosis not present

## 2018-11-10 DIAGNOSIS — R251 Tremor, unspecified: Secondary | ICD-10-CM | POA: Diagnosis not present

## 2018-11-10 DIAGNOSIS — M7989 Other specified soft tissue disorders: Secondary | ICD-10-CM

## 2018-11-10 DIAGNOSIS — R609 Edema, unspecified: Secondary | ICD-10-CM | POA: Diagnosis not present

## 2018-11-11 ENCOUNTER — Ambulatory Visit
Admission: RE | Admit: 2018-11-11 | Discharge: 2018-11-11 | Disposition: A | Discharge status: Home / Self Care | Payer: PPO | Source: Ambulatory Visit | Attending: Internal Medicine | Admitting: Internal Medicine

## 2018-11-11 DIAGNOSIS — M79662 Pain in left lower leg: Secondary | ICD-10-CM | POA: Diagnosis not present

## 2018-11-11 DIAGNOSIS — M7989 Other specified soft tissue disorders: Secondary | ICD-10-CM | POA: Diagnosis not present

## 2018-11-11 DIAGNOSIS — M79605 Pain in left leg: Secondary | ICD-10-CM

## 2018-11-12 ENCOUNTER — Encounter: Payer: Self-pay | Admitting: Neurology

## 2018-11-19 NOTE — Progress Notes (Addendum)
Tina Briggs was seen today in the movement disorders clinic for neurologic consultation at the request of Wenda Low, MD.  The consultation is for the evaluation of tremor of the face and hands.      Specific Symptoms:  Tremor: Yes.     How long has it been going on? 2.5 years, but worse over the last year  At rest or with activation?  activation  When is it noted the most?  writing  Fam hx of tremor?  Yes.  , father had possible PD  Located where?  started in bilateral UE, L > R (she is R handed)  Affected by caffeine:  No. (doesn't drink enough to know)  Affected by alcohol:  Doesn't drink EtOH  Affected by stress:  Yes.    Affected by fatigue:  No.  Spills soup if on spoon:  No.  Spills glass of liquid if full:  potentially  Affects ADL's (tying shoes, brushing teeth, etc):  No., except putting on mascara    Voice: "raspy" Sleep: sleeps well  Vivid Dreams:  No.  Acting out dreams:  No. Wet Pillows: Yes.  , a little Postural symptoms:  Some, but attributes to a problem with her left leg, where it swells and feels hot.  Had 3 LE U/S and were normal.  They put her on gabapentin for that and they increased it and noted it helped tremor.  Falls?  Yes.  , fell in December in the bathroom (4 days prior to back surgery) Bradykinesia symptoms: pushes off of the chair due to back pain (better since surgery but still "sore") Loss of smell:  No. Loss of taste:  No. Urinary Incontinence:  No. Difficulty Swallowing:  No. Handwriting, micrographia: some but not bad Depression:  Yes.   Memory changes:  Yes.  , attributes to recent anesthesia/pain meds/back in the hospital after sx for diverticulitis N/V:  No. Lightheaded:  No.  Syncope: No. Diplopia:  No. Dyskinesia:  No. Prior exposure to reglan/antipsychotics: No.  Neuroimaging of the brain has not previously been performed.   PREVIOUS MEDICATIONS: none to date  ALLERGIES:   Allergies  Allergen Reactions  .  Codeine Shortness Of Breath  . Tetracyclines & Related Itching  . Azithromycin Itching and Rash  . Erythromycin Itching and Rash  . Morphine And Related Itching and Rash  . Penicillins Itching and Rash    Has patient had a PCN reaction causing immediate rash, facial/tongue/throat swelling, SOB or lightheadedness with hypotension: Yes Has patient had a PCN reaction causing severe rash involving mucus membranes or skin necrosis: No Has patient had a PCN reaction that required hospitalization: No Has patient had a PCN reaction occurring within the last 10 years: No If all of the above answers are "NO", then may proceed with Cephalosporin use.   [name] [Sulfamethoxazole-Trimethoprim] Itching and Rash    CURRENT MEDICATIONS:  Outpatient Encounter Medications as of 11/23/2018  Medication Sig  . acetaminophen (TYLENOL) 500 MG tablet Take 1,000 mg by mouth 2 (two) times daily as needed for moderate pain or headache.   Marland Kitchen amLODipine (NORVASC) 10 MG tablet Take 10 mg by mouth daily.  Marland Kitchen aspirin EC 81 MG tablet Take 81 mg by mouth daily.  . cholecalciferol (VITAMIN D) 1000 units tablet Take 1,000 Units by mouth daily.  . CRESTOR 40 MG tablet TAKE 1 TABLET AT BEDTIME (Patient taking differently: Take 40 mg by mouth at bedtime. )  . fluticasone (FLONASE) 50 MCG/ACT nasal  spray Place 2 sprays into both nostrils daily as needed for allergies.   Marland Kitchen gabapentin (NEURONTIN) 300 MG capsule Take 300 mg by mouth 4 (four) times daily.  . insulin aspart (NOVOLOG) 100 UNIT/ML injection Inject 18-34 Units into the skin 3 (three) times daily before meals. Sliding scale  . venlafaxine XR (EFFEXOR-XR) 75 MG 24 hr capsule Take 75 mg by mouth every morning.   . vitamin B-12 (CYANOCOBALAMIN) 1000 MCG tablet Take 1,000 mcg by mouth 3 (three) times a week. Take on Sundays, Wednesdays and Fridays.  . [DISCONTINUED] ondansetron (ZOFRAN) 4 MG tablet Take 1 tablet (4 mg total) by mouth every 8 (eight) hours as needed for nausea  or vomiting.  . [DISCONTINUED] pantoprazole (PROTONIX) 40 MG tablet Take 40 mg by mouth as needed (acid reflux).    No facility-administered encounter medications on file as of 11/23/2018.     PAST MEDICAL HISTORY:   Past Medical History:  Diagnosis Date  . Anemia   . Aspiration pneumonia ([place])    after common bile duct obstruction  . CATARACT, RIGHT EYE    had repaired  . Depression   . Diverticulitis   . DM   . GERD   . Glomerulonephritis 2019  . HNP (herniated nucleus pulposus), lumbar    Recurrent  . HYPERLIPIDEMIA-MIXED   . HYPERTENSION, UNSPECIFIED   . OBESITY   . PONV (postoperative nausea and vomiting)    past history only, "sometimes they put a patch on me"    PAST SURGICAL HISTORY:   Past Surgical History:  Procedure Laterality Date  . ABDOMINAL HYSTERECTOMY    . APPENDECTOMY    . BACK SURGERY     lumbar '09"microdiscectomy"  . CHOLECYSTECTOMY     open  . COLONOSCOPY WITH PROPOFOL N/A 11/22/2014   Procedure: COLONOSCOPY WITH PROPOFOL;  Surgeon: Garlan Fair, MD;  Location: [facility];  Service: Endoscopy;  Laterality: N/A;  . ERCP N/A 05/26/2015   Procedure: ENDOSCOPIC RETROGRADE CHOLANGIOPANCREATOGRAPHY (ERCP)   (DOING CASE IN MAIN OR);  Surgeon: Teena Irani, MD;  Location: [facility];  Service: Gastroenterology;  Laterality: N/A;  . ERCP N/A 06/06/2015   Procedure: ENDOSCOPIC RETROGRADE CHOLANGIOPANCREATOGRAPHY (ERCP);  Surgeon: Clarene Essex, MD;  Location: [facility];  Service: Endoscopy;  Laterality: N/A;  . ESOPHAGOGASTRODUODENOSCOPY (EGD) WITH PROPOFOL N/A 11/22/2014   Procedure: ESOPHAGOGASTRODUODENOSCOPY (EGD) WITH PROPOFOL;  Surgeon: Garlan Fair, MD;  [facility]: [facility];  Service: Endoscopy;  Laterality: N/A;  . EYE SURGERY Bilateral    cataracts  . LUMBAR FUSION  09/16/2018  . LUMBAR LAMINECTOMY/DECOMPRESSION MICRODISCECTOMY Left 12/31/2017   Procedure: Laminectomy and Foraminotomy - Lumbar Three-Four - left Extraforaminal diskectomy Lumbar  Four-Five left;  Surgeon: Eustace Moore, MD;  Location: [place];  Service: Neurosurgery;  Laterality: Left;  . PERONEAL NERVE DECOMPRESSION Left 12/31/2017   Procedure: Left PERONEAL NERVE DECOMPRESSION;  Surgeon: Eustace Moore, MD;  Location: [place];  Service: Neurosurgery;  Laterality: Left;  left  . TUBAL LIGATION      SOCIAL HISTORY:   Social History   Socioeconomic History  . Marital status: Married    Spouse name: Not on file  . Number of children: Not on file  . Years of education: Not on file  . Highest education level: Not on file  Occupational History  . Occupation: retired  Scientific laboratory technician  . Financial resource strain: Not on file  . Food insecurity:    Worry: Not on file    Inability: Not on file  .  Transportation needs:    Medical: Not on file    Non-medical: Not on file  Tobacco Use  . Smoking status: Never Smoker  . Smokeless tobacco: Never Used  Substance and Sexual Activity  . Alcohol use: No  . Drug use: No  . Sexual activity: Not on file  Lifestyle  . Physical activity:    Days per week: Not on file    Minutes per session: Not on file  . Stress: Not on file  Relationships  . Social connections:    Talks on phone: Not on file    Gets together: Not on file    Attends religious service: Not on file    Active member of club or organization: Not on file    Attends meetings of clubs or organizations: Not on file    Relationship status: Not on file  . Intimate partner violence:    Fear of current or ex partner: Not on file    Emotionally abused: Not on file    Physically abused: Not on file    Forced sexual activity: Not on file  Other Topics Concern  . Not on file  Social History Narrative  . Not on file    FAMILY HISTORY:   Family Status  Relation Name Status  . Mat Aunt  (Not Specified)  . MGM  (Not Specified)  . PGM  (Not Specified)  . Mother  Deceased  . Father  Deceased  . Brother  Alive  . Son  Alive  . Son  Alive    ROS:  Review of  Systems  Constitutional: Negative.   HENT: Negative.   Eyes: Negative.   Respiratory: Negative.   Cardiovascular: Negative.   Gastrointestinal: Negative.   Musculoskeletal: Positive for back pain.  Skin: Negative.   Neurological: Positive for tremors.  Endo/Heme/Allergies: Negative.   Psychiatric/Behavioral: Positive for depression.    PHYSICAL EXAMINATION:    VITALS:   Vitals:   11/23/18 0911  BP: 138/62  Pulse: 76  SpO2: 97%  Weight: 173 lb (78.5 kg)  Height: 5\' 3"  (1.6 m)    GEN:  The patient appears stated age and is in NAD. HEENT:  Normocephalic, atraumatic.  The mucous membranes are moist. The superficial temporal arteries are without ropiness or tenderness. CV:  RRR Lungs:  CTAB Neck/HEME:  There are no carotid bruits bilaterally.  Neurological examination:  Orientation: The patient is alert and oriented x3. Fund of knowledge is appropriate.  Recent and remote memory are intact.  Attention and concentration are normal.    Able to name objects and repeat phrases. Cranial nerves: There is good facial symmetry. Pupils are equal round and reactive to light bilaterally. Fundoscopic exam reveals clear margins bilaterally. Extraocular muscles are intact. The visual fields are full to confrontational testing. The speech is fluent and clear. Soft palate rises symmetrically and there is no tongue deviation. Hearing is intact to conversational tone. Sensation: Sensation is intact to light throughout. Vibration is intact at the bilateral big toe. There is no extinction with double simultaneous stimulation. There is no sensory dermatomal level identified. Motor: Strength is 5/5 in the bilateral upper and lower extremities (some mild give way weakness in the LLE but that improves with encouragement).   Shoulder shrug is equal and symmetric.  There is no pronator drift. Deep tendon reflexes: Deep tendon reflexes are 2/4 at the bilateral biceps, triceps, brachioradialis, right patella,  absent at the right patella (just had L4 surgery) and achilles. Plantar responses are  downgoing bilaterally.  Movement examination: Tone: There is normal tone in the bilateral upper extremities.  The tone in the lower extremities is normal.  Abnormal movements: there is bilateral UE tremor that can be felt at rest and tremor is felt in the LE as well.  There is jaw tremor that is overall mild.  There is postural tremor, L more than R.  There is intention tremor.  She has very little trouble with archimedes spirals.  she has no difficulty when asked to pour a full glass of water from one glass to another but tremor is evident. Coordination:  There is  decremation with RAM's, only on the L foot with taps (but states that she had drop foot there until December back surgery and while she is better, the foot is still slow) Gait and Station: The patient has no difficulty arising out of a deep-seated chair without the use of the hands (with the exception of back pain). The patient is tenuous when she ambulates.  She has good arm swing.  Lab Results  Component Value Date   WBC 5.9 10/19/2018   HGB 10.2 (L) 10/19/2018   HCT 30.5 (L) 10/19/2018   MCV 86.2 10/19/2018   PLT 223 10/19/2018     Chemistry      Component Value Date/Time   NA 142 10/19/2018 0520   K 3.5 10/19/2018 0520   CL 112 (H) 10/19/2018 0520   CO2 21 (L) 10/19/2018 0520   BUN <5 (L) 10/19/2018 0520   CREATININE 1.05 (H) 10/19/2018 0520      Component Value Date/Time   CALCIUM 7.8 (L) 10/19/2018 0520   ALKPHOS 116 10/15/2018 0545   AST 20 10/15/2018 0545   ALT 18 10/15/2018 0545   BILITOT 1.3 (H) 10/15/2018 0545     No results found for: TSH   ASSESSMENT/PLAN:  1.  Essential Tremor.  -This is evidenced by the symmetrical nature and longstanding hx of gradually getting worse.  We discussed nature and pathophysiology.  We discussed that this can continue to gradually get worse with time.  We discussed that some medications  can worsen this, as can caffeine use.  We discussed medication therapy as well as surgical therapy.  Patient is on gabapentin and thinks that has helped.  I discussed that this is generally considered second line for tremor, but certainly can help.  She states that her neurosurgeon has recommended that this be increased.  She is currently on 1200 mg/day.  I told her that I am not sure that going up will help the tremor, but she would like to see how it responds when she goes up on that for back pain.  I did tell her that medications are traditionally not helpful for her jaw tremor, which has been bothersome for her.  I did tell her that only surgery generally helps this.  -pt reports that had TSH already done.  Will try to call PCP and get that (addendum: TSH was dated 04/10/2018 and was 1.89).  2.  F/u prn.  Much greater than 50% of this visit was spent in counseling and coordinating care.  Total face to face time:  45 min  Cc:  Wenda Low, MD

## 2018-11-23 ENCOUNTER — Encounter: Payer: Self-pay | Admitting: Neurology

## 2018-11-23 ENCOUNTER — Ambulatory Visit (INDEPENDENT_AMBULATORY_CARE_PROVIDER_SITE_OTHER): Payer: PPO | Admitting: Neurology

## 2018-11-23 VITALS — BP 138/62 | HR 76 | Ht 63.0 in | Wt 173.0 lb

## 2018-11-23 DIAGNOSIS — G250 Essential tremor: Secondary | ICD-10-CM | POA: Diagnosis not present

## 2018-11-23 NOTE — Patient Instructions (Signed)
You have a diagnosis of essential tremor.  We discussed using primidone, but decided to hold on that for now.  It was good to see you today!  The physicians and staff at [facility] are committed to providing excellent care. You may receive a survey requesting feedback about your experience at our office. We strive to receive "very good" responses to the survey questions. If you feel that your experience would prevent you from giving the office a "very good " response, please contact our office to try to remedy the situation. We may be reached at [phone]. Thank you for taking the time out of your busy day to complete the survey.

## 2018-11-24 DIAGNOSIS — G629 Polyneuropathy, unspecified: Secondary | ICD-10-CM | POA: Diagnosis not present

## 2018-11-24 DIAGNOSIS — E1122 Type 2 diabetes mellitus with diabetic chronic kidney disease: Secondary | ICD-10-CM | POA: Diagnosis not present

## 2018-11-24 DIAGNOSIS — F322 Major depressive disorder, single episode, severe without psychotic features: Secondary | ICD-10-CM | POA: Diagnosis not present

## 2018-11-24 DIAGNOSIS — K219 Gastro-esophageal reflux disease without esophagitis: Secondary | ICD-10-CM | POA: Diagnosis not present

## 2018-11-24 DIAGNOSIS — G250 Essential tremor: Secondary | ICD-10-CM | POA: Diagnosis not present

## 2018-11-24 DIAGNOSIS — I10 Essential (primary) hypertension: Secondary | ICD-10-CM | POA: Diagnosis not present

## 2018-11-24 DIAGNOSIS — R600 Localized edema: Secondary | ICD-10-CM | POA: Diagnosis not present

## 2018-11-24 DIAGNOSIS — E782 Mixed hyperlipidemia: Secondary | ICD-10-CM | POA: Diagnosis not present

## 2018-11-24 DIAGNOSIS — Z794 Long term (current) use of insulin: Secondary | ICD-10-CM | POA: Diagnosis not present

## 2018-12-07 DIAGNOSIS — H353131 Nonexudative age-related macular degeneration, bilateral, early dry stage: Secondary | ICD-10-CM | POA: Diagnosis not present

## 2018-12-07 DIAGNOSIS — H43813 Vitreous degeneration, bilateral: Secondary | ICD-10-CM | POA: Diagnosis not present

## 2018-12-07 DIAGNOSIS — E119 Type 2 diabetes mellitus without complications: Secondary | ICD-10-CM | POA: Diagnosis not present

## 2018-12-24 DIAGNOSIS — M5416 Radiculopathy, lumbar region: Secondary | ICD-10-CM | POA: Diagnosis not present

## 2019-01-19 ENCOUNTER — Encounter: Payer: Self-pay | Admitting: Nephrology

## 2019-01-19 DIAGNOSIS — N052 Unspecified nephritic syndrome with diffuse membranous glomerulonephritis: Secondary | ICD-10-CM | POA: Diagnosis not present

## 2019-01-19 DIAGNOSIS — M5126 Other intervertebral disc displacement, lumbar region: Secondary | ICD-10-CM | POA: Diagnosis not present

## 2019-01-19 DIAGNOSIS — I129 Hypertensive chronic kidney disease with stage 1 through stage 4 chronic kidney disease, or unspecified chronic kidney disease: Secondary | ICD-10-CM | POA: Diagnosis not present

## 2019-01-19 DIAGNOSIS — N189 Chronic kidney disease, unspecified: Secondary | ICD-10-CM | POA: Diagnosis not present

## 2019-01-19 DIAGNOSIS — E1129 Type 2 diabetes mellitus with other diabetic kidney complication: Secondary | ICD-10-CM | POA: Diagnosis not present

## 2019-01-19 DIAGNOSIS — D631 Anemia in chronic kidney disease: Secondary | ICD-10-CM | POA: Diagnosis not present

## 2019-01-19 DIAGNOSIS — K5792 Diverticulitis of intestine, part unspecified, without perforation or abscess without bleeding: Secondary | ICD-10-CM | POA: Diagnosis not present

## 2019-02-04 DIAGNOSIS — M545 Low back pain: Secondary | ICD-10-CM | POA: Diagnosis not present

## 2019-02-04 DIAGNOSIS — M5416 Radiculopathy, lumbar region: Secondary | ICD-10-CM | POA: Diagnosis not present

## 2019-02-09 DIAGNOSIS — M461 Sacroiliitis, not elsewhere classified: Secondary | ICD-10-CM | POA: Insufficient documentation

## 2019-02-09 DIAGNOSIS — M545 Low back pain: Secondary | ICD-10-CM | POA: Diagnosis not present

## 2019-02-09 DIAGNOSIS — M5416 Radiculopathy, lumbar region: Secondary | ICD-10-CM | POA: Diagnosis not present

## 2019-02-09 DIAGNOSIS — M5126 Other intervertebral disc displacement, lumbar region: Secondary | ICD-10-CM | POA: Diagnosis not present

## 2019-02-17 DIAGNOSIS — M461 Sacroiliitis, not elsewhere classified: Secondary | ICD-10-CM | POA: Diagnosis not present

## 2019-02-23 DIAGNOSIS — Z5181 Encounter for therapeutic drug level monitoring: Secondary | ICD-10-CM | POA: Diagnosis not present

## 2019-02-23 DIAGNOSIS — E119 Type 2 diabetes mellitus without complications: Secondary | ICD-10-CM | POA: Diagnosis not present

## 2019-02-23 DIAGNOSIS — Z794 Long term (current) use of insulin: Secondary | ICD-10-CM | POA: Diagnosis not present

## 2019-04-08 DIAGNOSIS — M545 Low back pain: Secondary | ICD-10-CM | POA: Diagnosis not present

## 2019-04-08 DIAGNOSIS — I10 Essential (primary) hypertension: Secondary | ICD-10-CM | POA: Diagnosis not present

## 2019-04-15 ENCOUNTER — Other Ambulatory Visit: Payer: Self-pay | Admitting: Neurological Surgery

## 2019-04-15 DIAGNOSIS — M545 Low back pain, unspecified: Secondary | ICD-10-CM

## 2019-04-15 DIAGNOSIS — L821 Other seborrheic keratosis: Secondary | ICD-10-CM | POA: Diagnosis not present

## 2019-04-15 DIAGNOSIS — D2262 Melanocytic nevi of left upper limb, including shoulder: Secondary | ICD-10-CM | POA: Diagnosis not present

## 2019-04-15 DIAGNOSIS — L814 Other melanin hyperpigmentation: Secondary | ICD-10-CM | POA: Diagnosis not present

## 2019-04-15 DIAGNOSIS — D2272 Melanocytic nevi of left lower limb, including hip: Secondary | ICD-10-CM | POA: Diagnosis not present

## 2019-04-15 DIAGNOSIS — Z85828 Personal history of other malignant neoplasm of skin: Secondary | ICD-10-CM | POA: Diagnosis not present

## 2019-04-15 DIAGNOSIS — D1801 Hemangioma of skin and subcutaneous tissue: Secondary | ICD-10-CM | POA: Diagnosis not present

## 2019-04-15 DIAGNOSIS — D2261 Melanocytic nevi of right upper limb, including shoulder: Secondary | ICD-10-CM | POA: Diagnosis not present

## 2019-04-15 DIAGNOSIS — D225 Melanocytic nevi of trunk: Secondary | ICD-10-CM | POA: Diagnosis not present

## 2019-04-15 DIAGNOSIS — D2271 Melanocytic nevi of right lower limb, including hip: Secondary | ICD-10-CM | POA: Diagnosis not present

## 2019-04-15 DIAGNOSIS — D692 Other nonthrombocytopenic purpura: Secondary | ICD-10-CM | POA: Diagnosis not present

## 2019-04-20 ENCOUNTER — Other Ambulatory Visit: Payer: Self-pay

## 2019-04-20 ENCOUNTER — Ambulatory Visit
Admission: RE | Admit: 2019-04-20 | Discharge: 2019-04-20 | Disposition: A | Discharge status: Home / Self Care | Payer: PPO | Source: Ambulatory Visit | Attending: Neurological Surgery | Admitting: Neurological Surgery

## 2019-04-20 DIAGNOSIS — M545 Low back pain, unspecified: Secondary | ICD-10-CM

## 2019-04-22 DIAGNOSIS — M545 Low back pain: Secondary | ICD-10-CM | POA: Diagnosis not present

## 2019-05-10 DIAGNOSIS — Z5181 Encounter for therapeutic drug level monitoring: Secondary | ICD-10-CM | POA: Diagnosis not present

## 2019-05-10 DIAGNOSIS — Z794 Long term (current) use of insulin: Secondary | ICD-10-CM | POA: Diagnosis not present

## 2019-05-10 DIAGNOSIS — E119 Type 2 diabetes mellitus without complications: Secondary | ICD-10-CM | POA: Diagnosis not present

## 2019-05-12 DIAGNOSIS — M5416 Radiculopathy, lumbar region: Secondary | ICD-10-CM | POA: Diagnosis not present

## 2019-05-12 DIAGNOSIS — Z6831 Body mass index (BMI) 31.0-31.9, adult: Secondary | ICD-10-CM | POA: Diagnosis not present

## 2019-05-12 DIAGNOSIS — I10 Essential (primary) hypertension: Secondary | ICD-10-CM | POA: Diagnosis not present

## 2019-05-26 DIAGNOSIS — N052 Unspecified nephritic syndrome with diffuse membranous glomerulonephritis: Secondary | ICD-10-CM | POA: Diagnosis not present

## 2019-05-26 DIAGNOSIS — N189 Chronic kidney disease, unspecified: Secondary | ICD-10-CM | POA: Diagnosis not present

## 2019-05-26 DIAGNOSIS — K5792 Diverticulitis of intestine, part unspecified, without perforation or abscess without bleeding: Secondary | ICD-10-CM | POA: Diagnosis not present

## 2019-05-26 DIAGNOSIS — I129 Hypertensive chronic kidney disease with stage 1 through stage 4 chronic kidney disease, or unspecified chronic kidney disease: Secondary | ICD-10-CM | POA: Diagnosis not present

## 2019-05-26 DIAGNOSIS — M5126 Other intervertebral disc displacement, lumbar region: Secondary | ICD-10-CM | POA: Diagnosis not present

## 2019-05-26 DIAGNOSIS — D631 Anemia in chronic kidney disease: Secondary | ICD-10-CM | POA: Diagnosis not present

## 2019-05-26 DIAGNOSIS — E1129 Type 2 diabetes mellitus with other diabetic kidney complication: Secondary | ICD-10-CM | POA: Diagnosis not present

## 2019-06-01 DIAGNOSIS — M545 Low back pain, unspecified: Secondary | ICD-10-CM | POA: Insufficient documentation

## 2019-06-02 DIAGNOSIS — D509 Iron deficiency anemia, unspecified: Secondary | ICD-10-CM | POA: Diagnosis not present

## 2019-06-02 DIAGNOSIS — N183 Chronic kidney disease, stage 3 (moderate): Secondary | ICD-10-CM | POA: Diagnosis not present

## 2019-06-02 DIAGNOSIS — M519 Unspecified thoracic, thoracolumbar and lumbosacral intervertebral disc disorder: Secondary | ICD-10-CM | POA: Diagnosis not present

## 2019-06-02 DIAGNOSIS — N052 Unspecified nephritic syndrome with diffuse membranous glomerulonephritis: Secondary | ICD-10-CM | POA: Diagnosis not present

## 2019-06-02 DIAGNOSIS — F322 Major depressive disorder, single episode, severe without psychotic features: Secondary | ICD-10-CM | POA: Diagnosis not present

## 2019-06-02 DIAGNOSIS — E782 Mixed hyperlipidemia: Secondary | ICD-10-CM | POA: Diagnosis not present

## 2019-06-02 DIAGNOSIS — G629 Polyneuropathy, unspecified: Secondary | ICD-10-CM | POA: Diagnosis not present

## 2019-06-02 DIAGNOSIS — Z23 Encounter for immunization: Secondary | ICD-10-CM | POA: Diagnosis not present

## 2019-06-02 DIAGNOSIS — I10 Essential (primary) hypertension: Secondary | ICD-10-CM | POA: Diagnosis not present

## 2019-06-02 DIAGNOSIS — Z0000 Encounter for general adult medical examination without abnormal findings: Secondary | ICD-10-CM | POA: Diagnosis not present

## 2019-06-02 DIAGNOSIS — Z1389 Encounter for screening for other disorder: Secondary | ICD-10-CM | POA: Diagnosis not present

## 2019-06-02 DIAGNOSIS — E559 Vitamin D deficiency, unspecified: Secondary | ICD-10-CM | POA: Diagnosis not present

## 2019-06-02 DIAGNOSIS — E1122 Type 2 diabetes mellitus with diabetic chronic kidney disease: Secondary | ICD-10-CM | POA: Diagnosis not present

## 2019-06-09 DIAGNOSIS — M6281 Muscle weakness (generalized): Secondary | ICD-10-CM | POA: Diagnosis not present

## 2019-06-09 DIAGNOSIS — M79604 Pain in right leg: Secondary | ICD-10-CM | POA: Diagnosis not present

## 2019-06-09 DIAGNOSIS — R262 Difficulty in walking, not elsewhere classified: Secondary | ICD-10-CM | POA: Diagnosis not present

## 2019-06-09 DIAGNOSIS — M545 Low back pain: Secondary | ICD-10-CM | POA: Diagnosis not present

## 2019-06-15 DIAGNOSIS — M79604 Pain in right leg: Secondary | ICD-10-CM | POA: Diagnosis not present

## 2019-06-15 DIAGNOSIS — M545 Low back pain: Secondary | ICD-10-CM | POA: Diagnosis not present

## 2019-06-15 DIAGNOSIS — R262 Difficulty in walking, not elsewhere classified: Secondary | ICD-10-CM | POA: Diagnosis not present

## 2019-06-15 DIAGNOSIS — M6281 Muscle weakness (generalized): Secondary | ICD-10-CM | POA: Diagnosis not present

## 2019-06-17 DIAGNOSIS — R262 Difficulty in walking, not elsewhere classified: Secondary | ICD-10-CM | POA: Diagnosis not present

## 2019-06-17 DIAGNOSIS — M545 Low back pain: Secondary | ICD-10-CM | POA: Diagnosis not present

## 2019-06-17 DIAGNOSIS — M79604 Pain in right leg: Secondary | ICD-10-CM | POA: Diagnosis not present

## 2019-06-17 DIAGNOSIS — M6281 Muscle weakness (generalized): Secondary | ICD-10-CM | POA: Diagnosis not present

## 2019-06-25 DIAGNOSIS — R262 Difficulty in walking, not elsewhere classified: Secondary | ICD-10-CM | POA: Diagnosis not present

## 2019-06-25 DIAGNOSIS — M545 Low back pain: Secondary | ICD-10-CM | POA: Diagnosis not present

## 2019-06-25 DIAGNOSIS — M6281 Muscle weakness (generalized): Secondary | ICD-10-CM | POA: Diagnosis not present

## 2019-06-25 DIAGNOSIS — M79604 Pain in right leg: Secondary | ICD-10-CM | POA: Diagnosis not present

## 2019-06-28 DIAGNOSIS — M545 Low back pain: Secondary | ICD-10-CM | POA: Diagnosis not present

## 2019-06-28 DIAGNOSIS — M79604 Pain in right leg: Secondary | ICD-10-CM | POA: Diagnosis not present

## 2019-06-28 DIAGNOSIS — R262 Difficulty in walking, not elsewhere classified: Secondary | ICD-10-CM | POA: Diagnosis not present

## 2019-06-28 DIAGNOSIS — M6281 Muscle weakness (generalized): Secondary | ICD-10-CM | POA: Diagnosis not present

## 2019-07-01 DIAGNOSIS — M79604 Pain in right leg: Secondary | ICD-10-CM | POA: Diagnosis not present

## 2019-07-01 DIAGNOSIS — M545 Low back pain: Secondary | ICD-10-CM | POA: Diagnosis not present

## 2019-07-01 DIAGNOSIS — R262 Difficulty in walking, not elsewhere classified: Secondary | ICD-10-CM | POA: Diagnosis not present

## 2019-07-01 DIAGNOSIS — M6281 Muscle weakness (generalized): Secondary | ICD-10-CM | POA: Diagnosis not present

## 2019-07-06 DIAGNOSIS — R262 Difficulty in walking, not elsewhere classified: Secondary | ICD-10-CM | POA: Diagnosis not present

## 2019-07-06 DIAGNOSIS — M79604 Pain in right leg: Secondary | ICD-10-CM | POA: Diagnosis not present

## 2019-07-06 DIAGNOSIS — M6281 Muscle weakness (generalized): Secondary | ICD-10-CM | POA: Diagnosis not present

## 2019-07-06 DIAGNOSIS — M545 Low back pain: Secondary | ICD-10-CM | POA: Diagnosis not present

## 2019-07-13 DIAGNOSIS — M545 Low back pain: Secondary | ICD-10-CM | POA: Diagnosis not present

## 2019-07-13 DIAGNOSIS — M79604 Pain in right leg: Secondary | ICD-10-CM | POA: Diagnosis not present

## 2019-07-13 DIAGNOSIS — M6281 Muscle weakness (generalized): Secondary | ICD-10-CM | POA: Diagnosis not present

## 2019-07-13 DIAGNOSIS — R262 Difficulty in walking, not elsewhere classified: Secondary | ICD-10-CM | POA: Diagnosis not present

## 2019-07-26 DIAGNOSIS — R262 Difficulty in walking, not elsewhere classified: Secondary | ICD-10-CM | POA: Diagnosis not present

## 2019-07-26 DIAGNOSIS — M6281 Muscle weakness (generalized): Secondary | ICD-10-CM | POA: Diagnosis not present

## 2019-07-26 DIAGNOSIS — M79604 Pain in right leg: Secondary | ICD-10-CM | POA: Diagnosis not present

## 2019-07-26 DIAGNOSIS — M545 Low back pain: Secondary | ICD-10-CM | POA: Diagnosis not present

## 2019-08-12 DIAGNOSIS — E119 Type 2 diabetes mellitus without complications: Secondary | ICD-10-CM | POA: Diagnosis not present

## 2019-08-12 DIAGNOSIS — H43813 Vitreous degeneration, bilateral: Secondary | ICD-10-CM | POA: Diagnosis not present

## 2019-08-12 DIAGNOSIS — H524 Presbyopia: Secondary | ICD-10-CM | POA: Diagnosis not present

## 2019-08-12 DIAGNOSIS — H353132 Nonexudative age-related macular degeneration, bilateral, intermediate dry stage: Secondary | ICD-10-CM | POA: Diagnosis not present

## 2019-08-31 DIAGNOSIS — M545 Low back pain: Secondary | ICD-10-CM | POA: Diagnosis not present

## 2019-09-27 DIAGNOSIS — M5126 Other intervertebral disc displacement, lumbar region: Secondary | ICD-10-CM | POA: Diagnosis not present

## 2019-09-27 DIAGNOSIS — M25552 Pain in left hip: Secondary | ICD-10-CM | POA: Diagnosis not present

## 2019-09-27 DIAGNOSIS — N052 Unspecified nephritic syndrome with diffuse membranous glomerulonephritis: Secondary | ICD-10-CM | POA: Diagnosis not present

## 2019-09-27 DIAGNOSIS — I129 Hypertensive chronic kidney disease with stage 1 through stage 4 chronic kidney disease, or unspecified chronic kidney disease: Secondary | ICD-10-CM | POA: Diagnosis not present

## 2019-09-27 DIAGNOSIS — N189 Chronic kidney disease, unspecified: Secondary | ICD-10-CM | POA: Diagnosis not present

## 2019-09-27 DIAGNOSIS — M25551 Pain in right hip: Secondary | ICD-10-CM | POA: Diagnosis not present

## 2019-09-27 DIAGNOSIS — D631 Anemia in chronic kidney disease: Secondary | ICD-10-CM | POA: Diagnosis not present

## 2019-09-27 DIAGNOSIS — E1129 Type 2 diabetes mellitus with other diabetic kidney complication: Secondary | ICD-10-CM | POA: Diagnosis not present

## 2019-09-30 DIAGNOSIS — H26491 Other secondary cataract, right eye: Secondary | ICD-10-CM | POA: Diagnosis not present

## 2019-10-08 DIAGNOSIS — K591 Functional diarrhea: Secondary | ICD-10-CM | POA: Diagnosis not present

## 2019-10-28 DIAGNOSIS — M25551 Pain in right hip: Secondary | ICD-10-CM | POA: Diagnosis not present

## 2019-10-28 DIAGNOSIS — M25552 Pain in left hip: Secondary | ICD-10-CM | POA: Diagnosis not present

## 2019-11-03 DIAGNOSIS — N052 Unspecified nephritic syndrome with diffuse membranous glomerulonephritis: Secondary | ICD-10-CM | POA: Diagnosis not present

## 2019-11-11 DIAGNOSIS — Z79899 Other long term (current) drug therapy: Secondary | ICD-10-CM | POA: Diagnosis not present

## 2019-11-11 DIAGNOSIS — Z794 Long term (current) use of insulin: Secondary | ICD-10-CM | POA: Diagnosis not present

## 2019-11-11 DIAGNOSIS — E119 Type 2 diabetes mellitus without complications: Secondary | ICD-10-CM | POA: Diagnosis not present

## 2019-11-22 DIAGNOSIS — N052 Unspecified nephritic syndrome with diffuse membranous glomerulonephritis: Secondary | ICD-10-CM | POA: Diagnosis not present

## 2019-11-22 DIAGNOSIS — E1129 Type 2 diabetes mellitus with other diabetic kidney complication: Secondary | ICD-10-CM | POA: Diagnosis not present

## 2019-11-22 DIAGNOSIS — N189 Chronic kidney disease, unspecified: Secondary | ICD-10-CM | POA: Diagnosis not present

## 2019-11-22 DIAGNOSIS — D631 Anemia in chronic kidney disease: Secondary | ICD-10-CM | POA: Diagnosis not present

## 2019-11-22 DIAGNOSIS — I129 Hypertensive chronic kidney disease with stage 1 through stage 4 chronic kidney disease, or unspecified chronic kidney disease: Secondary | ICD-10-CM | POA: Diagnosis not present

## 2019-11-22 DIAGNOSIS — E785 Hyperlipidemia, unspecified: Secondary | ICD-10-CM | POA: Diagnosis not present

## 2019-11-28 DIAGNOSIS — M25552 Pain in left hip: Secondary | ICD-10-CM | POA: Diagnosis not present

## 2019-11-28 DIAGNOSIS — M25551 Pain in right hip: Secondary | ICD-10-CM | POA: Diagnosis not present

## 2019-11-29 DIAGNOSIS — Z86010 Personal history of colonic polyps: Secondary | ICD-10-CM | POA: Diagnosis not present

## 2019-11-29 DIAGNOSIS — K591 Functional diarrhea: Secondary | ICD-10-CM | POA: Diagnosis not present

## 2019-12-09 DIAGNOSIS — E119 Type 2 diabetes mellitus without complications: Secondary | ICD-10-CM | POA: Diagnosis not present

## 2019-12-09 DIAGNOSIS — Z794 Long term (current) use of insulin: Secondary | ICD-10-CM | POA: Diagnosis not present

## 2019-12-26 DIAGNOSIS — M25552 Pain in left hip: Secondary | ICD-10-CM | POA: Diagnosis not present

## 2019-12-26 DIAGNOSIS — M25551 Pain in right hip: Secondary | ICD-10-CM | POA: Diagnosis not present

## 2019-12-27 DIAGNOSIS — E1129 Type 2 diabetes mellitus with other diabetic kidney complication: Secondary | ICD-10-CM | POA: Diagnosis not present

## 2019-12-27 DIAGNOSIS — I129 Hypertensive chronic kidney disease with stage 1 through stage 4 chronic kidney disease, or unspecified chronic kidney disease: Secondary | ICD-10-CM | POA: Diagnosis not present

## 2019-12-27 DIAGNOSIS — N052 Unspecified nephritic syndrome with diffuse membranous glomerulonephritis: Secondary | ICD-10-CM | POA: Diagnosis not present

## 2019-12-27 DIAGNOSIS — D631 Anemia in chronic kidney disease: Secondary | ICD-10-CM | POA: Diagnosis not present

## 2019-12-27 DIAGNOSIS — N189 Chronic kidney disease, unspecified: Secondary | ICD-10-CM | POA: Diagnosis not present

## 2019-12-27 DIAGNOSIS — E785 Hyperlipidemia, unspecified: Secondary | ICD-10-CM | POA: Diagnosis not present

## 2020-01-11 ENCOUNTER — Ambulatory Visit
Admission: RE | Admit: 2020-01-11 | Discharge: 2020-01-11 | Disposition: A | Discharge status: Home / Self Care | Payer: PPO | Source: Ambulatory Visit | Attending: Internal Medicine | Admitting: Internal Medicine

## 2020-01-11 ENCOUNTER — Other Ambulatory Visit: Payer: Self-pay | Admitting: Internal Medicine

## 2020-01-11 DIAGNOSIS — M19012 Primary osteoarthritis, left shoulder: Secondary | ICD-10-CM | POA: Diagnosis not present

## 2020-01-11 DIAGNOSIS — M25512 Pain in left shoulder: Secondary | ICD-10-CM

## 2020-01-11 DIAGNOSIS — M79603 Pain in arm, unspecified: Secondary | ICD-10-CM | POA: Diagnosis not present

## 2020-01-11 DIAGNOSIS — M50323 Other cervical disc degeneration at C6-C7 level: Secondary | ICD-10-CM | POA: Diagnosis not present

## 2020-01-11 DIAGNOSIS — M25511 Pain in right shoulder: Secondary | ICD-10-CM

## 2020-01-11 DIAGNOSIS — M542 Cervicalgia: Secondary | ICD-10-CM | POA: Diagnosis not present

## 2020-01-19 ENCOUNTER — Other Ambulatory Visit: Payer: Self-pay

## 2020-01-19 ENCOUNTER — Ambulatory Visit: Payer: PPO | Admitting: Orthopaedic Surgery

## 2020-01-24 ENCOUNTER — Other Ambulatory Visit: Payer: Self-pay

## 2020-01-24 ENCOUNTER — Ambulatory Visit (INDEPENDENT_AMBULATORY_CARE_PROVIDER_SITE_OTHER): Payer: PPO | Admitting: Physician Assistant

## 2020-01-24 ENCOUNTER — Encounter: Payer: Self-pay | Admitting: Physician Assistant

## 2020-01-24 DIAGNOSIS — M5010 Cervical disc disorder with radiculopathy, unspecified cervical region: Secondary | ICD-10-CM

## 2020-01-24 NOTE — Progress Notes (Signed)
Office Visit Note   Patient: Tina Briggs           Date of Birth: 12-23-1945           MRN: [id number] Visit Date: 01/24/2020              Requested by: Wenda Low, MD [address] [place],  [place] [address] PCP: Wenda Low, MD   Assessment & Plan: Visit Diagnoses: No diagnosis found.  Plan: We will order an MRI of her cervical spine to rule out HNP as a source of her radicular symptoms down both arms.  Have her back once the study has been performed to go over the results and discuss further treatment.  Questions were encouraged and answered at length.  Follow-Up Instructions: Return After MRI cervical spine..   Orders:  No orders of the defined types were placed in this encounter.  No orders of the defined types were placed in this encounter.     Procedures: No procedures performed   Clinical Data: No additional findings.   Subjective: Chief Complaint  Patient presents with  . Left Shoulder - Pain  . Right Shoulder - Pain    HPI Tina Briggs comes in today due to bilateral shoulder pain.  She is referred by Dr. Benita Stabile.  Patient states that she is having pain in her right shoulder for the past 3 months and left shoulder the past year.  She states she cannot reach up behind her due to the pain in her arms.  She t took some prednisone and this helped.  However her pain numbness tingling down the arms is slowly returning.  She denies any known injury.  She has numbness down her left arm that goes into the hand at times.  Also right arm achy pain and numbness that goes down into the wrist.  Review of Systems See HPI otherwise negative or noncontributory.  Objective: Vital Signs: There were no vitals taken for this visit.  Physical Exam Constitutional:      Appearance: She is not ill-appearing or diaphoretic.  Pulmonary:     Effort: Pulmonary effort is normal.  Neurological:     Mental Status: She is alert and oriented to  person, place, and time.  Psychiatric:        Mood and Affect: Mood normal.     Ortho Exam Upper extremities 5 out of 5 strength throughout against resistance.  She has full forward flexion both shoulders actively.  Actively she can only abduct both shoulders to 90 degrees passively both arms can be brought over her head.  Negative impingement testing bilateral shoulders.  She has tenderness along the trapezius region both shoulders and also over the cervical spinal column.  Positive Spurling's.  She has pain with rotation of the cervical spine the left and right.  Also slight pain with the flexion of the cervical spine.  Deep tendon reflexes are 2+ and equal and symmetric at the biceps triceps and brachial radialis.  Full motor bilateral hands.  Full sensation bilateral hands. Cervical spine films dated 01/11/2020 are reviewed and show no acute fractures.  No spondylolisthesis.  Diffuse degenerative facet disease.  Disc space narrowing at C6-C7 with an anterior spur. Bilateral shoulders dated 01/11/2020 are reviewed and showed no acute fractures.  Bilateral shoulders well located.  No significant arthropathy changes.   Specialty Comments:  No specialty comments available.  Imaging: No results found.   PMFS History: Patient Active Problem List  Diagnosis Date Noted  . Diverticulitis 10/15/2018  . S/P lumbar fusion 09/16/2018  . S/P lumbar laminectomy 12/31/2017  . Glomerulonephritis 09/30/2017  . Elevated serum immunoglobulin free light chain level 08/26/2017  . Chronic kidney disease, stage 3 ([place]) 06/07/2015  . Choledocholithiasis 06/07/2015  . Aspiration pneumonitis ([place]) 06/06/2015  . Anemia 06/06/2015  . Diabetes mellitus type 2 in obese ([place]) 05/25/2015  . Elevated liver enzymes 05/24/2015  . RUQ abdominal pain 05/24/2015  . Hypercholesterolemia 10/27/2014  . Obesity 02/15/2009  . CATARACT, RIGHT EYE 02/15/2009  . Essential hypertension 02/15/2009  . GERD 02/15/2009   Past  Medical History:  Diagnosis Date  . Anemia   . Aspiration pneumonia ([place])    after common bile duct obstruction  . CATARACT, RIGHT EYE    had repaired  . Depression   . Diverticulitis    recurrent episodes  . DM   . GERD   . 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 Mild to mod TI scarring. (Dr. Lorrene Reid)  . HNP (herniated nucleus pulposus), lumbar    Recurrent  . HYPERLIPIDEMIA-MIXED   . HYPERTENSION, UNSPECIFIED   . OBESITY   . PONV (postoperative nausea and vomiting)    past history only, "sometimes they put a patch on me"    Family History  Problem Relation Age of Onset  . Cancer Maternal Aunt        breast cancer  . Cancer Maternal Grandmother        kidney cancer   . Cancer Paternal Grandmother        bone cancer   . Heart disease Mother   . Alzheimer's disease Father   . Tremor Father        possible PD  . Diabetes Mellitus II Brother   . Healthy Son   . Healthy Son     Past Surgical History:  Procedure Laterality Date  . ABDOMINAL HYSTERECTOMY    . APPENDECTOMY    . BACK SURGERY     lumbar '09"microdiscectomy"  . CHOLECYSTECTOMY     open  . COLONOSCOPY WITH PROPOFOL N/A 11/22/2014   Procedure: COLONOSCOPY WITH PROPOFOL;  Surgeon: Garlan Fair, MD;  [facility];  Service: Endoscopy;  Laterality: N/A;  . ERCP N/A 05/26/2015   Procedure: ENDOSCOPIC RETROGRADE CHOLANGIOPANCREATOGRAPHY (ERCP)   (DOING CASE IN MAIN OR);  Surgeon: Teena Irani, MD;  Location: [facility];  Service: Gastroenterology;  Laterality: N/A;  . ERCP N/A 06/06/2015   Procedure: ENDOSCOPIC RETROGRADE CHOLANGIOPANCREATOGRAPHY (ERCP);  Surgeon: Clarene Essex, MD;  Location: [facility];  Service: Endoscopy;  Laterality: N/A;  . ESOPHAGOGASTRODUODENOSCOPY (EGD) WITH PROPOFOL N/A 11/22/2014   Procedure: ESOPHAGOGASTRODUODENOSCOPY (EGD) WITH PROPOFOL;  Surgeon: Garlan Fair, MD;  [facility];  Service: Endoscopy;   Laterality: N/A;  . EYE SURGERY Bilateral    cataracts  . LUMBAR FUSION  09/16/2018  . LUMBAR LAMINECTOMY/DECOMPRESSION MICRODISCECTOMY Left 12/31/2017   Procedure: Laminectomy and Foraminotomy - Lumbar Three-Four - left Extraforaminal diskectomy Lumbar Four-Five left;  Surgeon: Eustace Moore, MD;  [facility];  Service: Neurosurgery;  Laterality: Left;  . PERONEAL NERVE DECOMPRESSION Left 12/31/2017   Procedure: Left PERONEAL NERVE DECOMPRESSION;  Surgeon: Eustace Moore, MD;  Location: [place];  Service: Neurosurgery;  Laterality: Left;  left  . TUBAL LIGATION     Social History   Occupational History  . Occupation: retired  Tobacco Use  . Smoking status: Never Smoker  . Smokeless tobacco: Never Used  Substance  and Sexual Activity  . Alcohol use: No  . Drug use: No  . Sexual activity: Not on file

## 2020-01-25 ENCOUNTER — Other Ambulatory Visit: Payer: Self-pay

## 2020-01-25 DIAGNOSIS — M4807 Spinal stenosis, lumbosacral region: Secondary | ICD-10-CM

## 2020-01-26 DIAGNOSIS — M25552 Pain in left hip: Secondary | ICD-10-CM | POA: Diagnosis not present

## 2020-01-26 DIAGNOSIS — M25551 Pain in right hip: Secondary | ICD-10-CM | POA: Diagnosis not present

## 2020-01-27 DIAGNOSIS — F322 Major depressive disorder, single episode, severe without psychotic features: Secondary | ICD-10-CM | POA: Diagnosis not present

## 2020-01-27 DIAGNOSIS — N183 Chronic kidney disease, stage 3 unspecified: Secondary | ICD-10-CM | POA: Diagnosis not present

## 2020-01-27 DIAGNOSIS — N052 Unspecified nephritic syndrome with diffuse membranous glomerulonephritis: Secondary | ICD-10-CM | POA: Diagnosis not present

## 2020-01-27 DIAGNOSIS — K591 Functional diarrhea: Secondary | ICD-10-CM | POA: Diagnosis not present

## 2020-01-27 DIAGNOSIS — I10 Essential (primary) hypertension: Secondary | ICD-10-CM | POA: Diagnosis not present

## 2020-01-27 DIAGNOSIS — E1122 Type 2 diabetes mellitus with diabetic chronic kidney disease: Secondary | ICD-10-CM | POA: Diagnosis not present

## 2020-01-27 DIAGNOSIS — E782 Mixed hyperlipidemia: Secondary | ICD-10-CM | POA: Diagnosis not present

## 2020-01-27 DIAGNOSIS — F419 Anxiety disorder, unspecified: Secondary | ICD-10-CM | POA: Diagnosis not present

## 2020-01-27 DIAGNOSIS — R748 Abnormal levels of other serum enzymes: Secondary | ICD-10-CM | POA: Diagnosis not present

## 2020-01-27 DIAGNOSIS — Z794 Long term (current) use of insulin: Secondary | ICD-10-CM | POA: Diagnosis not present

## 2020-02-08 DIAGNOSIS — E119 Type 2 diabetes mellitus without complications: Secondary | ICD-10-CM | POA: Diagnosis not present

## 2020-02-08 DIAGNOSIS — Z794 Long term (current) use of insulin: Secondary | ICD-10-CM | POA: Diagnosis not present

## 2020-02-08 DIAGNOSIS — Z5181 Encounter for therapeutic drug level monitoring: Secondary | ICD-10-CM | POA: Diagnosis not present

## 2020-02-24 ENCOUNTER — Ambulatory Visit
Admission: RE | Admit: 2020-02-24 | Discharge: 2020-02-24 | Disposition: A | Discharge status: Home / Self Care | Payer: PPO | Source: Ambulatory Visit | Attending: Orthopaedic Surgery | Admitting: Orthopaedic Surgery

## 2020-02-24 ENCOUNTER — Other Ambulatory Visit: Payer: Self-pay

## 2020-02-24 DIAGNOSIS — M542 Cervicalgia: Secondary | ICD-10-CM | POA: Diagnosis not present

## 2020-02-24 DIAGNOSIS — M4807 Spinal stenosis, lumbosacral region: Secondary | ICD-10-CM

## 2020-02-25 DIAGNOSIS — M25552 Pain in left hip: Secondary | ICD-10-CM | POA: Diagnosis not present

## 2020-02-25 DIAGNOSIS — M25551 Pain in right hip: Secondary | ICD-10-CM | POA: Diagnosis not present

## 2020-02-29 ENCOUNTER — Encounter: Payer: Self-pay | Admitting: Orthopaedic Surgery

## 2020-02-29 ENCOUNTER — Ambulatory Visit: Payer: PPO | Admitting: Orthopaedic Surgery

## 2020-02-29 ENCOUNTER — Other Ambulatory Visit: Payer: Self-pay

## 2020-02-29 DIAGNOSIS — G8929 Other chronic pain: Secondary | ICD-10-CM | POA: Diagnosis not present

## 2020-02-29 DIAGNOSIS — M25511 Pain in right shoulder: Secondary | ICD-10-CM

## 2020-02-29 DIAGNOSIS — M25512 Pain in left shoulder: Secondary | ICD-10-CM | POA: Diagnosis not present

## 2020-02-29 DIAGNOSIS — M5010 Cervical disc disorder with radiculopathy, unspecified cervical region: Secondary | ICD-10-CM

## 2020-02-29 MED ORDER — LIDOCAINE HCL 1 % IJ SOLN
3.0000 mL | INTRAMUSCULAR | Status: AC | PRN
  Administered 2020-02-29: 3 mL

## 2020-02-29 MED ORDER — METHYLPREDNISOLONE ACETATE 40 MG/ML IJ SUSP
40.0000 mg | INTRAMUSCULAR | Status: AC | PRN
  Administered 2020-02-29: 40 mg via INTRA_ARTICULAR

## 2020-02-29 NOTE — Progress Notes (Signed)
Office Visit Note   Patient: Tina Briggs           Date of Birth: 12/30/1945           MRN: [id number] Visit Date: 02/29/2020              Requested by: Wenda Low, MD [address] [place],  [place] [address] PCP: Wenda Low, MD   Assessment & Plan: Visit Diagnoses:  1. Cervical disc disorder with radiculopathy, unspecified cervical region     Plan: I felt that she would benefit from a steroid injection in her left shoulder subacromial space and she agreed with this and tolerated well.  She understands that she will watch her blood glucose closely.  She is interested in outpatient physical therapy.  She has a relationship with [facility] so we will send her there for therapy on her cervical spine and her bilateral shoulders.  I will see her back in 4 weeks to consider a steroid injection in her right shoulder if needed.  All questions and concerns were answered and addressed.  Follow-Up Instructions: Return in about 4 weeks (around 03/28/2020).   Orders:  Orders Placed This Encounter  Procedures  . Large Joint Inj   No orders of the defined types were placed in this encounter.     Procedures: Large Joint Inj: L subacromial bursa on 02/29/2020 9:50 AM Indications: pain and diagnostic evaluation Details: 22 G 1.5 in needle  Arthrogram: No  Medications: 3 mL lidocaine 1 %; 40 mg methylPREDNISolone acetate 40 MG/ML Outcome: tolerated well, no immediate complications Procedure, treatment alternatives, risks and benefits explained, specific risks discussed. Consent was given by the patient. Immediately prior to procedure a time out was called to verify the correct patient, procedure, equipment, support staff and site/side marked as required. Patient was prepped and draped in the usual sterile fashion.       Clinical Data: No additional findings.   Subjective: Chief Complaint  Patient presents with  . Neck - Follow-up  The  patient comes in today to go over MRI of her cervical spine.  When she was sent does she was complaining of bilateral shoulder pain but more so radicular symptoms going down into her left arm.  She did have some pain on exam of her shoulders but her x-rays did not show any worrisome features and her motion is full with good strength in her rotator cuff.  Due to continued neck pain and the radicular symptoms we felt was worthwhile obtaining an MRI of her neck.  She is complaining of bilateral shoulder pain with left worse than right.  It hurts with overhead activities and reaching behind her with the shoulders.  She is diabetic but reports pretty good control.  She does state that steroid injections can increase her blood glucose.  She denies any other acute change in her medical status.  HPI  Review of Systems She currently denies any headache, chest pain, shortness of breath, fever, chills, nausea, vomiting  Objective: Vital Signs: There were no vitals taken for this visit.  Physical Exam She is alert and orient x3 and in no acute distress Ortho Exam Examination of her neck does show pain along the midline and the lateral aspect of her neck with a positive Spurling sign to the left side.  She also has pain with positive Neer Hawkins signs of both shoulders with the left worse than the right. Specialty Comments:  No specialty comments available.  Imaging: No results found. The cervical spine MRI does show facet disease at C3-C4 and 4 5 as well as 6 7.  This is mainly to the left side as well and is causing some indentation of the cord but no myelopathy.  This is to the left side.  PMFS History: Patient Active Problem List   Diagnosis Date Noted  . Diverticulitis 10/15/2018  . S/P lumbar fusion 09/16/2018  . S/P lumbar laminectomy 12/31/2017  . Glomerulonephritis 09/30/2017  . Elevated serum immunoglobulin free light chain level 08/26/2017  . Chronic kidney disease, stage 3 ([place])  06/07/2015  . Choledocholithiasis 06/07/2015  . Aspiration pneumonitis ([place]) 06/06/2015  . Anemia 06/06/2015  . Diabetes mellitus type 2 in obese ([place]) 05/25/2015  . Elevated liver enzymes 05/24/2015  . RUQ abdominal pain 05/24/2015  . Hypercholesterolemia 10/27/2014  . Obesity 02/15/2009  . CATARACT, RIGHT EYE 02/15/2009  . Essential hypertension 02/15/2009  . GERD 02/15/2009   Past Medical History:  Diagnosis Date  . Anemia   . Aspiration pneumonia ([place])    after common bile duct obstruction  . CATARACT, RIGHT EYE    had repaired  . Depression   . Diverticulitis    recurrent episodes  . DM   . GERD   . 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 Mild to mod TI scarring. (Dr. Lorrene Reid)  . HNP (herniated nucleus pulposus), lumbar    Recurrent  . HYPERLIPIDEMIA-MIXED   . HYPERTENSION, UNSPECIFIED   . OBESITY   . PONV (postoperative nausea and vomiting)    past history only, "sometimes they put a patch on me"    Family History  Problem Relation Age of Onset  . Cancer Maternal Aunt        breast cancer  . Cancer Maternal Grandmother        kidney cancer   . Cancer Paternal Grandmother        bone cancer   . Heart disease Mother   . Alzheimer's disease Father   . Tremor Father        possible PD  . Diabetes Mellitus II Brother   . Healthy Son   . Healthy Son     Past Surgical History:  Procedure Laterality Date  . ABDOMINAL HYSTERECTOMY    . APPENDECTOMY    . BACK SURGERY     lumbar '09"microdiscectomy"  . CHOLECYSTECTOMY     open  . COLONOSCOPY WITH PROPOFOL N/A 11/22/2014   Procedure: COLONOSCOPY WITH PROPOFOL;  Surgeon: Garlan Fair, MD;  Location: [facility];  Service: Endoscopy;  Laterality: N/A;  . ERCP N/A 05/26/2015   Procedure: ENDOSCOPIC RETROGRADE CHOLANGIOPANCREATOGRAPHY (ERCP)   (DOING CASE IN MAIN OR);  Surgeon: Teena Irani, MD;  Location: [facility];  Service: Gastroenterology;  Laterality:  N/A;  . ERCP N/A 06/06/2015   Procedure: ENDOSCOPIC RETROGRADE CHOLANGIOPANCREATOGRAPHY (ERCP);  Surgeon: Clarene Essex, MD;  Location: [facility];  Service: Endoscopy;  Laterality: N/A;  . ESOPHAGOGASTRODUODENOSCOPY (EGD) WITH PROPOFOL N/A 11/22/2014   Procedure: ESOPHAGOGASTRODUODENOSCOPY (EGD) WITH PROPOFOL;  Surgeon: Garlan Fair, MD;  Location: [facility];  Service: Endoscopy;  Laterality: N/A;  . EYE SURGERY Bilateral    cataracts  . LUMBAR FUSION  09/16/2018  . LUMBAR LAMINECTOMY/DECOMPRESSION MICRODISCECTOMY Left 12/31/2017   Procedure: Laminectomy and Foraminotomy - Lumbar Three-Four - left Extraforaminal diskectomy Lumbar Four-Five left;  Surgeon: Eustace Moore, MD;  Location: [facility];  Service: Neurosurgery;  Laterality: Left;  . PERONEAL NERVE DECOMPRESSION Left 12/31/2017  Procedure: Left PERONEAL NERVE DECOMPRESSION;  Surgeon: Eustace Moore, MD;  Location: [facility];  Service: Neurosurgery;  Laterality: Left;  left  . TUBAL LIGATION     Social History   Occupational History  . Occupation: retired  Tobacco Use  . Smoking status: Never Smoker  . Smokeless tobacco: Never Used  Substance and Sexual Activity  . Alcohol use: No  . Drug use: No  . Sexual activity: Not on file

## 2020-03-16 DIAGNOSIS — M542 Cervicalgia: Secondary | ICD-10-CM | POA: Diagnosis not present

## 2020-03-22 DIAGNOSIS — M542 Cervicalgia: Secondary | ICD-10-CM | POA: Diagnosis not present

## 2020-03-29 ENCOUNTER — Ambulatory Visit: Payer: PPO | Admitting: Orthopaedic Surgery

## 2020-03-30 DIAGNOSIS — I129 Hypertensive chronic kidney disease with stage 1 through stage 4 chronic kidney disease, or unspecified chronic kidney disease: Secondary | ICD-10-CM | POA: Diagnosis not present

## 2020-03-30 DIAGNOSIS — M542 Cervicalgia: Secondary | ICD-10-CM | POA: Diagnosis not present

## 2020-03-30 DIAGNOSIS — E1129 Type 2 diabetes mellitus with other diabetic kidney complication: Secondary | ICD-10-CM | POA: Diagnosis not present

## 2020-03-30 DIAGNOSIS — D631 Anemia in chronic kidney disease: Secondary | ICD-10-CM | POA: Diagnosis not present

## 2020-03-30 DIAGNOSIS — N189 Chronic kidney disease, unspecified: Secondary | ICD-10-CM | POA: Diagnosis not present

## 2020-03-30 DIAGNOSIS — R809 Proteinuria, unspecified: Secondary | ICD-10-CM | POA: Diagnosis not present

## 2020-03-30 DIAGNOSIS — N052 Unspecified nephritic syndrome with diffuse membranous glomerulonephritis: Secondary | ICD-10-CM | POA: Diagnosis not present

## 2020-04-04 DIAGNOSIS — M542 Cervicalgia: Secondary | ICD-10-CM | POA: Diagnosis not present

## 2020-04-12 ENCOUNTER — Other Ambulatory Visit: Payer: Self-pay

## 2020-04-12 ENCOUNTER — Encounter: Payer: Self-pay | Admitting: Orthopaedic Surgery

## 2020-04-12 ENCOUNTER — Ambulatory Visit: Payer: PPO | Admitting: Orthopaedic Surgery

## 2020-04-12 DIAGNOSIS — G8929 Other chronic pain: Secondary | ICD-10-CM

## 2020-04-12 DIAGNOSIS — M25512 Pain in left shoulder: Secondary | ICD-10-CM | POA: Diagnosis not present

## 2020-04-12 NOTE — Progress Notes (Signed)
The patient comes in today for follow-up of her left shoulder.  She has severe impingement syndrome.  She had a steroid injection in that left shoulder on June 1.  She is also been doing physical therapy.  At this point she says she is doing much better since the injection and therapy and is very satisfied.  On exam her left shoulder moves smoothly and fluidly.  She has no significant pain at all with the left shoulder.  At this point follow-up can be as needed since she is doing so well.  If things worsen in anyway she will let us know.  All questions and concerns were answered and addressed.

## 2020-04-17 DIAGNOSIS — M542 Cervicalgia: Secondary | ICD-10-CM | POA: Diagnosis not present

## 2020-04-19 DIAGNOSIS — K5792 Diverticulitis of intestine, part unspecified, without perforation or abscess without bleeding: Secondary | ICD-10-CM | POA: Diagnosis not present

## 2020-04-19 DIAGNOSIS — J309 Allergic rhinitis, unspecified: Secondary | ICD-10-CM | POA: Diagnosis not present

## 2020-04-23 IMAGING — CT CT L SPINE W/O CM
3 of 6 series · 14 of 33 positions shown, 16 images · non-contrast
Comparison: None.

CLINICAL DATA: Left-sided back pain

EXAM:
CT LUMBAR SPINE WITHOUT CONTRAST
TECHNIQUE: Multidetector CT imaging of the lumbar spine was performed without
intravenous contrast administration. Multiplanar CT image
reconstructions were also generated.

[Series 4: l spine st · axial · 0.25mm/px · z∈[+1115,+1277]mm · 8 of 104 slices shown, 10 images]
[im 12/104  soft-tissue]
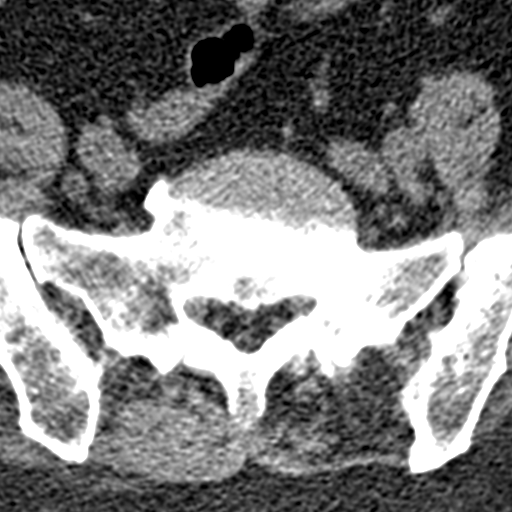
[im 12/104  bone]
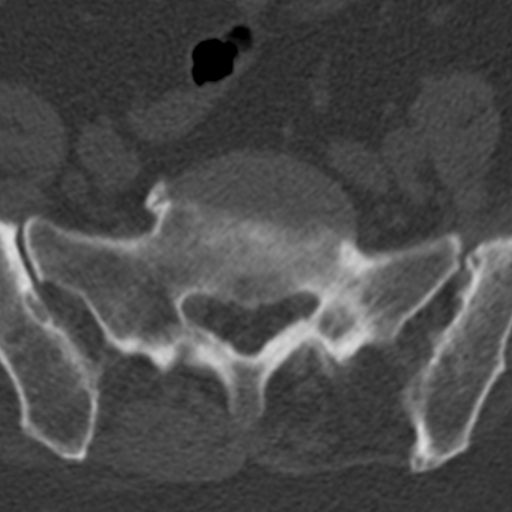
[im 23/104  bone]
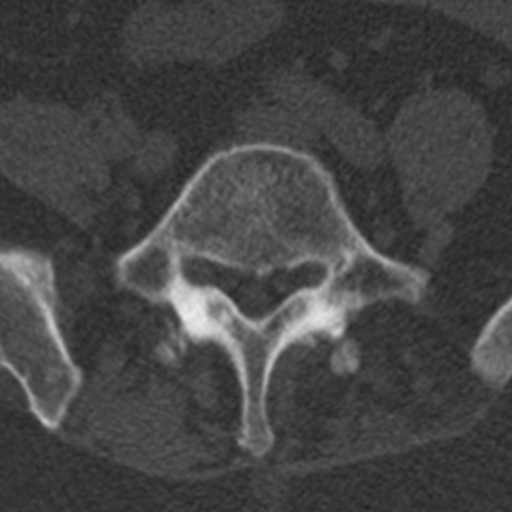
[im 35/104  bone]
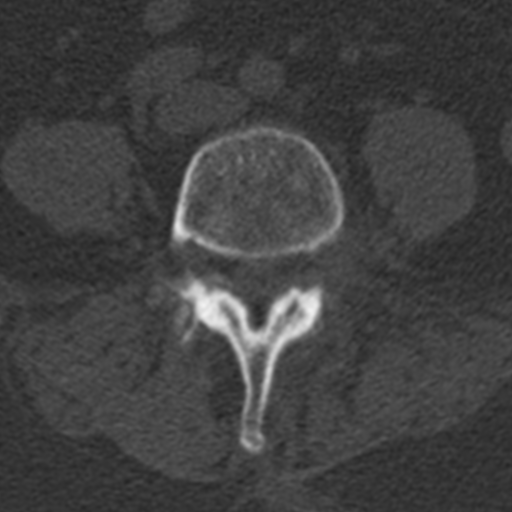
[im 46/104  bone]
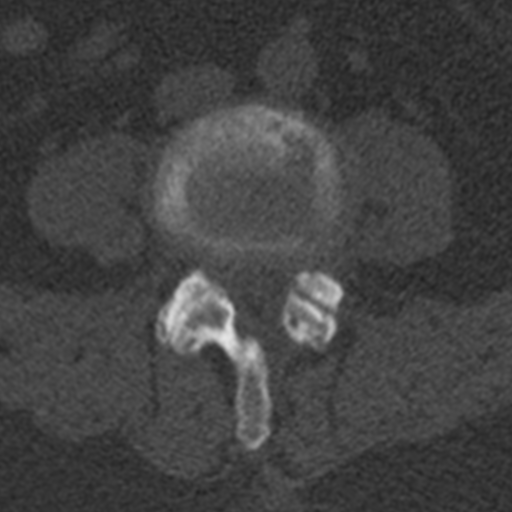
[im 58/104  soft-tissue]
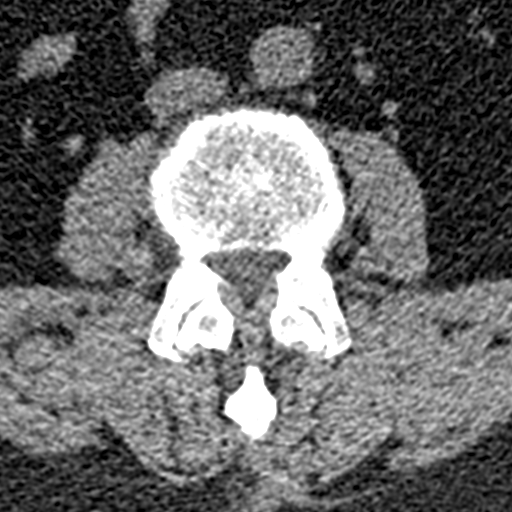
[im 58/104  bone]
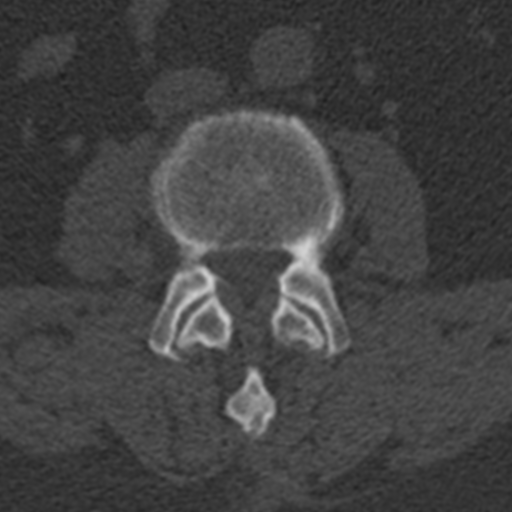
[im 69/104  bone]
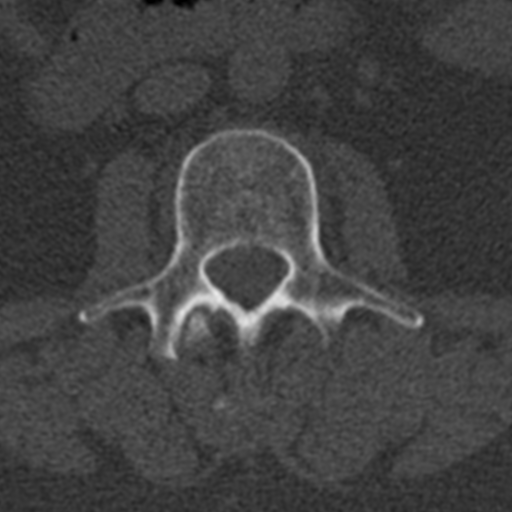
[im 81/104  bone]
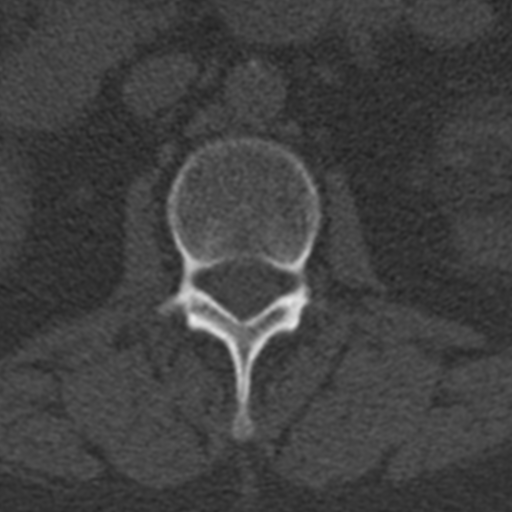
[im 92/104  bone]
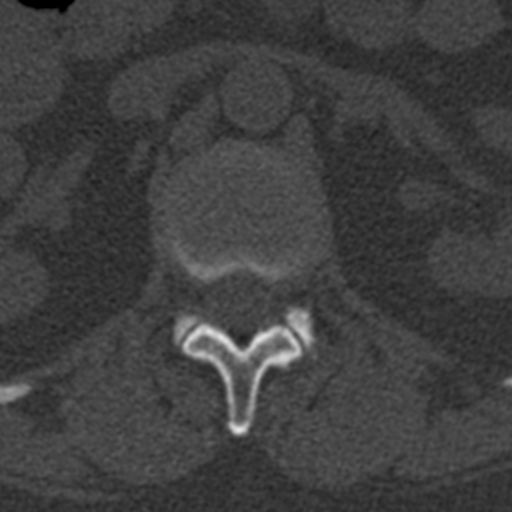

[Series 9: coronal bone · coronal · 0.31mm/px · 1 of 61 slices shown]
[im 31/61  bone]
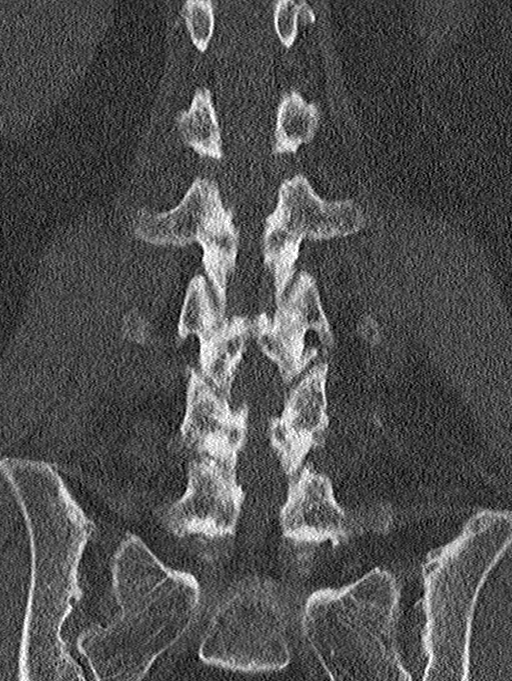

[Series 18: sagittal st · sagittal · 0.20mm/px · 5 of 62 slices shown]
[im 11/62  bone]
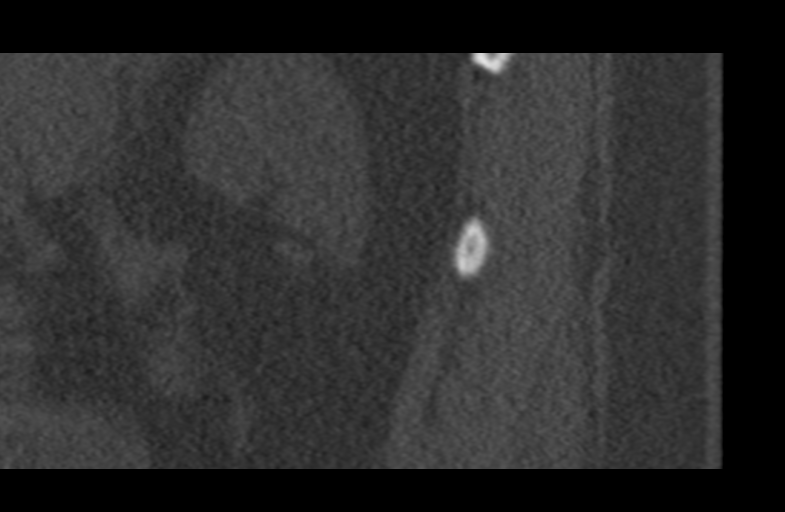
[im 21/62  bone]
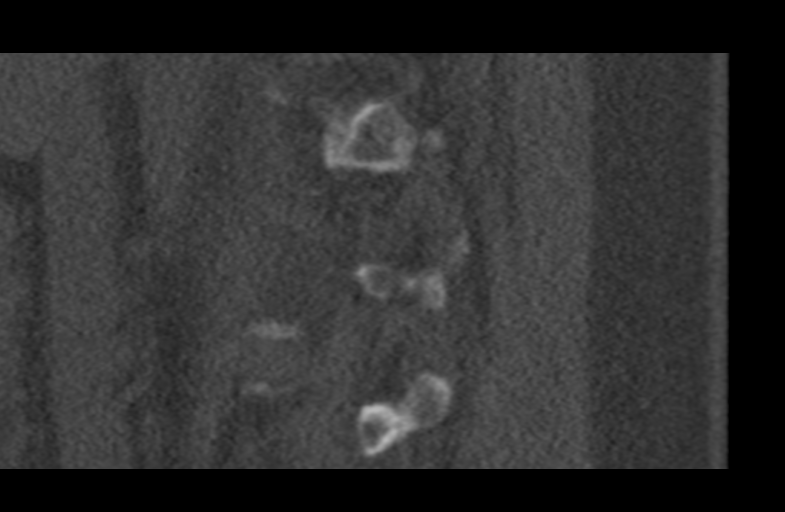
[im 31/62  bone]
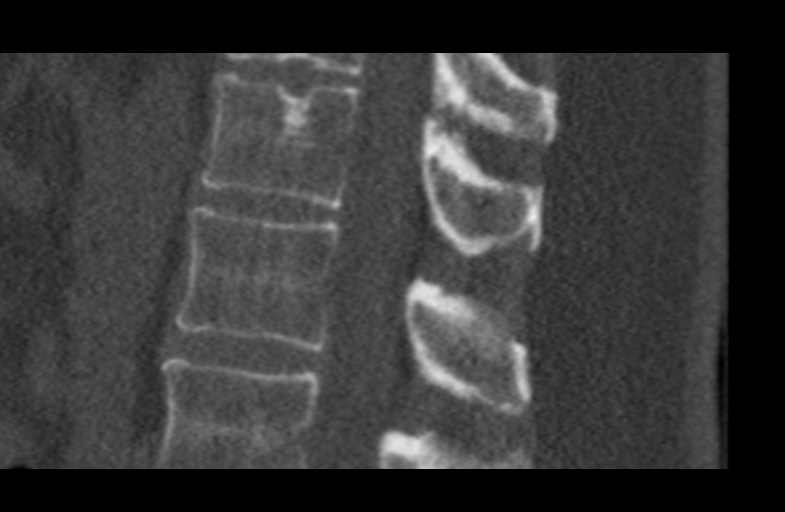
[im 41/62  bone]
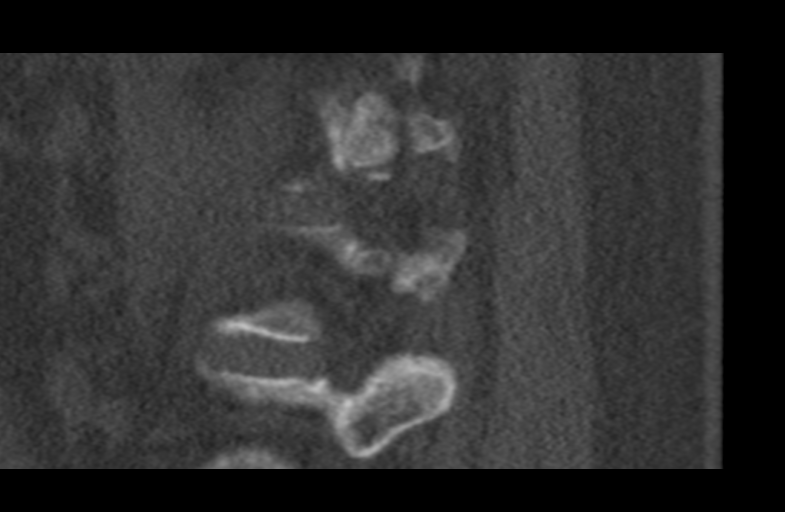
[im 51/62  bone]
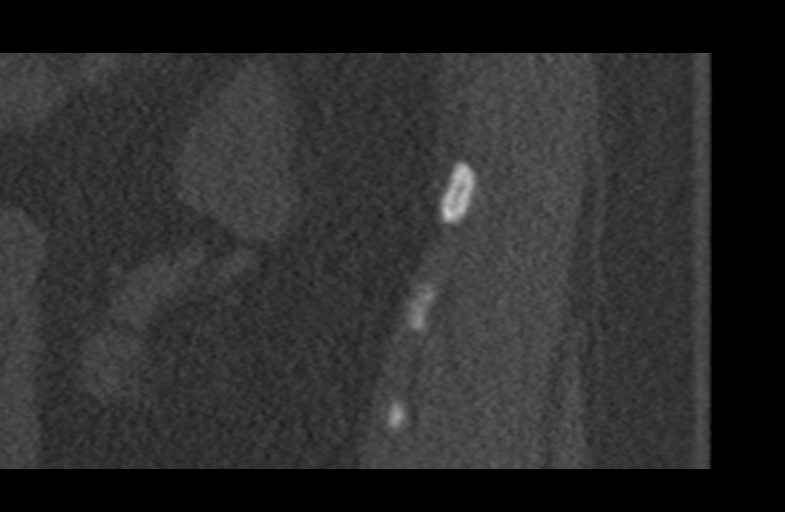

[14 of 33 positions shown; findings below may reference images not displayed]

FINDINGS: Segmentation: 5 lumbar type vertebrae.

Alignment: Normal.

Vertebrae: No acute fracture or focal pathologic process.

Paraspinal and other soft tissues: Negative.

Disc levels:

T12-L1: Unremarkable.

L1-2: Unremarkable.

L2-3: Mild facet hypertrophy. No spinal canal or neural foraminal
stenosis.

L3-L4: Disc space narrowing with small disc bulge. Mild facet
hypertrophy. No spinal canal stenosis.

L4-5: Left foraminal disc protrusion with encroachment on the
exiting left L4 nerve root. No central spinal canal or right neural
foraminal stenosis.

L5-S1: Unremarkable.
IMPRESSION: Left foraminal disc protrusion at L4-5 with encroachment on the
exiting left L4 nerve root.

## 2020-05-01 DIAGNOSIS — H52203 Unspecified astigmatism, bilateral: Secondary | ICD-10-CM | POA: Diagnosis not present

## 2020-05-01 DIAGNOSIS — E119 Type 2 diabetes mellitus without complications: Secondary | ICD-10-CM | POA: Diagnosis not present

## 2020-05-01 DIAGNOSIS — Z961 Presence of intraocular lens: Secondary | ICD-10-CM | POA: Diagnosis not present

## 2020-05-01 DIAGNOSIS — H353132 Nonexudative age-related macular degeneration, bilateral, intermediate dry stage: Secondary | ICD-10-CM | POA: Diagnosis not present

## 2020-05-11 DIAGNOSIS — E119 Type 2 diabetes mellitus without complications: Secondary | ICD-10-CM | POA: Diagnosis not present

## 2020-05-11 DIAGNOSIS — Z794 Long term (current) use of insulin: Secondary | ICD-10-CM | POA: Diagnosis not present

## 2020-05-15 ENCOUNTER — Encounter: Payer: PPO | Attending: Internal Medicine | Admitting: Nutrition

## 2020-05-15 ENCOUNTER — Other Ambulatory Visit: Payer: Self-pay

## 2020-05-15 DIAGNOSIS — E1169 Type 2 diabetes mellitus with other specified complication: Secondary | ICD-10-CM

## 2020-05-15 DIAGNOSIS — E669 Obesity, unspecified: Secondary | ICD-10-CM | POA: Diagnosis not present

## 2020-05-15 NOTE — Progress Notes (Signed)
Opened in error

## 2020-05-16 NOTE — Progress Notes (Signed)
This patient was identified by name and DOB.  She is here for [employer].  She does not have the sensor, but was told that she would get one here.  She was given a sample Dexcom  [id number]   Exp:3/22and shown how to use this, and it was linked to her phone with the Dexcom G6 app, so she can see her blood sugar readings.   Paperwork was filled out and carried down to [place] at [facility] for a physician signature and to fax to [facility] for this prescription.    It was explained that [facility] will see which way this is best billed to her Medicare, and which DME provider she needs to use.  They will then contact her with her cost and will send her the supplies needed.  She had no final questions.  She did want general information about her diet, saying that she is gaining weight over the last year, and not knowing why.   We discussed the importance of balancing the meal with some foods from each of the 3 main groups.  We also discussed the idea of insulin resistance, and ways she can decrease this with weight loss, exercise and lowering blood sugar.  We reviewed the 3 main components of her diet, and how each one affects blood sugars.Discussed what was in each group, and how fats were the highest calorie food group,  and to try to limit them to no more than 1 tsp. at each meal.  She reported good understanding of this with no final questions

## 2020-05-16 NOTE — Patient Instructions (Addendum)
Change sensor every 10 days Change transmitter every 3 months.   

## 2020-05-26 ENCOUNTER — Ambulatory Visit
Admission: RE | Admit: 2020-05-26 | Discharge: 2020-05-26 | Disposition: A | Discharge status: Home / Self Care | Payer: PPO | Source: Ambulatory Visit | Attending: Physician Assistant | Admitting: Physician Assistant

## 2020-05-26 ENCOUNTER — Other Ambulatory Visit: Payer: Self-pay | Admitting: Physician Assistant

## 2020-05-26 DIAGNOSIS — Z8719 Personal history of other diseases of the digestive system: Secondary | ICD-10-CM

## 2020-05-26 DIAGNOSIS — R1012 Left upper quadrant pain: Secondary | ICD-10-CM | POA: Diagnosis not present

## 2020-05-26 DIAGNOSIS — R1032 Left lower quadrant pain: Secondary | ICD-10-CM

## 2020-05-26 DIAGNOSIS — R1013 Epigastric pain: Secondary | ICD-10-CM

## 2020-05-26 DIAGNOSIS — I700 Atherosclerosis of aorta: Secondary | ICD-10-CM | POA: Diagnosis not present

## 2020-05-26 DIAGNOSIS — Z9049 Acquired absence of other specified parts of digestive tract: Secondary | ICD-10-CM | POA: Diagnosis not present

## 2020-05-26 DIAGNOSIS — K5730 Diverticulosis of large intestine without perforation or abscess without bleeding: Secondary | ICD-10-CM | POA: Diagnosis not present

## 2020-05-26 DIAGNOSIS — K449 Diaphragmatic hernia without obstruction or gangrene: Secondary | ICD-10-CM | POA: Diagnosis not present

## 2020-05-26 MED ORDER — IOPAMIDOL (ISOVUE-300) INJECTION 61%
80.0000 mL | Freq: Once | INTRAVENOUS | Status: AC | PRN
  Administered 2020-05-26: 80 mL via INTRAVENOUS

## 2020-06-02 DIAGNOSIS — N052 Unspecified nephritic syndrome with diffuse membranous glomerulonephritis: Secondary | ICD-10-CM | POA: Diagnosis not present

## 2020-06-02 DIAGNOSIS — E538 Deficiency of other specified B group vitamins: Secondary | ICD-10-CM | POA: Diagnosis not present

## 2020-06-02 DIAGNOSIS — I10 Essential (primary) hypertension: Secondary | ICD-10-CM | POA: Diagnosis not present

## 2020-06-02 DIAGNOSIS — F322 Major depressive disorder, single episode, severe without psychotic features: Secondary | ICD-10-CM | POA: Diagnosis not present

## 2020-06-02 DIAGNOSIS — E782 Mixed hyperlipidemia: Secondary | ICD-10-CM | POA: Diagnosis not present

## 2020-06-02 DIAGNOSIS — K219 Gastro-esophageal reflux disease without esophagitis: Secondary | ICD-10-CM | POA: Diagnosis not present

## 2020-06-02 DIAGNOSIS — I700 Atherosclerosis of aorta: Secondary | ICD-10-CM | POA: Diagnosis not present

## 2020-06-02 DIAGNOSIS — E1122 Type 2 diabetes mellitus with diabetic chronic kidney disease: Secondary | ICD-10-CM | POA: Diagnosis not present

## 2020-06-02 DIAGNOSIS — E559 Vitamin D deficiency, unspecified: Secondary | ICD-10-CM | POA: Diagnosis not present

## 2020-06-02 DIAGNOSIS — N1831 Chronic kidney disease, stage 3a: Secondary | ICD-10-CM | POA: Diagnosis not present

## 2020-06-02 DIAGNOSIS — M519 Unspecified thoracic, thoracolumbar and lumbosacral intervertebral disc disorder: Secondary | ICD-10-CM | POA: Diagnosis not present

## 2020-06-02 DIAGNOSIS — G629 Polyneuropathy, unspecified: Secondary | ICD-10-CM | POA: Diagnosis not present

## 2020-06-02 DIAGNOSIS — Z0000 Encounter for general adult medical examination without abnormal findings: Secondary | ICD-10-CM | POA: Diagnosis not present

## 2020-06-02 DIAGNOSIS — Z1389 Encounter for screening for other disorder: Secondary | ICD-10-CM | POA: Diagnosis not present

## 2020-06-06 DIAGNOSIS — Z1211 Encounter for screening for malignant neoplasm of colon: Secondary | ICD-10-CM | POA: Diagnosis not present

## 2020-06-14 DIAGNOSIS — C44319 Basal cell carcinoma of skin of other parts of face: Secondary | ICD-10-CM | POA: Diagnosis not present

## 2020-06-14 DIAGNOSIS — D2262 Melanocytic nevi of left upper limb, including shoulder: Secondary | ICD-10-CM | POA: Diagnosis not present

## 2020-06-14 DIAGNOSIS — D225 Melanocytic nevi of trunk: Secondary | ICD-10-CM | POA: Diagnosis not present

## 2020-06-14 DIAGNOSIS — D2261 Melanocytic nevi of right upper limb, including shoulder: Secondary | ICD-10-CM | POA: Diagnosis not present

## 2020-06-14 DIAGNOSIS — D1801 Hemangioma of skin and subcutaneous tissue: Secondary | ICD-10-CM | POA: Diagnosis not present

## 2020-06-14 DIAGNOSIS — L814 Other melanin hyperpigmentation: Secondary | ICD-10-CM | POA: Diagnosis not present

## 2020-06-14 DIAGNOSIS — D2271 Melanocytic nevi of right lower limb, including hip: Secondary | ICD-10-CM | POA: Diagnosis not present

## 2020-06-14 DIAGNOSIS — L720 Epidermal cyst: Secondary | ICD-10-CM | POA: Diagnosis not present

## 2020-06-14 DIAGNOSIS — D485 Neoplasm of uncertain behavior of skin: Secondary | ICD-10-CM | POA: Diagnosis not present

## 2020-06-14 DIAGNOSIS — D2272 Melanocytic nevi of left lower limb, including hip: Secondary | ICD-10-CM | POA: Diagnosis not present

## 2020-06-14 DIAGNOSIS — Z85828 Personal history of other malignant neoplasm of skin: Secondary | ICD-10-CM | POA: Diagnosis not present

## 2020-06-14 DIAGNOSIS — L821 Other seborrheic keratosis: Secondary | ICD-10-CM | POA: Diagnosis not present

## 2020-06-14 DIAGNOSIS — L739 Follicular disorder, unspecified: Secondary | ICD-10-CM | POA: Diagnosis not present

## 2020-06-22 DIAGNOSIS — E119 Type 2 diabetes mellitus without complications: Secondary | ICD-10-CM | POA: Diagnosis not present

## 2020-06-23 DIAGNOSIS — Z85828 Personal history of other malignant neoplasm of skin: Secondary | ICD-10-CM | POA: Diagnosis not present

## 2020-06-23 DIAGNOSIS — C44319 Basal cell carcinoma of skin of other parts of face: Secondary | ICD-10-CM | POA: Diagnosis not present

## 2020-06-23 DIAGNOSIS — L821 Other seborrheic keratosis: Secondary | ICD-10-CM | POA: Diagnosis not present

## 2020-07-17 DIAGNOSIS — E119 Type 2 diabetes mellitus without complications: Secondary | ICD-10-CM | POA: Diagnosis not present

## 2020-07-17 DIAGNOSIS — H5310 Unspecified subjective visual disturbances: Secondary | ICD-10-CM | POA: Diagnosis not present

## 2020-07-17 DIAGNOSIS — Z961 Presence of intraocular lens: Secondary | ICD-10-CM | POA: Diagnosis not present

## 2020-07-17 DIAGNOSIS — H353112 Nonexudative age-related macular degeneration, right eye, intermediate dry stage: Secondary | ICD-10-CM | POA: Diagnosis not present

## 2020-07-19 ENCOUNTER — Encounter (INDEPENDENT_AMBULATORY_CARE_PROVIDER_SITE_OTHER): Payer: Self-pay | Admitting: Ophthalmology

## 2020-07-19 DIAGNOSIS — K5792 Diverticulitis of intestine, part unspecified, without perforation or abscess without bleeding: Secondary | ICD-10-CM | POA: Diagnosis not present

## 2020-07-19 DIAGNOSIS — N052 Unspecified nephritic syndrome with diffuse membranous glomerulonephritis: Secondary | ICD-10-CM | POA: Diagnosis not present

## 2020-07-19 DIAGNOSIS — N2581 Secondary hyperparathyroidism of renal origin: Secondary | ICD-10-CM | POA: Diagnosis not present

## 2020-07-19 DIAGNOSIS — N189 Chronic kidney disease, unspecified: Secondary | ICD-10-CM | POA: Diagnosis not present

## 2020-07-19 DIAGNOSIS — E1129 Type 2 diabetes mellitus with other diabetic kidney complication: Secondary | ICD-10-CM | POA: Diagnosis not present

## 2020-07-19 DIAGNOSIS — G250 Essential tremor: Secondary | ICD-10-CM | POA: Diagnosis not present

## 2020-07-19 DIAGNOSIS — I129 Hypertensive chronic kidney disease with stage 1 through stage 4 chronic kidney disease, or unspecified chronic kidney disease: Secondary | ICD-10-CM | POA: Diagnosis not present

## 2020-07-19 DIAGNOSIS — D631 Anemia in chronic kidney disease: Secondary | ICD-10-CM | POA: Diagnosis not present

## 2020-07-19 DIAGNOSIS — Z23 Encounter for immunization: Secondary | ICD-10-CM | POA: Diagnosis not present

## 2020-07-27 NOTE — Progress Notes (Signed)
[facility] Note  07/31/2020     CHIEF COMPLAINT Patient presents for Retina Evaluation   HISTORY OF PRESENT ILLNESS: Tina Briggs is a 74 y.o. female who presents to the clinic today for:   HPI    Retina Evaluation    In both eyes.  This started months ago.  Duration of months.  Associated Symptoms Flashes.  Context:  distance vision and near vision.  I, the attending physician,  performed the HPI with the patient and updated documentation appropriately.          Comments    NP retina eval ref by Dr. Kathrin Penner Pt states her vision in her right eye has been worsening over the last few months.  Pt states left eye is stable.  Patient states she occasionally has sharp, shooting pain in right eye--not using tears or other eye drops.  Patient denies eye pain OS.  Patient has history of flashes of light OD--says it only happened once, more than 10 years ago.  Hx of CEIOL OU (Dr Kathrin Penner, 10-12 yrs ago) States gls Rx has always been very mild, even prior to cataract surgery.       Last edited by Bernarda Caffey, MD on 07/31/2020  3:37 PM. (History)    pt is here on the referral of Dr. Kathrin Penner for concern of non-exu ARMD, pt states she has a hard time reading, she states she plays the piano and after playing for awhile the notes start to run together, she feels like this only affect the right eye, and the left eye is clear, pt is diabetic and takes Novolog, her last A1c was 6.8 on 09/09/2018, pt is taking AREDS 2 and monitoring her vision with an amsler grid  Referring physician: [name], MD [place],  [place] [address]  HISTORICAL INFORMATION:   Selected notes from the MEDICAL RECORD NUMBER Referred by Dr. Kathrin Penner to eval AMD OD LEE: 10.18.21 Ocular Hx- AMD OD PMH-    CURRENT MEDICATIONS: No current outpatient medications on file. (Ophthalmic Drugs)   No current facility-administered medications for this visit.  (Ophthalmic Drugs)   Current Outpatient Medications (Other)  Medication Sig  . desloratadine (CLARINEX) 5 MG tablet Take 5 mg by mouth daily.  . empagliflozin (JARDIANCE) 10 MG TABS tablet Take by mouth daily.  Marland Kitchen LORazepam (ATIVAN) 0.5 MG tablet Take 0.5 mg by mouth every 8 (eight) hours.  . pantoprazole (PROTONIX) 20 MG tablet Take 20 mg by mouth daily.  Marland Kitchen acetaminophen (TYLENOL) 500 MG tablet Take 1,000 mg by mouth 2 (two) times daily as needed for moderate pain or headache.   Marland Kitchen amLODipine (NORVASC) 10 MG tablet Take 10 mg by mouth daily.  Marland Kitchen aspirin EC 81 MG tablet Take 81 mg by mouth daily.  . cholecalciferol (VITAMIN D) 1000 units tablet Take 1,000 Units by mouth daily.  . CRESTOR 40 MG tablet TAKE 1 TABLET AT BEDTIME (Patient taking differently: Take 40 mg by mouth at bedtime. )  . fluticasone (FLONASE) 50 MCG/ACT nasal spray Place 2 sprays into both nostrils daily as needed for allergies.   Marland Kitchen gabapentin (NEURONTIN) 300 MG capsule Take 300 mg by mouth 4 (four) times daily.  . insulin aspart (NOVOLOG) 100 UNIT/ML injection Inject 18-34 Units into the skin 3 (three) times daily before meals. Sliding scale  . venlafaxine XR (EFFEXOR-XR) 75 MG 24 hr capsule Take 75 mg by mouth every morning.   . vitamin B-12 (CYANOCOBALAMIN) 1000  MCG tablet Take 1,000 mcg by mouth 3 (three) times a week. Take on Sundays, Wednesdays and Fridays.   No current facility-administered medications for this visit. (Other)      REVIEW OF SYSTEMS: ROS    Positive for: Endocrine, Cardiovascular, Eyes   Negative for: Constitutional, Gastrointestinal, Neurological, Skin, Genitourinary, Musculoskeletal, HENT, Respiratory, Psychiatric, Allergic/Imm, Heme/Lymph   Last edited by Doneen Poisson on 07/31/2020  1:48 PM. (History)       ALLERGIES Allergies  Allergen Reactions  . Codeine Shortness Of Breath  . Amoxicillin   . Flagyl [Metronidazole]   . Prevacid [Lansoprazole]   . Tetracyclines & Related Itching   . Trulicity [Dulaglutide]   . Azithromycin Itching and Rash  . Erythromycin Itching and Rash  . Morphine And Related Itching and Rash  . Penicillins Itching and Rash    Has patient had a PCN reaction causing immediate rash, facial/tongue/throat swelling, SOB or lightheadedness with hypotension: Yes Has patient had a PCN reaction causing severe rash involving mucus membranes or skin necrosis: No Has patient had a PCN reaction that required hospitalization: No Has patient had a PCN reaction occurring within the last 10 years: No If all of the above answers are "NO", then may proceed with Cephalosporin use.   [name] [Sulfamethoxazole-Trimethoprim] Itching and Rash    PAST MEDICAL HISTORY Past Medical History:  Diagnosis Date  . Anemia   . Aspiration pneumonia ([place])    after common bile duct obstruction  . CATARACT, RIGHT EYE    had repaired  . Depression   . Diverticulitis    recurrent episodes  . DM   . GERD   . 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 Mild to mod TI scarring. (Dr. Lorrene Reid)  . HNP (herniated nucleus pulposus), lumbar    Recurrent  . HYPERLIPIDEMIA-MIXED   . HYPERTENSION, UNSPECIFIED   . OBESITY   . PONV (postoperative nausea and vomiting)    past history only, "sometimes they put a patch on me"   Past Surgical History:  Procedure Laterality Date  . ABDOMINAL HYSTERECTOMY    . APPENDECTOMY    . BACK SURGERY     lumbar '09"microdiscectomy"  . CHOLECYSTECTOMY     open  . COLONOSCOPY WITH PROPOFOL N/A 11/22/2014   Procedure: COLONOSCOPY WITH PROPOFOL;  Surgeon: Garlan Fair, MD;  Location: [facility];  Service: Endoscopy;  Laterality: N/A;  . ERCP N/A 05/26/2015   Procedure: ENDOSCOPIC RETROGRADE CHOLANGIOPANCREATOGRAPHY (ERCP)   (DOING CASE IN MAIN OR);  Surgeon: Teena Irani, MD;  Location: [facility];  Service: Gastroenterology;  Laterality: N/A;  . ERCP N/A 06/06/2015   Procedure: ENDOSCOPIC  RETROGRADE CHOLANGIOPANCREATOGRAPHY (ERCP);  Surgeon: Clarene Essex, MD;  Location: [facility];  Service: Endoscopy;  Laterality: N/A;  . ESOPHAGOGASTRODUODENOSCOPY (EGD) WITH PROPOFOL N/A 11/22/2014   Procedure: ESOPHAGOGASTRODUODENOSCOPY (EGD) WITH PROPOFOL;  Surgeon: Garlan Fair, MD;  Location: [facility];  Service: Endoscopy;  Laterality: N/A;  . EYE SURGERY Bilateral    cataracts  . LUMBAR FUSION  09/16/2018  . LUMBAR LAMINECTOMY/DECOMPRESSION MICRODISCECTOMY Left 12/31/2017   Procedure: Laminectomy and Foraminotomy - Lumbar Three-Four - left Extraforaminal diskectomy Lumbar Four-Five left;  Surgeon: Eustace Moore, MD;  Location: [facility];  Service: Neurosurgery;  Laterality: Left;  . PERONEAL NERVE DECOMPRESSION Left 12/31/2017   Procedure: Left PERONEAL NERVE DECOMPRESSION;  Surgeon: Eustace Moore, MD;  Location: [facility];  Service: Neurosurgery;  Laterality: Left;  left  . TUBAL LIGATION  FAMILY HISTORY Family History  Problem Relation Age of Onset  . Cancer Maternal Aunt        breast cancer  . Cancer Maternal Grandmother        kidney cancer   . Cancer Paternal Grandmother        bone cancer   . Heart disease Mother   . Alzheimer's disease Father   . Tremor Father        possible PD  . Diabetes Mellitus II Brother   . Healthy Son   . Healthy Son     SOCIAL HISTORY Social History   Tobacco Use  . Smoking status: Never Smoker  . Smokeless tobacco: Never Used  Vaping Use  . Vaping Use: Never used  Substance Use Topics  . Alcohol use: No  . Drug use: No         OPHTHALMIC EXAM:  Base Eye Exam    Visual Acuity (Snellen - Linear)      Right Left   Dist cc 20/25 +2 20/20   Dist ph cc NI    Correction: Glasses       Tonometry (Tonopen, 1:47 PM)      Right Left   Pressure 11 12       Pupils      Dark Light Shape React APD   Right 3 2 Round Brisk 0   Left 3 2 Round Brisk 0       Visual Fields      Left Right    Full Full       Extraocular  Movement      Right Left    Full Full       Neuro/Psych    Oriented x3: Yes   Mood/Affect: Normal       Dilation    Both eyes: 1.0% Mydriacyl, 2.5% Phenylephrine @ 1:47 PM        Slit Lamp and Fundus Exam    Slit Lamp Exam      Right Left   Lids/Lashes Dermatochalasis - upper lid Dermatochalasis - upper lid   Conjunctiva/Sclera White and quiet temporal pinguecula   Cornea Mild arcus, 1+ Punctate epithelial erosions, Debris in tear film, decreased TBUT, well healed temporal cataract wounds Mild arcus, 1-2+ Punctate epithelial erosions, Debris in tear film, decreased TBUT, well healed temporal cataract wounds   Anterior Chamber Deep and quiet Deep and quiet   Iris Round and moderately dilated Round and moderately dilated   Lens PC IOL in good position with open PC PC IOL in good position with open PC   Vitreous Posterior vitreous detachment Mild Vitreous syneresis, Posterior vitreous detachment       Fundus Exam      Right Left   Disc Pink and Sharp, Compact Pink and Sharp, Compact   C/D Ratio 0.3 0.4   Macula Flat, Blunted foveal reflex, Drusen, RPE mottling and clumping, focal pigment clump IN fovea, No heme or edema Flat, Blunted foveal reflex, Drusen, RPE mottling and clumping, focal pigment clump nasal fovea, No heme or edema   Vessels attenuated, mild tortuousity attenuated, mild tortuousity, mild AV crossing changes   Periphery Attached, reticular degeneration, No heme  Attached, reticular degeneration, No heme         Refraction    Wearing Rx      Sphere Cylinder Axis Add   Right -0.50 +0.75 116 +2.50   Left +0.25 +1.00 160 +2.50   Age: 68 yr   Type: prog  Manifest Refraction      Sphere Cylinder Axis Dist VA   Right -0.50 +0.75 115 20/25+2   Left -0.25 +1.00 160 20/20          IMAGING AND PROCEDURES  Imaging and Procedures for 07/31/2020  OCT, Retina - OU - Both Eyes       Right Eye Quality was good. Central Foveal Thickness: 300. Progression  has no prior data. Findings include normal foveal contour, no IRF, no SRF, retinal drusen  (Focal RPE irregularity IN macula).   Left Eye Quality was good. Central Foveal Thickness: 294. Progression has no prior data. Findings include normal foveal contour, no IRF, no SRF, retinal drusen  (Focal RPE irregularity nasal fovea).   Notes *Images captured and stored on drive  Diagnosis / Impression:  Non-exu ARMD with drusen and focal RPE disruptions OU  Clinical management:  See below  Abbreviations: NFP - Normal foveal profile. CME - cystoid macular edema. PED - pigment epithelial detachment. IRF - intraretinal fluid. SRF - subretinal fluid. EZ - ellipsoid zone. ERM - epiretinal membrane. ORA - outer retinal atrophy. ORT - outer retinal tubulation. SRHM - subretinal hyper-reflective material. IRHM - intraretinal hyper-reflective material                 ASSESSMENT/PLAN:    ICD-10-CM   1. Intermediate stage nonexudative age-related macular degeneration of both eyes  H35.3132   2. Retinal edema  H35.81 OCT, Retina - OU - Both Eyes  3. Essential hypertension  I10   4. Hypertensive retinopathy of both eyes  H35.033   5. Diabetes mellitus type 2 without retinopathy ([place])  E11.9   6. Pseudophakia of both eyes  Z96.1     1,2. Age related macular degeneration, non-exudative, OU  - intermediate stage w/ drusen and focal RPE disruptions OU  - properly started on AREDS 2 supplements and Amsler Grid monitoring by Dr. Kathrin Penner  - The incidence, anatomy, and pathology of dry AMD, risk of progression, and the AREDS and AREDS 2 studies including smoking risks discussed with patient.  - f/u 3-4 months, DFE, OCT  3,4. Hypertensive retinopathy OU - discussed importance of tight BP control - monitor  5. Diabetes mellitus, type 2 without retinopathy  - A1c was 6.8 on 12.11.2019 - The incidence, risk factors for progression, natural history and treatment options for diabetic retinopathy   were discussed with patient.   - The need for close monitoring of blood glucose, blood pressure, and serum lipids, avoiding cigarette or any type of tobacco, and the need for long term follow up was also discussed with patient. - f/u in 1 year, sooner prn  6. Pseudophakia OS  - s/p CE/IOL  - IOL in good position, doing well  - monitor    Ophthalmic Meds Ordered this visit:  No orders of the defined types were placed in this encounter.      Return for f/u 3-4 months, non-exu ARMD OU, DFE, OCT.  There are no Patient Instructions on file for this visit.  This document serves as a record of services personally performed by Gardiner Sleeper, MD, PhD. It was created on their behalf by Leeann Must, [place], an ophthalmic technician. The creation of this record is the provider's dictation and/or activities during the visit.    Electronically signed by: Leeann Must, COA @TODAY @ 3:41 PM  Gardiner Sleeper, M.D., Ph.D. Diseases & Surgery of the Retina and [place] 07/31/2020   I have  reviewed the above documentation for accuracy and completeness, and I agree with the above. Gardiner Sleeper, M.D., Ph.D. 07/31/20 3:41 PM   Abbreviations: M myopia (nearsighted); A astigmatism; H hyperopia (farsighted); P presbyopia; Mrx spectacle prescription;  CTL contact lenses; OD right eye; OS left eye; OU both eyes  XT exotropia; ET esotropia; PEK punctate epithelial keratitis; PEE punctate epithelial erosions; DES dry eye syndrome; MGD meibomian gland dysfunction; ATs artificial tears; PFAT's preservative free artificial tears; [place] nuclear sclerotic cataract; PSC posterior subcapsular cataract; ERM epi-retinal membrane; PVD posterior vitreous detachment; RD retinal detachment; DM diabetes mellitus; DR diabetic retinopathy; NPDR non-proliferative diabetic retinopathy; PDR proliferative diabetic retinopathy; CSME clinically significant macular edema; DME diabetic macular  edema; dbh dot blot hemorrhages; CWS cotton wool spot; POAG primary open angle glaucoma; C/D cup-to-disc ratio; HVF humphrey visual field; GVF goldmann visual field; OCT optical coherence tomography; IOP intraocular pressure; BRVO Branch retinal vein occlusion; CRVO central retinal vein occlusion; CRAO central retinal artery occlusion; BRAO branch retinal artery occlusion; RT retinal tear; SB scleral buckle; PPV pars plana vitrectomy; VH Vitreous hemorrhage; PRP panretinal laser photocoagulation; IVK intravitreal kenalog; VMT vitreomacular traction; MH Macular hole;  NVD neovascularization of the disc; NVE neovascularization elsewhere; AREDS age related eye disease study; ARMD age related macular degeneration; POAG primary open angle glaucoma; EBMD epithelial/anterior basement membrane dystrophy; ACIOL anterior chamber intraocular lens; IOL intraocular lens; PCIOL posterior chamber intraocular lens; Phaco/IOL phacoemulsification with intraocular lens placement; [place] photorefractive keratectomy; LASIK laser assisted in situ keratomileusis; HTN hypertension; DM diabetes mellitus; COPD chronic obstructive pulmonary disease

## 2020-07-31 ENCOUNTER — Encounter (INDEPENDENT_AMBULATORY_CARE_PROVIDER_SITE_OTHER): Payer: Self-pay | Admitting: Ophthalmology

## 2020-07-31 ENCOUNTER — Other Ambulatory Visit: Payer: Self-pay

## 2020-07-31 ENCOUNTER — Ambulatory Visit (INDEPENDENT_AMBULATORY_CARE_PROVIDER_SITE_OTHER): Payer: PPO | Admitting: Ophthalmology

## 2020-07-31 DIAGNOSIS — E119 Type 2 diabetes mellitus without complications: Secondary | ICD-10-CM

## 2020-07-31 DIAGNOSIS — Z961 Presence of intraocular lens: Secondary | ICD-10-CM

## 2020-07-31 DIAGNOSIS — H35033 Hypertensive retinopathy, bilateral: Secondary | ICD-10-CM

## 2020-07-31 DIAGNOSIS — H3581 Retinal edema: Secondary | ICD-10-CM

## 2020-07-31 DIAGNOSIS — I10 Essential (primary) hypertension: Secondary | ICD-10-CM | POA: Diagnosis not present

## 2020-07-31 DIAGNOSIS — H353132 Nonexudative age-related macular degeneration, bilateral, intermediate dry stage: Secondary | ICD-10-CM

## 2020-08-15 DIAGNOSIS — N1832 Chronic kidney disease, stage 3b: Secondary | ICD-10-CM | POA: Diagnosis not present

## 2020-08-15 DIAGNOSIS — E119 Type 2 diabetes mellitus without complications: Secondary | ICD-10-CM | POA: Diagnosis not present

## 2020-08-15 DIAGNOSIS — E1122 Type 2 diabetes mellitus with diabetic chronic kidney disease: Secondary | ICD-10-CM | POA: Diagnosis not present

## 2020-08-15 DIAGNOSIS — Z794 Long term (current) use of insulin: Secondary | ICD-10-CM | POA: Diagnosis not present

## 2020-09-14 DIAGNOSIS — E119 Type 2 diabetes mellitus without complications: Secondary | ICD-10-CM | POA: Diagnosis not present

## 2020-10-12 DIAGNOSIS — Z1231 Encounter for screening mammogram for malignant neoplasm of breast: Secondary | ICD-10-CM | POA: Diagnosis not present

## 2020-10-12 DIAGNOSIS — Z6832 Body mass index (BMI) 32.0-32.9, adult: Secondary | ICD-10-CM | POA: Diagnosis not present

## 2020-10-12 DIAGNOSIS — Z01419 Encounter for gynecological examination (general) (routine) without abnormal findings: Secondary | ICD-10-CM | POA: Diagnosis not present

## 2020-10-16 DIAGNOSIS — E119 Type 2 diabetes mellitus without complications: Secondary | ICD-10-CM | POA: Diagnosis not present

## 2020-10-18 DIAGNOSIS — N052 Unspecified nephritic syndrome with diffuse membranous glomerulonephritis: Secondary | ICD-10-CM | POA: Diagnosis not present

## 2020-10-18 DIAGNOSIS — E1122 Type 2 diabetes mellitus with diabetic chronic kidney disease: Secondary | ICD-10-CM | POA: Diagnosis not present

## 2020-10-18 DIAGNOSIS — E785 Hyperlipidemia, unspecified: Secondary | ICD-10-CM | POA: Diagnosis not present

## 2020-10-18 DIAGNOSIS — N1831 Chronic kidney disease, stage 3a: Secondary | ICD-10-CM | POA: Diagnosis not present

## 2020-10-18 DIAGNOSIS — I129 Hypertensive chronic kidney disease with stage 1 through stage 4 chronic kidney disease, or unspecified chronic kidney disease: Secondary | ICD-10-CM | POA: Diagnosis not present

## 2020-10-23 DIAGNOSIS — K591 Functional diarrhea: Secondary | ICD-10-CM | POA: Diagnosis not present

## 2020-10-23 DIAGNOSIS — R1319 Other dysphagia: Secondary | ICD-10-CM | POA: Diagnosis not present

## 2020-10-23 DIAGNOSIS — K219 Gastro-esophageal reflux disease without esophagitis: Secondary | ICD-10-CM | POA: Diagnosis not present

## 2020-10-23 DIAGNOSIS — R159 Full incontinence of feces: Secondary | ICD-10-CM | POA: Diagnosis not present

## 2020-11-14 DIAGNOSIS — E1122 Type 2 diabetes mellitus with diabetic chronic kidney disease: Secondary | ICD-10-CM | POA: Diagnosis not present

## 2020-11-14 DIAGNOSIS — Z794 Long term (current) use of insulin: Secondary | ICD-10-CM | POA: Diagnosis not present

## 2020-11-14 DIAGNOSIS — N1832 Chronic kidney disease, stage 3b: Secondary | ICD-10-CM | POA: Diagnosis not present

## 2020-11-16 NOTE — Progress Notes (Signed)
[facility] Note  11/21/2020     CHIEF COMPLAINT Patient presents for Retina Follow Up   HISTORY OF PRESENT ILLNESS: Tina Briggs is a 75 y.o. female who presents to the clinic today for:  HPI    Retina Follow Up    Patient presents with  Dry AMD.  In both eyes.  Severity is moderate.  Duration of 3.5 months.  I, the attending physician,  performed the HPI with the patient and updated documentation appropriately.          Comments    Patient states vision the same OU.        Last edited by Bernarda Caffey, MD on 11/21/2020  4:39 PM. (History)    Pt states vision is stable, but she had a sharp pain OD this morning, she is taking AREDS and checking on amsler grid at home   Referring physician: Wenda Low, [address] [place],  [place] [address]  HISTORICAL INFORMATION:   Selected notes from the MEDICAL RECORD NUMBER Referred by Dr. Kathrin Penner to eval AMD OD LEE: 10.18.21 Ocular Hx- AMD OD   CURRENT MEDICATIONS: No current outpatient medications on file. (Ophthalmic Drugs)   No current facility-administered medications for this visit. (Ophthalmic Drugs)   Current Outpatient Medications (Other)  Medication Sig  . acetaminophen (TYLENOL) 500 MG tablet Take 1,000 mg by mouth 2 (two) times daily as needed for moderate pain or headache.   Marland Kitchen amLODipine (NORVASC) 10 MG tablet Take 10 mg by mouth daily.  Marland Kitchen aspirin EC 81 MG tablet Take 81 mg by mouth daily.  . cholecalciferol (VITAMIN D) 1000 units tablet Take 1,000 Units by mouth daily.  . CRESTOR 40 MG tablet TAKE 1 TABLET AT BEDTIME (Patient taking differently: Take 40 mg by mouth at bedtime.)  . desloratadine (CLARINEX) 5 MG tablet Take 5 mg by mouth daily.  . empagliflozin (JARDIANCE) 10 MG TABS tablet Take by mouth daily.  . fluticasone (FLONASE) 50 MCG/ACT nasal spray Place 2 sprays into both nostrils daily as needed for allergies.   Marland Kitchen gabapentin (NEURONTIN) 300 MG  capsule Take 300 mg by mouth 4 (four) times daily.  . insulin aspart (NOVOLOG) 100 UNIT/ML injection Inject 18-34 Units into the skin 3 (three) times daily before meals. Sliding scale  . LORazepam (ATIVAN) 0.5 MG tablet Take 0.5 mg by mouth every 8 (eight) hours.  . pantoprazole (PROTONIX) 20 MG tablet Take 20 mg by mouth daily.  Marland Kitchen venlafaxine XR (EFFEXOR-XR) 75 MG 24 hr capsule Take 75 mg by mouth every morning.   . vitamin B-12 (CYANOCOBALAMIN) 1000 MCG tablet Take 1,000 mcg by mouth 3 (three) times a week. Take on Sundays, Wednesdays and Fridays.   No current facility-administered medications for this visit. (Other)      REVIEW OF SYSTEMS: ROS    Positive for: Endocrine, Cardiovascular, Eyes   Negative for: Constitutional, Gastrointestinal, Neurological, Skin, Genitourinary, Musculoskeletal, HENT, Respiratory, Psychiatric, Allergic/Imm, Heme/Lymph   Last edited by Roselee Nova D, COT on 11/21/2020  1:21 PM. (History)       ALLERGIES Allergies  Allergen Reactions  . Codeine Shortness Of Breath  . Amoxicillin   . Flagyl [Metronidazole]   . Prevacid [Lansoprazole]   . Tetracyclines & Related Itching  . Trulicity [Dulaglutide]   . Azithromycin Itching and Rash  . Erythromycin Itching and Rash  . Morphine And Related Itching and Rash  . Penicillins Itching and Rash    Has  patient had a PCN reaction causing immediate rash, facial/tongue/throat swelling, SOB or lightheadedness with hypotension: Yes Has patient had a PCN reaction causing severe rash involving mucus membranes or skin necrosis: No Has patient had a PCN reaction that required hospitalization: No Has patient had a PCN reaction occurring within the last 10 years: No If all of the above answers are "NO", then may proceed with Cephalosporin use.   [name] [Sulfamethoxazole-Trimethoprim] Itching and Rash    PAST MEDICAL HISTORY Past Medical History:  Diagnosis Date  . Anemia   . Aspiration pneumonia ([place])    after  common bile duct obstruction  . CATARACT, RIGHT EYE    had repaired  . Depression   . Diverticulitis    recurrent episodes  . DM   . GERD   . 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 Mild to mod TI scarring. (Dr. Lorrene Reid)  . HNP (herniated nucleus pulposus), lumbar    Recurrent  . HYPERLIPIDEMIA-MIXED   . HYPERTENSION, UNSPECIFIED   . OBESITY   . PONV (postoperative nausea and vomiting)    past history only, "sometimes they put a patch on me"   Past Surgical History:  Procedure Laterality Date  . ABDOMINAL HYSTERECTOMY    . APPENDECTOMY    . BACK SURGERY     lumbar '09"microdiscectomy"  . CHOLECYSTECTOMY     open  . COLONOSCOPY WITH PROPOFOL N/A 11/22/2014   Procedure: COLONOSCOPY WITH PROPOFOL;  Surgeon: Garlan Fair, MD;  Location: [facility];  Service: Endoscopy;  Laterality: N/A;  . ERCP N/A 05/26/2015   Procedure: ENDOSCOPIC RETROGRADE CHOLANGIOPANCREATOGRAPHY (ERCP)   (DOING CASE IN MAIN OR);  Surgeon: Teena Irani, MD;  Location: [facility];  Service: Gastroenterology;  Laterality: N/A;  . ERCP N/A 06/06/2015   Procedure: ENDOSCOPIC RETROGRADE CHOLANGIOPANCREATOGRAPHY (ERCP);  Surgeon: Clarene Essex, MD;  Location: [facility];  Service: Endoscopy;  Laterality: N/A;  . ESOPHAGOGASTRODUODENOSCOPY (EGD) WITH PROPOFOL N/A 11/22/2014   Procedure: ESOPHAGOGASTRODUODENOSCOPY (EGD) WITH PROPOFOL;  Surgeon: Garlan Fair, MD;  Location: [facility];  Service: Endoscopy;  Laterality: N/A;  . EYE SURGERY Bilateral    cataracts  . LUMBAR FUSION  09/16/2018  . LUMBAR LAMINECTOMY/DECOMPRESSION MICRODISCECTOMY Left 12/31/2017   Procedure: Laminectomy and Foraminotomy - Lumbar Three-Four - left Extraforaminal diskectomy Lumbar Four-Five left;  Surgeon: Eustace Moore, MD;  Location: [place];  Service: Neurosurgery;  Laterality: Left;  . PERONEAL NERVE DECOMPRESSION Left 12/31/2017   Procedure: Left PERONEAL NERVE DECOMPRESSION;  Surgeon:  Eustace Moore, MD;  Location: [place];  Service: Neurosurgery;  Laterality: Left;  left  . TUBAL LIGATION      FAMILY HISTORY Family History  Problem Relation Age of Onset  . Cancer Maternal Aunt        breast cancer  . Cancer Maternal Grandmother        kidney cancer   . Cancer Paternal Grandmother        bone cancer   . Heart disease Mother   . Alzheimer's disease Father   . Tremor Father        possible PD  . Diabetes Mellitus II Brother   . Healthy Son   . Healthy Son     SOCIAL HISTORY Social History   Tobacco Use  . Smoking status: Never Smoker  . Smokeless tobacco: Never Used  Vaping Use  . Vaping Use: Never used  Substance Use Topics  . Alcohol use: No  . Drug use: No  OPHTHALMIC EXAM:  Base Eye Exam    Visual Acuity (Snellen - Linear)      Right Left   Dist cc 20/25 +1 20/20   Dist ph cc NI    Correction: Glasses       Tonometry (Tonopen, 1:27 PM)      Right Left   Pressure 15 13       Pupils      Dark Light Shape React APD   Right 3 2 Round Brisk None   Left 3 2 Round Brisk None       Visual Fields (Counting fingers)      Left Right    Full Full       Extraocular Movement      Right Left    Full, Ortho Full, Ortho       Neuro/Psych    Oriented x3: Yes   Mood/Affect: Normal       Dilation    Both eyes: 1.0% Mydriacyl, 2.5% Phenylephrine @ 1:27 PM        Slit Lamp and Fundus Exam    Slit Lamp Exam      Right Left   Lids/Lashes Dermatochalasis - upper lid Dermatochalasis - upper lid   Conjunctiva/Sclera White and quiet temporal pinguecula   Cornea Mild arcus, trace Punctate epithelial erosions, well healed temporal cataract wounds Mild arcus, trace, fine Punctate epithelial erosions, well healed temporal cataract wounds   Anterior Chamber Deep and quiet Deep and quiet   Iris Round and moderately dilated Round and moderately dilated   Lens PC IOL in good position with open PC PC IOL in good position with open PC    Vitreous Vitreous syneresis, Posterior vitreous detachment Mild Vitreous syneresis, Posterior vitreous detachment, vitreous condensations       Fundus Exam      Right Left   Disc Pink and Sharp, Compact Pink and Sharp, Compact   C/D Ratio 0.4 0.4   Macula Flat, Blunted foveal reflex, Drusen, RPE mottling and clumping, focal pigment clump IN fovea, No heme or edema Flat, Blunted foveal reflex, Drusen, RPE mottling and clumping, focal pigment clump nasal fovea, No heme or edema   Vessels attenuated, Tortuous attenuated, mild tortuousity, mild AV crossing changes   Periphery Attached, reticular degeneration, No heme  Attached, reticular degeneration, No heme         Refraction    Wearing Rx      Sphere Cylinder Axis Add   Right -0.50 +0.75 116 +2.50   Left +0.25 +1.00 160 +2.50   Type: prog          IMAGING AND PROCEDURES  Imaging and Procedures for 11/21/2020  OCT, Retina - OU - Both Eyes       Right Eye Quality was good. Central Foveal Thickness: 306. Progression has been stable. Findings include normal foveal contour, no IRF, no SRF, retinal drusen  (Focal RPE irregularity IN fovea).   Left Eye Quality was good. Central Foveal Thickness: 297. Progression has been stable. Findings include normal foveal contour, no IRF, no SRF, retinal drusen  (Focal RPE irregularity nasal fovea).   Notes *Images captured and stored on drive  Diagnosis / Impression:  Non-exu ARMD with drusen and focal RPE disruptions OU -- stable from prior  Clinical management:  See below  Abbreviations: NFP - Normal foveal profile. CME - cystoid macular edema. PED - pigment epithelial detachment. IRF - intraretinal fluid. SRF - subretinal fluid. EZ - ellipsoid zone. ERM - epiretinal membrane. ORA -  outer retinal atrophy. ORT - outer retinal tubulation. SRHM - subretinal hyper-reflective material. IRHM - intraretinal hyper-reflective material                 ASSESSMENT/PLAN:    ICD-10-CM   1.  Intermediate stage nonexudative age-related macular degeneration of both eyes  H35.3132   2. Retinal edema  H35.81 OCT, Retina - OU - Both Eyes  3. Essential hypertension  I10   4. Hypertensive retinopathy of both eyes  H35.033   5. Diabetes mellitus type 2 without retinopathy ([place])  E11.9   6. Pseudophakia of both eyes  Z96.1     1,2. Age related macular degeneration, non-exudative, OU  - intermediate stage w/ drusen and focal RPE disruptions OU  - started on AREDS 2 supplements and Amsler Grid monitoring by Dr. Kathrin Penner -- continue  - f/u 5-6 months, DFE, OCT  3,4. Hypertensive retinopathy OU - discussed importance of tight BP control - monitor  5. Diabetes mellitus, type 2 without retinopathy  - A1c was 6.8 on 2.15.22 - The incidence, risk factors for progression, natural history and treatment options for diabetic retinopathy  were discussed with patient.   - The need for close monitoring of blood glucose, blood pressure, and serum lipids, avoiding cigarette or any type of tobacco, and the need for long term follow up was also discussed with patient. - f/u in 1 year, sooner prn  6. Pseudophakia OU  - s/p CE/IOL OU  - IOL in good position, doing well  - monitor    Ophthalmic Meds Ordered this visit:  No orders of the defined types were placed in this encounter.      Return for f/u 5-6 months, non-exu ARMD OU, DFE, OCT.  There are no Patient Instructions on file for this visit.  This document serves as a record of services personally performed by Gardiner Sleeper, MD, PhD. It was created on their behalf by Estill Bakes, COT an ophthalmic technician. The creation of this record is the provider's dictation and/or activities during the visit.    Electronically signed by: Estill Bakes, COT 2.17.22 @ 4:42 PM   This document serves as a record of services personally performed by Gardiner Sleeper, MD, PhD. It was created on their behalf by San Jetty. Owens Shark, OA an ophthalmic  technician. The creation of this record is the provider's dictation and/or activities during the visit.    Electronically signed by: San Jetty. Owens Shark, [place] 02.22.2022 4:42 PM  I have reviewed the above documentation for accuracy and completeness, and I agree with the above. Gardiner Sleeper, M.D., Ph.D. 11/21/20 4:42 PM   Abbreviations: M myopia (nearsighted); A astigmatism; H hyperopia (farsighted); P presbyopia; Mrx spectacle prescription;  CTL contact lenses; OD right eye; OS left eye; OU both eyes  XT exotropia; ET esotropia; PEK punctate epithelial keratitis; PEE punctate epithelial erosions; DES dry eye syndrome; MGD meibomian gland dysfunction; ATs artificial tears; PFAT's preservative free artificial tears; [place] nuclear sclerotic cataract; PSC posterior subcapsular cataract; ERM epi-retinal membrane; PVD posterior vitreous detachment; RD retinal detachment; DM diabetes mellitus; DR diabetic retinopathy; NPDR non-proliferative diabetic retinopathy; PDR proliferative diabetic retinopathy; CSME clinically significant macular edema; DME diabetic macular edema; dbh dot blot hemorrhages; CWS cotton wool spot; POAG primary open angle glaucoma; C/D cup-to-disc ratio; HVF humphrey visual field; GVF goldmann visual field; OCT optical coherence tomography; IOP intraocular pressure; BRVO Branch retinal vein occlusion; CRVO central retinal vein occlusion; CRAO central retinal artery occlusion; BRAO branch retinal artery occlusion;  RT retinal tear; SB scleral buckle; PPV pars plana vitrectomy; VH Vitreous hemorrhage; PRP panretinal laser photocoagulation; IVK intravitreal kenalog; VMT vitreomacular traction; MH Macular hole;  NVD neovascularization of the disc; NVE neovascularization elsewhere; AREDS age related eye disease study; ARMD age related macular degeneration; POAG primary open angle glaucoma; EBMD epithelial/anterior basement membrane dystrophy; ACIOL anterior chamber intraocular lens; IOL intraocular lens;  PCIOL posterior chamber intraocular lens; Phaco/IOL phacoemulsification with intraocular lens placement; [place] photorefractive keratectomy; LASIK laser assisted in situ keratomileusis; HTN hypertension; DM diabetes mellitus; COPD chronic obstructive pulmonary disease

## 2020-11-20 DIAGNOSIS — E119 Type 2 diabetes mellitus without complications: Secondary | ICD-10-CM | POA: Diagnosis not present

## 2020-11-21 ENCOUNTER — Other Ambulatory Visit: Payer: Self-pay

## 2020-11-21 ENCOUNTER — Encounter (INDEPENDENT_AMBULATORY_CARE_PROVIDER_SITE_OTHER): Payer: Self-pay | Admitting: Ophthalmology

## 2020-11-21 ENCOUNTER — Ambulatory Visit (INDEPENDENT_AMBULATORY_CARE_PROVIDER_SITE_OTHER): Payer: PPO | Admitting: Ophthalmology

## 2020-11-21 DIAGNOSIS — H3581 Retinal edema: Secondary | ICD-10-CM | POA: Diagnosis not present

## 2020-11-21 DIAGNOSIS — E119 Type 2 diabetes mellitus without complications: Secondary | ICD-10-CM | POA: Diagnosis not present

## 2020-11-21 DIAGNOSIS — H35033 Hypertensive retinopathy, bilateral: Secondary | ICD-10-CM

## 2020-11-21 DIAGNOSIS — I10 Essential (primary) hypertension: Secondary | ICD-10-CM | POA: Diagnosis not present

## 2020-11-21 DIAGNOSIS — Z961 Presence of intraocular lens: Secondary | ICD-10-CM

## 2020-11-21 DIAGNOSIS — H353132 Nonexudative age-related macular degeneration, bilateral, intermediate dry stage: Secondary | ICD-10-CM | POA: Diagnosis not present

## 2020-11-23 ENCOUNTER — Ambulatory Visit (INDEPENDENT_AMBULATORY_CARE_PROVIDER_SITE_OTHER): Payer: PPO

## 2020-11-23 ENCOUNTER — Encounter: Payer: Self-pay | Admitting: Podiatrist

## 2020-11-23 ENCOUNTER — Ambulatory Visit: Payer: PPO | Admitting: Podiatrist

## 2020-11-23 ENCOUNTER — Other Ambulatory Visit: Payer: Self-pay

## 2020-11-23 ENCOUNTER — Other Ambulatory Visit: Payer: Self-pay | Admitting: Podiatrist

## 2020-11-23 DIAGNOSIS — Z860100 Personal history of colon polyps, unspecified: Secondary | ICD-10-CM | POA: Insufficient documentation

## 2020-11-23 DIAGNOSIS — G629 Polyneuropathy, unspecified: Secondary | ICD-10-CM | POA: Diagnosis not present

## 2020-11-23 DIAGNOSIS — S9422XS Injury of deep peroneal nerve at ankle and foot level, left leg, sequela: Secondary | ICD-10-CM

## 2020-11-23 DIAGNOSIS — F419 Anxiety disorder, unspecified: Secondary | ICD-10-CM | POA: Insufficient documentation

## 2020-11-23 DIAGNOSIS — K5732 Diverticulitis of large intestine without perforation or abscess without bleeding: Secondary | ICD-10-CM | POA: Insufficient documentation

## 2020-11-23 DIAGNOSIS — D509 Iron deficiency anemia, unspecified: Secondary | ICD-10-CM | POA: Insufficient documentation

## 2020-11-23 DIAGNOSIS — E539 Vitamin B deficiency, unspecified: Secondary | ICD-10-CM | POA: Insufficient documentation

## 2020-11-23 DIAGNOSIS — Z8639 Personal history of other endocrine, nutritional and metabolic disease: Secondary | ICD-10-CM | POA: Insufficient documentation

## 2020-11-23 DIAGNOSIS — N052 Unspecified nephritic syndrome with diffuse membranous glomerulonephritis: Secondary | ICD-10-CM | POA: Insufficient documentation

## 2020-11-23 DIAGNOSIS — D131 Benign neoplasm of stomach: Secondary | ICD-10-CM | POA: Insufficient documentation

## 2020-11-23 DIAGNOSIS — F325 Major depressive disorder, single episode, in full remission: Secondary | ICD-10-CM | POA: Insufficient documentation

## 2020-11-23 DIAGNOSIS — M79609 Pain in unspecified limb: Secondary | ICD-10-CM | POA: Insufficient documentation

## 2020-11-23 DIAGNOSIS — R1032 Left lower quadrant pain: Secondary | ICD-10-CM | POA: Insufficient documentation

## 2020-11-23 DIAGNOSIS — K8020 Calculus of gallbladder without cholecystitis without obstruction: Secondary | ICD-10-CM | POA: Insufficient documentation

## 2020-11-23 DIAGNOSIS — Z8719 Personal history of other diseases of the digestive system: Secondary | ICD-10-CM | POA: Insufficient documentation

## 2020-11-23 DIAGNOSIS — R600 Localized edema: Secondary | ICD-10-CM | POA: Insufficient documentation

## 2020-11-23 DIAGNOSIS — M792 Neuralgia and neuritis, unspecified: Secondary | ICD-10-CM | POA: Diagnosis not present

## 2020-11-23 DIAGNOSIS — R3129 Other microscopic hematuria: Secondary | ICD-10-CM | POA: Insufficient documentation

## 2020-11-23 DIAGNOSIS — J309 Allergic rhinitis, unspecified: Secondary | ICD-10-CM | POA: Insufficient documentation

## 2020-11-23 DIAGNOSIS — K760 Fatty (change of) liver, not elsewhere classified: Secondary | ICD-10-CM | POA: Insufficient documentation

## 2020-11-23 DIAGNOSIS — K591 Functional diarrhea: Secondary | ICD-10-CM | POA: Insufficient documentation

## 2020-11-23 DIAGNOSIS — M5430 Sciatica, unspecified side: Secondary | ICD-10-CM | POA: Insufficient documentation

## 2020-11-23 DIAGNOSIS — M519 Unspecified thoracic, thoracolumbar and lumbosacral intervertebral disc disorder: Secondary | ICD-10-CM | POA: Insufficient documentation

## 2020-11-23 DIAGNOSIS — R159 Full incontinence of feces: Secondary | ICD-10-CM | POA: Insufficient documentation

## 2020-11-23 DIAGNOSIS — M1990 Unspecified osteoarthritis, unspecified site: Secondary | ICD-10-CM | POA: Insufficient documentation

## 2020-11-23 DIAGNOSIS — Z794 Long term (current) use of insulin: Secondary | ICD-10-CM | POA: Insufficient documentation

## 2020-11-23 DIAGNOSIS — G250 Essential tremor: Secondary | ICD-10-CM | POA: Insufficient documentation

## 2020-11-23 DIAGNOSIS — I82402 Acute embolism and thrombosis of unspecified deep veins of left lower extremity: Secondary | ICD-10-CM | POA: Insufficient documentation

## 2020-11-23 DIAGNOSIS — K838 Other specified diseases of biliary tract: Secondary | ICD-10-CM | POA: Insufficient documentation

## 2020-11-23 DIAGNOSIS — E1121 Type 2 diabetes mellitus with diabetic nephropathy: Secondary | ICD-10-CM | POA: Insufficient documentation

## 2020-11-23 DIAGNOSIS — I700 Atherosclerosis of aorta: Secondary | ICD-10-CM | POA: Insufficient documentation

## 2020-11-23 NOTE — Patient Instructions (Signed)
I will make referrals for you for vascular to test your circulation to your left foot and big toe as well as a referral for a consultation to your neurologist.   We can consider physical therapy after the consultations based on their findings as well.

## 2020-11-27 DIAGNOSIS — Z961 Presence of intraocular lens: Secondary | ICD-10-CM | POA: Diagnosis not present

## 2020-11-27 DIAGNOSIS — H353132 Nonexudative age-related macular degeneration, bilateral, intermediate dry stage: Secondary | ICD-10-CM | POA: Diagnosis not present

## 2020-11-27 DIAGNOSIS — E119 Type 2 diabetes mellitus without complications: Secondary | ICD-10-CM | POA: Diagnosis not present

## 2020-11-27 NOTE — Progress Notes (Signed)
Chief Complaint  Patient presents with  . Toe Pain    Lt hallux toe pain x 3 yrs; started after habing nerve sx on Lt leg; 8/10 sharp pains -feels like no circulation -w/ cold feeling, numbness no tinlgin tx: stretching   . Diabetes    FBS: 133 a1C: na      HPI: Patient is 75 y.o. female who presents today for the concerns as listed above. She relates she had a decompression to the deep peroneal nerve about 3 years ago and developed pain on the left great toe thereafter.  She relates sharp pain especially at night when sleeping. She relates a cold feeling to the toe. She has tried stretching with no permanent relief.    Patient Active Problem List   Diagnosis Date Noted  . Acute embolism and thrombosis of unspecified deep veins of left lower extremity ([place]) 11/23/2020  . Allergic rhinitis 11/23/2020  . Anxiety 11/23/2020  . Benign neoplasm of stomach 11/23/2020  . Cholangiectasis 11/23/2020  . Cholelithiasis without obstruction 11/23/2020  . Complete fecal incontinence 11/23/2020  . Diabetic renal disease ([place]) 11/23/2020  . Diverticulitis of colon 11/23/2020  . Edema of lower extremity 11/23/2020  . Essential tremor 11/23/2020  . Fatty liver 11/23/2020  . Functional diarrhea 11/23/2020  . Hardening of the aorta (main artery of the heart) ([place]) 11/23/2020  . History of diverticulitis 11/23/2020  . History of iron deficiency 11/23/2020  . Hypomagnesemia 11/23/2020  . Iron deficiency anemia 11/23/2020  . Left lower quadrant pain 11/23/2020  . Long term (current) use of insulin ([place]) 11/23/2020  . Major depression in complete remission ([place]) 11/23/2020  . 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  . Microscopic hematuria 11/23/2020  . Osteoarthritis 11/23/2020  . Pain in limb 11/23/2020  . Personal history of colonic polyps 11/23/2020  . Sciatica 11/23/2020  . Unspecified thoracic, thoracolumbar and lumbosacral intervertebral disc disorder 11/23/2020  . Vitamin B deficiency  11/23/2020  . Low back pain 06/01/2019  . Inflammation of sacroiliac joint ([place]) 02/09/2019  . Diverticulitis 10/15/2018  . S/P lumbar fusion 09/16/2018  . Prolapsed lumbar disc 08/31/2018  . Pain in left foot 06/06/2018  . S/P lumbar laminectomy 12/31/2017  . Lumbar radiculopathy 12/09/2017  . 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 Foot-drop 12/08/2017  . Glomerulonephritis 09/30/2017  . Elevated serum immunoglobulin free light chain level 08/26/2017  . Chronic kidney disease, stage 3 ([place]) 06/07/2015  . Choledocholithiasis 06/07/2015  . Aspiration pneumonitis ([place]) 06/06/2015  . Anemia 06/06/2015  . Diabetes mellitus type 2 in obese ([place]) 05/25/2015  . Elevated liver enzymes 05/24/2015  . RUQ abdominal pain 05/24/2015  . Hypercholesterolemia 10/27/2014  . Obesity 02/15/2009  . CATARACT, RIGHT EYE 02/15/2009  . Essential hypertension 02/15/2009  . GERD 02/15/2009    Current Outpatient Medications on File Prior to Visit  Medication Sig Dispense Refill  . acetaminophen (TYLENOL) 500 MG tablet Take 1,000 mg by mouth 2 (two) times daily as needed for moderate pain or headache.     Marland Kitchen amLODipine (NORVASC) 10 MG tablet Take 10 mg by mouth daily.    Marland Kitchen aspirin EC 81 MG tablet Take 81 mg by mouth daily.    . cholecalciferol (VITAMIN D) 1000 units tablet Take 1,000 Units by mouth daily.    . CRESTOR 40 MG tablet TAKE 1 TABLET AT BEDTIME (Patient taking differently: Take 40 mg by mouth at bedtime.) 90 tablet 1  . desloratadine (CLARINEX) 5 MG tablet Take 5 mg by mouth daily.    Marland Kitchen  empagliflozin (JARDIANCE) 10 MG TABS tablet Take by mouth daily.    . fluticasone (FLONASE) 50 MCG/ACT nasal spray Place 2 sprays into both nostrils daily as needed for allergies.     Marland Kitchen gabapentin (NEURONTIN) 300 MG capsule Take 300 mg by mouth 4 (four) times daily.    . insulin aspart (NOVOLOG) 100 UNIT/ML injection Inject 18-34 Units into the skin 3 (three) times daily before meals. Sliding  scale    . LORazepam (ATIVAN) 0.5 MG tablet Take 0.5 mg by mouth every 8 (eight) hours.    . pantoprazole (PROTONIX) 20 MG tablet Take 20 mg by mouth daily.    Marland Kitchen venlafaxine XR (EFFEXOR-XR) 75 MG 24 hr capsule Take 75 mg by mouth every morning.     . vitamin B-12 (CYANOCOBALAMIN) 1000 MCG tablet Take 1,000 mcg by mouth 3 (three) times a week. Take on Sundays, Wednesdays and Fridays.     No current facility-administered medications on file prior to visit.    Allergies  Allergen Reactions  . Codeine Shortness Of Breath  . Amoxicillin Other (See Comments)  . Clavulanic Acid   . Flagyl [Metronidazole]   . Other Other (See Comments)  . Prevacid [Lansoprazole]   . Tetracyclines & Related Itching  . Trulicity [Dulaglutide]   . Azithromycin Itching and Rash  . Erythromycin Itching and Rash  . Morphine And Related Itching and Rash  . Penicillins Itching and Rash    Has patient had a PCN reaction causing immediate rash, facial/tongue/throat swelling, SOB or lightheadedness with hypotension: Yes Has patient had a PCN reaction causing severe rash involving mucus membranes or skin necrosis: No Has patient had a PCN reaction that required hospitalization: No Has patient had a PCN reaction occurring within the last 10 years: No If all of the above answers are "NO", then may proceed with Cephalosporin use.   [name] [Sulfamethoxazole-Trimethoprim] Itching and Rash    Review of Systems No fevers, chills, nausea, muscle aches, no difficulty breathing, no calf pain, no chest pain or shortness of breath.   Physical Exam  GENERAL APPEARANCE: Alert, conversant. Appropriately groomed. No acute distress.   VASCULAR: Pedal pulses palpable DP and PT bilateral.  Capillary refill time is immediate to all digits,  Proximal to distal cooling it warm to warm.  Digital perfusion adequate. Left hallux perfusion appears normal-  Capillary fill time is normal.    NEUROLOGIC: sensation is intact to 5.07  monofilament at 5/5 sites bilateral.  Light touch is decreased to the left hallux but is otherwise intact.  Vibratory sensation is intact bilateral first metatarsal heads.   MUSCULOSKELETAL: acceptable muscle strength, tone and stability bilateral.  No gross boney pedal deformities noted.  No pain, crepitus or limitation noted with foot and ankle range of motion bilateral.   DERMATOLOGIC: skin is warm, supple, and dry.  No open lesions noted.  No rash, no pre ulcerative lesions. Digital nails are asymptomatic.    Radiographic exam:  Normal osseous mineralization.  No fracture or dislocation or acute osseous abnormalities present.  Joint spaces are normal. Tailors bunion present left, mild heel spur present, mild arthritic changes noted to the midfoot.     Assessment     ICD-10-CM   1. 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 Nerve pain  M79.2 Ambulatory referral to Neurology     Plan  Examination findings were discussed with the patient.  From a podiatric perspective,  There is  no sign of vascular compromise that would cause the cold feeling and cramping to the toe.  I suspect the discomfort is related to the prior release of the peroneal nerve.  Possible scar tissue or pressure on the deep peroneal nerve may have resulted?  I recommended she see her neurologist for further evaluation.  I also discussed physical therapy could be beneficial for her. She would like to hold off on PT until after she sees her neurologist.  I will send in a referral just in case one is needed.

## 2020-11-30 ENCOUNTER — Other Ambulatory Visit: Payer: Self-pay | Admitting: Podiatrist

## 2020-11-30 DIAGNOSIS — S9422XS Injury of deep peroneal nerve at ankle and foot level, left leg, sequela: Secondary | ICD-10-CM

## 2020-11-30 DIAGNOSIS — G629 Polyneuropathy, unspecified: Secondary | ICD-10-CM

## 2020-12-08 DIAGNOSIS — I10 Essential (primary) hypertension: Secondary | ICD-10-CM | POA: Diagnosis not present

## 2020-12-08 DIAGNOSIS — E1122 Type 2 diabetes mellitus with diabetic chronic kidney disease: Secondary | ICD-10-CM | POA: Diagnosis not present

## 2020-12-08 DIAGNOSIS — E782 Mixed hyperlipidemia: Secondary | ICD-10-CM | POA: Diagnosis not present

## 2020-12-08 DIAGNOSIS — F322 Major depressive disorder, single episode, severe without psychotic features: Secondary | ICD-10-CM | POA: Diagnosis not present

## 2020-12-08 DIAGNOSIS — G629 Polyneuropathy, unspecified: Secondary | ICD-10-CM | POA: Diagnosis not present

## 2020-12-08 DIAGNOSIS — M519 Unspecified thoracic, thoracolumbar and lumbosacral intervertebral disc disorder: Secondary | ICD-10-CM | POA: Diagnosis not present

## 2020-12-08 DIAGNOSIS — E538 Deficiency of other specified B group vitamins: Secondary | ICD-10-CM | POA: Diagnosis not present

## 2020-12-08 DIAGNOSIS — I700 Atherosclerosis of aorta: Secondary | ICD-10-CM | POA: Diagnosis not present

## 2020-12-08 DIAGNOSIS — N1831 Chronic kidney disease, stage 3a: Secondary | ICD-10-CM | POA: Diagnosis not present

## 2020-12-20 DIAGNOSIS — E119 Type 2 diabetes mellitus without complications: Secondary | ICD-10-CM | POA: Diagnosis not present

## 2020-12-22 ENCOUNTER — Telehealth: Payer: Self-pay | Admitting: Podiatrist

## 2020-12-22 NOTE — Telephone Encounter (Signed)
Patient calling to get an update on the vein text that was ordered for her. Says she has not heard back from anyone about the test or when she should get scheduled. Please advise

## 2020-12-25 NOTE — Telephone Encounter (Signed)
Hi Taylor!   Would you mind letting Ms. Wisniewski know that I made a referral to her neurologist (Dr. Carles Collet) to see if she/ or someone in her office could look at the pain she is experiencing as it appeared to be a nerve pain-  She did not have vein symptoms (swelling, bulging veins, skin discoloration) therefore I did not make a referral for vein testing.   It looks like they have an appointment scheduled for her in the system (although I don't know the date or time)  I would advise her to call and ask her neurologist office if she hasn't heard from them yet.    Thanks so much!  Dr. Johnette Abraham

## 2020-12-28 ENCOUNTER — Ambulatory Visit: Payer: PPO | Admitting: Neurology

## 2021-01-01 ENCOUNTER — Other Ambulatory Visit: Payer: Self-pay

## 2021-01-01 ENCOUNTER — Encounter: Payer: Self-pay | Admitting: Neurology

## 2021-01-01 ENCOUNTER — Ambulatory Visit: Payer: PPO | Admitting: Neurology

## 2021-01-01 VITALS — BP 118/68 | HR 76 | Ht 63.0 in | Wt 182.0 lb

## 2021-01-01 DIAGNOSIS — M79672 Pain in left foot: Secondary | ICD-10-CM | POA: Diagnosis not present

## 2021-01-01 DIAGNOSIS — M4807 Spinal stenosis, lumbosacral region: Secondary | ICD-10-CM

## 2021-01-01 DIAGNOSIS — R292 Abnormal reflex: Secondary | ICD-10-CM

## 2021-01-01 DIAGNOSIS — R202 Paresthesia of skin: Secondary | ICD-10-CM | POA: Diagnosis not present

## 2021-01-01 DIAGNOSIS — Z9889 Other specified postprocedural states: Secondary | ICD-10-CM

## 2021-01-01 NOTE — Patient Instructions (Signed)
MRI lumbar spine without contrast  Nerve testing of the left > right leg.    You may try lidocaine ointment to your left toe to numb the pain (Aspercream, Salonpas)    ELECTROMYOGRAM AND NERVE CONDUCTION STUDIES (EMG/NCS) INSTRUCTIONS  How to Prepare The neurologist conducting the EMG will need to know if you have certain medical conditions. Tell the neurologist and other EMG lab personnel if you: . Have a pacemaker or any other electrical medical device . Take blood-thinning medications . Have hemophilia, a blood-clotting disorder that causes prolonged bleeding Bathing Take a shower or bath shortly before your exam in order to remove oils from your skin. Don't apply lotions or creams before the exam.  What to Expect You'll likely be asked to change into a hospital gown for the procedure and lie down on an examination table. The following explanations can help you understand what will happen during the exam.  . Electrodes. The neurologist or a technician places surface electrodes at various locations on your skin depending on where you're experiencing symptoms. Or the neurologist may insert needle electrodes at different sites depending on your symptoms.  . Sensations. The electrodes will at times transmit a tiny electrical current that you may feel as a twinge or spasm. The needle electrode may cause discomfort or pain that usually ends shortly after the needle is removed. If you are concerned about discomfort or pain, you may want to talk to the neurologist about taking a short break during the exam.  . Instructions. During the needle EMG, the neurologist will assess whether there is any spontaneous electrical activity when the muscle is at rest - activity that isn't present in healthy muscle tissue - and the degree of activity when you slightly contract the muscle.  He or she will give you instructions on resting and contracting a muscle at appropriate times. Depending on what muscles and  nerves the neurologist is examining, he or she may ask you to change positions during the exam.  After your EMG You may experience some temporary, minor bruising where the needle electrode was inserted into your muscle. This bruising should fade within several days. If it persists, contact your primary care doctor.

## 2021-01-01 NOTE — Progress Notes (Signed)
[facility] Clinic Note - Initial Visit   Date: 01/01/21  Tina Briggs MRN: [id number] DOB: 03-01-1946   Dear Dr. Lysle Rubens:  Thank you for your kind referral of Tina Briggs for consultation of left foot pain. Although her history is well known to you, please allow [place] to reiterate it for the purpose of our medical record. The patient was accompanied to the clinic by self.    History of Present Illness: Tina Briggs is a 75 y.o. right-handed female with PLIF at L4-5 and left peroneal nerve decompression (2019), diabetes mellitus, hypertension, and hyperlipidemia presenting for evaluation of left foot pain. She had left peroneal nerve decompression and lumbar PLIF at L4-5 for left foot drop in 2019.  Her foot drop improved following surgery. A few weeks after her surgery, she developed hypersensitivity over the lower leg ankle.  She also has numbness over the medial foot.  Over the past few months, she began having sharp pain over the left great toe.  She has noticed that sleeping on her back tends to make the pain in the toe worse, and is resolved with repositioning.  She denies any symptoms in the right leg.  She denies weakness.    She had EMG of the left leg twice which was normal.  She had not had MRI lumbar spine.    She was evaluated here by Dr. Carles Collet for tremor in 2020.  Out-side paper records, electronic medical record, and images have been reviewed where available and summarized as:  Lab Results  Component Value Date   HGBA1C 6.8 (H) 09/09/2018   Lab Results  Component Value Date   VITAMINB12 1,114 (H) 06/03/2015   MRI lumbar spine 09/19/2017:  The patient's LEFT-sided symptoms likely derive from residual/recurrent disc pathology at L3-4, were congenitally short pedicles, posterior element hypertrophy, and central and leftward protrusion which narrows the spinal canal, LEFT greater than RIGHT subarticular zone, and LEFT  foramen, with mass effect on the LEFT L3 root in the extraforaminal compartment. See discussion above.  Mild moderate stenosis at L4-5, both congenital and acquired. Correlate clinically for L5 nerve root symptoms.    Past Medical History:  Diagnosis Date  . Anemia   . Aspiration pneumonia ([place])    after common bile duct obstruction  . CATARACT, RIGHT EYE    had repaired  . Depression   . Diverticulitis    recurrent episodes  . DM   . GERD   . 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 Mild to mod TI scarring. (Dr. Lorrene Reid)  . HNP (herniated nucleus pulposus), lumbar    Recurrent  . HYPERLIPIDEMIA-MIXED   . HYPERTENSION, UNSPECIFIED   . OBESITY   . PONV (postoperative nausea and vomiting)    past history only, "sometimes they put a patch on me"    Past Surgical History:  Procedure Laterality Date  . ABDOMINAL HYSTERECTOMY    . APPENDECTOMY    . BACK SURGERY     lumbar '09"microdiscectomy"  . CHOLECYSTECTOMY     open  . COLONOSCOPY WITH PROPOFOL N/A 11/22/2014   Procedure: COLONOSCOPY WITH PROPOFOL;  Surgeon: Garlan Fair, MD;  Location: [facility];  Service: Endoscopy;  Laterality: N/A;  . ERCP N/A 05/26/2015   Procedure: ENDOSCOPIC RETROGRADE CHOLANGIOPANCREATOGRAPHY (ERCP)   (DOING CASE IN MAIN OR);  Surgeon: Teena Irani, MD;  Location: [facility];  Service: Gastroenterology;  Laterality: N/A;  . ERCP N/A 06/06/2015   Procedure: ENDOSCOPIC RETROGRADE  CHOLANGIOPANCREATOGRAPHY (ERCP);  Surgeon: Clarene Essex, MD;  Location: [facility];  Service: Endoscopy;  Laterality: N/A;  . ESOPHAGOGASTRODUODENOSCOPY (EGD) WITH PROPOFOL N/A 11/22/2014   Procedure: ESOPHAGOGASTRODUODENOSCOPY (EGD) WITH PROPOFOL;  Surgeon: Garlan Fair, MD;  Location: [facility];  Service: Endoscopy;  Laterality: N/A;  . EYE SURGERY Bilateral    cataracts  . LUMBAR FUSION  09/16/2018  . LUMBAR LAMINECTOMY/DECOMPRESSION MICRODISCECTOMY Left  12/31/2017   Procedure: Laminectomy and Foraminotomy - Lumbar Three-Four - left Extraforaminal diskectomy Lumbar Four-Five left;  Surgeon: Eustace Moore, MD;  Location: [place];  Service: Neurosurgery;  Laterality: Left;  . PERONEAL NERVE DECOMPRESSION Left 12/31/2017   Procedure: Left PERONEAL NERVE DECOMPRESSION;  Surgeon: Eustace Moore, MD;  Location: [place];  Service: Neurosurgery;  Laterality: Left;  left  . TUBAL LIGATION       Medications:  Outpatient Encounter Medications as of 01/01/2021  Medication Sig Note  . acetaminophen (TYLENOL) 500 MG tablet Take 1,000 mg by mouth 2 (two) times daily as needed for moderate pain or headache.    Marland Kitchen aspirin EC 81 MG tablet Take 81 mg by mouth daily.   . cholecalciferol (VITAMIN D) 1000 units tablet Take 1,000 Units by mouth daily.   . CRESTOR 40 MG tablet TAKE 1 TABLET AT BEDTIME (Patient taking differently: Take 40 mg by mouth at bedtime.)   . desloratadine (CLARINEX) 5 MG tablet Take 5 mg by mouth daily.   . empagliflozin (JARDIANCE) 10 MG TABS tablet Take by mouth daily.   . fluticasone (FLONASE) 50 MCG/ACT nasal spray Place 2 sprays into both nostrils daily as needed for allergies.    Marland Kitchen gabapentin (NEURONTIN) 300 MG capsule Take 300 mg by mouth 4 (four) times daily.   . insulin aspart (NOVOLOG) 100 UNIT/ML injection Inject 18-34 Units into the skin 3 (three) times daily before meals. Sliding scale 12-18 per patient 10/15/2018: Took 18 units B/S 174  . LORazepam (ATIVAN) 0.5 MG tablet Take 0.5 mg by mouth every 8 (eight) hours.   Marland Kitchen losartan (COZAAR) 25 MG tablet Take 25 mg by mouth 2 (two) times daily.   . pantoprazole (PROTONIX) 20 MG tablet Take 20 mg by mouth daily.   Marland Kitchen venlafaxine XR (EFFEXOR-XR) 75 MG 24 hr capsule Take 75 mg by mouth every morning.    . vitamin B-12 (CYANOCOBALAMIN) 1000 MCG tablet Take 1,000 mcg by mouth 3 (three) times a week. Take on Sundays, Wednesdays and Fridays.   Marland Kitchen amLODipine (NORVASC) 10 MG tablet Take 10 mg by mouth  daily. (Patient not taking: Reported on 01/01/2021)    No facility-administered encounter medications on file as of 01/01/2021.    Allergies:  Allergies  Allergen Reactions  . Codeine Shortness Of Breath  . Amoxicillin Other (See Comments)  . Clavulanic Acid   . Flagyl [Metronidazole]   . Other Other (See Comments)  . Prevacid [Lansoprazole]   . Tetracyclines & Related Itching  . Trulicity [Dulaglutide]   . Azithromycin Itching and Rash  . Erythromycin Itching and Rash  . Morphine And Related Itching and Rash  . Penicillins Itching and Rash    Has patient had a PCN reaction causing immediate rash, facial/tongue/throat swelling, SOB or lightheadedness with hypotension: Yes Has patient had a PCN reaction causing severe rash involving mucus membranes or skin necrosis: No Has patient had a PCN reaction that required hospitalization: No Has patient had a PCN reaction occurring within the last 10 years: No If all of the above answers are "NO", then  may proceed with Cephalosporin use.   [name] [Sulfamethoxazole-Trimethoprim] Itching and Rash    Family History: Family History  Problem Relation Age of Onset  . Cancer Maternal Aunt        breast cancer  . Cancer Maternal Grandmother        kidney cancer   . Cancer Paternal Grandmother        bone cancer   . Heart disease Mother   . Alzheimer's disease Father   . Tremor Father        possible PD  . Diabetes Mellitus II Brother   . Healthy Son   . Healthy Son     Social History: Social History   Tobacco Use  . Smoking status: Never Smoker  . Smokeless tobacco: Never Used  Vaping Use  . Vaping Use: Never used  Substance Use Topics  . Alcohol use: No  . Drug use: No   Social History   Social History Narrative   Right Handed    Lives in a one story home    Drinks Little Caffeine     Vital Signs:  BP 118/68   Pulse 76   Ht 5\' 3"  (1.6 m)   Wt 182 lb (82.6 kg)   SpO2 98%   BMI 32.24 kg/m   Neurological  Exam: MENTAL STATUS including orientation to time, place, person, recent and remote memory, attention span and concentration, language, and fund of knowledge is normal.  Speech is not dysarthric.  CRANIAL NERVES: II:  No visual field defects.     III-IV-VI: Pupils equal round and reactive to light.  Normal conjugate, extra-ocular eye movements in all directions of gaze.  No nystagmus.  No ptosis.   V:  Normal facial sensation.    VII:  Normal facial symmetry and movements.   VIII:  Normal hearing and vestibular function.   IX-X:  Normal palatal movement.   XI:  Normal shoulder shrug and head rotation.   XII:  Normal tongue strength and range of motion, no deviation or fasciculation.  MOTOR:  No atrophy, fasciculations or abnormal movements.  No pronator drift.   Upper Extremity:  Right  Left  Deltoid  5/5   5/5   Biceps  5/5   5/5   Triceps  5/5   5/5   Infraspinatus 5/5  5/5  Medial pectoralis 5/5  5/5  Wrist extensors  5/5   5/5   Wrist flexors  5/5   5/5   Finger extensors  5/5   5/5   Finger flexors  5/5   5/5   Dorsal interossei  5/5   5/5   Abductor pollicis  5/5   5/5   Tone (Ashworth scale)  0  0   Lower Extremity:  Right  Left  Hip flexors  5/5   5/5   Hip extensors  5/5   5/5   Adductor 5/5  5/5  Abductor 5/5  5/5  Knee flexors  5/5   5/5   Knee extensors  5/5   5/5   Dorsiflexors  5/5   5/5   Plantarflexors  5/5   5/5   Toe extensors  5/5   5/5   Toe flexors  5/5   5/5   Tone (Ashworth scale)  0  0   MSRs:  Right        Left                  brachioradialis 3+  3+  biceps 3+  3+  triceps 3+  3+  patellar 3+  3+  ankle jerk 2+  2+  Hoffman no  no  plantar response down  down   SENSORY:  Hyperesthesia with temperature and light tough over the anterior lower leg and dorsum of the foot.  Reduced sensation over the medial foot.  Sensation is normal in the right leg.  Rhomberg's sign is mildly positive.    COORDINATION/GAIT: Normal finger-to- nose-finger.   Intact rapid alternating movements bilaterally.  Able to rise from a chair without using arms.  Gait narrow based and stable. Unable to walk on left heel, toe walking intact.  Unsteady with tandem gait.   IMPRESSION: Left foot paresthesias, following PLIF L4-5 and left peroneal decompression.  Symptoms were not present prior to surgery or immediately following surgery and developed weeks later.  Need to evaluate for sensory L5 radiculopathy vs superficial peroneal neuropathy.    PLAN/RECOMMENDATIONS:  MRI lumbar spine without contrast to evaluate for compressive pathology given brisk reflexes NCS/EMG of the left > right Continue gabapentin 300mg  three times daily OK to use lidocaine ointment to left great toe as needed for pain  Further recommendations pending results.    Thank you for allowing me to participate in patient's care.  If I can answer any additional questions, I would be pleased to do so.    Sincerely,    Engelbert Sevin K. Posey Pronto, DO

## 2021-01-04 ENCOUNTER — Other Ambulatory Visit: Payer: Self-pay

## 2021-01-04 ENCOUNTER — Ambulatory Visit: Payer: PPO | Admitting: Podiatry

## 2021-01-04 ENCOUNTER — Encounter: Payer: Self-pay | Admitting: Podiatry

## 2021-01-04 DIAGNOSIS — M792 Neuralgia and neuritis, unspecified: Secondary | ICD-10-CM

## 2021-01-04 DIAGNOSIS — S9422XS Injury of deep peroneal nerve at ankle and foot level, left leg, sequela: Secondary | ICD-10-CM | POA: Diagnosis not present

## 2021-01-04 NOTE — Progress Notes (Signed)
  Subjective:  Patient ID: Tina Briggs, female    DOB: Feb 06, 1946,  MRN: [id number]  Chief Complaint  Patient presents with  . Foot Pain    Nerve recheck     75 y.o. female presents with the above complaint. History confirmed with patient.  She has history of lumbar spine issues as well as prior foot drop that was treated with deep peroneal nerve release which improved the foot drop.  She has developed pain around the first metatarsophalangeal joint on the left side, this is a sharp stabbing pain, there is also a lot of numbness.  This affects her balance and walking.  Objective:  Physical Exam: warm, good capillary refill, no trophic changes or ulcerative lesions and normal DP and PT pulses.  She has mild pain on palpation of the dorsal first interspace.  Normal pain and good range of motion of the first MTPJ  Radiographs: X-ray of the left foot: Appears to have some arthritic changes around the fibular sesamoid complex Assessment:   1. 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 Nerve pain      Plan:  Patient was evaluated and treated and all questions answered.  Reviewed the clinical and radiographic findings with the patient in detail.  We discussed that I think this is likely related to her previous nerve entrapment and spine issues.  She did see Dr. Posey Pronto recently with neurology who recommended a lumbar MRI as well as new EMG and NCV.  Currently there is no findings to explain these types of symptoms that would be treatable locally in the lower extremity itself I think it is related to a more proximal issue.  If the nerve conduction and lumbar MRI did not show any findings to explain it then a foot and ankle MRI could elicit more information.  Return as needed  Return if symptoms worsen or fail to improve.

## 2021-01-11 ENCOUNTER — Other Ambulatory Visit: Payer: Self-pay

## 2021-01-11 ENCOUNTER — Inpatient Hospital Stay (HOSPITAL_COMMUNITY)
Admission: EM | Admit: 2021-01-11 | Discharge: 2021-01-17 | DRG: 871 | Disposition: A | Discharge status: Home / Self Care | Payer: PPO | Attending: Internal Medicine | Admitting: Internal Medicine

## 2021-01-11 DIAGNOSIS — R7881 Bacteremia: Secondary | ICD-10-CM

## 2021-01-11 DIAGNOSIS — Z880 Allergy status to penicillin: Secondary | ICD-10-CM

## 2021-01-11 DIAGNOSIS — I959 Hypotension, unspecified: Secondary | ICD-10-CM | POA: Diagnosis not present

## 2021-01-11 DIAGNOSIS — A419 Sepsis, unspecified organism: Secondary | ICD-10-CM

## 2021-01-11 DIAGNOSIS — G5792 Unspecified mononeuropathy of left lower limb: Secondary | ICD-10-CM | POA: Diagnosis present

## 2021-01-11 DIAGNOSIS — R0689 Other abnormalities of breathing: Secondary | ICD-10-CM | POA: Diagnosis not present

## 2021-01-11 DIAGNOSIS — R000 Tachycardia, unspecified: Secondary | ICD-10-CM | POA: Diagnosis not present

## 2021-01-11 DIAGNOSIS — K8032 Calculus of bile duct with acute cholangitis without obstruction: Secondary | ICD-10-CM | POA: Diagnosis present

## 2021-01-11 DIAGNOSIS — E1165 Type 2 diabetes mellitus with hyperglycemia: Secondary | ICD-10-CM | POA: Diagnosis present

## 2021-01-11 DIAGNOSIS — E539 Vitamin B deficiency, unspecified: Secondary | ICD-10-CM | POA: Diagnosis present

## 2021-01-11 DIAGNOSIS — N179 Acute kidney failure, unspecified: Secondary | ICD-10-CM | POA: Diagnosis present

## 2021-01-11 DIAGNOSIS — J9811 Atelectasis: Secondary | ICD-10-CM | POA: Diagnosis present

## 2021-01-11 DIAGNOSIS — N183 Chronic kidney disease, stage 3 unspecified: Secondary | ICD-10-CM | POA: Diagnosis present

## 2021-01-11 DIAGNOSIS — A4189 Other specified sepsis: Principal | ICD-10-CM | POA: Diagnosis present

## 2021-01-11 DIAGNOSIS — E785 Hyperlipidemia, unspecified: Secondary | ICD-10-CM | POA: Diagnosis present

## 2021-01-11 DIAGNOSIS — Z885 Allergy status to narcotic agent status: Secondary | ICD-10-CM

## 2021-01-11 DIAGNOSIS — Z888 Allergy status to other drugs, medicaments and biological substances status: Secondary | ICD-10-CM

## 2021-01-11 DIAGNOSIS — F32A Depression, unspecified: Secondary | ICD-10-CM | POA: Diagnosis present

## 2021-01-11 DIAGNOSIS — R0902 Hypoxemia: Secondary | ICD-10-CM | POA: Diagnosis not present

## 2021-01-11 DIAGNOSIS — Z87448 Personal history of other diseases of urinary system: Secondary | ICD-10-CM

## 2021-01-11 DIAGNOSIS — J9601 Acute respiratory failure with hypoxia: Secondary | ICD-10-CM | POA: Diagnosis present

## 2021-01-11 DIAGNOSIS — Z794 Long term (current) use of insulin: Secondary | ICD-10-CM

## 2021-01-11 DIAGNOSIS — R001 Bradycardia, unspecified: Secondary | ICD-10-CM | POA: Diagnosis present

## 2021-01-11 DIAGNOSIS — K8309 Other cholangitis: Secondary | ICD-10-CM

## 2021-01-11 DIAGNOSIS — G9340 Encephalopathy, unspecified: Secondary | ICD-10-CM | POA: Diagnosis present

## 2021-01-11 DIAGNOSIS — E8881 Metabolic syndrome: Secondary | ICD-10-CM | POA: Diagnosis present

## 2021-01-11 DIAGNOSIS — Z7982 Long term (current) use of aspirin: Secondary | ICD-10-CM

## 2021-01-11 DIAGNOSIS — Z8249 Family history of ischemic heart disease and other diseases of the circulatory system: Secondary | ICD-10-CM

## 2021-01-11 DIAGNOSIS — Z820 Family history of epilepsy and other diseases of the nervous system: Secondary | ICD-10-CM

## 2021-01-11 DIAGNOSIS — E1122 Type 2 diabetes mellitus with diabetic chronic kidney disease: Secondary | ICD-10-CM | POA: Diagnosis present

## 2021-01-11 DIAGNOSIS — Z6833 Body mass index (BMI) 33.0-33.9, adult: Secondary | ICD-10-CM

## 2021-01-11 DIAGNOSIS — E1141 Type 2 diabetes mellitus with diabetic mononeuropathy: Secondary | ICD-10-CM | POA: Diagnosis present

## 2021-01-11 DIAGNOSIS — E669 Obesity, unspecified: Secondary | ICD-10-CM | POA: Diagnosis present

## 2021-01-11 DIAGNOSIS — Z20822 Contact with and (suspected) exposure to covid-19: Secondary | ICD-10-CM | POA: Diagnosis present

## 2021-01-11 DIAGNOSIS — K8050 Calculus of bile duct without cholangitis or cholecystitis without obstruction: Secondary | ICD-10-CM

## 2021-01-11 DIAGNOSIS — F419 Anxiety disorder, unspecified: Secondary | ICD-10-CM | POA: Diagnosis present

## 2021-01-11 DIAGNOSIS — N1832 Chronic kidney disease, stage 3b: Secondary | ICD-10-CM | POA: Diagnosis present

## 2021-01-11 DIAGNOSIS — Z79899 Other long term (current) drug therapy: Secondary | ICD-10-CM

## 2021-01-11 DIAGNOSIS — K219 Gastro-esophageal reflux disease without esophagitis: Secondary | ICD-10-CM | POA: Diagnosis present

## 2021-01-11 DIAGNOSIS — D6959 Other secondary thrombocytopenia: Secondary | ICD-10-CM | POA: Diagnosis present

## 2021-01-11 DIAGNOSIS — I129 Hypertensive chronic kidney disease with stage 1 through stage 4 chronic kidney disease, or unspecified chronic kidney disease: Secondary | ICD-10-CM | POA: Diagnosis present

## 2021-01-11 DIAGNOSIS — G928 Other toxic encephalopathy: Secondary | ICD-10-CM | POA: Diagnosis present

## 2021-01-11 DIAGNOSIS — R6521 Severe sepsis with septic shock: Secondary | ICD-10-CM | POA: Diagnosis present

## 2021-01-11 DIAGNOSIS — J309 Allergic rhinitis, unspecified: Secondary | ICD-10-CM | POA: Diagnosis present

## 2021-01-11 DIAGNOSIS — I10 Essential (primary) hypertension: Secondary | ICD-10-CM | POA: Diagnosis present

## 2021-01-11 DIAGNOSIS — E1169 Type 2 diabetes mellitus with other specified complication: Secondary | ICD-10-CM | POA: Diagnosis present

## 2021-01-11 DIAGNOSIS — Z833 Family history of diabetes mellitus: Secondary | ICD-10-CM

## 2021-01-11 DIAGNOSIS — R1011 Right upper quadrant pain: Secondary | ICD-10-CM

## 2021-01-11 DIAGNOSIS — Z981 Arthrodesis status: Secondary | ICD-10-CM

## 2021-01-11 DIAGNOSIS — D696 Thrombocytopenia, unspecified: Secondary | ICD-10-CM | POA: Diagnosis present

## 2021-01-11 DIAGNOSIS — R404 Transient alteration of awareness: Secondary | ICD-10-CM | POA: Diagnosis not present

## 2021-01-11 DIAGNOSIS — Z6831 Body mass index (BMI) 31.0-31.9, adult: Secondary | ICD-10-CM

## 2021-01-11 NOTE — ED Triage Notes (Signed)
Patient coming from home A&Ox2 not able to sit up she normally able to walk. Vomiting and has not been able to eat anything today. 103.66F warm to touch.  EMS gave 1L bolus  18g IV L AC  120/60-HR 97-RR 30  86 RA 100% 4L  cbg: 150

## 2021-01-11 NOTE — ED Provider Notes (Signed)
[facility] DEPT Provider Note   CSN: [id number] Arrival date & time: 01/11/21  2307     History Chief Complaint  Patient presents with  . Altered Mental Status  . Code Sepsis    Tina Briggs is a 75 y.o. female with a history of glomerulonephritis, aspiration pneumonia secondary to common bile duct obstruction, HTN, HLD, diabetes mellitus who presents the emergency department by EMS for fever.  EMS reports the patient was ANO x2 on arrival.  She was initially not able to sit up in bed, but is normally able to ambulate independently at baseline.  She has been having vomiting, fever, fatigue, and has been very drowsy, which prompted her visit to the ER.  Febrile to 103.9 with EMS.  She was given 1 L bolus of fluids.  On my evaluation, patient is alert and oriented x4.  Reports that she began feeling poorly 6 days ago, but after the first 24 hours her symptoms seem to improve.  However, for the last 4 days she has been steadily more fatigued with chills, malaise, myalgias, fever, nausea, vomiting, abdominal pain, and shortness of breath.  Reports that she has barely gotten out of bed due to her symptoms.  No known aggravating or alleviating factors.  She denies chest pain, sore throat, dysuria, hematuria, diarrhea, constipation, neck pain.   The history is provided by the patient and medical records. No language interpreter was used.       Past Medical History:  Diagnosis Date  . Anemia   . Aspiration pneumonia ([place])    after common bile duct obstruction  . CATARACT, RIGHT EYE    had repaired  . Depression   . Diverticulitis    recurrent episodes  . DM   . GERD   . 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 Mild to mod TI scarring. (Dr. Lorrene Reid)  . HNP (herniated nucleus pulposus), lumbar    Recurrent  . HYPERLIPIDEMIA-MIXED   . HYPERTENSION, UNSPECIFIED   . OBESITY   . PONV  (postoperative nausea and vomiting)    past history only, "sometimes they put a patch on me"    Patient Active Problem List   Diagnosis Date Noted  . Ascending cholangitis 01/12/2021  . Acute embolism and thrombosis of unspecified deep veins of left lower extremity ([place]) 11/23/2020  . Allergic rhinitis 11/23/2020  . Anxiety 11/23/2020  . Benign neoplasm of stomach 11/23/2020  . Cholangiectasis 11/23/2020  . Cholelithiasis without obstruction 11/23/2020  . Complete fecal incontinence 11/23/2020  . Diabetic renal disease ([place]) 11/23/2020  . Diverticulitis of colon 11/23/2020  . Edema of lower extremity 11/23/2020  . Essential tremor 11/23/2020  . Fatty liver 11/23/2020  . Functional diarrhea 11/23/2020  . Hardening of the aorta (main artery of the heart) ([place]) 11/23/2020  . History of diverticulitis 11/23/2020  . History of iron deficiency 11/23/2020  . Hypomagnesemia 11/23/2020  . Iron deficiency anemia 11/23/2020  . Left lower quadrant pain 11/23/2020  . Long term (current) use of insulin ([place]) 11/23/2020  . Major depression in complete remission ([place]) 11/23/2020  . 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  . Microscopic hematuria 11/23/2020  . Osteoarthritis 11/23/2020  . Pain in limb 11/23/2020  . Personal history of colonic polyps 11/23/2020  . Sciatica 11/23/2020  . Unspecified thoracic, thoracolumbar and lumbosacral intervertebral disc disorder 11/23/2020  . Vitamin B deficiency 11/23/2020  . Low back pain 06/01/2019  . Inflammation of sacroiliac joint ([place]) 02/09/2019  . Diverticulitis  10/15/2018  . S/P lumbar fusion 09/16/2018  . Prolapsed lumbar disc 08/31/2018  . Pain in left foot 06/06/2018  . S/P lumbar laminectomy 12/31/2017  . Lumbar radiculopathy 12/09/2017  . 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 Foot-drop 12/08/2017  . Glomerulonephritis 09/30/2017  . Elevated serum immunoglobulin free light chain level 08/26/2017  . Chronic kidney  disease, stage 3 ([place]) 06/07/2015  . Choledocholithiasis 06/07/2015  . Aspiration pneumonitis ([place]) 06/06/2015  . Anemia 06/06/2015  . Diabetes mellitus type 2 in obese ([place]) 05/25/2015  . Elevated liver enzymes 05/24/2015  . RUQ abdominal pain 05/24/2015  . Hypercholesterolemia 10/27/2014  . Obesity 02/15/2009  . CATARACT, RIGHT EYE 02/15/2009  . Essential hypertension 02/15/2009  . GERD 02/15/2009    Past Surgical History:  Procedure Laterality Date  . ABDOMINAL HYSTERECTOMY    . APPENDECTOMY    . BACK SURGERY     lumbar '09"microdiscectomy"  . CHOLECYSTECTOMY     open  . COLONOSCOPY WITH PROPOFOL N/A 11/22/2014   Procedure: COLONOSCOPY WITH PROPOFOL;  Surgeon: Garlan Fair, MD;  Location: [facility];  Service: Endoscopy;  Laterality: N/A;  . ERCP N/A 05/26/2015   Procedure: ENDOSCOPIC RETROGRADE CHOLANGIOPANCREATOGRAPHY (ERCP)   (DOING CASE IN MAIN OR);  Surgeon: Teena Irani, MD;  Location: [facility];  Service: Gastroenterology;  Laterality: N/A;  . ERCP N/A 06/06/2015   Procedure: ENDOSCOPIC RETROGRADE CHOLANGIOPANCREATOGRAPHY (ERCP);  Surgeon: Clarene Essex, MD;  Location: [facility];  Service: Endoscopy;  Laterality: N/A;  . ESOPHAGOGASTRODUODENOSCOPY (EGD) WITH PROPOFOL N/A 11/22/2014   Procedure: ESOPHAGOGASTRODUODENOSCOPY (EGD) WITH PROPOFOL;  Surgeon: Garlan Fair, MD;  Location: [facility];  Service: Endoscopy;  Laterality: N/A;  . EYE SURGERY Bilateral    cataracts  . LUMBAR FUSION  09/16/2018  . LUMBAR LAMINECTOMY/DECOMPRESSION MICRODISCECTOMY Left 12/31/2017   Procedure: Laminectomy and Foraminotomy - Lumbar Three-Four - left Extraforaminal diskectomy Lumbar Four-Five left;  Surgeon: Eustace Moore, MD;  Location: [facility];  Service: Neurosurgery;  Laterality: Left;  . PERONEAL NERVE DECOMPRESSION Left 12/31/2017   Procedure: Left PERONEAL NERVE DECOMPRESSION;  Surgeon: Eustace Moore, MD;  Location: [facility];  Service: Neurosurgery;  Laterality: Left;  left  . TUBAL  LIGATION       OB History   No obstetric history on file.     Family History  Problem Relation Age of Onset  . Cancer Maternal Aunt        breast cancer  . Cancer Maternal Grandmother        kidney cancer   . Cancer Paternal Grandmother        bone cancer   . Heart disease Mother   . Alzheimer's disease Father   . Tremor Father        possible PD  . Diabetes Mellitus II Brother   . Healthy Son   . Healthy Son     Social History   Tobacco Use  . Smoking status: Never Smoker  . Smokeless tobacco: Never Used  Vaping Use  . Vaping Use: Never used  Substance Use Topics  . Alcohol use: No  . Drug use: No    Home Medications Prior to Admission medications   Medication Sig Start Date End Date Taking? Authorizing Provider  empagliflozin (JARDIANCE) 10 MG TABS tablet Take by mouth daily.   Yes [provider]  gabapentin (NEURONTIN) 300 MG capsule Take 300 mg by mouth 4 (four) times daily. 10/26/18  Yes [provider]  insulin aspart (NOVOLOG) 100 UNIT/ML  injection Inject 18-34 Units into the skin 3 (three) times daily before meals. Sliding scale 12-18 per patient   Yes [provider]  acetaminophen (TYLENOL) 500 MG tablet Take 1,000 mg by mouth 2 (two) times daily as needed for moderate pain or headache.     [provider]  amLODipine (NORVASC) 10 MG tablet Take 10 mg by mouth daily. Patient not taking: Reported on 01/01/2021    [provider]  aspirin EC 81 MG tablet Take 81 mg by mouth daily.    [provider]  cholecalciferol (VITAMIN D) 1000 units tablet Take 1,000 Units by mouth daily.    [provider]  CRESTOR 40 MG tablet TAKE 1 TABLET AT BEDTIME Patient taking differently: Take 40 mg by mouth at bedtime. 04/19/13   Wall, Marijo Conception, MD  desloratadine (CLARINEX) 5 MG tablet Take 5 mg by mouth daily.    [provider]  fluticasone (FLONASE) 50 MCG/ACT nasal spray Place 2 sprays into both  nostrils daily as needed for allergies.     [provider]  LORazepam (ATIVAN) 0.5 MG tablet Take 0.5 mg by mouth every 8 (eight) hours.    [provider]  losartan (COZAAR) 25 MG tablet Take 25 mg by mouth 2 (two) times daily. 11/28/20   [provider]  pantoprazole (PROTONIX) 20 MG tablet Take 20 mg by mouth daily.    [provider]  venlafaxine XR (EFFEXOR-XR) 75 MG 24 hr capsule Take 75 mg by mouth every morning.     [provider]  vitamin B-12 (CYANOCOBALAMIN) 1000 MCG tablet Take 1,000 mcg by mouth 3 (three) times a week. Take on Sundays, Wednesdays and Fridays.    [provider]    Allergies    Codeine, Amoxicillin, Clavulanic acid, Flagyl [metronidazole], Other, Prevacid [lansoprazole], Tetracyclines & related, Trulicity [dulaglutide], Azithromycin, Erythromycin, Morphine and related, Penicillins, and Septra [sulfamethoxazole-trimethoprim]  Review of Systems   Review of Systems  Constitutional: Positive for appetite change, chills, fatigue and fever. Negative for activity change.  HENT: Negative for congestion and sore throat.   Eyes: Negative for visual disturbance.  Respiratory: Positive for shortness of breath. Negative for cough and wheezing.   Cardiovascular: Negative for chest pain and palpitations.  Gastrointestinal: Positive for abdominal pain, nausea and vomiting. Negative for constipation and diarrhea.  Genitourinary: Negative for dysuria and urgency.  Musculoskeletal: Negative for back pain, gait problem, myalgias, neck pain and neck stiffness.  Skin: Negative for rash and wound.  Allergic/Immunologic: Negative for immunocompromised state.  Neurological: Positive for weakness. Negative for dizziness, seizures, syncope, numbness and headaches.  Psychiatric/Behavioral: Positive for confusion.    Physical Exam Updated Vital Signs BP (!) 85/59   Pulse 97   Temp 100 F (37.8 C) (Oral)   Resp (!) 27   Wt 80.7 kg    SpO2 96%   BMI 31.53 kg/m   Physical Exam Vitals and nursing note reviewed.  Constitutional:      General: She is not in acute distress.    Appearance: She is ill-appearing.     Comments: Nasal cannula in place.  HENT:     Head: Normocephalic.  Eyes:     Conjunctiva/sclera: Conjunctivae normal.  Cardiovascular:     Rate and Rhythm: Normal rate and regular rhythm.     Heart sounds: No murmur heard. No friction rub. No gallop.   Pulmonary:     Effort: Pulmonary effort is normal. No respiratory distress.     Breath sounds:  No stridor. No wheezing, rhonchi or rales.  Chest:     Chest wall: No tenderness.  Abdominal:     General: There is no distension.     Palpations: Abdomen is soft. There is no mass.     Tenderness: There is abdominal tenderness. There is guarding. There is no right CVA tenderness, left CVA tenderness or rebound.     Hernia: No hernia is present.     Comments: Tender to palpation in the right upper quadrant.  Positive Murphy sign.  There is voluntary guarding, but no rebound.   Musculoskeletal:     Cervical back: Neck supple.     Right lower leg: No edema.     Left lower leg: No edema.  Skin:    General: Skin is warm.     Capillary Refill: Capillary refill takes less than 2 seconds.     Findings: No rash.  Neurological:     Mental Status: She is alert.  Psychiatric:        Behavior: Behavior normal.     ED Results / Procedures / Treatments   Labs (all labs ordered are listed, but only abnormal results are displayed) Labs Reviewed  LACTIC ACID, PLASMA - Abnormal; Notable for the following components:      Result Value   Lactic Acid, Venous 2.6 (*)    All other components within normal limits  COMPREHENSIVE METABOLIC PANEL - Abnormal; Notable for the following components:   Chloride 113 (*)    CO2 20 (*)    Glucose, Bld 154 (*)    BUN 33 (*)    Creatinine, Ser 1.70 (*)    Calcium 8.3 (*)    Total Protein 5.8 (*)    Albumin 3.1 (*)    AST  330 (*)    ALT 425 (*)    Total Bilirubin 1.3 (*)    GFR, Estimated 31 (*)    All other components within normal limits  CBC WITH DIFFERENTIAL/PLATELET - Abnormal; Notable for the following components:   Platelets 110 (*)    Neutro Abs 8.7 (*)    Lymphs Abs 0.3 (*)    Abs Immature Granulocytes 0.09 (*)    All other components within normal limits  PROTIME-INR - Abnormal; Notable for the following components:   Prothrombin Time 15.4 (*)    All other components within normal limits  URINALYSIS, ROUTINE W REFLEX MICROSCOPIC - Abnormal; Notable for the following components:   APPearance HAZY (*)    Glucose, UA >=500 (*)    Hgb urine dipstick SMALL (*)    Protein, ur >=300 (*)    Bacteria, UA RARE (*)    All other components within normal limits  CBG MONITORING, ED - Abnormal; Notable for the following components:   Glucose-Capillary 136 (*)    All other components within normal limits  RESP PANEL BY RT-PCR (FLU A&B, COVID) ARPGX2  CULTURE, BLOOD (ROUTINE X 2)  CULTURE, BLOOD (ROUTINE X 2)  URINE CULTURE  MRSA PCR SCREENING  LACTIC ACID, PLASMA  APTT  LIPASE, BLOOD    EKG EKG Interpretation  Date/Time:  Friday January 12 2021 00:11:35 EDT Ventricular Rate:  95 PR Interval:    QRS Duration: 87 QT Interval:  336 QTC Calculation: 423 R Axis:   -42 Text Interpretation: Sinus rhythm Left anterior fascicular block Abnormal R-wave progression, early transition No significant change since last tracing Confirmed by Calvert Cantor [phone]) on 01/12/2021 12:24:22 AM   Radiology CT ABDOMEN PELVIS WO CONTRAST  Result Date: 01/12/2021 CLINICAL DATA:  Dyspnea, fever, sepsis, glomerulonephritis EXAM: CT CHEST, ABDOMEN AND PELVIS WITHOUT CONTRAST TECHNIQUE: Multidetector CT imaging of the chest, abdomen and pelvis was performed following the standard protocol without IV contrast. COMPARISON:  05/26/2020 FINDINGS: CT CHEST FINDINGS Cardiovascular: Mild coronary artery calcification. Global  cardiac size within normal limits. No pericardial effusion. The central pulmonary arteries are of normal caliber. Mild atherosclerotic calcification within the thoracic aorta. No aortic aneurysm. Mediastinum/Nodes: The visualized thyroid is unremarkable. No pathologic thoracic adenopathy. Moderate hiatal hernia. The esophagus demonstrates an air-fluid level which may relate to gastroesophageal reflux or esophageal dysmotility. Lungs/Pleura: Mild bibasilar atelectasis. Trace interstitial pulmonary edema with smooth interlobular septal thickening at the lung bases and apices. No confluent pulmonary infiltrate. No pneumothorax or pleural effusion. The central airways are widely patent. Musculoskeletal: No acute bone abnormality. No suspicious lytic or blastic bone lesion. CT ABDOMEN PELVIS FINDINGS Hepatobiliary: Status post cholecystectomy. Mild pneumobilia suggests prior sphincterotomy. There is, however, hyperdensity noted within the central biliary tree, better appreciated on coronal imaging representing a intraluminal calculus within the central left hepatic bile ducts measuring 10 mm x 18 mm. There is mild left intrahepatic biliary ductal dilation and fluid opacification of these ducts in keeping with an obstructed biliary tree. There is persistent pneumobilia within the a right hepatic ducts indicating patency of this portion of the biliary tree. The liver is otherwise unremarkable. Pancreas: Unremarkable Spleen: Unremarkable Adrenals/Urinary Tract: The adrenal glands are unremarkable. The kidneys are normal in size and position. No intrarenal or ureteral calculi. No hydronephrosis. The bladder is mildly distended, but is otherwise unremarkable. Stomach/Bowel: Extensive descending and sigmoid colonic diverticulosis. No superimposed inflammatory change. The stomach, small bowel, and large bowel are otherwise unremarkable. Appendix absent. No free intraperitoneal fluid or gas. Vascular/Lymphatic: Mild  atherosclerotic calcification within the thoracic aorta. No aortic aneurysm. No pathologic adenopathy within the abdomen and pelvis. Reproductive: Status post hysterectomy. No adnexal masses. Other: Tiny fat containing umbilical hernia.  Rectum unremarkable. Musculoskeletal: No lytic or blastic bone lesion. L4-5 fusion with instrumentation has been performed. No acute bone abnormality. IMPRESSION: Intraluminal calculi within the central left biliary tree with distal fluid opacification and dilation of the intrahepatic bile ducts within the left hepatic lobe. This may manifest clinically as sepsis secondary to ascending cholangitis. Mild coronary artery calcification. Trace interstitial pulmonary edema. Extensive distal colonic diverticulosis Aortic Atherosclerosis (ICD10-I70.0). Electronically Signed   By: Fidela Salisbury MD   On: 01/12/2021 02:44   CT CHEST WO CONTRAST  Result Date: 01/12/2021 CLINICAL DATA:  Dyspnea, fever, sepsis, glomerulonephritis EXAM: CT CHEST, ABDOMEN AND PELVIS WITHOUT CONTRAST TECHNIQUE: Multidetector CT imaging of the chest, abdomen and pelvis was performed following the standard protocol without IV contrast. COMPARISON:  05/26/2020 FINDINGS: CT CHEST FINDINGS Cardiovascular: Mild coronary artery calcification. Global cardiac size within normal limits. No pericardial effusion. The central pulmonary arteries are of normal caliber. Mild atherosclerotic calcification within the thoracic aorta. No aortic aneurysm. Mediastinum/Nodes: The visualized thyroid is unremarkable. No pathologic thoracic adenopathy. Moderate hiatal hernia. The esophagus demonstrates an air-fluid level which may relate to gastroesophageal reflux or esophageal dysmotility. Lungs/Pleura: Mild bibasilar atelectasis. Trace interstitial pulmonary edema with smooth interlobular septal thickening at the lung bases and apices. No confluent pulmonary infiltrate. No pneumothorax or pleural effusion. The central airways are  widely patent. Musculoskeletal: No acute bone abnormality. No suspicious lytic or blastic bone lesion. CT ABDOMEN PELVIS FINDINGS Hepatobiliary: Status post cholecystectomy. Mild pneumobilia suggests prior sphincterotomy. There is, however, hyperdensity noted within  the central biliary tree, better appreciated on coronal imaging representing a intraluminal calculus within the central left hepatic bile ducts measuring 10 mm x 18 mm. There is mild left intrahepatic biliary ductal dilation and fluid opacification of these ducts in keeping with an obstructed biliary tree. There is persistent pneumobilia within the a right hepatic ducts indicating patency of this portion of the biliary tree. The liver is otherwise unremarkable. Pancreas: Unremarkable Spleen: Unremarkable Adrenals/Urinary Tract: The adrenal glands are unremarkable. The kidneys are normal in size and position. No intrarenal or ureteral calculi. No hydronephrosis. The bladder is mildly distended, but is otherwise unremarkable. Stomach/Bowel: Extensive descending and sigmoid colonic diverticulosis. No superimposed inflammatory change. The stomach, small bowel, and large bowel are otherwise unremarkable. Appendix absent. No free intraperitoneal fluid or gas. Vascular/Lymphatic: Mild atherosclerotic calcification within the thoracic aorta. No aortic aneurysm. No pathologic adenopathy within the abdomen and pelvis. Reproductive: Status post hysterectomy. No adnexal masses. Other: Tiny fat containing umbilical hernia.  Rectum unremarkable. Musculoskeletal: No lytic or blastic bone lesion. L4-5 fusion with instrumentation has been performed. No acute bone abnormality. IMPRESSION: Intraluminal calculi within the central left biliary tree with distal fluid opacification and dilation of the intrahepatic bile ducts within the left hepatic lobe. This may manifest clinically as sepsis secondary to ascending cholangitis. Mild coronary artery calcification. Trace  interstitial pulmonary edema. Extensive distal colonic diverticulosis Aortic Atherosclerosis (ICD10-I70.0). Electronically Signed   By: Fidela Salisbury MD   On: 01/12/2021 02:44   DG Chest Port 1 View  Result Date: 01/12/2021 CLINICAL DATA:  Vomiting and fever EXAM: PORTABLE CHEST 1 VIEW COMPARISON:  12/25/2017 FINDINGS: The heart size and mediastinal contours are within normal limits. Both lungs are clear. The visualized skeletal structures are unremarkable. IMPRESSION: No active disease. Electronically Signed   By: Ulyses Jarred M.D.   On: 01/12/2021 00:17    Procedures .Critical Care Performed by: Joanne Gavel, PA-C Authorized by: Joanne Gavel, PA-C   Critical care provider statement:    Critical care time (minutes):  45   Critical care time was exclusive of:  Separately billable procedures and treating other patients and teaching time   Critical care was necessary to treat or prevent imminent or life-threatening deterioration of the following conditions:  Sepsis   Critical care was time spent personally by me on the following activities:  Ordering and performing treatments and interventions, ordering and review of laboratory studies, ordering and review of radiographic studies, pulse oximetry, re-evaluation of patient's condition, obtaining history from patient or surrogate, examination of patient, evaluation of patient's response to treatment, development of treatment plan with patient or surrogate and review of old charts   I assumed direction of critical care for this patient from another provider in my specialty: no     Care discussed with: admitting provider       Medications Ordered in ED Medications  meropenem (MERREM) 1 g in sodium chloride 0.9 % 100 mL IVPB (1 g Intravenous New Bag/Given 01/12/21 0416)  Chlorhexidine Gluconate Cloth 2 % PADS 6 each (has no administration in time range)  MEDLINE mouth rinse (has no administration in time range)  lactated ringers bolus 2,000  mL (0 mLs Intravenous Stopped 01/12/21 0300)  vancomycin (VANCOREADY) IVPB 1500 mg/300 mL (1,500 mg Intravenous New Bag/Given 01/12/21 0127)  ceFEPIme (MAXIPIME) 2 g in sodium chloride 0.9 % 100 mL IVPB (0 g Intravenous Stopped 01/12/21 0301)  acetaminophen (TYLENOL) tablet 650 mg (650 mg Oral Given 01/12/21 0158)  albumin human  25 % solution 25 g (12.5 g Intravenous New Bag/Given 01/12/21 0520)    ED Course  I have reviewed the triage vital signs and the nursing notes.  Pertinent labs & imaging results that were available during my care of the patient were reviewed by me and considered in my medical decision making (see chart for details).    MDM Rules/Calculators/A&P                          75 year old female with a history of glomerulonephritis, aspiration pneumonia secondary to common bile duct obstruction, HTN, HLD, diabetes mellitus who presents the emergency department with a 6-day history of malaise, generalized weakness, nausea, vomiting, abdominal pain, fever, and chills.  Patient was noted to be hypoxic on arrival with EMS.  Febrile.  She is ill-appearing.  On exam, she is focally tender in the right upper quadrant.  Patient was discussed with Dr. Karle Starch, attending physician.  Labs and imaging have been reviewed and independently interpreted by me.  Patient met code sepsis criteria upon arrival.  Blood cultures x2 ordered prior to administration of vancomycin and cefepime.  Initially, there was concern for pulmonary etiology given acute hypoxia, but patient was also endorsing abdominal pain.  Per chart review, patient did have a history listed of a lower extremity DVT.  With hypoxia, have plan to order CT PE study since her pain was predominantly in her right upper quadrant, but when I reevaluated the patient after she had defervesced she adamantly denies a history of DVT or PE.  Patient also unable to obtain CT PE study due to her creatinine clearance.  However, given her presentation,  I am less suspicious for PE.  CT noncontrasted chest and abdomen pelvis were obtained and were consistent with acute ascending cholangitis.  I discussed with pharmacist given the patient's many allergies and will start the patient on meropenem.  She has a new AKI today.  She initially had a lactic acidosis that resolved when she was given a 30 cc/kg fluid bolus.  Transaminases are acutely elevated, consistent with acute cholangitis.  The patient is critically ill and will require admission for further work-up and evaluation.  Consult to the hospitalist team and Dr. Myna Hidalgo will accept the patient for admission. The patient appears reasonably stabilized for admission considering the current resources, flow, and capabilities available in the ED at this time, and I doubt any other [facility] requiring further screening and/or treatment in the ED prior to admission.  Final Clinical Impression(s) / ED Diagnoses Final diagnoses:  RUQ abdominal pain  Sepsis with acute hypoxic respiratory failure and septic shock, due to unspecified organism [facility] [facility])  Ascending cholangitis    Rx / DC Orders ED Discharge Orders    None       Annaelle Kasel A, PA-C 01/12/21 0600    Truddie Hidden, MD 01/12/21 [phone]

## 2021-01-12 ENCOUNTER — Inpatient Hospital Stay (HOSPITAL_COMMUNITY): Payer: PPO

## 2021-01-12 ENCOUNTER — Inpatient Hospital Stay (HOSPITAL_COMMUNITY): Payer: PPO | Admitting: Certified Registered"

## 2021-01-12 ENCOUNTER — Encounter (HOSPITAL_COMMUNITY)
Admission: EM | Disposition: A | Discharge status: Home / Self Care | Payer: Self-pay | Source: Home / Self Care | Attending: Internal Medicine

## 2021-01-12 ENCOUNTER — Encounter (HOSPITAL_COMMUNITY): Payer: Self-pay | Admitting: *Deleted

## 2021-01-12 ENCOUNTER — Emergency Department (HOSPITAL_COMMUNITY): Payer: PPO

## 2021-01-12 DIAGNOSIS — A419 Sepsis, unspecified organism: Secondary | ICD-10-CM | POA: Diagnosis not present

## 2021-01-12 DIAGNOSIS — I10 Essential (primary) hypertension: Secondary | ICD-10-CM | POA: Diagnosis not present

## 2021-01-12 DIAGNOSIS — R6521 Severe sepsis with septic shock: Secondary | ICD-10-CM

## 2021-01-12 DIAGNOSIS — Z20822 Contact with and (suspected) exposure to covid-19: Secondary | ICD-10-CM | POA: Diagnosis not present

## 2021-01-12 DIAGNOSIS — R1011 Right upper quadrant pain: Secondary | ICD-10-CM | POA: Diagnosis not present

## 2021-01-12 DIAGNOSIS — Z6831 Body mass index (BMI) 31.0-31.9, adult: Secondary | ICD-10-CM | POA: Diagnosis not present

## 2021-01-12 DIAGNOSIS — R072 Precordial pain: Secondary | ICD-10-CM | POA: Diagnosis not present

## 2021-01-12 DIAGNOSIS — R0609 Other forms of dyspnea: Secondary | ICD-10-CM | POA: Diagnosis not present

## 2021-01-12 DIAGNOSIS — G5792 Unspecified mononeuropathy of left lower limb: Secondary | ICD-10-CM | POA: Diagnosis not present

## 2021-01-12 DIAGNOSIS — R109 Unspecified abdominal pain: Secondary | ICD-10-CM | POA: Diagnosis not present

## 2021-01-12 DIAGNOSIS — R932 Abnormal findings on diagnostic imaging of liver and biliary tract: Secondary | ICD-10-CM | POA: Diagnosis not present

## 2021-01-12 DIAGNOSIS — Z794 Long term (current) use of insulin: Secondary | ICD-10-CM | POA: Diagnosis not present

## 2021-01-12 DIAGNOSIS — D696 Thrombocytopenia, unspecified: Secondary | ICD-10-CM

## 2021-01-12 DIAGNOSIS — I2510 Atherosclerotic heart disease of native coronary artery without angina pectoris: Secondary | ICD-10-CM | POA: Diagnosis not present

## 2021-01-12 DIAGNOSIS — E1122 Type 2 diabetes mellitus with diabetic chronic kidney disease: Secondary | ICD-10-CM | POA: Diagnosis not present

## 2021-01-12 DIAGNOSIS — K8032 Calculus of bile duct with acute cholangitis without obstruction: Secondary | ICD-10-CM | POA: Diagnosis not present

## 2021-01-12 DIAGNOSIS — K8309 Other cholangitis: Secondary | ICD-10-CM | POA: Diagnosis not present

## 2021-01-12 DIAGNOSIS — K5730 Diverticulosis of large intestine without perforation or abscess without bleeding: Secondary | ICD-10-CM | POA: Diagnosis not present

## 2021-01-12 DIAGNOSIS — I361 Nonrheumatic tricuspid (valve) insufficiency: Secondary | ICD-10-CM

## 2021-01-12 DIAGNOSIS — R1084 Generalized abdominal pain: Secondary | ICD-10-CM | POA: Diagnosis not present

## 2021-01-12 DIAGNOSIS — Z48815 Encounter for surgical aftercare following surgery on the digestive system: Secondary | ICD-10-CM | POA: Diagnosis not present

## 2021-01-12 DIAGNOSIS — E1169 Type 2 diabetes mellitus with other specified complication: Secondary | ICD-10-CM

## 2021-01-12 DIAGNOSIS — A4181 Sepsis due to Enterococcus: Secondary | ICD-10-CM | POA: Diagnosis not present

## 2021-01-12 DIAGNOSIS — J9811 Atelectasis: Secondary | ICD-10-CM | POA: Diagnosis not present

## 2021-01-12 DIAGNOSIS — E1165 Type 2 diabetes mellitus with hyperglycemia: Secondary | ICD-10-CM | POA: Diagnosis not present

## 2021-01-12 DIAGNOSIS — K8070 Calculus of gallbladder and bile duct without cholecystitis without obstruction: Secondary | ICD-10-CM | POA: Diagnosis not present

## 2021-01-12 DIAGNOSIS — N179 Acute kidney failure, unspecified: Secondary | ICD-10-CM | POA: Diagnosis not present

## 2021-01-12 DIAGNOSIS — J9601 Acute respiratory failure with hypoxia: Secondary | ICD-10-CM | POA: Diagnosis present

## 2021-01-12 DIAGNOSIS — K8050 Calculus of bile duct without cholangitis or cholecystitis without obstruction: Secondary | ICD-10-CM | POA: Diagnosis not present

## 2021-01-12 DIAGNOSIS — I129 Hypertensive chronic kidney disease with stage 1 through stage 4 chronic kidney disease, or unspecified chronic kidney disease: Secondary | ICD-10-CM | POA: Diagnosis not present

## 2021-01-12 DIAGNOSIS — J811 Chronic pulmonary edema: Secondary | ICD-10-CM | POA: Diagnosis not present

## 2021-01-12 DIAGNOSIS — K219 Gastro-esophageal reflux disease without esophagitis: Secondary | ICD-10-CM | POA: Diagnosis not present

## 2021-01-12 DIAGNOSIS — Z981 Arthrodesis status: Secondary | ICD-10-CM | POA: Diagnosis not present

## 2021-01-12 DIAGNOSIS — G9340 Encephalopathy, unspecified: Secondary | ICD-10-CM | POA: Diagnosis not present

## 2021-01-12 DIAGNOSIS — G928 Other toxic encephalopathy: Secondary | ICD-10-CM | POA: Diagnosis not present

## 2021-01-12 DIAGNOSIS — E785 Hyperlipidemia, unspecified: Secondary | ICD-10-CM | POA: Diagnosis not present

## 2021-01-12 DIAGNOSIS — Z880 Allergy status to penicillin: Secondary | ICD-10-CM | POA: Diagnosis not present

## 2021-01-12 DIAGNOSIS — D6959 Other secondary thrombocytopenia: Secondary | ICD-10-CM | POA: Diagnosis not present

## 2021-01-12 DIAGNOSIS — R1110 Vomiting, unspecified: Secondary | ICD-10-CM | POA: Diagnosis not present

## 2021-01-12 DIAGNOSIS — N1832 Chronic kidney disease, stage 3b: Secondary | ICD-10-CM | POA: Diagnosis not present

## 2021-01-12 DIAGNOSIS — R7401 Elevation of levels of liver transaminase levels: Secondary | ICD-10-CM | POA: Diagnosis not present

## 2021-01-12 DIAGNOSIS — D631 Anemia in chronic kidney disease: Secondary | ICD-10-CM | POA: Diagnosis not present

## 2021-01-12 DIAGNOSIS — E1141 Type 2 diabetes mellitus with diabetic mononeuropathy: Secondary | ICD-10-CM | POA: Diagnosis not present

## 2021-01-12 DIAGNOSIS — A4189 Other specified sepsis: Secondary | ICD-10-CM | POA: Diagnosis not present

## 2021-01-12 DIAGNOSIS — J810 Acute pulmonary edema: Secondary | ICD-10-CM | POA: Diagnosis not present

## 2021-01-12 DIAGNOSIS — Z6833 Body mass index (BMI) 33.0-33.9, adult: Secondary | ICD-10-CM | POA: Diagnosis not present

## 2021-01-12 DIAGNOSIS — R7881 Bacteremia: Secondary | ICD-10-CM | POA: Diagnosis not present

## 2021-01-12 DIAGNOSIS — N183 Chronic kidney disease, stage 3 unspecified: Secondary | ICD-10-CM

## 2021-01-12 DIAGNOSIS — A4151 Sepsis due to Escherichia coli [E. coli]: Secondary | ICD-10-CM | POA: Diagnosis not present

## 2021-01-12 DIAGNOSIS — E669 Obesity, unspecified: Secondary | ICD-10-CM | POA: Diagnosis not present

## 2021-01-12 DIAGNOSIS — R6520 Severe sepsis without septic shock: Secondary | ICD-10-CM | POA: Diagnosis not present

## 2021-01-12 DIAGNOSIS — K838 Other specified diseases of biliary tract: Secondary | ICD-10-CM | POA: Diagnosis not present

## 2021-01-12 DIAGNOSIS — F32A Depression, unspecified: Secondary | ICD-10-CM | POA: Diagnosis not present

## 2021-01-12 DIAGNOSIS — R001 Bradycardia, unspecified: Secondary | ICD-10-CM | POA: Diagnosis not present

## 2021-01-12 DIAGNOSIS — R509 Fever, unspecified: Secondary | ICD-10-CM | POA: Diagnosis not present

## 2021-01-12 HISTORY — PX: REMOVAL OF STONES: SHX5545

## 2021-01-12 HISTORY — PX: ERCP: SHX5425

## 2021-01-12 LAB — CBC WITH DIFFERENTIAL/PLATELET
Abs Immature Granulocytes: 0.09 10*3/uL — ABNORMAL HIGH (ref 0.00–0.07)
Abs Immature Granulocytes: 0.68 10*3/uL — ABNORMAL HIGH (ref 0.00–0.07)
Basophils Absolute: 0 10*3/uL (ref 0.0–0.1)
Basophils Absolute: 0.1 10*3/uL (ref 0.0–0.1)
Basophils Relative: 0 %
Basophils Relative: 0 %
Eosinophils Absolute: 0 10*3/uL (ref 0.0–0.5)
Eosinophils Absolute: 0.1 10*3/uL (ref 0.0–0.5)
Eosinophils Relative: 0 %
Eosinophils Relative: 1 %
HCT: 37.5 % (ref 36.0–46.0)
HCT: 31.3 % — ABNORMAL LOW (ref 36.0–46.0)
Hemoglobin: 12.2 g/dL (ref 12.0–15.0)
Hemoglobin: 10.4 g/dL — ABNORMAL LOW (ref 12.0–15.0)
Immature Granulocytes: 1 %
Immature Granulocytes: 4 %
Lymphocytes Relative: 3 %
Lymphocytes Relative: 4 %
Lymphs Abs: 0.3 10*3/uL — ABNORMAL LOW (ref 0.7–4.0)
Lymphs Abs: 0.7 10*3/uL (ref 0.7–4.0)
MCH: 29.8 pg (ref 26.0–34.0)
MCH: 30.6 pg (ref 26.0–34.0)
MCHC: 32.5 g/dL (ref 30.0–36.0)
MCHC: 33.2 g/dL (ref 30.0–36.0)
MCV: 91.7 fL (ref 80.0–100.0)
MCV: 92.1 fL (ref 80.0–100.0)
Monocytes Absolute: 0.4 10*3/uL (ref 0.1–1.0)
Monocytes Absolute: 0.9 10*3/uL (ref 0.1–1.0)
Monocytes Relative: 5 %
Monocytes Relative: 6 %
Neutro Abs: 8.7 10*3/uL — ABNORMAL HIGH (ref 1.7–7.7)
Neutro Abs: 13.9 10*3/uL — ABNORMAL HIGH (ref 1.7–7.7)
Neutrophils Relative %: 91 %
Neutrophils Relative %: 85 %
Platelets: 110 10*3/uL — ABNORMAL LOW (ref 150–400)
Platelets: 98 10*3/uL — ABNORMAL LOW (ref 150–400)
RBC: 4.09 MIL/uL (ref 3.87–5.11)
RBC: 3.4 MIL/uL — ABNORMAL LOW (ref 3.87–5.11)
RDW: 13.5 % (ref 11.5–15.5)
RDW: 13.9 % (ref 11.5–15.5)
WBC: 9.5 10*3/uL (ref 4.0–10.5)
WBC: 16.3 10*3/uL — ABNORMAL HIGH (ref 4.0–10.5)
nRBC: 0 % (ref 0.0–0.2)
nRBC: 0 % (ref 0.0–0.2)

## 2021-01-12 LAB — LACTIC ACID, PLASMA
Lactic Acid, Venous: 2.6 mmol/L (ref 0.5–1.9)
Lactic Acid, Venous: 1 mmol/L (ref 0.5–1.9)

## 2021-01-12 LAB — URINALYSIS, ROUTINE W REFLEX MICROSCOPIC
Bilirubin Urine: NEGATIVE
Glucose, UA: >=500 mg/dL — AB
Ketones, ur: NEGATIVE mg/dL
Leukocytes,Ua: NEGATIVE
Nitrite: NEGATIVE
Protein, ur: >=300 mg/dL — AB
Specific Gravity, Urine: 1.02 (ref 1.005–1.030)
pH: 5 (ref 5.0–8.0)

## 2021-01-12 LAB — APTT: aPTT: 27 seconds (ref 24–36)

## 2021-01-12 LAB — BLOOD CULTURE ID PANEL (REFLEXED) - BCID2

## 2021-01-12 LAB — COMPREHENSIVE METABOLIC PANEL
ALT: 425 U/L — ABNORMAL HIGH (ref 0–44)
ALT: 272 U/L — ABNORMAL HIGH (ref 0–44)
AST: 330 U/L — ABNORMAL HIGH (ref 15–41)
AST: 176 U/L — ABNORMAL HIGH (ref 15–41)
Albumin: 3.1 g/dL — ABNORMAL LOW (ref 3.5–5.0)
Albumin: 2.9 g/dL — ABNORMAL LOW (ref 3.5–5.0)
Alkaline Phosphatase: 102 U/L (ref 38–126)
Alkaline Phosphatase: 81 U/L (ref 38–126)
Anion gap: 8 (ref 5–15)
Anion gap: 10 (ref 5–15)
BUN: 33 mg/dL — ABNORMAL HIGH (ref 8–23)
BUN: 37 mg/dL — ABNORMAL HIGH (ref 8–23)
CO2: 20 mmol/L — ABNORMAL LOW (ref 22–32)
CO2: 17 mmol/L — ABNORMAL LOW (ref 22–32)
Calcium: 8.3 mg/dL — ABNORMAL LOW (ref 8.9–10.3)
Calcium: 8 mg/dL — ABNORMAL LOW (ref 8.9–10.3)
Chloride: 113 mmol/L — ABNORMAL HIGH (ref 98–111)
Chloride: 116 mmol/L — ABNORMAL HIGH (ref 98–111)
Creatinine, Ser: 1.7 mg/dL — ABNORMAL HIGH (ref 0.44–1.00)
Creatinine, Ser: 1.68 mg/dL — ABNORMAL HIGH (ref 0.44–1.00)
GFR, Estimated: 31 mL/min — ABNORMAL LOW (ref >60)
GFR, Estimated: 32 mL/min — ABNORMAL LOW (ref >60)
Glucose, Bld: 154 mg/dL — ABNORMAL HIGH (ref 70–99)
Glucose, Bld: 144 mg/dL — ABNORMAL HIGH (ref 70–99)
Potassium: 4.1 mmol/L (ref 3.5–5.1)
Potassium: 3.4 mmol/L — ABNORMAL LOW (ref 3.5–5.1)
Sodium: 141 mmol/L (ref 135–145)
Sodium: 143 mmol/L (ref 135–145)
Total Bilirubin: 1.3 mg/dL — ABNORMAL HIGH (ref 0.3–1.2)
Total Bilirubin: 0.9 mg/dL (ref 0.3–1.2)
Total Protein: 5.8 g/dL — ABNORMAL LOW (ref 6.5–8.1)
Total Protein: 5.3 g/dL — ABNORMAL LOW (ref 6.5–8.1)

## 2021-01-12 LAB — GLUCOSE, CAPILLARY
Glucose-Capillary: 137 mg/dL — ABNORMAL HIGH (ref 70–99)
Glucose-Capillary: 157 mg/dL — ABNORMAL HIGH (ref 70–99)
Glucose-Capillary: 180 mg/dL — ABNORMAL HIGH (ref 70–99)
Glucose-Capillary: 191 mg/dL — ABNORMAL HIGH (ref 70–99)

## 2021-01-12 LAB — PROCALCITONIN: Procalcitonin: 53.72 ng/mL

## 2021-01-12 LAB — RESP PANEL BY RT-PCR (FLU A&B, COVID) ARPGX2
Influenza A by PCR: NEGATIVE
Influenza B by PCR: NEGATIVE
SARS Coronavirus 2 by RT PCR: NEGATIVE

## 2021-01-12 LAB — LIPASE, BLOOD: Lipase: 39 U/L (ref 11–51)

## 2021-01-12 LAB — CBG MONITORING, ED: Glucose-Capillary: 136 mg/dL — ABNORMAL HIGH (ref 70–99)

## 2021-01-12 LAB — ECHOCARDIOGRAM COMPLETE
Area-P 1/2: 3.91 cm2
Height: 63 in
S' Lateral: 2.3 cm
Weight: 3001.78 oz

## 2021-01-12 LAB — MRSA PCR SCREENING: MRSA by PCR: NEGATIVE

## 2021-01-12 LAB — PROTIME-INR
INR: 1.2 (ref 0.8–1.2)
Prothrombin Time: 15.4 seconds — ABNORMAL HIGH (ref 11.4–15.2)

## 2021-01-12 LAB — CORTISOL: Cortisol, Plasma: 17.6 ug/dL

## 2021-01-12 SURGERY — ERCP, WITH INTERVENTION IF INDICATED
Anesthesia: General

## 2021-01-12 MED ORDER — ONDANSETRON HCL 4 MG/2ML IJ SOLN
INTRAMUSCULAR | Status: DC | PRN
  Administered 2021-01-12: 4 mg via INTRAVENOUS

## 2021-01-12 MED ORDER — PROPOFOL 10 MG/ML IV BOLUS
INTRAVENOUS | Status: AC
  Filled 2021-01-12: qty 20

## 2021-01-12 MED ORDER — ALBUMIN HUMAN 25 % IV SOLN
25.0000 g | Freq: Once | INTRAVENOUS | Status: AC
  Administered 2021-01-12: 12.5 g via INTRAVENOUS
  Filled 2021-01-12: qty 100

## 2021-01-12 MED ORDER — ONDANSETRON HCL 4 MG/2ML IJ SOLN: 4.0000 mg | Freq: Four times a day (QID) | INTRAMUSCULAR | Status: DC | PRN

## 2021-01-12 MED ORDER — SCOPOLAMINE 1 MG/3DAYS TD PT72
MEDICATED_PATCH | TRANSDERMAL | Status: AC
  Filled 2021-01-12: qty 1

## 2021-01-12 MED ORDER — SUCCINYLCHOLINE CHLORIDE 200 MG/10ML IV SOSY
PREFILLED_SYRINGE | INTRAVENOUS | Status: DC | PRN
  Administered 2021-01-12: 100 mg via INTRAVENOUS

## 2021-01-12 MED ORDER — LACTATED RINGERS IV BOLUS (SEPSIS)
2000.0000 mL | Freq: Once | INTRAVENOUS | Status: AC
  Administered 2021-01-12: 2000 mL via INTRAVENOUS

## 2021-01-12 MED ORDER — IOHEXOL 350 MG/ML SOLN: 80.0000 mL | Freq: Once | INTRAVENOUS | Status: DC | PRN

## 2021-01-12 MED ORDER — SODIUM CHLORIDE 0.9 % IV SOLN
2.0000 g | INTRAVENOUS | Status: AC
  Administered 2021-01-12: 2 g via INTRAVENOUS
  Filled 2021-01-12: qty 2

## 2021-01-12 MED ORDER — LACTATED RINGERS IV SOLN: INTRAVENOUS | Status: DC | PRN

## 2021-01-12 MED ORDER — SODIUM CHLORIDE 0.9 % IV SOLN
250.0000 mL | INTRAVENOUS | Status: DC
  Administered 2021-01-12: 250 mL via INTRAVENOUS

## 2021-01-12 MED ORDER — PANTOPRAZOLE SODIUM 40 MG IV SOLR
40.0000 mg | INTRAVENOUS | Status: DC
  Administered 2021-01-12 – 2021-01-15 (×4): 40 mg via INTRAVENOUS
  Filled 2021-01-12 (×4): qty 40

## 2021-01-12 MED ORDER — PHENYLEPHRINE 40 MCG/ML (10ML) SYRINGE FOR IV PUSH (FOR BLOOD PRESSURE SUPPORT)
PREFILLED_SYRINGE | INTRAVENOUS | Status: DC | PRN
  Administered 2021-01-12: 160 ug via INTRAVENOUS
  Administered 2021-01-12 (×2): 120 ug via INTRAVENOUS

## 2021-01-12 MED ORDER — LIDOCAINE 2% (20 MG/ML) 5 ML SYRINGE
INTRAMUSCULAR | Status: DC | PRN
  Administered 2021-01-12: 100 mg via INTRAVENOUS

## 2021-01-12 MED ORDER — CHLORHEXIDINE GLUCONATE CLOTH 2 % EX PADS
6.0000 | MEDICATED_PAD | Freq: Every day | CUTANEOUS | Status: DC
  Administered 2021-01-12 – 2021-01-16 (×4): 6 via TOPICAL

## 2021-01-12 MED ORDER — ALBUMIN HUMAN 25 % IV SOLN
25.0000 g | Freq: Four times a day (QID) | INTRAVENOUS | Status: AC
  Administered 2021-01-12 (×2): 25 g via INTRAVENOUS
  Filled 2021-01-12: qty 100

## 2021-01-12 MED ORDER — SUGAMMADEX SODIUM 200 MG/2ML IV SOLN
INTRAVENOUS | Status: DC | PRN
  Administered 2021-01-12: 340 mg via INTRAVENOUS

## 2021-01-12 MED ORDER — SODIUM CHLORIDE 0.9 % IV SOLN
1.0000 g | Freq: Two times a day (BID) | INTRAVENOUS | Status: DC
  Administered 2021-01-12 – 2021-01-15 (×7): 1 g via INTRAVENOUS
  Filled 2021-01-12 (×7): qty 1

## 2021-01-12 MED ORDER — ROCURONIUM BROMIDE 10 MG/ML (PF) SYRINGE
PREFILLED_SYRINGE | INTRAVENOUS | Status: DC | PRN
  Administered 2021-01-12: 40 mg via INTRAVENOUS

## 2021-01-12 MED ORDER — DEXAMETHASONE SODIUM PHOSPHATE 10 MG/ML IJ SOLN
INTRAMUSCULAR | Status: DC | PRN
  Administered 2021-01-12: 4 mg via INTRAVENOUS

## 2021-01-12 MED ORDER — VANCOMYCIN HCL 1500 MG/300ML IV SOLN
1500.0000 mg | INTRAVENOUS | Status: AC
  Administered 2021-01-12: 1500 mg via INTRAVENOUS
  Filled 2021-01-12: qty 300

## 2021-01-12 MED ORDER — SODIUM CHLORIDE 0.9 % IV SOLN
INTRAVENOUS | Status: DC | PRN
  Administered 2021-01-12: 35 mL

## 2021-01-12 MED ORDER — SCOPOLAMINE 1 MG/3DAYS TD PT72
MEDICATED_PATCH | TRANSDERMAL | Status: DC | PRN
  Administered 2021-01-12: 1 via TRANSDERMAL

## 2021-01-12 MED ORDER — INDOMETHACIN 50 MG RE SUPP
RECTAL | Status: AC
  Filled 2021-01-12: qty 2

## 2021-01-12 MED ORDER — ACETAMINOPHEN 325 MG PO TABS
650.0000 mg | ORAL_TABLET | Freq: Once | ORAL | Status: AC
  Administered 2021-01-12: 650 mg via ORAL
  Filled 2021-01-12: qty 2

## 2021-01-12 MED ORDER — LACTATED RINGERS IV BOLUS
1000.0000 mL | Freq: Once | INTRAVENOUS | Status: AC
  Administered 2021-01-12: 1000 mL via INTRAVENOUS

## 2021-01-12 MED ORDER — INSULIN ASPART 100 UNIT/ML ~~LOC~~ SOLN
0.0000 [IU] | SUBCUTANEOUS | Status: DC
  Administered 2021-01-12 (×3): 2 [IU] via SUBCUTANEOUS
  Administered 2021-01-12: 1 [IU] via SUBCUTANEOUS
  Administered 2021-01-13: 2 [IU] via SUBCUTANEOUS
  Administered 2021-01-13: 1 [IU] via SUBCUTANEOUS
  Administered 2021-01-13 (×2): 2 [IU] via SUBCUTANEOUS
  Administered 2021-01-13 (×2): 1 [IU] via SUBCUTANEOUS
  Administered 2021-01-14: 2 [IU] via SUBCUTANEOUS
  Administered 2021-01-14: 1 [IU] via SUBCUTANEOUS
  Administered 2021-01-14: 2 [IU] via SUBCUTANEOUS
  Administered 2021-01-14: 1 [IU] via SUBCUTANEOUS
  Administered 2021-01-15 (×3): 2 [IU] via SUBCUTANEOUS
  Administered 2021-01-15: 1 [IU] via SUBCUTANEOUS

## 2021-01-12 MED ORDER — ONDANSETRON HCL 4 MG PO TABS: 4.0000 mg | ORAL_TABLET | Freq: Four times a day (QID) | ORAL | Status: DC | PRN

## 2021-01-12 MED ORDER — FENTANYL CITRATE (PF) 100 MCG/2ML IJ SOLN
INTRAMUSCULAR | Status: AC
  Filled 2021-01-12: qty 2

## 2021-01-12 MED ORDER — ORAL CARE MOUTH RINSE
15.0000 mL | Freq: Two times a day (BID) | OROMUCOSAL | Status: DC
  Administered 2021-01-12 – 2021-01-16 (×9): 15 mL via OROMUCOSAL

## 2021-01-12 MED ORDER — NOREPINEPHRINE 4 MG/250ML-% IV SOLN
2.0000 ug/min | INTRAVENOUS | Status: DC
  Administered 2021-01-12: 2 ug/min via INTRAVENOUS
  Filled 2021-01-12: qty 250

## 2021-01-12 MED ORDER — SODIUM CHLORIDE 0.9% FLUSH
10.0000 mL | Freq: Two times a day (BID) | INTRAVENOUS | Status: DC
  Administered 2021-01-12 – 2021-01-16 (×8): 10 mL via INTRAVENOUS

## 2021-01-12 MED ORDER — PROPOFOL 10 MG/ML IV BOLUS
INTRAVENOUS | Status: DC | PRN
  Administered 2021-01-12: 160 mg via INTRAVENOUS

## 2021-01-12 MED ORDER — HEPARIN SODIUM (PORCINE) 5000 UNIT/ML IJ SOLN
5000.0000 [IU] | Freq: Three times a day (TID) | INTRAMUSCULAR | Status: DC
  Administered 2021-01-12 – 2021-01-17 (×16): 5000 [IU] via SUBCUTANEOUS
  Filled 2021-01-12 (×16): qty 1

## 2021-01-12 MED ORDER — INDOMETHACIN 50 MG RE SUPP
RECTAL | Status: DC | PRN
  Administered 2021-01-12: 100 mg via RECTAL

## 2021-01-12 MED ORDER — GLUCAGON HCL RDNA (DIAGNOSTIC) 1 MG IJ SOLR
INTRAMUSCULAR | Status: AC
  Filled 2021-01-12: qty 1

## 2021-01-12 MED ORDER — FENTANYL CITRATE (PF) 100 MCG/2ML IJ SOLN
INTRAMUSCULAR | Status: DC | PRN
  Administered 2021-01-12: 100 ug via INTRAVENOUS

## 2021-01-12 MED ORDER — SODIUM CHLORIDE 0.9 % IV SOLN: INTRAVENOUS | Status: AC

## 2021-01-12 NOTE — Progress Notes (Signed)
PHARMACY - PHYSICIAN COMMUNICATION CRITICAL VALUE ALERT - BLOOD CULTURE IDENTIFICATION (BCID)  Tina Briggs is an 75 y.o. female who presented to [facility] on 01/11/2021 with a chief complaint of RUQ pain, febrile, nauseated. Meropenem begun 4/15 am.  Assessment:  1 of 3 bottles previously noted with GNR & GPC - Micro lab noted that E coli and Klebsiella oxytoca identified, no resistance noted at this time. Will also remove the GPC from report -noted that staining did not identify a gram positive organism.   Name of physician (or Provider) Contacted: [place]  Current antibiotics: Meropenem 1gm q12  Changes to prescribed antibiotics recommended: None at this time   Results for orders placed or performed during the hospital encounter of 01/11/21  Blood Culture ID Panel (Reflexed) (Collected: 01/11/2021 11:41 PM)  Result Value Ref Range   Enterococcus faecalis NOT DETECTED NOT DETECTED   Enterococcus Faecium NOT DETECTED NOT DETECTED   Listeria monocytogenes NOT DETECTED NOT DETECTED   Staphylococcus species NOT DETECTED NOT DETECTED   Staphylococcus aureus (BCID) NOT DETECTED NOT DETECTED   Staphylococcus epidermidis NOT DETECTED NOT DETECTED   Staphylococcus lugdunensis NOT DETECTED NOT DETECTED   Streptococcus species NOT DETECTED NOT DETECTED   Streptococcus agalactiae NOT DETECTED NOT DETECTED   Streptococcus pneumoniae NOT DETECTED NOT DETECTED   Streptococcus pyogenes NOT DETECTED NOT DETECTED   A.calcoaceticus-baumannii NOT DETECTED NOT DETECTED   Bacteroides fragilis NOT DETECTED NOT DETECTED   Enterobacterales DETECTED (A) NOT DETECTED   Enterobacter cloacae complex NOT DETECTED NOT DETECTED   Escherichia coli DETECTED (A) NOT DETECTED   Klebsiella aerogenes NOT DETECTED NOT DETECTED   Klebsiella oxytoca DETECTED (A) NOT DETECTED   Klebsiella pneumoniae NOT DETECTED NOT DETECTED   Proteus species NOT DETECTED NOT DETECTED   Salmonella species NOT DETECTED NOT  DETECTED   Serratia marcescens NOT DETECTED NOT DETECTED   Haemophilus influenzae NOT DETECTED NOT DETECTED   Neisseria meningitidis NOT DETECTED NOT DETECTED   Pseudomonas aeruginosa NOT DETECTED NOT DETECTED   Stenotrophomonas maltophilia NOT DETECTED NOT DETECTED   Candida albicans NOT DETECTED NOT DETECTED   Candida auris NOT DETECTED NOT DETECTED   Candida glabrata NOT DETECTED NOT DETECTED   Candida krusei NOT DETECTED NOT DETECTED   Candida parapsilosis NOT DETECTED NOT DETECTED   Candida tropicalis NOT DETECTED NOT DETECTED   Cryptococcus neoformans/gattii NOT DETECTED NOT DETECTED   CTX-M ESBL NOT DETECTED NOT DETECTED   Carbapenem resistance IMP NOT DETECTED NOT DETECTED   Carbapenem resistance KPC NOT DETECTED NOT DETECTED   Carbapenem resistance NDM NOT DETECTED NOT DETECTED   Carbapenem resist OXA 48 LIKE NOT DETECTED NOT DETECTED   Carbapenem resistance VIM NOT DETECTED NOT DETECTED    Minda Ditto PharmD 01/12/2021  5:23 PM

## 2021-01-12 NOTE — ED Notes (Addendum)
IN & OUT CATH: 657ml output  If patient has to be cath. Again MD recommended foley.

## 2021-01-12 NOTE — Op Note (Signed)
[facility] Patient Name: Tina Briggs Procedure Date: 01/12/2021 MRN: [id number] Attending MD: Clarene Essex , MD Date of Birth: [birth year] CSN: [id number] Age: 75 Admit Type: Inpatient Procedure:                ERCP Indications:              Suspected bile duct stone(s), Suspected ascending                            cholangitis Providers:                Clarene Essex, MD, Josie Dixon, RN, Cherylynn Ridges,                            Technician Referring MD:              Medicines:                General Anesthesia Complications:            No immediate complications. Estimated Blood Loss:     Estimated blood loss: none. Procedure:                Pre-Anesthesia Assessment:                           - Prior to the procedure, a History and Physical                            was performed, and patient medications and                            allergies were reviewed. The patient's tolerance of                            previous anesthesia was also reviewed. The risks                            and benefits of the procedure and the sedation                            options and risks were discussed with the patient.                            All questions were answered, and informed consent                            was obtained. Prior Anticoagulants: The patient has                            taken no previous anticoagulant or antiplatelet                            agents except for aspirin. ASA Grade Assessment:                            III - A patient with severe systemic disease.  After                            reviewing the risks and benefits, the patient was                            deemed in satisfactory condition to undergo the                            procedure.                           After obtaining informed consent, the scope was                            passed under direct vision. Throughout the                            procedure, the  patient's blood pressure, pulse, and                            oxygen saturations were monitored continuously. The                            ERCP [id number] was introduced through the mouth, and                            used to inject contrast into and used to cannulate                            the bile duct. The ERCP was somewhat difficult due                            to Trying to pass the wire into both the right and                            left system. Successful completion of the procedure                            was aided by performing the maneuvers documented                            (below) in this report. The patient tolerated the                            procedure well. Scope In: Scope Out: Findings:      A biliary sphincterotomy had been performed. The ampulla was hidden       behind some folds and the patient did have a few small diverticulum but       deep selective cannulation was readily obtained once the ampulla was       found and the previous sphincterotomy appeared open. And we were able to       get the fully bowed sphincterotome easily in and out of the duct  and the       sphincterotome was exchanged for the adjustable balloon and the wire was       initially advanced into the right system and the biliary tree was swept       with an adjustable 12- 15 mm balloon starting at the upper third of the       main bile duct, middle third of the main bile duct, lower third of the       main duct, left main hepatic duct and right main hepatic duct. Sludge       was swept from the duct. All stones were removed. We swept both upper       ducts with the 12 mm balloon and both the 12 and the 15 mm balloon       passed readily through the patent sphincterotomy site and as above we       did manipulate the wire into both systems and proceed with a few       occlusion cholangiograms and nothing was found at the end of the       procedure on multiple balloon  pull-through's and on occlusion       cholangiogram and there was sluggish biliary drainage and we elected to       end the procedure at this point Impression:               - Prior biliary sphincterotomy appeared open.                           - Choledocholithiasis was found. Complete removal                            was accomplished by balloon extraction.                           - The biliary tree was swept and nothing was found. Moderate Sedation:      Not Applicable - Patient had care per Anesthesia. Recommendation:           - Clear liquid diet for 6 hours. If doing well this                            evening may have soft solids                           - Continue present medications.                           - Return to GI clinic PRN. But happy to see back as                            an outpatient in 1 to 2 weeks to recheck liver                            tests and symptoms and make sure no further work-up                            and plans are needed and will follow with you for  now                           - Telephone GI clinic if symptomatic PRN.                           - Check liver enzymes (AST, ALT, alkaline                            phosphatase, bilirubin) and hemogram with white                            blood cell count and platelets in the morning.                            Await blood cultures to determine proper antibiotic                            use and length of time Procedure Code(s):        --- Professional ---                           [phone], Endoscopic retrograde                            cholangiopancreatography (ERCP); with removal of                            calculi/debris from biliary/pancreatic duct(s) Diagnosis Code(s):        --- Professional ---                           K80.50, Calculus of bile duct without cholangitis                            or cholecystitis without obstruction CPT copyright 2019  [employer]. All rights reserved. The codes documented in this report are preliminary and upon coder review may  be revised to meet current compliance requirements. Clarene Essex, MD 01/12/2021 10:42:21 AM This report has been signed electronically. Number of Addenda: 0

## 2021-01-12 NOTE — Sepsis Progress Note (Signed)
Following for Code Sepsis  

## 2021-01-12 NOTE — Progress Notes (Signed)
A consult was received from an ED physician for Vancomycin and Cefepime per pharmacy dosing.  The patient's profile has been reviewed for ht/wt/allergies/indication/available labs.    A one time order has been placed for Vancomycin 1500mg  IV and Cefepime 2gm IV.    Further antibiotics/pharmacy consults should be ordered by admitting physician if indicated.                       Thank you, Everette Rank, PharmD 01/12/2021  12:16 AM

## 2021-01-12 NOTE — Anesthesia Postprocedure Evaluation (Signed)
Anesthesia Post Note  Patient: STEWART PIMENTA  Procedure(s) Performed: ENDOSCOPIC RETROGRADE CHOLANGIOPANCREATOGRAPHY (ERCP) (N/A ) REMOVAL OF STONES     Patient location during evaluation: Endoscopy Anesthesia Type: General Level of consciousness: awake and alert Pain management: pain level controlled Vital Signs Assessment: post-procedure vital signs reviewed and stable Respiratory status: spontaneous breathing, nonlabored ventilation and respiratory function stable Cardiovascular status: blood pressure returned to baseline and stable Postop Assessment: no apparent nausea or vomiting Anesthetic complications: no   No complications documented.  Last Vitals:  Vitals:   01/12/21 0934 01/12/21 1200  BP: 131/65 (!) 88/49  Pulse: 65 63  Resp: 18 12  Temp: 36.8 C   SpO2: 99% 100%    Last Pain:  Vitals:   01/12/21 0934  TempSrc: Oral  PainSc: 0-No pain                 Lidia Collum

## 2021-01-12 NOTE — Transfer of Care (Signed)
Immediate Anesthesia Transfer of Care Note  Patient: Tina Briggs  Procedure(s) Performed: ENDOSCOPIC RETROGRADE CHOLANGIOPANCREATOGRAPHY (ERCP) (N/A ) REMOVAL OF STONES  Patient Location: ICU  Anesthesia Type:General  Level of Consciousness: awake, alert  and oriented  Airway & Oxygen Therapy: Patient Spontanous Breathing and Patient connected to face mask oxygen  Post-op Assessment: Report given to RN, Post -op Vital signs reviewed and stable and Patient moving all extremities X 4  Post vital signs: Reviewed and stable  Last Vitals:  Vitals Value Taken Time  BP    Temp    Pulse    Resp    SpO2      Last Pain:  Vitals:   01/12/21 0934  TempSrc: Oral  PainSc: 0-No pain         Complications: No complications documented.

## 2021-01-12 NOTE — Progress Notes (Signed)
PROGRESS NOTE    Tina Briggs  BOF:[id number] DOB: [birth year] DOA: 01/11/2021 PCP: Wenda Low, MD   Brief Narrative:  Per admitting physician: Tina Briggs is a 75 y.o. female with medical history significant for chronic kidney disease, type 2 diabetes mellitus, hypertension, remote cholecystectomy, and choledocholithiasis in 2016 status post ERCP with sphincterotomy and stone extraction, now presenting to emergency department with right upper quadrant pain, fevers, nausea, vomiting, and confusion.  The patient reports that she developed right upper quadrant pain and nausea 1 week ago which resolved before returning again and then becoming more constant and severe over the past 3 days.  She has not eaten in the past day due to nausea and has developed progressive pain, fevers, lethargy, and confusion.  She felt too weak to sit up last night and EMS was called.  She was found to have temperature of 103.9 F.  She was given 1 L of IV fluids by EMS prior to arrival in the ED.  She denies any chest pain, cough, shortness of breath, or diarrhea.  In the ER patient was noted to have clinical findings concerning for ascending cholangitis.  She was placed on broad-spectrum antibiotics, blood cultures were collected, fluids were administered, GI was informed, with plans for ERCP    Assessment & Plan:   Principal Problem:   Ascending cholangitis Active Problems:   Essential hypertension   Diabetes mellitus type 2 in obese (HCC)   Chronic kidney disease, stage 3 (HCC)   Sepsis with acute hypoxic respiratory failure and septic shock (HCC)   Acute encephalopathy   Thrombocytopenia (HCC)   Acute respiratory failure with hypoxia ([place])   1. Acute cholangitis; septic shock    - Presents with RUQ pain, fevers, confusion, and found on CT to have calculi in central biliary tree on left with intrahepatic biliary dilation on left and elevated LFTs in hepatocellular pattern  - She was given 1  liter IVF with EMS  - Blood cultures collected in ED, 2 more liters IVF given, and meropenem started  - Confusion resolved and BP was improving but trended back down; she has received 3 liters of IVF, has new supplemental O2 requirement, and developing interstitial edema noted on CT  - Appreciate Dr. Paulita Fujita of GI, planning to see her this am  if inpatient now status post RCP with removal of stone - Continue meropenem, continue albumin, started norepinephrine but has been since weaned off    2. Renally insufficiency  - SCr is 1.70 on admission, up from 1.05 in January 2020  - Progression in CKD vs AKI  - Hold ARB, renally-dose medications, monitor    3. Acute encephalopathy  - Resolved  - Presented with confusion which was likely secondary to acute infection and has resolved    4. Thrombocytopenia  - Platelets 110k on admission  now 95 - Likely secondary to infection  - Treat infection and monitor    5. Hypertension  - Now hypotensive  - Hold antihypertensives    6. Type II DM  - No recent A1c in EMR  - Check CBGs and use a low-intensity SSI   7. Acute hypoxic respiratory failure  - Patient denies SOB, chest pain, or cough but now requiring 4 Lpm supplemental O2 and has developing edema on chest CT  - EF was normal on remote echo  - Cautious with IVF, update echo, continue supplemental O2 as needed      DVT prophylaxis: Heparin SQ  Code Status:  full    Code Status Orders  (From admission, onward)         Start     Ordered   01/12/21 0612  Full code  Continuous        01/12/21 0613        Code Status History    Date Active Date Inactive Code Status Order ID Comments User Context   10/15/2018 0936 10/20/2018 1349 Full Code [id number]  Karmen Bongo, MD ED   09/16/2018 1809 09/17/2018 1457 Full Code [id number]  Eustace Moore, MD Inpatient   12/31/2017 1941 01/01/2018 1804 Full Code [id number]  Eustace Moore, MD Inpatient   06/02/2015 1744 06/07/2015 1432 Full Code  [id number]  Hosie Poisson, MD Inpatient   05/24/2015 2214 05/27/2015 1703 Full Code [id number]  Theressa Millard, MD Inpatient   Advance Care Planning Activity    Advance Directive Documentation   Flowsheet Row Most Recent Value  Type of Advance Directive Healthcare Power of Attorney  Pre-existing out of facility DNR order (yellow form or pink MOST form) --  "MOST" Form in Place? --     Family Communication: discussed with her husband ronnie, answered all questions Disposition Plan:   Patient remained inpatient currently recovering postoperatively, requiring IV antibiotics, IV fluids, weaning pressors.  Patient not yet stable for discharge Consults called: GI, critical care Admission status: Inpatient   Consultants:   As above  Procedures:  CT ABDOMEN PELVIS WO CONTRAST  Result Date: 01/12/2021 CLINICAL DATA:  Dyspnea, fever, sepsis, glomerulonephritis EXAM: CT CHEST, ABDOMEN AND PELVIS WITHOUT CONTRAST TECHNIQUE: Multidetector CT imaging of the chest, abdomen and pelvis was performed following the standard protocol without IV contrast. COMPARISON:  05/26/2020 FINDINGS: CT CHEST FINDINGS Cardiovascular: Mild coronary artery calcification. Global cardiac size within normal limits. No pericardial effusion. The central pulmonary arteries are of normal caliber. Mild atherosclerotic calcification within the thoracic aorta. No aortic aneurysm. Mediastinum/Nodes: The visualized thyroid is unremarkable. No pathologic thoracic adenopathy. Moderate hiatal hernia. The esophagus demonstrates an air-fluid level which may relate to gastroesophageal reflux or esophageal dysmotility. Lungs/Pleura: Mild bibasilar atelectasis. Trace interstitial pulmonary edema with smooth interlobular septal thickening at the lung bases and apices. No confluent pulmonary infiltrate. No pneumothorax or pleural effusion. The central airways are widely patent. Musculoskeletal: No acute bone abnormality. No suspicious lytic or  blastic bone lesion. CT ABDOMEN PELVIS FINDINGS Hepatobiliary: Status post cholecystectomy. Mild pneumobilia suggests prior sphincterotomy. There is, however, hyperdensity noted within the central biliary tree, better appreciated on coronal imaging representing a intraluminal calculus within the central left hepatic bile ducts measuring 10 mm x 18 mm. There is mild left intrahepatic biliary ductal dilation and fluid opacification of these ducts in keeping with an obstructed biliary tree. There is persistent pneumobilia within the a right hepatic ducts indicating patency of this portion of the biliary tree. The liver is otherwise unremarkable. Pancreas: Unremarkable Spleen: Unremarkable Adrenals/Urinary Tract: The adrenal glands are unremarkable. The kidneys are normal in size and position. No intrarenal or ureteral calculi. No hydronephrosis. The bladder is mildly distended, but is otherwise unremarkable. Stomach/Bowel: Extensive descending and sigmoid colonic diverticulosis. No superimposed inflammatory change. The stomach, small bowel, and large bowel are otherwise unremarkable. Appendix absent. No free intraperitoneal fluid or gas. Vascular/Lymphatic: Mild atherosclerotic calcification within the thoracic aorta. No aortic aneurysm. No pathologic adenopathy within the abdomen and pelvis. Reproductive: Status post hysterectomy. No adnexal masses. Other: Tiny fat containing umbilical hernia.  Rectum unremarkable. Musculoskeletal: No lytic or blastic bone lesion. L4-5 fusion  with instrumentation has been performed. No acute bone abnormality. IMPRESSION: Intraluminal calculi within the central left biliary tree with distal fluid opacification and dilation of the intrahepatic bile ducts within the left hepatic lobe. This may manifest clinically as sepsis secondary to ascending cholangitis. Mild coronary artery calcification. Trace interstitial pulmonary edema. Extensive distal colonic diverticulosis Aortic  Atherosclerosis (ICD10-I70.0). Electronically Signed   By: Fidela Salisbury MD   On: 01/12/2021 02:44   CT CHEST WO CONTRAST  Result Date: 01/12/2021 CLINICAL DATA:  Dyspnea, fever, sepsis, glomerulonephritis EXAM: CT CHEST, ABDOMEN AND PELVIS WITHOUT CONTRAST TECHNIQUE: Multidetector CT imaging of the chest, abdomen and pelvis was performed following the standard protocol without IV contrast. COMPARISON:  05/26/2020 FINDINGS: CT CHEST FINDINGS Cardiovascular: Mild coronary artery calcification. Global cardiac size within normal limits. No pericardial effusion. The central pulmonary arteries are of normal caliber. Mild atherosclerotic calcification within the thoracic aorta. No aortic aneurysm. Mediastinum/Nodes: The visualized thyroid is unremarkable. No pathologic thoracic adenopathy. Moderate hiatal hernia. The esophagus demonstrates an air-fluid level which may relate to gastroesophageal reflux or esophageal dysmotility. Lungs/Pleura: Mild bibasilar atelectasis. Trace interstitial pulmonary edema with smooth interlobular septal thickening at the lung bases and apices. No confluent pulmonary infiltrate. No pneumothorax or pleural effusion. The central airways are widely patent. Musculoskeletal: No acute bone abnormality. No suspicious lytic or blastic bone lesion. CT ABDOMEN PELVIS FINDINGS Hepatobiliary: Status post cholecystectomy. Mild pneumobilia suggests prior sphincterotomy. There is, however, hyperdensity noted within the central biliary tree, better appreciated on coronal imaging representing a intraluminal calculus within the central left hepatic bile ducts measuring 10 mm x 18 mm. There is mild left intrahepatic biliary ductal dilation and fluid opacification of these ducts in keeping with an obstructed biliary tree. There is persistent pneumobilia within the a right hepatic ducts indicating patency of this portion of the biliary tree. The liver is otherwise unremarkable. Pancreas: Unremarkable  Spleen: Unremarkable Adrenals/Urinary Tract: The adrenal glands are unremarkable. The kidneys are normal in size and position. No intrarenal or ureteral calculi. No hydronephrosis. The bladder is mildly distended, but is otherwise unremarkable. Stomach/Bowel: Extensive descending and sigmoid colonic diverticulosis. No superimposed inflammatory change. The stomach, small bowel, and large bowel are otherwise unremarkable. Appendix absent. No free intraperitoneal fluid or gas. Vascular/Lymphatic: Mild atherosclerotic calcification within the thoracic aorta. No aortic aneurysm. No pathologic adenopathy within the abdomen and pelvis. Reproductive: Status post hysterectomy. No adnexal masses. Other: Tiny fat containing umbilical hernia.  Rectum unremarkable. Musculoskeletal: No lytic or blastic bone lesion. L4-5 fusion with instrumentation has been performed. No acute bone abnormality. IMPRESSION: Intraluminal calculi within the central left biliary tree with distal fluid opacification and dilation of the intrahepatic bile ducts within the left hepatic lobe. This may manifest clinically as sepsis secondary to ascending cholangitis. Mild coronary artery calcification. Trace interstitial pulmonary edema. Extensive distal colonic diverticulosis Aortic Atherosclerosis (ICD10-I70.0). Electronically Signed   By: Fidela Salisbury MD   On: 01/12/2021 02:44   DG Chest Port 1 View  Result Date: 01/12/2021 CLINICAL DATA:  Vomiting and fever EXAM: PORTABLE CHEST 1 VIEW COMPARISON:  12/25/2017 FINDINGS: The heart size and mediastinal contours are within normal limits. Both lungs are clear. The visualized skeletal structures are unremarkable. IMPRESSION: No active disease. Electronically Signed   By: Ulyses Jarred M.D.   On: 01/12/2021 00:17   DG ERCP BILIARY & PANCREATIC DUCTS  Result Date: 01/12/2021 CLINICAL DATA:  Choledocholithiasis EXAM: ERCP TECHNIQUE: Multiple spot images obtained with the fluoroscopic device and  submitted for interpretation post-procedure.  FLUOROSCOPY TIME:  Fluoroscopy Time:  5 minutes 32 seconds Radiation Exposure Index (if provided by the fluoroscopic device): 124.42 mGy COMPARISON:  CT abdomen/pelvis 01/12/2021 FINDINGS: Five intraoperative saved images are submitted for review. The images demonstrate a flexible endoscope in the descending duodenum with wire cannulation of the right intrahepatic ducts. Cholangiogram demonstrates mild biliary ductal dilatation. Filling defects in the distal common bile duct consistent with choledocholithiasis. Subsequent images demonstrate sphincterotomy and balloon sweep of the common duct. IMPRESSION: 1. Choledocholithiasis. 2. ERCP with sphincterotomy and balloon sweeping of the common duct. These images were submitted for radiologic interpretation only. Please see the procedural report for the amount of contrast and the fluoroscopy time utilized. Electronically Signed   By: Jacqulynn Cadet M.D.   On: 01/12/2021 10:49     Antimicrobials:   Meropenem   Subjective: Patient now status post ERCP somewhat lethargic from anesthesia Discussed with husband plan of care Patient is improving  Objective: Vitals:   01/12/21 0615 01/12/21 0630 01/12/21 0800 01/12/21 0934  BP: (!) 80/41 (!) 84/41  131/65  Pulse: 79 80  65  Resp: 19 17  18   Temp:   98 F (36.7 C) 98.3 F (36.8 C)  TempSrc:   Oral Oral  SpO2: 97% 97%  99%  Weight:    85.1 kg  Height:    5\' 3"  (1.6 m)    Intake/Output Summary (Last 24 hours) at 01/12/2021 1148 Last data filed at 01/12/2021 1055 Gross per 24 hour  Intake 2700 ml  Output 625 ml  Net 2075 ml   Filed Weights   01/12/21 0012 01/12/21 0600 01/12/21 0934  Weight: 80.7 kg 85.1 kg 85.1 kg    Examination:  General exam: Appears calm and comfortable  Respiratory system: Clear to auscultation. Respiratory effort normal. Cardiovascular system: S1 & S2 heard, RRR. No JVD, murmurs, rubs, gallops or clicks. No pedal  edema. Gastrointestinal system: Abdomen is nondistended, soft and nontender although patient is post procedure with anesthesia on board still. No organomegaly or masses felt. Normal bowel sounds heard. Central nervous system: Alert No focal neurological deficits. Extremities: Warm well perfused, no edema Skin: No rashes, lesions or ulcers Psychiatry: No acute decompensation,.     Data Reviewed: I have personally reviewed following labs and imaging studies  CBC: Recent Labs  Lab 01/11/21 2336 01/12/21 0742  WBC 9.5 16.3*  NEUTROABS 8.7* 13.9*  HGB 12.2 10.4*  HCT 37.5 31.3*  MCV 91.7 92.1  PLT 110* 98*   Basic Metabolic Panel: Recent Labs  Lab 01/11/21 2336 01/12/21 0742  NA 141 143  K 4.1 3.4*  CL 113* 116*  CO2 20* 17*  GLUCOSE 154* 144*  BUN 33* 37*  CREATININE 1.70* 1.68*  CALCIUM 8.3* 8.0*   GFR: Estimated Creatinine Clearance: 30.4 mL/min (A) (by C-G formula based on SCr of 1.68 mg/dL (H)). Liver Function Tests: Recent Labs  Lab 01/11/21 2336 01/12/21 0742  AST 330* 176*  ALT 425* 272*  ALKPHOS 102 81  BILITOT 1.3* 0.9  PROT 5.8* 5.3*  ALBUMIN 3.1* 2.9*   Recent Labs  Lab 01/11/21 2336  LIPASE 39   No results for input(s): AMMONIA in the last 168 hours. Coagulation Profile: Recent Labs  Lab 01/11/21 2336  INR 1.2   Cardiac Enzymes: No results for input(s): CKTOTAL, CKMB, CKMBINDEX, TROPONINI in the last 168 hours. BNP (last 3 results) No results for input(s): PROBNP in the last 8760 hours. HbA1C: No results for input(s): HGBA1C in the last 72 hours.  CBG: Recent Labs  Lab 01/12/21 0302 01/12/21 0829  GLUCAP 136* 137*   Lipid Profile: No results for input(s): CHOL, HDL, LDLCALC, TRIG, CHOLHDL, LDLDIRECT in the last 72 hours. Thyroid Function Tests: No results for input(s): TSH, T4TOTAL, FREET4, T3FREE, THYROIDAB in the last 72 hours. Anemia Panel: No results for input(s): VITAMINB12, FOLATE, FERRITIN, TIBC, IRON, RETICCTPCT in the  last 72 hours. Sepsis Labs: Recent Labs  Lab 01/11/21 2336 01/12/21 0136 01/12/21 0742  PROCALCITON  --   --  53.72  LATICACIDVEN 2.6* 1.0  --     Recent Results (from the past 240 hour(s))  Resp Panel by RT-PCR (Flu A&B, Covid) Nasopharyngeal Swab     Status: None   Collection Time: 01/11/21 11:36 PM   Specimen: Nasopharyngeal Swab; Nasopharyngeal(NP) swabs in vial transport medium  Result Value Ref Range Status   SARS Coronavirus 2 by RT PCR NEGATIVE NEGATIVE Final    Comment: (NOTE) SARS-CoV-2 target nucleic acids are NOT DETECTED.  The SARS-CoV-2 RNA is generally detectable in upper respiratory specimens during the acute phase of infection. The lowest concentration of SARS-CoV-2 viral copies this assay can detect is 138 copies/mL. A negative result does not preclude SARS-Cov-2 infection and should not be used as the sole basis for treatment or other patient management decisions. A negative result may occur with  improper specimen collection/handling, submission of specimen other than nasopharyngeal swab, presence of viral mutation(s) within the areas targeted by this assay, and inadequate number of viral copies(<138 copies/mL). A negative result must be combined with clinical observations, patient history, and epidemiological information. The expected result is Negative.  Fact Sheet for Patients:  EntrepreneurPulse.com.au  Fact Sheet for Healthcare Providers:  IncredibleEmployment.be  This test is no t yet approved or cleared by the [place] FDA and  has been authorized for detection and/or diagnosis of SARS-CoV-2 by FDA under an Emergency Use Authorization (EUA). This EUA will remain  in effect (meaning this test can be used) for the duration of the COVID-19 declaration under Section 564(b)(1) of the Act, 21 U.S.C.section 360bbb-3(b)(1), unless the authorization is terminated  or revoked sooner.       Influenza A by PCR  NEGATIVE NEGATIVE Final   Influenza B by PCR NEGATIVE NEGATIVE Final    Comment: (NOTE) The Xpert Xpress SARS-CoV-2/FLU/RSV plus assay is intended as an aid in the diagnosis of influenza from Nasopharyngeal swab specimens and should not be used as a sole basis for treatment. Nasal washings and aspirates are unacceptable for Xpert Xpress SARS-CoV-2/FLU/RSV testing.  Fact Sheet for Patients: EntrepreneurPulse.com.au  Fact Sheet for Healthcare Providers: IncredibleEmployment.be  This test is not yet approved or cleared by the [place] FDA and has been authorized for detection and/or diagnosis of SARS-CoV-2 by FDA under an Emergency Use Authorization (EUA). This EUA will remain in effect (meaning this test can be used) for the duration of the COVID-19 declaration under Section 564(b)(1) of the Act, 21 U.S.C. section 360bbb-3(b)(1), unless the authorization is terminated or revoked.  Performed at [facility], [place] [address], [place],  [address]   MRSA PCR Screening     Status: None   Collection Time: 01/12/21  6:04 AM   Specimen: Nasal Mucosa; Nasopharyngeal  Result Value Ref Range Status   MRSA by PCR NEGATIVE NEGATIVE Final    Comment:        The GeneXpert MRSA Assay (FDA approved for NASAL specimens only), is one component of a comprehensive MRSA colonization surveillance program. It  is not intended to diagnose MRSA infection nor to guide or monitor treatment for MRSA infections. Performed at [facility], [place] [address] [place], [place] [address]          Radiology Studies: CT ABDOMEN PELVIS WO CONTRAST  Result Date: 01/12/2021 CLINICAL DATA:  Dyspnea, fever, sepsis, glomerulonephritis EXAM: CT CHEST, ABDOMEN AND PELVIS WITHOUT CONTRAST TECHNIQUE: Multidetector CT imaging of the chest, abdomen and pelvis was performed following the standard protocol without IV contrast.  COMPARISON:  05/26/2020 FINDINGS: CT CHEST FINDINGS Cardiovascular: Mild coronary artery calcification. Global cardiac size within normal limits. No pericardial effusion. The central pulmonary arteries are of normal caliber. Mild atherosclerotic calcification within the thoracic aorta. No aortic aneurysm. Mediastinum/Nodes: The visualized thyroid is unremarkable. No pathologic thoracic adenopathy. Moderate hiatal hernia. The esophagus demonstrates an air-fluid level which may relate to gastroesophageal reflux or esophageal dysmotility. Lungs/Pleura: Mild bibasilar atelectasis. Trace interstitial pulmonary edema with smooth interlobular septal thickening at the lung bases and apices. No confluent pulmonary infiltrate. No pneumothorax or pleural effusion. The central airways are widely patent. Musculoskeletal: No acute bone abnormality. No suspicious lytic or blastic bone lesion. CT ABDOMEN PELVIS FINDINGS Hepatobiliary: Status post cholecystectomy. Mild pneumobilia suggests prior sphincterotomy. There is, however, hyperdensity noted within the central biliary tree, better appreciated on coronal imaging representing a intraluminal calculus within the central left hepatic bile ducts measuring 10 mm x 18 mm. There is mild left intrahepatic biliary ductal dilation and fluid opacification of these ducts in keeping with an obstructed biliary tree. There is persistent pneumobilia within the a right hepatic ducts indicating patency of this portion of the biliary tree. The liver is otherwise unremarkable. Pancreas: Unremarkable Spleen: Unremarkable Adrenals/Urinary Tract: The adrenal glands are unremarkable. The kidneys are normal in size and position. No intrarenal or ureteral calculi. No hydronephrosis. The bladder is mildly distended, but is otherwise unremarkable. Stomach/Bowel: Extensive descending and sigmoid colonic diverticulosis. No superimposed inflammatory change. The stomach, small bowel, and large bowel are  otherwise unremarkable. Appendix absent. No free intraperitoneal fluid or gas. Vascular/Lymphatic: Mild atherosclerotic calcification within the thoracic aorta. No aortic aneurysm. No pathologic adenopathy within the abdomen and pelvis. Reproductive: Status post hysterectomy. No adnexal masses. Other: Tiny fat containing umbilical hernia.  Rectum unremarkable. Musculoskeletal: No lytic or blastic bone lesion. L4-5 fusion with instrumentation has been performed. No acute bone abnormality. IMPRESSION: Intraluminal calculi within the central left biliary tree with distal fluid opacification and dilation of the intrahepatic bile ducts within the left hepatic lobe. This may manifest clinically as sepsis secondary to ascending cholangitis. Mild coronary artery calcification. Trace interstitial pulmonary edema. Extensive distal colonic diverticulosis Aortic Atherosclerosis (ICD10-I70.0). Electronically Signed   By: Fidela Salisbury MD   On: 01/12/2021 02:44   CT CHEST WO CONTRAST  Result Date: 01/12/2021 CLINICAL DATA:  Dyspnea, fever, sepsis, glomerulonephritis EXAM: CT CHEST, ABDOMEN AND PELVIS WITHOUT CONTRAST TECHNIQUE: Multidetector CT imaging of the chest, abdomen and pelvis was performed following the standard protocol without IV contrast. COMPARISON:  05/26/2020 FINDINGS: CT CHEST FINDINGS Cardiovascular: Mild coronary artery calcification. Global cardiac size within normal limits. No pericardial effusion. The central pulmonary arteries are of normal caliber. Mild atherosclerotic calcification within the thoracic aorta. No aortic aneurysm. Mediastinum/Nodes: The visualized thyroid is unremarkable. No pathologic thoracic adenopathy. Moderate hiatal hernia. The esophagus demonstrates an air-fluid level which may relate to gastroesophageal reflux or esophageal dysmotility. Lungs/Pleura: Mild bibasilar atelectasis. Trace interstitial pulmonary edema with smooth interlobular septal thickening at the lung bases and  apices.  No confluent pulmonary infiltrate. No pneumothorax or pleural effusion. The central airways are widely patent. Musculoskeletal: No acute bone abnormality. No suspicious lytic or blastic bone lesion. CT ABDOMEN PELVIS FINDINGS Hepatobiliary: Status post cholecystectomy. Mild pneumobilia suggests prior sphincterotomy. There is, however, hyperdensity noted within the central biliary tree, better appreciated on coronal imaging representing a intraluminal calculus within the central left hepatic bile ducts measuring 10 mm x 18 mm. There is mild left intrahepatic biliary ductal dilation and fluid opacification of these ducts in keeping with an obstructed biliary tree. There is persistent pneumobilia within the a right hepatic ducts indicating patency of this portion of the biliary tree. The liver is otherwise unremarkable. Pancreas: Unremarkable Spleen: Unremarkable Adrenals/Urinary Tract: The adrenal glands are unremarkable. The kidneys are normal in size and position. No intrarenal or ureteral calculi. No hydronephrosis. The bladder is mildly distended, but is otherwise unremarkable. Stomach/Bowel: Extensive descending and sigmoid colonic diverticulosis. No superimposed inflammatory change. The stomach, small bowel, and large bowel are otherwise unremarkable. Appendix absent. No free intraperitoneal fluid or gas. Vascular/Lymphatic: Mild atherosclerotic calcification within the thoracic aorta. No aortic aneurysm. No pathologic adenopathy within the abdomen and pelvis. Reproductive: Status post hysterectomy. No adnexal masses. Other: Tiny fat containing umbilical hernia.  Rectum unremarkable. Musculoskeletal: No lytic or blastic bone lesion. L4-5 fusion with instrumentation has been performed. No acute bone abnormality. IMPRESSION: Intraluminal calculi within the central left biliary tree with distal fluid opacification and dilation of the intrahepatic bile ducts within the left hepatic lobe. This may manifest  clinically as sepsis secondary to ascending cholangitis. Mild coronary artery calcification. Trace interstitial pulmonary edema. Extensive distal colonic diverticulosis Aortic Atherosclerosis (ICD10-I70.0). Electronically Signed   By: Fidela Salisbury MD   On: 01/12/2021 02:44   DG Chest Port 1 View  Result Date: 01/12/2021 CLINICAL DATA:  Vomiting and fever EXAM: PORTABLE CHEST 1 VIEW COMPARISON:  12/25/2017 FINDINGS: The heart size and mediastinal contours are within normal limits. Both lungs are clear. The visualized skeletal structures are unremarkable. IMPRESSION: No active disease. Electronically Signed   By: Ulyses Jarred M.D.   On: 01/12/2021 00:17   DG ERCP BILIARY & PANCREATIC DUCTS  Result Date: 01/12/2021 CLINICAL DATA:  Choledocholithiasis EXAM: ERCP TECHNIQUE: Multiple spot images obtained with the fluoroscopic device and submitted for interpretation post-procedure. FLUOROSCOPY TIME:  Fluoroscopy Time:  5 minutes 32 seconds Radiation Exposure Index (if provided by the fluoroscopic device): 124.42 mGy COMPARISON:  CT abdomen/pelvis 01/12/2021 FINDINGS: Five intraoperative saved images are submitted for review. The images demonstrate a flexible endoscope in the descending duodenum with wire cannulation of the right intrahepatic ducts. Cholangiogram demonstrates mild biliary ductal dilatation. Filling defects in the distal common bile duct consistent with choledocholithiasis. Subsequent images demonstrate sphincterotomy and balloon sweep of the common duct. IMPRESSION: 1. Choledocholithiasis. 2. ERCP with sphincterotomy and balloon sweeping of the common duct. These images were submitted for radiologic interpretation only. Please see the procedural report for the amount of contrast and the fluoroscopy time utilized. Electronically Signed   By: Jacqulynn Cadet M.D.   On: 01/12/2021 10:49        Scheduled Meds: . Chlorhexidine Gluconate Cloth  6 each Topical Daily  . heparin  5,000 Units  Subcutaneous Q8H  . insulin aspart  0-9 Units Subcutaneous Q4H  . mouth rinse  15 mL Mouth Rinse BID  . pantoprazole (PROTONIX) IV  40 mg Intravenous Q24H   Continuous Infusions: . sodium chloride 100 mL/hr at 01/12/21 0936  . sodium chloride    .  albumin human 25 g (01/12/21 0739)  . meropenem (MERREM) IV 1 g (01/12/21 0416)  . norepinephrine (LEVOPHED) Adult infusion Stopped (01/12/21 1039)     LOS: 0 days    Time spent: 69 min    Nicolette Bang, MD [facility]  If 7PM-7AM, please contact night-coverage  01/12/2021, 11:48 AM

## 2021-01-12 NOTE — Anesthesia Preprocedure Evaluation (Addendum)
Anesthesia Evaluation  Patient identified by MRN, date of birth, ID band Patient awake    Reviewed: Allergy & Precautions, NPO status , Patient's Chart, lab work & pertinent test results  History of Anesthesia Complications Negative for: history of anesthetic complications  Airway Mallampati: II  TM Distance: >3 FB Neck ROM: Full    Dental  (+) Dental Advisory Given   Pulmonary neg pulmonary ROS,    Pulmonary exam normal        Cardiovascular hypertension, Normal cardiovascular exam     Neuro/Psych Anxiety Depression negative neurological ROS     GI/Hepatic Neg liver ROS, GERD  ,Ascending cholangitis   Endo/Other  diabetes, Type 2, Insulin Dependent  Renal/GU Renal disease (CKD3)  negative genitourinary   Musculoskeletal negative musculoskeletal ROS (+)   Abdominal   Peds  Hematology  (+) anemia , plts 98   Anesthesia Other Findings  Presented with septic shock due to ascending cholangitis, apparent choledocholithiasis based on CT. Hypoxemic- improved with O2 via [place]. On very low dose levophed.  Normal stress test 2016  Reproductive/Obstetrics                            Anesthesia Physical Anesthesia Plan  ASA: III  Anesthesia Plan: General   Post-op Pain Management:    Induction: Intravenous and Rapid sequence  PONV Risk Score and Plan: 3 and Ondansetron, Dexamethasone, Treatment may vary due to age or medical condition and Midazolam  Airway Management Planned: Oral ETT  Additional Equipment: None  Intra-op Plan:   Post-operative Plan: Extubation in OR  Informed Consent: I have reviewed the patients History and Physical, chart, labs and discussed the procedure including the risks, benefits and alternatives for the proposed anesthesia with the patient or authorized representative who has indicated his/her understanding and acceptance.     Dental advisory given  Plan  Discussed with:   Anesthesia Plan Comments:         Anesthesia Quick Evaluation

## 2021-01-12 NOTE — H&P (Addendum)
History and Physical    ALEXZIA KASLER GHW:[id number] DOB: 1946/04/18 DOA: 01/11/2021  PCP: Wenda Low, MD   Patient coming from: Home   Chief Complaint: RUQ pain, fever, N/V, confusion   HPI: RHAPSODY WOLVEN is a 75 y.o. female with medical history significant for chronic kidney disease, type 2 diabetes mellitus, hypertension, remote cholecystectomy, and choledocholithiasis in 2016 status post ERCP with sphincterotomy and stone extraction, now presenting to emergency department with right upper quadrant pain, fevers, nausea, vomiting, and confusion.  The patient reports that she developed right upper quadrant pain and nausea 1 week ago which resolved before returning again and then becoming more constant and severe over the past 3 days.  She has not eaten in the past day due to nausea and has developed progressive pain, fevers, lethargy, and confusion.  She felt too weak to sit up last night and EMS was called.  She was found to have temperature of 103.9 F.  She was given 1 L of IV fluids by EMS prior to arrival in the ED.  She denies any chest pain, cough, shortness of breath, or diarrhea.  ED Course: Upon arrival to the ED, patient is found to be febrile to 38.7 C, saturating mid to upper 90s on 4 L/min of supplemental oxygen, and with systolic blood pressure in the 80s.  EKG features sinus rhythm with LAFB and chest x-rays negative for acute cardiopulmonary disease.  CT the abdomen and pelvis is concerning for intraluminal calculi within the central biliary tree on the left with dilated intrahepatic ducts on the left.  Chest CT notable for trace interstitial pulmonary edema.  Chemistry panel features a creatinine of 1.70, normal alkaline phosphatase, AST 330, ALT 425, and total bilirubin 1.3.  CBC with thrombocytopenia and mild left shift.  Initial lactic acid was 2.6.  Blood cultures were collected, 2 L of IV fluids administered, and patient was started on meropenem.  Review of  Systems:  All other systems reviewed and apart from HPI, are negative.  Past Medical History:  Diagnosis Date  . Anemia   . Aspiration pneumonia ([place])    after common bile duct obstruction  . CATARACT, RIGHT EYE    had repaired  . Depression   . Diverticulitis    recurrent episodes  . DM   . GERD   . 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 Mild to mod TI scarring. (Dr. Lorrene Reid)  . HNP (herniated nucleus pulposus), lumbar    Recurrent  . HYPERLIPIDEMIA-MIXED   . HYPERTENSION, UNSPECIFIED   . OBESITY   . PONV (postoperative nausea and vomiting)    past history only, "sometimes they put a patch on me"    Past Surgical History:  Procedure Laterality Date  . ABDOMINAL HYSTERECTOMY    . APPENDECTOMY    . BACK SURGERY     lumbar '09"microdiscectomy"  . CHOLECYSTECTOMY     open  . COLONOSCOPY WITH PROPOFOL N/A 11/22/2014   Procedure: COLONOSCOPY WITH PROPOFOL;  Surgeon: Garlan Fair, MD;  Location: [facility];  Service: Endoscopy;  Laterality: N/A;  . ERCP N/A 05/26/2015   Procedure: ENDOSCOPIC RETROGRADE CHOLANGIOPANCREATOGRAPHY (ERCP)   (DOING CASE IN MAIN OR);  Surgeon: Teena Irani, MD;  Location: [facility];  Service: Gastroenterology;  Laterality: N/A;  . ERCP N/A 06/06/2015   Procedure: ENDOSCOPIC RETROGRADE CHOLANGIOPANCREATOGRAPHY (ERCP);  Surgeon: Clarene Essex, MD;  Location: [facility];  Service: Endoscopy;  Laterality: N/A;  . ESOPHAGOGASTRODUODENOSCOPY (EGD) WITH PROPOFOL N/A  11/22/2014   Procedure: ESOPHAGOGASTRODUODENOSCOPY (EGD) WITH PROPOFOL;  Surgeon: Garlan Fair, MD;  Location: [facility];  Service: Endoscopy;  Laterality: N/A;  . EYE SURGERY Bilateral    cataracts  . LUMBAR FUSION  09/16/2018  . LUMBAR LAMINECTOMY/DECOMPRESSION MICRODISCECTOMY Left 12/31/2017   Procedure: Laminectomy and Foraminotomy - Lumbar Three-Four - left Extraforaminal diskectomy Lumbar Four-Five left;  Surgeon: Eustace Moore,  MD;  Location: [place];  Service: Neurosurgery;  Laterality: Left;  . PERONEAL NERVE DECOMPRESSION Left 12/31/2017   Procedure: Left PERONEAL NERVE DECOMPRESSION;  Surgeon: Eustace Moore, MD;  Location: [place];  Service: Neurosurgery;  Laterality: Left;  left  . TUBAL LIGATION      Social History:   reports that she has never smoked. She has never used smokeless tobacco. She reports that she does not drink alcohol and does not use drugs.  Allergies  Allergen Reactions  . Codeine Shortness Of Breath  . Amoxicillin Other (See Comments)  . Clavulanic Acid   . Flagyl [Metronidazole]   . Other Other (See Comments)  . Prevacid [Lansoprazole]   . Tetracyclines & Related Itching  . Trulicity [Dulaglutide]   . Azithromycin Itching and Rash  . Erythromycin Itching and Rash  . Morphine And Related Itching and Rash  . Penicillins Itching and Rash    Has patient had a PCN reaction causing immediate rash, facial/tongue/throat swelling, SOB or lightheadedness with hypotension: Yes Has patient had a PCN reaction causing severe rash involving mucus membranes or skin necrosis: No Has patient had a PCN reaction that required hospitalization: No Has patient had a PCN reaction occurring within the last 10 years: No If all of the above answers are "NO", then may proceed with Cephalosporin use.   [name] [Sulfamethoxazole-Trimethoprim] Itching and Rash    Family History  Problem Relation Age of Onset  . Cancer Maternal Aunt        breast cancer  . Cancer Maternal Grandmother        kidney cancer   . Cancer Paternal Grandmother        bone cancer   . Heart disease Mother   . Alzheimer's disease Father   . Tremor Father        possible PD  . Diabetes Mellitus II Brother   . Healthy Son   . Healthy Son      Prior to Admission medications   Medication Sig Start Date End Date Taking? Authorizing Provider  empagliflozin (JARDIANCE) 10 MG TABS tablet Take by mouth daily.   Yes [provider]  gabapentin (NEURONTIN) 300 MG capsule Take 300 mg by mouth 4 (four) times daily. 10/26/18  Yes [provider]  insulin aspart (NOVOLOG) 100 UNIT/ML injection Inject 18-34 Units into the skin 3 (three) times daily before meals. Sliding scale 12-18 per patient   Yes [provider]  acetaminophen (TYLENOL) 500 MG tablet Take 1,000 mg by mouth 2 (two) times daily as needed for moderate pain or headache.     [provider]  amLODipine (NORVASC) 10 MG tablet Take 10 mg by mouth daily. Patient not taking: Reported on 01/01/2021    [provider]  aspirin EC 81 MG tablet Take 81 mg by mouth daily.    [provider]  cholecalciferol (VITAMIN D) 1000 units tablet Take 1,000 Units by mouth daily.    [provider]  CRESTOR 40 MG tablet TAKE 1 TABLET AT BEDTIME Patient taking differently: Take 40 mg by mouth at bedtime.  04/19/13   Wall, Marijo Conception, MD  desloratadine (CLARINEX) 5 MG tablet Take 5 mg by mouth daily.    [provider]  fluticasone (FLONASE) 50 MCG/ACT nasal spray Place 2 sprays into both nostrils daily as needed for allergies.     [provider]  LORazepam (ATIVAN) 0.5 MG tablet Take 0.5 mg by mouth every 8 (eight) hours.    [provider]  losartan (COZAAR) 25 MG tablet Take 25 mg by mouth 2 (two) times daily. 11/28/20   [provider]  pantoprazole (PROTONIX) 20 MG tablet Take 20 mg by mouth daily.    [provider]  venlafaxine XR (EFFEXOR-XR) 75 MG 24 hr capsule Take 75 mg by mouth every morning.     [provider]  vitamin B-12 (CYANOCOBALAMIN) 1000 MCG tablet Take 1,000 mcg by mouth 3 (three) times a week. Take on Sundays, Wednesdays and Fridays.    [provider]    Physical Exam: Vitals:   01/12/21 0245 01/12/21 0259 01/12/21 0319 01/12/21 0430  BP: (!) 98/51  (!) 101/55 (!) 85/59  Pulse: 100  94 97  Resp: (!) 23  (!) 22 (!) 27  Temp:  100 F  (37.8 C)    TempSrc:  Oral    SpO2: 96%  95% 96%  Weight:        Constitutional: NAD, calm  Eyes: PERTLA, lids and conjunctivae normal ENMT: Mucous membranes are moist. Posterior pharynx clear of any exudate or lesions.   Neck: supple, no masses  Respiratory:  no wheezing, no crackles. No accessory muscle use.  Cardiovascular: S1 & S2 heard, regular rate and rhythm. No extremity edema.  Abdomen: Tender in RUQ, no rebound pain or guarding. Bowel sounds active.  Musculoskeletal: no clubbing / cyanosis. No joint deformity upper and lower extremities.   Skin: no significant rashes, lesions, ulcers. Warm, dry, well-perfused. Neurologic: CN 2-12 grossly intact. Sensation intact. Strength 5/5 in all 4 limbs.  Psychiatric: Awake and oriented to person, place, and situation. Pleasant and cooperative.    Labs and Imaging on Admission: I have personally reviewed following labs and imaging studies  CBC: Recent Labs  Lab 01/11/21 2336  WBC 9.5  NEUTROABS 8.7*  HGB 12.2  HCT 37.5  MCV 91.7  PLT 191*   Basic Metabolic Panel: Recent Labs  Lab 01/11/21 2336  NA 141  K 4.1  CL 113*  CO2 20*  GLUCOSE 154*  BUN 33*  CREATININE 1.70*  CALCIUM 8.3*   GFR: Estimated Creatinine Clearance: 29.2 mL/min (A) (by C-G formula based on SCr of 1.7 mg/dL (H)). Liver Function Tests: Recent Labs  Lab 01/11/21 2336  AST 330*  ALT 425*  ALKPHOS 102  BILITOT 1.3*  PROT 5.8*  ALBUMIN 3.1*   Recent Labs  Lab 01/11/21 2336  LIPASE 39   No results for input(s): AMMONIA in the last 168 hours. Coagulation Profile: Recent Labs  Lab 01/11/21 2336  INR 1.2   Cardiac Enzymes: No results for input(s): CKTOTAL, CKMB, CKMBINDEX, TROPONINI in the last 168 hours. BNP (last 3 results) No results for input(s): PROBNP in the last 8760 hours. HbA1C: No results for input(s): HGBA1C in the last 72 hours. CBG: Recent Labs  Lab 01/12/21 0302  GLUCAP 136*   Lipid Profile: No results for  input(s): CHOL, HDL, LDLCALC, TRIG, CHOLHDL, LDLDIRECT in the last 72 hours. Thyroid Function Tests: No results for input(s): TSH, T4TOTAL, FREET4, T3FREE, THYROIDAB in the last 72 hours. Anemia Panel: No results  for input(s): VITAMINB12, FOLATE, FERRITIN, TIBC, IRON, RETICCTPCT in the last 72 hours. Urine analysis:    Component Value Date/Time   COLORURINE YELLOW 01/12/2021 0441   APPEARANCEUR HAZY (A) 01/12/2021 0441   LABSPEC 1.020 01/12/2021 0441   PHURINE 5.0 01/12/2021 0441   GLUCOSEU >=500 (A) 01/12/2021 0441   HGBUR SMALL (A) 01/12/2021 0441   BILIRUBINUR NEGATIVE 01/12/2021 0441   KETONESUR NEGATIVE 01/12/2021 0441   PROTEINUR >=300 (A) 01/12/2021 0441   NITRITE NEGATIVE 01/12/2021 0441   LEUKOCYTESUR NEGATIVE 01/12/2021 0441   Sepsis Labs: @LABRCNTIP (procalcitonin:4,lacticidven:4) ) Recent Results (from the past 240 hour(s))  Resp Panel by RT-PCR (Flu A&B, Covid) Nasopharyngeal Swab     Status: None   Collection Time: 01/11/21 11:36 PM   Specimen: Nasopharyngeal Swab; Nasopharyngeal(NP) swabs in vial transport medium  Result Value Ref Range Status   SARS Coronavirus 2 by RT PCR NEGATIVE NEGATIVE Final    Comment: (NOTE) SARS-CoV-2 target nucleic acids are NOT DETECTED.  The SARS-CoV-2 RNA is generally detectable in upper respiratory specimens during the acute phase of infection. The lowest concentration of SARS-CoV-2 viral copies this assay can detect is 138 copies/mL. A negative result does not preclude SARS-Cov-2 infection and should not be used as the sole basis for treatment or other patient management decisions. A negative result may occur with  improper specimen collection/handling, submission of specimen other than nasopharyngeal swab, presence of viral mutation(s) within the areas targeted by this assay, and inadequate number of viral copies(<138 copies/mL). A negative result must be combined with clinical observations, patient history, and  epidemiological information. The expected result is Negative.  Fact Sheet for Patients:  EntrepreneurPulse.com.au  Fact Sheet for Healthcare Providers:  IncredibleEmployment.be  This test is no t yet approved or cleared by the [place] FDA and  has been authorized for detection and/or diagnosis of SARS-CoV-2 by FDA under an Emergency Use Authorization (EUA). This EUA will remain  in effect (meaning this test can be used) for the duration of the COVID-19 declaration under Section 564(b)(1) of the Act, 21 U.S.C.section 360bbb-3(b)(1), unless the authorization is terminated  or revoked sooner.       Influenza A by PCR NEGATIVE NEGATIVE Final   Influenza B by PCR NEGATIVE NEGATIVE Final    Comment: (NOTE) The Xpert Xpress SARS-CoV-2/FLU/RSV plus assay is intended as an aid in the diagnosis of influenza from Nasopharyngeal swab specimens and should not be used as a sole basis for treatment. Nasal washings and aspirates are unacceptable for Xpert Xpress SARS-CoV-2/FLU/RSV testing.  Fact Sheet for Patients: EntrepreneurPulse.com.au  Fact Sheet for Healthcare Providers: IncredibleEmployment.be  This test is not yet approved or cleared by the [place] FDA and has been authorized for detection and/or diagnosis of SARS-CoV-2 by FDA under an Emergency Use Authorization (EUA). This EUA will remain in effect (meaning this test can be used) for the duration of the COVID-19 declaration under Section 564(b)(1) of the Act, 21 U.S.C. section 360bbb-3(b)(1), unless the authorization is terminated or revoked.  Performed at [facility], [place] [address], [place], [place] [address]      Radiological Exams on Admission: CT ABDOMEN PELVIS WO CONTRAST  Result Date: 01/12/2021 CLINICAL DATA:  Dyspnea, fever, sepsis, glomerulonephritis EXAM: CT CHEST, ABDOMEN AND PELVIS WITHOUT CONTRAST TECHNIQUE:  Multidetector CT imaging of the chest, abdomen and pelvis was performed following the standard protocol without IV contrast. COMPARISON:  05/26/2020 FINDINGS: CT CHEST FINDINGS Cardiovascular: Mild coronary artery calcification. Global cardiac size within normal limits. No  pericardial effusion. The central pulmonary arteries are of normal caliber. Mild atherosclerotic calcification within the thoracic aorta. No aortic aneurysm. Mediastinum/Nodes: The visualized thyroid is unremarkable. No pathologic thoracic adenopathy. Moderate hiatal hernia. The esophagus demonstrates an air-fluid level which may relate to gastroesophageal reflux or esophageal dysmotility. Lungs/Pleura: Mild bibasilar atelectasis. Trace interstitial pulmonary edema with smooth interlobular septal thickening at the lung bases and apices. No confluent pulmonary infiltrate. No pneumothorax or pleural effusion. The central airways are widely patent. Musculoskeletal: No acute bone abnormality. No suspicious lytic or blastic bone lesion. CT ABDOMEN PELVIS FINDINGS Hepatobiliary: Status post cholecystectomy. Mild pneumobilia suggests prior sphincterotomy. There is, however, hyperdensity noted within the central biliary tree, better appreciated on coronal imaging representing a intraluminal calculus within the central left hepatic bile ducts measuring 10 mm x 18 mm. There is mild left intrahepatic biliary ductal dilation and fluid opacification of these ducts in keeping with an obstructed biliary tree. There is persistent pneumobilia within the a right hepatic ducts indicating patency of this portion of the biliary tree. The liver is otherwise unremarkable. Pancreas: Unremarkable Spleen: Unremarkable Adrenals/Urinary Tract: The adrenal glands are unremarkable. The kidneys are normal in size and position. No intrarenal or ureteral calculi. No hydronephrosis. The bladder is mildly distended, but is otherwise unremarkable. Stomach/Bowel: Extensive descending  and sigmoid colonic diverticulosis. No superimposed inflammatory change. The stomach, small bowel, and large bowel are otherwise unremarkable. Appendix absent. No free intraperitoneal fluid or gas. Vascular/Lymphatic: Mild atherosclerotic calcification within the thoracic aorta. No aortic aneurysm. No pathologic adenopathy within the abdomen and pelvis. Reproductive: Status post hysterectomy. No adnexal masses. Other: Tiny fat containing umbilical hernia.  Rectum unremarkable. Musculoskeletal: No lytic or blastic bone lesion. L4-5 fusion with instrumentation has been performed. No acute bone abnormality. IMPRESSION: Intraluminal calculi within the central left biliary tree with distal fluid opacification and dilation of the intrahepatic bile ducts within the left hepatic lobe. This may manifest clinically as sepsis secondary to ascending cholangitis. Mild coronary artery calcification. Trace interstitial pulmonary edema. Extensive distal colonic diverticulosis Aortic Atherosclerosis (ICD10-I70.0). Electronically Signed   By: Fidela Salisbury MD   On: 01/12/2021 02:44   CT CHEST WO CONTRAST  Result Date: 01/12/2021 CLINICAL DATA:  Dyspnea, fever, sepsis, glomerulonephritis EXAM: CT CHEST, ABDOMEN AND PELVIS WITHOUT CONTRAST TECHNIQUE: Multidetector CT imaging of the chest, abdomen and pelvis was performed following the standard protocol without IV contrast. COMPARISON:  05/26/2020 FINDINGS: CT CHEST FINDINGS Cardiovascular: Mild coronary artery calcification. Global cardiac size within normal limits. No pericardial effusion. The central pulmonary arteries are of normal caliber. Mild atherosclerotic calcification within the thoracic aorta. No aortic aneurysm. Mediastinum/Nodes: The visualized thyroid is unremarkable. No pathologic thoracic adenopathy. Moderate hiatal hernia. The esophagus demonstrates an air-fluid level which may relate to gastroesophageal reflux or esophageal dysmotility. Lungs/Pleura: Mild  bibasilar atelectasis. Trace interstitial pulmonary edema with smooth interlobular septal thickening at the lung bases and apices. No confluent pulmonary infiltrate. No pneumothorax or pleural effusion. The central airways are widely patent. Musculoskeletal: No acute bone abnormality. No suspicious lytic or blastic bone lesion. CT ABDOMEN PELVIS FINDINGS Hepatobiliary: Status post cholecystectomy. Mild pneumobilia suggests prior sphincterotomy. There is, however, hyperdensity noted within the central biliary tree, better appreciated on coronal imaging representing a intraluminal calculus within the central left hepatic bile ducts measuring 10 mm x 18 mm. There is mild left intrahepatic biliary ductal dilation and fluid opacification of these ducts in keeping with an obstructed biliary tree. There is persistent pneumobilia within the a right hepatic ducts  indicating patency of this portion of the biliary tree. The liver is otherwise unremarkable. Pancreas: Unremarkable Spleen: Unremarkable Adrenals/Urinary Tract: The adrenal glands are unremarkable. The kidneys are normal in size and position. No intrarenal or ureteral calculi. No hydronephrosis. The bladder is mildly distended, but is otherwise unremarkable. Stomach/Bowel: Extensive descending and sigmoid colonic diverticulosis. No superimposed inflammatory change. The stomach, small bowel, and large bowel are otherwise unremarkable. Appendix absent. No free intraperitoneal fluid or gas. Vascular/Lymphatic: Mild atherosclerotic calcification within the thoracic aorta. No aortic aneurysm. No pathologic adenopathy within the abdomen and pelvis. Reproductive: Status post hysterectomy. No adnexal masses. Other: Tiny fat containing umbilical hernia.  Rectum unremarkable. Musculoskeletal: No lytic or blastic bone lesion. L4-5 fusion with instrumentation has been performed. No acute bone abnormality. IMPRESSION: Intraluminal calculi within the central left biliary tree  with distal fluid opacification and dilation of the intrahepatic bile ducts within the left hepatic lobe. This may manifest clinically as sepsis secondary to ascending cholangitis. Mild coronary artery calcification. Trace interstitial pulmonary edema. Extensive distal colonic diverticulosis Aortic Atherosclerosis (ICD10-I70.0). Electronically Signed   By: Fidela Salisbury MD   On: 01/12/2021 02:44   DG Chest Port 1 View  Result Date: 01/12/2021 CLINICAL DATA:  Vomiting and fever EXAM: PORTABLE CHEST 1 VIEW COMPARISON:  12/25/2017 FINDINGS: The heart size and mediastinal contours are within normal limits. Both lungs are clear. The visualized skeletal structures are unremarkable. IMPRESSION: No active disease. Electronically Signed   By: Ulyses Jarred M.D.   On: 01/12/2021 00:17    EKG: Independently reviewed. Sinus rhythm, LAFB.   Assessment/Plan  1. Acute cholangitis; septic shock    - Presents with RUQ pain, fevers, confusion, and found on CT to have calculi in central biliary tree on left with intrahepatic biliary dilation on left and elevated LFTs in hepatocellular pattern  - She was given 1 liter IVF with EMS  - Blood cultures collected in ED, 2 more liters IVF given, and meropenem started  - Confusion resolved and BP was improving but now trending back down; she has received 3 liters of IVF, has new supplemental O2 requirement, and developing interstitial edema noted on CT  - Appreciate Dr. Paulita Fujita of GI, planning to see her this am  - Continue meropenem, continue albumin, start norepinephrine     2. Renally insufficiency  - SCr is 1.70 on admission, up from 1.05 in January 2020  - Progression in CKD vs AKI  - Hold ARB, renally-dose medications, monitor    3. Acute encephalopathy  - Resolved  - Presented with confusion which was likely secondary to acute infection and has resolved    4. Thrombocytopenia  - Platelets 110k on admission  - Likely secondary to infection  - Treat  infection and monitor    5. Hypertension  - Now hypotensive  - Hold antihypertensives    6. Type II DM  - No recent A1c in EMR  - Check CBGs and use a low-intensity SSI   7. Acute hypoxic respiratory failure  - Patient denies SOB, chest pain, or cough but now requiring 4 Lpm supplemental O2 and has developing edema on chest CT  - EF was normal on remote echo  - Cautious with IVF, update echo, continue supplemental O2 as needed    DVT prophylaxis: sq heparin  Code Status: Full  Level of Care: Level of care: Stepdown Family Communication: None present  Disposition Plan:  Patient is from: Home  Anticipated d/c is to: TBD Anticipated d/c date is:  01/16/21 Patient currently: Requires ongoing IV antibiotics and monitoring in SDU/ICU, possible GI or IR intervention  Consults called: GI, PCCM     Admission status: Inpatient    Vianne Bulls, MD [facility]  01/12/2021, 6:14 AM

## 2021-01-12 NOTE — Consult Note (Signed)
NAME:  Tina Briggs, MRN:  [id number], DOB:  10/11/1945, LOS: 0 ADMISSION DATE:  01/11/2021, CONSULTATION DATE: 01/12/2021 REFERRING MD: Dr. Myna Hidalgo, TRH, CHIEF COMPLAINT: Septic shock  History of Present Illness:  75 year old woman with history of metabolic syndrome, CKD stage IIIa, history of gallstones with remote cholecystectomy, choledocholithiasis 2016 requiring ERCP.  Admitted 4/15 with RUQ pain, fever, confusion, N/V and poor p.o. intake.  Symptoms escalating over the last 3 days.  Noted to have evolving shock, hypoxemia requiring 4 L/min on presentation.  CT abdomen/pelvis showed central intraluminal stone with with left intrahepatic ductal dilatation consistent with obstruction.  Labs significant for acute on chronic renal failure, transaminitis, lactic acid 2.6.  Volume resuscitation 2 L, meropenem initiated.  Continue to have hypotension although mentation improved.  Low-dose norepinephrine started via PIV.  Pertinent  Medical History   Past Medical History:  Diagnosis Date  . Anemia   . Aspiration pneumonia ([place])    after common bile duct obstruction  . CATARACT, RIGHT EYE    had repaired  . Depression   . Diverticulitis    recurrent episodes  . DM   . GERD   . 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 Mild to mod TI scarring. (Dr. Lorrene Reid)  . HNP (herniated nucleus pulposus), lumbar    Recurrent  . HYPERLIPIDEMIA-MIXED   . HYPERTENSION, UNSPECIFIED   . OBESITY   . PONV (postoperative nausea and vomiting)    past history only, "sometimes they put a patch on me"     Significant Hospital Events: Including procedures, antibiotic start and stop dates in addition to other pertinent events   . Admitted with septic shock, presumed choledocholithiasis . CT abdomen/pelvis 4/15 >> intraluminal calculi in central left biliary tree, distal fluid opacification and dilation left intrahepatic bile ducts suggestive of ascending  cholangitis.  Trace interstitial pulmonary edema, extensive distal colonic diverticulosis. . CT chest 4/15 >> mild bibasilar atelectasis, trace interstitial pulmonary edema with smooth interlobular septal thickening at the bases and apices. . Meropenem started 4/15  Interim History / Subjective:   Admitted to ICU/stepdown, currently norepinephrine 2 Lactate 2.6 >> 1.0 Last fever 101.6 at 2300 Remains on 4 L/min nasal cannula > SpO2 100% Positive 475 cc charted  Objective   Blood pressure (!) 84/41, pulse 80, temperature 100 F (37.8 C), temperature source Oral, resp. rate 17, height 5\' 3"  (1.6 m), weight 85.1 kg, SpO2 97 %.        Intake/Output Summary (Last 24 hours) at 01/12/2021 1517 Last data filed at 01/12/2021 0440 Gross per 24 hour  Intake 1100 ml  Output 625 ml  Net 475 ml   Filed Weights   01/12/21 0012 01/12/21 0600  Weight: 80.7 kg 85.1 kg    Examination: General: Overweight elderly woman, laying in bed in no distress on nasal cannula O2 HENT: Oropharynx clear, no thrush, moist.  No stridor Lungs: Clear bilaterally without crackles or wheezes Cardiovascular: Regular, no murmur Abdomen: Mild tenderness in both upper quadrants, less so in the lower quadrants.  Positive bowel sounds Extremities: No edema Neuro: Awake, alert, appropriate, moves all extremities, follows commands GU: Deferred  Labs/imaging that I have personally reviewed  (right click and "Reselect all SmartList Selections" daily)  All labs, imaging to date have been reviewed  Resolved Hospital Problem list   Acute toxic metabolic encephalopathy, septic encephalopathy  Assessment & Plan:   Septic shock due to ascending cholangitis, apparent choledocholithiasis based on CT. Plan: -Agree with  meropenem (note PCN allergy, low risk crossover) -Urgent GI evaluation and probable repeat ERCP depending on their evaluation and recommendations -Blood cultures drawn and pending -She is received 3 L of IV  fluids per notes, albumin early this morning.  I think it is safe and appropriate for [place] to continue to push IV fluid resuscitation.  I will order an LR bolus now and follow for any evidence of respiratory compromise -Continue norepinephrine via peripheral IV for now, hopefully will be able to wean with antibiotics, volume resuscitation, definitive treatment of her biliary obstruction.  If she has progressive pressor needs then we will obtain central IV access -Check random cortisol, consider stress dose steroids if less than 20 -Procalcitonin now and follow -Lactic acid has cleared, recheck if recurrent periods of hypotension -Chronic antihypertensives have been held  Acute hypoxemia. Plan: -Currently on 4 L/min but looks like this can be weaned -CT chest more consistent with atelectasis than pulmonary edema.  Push pulmonary hygiene, especially post procedure  Transaminitis/hyperbilirubinemia.  Due to the above Plan: -Follow LFT, coags  Acute on chronic stage IIIa renal failure.  Due to septic shock and hypoperfusion Plan: -Work to restore adequate renal perfusion -Follow BMP, urine output closely -Avoid any nephrotoxins  Thrombocytopenia, likely due to sepsis.  No evidence of active bleeding. Plan: -Follow CBC.  May become relevant if she is to have ERCP/procedure and if continuing to drop -Consider DIC panel if continues to drop  History hypertension Plan: -Home losartan on hold  Diabetes mellitus with hyperglycemia, on home insulin Plan: -Sliding scale insulin has been ordered -Resume Jardiance when she is reliably taking p.o. nutrition  Depression Plan: -Resume Effexor as soon as feasible, taking p.o.  GERD, on Pepcid as needed at home Plan: -PPI ordered  Left lower extremity neuropathy Plan: -Restart her gabapentin when taking p.o.   Best practice (right click and "Reselect all SmartList Selections" daily)  Diet:  NPO Pain/Anxiety/Delirium protocol (if  indicated): No VAP protocol (if indicated): Not indicated DVT prophylaxis: Subcutaneous Heparin GI prophylaxis: PPI Glucose control:  SSI Yes Central venous access:  N/A Arterial line:  N/A Foley:  N/A Mobility:  bed rest  PT consulted: N/A Last date of multidisciplinary goals of care discussion [Pending] Code Status:  full code Disposition: ICU  Labs   CBC: Recent Labs  Lab 01/11/21 2336  WBC 9.5  NEUTROABS 8.7*  HGB 12.2  HCT 37.5  MCV 91.7  PLT 110*    Basic Metabolic Panel: Recent Labs  Lab 01/11/21 2336  NA 141  K 4.1  CL 113*  CO2 20*  GLUCOSE 154*  BUN 33*  CREATININE 1.70*  CALCIUM 8.3*   GFR: Estimated Creatinine Clearance: 30 mL/min (A) (by C-G formula based on SCr of 1.7 mg/dL (H)). Recent Labs  Lab 01/11/21 2336 01/12/21 0136  WBC 9.5  --   LATICACIDVEN 2.6* 1.0    Liver Function Tests: Recent Labs  Lab 01/11/21 2336  AST 330*  ALT 425*  ALKPHOS 102  BILITOT 1.3*  PROT 5.8*  ALBUMIN 3.1*   Recent Labs  Lab 01/11/21 2336  LIPASE 39   No results for input(s): AMMONIA in the last 168 hours.  ABG No results found for: PHART, PCO2ART, PO2ART, HCO3, TCO2, ACIDBASEDEF, O2SAT   Coagulation Profile: Recent Labs  Lab 01/11/21 2336  INR 1.2    Cardiac Enzymes: No results for input(s): CKTOTAL, CKMB, CKMBINDEX, TROPONINI in the last 168 hours.  HbA1C: Hgb A1c MFr Bld  Date/Time Value Ref  Range Status  09/09/2018 01:02 PM 6.8 (H) 4.8 - 5.6 % Final    Comment:    (NOTE) Pre diabetes:          5.7%-6.4% Diabetes:              >6.4% Glycemic control for   <7.0% adults with diabetes   12/25/2017 02:43 PM 7.6 (H) 4.8 - 5.6 % Final    Comment:    (NOTE) Pre diabetes:          5.7%-6.4% Diabetes:              >6.4% Glycemic control for   <7.0% adults with diabetes     CBG: Recent Labs  Lab 01/12/21 0302 01/12/21 0829  GLUCAP 136* 137*    Review of Systems:   Still having some bilateral upper quadrant pain Acute on  chronic diarrhea Denies any dyspnea or other discomfort Chronic left lower extremity neuropathic pain  Past Medical History:  She,  has a past medical history of Anemia, Aspiration pneumonia ([place]), CATARACT, RIGHT EYE, Depression, Diverticulitis, DM, GERD, Glomerulonephritis (11/18/2017), HNP (herniated nucleus pulposus), lumbar, HYPERLIPIDEMIA-MIXED, HYPERTENSION, UNSPECIFIED, OBESITY, and PONV (postoperative nausea and vomiting).   Surgical History:   Past Surgical History:  Procedure Laterality Date  . ABDOMINAL HYSTERECTOMY    . APPENDECTOMY    . BACK SURGERY     lumbar '09"microdiscectomy"  . CHOLECYSTECTOMY     open  . COLONOSCOPY WITH PROPOFOL N/A 11/22/2014   Procedure: COLONOSCOPY WITH PROPOFOL;  Surgeon: Garlan Fair, MD;  Location: [facility];  Service: Endoscopy;  Laterality: N/A;  . ERCP N/A 05/26/2015   Procedure: ENDOSCOPIC RETROGRADE CHOLANGIOPANCREATOGRAPHY (ERCP)   (DOING CASE IN MAIN OR);  Surgeon: Teena Irani, MD;  Location: [facility];  Service: Gastroenterology;  Laterality: N/A;  . ERCP N/A 06/06/2015   Procedure: ENDOSCOPIC RETROGRADE CHOLANGIOPANCREATOGRAPHY (ERCP);  Surgeon: Clarene Essex, MD;  Location: [facility];  Service: Endoscopy;  Laterality: N/A;  . ESOPHAGOGASTRODUODENOSCOPY (EGD) WITH PROPOFOL N/A 11/22/2014   Procedure: ESOPHAGOGASTRODUODENOSCOPY (EGD) WITH PROPOFOL;  Surgeon: Garlan Fair, MD;  Location: [facility];  Service: Endoscopy;  Laterality: N/A;  . EYE SURGERY Bilateral    cataracts  . LUMBAR FUSION  09/16/2018  . LUMBAR LAMINECTOMY/DECOMPRESSION MICRODISCECTOMY Left 12/31/2017   Procedure: Laminectomy and Foraminotomy - Lumbar Three-Four - left Extraforaminal diskectomy Lumbar Four-Five left;  Surgeon: Eustace Moore, MD;  Location: [facility];  Service: Neurosurgery;  Laterality: Left;  . PERONEAL NERVE DECOMPRESSION Left 12/31/2017   Procedure: Left PERONEAL NERVE DECOMPRESSION;  Surgeon: Eustace Moore, MD;  Location: [facility];  Service:  Neurosurgery;  Laterality: Left;  left  . TUBAL LIGATION       Social History:   reports that she has never smoked. She has never used smokeless tobacco. She reports that she does not drink alcohol and does not use drugs.   Family History:  Her family history includes Alzheimer's disease in her father; Cancer in her maternal aunt, maternal grandmother, and paternal grandmother; Diabetes Mellitus II in her brother; Healthy in her son and son; Heart disease in her mother; Tremor in her father.   Allergies Allergies  Allergen Reactions  . Codeine Shortness Of Breath  . Amoxicillin Other (See Comments)  . Clavulanic Acid   . Flagyl [Metronidazole]   . Other Other (See Comments)  . Prevacid [Lansoprazole]   . Tetracyclines & Related Itching  . Trulicity [Dulaglutide]   . Azithromycin Itching and Rash  . Erythromycin Itching and Rash  .  Morphine And Related Itching and Rash  . Penicillins Itching and Rash    Has patient had a PCN reaction causing immediate rash, facial/tongue/throat swelling, SOB or lightheadedness with hypotension: Yes Has patient had a PCN reaction causing severe rash involving mucus membranes or skin necrosis: No Has patient had a PCN reaction that required hospitalization: No Has patient had a PCN reaction occurring within the last 10 years: No If all of the above answers are "NO", then may proceed with Cephalosporin use.   [name] [Sulfamethoxazole-Trimethoprim] Itching and Rash     Home Medications  Prior to Admission medications   Medication Sig Start Date End Date Taking? Authorizing Provider  acetaminophen (TYLENOL) 500 MG tablet Take 1,000 mg by mouth 2 (two) times daily as needed for moderate pain or headache.    Yes [provider]  aspirin EC 81 MG tablet Take 81 mg by mouth daily.   Yes [provider]  cholecalciferol (VITAMIN D) 1000 units tablet Take 3,000 Units by mouth daily.   Yes [provider]  cholestyramine light  (PREVALITE) 4 g packet Take 4 g by mouth every other day. 10/12/20  Yes [provider]  CRESTOR 40 MG tablet TAKE 1 TABLET AT BEDTIME Patient taking differently: Take 40 mg by mouth at bedtime. 04/19/13  Yes Wall, Marijo Conception, MD  famotidine (PEPCID) 40 MG tablet Take 40 mg by mouth 2 (two) times daily as needed for heartburn or indigestion. 10/31/20  Yes [provider]  fluticasone (FLONASE) 50 MCG/ACT nasal spray Place 2 sprays into both nostrils daily as needed for allergies.    Yes [provider]  gabapentin (NEURONTIN) 300 MG capsule Take 300-600 mg by mouth See admin instructions. 300mg  in the morning 600mg  at night 10/26/18  Yes [provider]  ibuprofen (ADVIL) 200 MG tablet Take 400 mg by mouth every 6 (six) hours as needed for fever, headache or mild pain.   Yes [provider]  JARDIANCE 25 MG TABS tablet Take 25 mg by mouth daily. 01/04/21  Yes [provider]  losartan (COZAAR) 25 MG tablet Take 25 mg by mouth 2 (two) times daily. 11/28/20  Yes [provider]  Multiple Vitamins-Minerals (PRESERVISION AREDS 2 PO) Take 1 tablet by mouth 2 (two) times daily.   Yes [provider]  NOVOLOG FLEXPEN RELION 100 UNIT/ML FlexPen Inject 14-16 Units into the skin 3 (three) times daily as needed. BS 12/05/20  Yes [provider]  venlafaxine XR (EFFEXOR-XR) 75 MG 24 hr capsule Take 75 mg by mouth every morning.    Yes [provider]  vitamin B-12 (CYANOCOBALAMIN) 1000 MCG tablet Take 1,000 mcg by mouth 3 (three) times a week. Take on Sundays, Wednesdays and Fridays.   Yes [provider]     Critical care time: 78 min     Baltazar Apo, MD, PhD 01/12/2021, 9:21 AM [facility] [phone] or if no answer before 7:00PM call [phone] For any issues after 7:00PM please call eLink [phone]

## 2021-01-12 NOTE — Progress Notes (Signed)
  Echocardiogram 2D Echocardiogram has been performed.  Tina Briggs 01/12/2021, 4:23 PM

## 2021-01-12 NOTE — Anesthesia Procedure Notes (Signed)
Procedure Name: Intubation Date/Time: 01/12/2021 9:41 AM Performed by: Niel Hummer, CRNA Pre-anesthesia Checklist: Patient identified, Emergency Drugs available, Suction available and Patient being monitored Patient Re-evaluated:Patient Re-evaluated prior to induction Oxygen Delivery Method: Circle system utilized Preoxygenation: Pre-oxygenation with 100% oxygen Induction Type: IV induction Ventilation: Mask ventilation without difficulty Laryngoscope Size: Mac and 4 Grade View: Grade I Tube type: Oral Tube size: 7.0 mm Number of attempts: 1 Airway Equipment and Method: Stylet Placement Confirmation: ETT inserted through vocal cords under direct vision,  positive ETCO2 and breath sounds checked- equal and bilateral Secured at: 22 cm Tube secured with: Tape Dental Injury: Teeth and Oropharynx as per pre-operative assessment

## 2021-01-12 NOTE — Consult Note (Signed)
Reason for Consult: Cholangitis probable CBD stone Referring Physician: ICU team  Tina Briggs is an 75 y.o. female.  HPI: Patient seen and examined in her hospital computer chart reviewed and she is familiar to me from a previous ERCP in 2016 for residual CBD stones and she has been asymptomatic until a week ago when she had abdominal pain radiating to her back which was short-lived but it returned on Wednesday and it did not go away and as the pain increased with nausea vomiting and she developed a fever she presented to the emergency room and her work-up was reviewed and other than back surgery she has not had any other surgeries since I last saw her  Past Medical History:  Diagnosis Date  . Anemia   . Aspiration pneumonia ([place])    after common bile duct obstruction  . CATARACT, RIGHT EYE    had repaired  . Depression   . Diverticulitis    recurrent episodes  . DM   . GERD   . 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 Mild to mod TI scarring. (Dr. Lorrene Reid)  . HNP (herniated nucleus pulposus), lumbar    Recurrent  . HYPERLIPIDEMIA-MIXED   . HYPERTENSION, UNSPECIFIED   . OBESITY   . PONV (postoperative nausea and vomiting)    past history only, "sometimes they put a patch on me"    Past Surgical History:  Procedure Laterality Date  . ABDOMINAL HYSTERECTOMY    . APPENDECTOMY    . BACK SURGERY     lumbar '09"microdiscectomy"  . CHOLECYSTECTOMY     open  . COLONOSCOPY WITH PROPOFOL N/A 11/22/2014   Procedure: COLONOSCOPY WITH PROPOFOL;  Surgeon: Garlan Fair, MD;  Location: [facility];  Service: Endoscopy;  Laterality: N/A;  . ERCP N/A 05/26/2015   Procedure: ENDOSCOPIC RETROGRADE CHOLANGIOPANCREATOGRAPHY (ERCP)   (DOING CASE IN MAIN OR);  Surgeon: Teena Irani, MD;  Location: [facility];  Service: Gastroenterology;  Laterality: N/A;  . ERCP N/A 06/06/2015   Procedure: ENDOSCOPIC RETROGRADE CHOLANGIOPANCREATOGRAPHY  (ERCP);  Surgeon: Clarene Essex, MD;  Location: [facility];  Service: Endoscopy;  Laterality: N/A;  . ESOPHAGOGASTRODUODENOSCOPY (EGD) WITH PROPOFOL N/A 11/22/2014   Procedure: ESOPHAGOGASTRODUODENOSCOPY (EGD) WITH PROPOFOL;  Surgeon: Garlan Fair, MD;  Location: [facility];  Service: Endoscopy;  Laterality: N/A;  . EYE SURGERY Bilateral    cataracts  . LUMBAR FUSION  09/16/2018  . LUMBAR LAMINECTOMY/DECOMPRESSION MICRODISCECTOMY Left 12/31/2017   Procedure: Laminectomy and Foraminotomy - Lumbar Three-Four - left Extraforaminal diskectomy Lumbar Four-Five left;  Surgeon: Eustace Moore, MD;  Location: [place];  Service: Neurosurgery;  Laterality: Left;  . PERONEAL NERVE DECOMPRESSION Left 12/31/2017   Procedure: Left PERONEAL NERVE DECOMPRESSION;  Surgeon: Eustace Moore, MD;  Location: [place];  Service: Neurosurgery;  Laterality: Left;  left  . TUBAL LIGATION      Family History  Problem Relation Age of Onset  . Cancer Maternal Aunt        breast cancer  . Cancer Maternal Grandmother        kidney cancer   . Cancer Paternal Grandmother        bone cancer   . Heart disease Mother   . Alzheimer's disease Father   . Tremor Father        possible PD  . Diabetes Mellitus II Brother   . Healthy Son   . Healthy Son     Social History:  reports that she has never  smoked. She has never used smokeless tobacco. She reports that she does not drink alcohol and does not use drugs.  Allergies:  Allergies  Allergen Reactions  . Codeine Shortness Of Breath  . Amoxicillin Other (See Comments)  . Clavulanic Acid   . Flagyl [Metronidazole]   . Other Other (See Comments)  . Prevacid [Lansoprazole]   . Tetracyclines & Related Itching  . Trulicity [Dulaglutide]   . Azithromycin Itching and Rash  . Erythromycin Itching and Rash  . Morphine And Related Itching and Rash  . Penicillins Itching and Rash    Has patient had a PCN reaction causing immediate rash, facial/tongue/throat swelling, SOB or  lightheadedness with hypotension: Yes Has patient had a PCN reaction causing severe rash involving mucus membranes or skin necrosis: No Has patient had a PCN reaction that required hospitalization: No Has patient had a PCN reaction occurring within the last 10 years: No If all of the above answers are "NO", then may proceed with Cephalosporin use.   [name] [Sulfamethoxazole-Trimethoprim] Itching and Rash    Medications: I have reviewed the patient's current medications.  Results for orders placed or performed during the hospital encounter of 01/11/21 (from the past 48 hour(s))  Resp Panel by RT-PCR (Flu A&B, Covid) Nasopharyngeal Swab     Status: None   Collection Time: 01/11/21 11:36 PM   Specimen: Nasopharyngeal Swab; Nasopharyngeal(NP) swabs in vial transport medium  Result Value Ref Range   SARS Coronavirus 2 by RT PCR NEGATIVE NEGATIVE    Comment: (NOTE) SARS-CoV-2 target nucleic acids are NOT DETECTED.  The SARS-CoV-2 RNA is generally detectable in upper respiratory specimens during the acute phase of infection. The lowest concentration of SARS-CoV-2 viral copies this assay can detect is 138 copies/mL. A negative result does not preclude SARS-Cov-2 infection and should not be used as the sole basis for treatment or other patient management decisions. A negative result may occur with  improper specimen collection/handling, submission of specimen other than nasopharyngeal swab, presence of viral mutation(s) within the areas targeted by this assay, and inadequate number of viral copies(<138 copies/mL). A negative result must be combined with clinical observations, patient history, and epidemiological information. The expected result is Negative.  Fact Sheet for Patients:  EntrepreneurPulse.com.au  Fact Sheet for Healthcare Providers:  IncredibleEmployment.be  This test is no t yet approved or cleared by the [place] FDA and  has  been authorized for detection and/or diagnosis of SARS-CoV-2 by FDA under an Emergency Use Authorization (EUA). This EUA will remain  in effect (meaning this test can be used) for the duration of the COVID-19 declaration under Section 564(b)(1) of the Act, 21 U.S.C.section 360bbb-3(b)(1), unless the authorization is terminated  or revoked sooner.       Influenza A by PCR NEGATIVE NEGATIVE   Influenza B by PCR NEGATIVE NEGATIVE    Comment: (NOTE) The Xpert Xpress SARS-CoV-2/FLU/RSV plus assay is intended as an aid in the diagnosis of influenza from Nasopharyngeal swab specimens and should not be used as a sole basis for treatment. Nasal washings and aspirates are unacceptable for Xpert Xpress SARS-CoV-2/FLU/RSV testing.  Fact Sheet for Patients: EntrepreneurPulse.com.au  Fact Sheet for Healthcare Providers: IncredibleEmployment.be  This test is not yet approved or cleared by the [place] FDA and has been authorized for detection and/or diagnosis of SARS-CoV-2 by FDA under an Emergency Use Authorization (EUA). This EUA will remain in effect (meaning this test can be used) for the duration of the COVID-19 declaration under Section 564(b)(1)  of the Act, 21 U.S.C. section 360bbb-3(b)(1), unless the authorization is terminated or revoked.  Performed at [facility], [place] [address], [place], [place] [address]   Lactic acid, plasma     Status: Abnormal   Collection Time: 01/11/21 11:36 PM  Result Value Ref Range   Lactic Acid, Venous 2.6 (HH) 0.5 - 1.9 mmol/L    Comment: CRITICAL RESULT CALLED TO, READ BACK BY AND VERIFIED WITH: KERCHER,Q 01/12/21 @0145  BY SEEL,M Performed at [facility], [place] [address] [place], [place] [address]   Comprehensive metabolic panel     Status: Abnormal   Collection Time: 01/11/21 11:36 PM  Result Value Ref Range   Sodium 141 135 - 145 mmol/L   Potassium 4.1 3.5 - 5.1  mmol/L   Chloride 113 (H) 98 - 111 mmol/L   CO2 20 (L) 22 - 32 mmol/L   Glucose, Bld 154 (H) 70 - 99 mg/dL    Comment: Glucose reference range applies only to samples taken after fasting for at least 8 hours.   BUN 33 (H) 8 - 23 mg/dL   Creatinine, Ser 1.70 (H) 0.44 - 1.00 mg/dL   Calcium 8.3 (L) 8.9 - 10.3 mg/dL   Total Protein 5.8 (L) 6.5 - 8.1 g/dL   Albumin 3.1 (L) 3.5 - 5.0 g/dL   AST 330 (H) 15 - 41 U/L    Comment: RESULTS CONFIRMED BY MANUAL DILUTION   ALT 425 (H) 0 - 44 U/L   Alkaline Phosphatase 102 38 - 126 U/L   Total Bilirubin 1.3 (H) 0.3 - 1.2 mg/dL   GFR, Estimated 31 (L) >60 mL/min    Comment: (NOTE) Calculated using the CKD-EPI Creatinine Equation (2021)    Anion gap 8 5 - 15    Comment: Performed at [facility], [place] [address], [place], [place] [address]  CBC WITH DIFFERENTIAL     Status: Abnormal   Collection Time: 01/11/21 11:36 PM  Result Value Ref Range   WBC 9.5 4.0 - 10.5 K/uL   RBC 4.09 3.87 - 5.11 MIL/uL   Hemoglobin 12.2 12.0 - 15.0 g/dL   HCT 37.5 36.0 - 46.0 %   MCV 91.7 80.0 - 100.0 fL   MCH 29.8 26.0 - 34.0 pg   MCHC 32.5 30.0 - 36.0 g/dL   RDW 13.5 11.5 - 15.5 %   Platelets 110 (L) 150 - 400 K/uL    Comment: SPECIMEN CHECKED FOR CLOTS Immature Platelet Fraction may be clinically indicated, consider ordering this additional test [id number] REPEATED TO VERIFY PLATELET COUNT CONFIRMED BY SMEAR    nRBC 0.0 0.0 - 0.2 %   Neutrophils Relative % 91 %   Neutro Abs 8.7 (H) 1.7 - 7.7 K/uL   Lymphocytes Relative 3 %   Lymphs Abs 0.3 (L) 0.7 - 4.0 K/uL   Monocytes Relative 5 %   Monocytes Absolute 0.4 0.1 - 1.0 K/uL   Eosinophils Relative 0 %   Eosinophils Absolute 0.0 0.0 - 0.5 K/uL   Basophils Relative 0 %   Basophils Absolute 0.0 0.0 - 0.1 K/uL   WBC Morphology MILD LEFT SHIFT (1-5% METAS, OCC MYELO, OCC BANDS)     Comment: VACUOLATED NEUTROPHILS RARE BANDS PRESENT    Immature Granulocytes 1 %   Abs Immature Granulocytes  0.09 (H) 0.00 - 0.07 K/uL    Comment: Performed at [facility], [place] [address], [place],  [address]  Protime-INR     Status: Abnormal  Collection Time: 01/11/21 11:36 PM  Result Value Ref Range   Prothrombin Time 15.4 (H) 11.4 - 15.2 seconds   INR 1.2 0.8 - 1.2    Comment: (NOTE) INR goal varies based on device and disease states. Performed at [facility], [place] [address], [place], [place] [address]   APTT     Status: None   Collection Time: 01/11/21 11:36 PM  Result Value Ref Range   aPTT 27 24 - 36 seconds    Comment: Performed at [facility], [place] [address] [place], [place] [address]  Lipase, blood     Status: None   Collection Time: 01/11/21 11:36 PM  Result Value Ref Range   Lipase 39 11 - 51 U/L    Comment: Performed at [facility], [place] [address] [place], [place] [address]  Lactic acid, plasma     Status: None   Collection Time: 01/12/21  1:36 AM  Result Value Ref Range   Lactic Acid, Venous 1.0 0.5 - 1.9 mmol/L    Comment: Performed at [facility], [place] [address] [place], [place] [address]  CBG monitoring, ED     Status: Abnormal   Collection Time: 01/12/21  3:02 AM  Result Value Ref Range   Glucose-Capillary 136 (H) 70 - 99 mg/dL    Comment: Glucose reference range applies only to samples taken after fasting for at least 8 hours.  Urinalysis, Routine w reflex microscopic Urine, Catheterized     Status: Abnormal   Collection Time: 01/12/21  4:41 AM  Result Value Ref Range   Color, Urine YELLOW YELLOW   APPearance HAZY (A) CLEAR   Specific Gravity, Urine 1.020 1.005 - 1.030   pH 5.0 5.0 - 8.0   Glucose, UA >=500 (A) NEGATIVE mg/dL   Hgb urine dipstick SMALL (A) NEGATIVE   Bilirubin Urine NEGATIVE NEGATIVE   Ketones, ur NEGATIVE NEGATIVE mg/dL   Protein, ur >=300 (A) NEGATIVE mg/dL   Nitrite NEGATIVE NEGATIVE   Leukocytes,Ua NEGATIVE NEGATIVE   RBC / HPF  0-5 0 - 5 RBC/hpf   WBC, UA 0-5 0 - 5 WBC/hpf   Bacteria, UA RARE (A) NONE SEEN   Squamous Epithelial / LPF 0-5 0 - 5   Mucus PRESENT     Comment: Performed at [facility], [place] [address], [place], [place] [address]  MRSA PCR Screening     Status: None   Collection Time: 01/12/21  6:04 AM   Specimen: Nasal Mucosa; Nasopharyngeal  Result Value Ref Range   MRSA by PCR NEGATIVE NEGATIVE    Comment:        The GeneXpert MRSA Assay (FDA approved for NASAL specimens only), is one component of a comprehensive MRSA colonization surveillance program. It is not intended to diagnose MRSA infection nor to guide or monitor treatment for MRSA infections. Performed at [facility], [place] [address], [place], [place] [address]   Comprehensive metabolic panel     Status: Abnormal   Collection Time: 01/12/21  7:42 AM  Result Value Ref Range   Sodium 143 135 - 145 mmol/L   Potassium 3.4 (L) 3.5 - 5.1 mmol/L    Comment: DELTA CHECK NOTED   Chloride 116 (H) 98 - 111 mmol/L   CO2 17 (L) 22 - 32 mmol/L   Glucose, Bld 144 (H) 70 - 99 mg/dL    Comment: Glucose reference range applies only to samples taken after fasting for at least 8  hours.   BUN 37 (H) 8 - 23 mg/dL   Creatinine, Ser 1.68 (H) 0.44 - 1.00 mg/dL   Calcium 8.0 (L) 8.9 - 10.3 mg/dL   Total Protein 5.3 (L) 6.5 - 8.1 g/dL   Albumin 2.9 (L) 3.5 - 5.0 g/dL   AST 176 (H) 15 - 41 U/L   ALT 272 (H) 0 - 44 U/L   Alkaline Phosphatase 81 38 - 126 U/L   Total Bilirubin 0.9 0.3 - 1.2 mg/dL   GFR, Estimated 32 (L) >60 mL/min    Comment: (NOTE) Calculated using the CKD-EPI Creatinine Equation (2021)    Anion gap 10 5 - 15    Comment: Performed at [facility], [place] [address] [place], [place] [address]  CBC WITH DIFFERENTIAL     Status: Abnormal   Collection Time: 01/12/21  7:42 AM  Result Value Ref Range   WBC 16.3 (H) 4.0 - 10.5 K/uL   RBC 3.40 (L) 3.87 - 5.11 MIL/uL   Hemoglobin  10.4 (L) 12.0 - 15.0 g/dL   HCT 31.3 (L) 36.0 - 46.0 %   MCV 92.1 80.0 - 100.0 fL   MCH 30.6 26.0 - 34.0 pg   MCHC 33.2 30.0 - 36.0 g/dL   RDW 13.9 11.5 - 15.5 %   Platelets 98 (L) 150 - 400 K/uL    Comment: SPECIMEN CHECKED FOR CLOTS Immature Platelet Fraction may be clinically indicated, consider ordering this additional test [id number] REPEATED TO VERIFY    nRBC 0.0 0.0 - 0.2 %   Neutrophils Relative % 85 %   Neutro Abs 13.9 (H) 1.7 - 7.7 K/uL   Lymphocytes Relative 4 %   Lymphs Abs 0.7 0.7 - 4.0 K/uL   Monocytes Relative 6 %   Monocytes Absolute 0.9 0.1 - 1.0 K/uL   Eosinophils Relative 1 %   Eosinophils Absolute 0.1 0.0 - 0.5 K/uL   Basophils Relative 0 %   Basophils Absolute 0.1 0.0 - 0.1 K/uL   Immature Granulocytes 4 %   Abs Immature Granulocytes 0.68 (H) 0.00 - 0.07 K/uL    Comment: Performed at [facility], [place] [address] [place], [place] [address]  Glucose, capillary     Status: Abnormal   Collection Time: 01/12/21  8:29 AM  Result Value Ref Range   Glucose-Capillary 137 (H) 70 - 99 mg/dL    Comment: Glucose reference range applies only to samples taken after fasting for at least 8 hours.    CT ABDOMEN PELVIS WO CONTRAST  Result Date: 01/12/2021 CLINICAL DATA:  Dyspnea, fever, sepsis, glomerulonephritis EXAM: CT CHEST, ABDOMEN AND PELVIS WITHOUT CONTRAST TECHNIQUE: Multidetector CT imaging of the chest, abdomen and pelvis was performed following the standard protocol without IV contrast. COMPARISON:  05/26/2020 FINDINGS: CT CHEST FINDINGS Cardiovascular: Mild coronary artery calcification. Global cardiac size within normal limits. No pericardial effusion. The central pulmonary arteries are of normal caliber. Mild atherosclerotic calcification within the thoracic aorta. No aortic aneurysm. Mediastinum/Nodes: The visualized thyroid is unremarkable. No pathologic thoracic adenopathy. Moderate hiatal hernia. The esophagus demonstrates an air-fluid level  which may relate to gastroesophageal reflux or esophageal dysmotility. Lungs/Pleura: Mild bibasilar atelectasis. Trace interstitial pulmonary edema with smooth interlobular septal thickening at the lung bases and apices. No confluent pulmonary infiltrate. No pneumothorax or pleural effusion. The central airways are widely patent. Musculoskeletal: No acute bone abnormality. No suspicious lytic or blastic bone lesion. CT ABDOMEN PELVIS FINDINGS Hepatobiliary: Status post cholecystectomy. Mild pneumobilia suggests prior sphincterotomy. There is,  however, hyperdensity noted within the central biliary tree, better appreciated on coronal imaging representing a intraluminal calculus within the central left hepatic bile ducts measuring 10 mm x 18 mm. There is mild left intrahepatic biliary ductal dilation and fluid opacification of these ducts in keeping with an obstructed biliary tree. There is persistent pneumobilia within the a right hepatic ducts indicating patency of this portion of the biliary tree. The liver is otherwise unremarkable. Pancreas: Unremarkable Spleen: Unremarkable Adrenals/Urinary Tract: The adrenal glands are unremarkable. The kidneys are normal in size and position. No intrarenal or ureteral calculi. No hydronephrosis. The bladder is mildly distended, but is otherwise unremarkable. Stomach/Bowel: Extensive descending and sigmoid colonic diverticulosis. No superimposed inflammatory change. The stomach, small bowel, and large bowel are otherwise unremarkable. Appendix absent. No free intraperitoneal fluid or gas. Vascular/Lymphatic: Mild atherosclerotic calcification within the thoracic aorta. No aortic aneurysm. No pathologic adenopathy within the abdomen and pelvis. Reproductive: Status post hysterectomy. No adnexal masses. Other: Tiny fat containing umbilical hernia.  Rectum unremarkable. Musculoskeletal: No lytic or blastic bone lesion. L4-5 fusion with instrumentation has been performed. No acute  bone abnormality. IMPRESSION: Intraluminal calculi within the central left biliary tree with distal fluid opacification and dilation of the intrahepatic bile ducts within the left hepatic lobe. This may manifest clinically as sepsis secondary to ascending cholangitis. Mild coronary artery calcification. Trace interstitial pulmonary edema. Extensive distal colonic diverticulosis Aortic Atherosclerosis (ICD10-I70.0). Electronically Signed   By: Fidela Salisbury MD   On: 01/12/2021 02:44   CT CHEST WO CONTRAST  Result Date: 01/12/2021 CLINICAL DATA:  Dyspnea, fever, sepsis, glomerulonephritis EXAM: CT CHEST, ABDOMEN AND PELVIS WITHOUT CONTRAST TECHNIQUE: Multidetector CT imaging of the chest, abdomen and pelvis was performed following the standard protocol without IV contrast. COMPARISON:  05/26/2020 FINDINGS: CT CHEST FINDINGS Cardiovascular: Mild coronary artery calcification. Global cardiac size within normal limits. No pericardial effusion. The central pulmonary arteries are of normal caliber. Mild atherosclerotic calcification within the thoracic aorta. No aortic aneurysm. Mediastinum/Nodes: The visualized thyroid is unremarkable. No pathologic thoracic adenopathy. Moderate hiatal hernia. The esophagus demonstrates an air-fluid level which may relate to gastroesophageal reflux or esophageal dysmotility. Lungs/Pleura: Mild bibasilar atelectasis. Trace interstitial pulmonary edema with smooth interlobular septal thickening at the lung bases and apices. No confluent pulmonary infiltrate. No pneumothorax or pleural effusion. The central airways are widely patent. Musculoskeletal: No acute bone abnormality. No suspicious lytic or blastic bone lesion. CT ABDOMEN PELVIS FINDINGS Hepatobiliary: Status post cholecystectomy. Mild pneumobilia suggests prior sphincterotomy. There is, however, hyperdensity noted within the central biliary tree, better appreciated on coronal imaging representing a intraluminal calculus within  the central left hepatic bile ducts measuring 10 mm x 18 mm. There is mild left intrahepatic biliary ductal dilation and fluid opacification of these ducts in keeping with an obstructed biliary tree. There is persistent pneumobilia within the a right hepatic ducts indicating patency of this portion of the biliary tree. The liver is otherwise unremarkable. Pancreas: Unremarkable Spleen: Unremarkable Adrenals/Urinary Tract: The adrenal glands are unremarkable. The kidneys are normal in size and position. No intrarenal or ureteral calculi. No hydronephrosis. The bladder is mildly distended, but is otherwise unremarkable. Stomach/Bowel: Extensive descending and sigmoid colonic diverticulosis. No superimposed inflammatory change. The stomach, small bowel, and large bowel are otherwise unremarkable. Appendix absent. No free intraperitoneal fluid or gas. Vascular/Lymphatic: Mild atherosclerotic calcification within the thoracic aorta. No aortic aneurysm. No pathologic adenopathy within the abdomen and pelvis. Reproductive: Status post hysterectomy. No adnexal masses. Other: Tiny fat containing umbilical  hernia.  Rectum unremarkable. Musculoskeletal: No lytic or blastic bone lesion. L4-5 fusion with instrumentation has been performed. No acute bone abnormality. IMPRESSION: Intraluminal calculi within the central left biliary tree with distal fluid opacification and dilation of the intrahepatic bile ducts within the left hepatic lobe. This may manifest clinically as sepsis secondary to ascending cholangitis. Mild coronary artery calcification. Trace interstitial pulmonary edema. Extensive distal colonic diverticulosis Aortic Atherosclerosis (ICD10-I70.0). Electronically Signed   By: Fidela Salisbury MD   On: 01/12/2021 02:44   DG Chest Port 1 View  Result Date: 01/12/2021 CLINICAL DATA:  Vomiting and fever EXAM: PORTABLE CHEST 1 VIEW COMPARISON:  12/25/2017 FINDINGS: The heart size and mediastinal contours are within  normal limits. Both lungs are clear. The visualized skeletal structures are unremarkable. IMPRESSION: No active disease. Electronically Signed   By: Ulyses Jarred M.D.   On: 01/12/2021 00:17    Review of Systems negative except above Blood pressure (!) 84/41, pulse 80, temperature 98 F (36.7 C), temperature source Oral, resp. rate 17, height 5\' 3"  (1.6 m), weight 85.1 kg, SpO2 97 %. Physical Exam blood pressure low other vital signs stable alert and oriented now exam please see preassessment evaluation no guarding or rebound or significant pain on exam labs and x-rays reviewed  Assessment/Plan: Probable CBD stone with cholangitis Plan: The risk benefits methods and success rate was rediscussed with the patient as well as placing a temporary stent which she has had before and will proceed with anesthesia assistance this morning with further work-up and plans pending those findings  Coleson Kant E 01/12/2021, 9:33 AM

## 2021-01-13 ENCOUNTER — Other Ambulatory Visit: Payer: Self-pay

## 2021-01-13 LAB — URINE CULTURE: Culture: NO GROWTH

## 2021-01-13 LAB — COMPREHENSIVE METABOLIC PANEL
ALT: 194 U/L — ABNORMAL HIGH (ref 0–44)
AST: 94 U/L — ABNORMAL HIGH (ref 15–41)
Albumin: 3.1 g/dL — ABNORMAL LOW (ref 3.5–5.0)
Alkaline Phosphatase: 76 U/L (ref 38–126)
Anion gap: 10 (ref 5–15)
BUN: 46 mg/dL — ABNORMAL HIGH (ref 8–23)
CO2: 18 mmol/L — ABNORMAL LOW (ref 22–32)
Calcium: 7.8 mg/dL — ABNORMAL LOW (ref 8.9–10.3)
Chloride: 112 mmol/L — ABNORMAL HIGH (ref 98–111)
Creatinine, Ser: 1.99 mg/dL — ABNORMAL HIGH (ref 0.44–1.00)
GFR, Estimated: 26 mL/min — ABNORMAL LOW (ref >60)
Glucose, Bld: 143 mg/dL — ABNORMAL HIGH (ref 70–99)
Potassium: 4.5 mmol/L (ref 3.5–5.1)
Sodium: 140 mmol/L (ref 135–145)
Total Bilirubin: 0.8 mg/dL (ref 0.3–1.2)
Total Protein: 5.5 g/dL — ABNORMAL LOW (ref 6.5–8.1)

## 2021-01-13 LAB — CBC WITH DIFFERENTIAL/PLATELET
Abs Immature Granulocytes: 0.5 10*3/uL — ABNORMAL HIGH (ref 0.00–0.07)
Basophils Absolute: 0 10*3/uL (ref 0.0–0.1)
Basophils Relative: 0 %
Eosinophils Absolute: 0 10*3/uL (ref 0.0–0.5)
Eosinophils Relative: 0 %
HCT: 32.8 % — ABNORMAL LOW (ref 36.0–46.0)
Hemoglobin: 10.6 g/dL — ABNORMAL LOW (ref 12.0–15.0)
Immature Granulocytes: 3 %
Lymphocytes Relative: 7 %
Lymphs Abs: 1.1 10*3/uL (ref 0.7–4.0)
MCH: 30 pg (ref 26.0–34.0)
MCHC: 32.3 g/dL (ref 30.0–36.0)
MCV: 92.9 fL (ref 80.0–100.0)
Monocytes Absolute: 0.6 10*3/uL (ref 0.1–1.0)
Monocytes Relative: 4 %
Neutro Abs: 14.3 10*3/uL — ABNORMAL HIGH (ref 1.7–7.7)
Neutrophils Relative %: 86 %
Platelets: 93 10*3/uL — ABNORMAL LOW (ref 150–400)
RBC: 3.53 MIL/uL — ABNORMAL LOW (ref 3.87–5.11)
RDW: 14.6 % (ref 11.5–15.5)
WBC: 16.5 10*3/uL — ABNORMAL HIGH (ref 4.0–10.5)
nRBC: 0 % (ref 0.0–0.2)

## 2021-01-13 LAB — GLUCOSE, CAPILLARY
Glucose-Capillary: 118 mg/dL — ABNORMAL HIGH (ref 70–99)
Glucose-Capillary: 139 mg/dL — ABNORMAL HIGH (ref 70–99)
Glucose-Capillary: 143 mg/dL — ABNORMAL HIGH (ref 70–99)
Glucose-Capillary: 143 mg/dL — ABNORMAL HIGH (ref 70–99)
Glucose-Capillary: 152 mg/dL — ABNORMAL HIGH (ref 70–99)
Glucose-Capillary: 157 mg/dL — ABNORMAL HIGH (ref 70–99)
Glucose-Capillary: 159 mg/dL — ABNORMAL HIGH (ref 70–99)

## 2021-01-13 LAB — PROCALCITONIN: Procalcitonin: 47.2 ng/mL

## 2021-01-13 MED ORDER — DEXTROSE 5 % IV SOLN: INTRAVENOUS | Status: DC

## 2021-01-13 MED ORDER — GABAPENTIN 300 MG PO CAPS
300.0000 mg | ORAL_CAPSULE | Freq: Every day | ORAL | Status: DC
  Administered 2021-01-13 – 2021-01-16 (×4): 300 mg via ORAL
  Filled 2021-01-13 (×4): qty 1

## 2021-01-13 MED ORDER — HYDRALAZINE HCL 10 MG PO TABS
10.0000 mg | ORAL_TABLET | Freq: Four times a day (QID) | ORAL | Status: DC
  Administered 2021-01-13 – 2021-01-14 (×4): 10 mg via ORAL
  Filled 2021-01-13 (×4): qty 1

## 2021-01-13 MED ORDER — CHOLESTYRAMINE LIGHT 4 G PO PACK
4.0000 g | PACK | ORAL | Status: DC
  Administered 2021-01-13: 4 g via ORAL
  Filled 2021-01-13: qty 1

## 2021-01-13 MED ORDER — GABAPENTIN 300 MG PO CAPS: 300.0000 mg | ORAL_CAPSULE | ORAL | Status: DC

## 2021-01-13 MED ORDER — VENLAFAXINE HCL ER 75 MG PO CP24
75.0000 mg | ORAL_CAPSULE | Freq: Every morning | ORAL | Status: DC
  Administered 2021-01-13 – 2021-01-16 (×4): 75 mg via ORAL
  Filled 2021-01-13 (×4): qty 1

## 2021-01-13 MED ORDER — GABAPENTIN 300 MG PO CAPS
600.0000 mg | ORAL_CAPSULE | Freq: Every day | ORAL | Status: DC
  Administered 2021-01-13 – 2021-01-16 (×4): 600 mg via ORAL
  Filled 2021-01-13 (×4): qty 2

## 2021-01-13 NOTE — Progress Notes (Signed)
PROGRESS NOTE    Tina Briggs  NKN:[id number] DOB: 02-Sep-1946 DOA: 01/11/2021 PCP: Wenda Low, MD   Brief Narrative:  Per admitting physician: Genevive Bi a 74 y.o.femalewith medical history significant forchronic kidney disease, type 2 diabetes mellitus, hypertension, remote cholecystectomy, and choledocholithiasis in 2016 status post ERCP with sphincterotomy and stone extraction, now presenting to emergency department with right upper quadrant pain, fevers, nausea, vomiting, and confusion. The patient reports that she developed right upper quadrant pain and nausea 1 week ago which resolved before returning again and then becoming more constant and severe over the past 3 days. She has not eaten in the past day due to nausea and has developed progressive pain, fevers, lethargy, and confusion. She felt too weak to sit up last night and EMS was called. She was found to have temperature of 103.9 F.She was given 1 L of IV fluids by EMS prior to arrival in the ED. She denies any chest pain, cough, shortness of breath, or diarrhea.  In the ER patient was noted to have clinical findings concerning for ascending cholangitis.  She was placed on broad-spectrum antibiotics, blood cultures were collected, fluids were administered, GI was informed, with plans for ERCP    Assessment & Plan:   Principal Problem:   Ascending cholangitis Active Problems:   Essential hypertension   Diabetes mellitus type 2 in obese (HCC)   Chronic kidney disease, stage 3 (HCC)   Sepsis with acute hypoxic respiratory failure and septic shock (HCC)   Acute encephalopathy   Thrombocytopenia (HCC)   Acute respiratory failure with hypoxia ([place])   1.Acute cholangitis; septic shock -Presents with RUQ pain, fevers, confusion, and found on CT to have calculi in central biliary tree on left with intrahepatic biliary dilation on left and elevated LFTs in hepatocellular pattern  - She was given  1 liter IVF with EMS -Blood cultures collected in ED, 2 more liters IVF given, and meropenem started -Confusion resolved and BP was improving but trended back down; she has received 3 liters of IVF, has new supplemental O2 requirement, and developing interstitial edema noted on CT  - Appreciate GI assist,  now status post RCP with removal of stone -Continue meropenem, continue albumin, started norepinephrine but has been since weaned off  2.Renally insufficiency -SCr is 1.70 on admission, up from 1.05 in January 2020 -Progression in CKD vs AKI -Hold ARB, renally-dose medications, monitor  3.Acute encephalopathy -Resolved -Presented with confusion which was likely secondary to acute infection and has resolved  4.Thrombocytopenia -Platelets 110k on admission now 66 -Likely secondary to infection -Treat infection and monitor  5.Hypertension -Now hypotensive -Hold antihypertensives  6.Type II DM -No recent A1c in EMR -Check CBGs and use a low-intensity SSI   7. Acute hypoxic respiratory failure  -Patient denies SOB, chest pain, or cough but now requiring 4 Lpm supplemental O2 and has developing edema on chest CT -EF was normal on remote echo  - Cautious with IVF, update echo, continue supplemental O2 as needed  8. Bradycardia with pause: -Patient noted to be bradycardic post procedure -Monitored for anesthesia washout and post procedure recovery but had persistent bradycardia as well as a 2.9-second pause -Cardiology saw her in consultation, Added hydralazine -We will continue to follow with recommendations for ischemic work-up per cards  9. Bacteremia: -Follow cultures, continue antibiotics -Once speciation and sensitivities established will discuss with ID  DVT prophylaxis: Heparin SQ  Code Status: full    Code Status Orders  (From admission, onward)  Start     Ordered   01/12/21 0612  Full code   Continuous        01/12/21 0613        Code Status History    Date Active Date Inactive Code Status Order ID Comments User Context   10/15/2018 0936 10/20/2018 1349 Full Code [id number]  Karmen Bongo, MD ED   09/16/2018 1809 09/17/2018 1457 Full Code [id number]  Eustace Moore, MD Inpatient   12/31/2017 1941 01/01/2018 1804 Full Code [id number]  Eustace Moore, MD Inpatient   06/02/2015 1744 06/07/2015 1432 Full Code [id number]  Hosie Poisson, MD Inpatient   05/24/2015 2214 05/27/2015 1703 Full Code [id number]  Theressa Millard, MD Inpatient   Advance Care Planning Activity    Advance Directive Documentation   Flowsheet Row Most Recent Value  Type of Advance Directive Healthcare Power of Attorney  Pre-existing out of facility DNR order (yellow form or pink MOST form) --  "MOST" Form in Place? --     Family Communication: none today  Disposition Plan:   Patient remained inpatient for continued evaluation and treatment of bradycardia, sinus pauses, IV antibiotics.  Patient not yet stable for discharge Consults called: None Admission status: Inpatient   Consultants:   GI, critical care, cardiology  Procedures:  CT ABDOMEN PELVIS WO CONTRAST  Result Date: 01/12/2021 CLINICAL DATA:  Dyspnea, fever, sepsis, glomerulonephritis EXAM: CT CHEST, ABDOMEN AND PELVIS WITHOUT CONTRAST TECHNIQUE: Multidetector CT imaging of the chest, abdomen and pelvis was performed following the standard protocol without IV contrast. COMPARISON:  05/26/2020 FINDINGS: CT CHEST FINDINGS Cardiovascular: Mild coronary artery calcification. Global cardiac size within normal limits. No pericardial effusion. The central pulmonary arteries are of normal caliber. Mild atherosclerotic calcification within the thoracic aorta. No aortic aneurysm. Mediastinum/Nodes: The visualized thyroid is unremarkable. No pathologic thoracic adenopathy. Moderate hiatal hernia. The esophagus demonstrates an air-fluid level which may relate to  gastroesophageal reflux or esophageal dysmotility. Lungs/Pleura: Mild bibasilar atelectasis. Trace interstitial pulmonary edema with smooth interlobular septal thickening at the lung bases and apices. No confluent pulmonary infiltrate. No pneumothorax or pleural effusion. The central airways are widely patent. Musculoskeletal: No acute bone abnormality. No suspicious lytic or blastic bone lesion. CT ABDOMEN PELVIS FINDINGS Hepatobiliary: Status post cholecystectomy. Mild pneumobilia suggests prior sphincterotomy. There is, however, hyperdensity noted within the central biliary tree, better appreciated on coronal imaging representing a intraluminal calculus within the central left hepatic bile ducts measuring 10 mm x 18 mm. There is mild left intrahepatic biliary ductal dilation and fluid opacification of these ducts in keeping with an obstructed biliary tree. There is persistent pneumobilia within the a right hepatic ducts indicating patency of this portion of the biliary tree. The liver is otherwise unremarkable. Pancreas: Unremarkable Spleen: Unremarkable Adrenals/Urinary Tract: The adrenal glands are unremarkable. The kidneys are normal in size and position. No intrarenal or ureteral calculi. No hydronephrosis. The bladder is mildly distended, but is otherwise unremarkable. Stomach/Bowel: Extensive descending and sigmoid colonic diverticulosis. No superimposed inflammatory change. The stomach, small bowel, and large bowel are otherwise unremarkable. Appendix absent. No free intraperitoneal fluid or gas. Vascular/Lymphatic: Mild atherosclerotic calcification within the thoracic aorta. No aortic aneurysm. No pathologic adenopathy within the abdomen and pelvis. Reproductive: Status post hysterectomy. No adnexal masses. Other: Tiny fat containing umbilical hernia.  Rectum unremarkable. Musculoskeletal: No lytic or blastic bone lesion. L4-5 fusion with instrumentation has been performed. No acute bone abnormality.  IMPRESSION: Intraluminal calculi within the central left biliary tree with distal fluid opacification  and dilation of the intrahepatic bile ducts within the left hepatic lobe. This may manifest clinically as sepsis secondary to ascending cholangitis. Mild coronary artery calcification. Trace interstitial pulmonary edema. Extensive distal colonic diverticulosis Aortic Atherosclerosis (ICD10-I70.0). Electronically Signed   By: Fidela Salisbury MD   On: 01/12/2021 02:44   CT CHEST WO CONTRAST  Result Date: 01/12/2021 CLINICAL DATA:  Dyspnea, fever, sepsis, glomerulonephritis EXAM: CT CHEST, ABDOMEN AND PELVIS WITHOUT CONTRAST TECHNIQUE: Multidetector CT imaging of the chest, abdomen and pelvis was performed following the standard protocol without IV contrast. COMPARISON:  05/26/2020 FINDINGS: CT CHEST FINDINGS Cardiovascular: Mild coronary artery calcification. Global cardiac size within normal limits. No pericardial effusion. The central pulmonary arteries are of normal caliber. Mild atherosclerotic calcification within the thoracic aorta. No aortic aneurysm. Mediastinum/Nodes: The visualized thyroid is unremarkable. No pathologic thoracic adenopathy. Moderate hiatal hernia. The esophagus demonstrates an air-fluid level which may relate to gastroesophageal reflux or esophageal dysmotility. Lungs/Pleura: Mild bibasilar atelectasis. Trace interstitial pulmonary edema with smooth interlobular septal thickening at the lung bases and apices. No confluent pulmonary infiltrate. No pneumothorax or pleural effusion. The central airways are widely patent. Musculoskeletal: No acute bone abnormality. No suspicious lytic or blastic bone lesion. CT ABDOMEN PELVIS FINDINGS Hepatobiliary: Status post cholecystectomy. Mild pneumobilia suggests prior sphincterotomy. There is, however, hyperdensity noted within the central biliary tree, better appreciated on coronal imaging representing a intraluminal calculus within the central left  hepatic bile ducts measuring 10 mm x 18 mm. There is mild left intrahepatic biliary ductal dilation and fluid opacification of these ducts in keeping with an obstructed biliary tree. There is persistent pneumobilia within the a right hepatic ducts indicating patency of this portion of the biliary tree. The liver is otherwise unremarkable. Pancreas: Unremarkable Spleen: Unremarkable Adrenals/Urinary Tract: The adrenal glands are unremarkable. The kidneys are normal in size and position. No intrarenal or ureteral calculi. No hydronephrosis. The bladder is mildly distended, but is otherwise unremarkable. Stomach/Bowel: Extensive descending and sigmoid colonic diverticulosis. No superimposed inflammatory change. The stomach, small bowel, and large bowel are otherwise unremarkable. Appendix absent. No free intraperitoneal fluid or gas. Vascular/Lymphatic: Mild atherosclerotic calcification within the thoracic aorta. No aortic aneurysm. No pathologic adenopathy within the abdomen and pelvis. Reproductive: Status post hysterectomy. No adnexal masses. Other: Tiny fat containing umbilical hernia.  Rectum unremarkable. Musculoskeletal: No lytic or blastic bone lesion. L4-5 fusion with instrumentation has been performed. No acute bone abnormality. IMPRESSION: Intraluminal calculi within the central left biliary tree with distal fluid opacification and dilation of the intrahepatic bile ducts within the left hepatic lobe. This may manifest clinically as sepsis secondary to ascending cholangitis. Mild coronary artery calcification. Trace interstitial pulmonary edema. Extensive distal colonic diverticulosis Aortic Atherosclerosis (ICD10-I70.0). Electronically Signed   By: Fidela Salisbury MD   On: 01/12/2021 02:44   DG Chest Port 1 View  Result Date: 01/12/2021 CLINICAL DATA:  Vomiting and fever EXAM: PORTABLE CHEST 1 VIEW COMPARISON:  12/25/2017 FINDINGS: The heart size and mediastinal contours are within normal limits. Both  lungs are clear. The visualized skeletal structures are unremarkable. IMPRESSION: No active disease. Electronically Signed   By: Ulyses Jarred M.D.   On: 01/12/2021 00:17   DG ERCP BILIARY & PANCREATIC DUCTS  Result Date: 01/12/2021 CLINICAL DATA:  Choledocholithiasis EXAM: ERCP TECHNIQUE: Multiple spot images obtained with the fluoroscopic device and submitted for interpretation post-procedure. FLUOROSCOPY TIME:  Fluoroscopy Time:  5 minutes 32 seconds Radiation Exposure Index (if provided by the fluoroscopic device): 124.42 mGy COMPARISON:  CT abdomen/pelvis 01/12/2021 FINDINGS: Five intraoperative saved images are submitted for review. The images demonstrate a flexible endoscope in the descending duodenum with wire cannulation of the right intrahepatic ducts. Cholangiogram demonstrates mild biliary ductal dilatation. Filling defects in the distal common bile duct consistent with choledocholithiasis. Subsequent images demonstrate sphincterotomy and balloon sweep of the common duct. IMPRESSION: 1. Choledocholithiasis. 2. ERCP with sphincterotomy and balloon sweeping of the common duct. These images were submitted for radiologic interpretation only. Please see the procedural report for the amount of contrast and the fluoroscopy time utilized. Electronically Signed   By: Jacqulynn Cadet M.D.   On: 01/12/2021 10:49   ECHOCARDIOGRAM COMPLETE  Result Date: 01/12/2021    ECHOCARDIOGRAM REPORT   Patient Name:   Tina Briggs Date of Exam: 01/12/2021 Medical Rec #:  [id number]           Height:       63.0 in Accession #:    [id number]          Weight:       187.6 lb Date of Birth:  1946/06/23          BSA:          1.882 m Patient Age:    58 years            BP:           124/57 mmHg Patient Gender: F                   HR:           56 bpm. Exam Location:  Inpatient Procedure: 2D Echo, Cardiac Doppler and Color Doppler Indications:    Dyspnea R06.00  History:        Patient has no prior history of  Echocardiogram examinations.                 Risk Factors:Hypertension, Diabetes, Dyslipidemia and GERD.  Sonographer:    Jonelle Sidle Dance Referring Phys: [id number] [place]  1. Left ventricular ejection fraction, by estimation, is 60 to 65%. The left ventricle has normal function. The left ventricle has no regional wall motion abnormalities. Left ventricular diastolic parameters are consistent with Grade II diastolic dysfunction (pseudonormalization).  2. Right ventricular systolic function is normal. The right ventricular size is normal. There is normal pulmonary artery systolic pressure.  3. Left atrial size was moderately dilated.  4. The mitral valve is grossly normal. Trivial mitral valve regurgitation. Moderate mitral annular calcification.  5. Tricuspid valve regurgitation is mild to moderate.  6. The aortic valve is tricuspid. There is mild calcification of the aortic valve. There is mild thickening of the aortic valve. Aortic valve regurgitation is not visualized. Mild aortic valve sclerosis is present, with no evidence of aortic valve stenosis.  7. The inferior vena cava is dilated in size with >50% respiratory variability, suggesting right atrial pressure of 8 mmHg. FINDINGS  Left Ventricle: Left ventricular ejection fraction, by estimation, is 60 to 65%. The left ventricle has normal function. The left ventricle has no regional wall motion abnormalities. The left ventricular internal cavity size was normal in size. There is  borderline left ventricular hypertrophy. Left ventricular diastolic parameters are consistent with Grade II diastolic dysfunction (pseudonormalization). Right Ventricle: The right ventricular size is normal. Right vetricular wall thickness was not well visualized. Right ventricular systolic function is normal. There is normal pulmonary artery systolic pressure. The tricuspid regurgitant velocity is 2.31 m/s, and with an assumed  right atrial pressure of 8 mmHg, the  estimated right ventricular systolic pressure is 16.0 mmHg. Left Atrium: Left atrial size was moderately dilated. Right Atrium: Right atrial size was normal in size. Pericardium: There is no evidence of pericardial effusion. Mitral Valve: The mitral valve is grossly normal. There is mild thickening of the mitral valve leaflet(s). There is mild calcification of the mitral valve leaflet(s). Moderate mitral annular calcification. Trivial mitral valve regurgitation. Tricuspid Valve: The tricuspid valve is normal in structure. Tricuspid valve regurgitation is mild to moderate. Aortic Valve: Focal calcification of NCC. The aortic valve is tricuspid. There is mild calcification of the aortic valve. There is mild thickening of the aortic valve. There is mild aortic valve annular calcification. Aortic valve regurgitation is not visualized. Mild aortic valve sclerosis is present, with no evidence of aortic valve stenosis. Pulmonic Valve: The pulmonic valve was grossly normal. Pulmonic valve regurgitation is mild. No evidence of pulmonic stenosis. Aorta: The aortic root, ascending aorta, aortic arch and descending aorta are all structurally normal, with no evidence of dilitation or obstruction. Venous: The inferior vena cava is dilated in size with greater than 50% respiratory variability, suggesting right atrial pressure of 8 mmHg. IAS/Shunts: The atrial septum is grossly normal.  LEFT VENTRICLE PLAX 2D LVIDd:         3.50 cm  Diastology LVIDs:         2.30 cm  LV e' medial:    7.94 cm/s LV PW:         1.20 cm  LV E/e' medial:  16.2 LV IVS:        1.10 cm  LV e' lateral:   8.92 cm/s LVOT diam:     1.90 cm  LV E/e' lateral: 14.5 LV SV:         58 LV SV Index:   31 LVOT Area:     2.84 cm  RIGHT VENTRICLE             IVC RV Basal diam:  2.30 cm     IVC diam: 2.50 cm RV S prime:     10.00 cm/s TAPSE (M-mode): 2.1 cm LEFT ATRIUM             Index       RIGHT ATRIUM           Index LA diam:        4.20 cm 2.23 cm/m  RA Area:      11.00 cm LA Vol (A2C):   83.9 ml 44.58 ml/m RA Volume:   22.80 ml  12.12 ml/m LA Vol (A4C):   64.6 ml 34.33 ml/m LA Biplane Vol: 73.8 ml 39.22 ml/m  AORTIC VALVE LVOT Vmax:   80.40 cm/s LVOT Vmean:  55.000 cm/s LVOT VTI:    0.206 m  AORTA Ao Root diam: 3.10 cm Ao Asc diam:  3.40 cm MITRAL VALVE                TRICUSPID VALVE MV Area (PHT): 3.91 cm     TR Peak grad:   21.3 mmHg MV Decel Time: 194 msec     TR Vmax:        231.00 cm/s MV E velocity: 129.00 cm/s MV A velocity: 73.10 cm/s   SHUNTS MV E/A ratio:  1.76         Systemic VTI:  0.21 m  Systemic Diam: 1.90 cm Buford Dresser MD Electronically signed by Buford Dresser MD Signature Date/Time: 01/12/2021/7:03:04 PM    Final      Antimicrobials:   Meropenem   Subjective: Reports "I feel like I been run over by a truck" Otherwise no acute postoperative complications  Objective: Vitals:   01/13/21 0500 01/13/21 0629 01/13/21 0700 01/13/21 0800  BP: 125/79 134/73 (!) 144/64   Pulse: (!) 44 (!) 46 (!) 44   Resp: 12 15 14    Temp:    97.8 F (36.6 C)  TempSrc:    Oral  SpO2: 99% 98% 98%   Weight:      Height:        Intake/Output Summary (Last 24 hours) at 01/13/2021 1056 Last data filed at 01/12/2021 2029 Gross per 24 hour  Intake 894.98 ml  Output --  Net 894.98 ml   Filed Weights   01/12/21 0012 01/12/21 0600 01/12/21 0934  Weight: 80.7 kg 85.1 kg 85.1 kg    Examination:  General exam: Appears calm and comfortable  Respiratory system: Clear to auscultation. Respiratory effort normal. Cardiovascular system: S1 & S2 heard, RRR. No JVD, murmurs, rubs, gallops or clicks. No pedal edema. Gastrointestinal system: Abdomen is nondistended, soft and mildly tender right upper quadrant post procedure no organomegaly or masses felt. Normal bowel sounds heard. Central nervous system: Alert and oriented. No focal neurological deficits. Extremities: Symmetric 5 x 5 power.  Warm well perfused no  edema Skin: No rashes, lesions or ulcers Psychiatry: Judgement and insight appear normal. Mood & affect appropriate.     Data Reviewed: I have personally reviewed following labs and imaging studies  CBC: Recent Labs  Lab 01/11/21 2336 01/12/21 0742 01/13/21 0548  WBC 9.5 16.3* 16.5*  NEUTROABS 8.7* 13.9* 14.3*  HGB 12.2 10.4* 10.6*  HCT 37.5 31.3* 32.8*  MCV 91.7 92.1 92.9  PLT 110* 98* 93*   Basic Metabolic Panel: Recent Labs  Lab 01/11/21 2336 01/12/21 0742 01/13/21 0548  NA 141 143 140  K 4.1 3.4* 4.5  CL 113* 116* 112*  CO2 20* 17* 18*  GLUCOSE 154* 144* 143*  BUN 33* 37* 46*  CREATININE 1.70* 1.68* 1.99*  CALCIUM 8.3* 8.0* 7.8*   GFR: Estimated Creatinine Clearance: 25.6 mL/min (A) (by C-G formula based on SCr of 1.99 mg/dL (H)). Liver Function Tests: Recent Labs  Lab 01/11/21 2336 01/12/21 0742 01/13/21 0548  AST 330* 176* 94*  ALT 425* 272* 194*  ALKPHOS 102 81 76  BILITOT 1.3* 0.9 0.8  PROT 5.8* 5.3* 5.5*  ALBUMIN 3.1* 2.9* 3.1*   Recent Labs  Lab 01/11/21 2336  LIPASE 39   No results for input(s): AMMONIA in the last 168 hours. Coagulation Profile: Recent Labs  Lab 01/11/21 2336  INR 1.2   Cardiac Enzymes: No results for input(s): CKTOTAL, CKMB, CKMBINDEX, TROPONINI in the last 168 hours. BNP (last 3 results) No results for input(s): PROBNP in the last 8760 hours. HbA1C: No results for input(s): HGBA1C in the last 72 hours. CBG: Recent Labs  Lab 01/12/21 1635 01/12/21 2004 01/13/21 0005 01/13/21 0320 01/13/21 0814  GLUCAP 180* 191* 159* 143* 143*   Lipid Profile: No results for input(s): CHOL, HDL, LDLCALC, TRIG, CHOLHDL, LDLDIRECT in the last 72 hours. Thyroid Function Tests: No results for input(s): TSH, T4TOTAL, FREET4, T3FREE, THYROIDAB in the last 72 hours. Anemia Panel: No results for input(s): VITAMINB12, FOLATE, FERRITIN, TIBC, IRON, RETICCTPCT in the last 72 hours. Sepsis Labs: Recent Labs  Lab 01/11/21  2336  01/12/21 0136 01/12/21 0742 01/13/21 0254  PROCALCITON  --   --  53.72 47.20  LATICACIDVEN 2.6* 1.0  --   --     Recent Results (from the past 240 hour(s))  Resp Panel by RT-PCR (Flu A&B, Covid) Nasopharyngeal Swab     Status: None   Collection Time: 01/11/21 11:36 PM   Specimen: Nasopharyngeal Swab; Nasopharyngeal(NP) swabs in vial transport medium  Result Value Ref Range Status   SARS Coronavirus 2 by RT PCR NEGATIVE NEGATIVE Final    Comment: (NOTE) SARS-CoV-2 target nucleic acids are NOT DETECTED.  The SARS-CoV-2 RNA is generally detectable in upper respiratory specimens during the acute phase of infection. The lowest concentration of SARS-CoV-2 viral copies this assay can detect is 138 copies/mL. A negative result does not preclude SARS-Cov-2 infection and should not be used as the sole basis for treatment or other patient management decisions. A negative result may occur with  improper specimen collection/handling, submission of specimen other than nasopharyngeal swab, presence of viral mutation(s) within the areas targeted by this assay, and inadequate number of viral copies(<138 copies/mL). A negative result must be combined with clinical observations, patient history, and epidemiological information. The expected result is Negative.  Fact Sheet for Patients:  EntrepreneurPulse.com.au  Fact Sheet for Healthcare Providers:  IncredibleEmployment.be  This test is no t yet approved or cleared by the [place] FDA and  has been authorized for detection and/or diagnosis of SARS-CoV-2 by FDA under an Emergency Use Authorization (EUA). This EUA will remain  in effect (meaning this test can be used) for the duration of the COVID-19 declaration under Section 564(b)(1) of the Act, 21 U.S.C.section 360bbb-3(b)(1), unless the authorization is terminated  or revoked sooner.       Influenza A by PCR NEGATIVE NEGATIVE Final   Influenza B  by PCR NEGATIVE NEGATIVE Final    Comment: (NOTE) The Xpert Xpress SARS-CoV-2/FLU/RSV plus assay is intended as an aid in the diagnosis of influenza from Nasopharyngeal swab specimens and should not be used as a sole basis for treatment. Nasal washings and aspirates are unacceptable for Xpert Xpress SARS-CoV-2/FLU/RSV testing.  Fact Sheet for Patients: EntrepreneurPulse.com.au  Fact Sheet for Healthcare Providers: IncredibleEmployment.be  This test is not yet approved or cleared by the [place] FDA and has been authorized for detection and/or diagnosis of SARS-CoV-2 by FDA under an Emergency Use Authorization (EUA). This EUA will remain in effect (meaning this test can be used) for the duration of the COVID-19 declaration under Section 564(b)(1) of the Act, 21 U.S.C. section 360bbb-3(b)(1), unless the authorization is terminated or revoked.  Performed at [facility], [place] [address] [place], [place] [address]   Blood Culture (routine x 2)     Status: None (Preliminary result)   Collection Time: 01/11/21 11:36 PM   Specimen: BLOOD  Result Value Ref Range Status   Specimen Description   Final    BLOOD BLOOD LEFT HAND Performed at [facility] [address], [place], [place] [address]    Special Requests   Final    BOTTLES DRAWN AEROBIC ONLY Blood Culture results may not be optimal due to an inadequate volume of blood received in culture bottles Performed at [facility] [address], [place], [place] [address]    Culture  Setup Time   Final    GRAM NEGATIVE RODS AEROBIC BOTTLE ONLY CRITICAL VALUE NOTED.  VALUE IS CONSISTENT WITH PREVIOUSLY REPORTED AND CALLED VALUE. Performed  at [facility], [place] [address] [place], [place] [address]    Culture GRAM NEGATIVE RODS  Final   Report Status PENDING  Incomplete  Blood Culture (routine x 2)     Status: None (Preliminary  result)   Collection Time: 01/11/21 11:41 PM   Specimen: BLOOD  Result Value Ref Range Status   Specimen Description   Final    BLOOD LEFT ANTECUBITAL Performed at [facility] [address] [place], [place] [address]    Special Requests   Final    BOTTLES DRAWN AEROBIC AND ANAEROBIC Blood Culture adequate volume Performed at [facility] [address], [place], [place] [address]    Culture  Setup Time   Final    GRAM NEGATIVE RODS IN BOTH AEROBIC AND ANAEROBIC BOTTLES CRITICAL RESULT CALLED TO, READ BACK BY AND VERIFIED WITH: TERRY GREEN PHARMD @1712  01/12/21 EB Performed at [facility], [place] [address] [place],  [address]    Culture GRAM NEGATIVE RODS  Final   Report Status PENDING  Incomplete  Blood Culture ID Panel (Reflexed)     Status: Abnormal   Collection Time: 01/11/21 11:41 PM  Result Value Ref Range Status   Enterococcus faecalis NOT DETECTED NOT DETECTED Final   Enterococcus Faecium NOT DETECTED NOT DETECTED Final   Listeria monocytogenes NOT DETECTED NOT DETECTED Final   Staphylococcus species NOT DETECTED NOT DETECTED Final   Staphylococcus aureus (BCID) NOT DETECTED NOT DETECTED Final   Staphylococcus epidermidis NOT DETECTED NOT DETECTED Final   Staphylococcus lugdunensis NOT DETECTED NOT DETECTED Final   Streptococcus species NOT DETECTED NOT DETECTED Final   Streptococcus agalactiae NOT DETECTED NOT DETECTED Final   Streptococcus pneumoniae NOT DETECTED NOT DETECTED Final   Streptococcus pyogenes NOT DETECTED NOT DETECTED Final   A.calcoaceticus-baumannii NOT DETECTED NOT DETECTED Final   Bacteroides fragilis NOT DETECTED NOT DETECTED Final   Enterobacterales DETECTED (A) NOT DETECTED Final    Comment: CRITICAL RESULT CALLED TO, READ BACK BY AND VERIFIED WITH: TERRY GREEN PHARMD @1712  01/12/21 EB    Enterobacter cloacae complex NOT DETECTED NOT DETECTED Final   Escherichia coli DETECTED (A) NOT DETECTED  Final    Comment: CRITICAL RESULT CALLED TO, READ BACK BY AND VERIFIED WITH: TERRY GREEN PHARMD @1712  01/12/21 EB    Klebsiella aerogenes NOT DETECTED NOT DETECTED Final   Klebsiella oxytoca DETECTED (A) NOT DETECTED Final    Comment: CRITICAL RESULT CALLED TO, READ BACK BY AND VERIFIED WITH: TERRY GREEN PHARMD @1712  01/12/21 EB    Klebsiella pneumoniae NOT DETECTED NOT DETECTED Final   Proteus species NOT DETECTED NOT DETECTED Final   Salmonella species NOT DETECTED NOT DETECTED Final   Serratia marcescens NOT DETECTED NOT DETECTED Final   Haemophilus influenzae NOT DETECTED NOT DETECTED Final   Neisseria meningitidis NOT DETECTED NOT DETECTED Final   Pseudomonas aeruginosa NOT DETECTED NOT DETECTED Final   Stenotrophomonas maltophilia NOT DETECTED NOT DETECTED Final   Candida albicans NOT DETECTED NOT DETECTED Final   Candida auris NOT DETECTED NOT DETECTED Final   Candida glabrata NOT DETECTED NOT DETECTED Final   Candida krusei NOT DETECTED NOT DETECTED Final   Candida parapsilosis NOT DETECTED NOT DETECTED Final   Candida tropicalis NOT DETECTED NOT DETECTED Final   Cryptococcus neoformans/gattii NOT DETECTED NOT DETECTED Final   CTX-M ESBL NOT DETECTED NOT DETECTED Final   Carbapenem resistance IMP NOT DETECTED NOT DETECTED Final   Carbapenem resistance KPC NOT DETECTED NOT DETECTED  Final   Carbapenem resistance NDM NOT DETECTED NOT DETECTED Final   Carbapenem resist OXA 48 LIKE NOT DETECTED NOT DETECTED Final   Carbapenem resistance VIM NOT DETECTED NOT DETECTED Final    Comment: Performed at [facility], [place] [address] [place], [place] [address]  Urine culture     Status: None   Collection Time: 01/12/21  4:41 AM   Specimen: In/Out Cath Urine  Result Value Ref Range Status   Specimen Description   Final    IN/OUT CATH URINE Performed at [facility] [address], [place], [place] [address]    Special Requests   Final    NONE Performed at  [facility], [place] [address] [place], [place] [address]    Culture   Final    NO GROWTH Performed at [facility], [place] [address] [place], [place] [address]    Report Status 01/13/2021 FINAL  Final  MRSA PCR Screening     Status: None   Collection Time: 01/12/21  6:04 AM   Specimen: Nasal Mucosa; Nasopharyngeal  Result Value Ref Range Status   MRSA by PCR NEGATIVE NEGATIVE Final    Comment:        The GeneXpert MRSA Assay (FDA approved for NASAL specimens only), is one component of a comprehensive MRSA colonization surveillance program. It is not intended to diagnose MRSA infection nor to guide or monitor treatment for MRSA infections. Performed at [facility], [place] [address] [place], [place] [address]          Radiology Studies: CT ABDOMEN PELVIS WO CONTRAST  Result Date: 01/12/2021 CLINICAL DATA:  Dyspnea, fever, sepsis, glomerulonephritis EXAM: CT CHEST, ABDOMEN AND PELVIS WITHOUT CONTRAST TECHNIQUE: Multidetector CT imaging of the chest, abdomen and pelvis was performed following the standard protocol without IV contrast. COMPARISON:  05/26/2020 FINDINGS: CT CHEST FINDINGS Cardiovascular: Mild coronary artery calcification. Global cardiac size within normal limits. No pericardial effusion. The central pulmonary arteries are of normal caliber. Mild atherosclerotic calcification within the thoracic aorta. No aortic aneurysm. Mediastinum/Nodes: The visualized thyroid is unremarkable. No pathologic thoracic adenopathy. Moderate hiatal hernia. The esophagus demonstrates an air-fluid level which may relate to gastroesophageal reflux or esophageal dysmotility. Lungs/Pleura: Mild bibasilar atelectasis. Trace interstitial pulmonary edema with smooth interlobular septal thickening at the lung bases and apices. No confluent pulmonary infiltrate. No pneumothorax or pleural effusion. The central airways are widely patent. Musculoskeletal: No  acute bone abnormality. No suspicious lytic or blastic bone lesion. CT ABDOMEN PELVIS FINDINGS Hepatobiliary: Status post cholecystectomy. Mild pneumobilia suggests prior sphincterotomy. There is, however, hyperdensity noted within the central biliary tree, better appreciated on coronal imaging representing a intraluminal calculus within the central left hepatic bile ducts measuring 10 mm x 18 mm. There is mild left intrahepatic biliary ductal dilation and fluid opacification of these ducts in keeping with an obstructed biliary tree. There is persistent pneumobilia within the a right hepatic ducts indicating patency of this portion of the biliary tree. The liver is otherwise unremarkable. Pancreas: Unremarkable Spleen: Unremarkable Adrenals/Urinary Tract: The adrenal glands are unremarkable. The kidneys are normal in size and position. No intrarenal or ureteral calculi. No hydronephrosis. The bladder is mildly distended, but is otherwise unremarkable. Stomach/Bowel: Extensive descending and sigmoid colonic diverticulosis. No superimposed inflammatory change. The stomach, small bowel, and large bowel are otherwise unremarkable. Appendix absent. No free intraperitoneal fluid or gas. Vascular/Lymphatic: Mild atherosclerotic calcification within the thoracic aorta. No aortic aneurysm. No pathologic adenopathy  within the abdomen and pelvis. Reproductive: Status post hysterectomy. No adnexal masses. Other: Tiny fat containing umbilical hernia.  Rectum unremarkable. Musculoskeletal: No lytic or blastic bone lesion. L4-5 fusion with instrumentation has been performed. No acute bone abnormality. IMPRESSION: Intraluminal calculi within the central left biliary tree with distal fluid opacification and dilation of the intrahepatic bile ducts within the left hepatic lobe. This may manifest clinically as sepsis secondary to ascending cholangitis. Mild coronary artery calcification. Trace interstitial pulmonary edema. Extensive  distal colonic diverticulosis Aortic Atherosclerosis (ICD10-I70.0). Electronically Signed   By: Fidela Salisbury MD   On: 01/12/2021 02:44   CT CHEST WO CONTRAST  Result Date: 01/12/2021 CLINICAL DATA:  Dyspnea, fever, sepsis, glomerulonephritis EXAM: CT CHEST, ABDOMEN AND PELVIS WITHOUT CONTRAST TECHNIQUE: Multidetector CT imaging of the chest, abdomen and pelvis was performed following the standard protocol without IV contrast. COMPARISON:  05/26/2020 FINDINGS: CT CHEST FINDINGS Cardiovascular: Mild coronary artery calcification. Global cardiac size within normal limits. No pericardial effusion. The central pulmonary arteries are of normal caliber. Mild atherosclerotic calcification within the thoracic aorta. No aortic aneurysm. Mediastinum/Nodes: The visualized thyroid is unremarkable. No pathologic thoracic adenopathy. Moderate hiatal hernia. The esophagus demonstrates an air-fluid level which may relate to gastroesophageal reflux or esophageal dysmotility. Lungs/Pleura: Mild bibasilar atelectasis. Trace interstitial pulmonary edema with smooth interlobular septal thickening at the lung bases and apices. No confluent pulmonary infiltrate. No pneumothorax or pleural effusion. The central airways are widely patent. Musculoskeletal: No acute bone abnormality. No suspicious lytic or blastic bone lesion. CT ABDOMEN PELVIS FINDINGS Hepatobiliary: Status post cholecystectomy. Mild pneumobilia suggests prior sphincterotomy. There is, however, hyperdensity noted within the central biliary tree, better appreciated on coronal imaging representing a intraluminal calculus within the central left hepatic bile ducts measuring 10 mm x 18 mm. There is mild left intrahepatic biliary ductal dilation and fluid opacification of these ducts in keeping with an obstructed biliary tree. There is persistent pneumobilia within the a right hepatic ducts indicating patency of this portion of the biliary tree. The liver is otherwise  unremarkable. Pancreas: Unremarkable Spleen: Unremarkable Adrenals/Urinary Tract: The adrenal glands are unremarkable. The kidneys are normal in size and position. No intrarenal or ureteral calculi. No hydronephrosis. The bladder is mildly distended, but is otherwise unremarkable. Stomach/Bowel: Extensive descending and sigmoid colonic diverticulosis. No superimposed inflammatory change. The stomach, small bowel, and large bowel are otherwise unremarkable. Appendix absent. No free intraperitoneal fluid or gas. Vascular/Lymphatic: Mild atherosclerotic calcification within the thoracic aorta. No aortic aneurysm. No pathologic adenopathy within the abdomen and pelvis. Reproductive: Status post hysterectomy. No adnexal masses. Other: Tiny fat containing umbilical hernia.  Rectum unremarkable. Musculoskeletal: No lytic or blastic bone lesion. L4-5 fusion with instrumentation has been performed. No acute bone abnormality. IMPRESSION: Intraluminal calculi within the central left biliary tree with distal fluid opacification and dilation of the intrahepatic bile ducts within the left hepatic lobe. This may manifest clinically as sepsis secondary to ascending cholangitis. Mild coronary artery calcification. Trace interstitial pulmonary edema. Extensive distal colonic diverticulosis Aortic Atherosclerosis (ICD10-I70.0). Electronically Signed   By: Fidela Salisbury MD   On: 01/12/2021 02:44   DG Chest Port 1 View  Result Date: 01/12/2021 CLINICAL DATA:  Vomiting and fever EXAM: PORTABLE CHEST 1 VIEW COMPARISON:  12/25/2017 FINDINGS: The heart size and mediastinal contours are within normal limits. Both lungs are clear. The visualized skeletal structures are unremarkable. IMPRESSION: No active disease. Electronically Signed   By: Ulyses Jarred M.D.   On: 01/12/2021 00:17   DG  ERCP BILIARY & PANCREATIC DUCTS  Result Date: 01/12/2021 CLINICAL DATA:  Choledocholithiasis EXAM: ERCP TECHNIQUE: Multiple spot images obtained with  the fluoroscopic device and submitted for interpretation post-procedure. FLUOROSCOPY TIME:  Fluoroscopy Time:  5 minutes 32 seconds Radiation Exposure Index (if provided by the fluoroscopic device): 124.42 mGy COMPARISON:  CT abdomen/pelvis 01/12/2021 FINDINGS: Five intraoperative saved images are submitted for review. The images demonstrate a flexible endoscope in the descending duodenum with wire cannulation of the right intrahepatic ducts. Cholangiogram demonstrates mild biliary ductal dilatation. Filling defects in the distal common bile duct consistent with choledocholithiasis. Subsequent images demonstrate sphincterotomy and balloon sweep of the common duct. IMPRESSION: 1. Choledocholithiasis. 2. ERCP with sphincterotomy and balloon sweeping of the common duct. These images were submitted for radiologic interpretation only. Please see the procedural report for the amount of contrast and the fluoroscopy time utilized. Electronically Signed   By: Jacqulynn Cadet M.D.   On: 01/12/2021 10:49   ECHOCARDIOGRAM COMPLETE  Result Date: 01/12/2021    ECHOCARDIOGRAM REPORT   Patient Name:   Tina Briggs Date of Exam: 01/12/2021 Medical Rec #:  [id number]           Height:       63.0 in Accession #:    [id number]          Weight:       187.6 lb Date of Birth:  [birth year]          BSA:          1.882 m Patient Age:    71 years            BP:           124/57 mmHg Patient Gender: F                   HR:           56 bpm. Exam Location:  Inpatient Procedure: 2D Echo, Cardiac Doppler and Color Doppler Indications:    Dyspnea R06.00  History:        Patient has no prior history of Echocardiogram examinations.                 Risk Factors:Hypertension, Diabetes, Dyslipidemia and GERD.  Sonographer:    Jonelle Sidle Dance Referring Phys: [id number] [place]  1. Left ventricular ejection fraction, by estimation, is 60 to 65%. The left ventricle has normal function. The left ventricle has no regional wall  motion abnormalities. Left ventricular diastolic parameters are consistent with Grade II diastolic dysfunction (pseudonormalization).  2. Right ventricular systolic function is normal. The right ventricular size is normal. There is normal pulmonary artery systolic pressure.  3. Left atrial size was moderately dilated.  4. The mitral valve is grossly normal. Trivial mitral valve regurgitation. Moderate mitral annular calcification.  5. Tricuspid valve regurgitation is mild to moderate.  6. The aortic valve is tricuspid. There is mild calcification of the aortic valve. There is mild thickening of the aortic valve. Aortic valve regurgitation is not visualized. Mild aortic valve sclerosis is present, with no evidence of aortic valve stenosis.  7. The inferior vena cava is dilated in size with >50% respiratory variability, suggesting right atrial pressure of 8 mmHg. FINDINGS  Left Ventricle: Left ventricular ejection fraction, by estimation, is 60 to 65%. The left ventricle has normal function. The left ventricle has no regional wall motion abnormalities. The left ventricular internal cavity size was normal in size. There is  borderline left ventricular  hypertrophy. Left ventricular diastolic parameters are consistent with Grade II diastolic dysfunction (pseudonormalization). Right Ventricle: The right ventricular size is normal. Right vetricular wall thickness was not well visualized. Right ventricular systolic function is normal. There is normal pulmonary artery systolic pressure. The tricuspid regurgitant velocity is 2.31 m/s, and with an assumed right atrial pressure of 8 mmHg, the estimated right ventricular systolic pressure is 44.3 mmHg. Left Atrium: Left atrial size was moderately dilated. Right Atrium: Right atrial size was normal in size. Pericardium: There is no evidence of pericardial effusion. Mitral Valve: The mitral valve is grossly normal. There is mild thickening of the mitral valve leaflet(s). There is  mild calcification of the mitral valve leaflet(s). Moderate mitral annular calcification. Trivial mitral valve regurgitation. Tricuspid Valve: The tricuspid valve is normal in structure. Tricuspid valve regurgitation is mild to moderate. Aortic Valve: Focal calcification of NCC. The aortic valve is tricuspid. There is mild calcification of the aortic valve. There is mild thickening of the aortic valve. There is mild aortic valve annular calcification. Aortic valve regurgitation is not visualized. Mild aortic valve sclerosis is present, with no evidence of aortic valve stenosis. Pulmonic Valve: The pulmonic valve was grossly normal. Pulmonic valve regurgitation is mild. No evidence of pulmonic stenosis. Aorta: The aortic root, ascending aorta, aortic arch and descending aorta are all structurally normal, with no evidence of dilitation or obstruction. Venous: The inferior vena cava is dilated in size with greater than 50% respiratory variability, suggesting right atrial pressure of 8 mmHg. IAS/Shunts: The atrial septum is grossly normal.  LEFT VENTRICLE PLAX 2D LVIDd:         3.50 cm  Diastology LVIDs:         2.30 cm  LV e' medial:    7.94 cm/s LV PW:         1.20 cm  LV E/e' medial:  16.2 LV IVS:        1.10 cm  LV e' lateral:   8.92 cm/s LVOT diam:     1.90 cm  LV E/e' lateral: 14.5 LV SV:         58 LV SV Index:   31 LVOT Area:     2.84 cm  RIGHT VENTRICLE             IVC RV Basal diam:  2.30 cm     IVC diam: 2.50 cm RV S prime:     10.00 cm/s TAPSE (M-mode): 2.1 cm LEFT ATRIUM             Index       RIGHT ATRIUM           Index LA diam:        4.20 cm 2.23 cm/m  RA Area:     11.00 cm LA Vol (A2C):   83.9 ml 44.58 ml/m RA Volume:   22.80 ml  12.12 ml/m LA Vol (A4C):   64.6 ml 34.33 ml/m LA Biplane Vol: 73.8 ml 39.22 ml/m  AORTIC VALVE LVOT Vmax:   80.40 cm/s LVOT Vmean:  55.000 cm/s LVOT VTI:    0.206 m  AORTA Ao Root diam: 3.10 cm Ao Asc diam:  3.40 cm MITRAL VALVE                TRICUSPID VALVE MV Area  (PHT): 3.91 cm     TR Peak grad:   21.3 mmHg MV Decel Time: 194 msec     TR Vmax:  231.00 cm/s MV E velocity: 129.00 cm/s MV A velocity: 73.10 cm/s   SHUNTS MV E/A ratio:  1.76         Systemic VTI:  0.21 m                             Systemic Diam: 1.90 cm Buford Dresser MD Electronically signed by Buford Dresser MD Signature Date/Time: 01/12/2021/7:03:04 PM    Final         Scheduled Meds: . Chlorhexidine Gluconate Cloth  6 each Topical Daily  . heparin  5,000 Units Subcutaneous Q8H  . hydrALAZINE  10 mg Oral Q6H  . insulin aspart  0-9 Units Subcutaneous Q4H  . mouth rinse  15 mL Mouth Rinse BID  . pantoprazole (PROTONIX) IV  40 mg Intravenous Q24H  . sodium chloride flush  10 mL Intravenous Q12H   Continuous Infusions: . sodium chloride 20 mL/hr at 01/12/21 2000  . dextrose    . meropenem (MERREM) IV Stopped (01/13/21 0400)  . norepinephrine (LEVOPHED) Adult infusion Stopped (01/12/21 1039)     LOS: 1 day    Time spent: 43 min    Nicolette Bang, MD [facility]  If 7PM-7AM, please contact night-coverage  01/13/2021, 10:56 AM

## 2021-01-13 NOTE — Progress Notes (Signed)
Tina Briggs 11:09 AM  Subjective: Patient doing better than yesterday but still with some abdominal pain but no nausea or vomiting and is moving her bowels and her case discussed with her husband as well and she has no new complaints  Objective: Vital signs stable afebrile no acute distress abdomen is pertinent for some right upper quadrant mild discomfort nontender elsewhere White count still 16 LFTs decreased  Assessment: Status post ERCP and stone extraction without increasing sphincterotomy  Plan: We will check on tomorrow follow labs when better may slowly advance diet  [facility]  office [phone] After 5PM or if no answer call [phone]

## 2021-01-13 NOTE — Progress Notes (Signed)
01/13/2021 Per central telemetry, patient had 2.9 second sinus pause. Continues bradycardic in low 40s, same as previous, noted BP trending higher.Patient sleeping, in no acute distress. Update sent to  [facility] APP for [facility] via [place] page. Will continue to monitor. Cindy S. Brigitte Pulse BSN, RN, [place] 01/13/2021 2:50 AM

## 2021-01-13 NOTE — Consult Note (Signed)
Referring Physician: C. Spongberg/Husain Karrar  LILLIANN ROSSETTI is an 75 y.o. female.                       Chief Complaint: Bradycardia  HPI: 75 years old white female with PMH of hypertension, CKD IIIb, type 2 DM had recurrent choledocholithiasis and remote cholecystectomy. She had ERCP in 2016 and yesterday with removal of stones.  Post procedure she has persistent sinus bradycardia without evidence of ischemia on EKG today or dizziness at rest. Her echocardiogram shows normal LV systolic function. Her liver enzymes are returning to normal.  Post ERCP procedure she has bilateral shoulder and neck area discomfort and mild generalized abdominal discomfort. She has no known coronary artery disease in past.  Past Medical History:  Diagnosis Date  . Anemia   . Aspiration pneumonia ([place])    after common bile duct obstruction  . CATARACT, RIGHT EYE    had repaired  . Depression   . Diverticulitis    recurrent episodes  . DM   . GERD   . 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 Mild to mod TI scarring. (Dr. Lorrene Reid)  . HNP (herniated nucleus pulposus), lumbar    Recurrent  . HYPERLIPIDEMIA-MIXED   . HYPERTENSION, UNSPECIFIED   . OBESITY   . PONV (postoperative nausea and vomiting)    past history only, "sometimes they put a patch on me"      Past Surgical History:  Procedure Laterality Date  . ABDOMINAL HYSTERECTOMY    . APPENDECTOMY    . BACK SURGERY     lumbar '09"microdiscectomy"  . CHOLECYSTECTOMY     open  . COLONOSCOPY WITH PROPOFOL N/A 11/22/2014   Procedure: COLONOSCOPY WITH PROPOFOL;  Surgeon: Garlan Fair, MD;  [facility]: [facility];  Service: Endoscopy;  Laterality: N/A;  . ERCP N/A 05/26/2015   Procedure: ENDOSCOPIC RETROGRADE CHOLANGIOPANCREATOGRAPHY (ERCP)   (DOING CASE IN MAIN OR);  Surgeon: Teena Irani, MD;  [facility];  Service: Gastroenterology;  Laterality: N/A;  . ERCP N/A 06/06/2015    Procedure: ENDOSCOPIC RETROGRADE CHOLANGIOPANCREATOGRAPHY (ERCP);  Surgeon: Clarene Essex, MD;  Location: [facility];  Service: Endoscopy;  Laterality: N/A;  . ESOPHAGOGASTRODUODENOSCOPY (EGD) WITH PROPOFOL N/A 11/22/2014   Procedure: ESOPHAGOGASTRODUODENOSCOPY (EGD) WITH PROPOFOL;  Surgeon: Garlan Fair, MD;  [facility];  Service: Endoscopy;  Laterality: N/A;  . EYE SURGERY Bilateral    cataracts  . LUMBAR FUSION  09/16/2018  . LUMBAR LAMINECTOMY/DECOMPRESSION MICRODISCECTOMY Left 12/31/2017   Procedure: Laminectomy and Foraminotomy - Lumbar Three-Four - left Extraforaminal diskectomy Lumbar Four-Five left;  Surgeon: Eustace Moore, MD;  Location: [facility];  Service: Neurosurgery;  Laterality: Left;  . PERONEAL NERVE DECOMPRESSION Left 12/31/2017   Procedure: Left PERONEAL NERVE DECOMPRESSION;  Surgeon: Eustace Moore, MD;  Location: [facility];  Service: Neurosurgery;  Laterality: Left;  left  . TUBAL LIGATION      Family History  Problem Relation Age of Onset  . Cancer Maternal Aunt        breast cancer  . Cancer Maternal Grandmother        kidney cancer   . Cancer Paternal Grandmother        bone cancer   . Heart disease Mother   . Alzheimer's disease Father   . Tremor Father        possible PD  . Diabetes Mellitus II Brother   . Healthy Son   . Healthy Son  Social History:  reports that she has never smoked. She has never used smokeless tobacco. She reports that she does not drink alcohol and does not use drugs.  Allergies:  Allergies  Allergen Reactions  . Codeine Shortness Of Breath  . Amoxicillin Other (See Comments)  . Clavulanic Acid   . Flagyl [Metronidazole]   . Other Other (See Comments)  . Prevacid [Lansoprazole]   . Tetracyclines & Related Itching  . Trulicity [Dulaglutide]   . Azithromycin Itching and Rash  . Erythromycin Itching and Rash  . Morphine And Related Itching and Rash  . Penicillins Itching and Rash    Has patient had a PCN reaction causing  immediate rash, facial/tongue/throat swelling, SOB or lightheadedness with hypotension: Yes Has patient had a PCN reaction causing severe rash involving mucus membranes or skin necrosis: No Has patient had a PCN reaction that required hospitalization: No Has patient had a PCN reaction occurring within the last 10 years: No If all of the above answers are "NO", then may proceed with Cephalosporin use.   [name] [Sulfamethoxazole-Trimethoprim] Itching and Rash    Medications Prior to Admission  Medication Sig Dispense Refill  . acetaminophen (TYLENOL) 500 MG tablet Take 1,000 mg by mouth 2 (two) times daily as needed for moderate pain or headache.     Marland Kitchen aspirin EC 81 MG tablet Take 81 mg by mouth daily.    . cholecalciferol (VITAMIN D) 1000 units tablet Take 3,000 Units by mouth daily.    . cholestyramine light (PREVALITE) 4 g packet Take 4 g by mouth every other day.    . CRESTOR 40 MG tablet TAKE 1 TABLET AT BEDTIME (Patient taking differently: Take 40 mg by mouth at bedtime.) 90 tablet 1  . famotidine (PEPCID) 40 MG tablet Take 40 mg by mouth 2 (two) times daily as needed for heartburn or indigestion.    . fluticasone (FLONASE) 50 MCG/ACT nasal spray Place 2 sprays into both nostrils daily as needed for allergies.     Marland Kitchen gabapentin (NEURONTIN) 300 MG capsule Take 300-600 mg by mouth See admin instructions. 300mg  in the morning 600mg  at night    . ibuprofen (ADVIL) 200 MG tablet Take 400 mg by mouth every 6 (six) hours as needed for fever, headache or mild pain.    Marland Kitchen JARDIANCE 25 MG TABS tablet Take 25 mg by mouth daily.    Marland Kitchen losartan (COZAAR) 25 MG tablet Take 25 mg by mouth 2 (two) times daily.    . Multiple Vitamins-Minerals (PRESERVISION AREDS 2 PO) Take 1 tablet by mouth 2 (two) times daily.    Marland Kitchen NOVOLOG FLEXPEN RELION 100 UNIT/ML FlexPen Inject 14-16 Units into the skin 3 (three) times daily as needed. BS    . venlafaxine XR (EFFEXOR-XR) 75 MG 24 hr capsule Take 75 mg by mouth every  morning.     . vitamin B-12 (CYANOCOBALAMIN) 1000 MCG tablet Take 1,000 mcg by mouth 3 (three) times a week. Take on Sundays, Wednesdays and Fridays.      Results for orders placed or performed during the hospital encounter of 01/11/21 (from the past 48 hour(s))  Resp Panel by RT-PCR (Flu A&B, Covid) Nasopharyngeal Swab     Status: None   Collection Time: 01/11/21 11:36 PM   Specimen: Nasopharyngeal Swab; Nasopharyngeal(NP) swabs in vial transport medium  Result Value Ref Range   SARS Coronavirus 2 by RT PCR NEGATIVE NEGATIVE    Comment: (NOTE) SARS-CoV-2 target nucleic acids are NOT DETECTED.  The SARS-CoV-2  RNA is generally detectable in upper respiratory specimens during the acute phase of infection. The lowest concentration of SARS-CoV-2 viral copies this assay can detect is 138 copies/mL. A negative result does not preclude SARS-Cov-2 infection and should not be used as the sole basis for treatment or other patient management decisions. A negative result may occur with  improper specimen collection/handling, submission of specimen other than nasopharyngeal swab, presence of viral mutation(s) within the areas targeted by this assay, and inadequate number of viral copies(<138 copies/mL). A negative result must be combined with clinical observations, patient history, and epidemiological information. The expected result is Negative.  Fact Sheet for Patients:  EntrepreneurPulse.com.au  Fact Sheet for Healthcare Providers:  IncredibleEmployment.be  This test is no t yet approved or cleared by the [place] FDA and  has been authorized for detection and/or diagnosis of SARS-CoV-2 by FDA under an Emergency Use Authorization (EUA). This EUA will remain  in effect (meaning this test can be used) for the duration of the COVID-19 declaration under Section 564(b)(1) of the Act, 21 U.S.C.section 360bbb-3(b)(1), unless the authorization is terminated   or revoked sooner.       Influenza A by PCR NEGATIVE NEGATIVE   Influenza B by PCR NEGATIVE NEGATIVE    Comment: (NOTE) The Xpert Xpress SARS-CoV-2/FLU/RSV plus assay is intended as an aid in the diagnosis of influenza from Nasopharyngeal swab specimens and should not be used as a sole basis for treatment. Nasal washings and aspirates are unacceptable for Xpert Xpress SARS-CoV-2/FLU/RSV testing.  Fact Sheet for Patients: EntrepreneurPulse.com.au  Fact Sheet for Healthcare Providers: IncredibleEmployment.be  This test is not yet approved or cleared by the [place] FDA and has been authorized for detection and/or diagnosis of SARS-CoV-2 by FDA under an Emergency Use Authorization (EUA). This EUA will remain in effect (meaning this test can be used) for the duration of the COVID-19 declaration under Section 564(b)(1) of the Act, 21 U.S.C. section 360bbb-3(b)(1), unless the authorization is terminated or revoked.  Performed at [facility], [place] [address] [place], [place] [address]   Lactic acid, plasma     Status: Abnormal   Collection Time: 01/11/21 11:36 PM  Result Value Ref Range   Lactic Acid, Venous 2.6 (HH) 0.5 - 1.9 mmol/L    Comment: CRITICAL RESULT CALLED TO, READ BACK BY AND VERIFIED WITH: KERCHER,Q 01/12/21 @0145  BY SEEL,M Performed at [facility], [place] [address], [place], [place] [address]   Comprehensive metabolic panel     Status: Abnormal   Collection Time: 01/11/21 11:36 PM  Result Value Ref Range   Sodium 141 135 - 145 mmol/L   Potassium 4.1 3.5 - 5.1 mmol/L   Chloride 113 (H) 98 - 111 mmol/L   CO2 20 (L) 22 - 32 mmol/L   Glucose, Bld 154 (H) 70 - 99 mg/dL    Comment: Glucose reference range applies only to samples taken after fasting for at least 8 hours.   BUN 33 (H) 8 - 23 mg/dL   Creatinine, Ser 1.70 (H) 0.44 - 1.00 mg/dL   Calcium 8.3 (L) 8.9 - 10.3 mg/dL   Total  Protein 5.8 (L) 6.5 - 8.1 g/dL   Albumin 3.1 (L) 3.5 - 5.0 g/dL   AST 330 (H) 15 - 41 U/L    Comment: RESULTS CONFIRMED BY MANUAL DILUTION   ALT 425 (H) 0 - 44 U/L   Alkaline Phosphatase 102 38 - 126 U/L   Total Bilirubin 1.3 (H) 0.3 -  1.2 mg/dL   GFR, Estimated 31 (L) >60 mL/min    Comment: (NOTE) Calculated using the CKD-EPI Creatinine Equation (2021)    Anion gap 8 5 - 15    Comment: Performed at [facility], [place] [address] [place], [place] [address]  CBC WITH DIFFERENTIAL     Status: Abnormal   Collection Time: 01/11/21 11:36 PM  Result Value Ref Range   WBC 9.5 4.0 - 10.5 K/uL   RBC 4.09 3.87 - 5.11 MIL/uL   Hemoglobin 12.2 12.0 - 15.0 g/dL   HCT 37.5 36.0 - 46.0 %   MCV 91.7 80.0 - 100.0 fL   MCH 29.8 26.0 - 34.0 pg   MCHC 32.5 30.0 - 36.0 g/dL   RDW 13.5 11.5 - 15.5 %   Platelets 110 (L) 150 - 400 K/uL    Comment: SPECIMEN CHECKED FOR CLOTS Immature Platelet Fraction may be clinically indicated, consider ordering this additional test [id number] REPEATED TO VERIFY PLATELET COUNT CONFIRMED BY SMEAR    nRBC 0.0 0.0 - 0.2 %   Neutrophils Relative % 91 %   Neutro Abs 8.7 (H) 1.7 - 7.7 K/uL   Lymphocytes Relative 3 %   Lymphs Abs 0.3 (L) 0.7 - 4.0 K/uL   Monocytes Relative 5 %   Monocytes Absolute 0.4 0.1 - 1.0 K/uL   Eosinophils Relative 0 %   Eosinophils Absolute 0.0 0.0 - 0.5 K/uL   Basophils Relative 0 %   Basophils Absolute 0.0 0.0 - 0.1 K/uL   WBC Morphology MILD LEFT SHIFT (1-5% METAS, OCC MYELO, OCC BANDS)     Comment: VACUOLATED NEUTROPHILS RARE BANDS PRESENT    Immature Granulocytes 1 %   Abs Immature Granulocytes 0.09 (H) 0.00 - 0.07 K/uL    Comment: Performed at [facility], [place] [address], [place], [place] [address]  Protime-INR     Status: Abnormal   Collection Time: 01/11/21 11:36 PM  Result Value Ref Range   Prothrombin Time 15.4 (H) 11.4 - 15.2 seconds   INR 1.2 0.8 - 1.2    Comment: (NOTE) INR goal  varies based on device and disease states. Performed at [facility], [place] [address], [place], [place] [address]   APTT     Status: None   Collection Time: 01/11/21 11:36 PM  Result Value Ref Range   aPTT 27 24 - 36 seconds    Comment: Performed at [facility], [place] [address] [place], [place] [address]  Blood Culture (routine x 2)     Status: None (Preliminary result)   Collection Time: 01/11/21 11:36 PM   Specimen: BLOOD  Result Value Ref Range   Specimen Description      BLOOD BLOOD LEFT HAND Performed at [place] [address] [place], [place] [address]    Special Requests      BOTTLES DRAWN AEROBIC ONLY Blood Culture results may not be optimal due to an inadequate volume of blood received in culture bottles Performed at [place] [address], [place], [place] [address]    Culture  Setup Time      GRAM NEGATIVE RODS AEROBIC BOTTLE ONLY CRITICAL VALUE NOTED.  VALUE IS CONSISTENT WITH PREVIOUSLY REPORTED AND CALLED VALUE. Performed at [facility], [place] [address] [place], [place] [address]    Culture GRAM NEGATIVE RODS    Report Status PENDING   Lipase, blood     Status: None   Collection Time:  01/11/21 11:36 PM  Result Value Ref Range   Lipase 39 11 - 51 U/L    Comment: Performed at [facility], [place] [address] [place], [place] [address]  Blood Culture (routine x 2)     Status: None (Preliminary result)   Collection Time: 01/11/21 11:41 PM   Specimen: BLOOD  Result Value Ref Range   Specimen Description      BLOOD LEFT ANTECUBITAL Performed at [facility], [place] [address] [place], [place] [address]    Special Requests      BOTTLES DRAWN AEROBIC AND ANAEROBIC Blood Culture adequate volume Performed at [place] [address], [place], [place] [address]    Culture  Setup Time      GRAM NEGATIVE RODS IN BOTH  AEROBIC AND ANAEROBIC BOTTLES CRITICAL RESULT CALLED TO, READ BACK BY AND VERIFIED WITH: TERRY GREEN PHARMD @1712  01/12/21 EB Performed at [facility], [place] [address], [place], [place] [address]    Culture GRAM NEGATIVE RODS    Report Status PENDING   Blood Culture ID Panel (Reflexed)     Status: Abnormal   Collection Time: 01/11/21 11:41 PM  Result Value Ref Range   Enterococcus faecalis NOT DETECTED NOT DETECTED   Enterococcus Faecium NOT DETECTED NOT DETECTED   Listeria monocytogenes NOT DETECTED NOT DETECTED   Staphylococcus species NOT DETECTED NOT DETECTED   Staphylococcus aureus (BCID) NOT DETECTED NOT DETECTED   Staphylococcus epidermidis NOT DETECTED NOT DETECTED   Staphylococcus lugdunensis NOT DETECTED NOT DETECTED   Streptococcus species NOT DETECTED NOT DETECTED   Streptococcus agalactiae NOT DETECTED NOT DETECTED   Streptococcus pneumoniae NOT DETECTED NOT DETECTED   Streptococcus pyogenes NOT DETECTED NOT DETECTED   A.calcoaceticus-baumannii NOT DETECTED NOT DETECTED   Bacteroides fragilis NOT DETECTED NOT DETECTED   Enterobacterales DETECTED (A) NOT DETECTED    Comment: CRITICAL RESULT CALLED TO, READ BACK BY AND VERIFIED WITH: TERRY GREEN PHARMD @1712  01/12/21 EB    Enterobacter cloacae complex NOT DETECTED NOT DETECTED   Escherichia coli DETECTED (A) NOT DETECTED    Comment: CRITICAL RESULT CALLED TO, READ BACK BY AND VERIFIED WITH: TERRY GREEN PHARMD @1712  01/12/21 EB    Klebsiella aerogenes NOT DETECTED NOT DETECTED   Klebsiella oxytoca DETECTED (A) NOT DETECTED    Comment: CRITICAL RESULT CALLED TO, READ BACK BY AND VERIFIED WITH: TERRY GREEN PHARMD @1712  01/12/21 EB    Klebsiella pneumoniae NOT DETECTED NOT DETECTED   Proteus species NOT DETECTED NOT DETECTED   Salmonella species NOT DETECTED NOT DETECTED   Serratia marcescens NOT DETECTED NOT DETECTED   Haemophilus influenzae NOT DETECTED NOT DETECTED   Neisseria meningitidis NOT DETECTED NOT  DETECTED   Pseudomonas aeruginosa NOT DETECTED NOT DETECTED   Stenotrophomonas maltophilia NOT DETECTED NOT DETECTED   Candida albicans NOT DETECTED NOT DETECTED   Candida auris NOT DETECTED NOT DETECTED   Candida glabrata NOT DETECTED NOT DETECTED   Candida krusei NOT DETECTED NOT DETECTED   Candida parapsilosis NOT DETECTED NOT DETECTED   Candida tropicalis NOT DETECTED NOT DETECTED   Cryptococcus neoformans/gattii NOT DETECTED NOT DETECTED   CTX-M ESBL NOT DETECTED NOT DETECTED   Carbapenem resistance IMP NOT DETECTED NOT DETECTED   Carbapenem resistance KPC NOT DETECTED NOT DETECTED   Carbapenem resistance NDM NOT DETECTED NOT DETECTED   Carbapenem resist OXA 48 LIKE NOT DETECTED NOT DETECTED   Carbapenem resistance VIM NOT DETECTED NOT DETECTED    Comment: Performed at [facility], [place] [address], [place], [place] [address]  Lactic acid, plasma     Status: None   Collection Time: 01/12/21  1:36 AM  Result Value Ref Range   Lactic Acid, Venous 1.0 0.5 - 1.9 mmol/L    Comment: Performed at [facility], [place] [address] [place], [place] [address]  CBG monitoring, ED     Status: Abnormal   Collection Time: 01/12/21  3:02 AM  Result Value Ref Range   Glucose-Capillary 136 (H) 70 - 99 mg/dL    Comment: Glucose reference range applies only to samples taken after fasting for at least 8 hours.  Urinalysis, Routine w reflex microscopic Urine, Catheterized     Status: Abnormal   Collection Time: 01/12/21  4:41 AM  Result Value Ref Range   Color, Urine YELLOW YELLOW   APPearance HAZY (A) CLEAR   Specific Gravity, Urine 1.020 1.005 - 1.030   pH 5.0 5.0 - 8.0   Glucose, UA >=500 (A) NEGATIVE mg/dL   Hgb urine dipstick SMALL (A) NEGATIVE   Bilirubin Urine NEGATIVE NEGATIVE   Ketones, ur NEGATIVE NEGATIVE mg/dL   Protein, ur >=300 (A) NEGATIVE mg/dL   Nitrite NEGATIVE NEGATIVE   Leukocytes,Ua NEGATIVE NEGATIVE   RBC / HPF 0-5 0 - 5 RBC/hpf   WBC, UA 0-5 0 - 5  WBC/hpf   Bacteria, UA RARE (A) NONE SEEN   Squamous Epithelial / LPF 0-5 0 - 5   Mucus PRESENT     Comment: Performed at [facility], [place] [address], [place], [place] [address]  MRSA PCR Screening     Status: None   Collection Time: 01/12/21  6:04 AM   Specimen: Nasal Mucosa; Nasopharyngeal  Result Value Ref Range   MRSA by PCR NEGATIVE NEGATIVE    Comment:        The GeneXpert MRSA Assay (FDA approved for NASAL specimens only), is one component of a comprehensive MRSA colonization surveillance program. It is not intended to diagnose MRSA infection nor to guide or monitor treatment for MRSA infections. Performed at [facility], [place] [address] [place], [place] [address]   Comprehensive metabolic panel     Status: Abnormal   Collection Time: 01/12/21  7:42 AM  Result Value Ref Range   Sodium 143 135 - 145 mmol/L   Potassium 3.4 (L) 3.5 - 5.1 mmol/L    Comment: DELTA CHECK NOTED   Chloride 116 (H) 98 - 111 mmol/L   CO2 17 (L) 22 - 32 mmol/L   Glucose, Bld 144 (H) 70 - 99 mg/dL    Comment: Glucose reference range applies only to samples taken after fasting for at least 8 hours.   BUN 37 (H) 8 - 23 mg/dL   Creatinine, Ser 1.68 (H) 0.44 - 1.00 mg/dL   Calcium 8.0 (L) 8.9 - 10.3 mg/dL   Total Protein 5.3 (L) 6.5 - 8.1 g/dL   Albumin 2.9 (L) 3.5 - 5.0 g/dL   AST 176 (H) 15 - 41 U/L   ALT 272 (H) 0 - 44 U/L   Alkaline Phosphatase 81 38 - 126 U/L   Total Bilirubin 0.9 0.3 - 1.2 mg/dL   GFR, Estimated 32 (L) >60 mL/min    Comment: (NOTE) Calculated using the CKD-EPI Creatinine Equation (2021)    Anion gap 10 5 - 15    Comment: Performed at [facility], [place] [address], [place], [place] [address]  CBC WITH DIFFERENTIAL  Status: Abnormal   Collection Time: 01/12/21  7:42 AM  Result Value Ref Range   WBC 16.3 (H) 4.0 - 10.5 K/uL   RBC 3.40 (L) 3.87 - 5.11 MIL/uL   Hemoglobin 10.4 (L) 12.0 - 15.0 g/dL   HCT 31.3 (L)  36.0 - 46.0 %   MCV 92.1 80.0 - 100.0 fL   MCH 30.6 26.0 - 34.0 pg   MCHC 33.2 30.0 - 36.0 g/dL   RDW 13.9 11.5 - 15.5 %   Platelets 98 (L) 150 - 400 K/uL    Comment: SPECIMEN CHECKED FOR CLOTS Immature Platelet Fraction may be clinically indicated, consider ordering this additional test [id number] REPEATED TO VERIFY    nRBC 0.0 0.0 - 0.2 %   Neutrophils Relative % 85 %   Neutro Abs 13.9 (H) 1.7 - 7.7 K/uL   Lymphocytes Relative 4 %   Lymphs Abs 0.7 0.7 - 4.0 K/uL   Monocytes Relative 6 %   Monocytes Absolute 0.9 0.1 - 1.0 K/uL   Eosinophils Relative 1 %   Eosinophils Absolute 0.1 0.0 - 0.5 K/uL   Basophils Relative 0 %   Basophils Absolute 0.1 0.0 - 0.1 K/uL   Immature Granulocytes 4 %   Abs Immature Granulocytes 0.68 (H) 0.00 - 0.07 K/uL    Comment: Performed at [facility], [place] [address], [place], [place] [address]  Cortisol     Status: None   Collection Time: 01/12/21  7:42 AM  Result Value Ref Range   Cortisol, Plasma 17.6 ug/dL    Comment: (NOTE) AM    6.7 - 22.6 ug/dL PM   <10.0       ug/dL Performed at [place] [address], [place], [place] [address]   Procalcitonin - Baseline     Status: None   Collection Time: 01/12/21  7:42 AM  Result Value Ref Range   Procalcitonin 53.72 ng/mL    Comment:        Interpretation: PCT >= 10 ng/mL: Important systemic inflammatory response, almost exclusively due to severe bacterial sepsis or septic shock. (NOTE)       Sepsis PCT Algorithm           Lower Respiratory Tract                                      Infection PCT Algorithm    ----------------------------     ----------------------------         PCT < 0.25 ng/mL                PCT < 0.10 ng/mL          Strongly encourage             Strongly discourage   discontinuation of antibiotics    initiation of antibiotics    ----------------------------     -----------------------------       PCT 0.25 - 0.50 ng/mL            PCT 0.10 -  0.25 ng/mL               OR       >80% decrease in PCT            Discourage initiation of  antibiotics      Encourage discontinuation           of antibiotics    ----------------------------     -----------------------------         PCT >= 0.50 ng/mL              PCT 0.26 - 0.50 ng/mL                AND       <80% decrease in PCT             Encourage initiation of                                             antibiotics       Encourage continuation           of antibiotics    ----------------------------     -----------------------------        PCT >= 0.50 ng/mL                  PCT > 0.50 ng/mL               AND         increase in PCT                  Strongly encourage                                      initiation of antibiotics    Strongly encourage escalation           of antibiotics                                     -----------------------------                                           PCT <= 0.25 ng/mL                                                 OR                                        > 80% decrease in PCT                                      Discontinue / Do not initiate                                             antibiotics  Performed at [place] [address], [place], [place] [address]   Glucose, capillary     Status: Abnormal   Collection Time:  01/12/21  8:29 AM  Result Value Ref Range   Glucose-Capillary 137 (H) 70 - 99 mg/dL    Comment: Glucose reference range applies only to samples taken after fasting for at least 8 hours.  Glucose, capillary     Status: Abnormal   Collection Time: 01/12/21 12:39 PM  Result Value Ref Range   Glucose-Capillary 157 (H) 70 - 99 mg/dL    Comment: Glucose reference range applies only to samples taken after fasting for at least 8 hours.  Glucose, capillary     Status: Abnormal   Collection Time: 01/12/21  4:35 PM  Result Value Ref Range    Glucose-Capillary 180 (H) 70 - 99 mg/dL    Comment: Glucose reference range applies only to samples taken after fasting for at least 8 hours.  Glucose, capillary     Status: Abnormal   Collection Time: 01/12/21  8:04 PM  Result Value Ref Range   Glucose-Capillary 191 (H) 70 - 99 mg/dL    Comment: Glucose reference range applies only to samples taken after fasting for at least 8 hours.   Comment 1 Notify RN    Comment 2 Document in Chart   Glucose, capillary     Status: Abnormal   Collection Time: 01/13/21 12:05 AM  Result Value Ref Range   Glucose-Capillary 159 (H) 70 - 99 mg/dL    Comment: Glucose reference range applies only to samples taken after fasting for at least 8 hours.   Comment 1 Notify RN    Comment 2 Document in Chart   Procalcitonin     Status: None   Collection Time: 01/13/21  2:54 AM  Result Value Ref Range   Procalcitonin 47.20 ng/mL    Comment:        Interpretation: PCT >= 10 ng/mL: Important systemic inflammatory response, almost exclusively due to severe bacterial sepsis or septic shock. (NOTE)       Sepsis PCT Algorithm           Lower Respiratory Tract                                      Infection PCT Algorithm    ----------------------------     ----------------------------         PCT < 0.25 ng/mL                PCT < 0.10 ng/mL          Strongly encourage             Strongly discourage   discontinuation of antibiotics    initiation of antibiotics    ----------------------------     -----------------------------       PCT 0.25 - 0.50 ng/mL            PCT 0.10 - 0.25 ng/mL               OR       >80% decrease in PCT            Discourage initiation of                                            antibiotics      Encourage discontinuation           of antibiotics    ----------------------------     -----------------------------  PCT >= 0.50 ng/mL              PCT 0.26 - 0.50 ng/mL                AND       <80% decrease in PCT              Encourage initiation of                                             antibiotics       Encourage continuation           of antibiotics    ----------------------------     -----------------------------        PCT >= 0.50 ng/mL                  PCT > 0.50 ng/mL               AND         increase in PCT                  Strongly encourage                                      initiation of antibiotics    Strongly encourage escalation           of antibiotics                                     -----------------------------                                           PCT <= 0.25 ng/mL                                                 OR                                        > 80% decrease in PCT                                      Discontinue / Do not initiate                                             antibiotics  Performed at [facility] [address], [place], [place] [address]   Glucose, capillary     Status: Abnormal   Collection Time: 01/13/21  3:20 AM  Result Value Ref Range   Glucose-Capillary 143 (H) 70 - 99 mg/dL    Comment: Glucose reference range applies only to samples taken after fasting for at least 8  hours.   Comment 1 Notify RN    Comment 2 Document in Chart   Comprehensive metabolic panel     Status: Abnormal   Collection Time: 01/13/21  5:48 AM  Result Value Ref Range   Sodium 140 135 - 145 mmol/L   Potassium 4.5 3.5 - 5.1 mmol/L    Comment: DELTA CHECK NOTED   Chloride 112 (H) 98 - 111 mmol/L   CO2 18 (L) 22 - 32 mmol/L   Glucose, Bld 143 (H) 70 - 99 mg/dL    Comment: Glucose reference range applies only to samples taken after fasting for at least 8 hours.   BUN 46 (H) 8 - 23 mg/dL   Creatinine, Ser 1.99 (H) 0.44 - 1.00 mg/dL   Calcium 7.8 (L) 8.9 - 10.3 mg/dL   Total Protein 5.5 (L) 6.5 - 8.1 g/dL   Albumin 3.1 (L) 3.5 - 5.0 g/dL   AST 94 (H) 15 - 41 U/L   ALT 194 (H) 0 - 44 U/L   Alkaline Phosphatase 76 38 - 126 U/L   Total Bilirubin  0.8 0.3 - 1.2 mg/dL   GFR, Estimated 26 (L) >60 mL/min    Comment: (NOTE) Calculated using the CKD-EPI Creatinine Equation (2021)    Anion gap 10 5 - 15    Comment: Performed at [facility], [place] [address], [place], [place] [address]  CBC with Differential/Platelet     Status: Abnormal   Collection Time: 01/13/21  5:48 AM  Result Value Ref Range   WBC 16.5 (H) 4.0 - 10.5 K/uL   RBC 3.53 (L) 3.87 - 5.11 MIL/uL   Hemoglobin 10.6 (L) 12.0 - 15.0 g/dL   HCT 32.8 (L) 36.0 - 46.0 %   MCV 92.9 80.0 - 100.0 fL   MCH 30.0 26.0 - 34.0 pg   MCHC 32.3 30.0 - 36.0 g/dL   RDW 14.6 11.5 - 15.5 %   Platelets 93 (L) 150 - 400 K/uL    Comment: Immature Platelet Fraction may be clinically indicated, consider ordering this additional test [id number]    nRBC 0.0 0.0 - 0.2 %   Neutrophils Relative % 86 %   Neutro Abs 14.3 (H) 1.7 - 7.7 K/uL   Lymphocytes Relative 7 %   Lymphs Abs 1.1 0.7 - 4.0 K/uL   Monocytes Relative 4 %   Monocytes Absolute 0.6 0.1 - 1.0 K/uL   Eosinophils Relative 0 %   Eosinophils Absolute 0.0 0.0 - 0.5 K/uL   Basophils Relative 0 %   Basophils Absolute 0.0 0.0 - 0.1 K/uL   WBC Morphology VACUOLATED NEUTROPHILS    Immature Granulocytes 3 %   Abs Immature Granulocytes 0.50 (H) 0.00 - 0.07 K/uL   Burr Cells PRESENT    Polychromasia PRESENT    Ovalocytes PRESENT     Comment: Performed at [facility], [place] [address] [place], [place] [address]  Glucose, capillary     Status: Abnormal   Collection Time: 01/13/21  8:14 AM  Result Value Ref Range   Glucose-Capillary 143 (H) 70 - 99 mg/dL    Comment: Glucose reference range applies only to samples taken after fasting for at least 8 hours.   Comment 1 Notify RN    Comment 2 Document in Chart    CT ABDOMEN PELVIS WO CONTRAST  Result Date: 01/12/2021 CLINICAL DATA:  Dyspnea, fever, sepsis, glomerulonephritis EXAM: CT CHEST, ABDOMEN AND PELVIS WITHOUT CONTRAST TECHNIQUE: Multidetector CT  imaging of the chest, abdomen and  pelvis was performed following the standard protocol without IV contrast. COMPARISON:  05/26/2020 FINDINGS: CT CHEST FINDINGS Cardiovascular: Mild coronary artery calcification. Global cardiac size within normal limits. No pericardial effusion. The central pulmonary arteries are of normal caliber. Mild atherosclerotic calcification within the thoracic aorta. No aortic aneurysm. Mediastinum/Nodes: The visualized thyroid is unremarkable. No pathologic thoracic adenopathy. Moderate hiatal hernia. The esophagus demonstrates an air-fluid level which may relate to gastroesophageal reflux or esophageal dysmotility. Lungs/Pleura: Mild bibasilar atelectasis. Trace interstitial pulmonary edema with smooth interlobular septal thickening at the lung bases and apices. No confluent pulmonary infiltrate. No pneumothorax or pleural effusion. The central airways are widely patent. Musculoskeletal: No acute bone abnormality. No suspicious lytic or blastic bone lesion. CT ABDOMEN PELVIS FINDINGS Hepatobiliary: Status post cholecystectomy. Mild pneumobilia suggests prior sphincterotomy. There is, however, hyperdensity noted within the central biliary tree, better appreciated on coronal imaging representing a intraluminal calculus within the central left hepatic bile ducts measuring 10 mm x 18 mm. There is mild left intrahepatic biliary ductal dilation and fluid opacification of these ducts in keeping with an obstructed biliary tree. There is persistent pneumobilia within the a right hepatic ducts indicating patency of this portion of the biliary tree. The liver is otherwise unremarkable. Pancreas: Unremarkable Spleen: Unremarkable Adrenals/Urinary Tract: The adrenal glands are unremarkable. The kidneys are normal in size and position. No intrarenal or ureteral calculi. No hydronephrosis. The bladder is mildly distended, but is otherwise unremarkable. Stomach/Bowel: Extensive descending and sigmoid  colonic diverticulosis. No superimposed inflammatory change. The stomach, small bowel, and large bowel are otherwise unremarkable. Appendix absent. No free intraperitoneal fluid or gas. Vascular/Lymphatic: Mild atherosclerotic calcification within the thoracic aorta. No aortic aneurysm. No pathologic adenopathy within the abdomen and pelvis. Reproductive: Status post hysterectomy. No adnexal masses. Other: Tiny fat containing umbilical hernia.  Rectum unremarkable. Musculoskeletal: No lytic or blastic bone lesion. L4-5 fusion with instrumentation has been performed. No acute bone abnormality. IMPRESSION: Intraluminal calculi within the central left biliary tree with distal fluid opacification and dilation of the intrahepatic bile ducts within the left hepatic lobe. This may manifest clinically as sepsis secondary to ascending cholangitis. Mild coronary artery calcification. Trace interstitial pulmonary edema. Extensive distal colonic diverticulosis Aortic Atherosclerosis (ICD10-I70.0). Electronically Signed   By: Fidela Salisbury MD   On: 01/12/2021 02:44   CT CHEST WO CONTRAST  Result Date: 01/12/2021 CLINICAL DATA:  Dyspnea, fever, sepsis, glomerulonephritis EXAM: CT CHEST, ABDOMEN AND PELVIS WITHOUT CONTRAST TECHNIQUE: Multidetector CT imaging of the chest, abdomen and pelvis was performed following the standard protocol without IV contrast. COMPARISON:  05/26/2020 FINDINGS: CT CHEST FINDINGS Cardiovascular: Mild coronary artery calcification. Global cardiac size within normal limits. No pericardial effusion. The central pulmonary arteries are of normal caliber. Mild atherosclerotic calcification within the thoracic aorta. No aortic aneurysm. Mediastinum/Nodes: The visualized thyroid is unremarkable. No pathologic thoracic adenopathy. Moderate hiatal hernia. The esophagus demonstrates an air-fluid level which may relate to gastroesophageal reflux or esophageal dysmotility. Lungs/Pleura: Mild bibasilar  atelectasis. Trace interstitial pulmonary edema with smooth interlobular septal thickening at the lung bases and apices. No confluent pulmonary infiltrate. No pneumothorax or pleural effusion. The central airways are widely patent. Musculoskeletal: No acute bone abnormality. No suspicious lytic or blastic bone lesion. CT ABDOMEN PELVIS FINDINGS Hepatobiliary: Status post cholecystectomy. Mild pneumobilia suggests prior sphincterotomy. There is, however, hyperdensity noted within the central biliary tree, better appreciated on coronal imaging representing a intraluminal calculus within the central left hepatic bile ducts measuring 10 mm x 18 mm. There is  mild left intrahepatic biliary ductal dilation and fluid opacification of these ducts in keeping with an obstructed biliary tree. There is persistent pneumobilia within the a right hepatic ducts indicating patency of this portion of the biliary tree. The liver is otherwise unremarkable. Pancreas: Unremarkable Spleen: Unremarkable Adrenals/Urinary Tract: The adrenal glands are unremarkable. The kidneys are normal in size and position. No intrarenal or ureteral calculi. No hydronephrosis. The bladder is mildly distended, but is otherwise unremarkable. Stomach/Bowel: Extensive descending and sigmoid colonic diverticulosis. No superimposed inflammatory change. The stomach, small bowel, and large bowel are otherwise unremarkable. Appendix absent. No free intraperitoneal fluid or gas. Vascular/Lymphatic: Mild atherosclerotic calcification within the thoracic aorta. No aortic aneurysm. No pathologic adenopathy within the abdomen and pelvis. Reproductive: Status post hysterectomy. No adnexal masses. Other: Tiny fat containing umbilical hernia.  Rectum unremarkable. Musculoskeletal: No lytic or blastic bone lesion. L4-5 fusion with instrumentation has been performed. No acute bone abnormality. IMPRESSION: Intraluminal calculi within the central left biliary tree with distal  fluid opacification and dilation of the intrahepatic bile ducts within the left hepatic lobe. This may manifest clinically as sepsis secondary to ascending cholangitis. Mild coronary artery calcification. Trace interstitial pulmonary edema. Extensive distal colonic diverticulosis Aortic Atherosclerosis (ICD10-I70.0). Electronically Signed   By: Fidela Salisbury MD   On: 01/12/2021 02:44   DG Chest Port 1 View  Result Date: 01/12/2021 CLINICAL DATA:  Vomiting and fever EXAM: PORTABLE CHEST 1 VIEW COMPARISON:  12/25/2017 FINDINGS: The heart size and mediastinal contours are within normal limits. Both lungs are clear. The visualized skeletal structures are unremarkable. IMPRESSION: No active disease. Electronically Signed   By: Ulyses Jarred M.D.   On: 01/12/2021 00:17   DG ERCP BILIARY & PANCREATIC DUCTS  Result Date: 01/12/2021 CLINICAL DATA:  Choledocholithiasis EXAM: ERCP TECHNIQUE: Multiple spot images obtained with the fluoroscopic device and submitted for interpretation post-procedure. FLUOROSCOPY TIME:  Fluoroscopy Time:  5 minutes 32 seconds Radiation Exposure Index (if provided by the fluoroscopic device): 124.42 mGy COMPARISON:  CT abdomen/pelvis 01/12/2021 FINDINGS: Five intraoperative saved images are submitted for review. The images demonstrate a flexible endoscope in the descending duodenum with wire cannulation of the right intrahepatic ducts. Cholangiogram demonstrates mild biliary ductal dilatation. Filling defects in the distal common bile duct consistent with choledocholithiasis. Subsequent images demonstrate sphincterotomy and balloon sweep of the common duct. IMPRESSION: 1. Choledocholithiasis. 2. ERCP with sphincterotomy and balloon sweeping of the common duct. These images were submitted for radiologic interpretation only. Please see the procedural report for the amount of contrast and the fluoroscopy time utilized. Electronically Signed   By: Jacqulynn Cadet M.D.   On: 01/12/2021 10:49    ECHOCARDIOGRAM COMPLETE  Result Date: 01/12/2021    ECHOCARDIOGRAM REPORT   Patient Name:   MEAGHAN WHISTLER Date of Exam: 01/12/2021 Medical Rec #:  [id number]           Height:       63.0 in Accession #:    [id number]          Weight:       187.6 lb Date of Birth:  05/13/1946          BSA:          1.882 m Patient Age:    29 years            BP:           124/57 mmHg Patient Gender: F  HR:           56 bpm. Exam Location:  Inpatient Procedure: 2D Echo, Cardiac Doppler and Color Doppler Indications:    Dyspnea R06.00  History:        Patient has no prior history of Echocardiogram examinations.                 Risk Factors:Hypertension, Diabetes, Dyslipidemia and GERD.  Sonographer:    Jonelle Sidle Dance Referring Phys: [id number] [place]  1. Left ventricular ejection fraction, by estimation, is 60 to 65%. The left ventricle has normal function. The left ventricle has no regional wall motion abnormalities. Left ventricular diastolic parameters are consistent with Grade II diastolic dysfunction (pseudonormalization).  2. Right ventricular systolic function is normal. The right ventricular size is normal. There is normal pulmonary artery systolic pressure.  3. Left atrial size was moderately dilated.  4. The mitral valve is grossly normal. Trivial mitral valve regurgitation. Moderate mitral annular calcification.  5. Tricuspid valve regurgitation is mild to moderate.  6. The aortic valve is tricuspid. There is mild calcification of the aortic valve. There is mild thickening of the aortic valve. Aortic valve regurgitation is not visualized. Mild aortic valve sclerosis is present, with no evidence of aortic valve stenosis.  7. The inferior vena cava is dilated in size with >50% respiratory variability, suggesting right atrial pressure of 8 mmHg. FINDINGS  Left Ventricle: Left ventricular ejection fraction, by estimation, is 60 to 65%. The left ventricle has normal function. The left  ventricle has no regional wall motion abnormalities. The left ventricular internal cavity size was normal in size. There is  borderline left ventricular hypertrophy. Left ventricular diastolic parameters are consistent with Grade II diastolic dysfunction (pseudonormalization). Right Ventricle: The right ventricular size is normal. Right vetricular wall thickness was not well visualized. Right ventricular systolic function is normal. There is normal pulmonary artery systolic pressure. The tricuspid regurgitant velocity is 2.31 m/s, and with an assumed right atrial pressure of 8 mmHg, the estimated right ventricular systolic pressure is 48.1 mmHg. Left Atrium: Left atrial size was moderately dilated. Right Atrium: Right atrial size was normal in size. Pericardium: There is no evidence of pericardial effusion. Mitral Valve: The mitral valve is grossly normal. There is mild thickening of the mitral valve leaflet(s). There is mild calcification of the mitral valve leaflet(s). Moderate mitral annular calcification. Trivial mitral valve regurgitation. Tricuspid Valve: The tricuspid valve is normal in structure. Tricuspid valve regurgitation is mild to moderate. Aortic Valve: Focal calcification of NCC. The aortic valve is tricuspid. There is mild calcification of the aortic valve. There is mild thickening of the aortic valve. There is mild aortic valve annular calcification. Aortic valve regurgitation is not visualized. Mild aortic valve sclerosis is present, with no evidence of aortic valve stenosis. Pulmonic Valve: The pulmonic valve was grossly normal. Pulmonic valve regurgitation is mild. No evidence of pulmonic stenosis. Aorta: The aortic root, ascending aorta, aortic arch and descending aorta are all structurally normal, with no evidence of dilitation or obstruction. Venous: The inferior vena cava is dilated in size with greater than 50% respiratory variability, suggesting right atrial pressure of 8 mmHg. IAS/Shunts:  The atrial septum is grossly normal.  LEFT VENTRICLE PLAX 2D LVIDd:         3.50 cm  Diastology LVIDs:         2.30 cm  LV e' medial:    7.94 cm/s LV PW:  1.20 cm  LV E/e' medial:  16.2 LV IVS:        1.10 cm  LV e' lateral:   8.92 cm/s LVOT diam:     1.90 cm  LV E/e' lateral: 14.5 LV SV:         58 LV SV Index:   31 LVOT Area:     2.84 cm  RIGHT VENTRICLE             IVC RV Basal diam:  2.30 cm     IVC diam: 2.50 cm RV S prime:     10.00 cm/s TAPSE (M-mode): 2.1 cm LEFT ATRIUM             Index       RIGHT ATRIUM           Index LA diam:        4.20 cm 2.23 cm/m  RA Area:     11.00 cm LA Vol (A2C):   83.9 ml 44.58 ml/m RA Volume:   22.80 ml  12.12 ml/m LA Vol (A4C):   64.6 ml 34.33 ml/m LA Biplane Vol: 73.8 ml 39.22 ml/m  AORTIC VALVE LVOT Vmax:   80.40 cm/s LVOT Vmean:  55.000 cm/s LVOT VTI:    0.206 m  AORTA Ao Root diam: 3.10 cm Ao Asc diam:  3.40 cm MITRAL VALVE                TRICUSPID VALVE MV Area (PHT): 3.91 cm     TR Peak grad:   21.3 mmHg MV Decel Time: 194 msec     TR Vmax:        231.00 cm/s MV E velocity: 129.00 cm/s MV A velocity: 73.10 cm/s   SHUNTS MV E/A ratio:  1.76         Systemic VTI:  0.21 m                             Systemic Diam: 1.90 cm Buford Dresser MD Electronically signed by Buford Dresser MD Signature Date/Time: 01/12/2021/7:03:04 PM    Final     Review Of Systems Constitutional: No fever, chills, weight loss or gain. Eyes: No vision change, wears glasses. No discharge or pain. Ears: No hearing loss, No tinnitus. Respiratory: No asthma, COPD, pneumonias. Positive shortness of breath. No hemoptysis. Cardiovascular: Positive chest pain, no palpitation, leg edema. Gastrointestinal: Positive nausea, vomiting, diarrhea, no constipation. No GI bleed. No hepatitis. Genitourinary: No dysuria, hematuria, kidney stone. No incontinance. 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 Neurological: No headache, stroke, seizures.  Psychiatry: No psych facility admission for anxiety,  depression, suicide. No detox. Skin: No rash. Musculoskeletal: Positive joint pain, fibromyalgia. No neck pain, back pain. Lymphadenopathy: No lymphadenopathy. Hematology: Positive anemia or easy bruising.   Blood pressure (!) 144/64, pulse (!) 44, temperature 97.6 F (36.4 C), temperature source Oral, resp. rate 14, height 5\' 3"  (1.6 m), weight 85.1 kg, SpO2 98 %. Body mass index is 33.23 kg/m. General appearance: alert, cooperative, appears stated age and no distress Head: Normocephalic, atraumatic. Eyes: Green eyes, Pale pink conjunctiva, corneas clear. PERRL, EOM's intact. Neck: No adenopathy, no carotid bruit, no JVD, supple, symmetrical, trachea midline and thyroid not enlarged. Resp: Clear to auscultation bilaterally. Cardio: Bradycardic,Regular rate and rhythm, S1, S2 normal, II/VI systolic murmur, no click, rub or gallop GI: Soft, mildly tender; bowel sounds normal; no organomegaly. Extremities: Trace edema, no cyanosis or clubbing. Skin: Warm and dry.  Neurologic: Alert and oriented X 3, normal strength. Normal coordination.  Assessment/Plan Sinus bradycardia, asymptomatic Chest pain, atypical Abdominal pain S/P ERCP HTN S/P choledocholithiasis Type 2 DM  Add hydralazine 10 mg. q 6 hour for heart rate improvement and systolic hypertension. Small dose D5W for elevated chloride and LFT. Discussed need for Nuclear stress test v/s cardiac catheterization in near future.  Time spent: Review of old records, Lab, x-rays, EKG, other cardiac tests, examination, discussion with patient over 70 minutes.  Birdie Riddle, MD  01/13/2021, 8:39 AM

## 2021-01-13 NOTE — Progress Notes (Signed)
   NAME:  Tina Briggs, MRN:  [id number], DOB:  August 14, 1946, LOS: 1 ADMISSION DATE:  01/11/2021, CONSULTATION DATE:  4/14 REFERRING MD:  Opyd, CHIEF COMPLAINT:  Abdominal pain   History of Present Illness:  75 y/o femal presented to the [facility] ED on 4/14 complaining of abominal pain, noted to be in septic shock from cholangitis requiring norepinephrine.  Underwent ERCP on 4/15 with stone removal and balloon extraction.    Pertinent  Medical History  Chronic Anemia Depression Diverticulosis DM2 GERD Membranous glomerulonephropathy Hyperlipidemia Hypertension Lumbar spinal stenosis Obesity   Significant Hospital Events: Including procedures, antibiotic start and stop dates in addition to other pertinent events    Admitted with septic shock, presumed choledocholithiasis  CT abdomen/pelvis 4/15 >> intraluminal calculi in central left biliary tree, distal fluid opacification and dilation left intrahepatic bile ducts suggestive of ascending cholangitis.  Trace interstitial pulmonary edema, extensive distal colonic diverticulosis.  CT chest 4/15 >> mild bibasilar atelectasis, trace interstitial pulmonary edema with smooth interlobular septal thickening at the bases and apices.  Meropenem started 4/15   Interim History / Subjective:   I saw her briefly on rounds She feels weak but improved Still has some soreness in her abdomen Now off vasopressors Recovering well  Agree with management of acute cholangitis by [facility] and GI services  PCCM will sign off, available PRN  Roselie Awkward, MD [facility] Pager: [phone] Cell: [phone] If no response, please call [phone] until 7pm After 7:00 pm call Elink  [phone]

## 2021-01-14 LAB — COMPREHENSIVE METABOLIC PANEL
ALT: 148 U/L — ABNORMAL HIGH (ref 0–44)
AST: 44 U/L — ABNORMAL HIGH (ref 15–41)
Albumin: 3 g/dL — ABNORMAL LOW (ref 3.5–5.0)
Alkaline Phosphatase: 81 U/L (ref 38–126)
Anion gap: 9 (ref 5–15)
BUN: 46 mg/dL — ABNORMAL HIGH (ref 8–23)
CO2: 18 mmol/L — ABNORMAL LOW (ref 22–32)
Calcium: 8.1 mg/dL — ABNORMAL LOW (ref 8.9–10.3)
Chloride: 114 mmol/L — ABNORMAL HIGH (ref 98–111)
Creatinine, Ser: 1.63 mg/dL — ABNORMAL HIGH (ref 0.44–1.00)
GFR, Estimated: 33 mL/min — ABNORMAL LOW (ref >60)
Glucose, Bld: 119 mg/dL — ABNORMAL HIGH (ref 70–99)
Potassium: 3.5 mmol/L (ref 3.5–5.1)
Sodium: 141 mmol/L (ref 135–145)
Total Bilirubin: 1 mg/dL (ref 0.3–1.2)
Total Protein: 5.4 g/dL — ABNORMAL LOW (ref 6.5–8.1)

## 2021-01-14 LAB — CBC WITH DIFFERENTIAL/PLATELET
Abs Immature Granulocytes: NONE SEEN 10*3/uL (ref 0.00–0.07)
Band Neutrophils: 4 %
Basophils Relative: 0 %
Blasts: NONE SEEN %
Eosinophils Relative: 5 %
HCT: 32 % — ABNORMAL LOW (ref 36.0–46.0)
Hemoglobin: 10.5 g/dL — ABNORMAL LOW (ref 12.0–15.0)
Immature Granulocytes: NONE SEEN %
Lymphocytes Relative: 6 %
MCH: 29.7 pg (ref 26.0–34.0)
MCHC: 32.8 g/dL (ref 30.0–36.0)
MCV: 90.7 fL (ref 80.0–100.0)
Metamyelocytes Relative: NONE SEEN %
Monocytes Relative: 0 %
Myelocytes: NONE SEEN %
Neutrophils Relative %: 85 %
Platelets: 108 10*3/uL — ABNORMAL LOW (ref 150–400)
Promyelocytes Relative: NONE SEEN %
RBC: 3.53 MIL/uL — ABNORMAL LOW (ref 3.87–5.11)
RDW: 14.6 % (ref 11.5–15.5)
WBC Morphology: NORMAL
WBC: 11.8 10*3/uL — ABNORMAL HIGH (ref 4.0–10.5)
nRBC: 0 % (ref 0.0–0.2)
nRBC: NONE SEEN /100 WBC

## 2021-01-14 LAB — GLUCOSE, CAPILLARY
Glucose-Capillary: 129 mg/dL — ABNORMAL HIGH (ref 70–99)
Glucose-Capillary: 119 mg/dL — ABNORMAL HIGH (ref 70–99)
Glucose-Capillary: 126 mg/dL — ABNORMAL HIGH (ref 70–99)
Glucose-Capillary: 194 mg/dL — ABNORMAL HIGH (ref 70–99)
Glucose-Capillary: 164 mg/dL — ABNORMAL HIGH (ref 70–99)

## 2021-01-14 LAB — PROCALCITONIN: Procalcitonin: 28.07 ng/mL

## 2021-01-14 MED ORDER — CHOLESTYRAMINE LIGHT 4 G PO PACK
4.0000 g | PACK | ORAL | Status: DC
  Administered 2021-01-15: 4 g via ORAL
  Filled 2021-01-14 (×3): qty 1

## 2021-01-14 MED ORDER — CHOLESTYRAMINE LIGHT 4 G PO PACK
4.0000 g | PACK | Freq: Once | ORAL | Status: AC
  Administered 2021-01-14: 4 g via ORAL
  Filled 2021-01-14: qty 1

## 2021-01-14 MED ORDER — ACETAMINOPHEN 325 MG PO TABS
650.0000 mg | ORAL_TABLET | Freq: Four times a day (QID) | ORAL | Status: DC | PRN
  Administered 2021-01-14: 650 mg via ORAL
  Filled 2021-01-14: qty 2

## 2021-01-14 MED ORDER — HYDRALAZINE HCL 10 MG PO TABS
10.0000 mg | ORAL_TABLET | Freq: Two times a day (BID) | ORAL | Status: DC
  Administered 2021-01-14 – 2021-01-15 (×3): 10 mg via ORAL
  Filled 2021-01-14 (×3): qty 1

## 2021-01-14 MED ORDER — POTASSIUM PHOSPHATE MONOBASIC 500 MG PO TABS
500.0000 mg | ORAL_TABLET | Freq: Two times a day (BID) | ORAL | Status: AC
  Administered 2021-01-14 – 2021-01-16 (×4): 500 mg via ORAL
  Filled 2021-01-14 (×4): qty 1

## 2021-01-14 NOTE — Consult Note (Signed)
Ref: Wenda Low, MD   Subjective:  Neck and shoulder pain resolved. T max 99.7 degree F. Heart rate in 60-70's with low BP. Abdominal discomfort persist. LFTs are improving.  Objective:  Vital Signs in the last 24 hours: Temp:  [97.3 F (36.3 C)-99.7 F (37.6 C)] 98.6 F (37 C) (04/17 0836) Pulse Rate:  [40-72] 60 (04/17 0800) Cardiac Rhythm: Normal sinus rhythm (04/17 0400) Resp:  [14-29] 15 (04/17 0800) BP: (95-165)/(35-84) 110/48 (04/17 0800) SpO2:  [89 %-100 %] 94 % (04/17 0800)  Physical Exam: BP Readings from Last 1 Encounters:  01/14/21 (!) 110/48     Wt Readings from Last 1 Encounters:  01/12/21 85.1 kg    Weight change:  Body mass index is 33.23 kg/m. HEENT: [place]/AT, Eyes-Green, Conjunctiva-Pale pink, Sclera-Non-icteric Neck: No JVD, No bruit, Trachea midline. Lungs:  Clear, Bilateral. Cardiac:  Regular rhythm, normal S1 and S2, no S3. II/VI systolic murmur. Abdomen:  Soft, mildly tender. BS present. Extremities:  No edema present. No cyanosis. No clubbing. CNS: AxOx3, Cranial nerves grossly intact, moves all 4 extremities.  Skin: Warm and dry.   Intake/Output from previous day: 04/16 0701 - 04/17 0700 In: 661.5 [I.V.:464; IV Piggyback:197.5] Out: -     Lab Results: BMET    Component Value Date/Time   NA 141 01/14/2021 0255   NA 140 01/13/2021 0548   NA 143 01/12/2021 0742   K 3.5 01/14/2021 0255   K 4.5 01/13/2021 0548   K 3.4 (L) 01/12/2021 0742   CL 114 (H) 01/14/2021 0255   CL 112 (H) 01/13/2021 0548   CL 116 (H) 01/12/2021 0742   CO2 18 (L) 01/14/2021 0255   CO2 18 (L) 01/13/2021 0548   CO2 17 (L) 01/12/2021 0742   GLUCOSE 119 (H) 01/14/2021 0255   GLUCOSE 143 (H) 01/13/2021 0548   GLUCOSE 144 (H) 01/12/2021 0742   BUN 46 (H) 01/14/2021 0255   BUN 46 (H) 01/13/2021 0548   BUN 37 (H) 01/12/2021 0742   CREATININE 1.63 (H) 01/14/2021 0255   CREATININE 1.99 (H) 01/13/2021 0548   CREATININE 1.68 (H) 01/12/2021 0742   CALCIUM 8.1 (L)  01/14/2021 0255   CALCIUM 7.8 (L) 01/13/2021 0548   CALCIUM 8.0 (L) 01/12/2021 0742   GFRNONAA 33 (L) 01/14/2021 0255   GFRNONAA 26 (L) 01/13/2021 0548   GFRNONAA 32 (L) 01/12/2021 0742   GFRAA >60 10/19/2018 0520   GFRAA 59 (L) 10/18/2018 0554   GFRAA 56 (L) 10/17/2018 0511   CBC    Component Value Date/Time   WBC 11.8 (H) 01/14/2021 0255   RBC 3.53 (L) 01/14/2021 0255   HGB 10.5 (L) 01/14/2021 0255   HGB 11.7 08/26/2017 1255   HCT 32.0 (L) 01/14/2021 0255   HCT 34.7 (L) 08/26/2017 1255   PLT 108 (L) 01/14/2021 0255   PLT 180 08/26/2017 1255   MCV 90.7 01/14/2021 0255   MCV 87.2 08/26/2017 1255   MCH 29.7 01/14/2021 0255   MCHC 32.8 01/14/2021 0255   RDW 14.6 01/14/2021 0255   RDW 14.8 (H) 08/26/2017 1255   LYMPHSABS 1.1 01/13/2021 0548   LYMPHSABS 1.8 08/26/2017 1255   MONOABS 0.6 01/13/2021 0548   MONOABS 0.4 08/26/2017 1255   EOSABS 0.0 01/13/2021 0548   EOSABS 0.2 08/26/2017 1255   BASOSABS 0.0 01/13/2021 0548   BASOSABS 0.0 08/26/2017 1255   HEPATIC Function Panel Recent Labs    01/12/21 0742 01/13/21 0548 01/14/21 0255  PROT 5.3* 5.5* 5.4*   HEMOGLOBIN A1C No  components found for: HGA1C,  MPG CARDIAC ENZYMES No results found for: CKTOTAL, CKMB, CKMBINDEX, TROPONINI BNP No results for input(s): PROBNP in the last 8760 hours. TSH No results for input(s): TSH in the last 8760 hours. CHOLESTEROL No results for input(s): CHOL in the last 8760 hours.  Scheduled Meds: . Chlorhexidine Gluconate Cloth  6 each Topical Daily  . cholestyramine light  4 g Oral QODAY  . gabapentin  600 mg Oral QHS   And  . gabapentin  300 mg Oral Daily  . heparin  5,000 Units Subcutaneous Q8H  . hydrALAZINE  10 mg Oral BID  . insulin aspart  0-9 Units Subcutaneous Q4H  . mouth rinse  15 mL Mouth Rinse BID  . pantoprazole (PROTONIX) IV  40 mg Intravenous Q24H  . sodium chloride flush  10 mL Intravenous Q12H  . venlafaxine XR  75 mg Oral q morning   Continuous Infusions: .  dextrose 30 mL/hr at 01/14/21 0458  . meropenem (MERREM) IV Stopped (01/14/21 0415)   PRN Meds:.ondansetron **OR** ondansetron (ZOFRAN) IV  Assessment/Plan: Sinus bradycardia, resolved Atypical chest pain resolved Abdominal pain S/P ERCP S/P choledocholithiasis with extraction Type 2 DM HTN  Decrease hydralazine to bid. Cardiac interventions on OP basis. Increase activity. F/U in 1 week post discharge.   LOS: 2 days   Time spent including chart review, lab review, examination, discussion with patient/Nurse : 30 min   Dixie Dials  MD  01/14/2021, 9:54 AM

## 2021-01-14 NOTE — Progress Notes (Signed)
PROGRESS NOTE    DOROTHEY OETKEN  JSE:[id number] DOB: 08-10-1946 DOA: 01/11/2021 PCP: Wenda Low, MD   Brief Narrative:  Per admitting physician: Genevive Bi a 75 y.o.femalewith medical history significant forchronic kidney disease, type 2 diabetes mellitus, hypertension, remote cholecystectomy, and choledocholithiasis in 2016 status post ERCP with sphincterotomy and stone extraction, now presenting to emergency department with right upper quadrant pain, fevers, nausea, vomiting, and confusion. The patient reports that she developed right upper quadrant pain and nausea 1 week ago which resolved before returning again and then becoming more constant and severe over the past 3 days. She has not eaten in the past day due to nausea and has developed progressive pain, fevers, lethargy, and confusion. She felt too weak to sit up last night and EMS was called. She was found to have temperature of 103.9 F.She was given 1 L of IV fluids by EMS prior to arrival in the ED. She denies any chest pain, cough, shortness of breath, or diarrhea.  In the ER patient was noted to have clinical findings concerning for ascending cholangitis. She was placed on broad-spectrum antibiotics, blood cultures were collected, fluids were administered, GI was informed, with plans for ERCP   Assessment & Plan:   Principal Problem:   Ascending cholangitis Active Problems:   Essential hypertension   Diabetes mellitus type 2 in obese (HCC)   Chronic kidney disease, stage 3 (HCC)   Sepsis with acute hypoxic respiratory failure and septic shock (HCC)   Acute encephalopathy   Thrombocytopenia (HCC)   Acute respiratory failure with hypoxia ([place])   1.Acute cholangitis; septic shock -Presents with RUQ pain, fevers, confusion, and found on CT to have calculi in central biliary tree on left with intrahepatic biliary dilation on left and elevated LFTs in hepatocellular pattern  - She was given 1  liter IVF with EMS -Blood cultures collected in ED, 2 more liters IVF given, and meropenem started -Confusion resolved and BP was improving but trendedback down; she has received 3 liters of IVF, has new supplemental O2 requirement, and developing interstitial edema noted on CT  - Appreciate GI assist,  now status post RCP with removal of stone -Continue meropenem, continue albumin, startednorepinephrinebut has been since weaned off  2.Renally insufficiency -SCr is 1.70 on admission, up from 1.05 in January 2020 -Progression in CKD vs AKI -Holding ARB, renally-dose medications, monitor  3.Acute encephalopathy -Resolved -Presented with confusion which was likely secondary to acute infection and has resolved  4.Thrombocytopenia -Platelets 110k on admissionnow 108 -Initial decrease likely secondary to infection -Treat infection and monitor  5.Hypertension -Improved still soft -Hold antihypertensives  6.Type II DM -No recent A1c in EMR -Check CBGs and use a low-intensity SSI   7. Acute hypoxic respiratory failure  -Patient denies SOB, chest pain, or cough but now requiring 4 Lpm supplemental O2 and has developing edema on chest CT -EF was normal on remote echo  - Cautious with IVF, update echo, continue supplemental O2 as needed  8. Bradycardia with pause: -Patient noted to be bradycardic post procedure -Monitored for anesthesia washout and post procedure recovery but had persistent bradycardia as well as a 2.9-second pause -Cardiology saw her in consultation, Added hydralazine -We will continue to follow with recommendations for ischemic work-up per cards -Improving  9. Bacteremia: -Follow cultures, continue antibiotics for now, still awaiting sensitivities -Once speciation and sensitivities established will discuss with ID  DVT prophylaxis: Heparin SQ  Code Status: full    Code Status Orders  (From  admission,  onward)         Start     Ordered   01/12/21 0612  Full code  Continuous        01/12/21 0613        Code Status History    Date Active Date Inactive Code Status Order ID Comments User Context   10/15/2018 0936 10/20/2018 1349 Full Code [id number]  Karmen Bongo, MD ED   09/16/2018 1809 09/17/2018 1457 Full Code [id number]  Eustace Moore, MD Inpatient   12/31/2017 1941 01/01/2018 1804 Full Code [id number]  Eustace Moore, MD Inpatient   06/02/2015 1744 06/07/2015 1432 Full Code [id number]  Hosie Poisson, MD Inpatient   05/24/2015 2214 05/27/2015 1703 Full Code [id number]  Theressa Millard, MD Inpatient   Advance Care Planning Activity    Advance Directive Documentation   Flowsheet Row Most Recent Value  Type of Advance Directive Healthcare Power of Attorney  Pre-existing out of facility DNR order (yellow form or pink MOST form) --  "MOST" Form in Place? --     Family Communication: Called husband, no answer left message Disposition Plan:   Patient remained inpatient for continued evaluation and treatment of bradycardia, sinus pauses, IV antibiotics.  Patient not yet stable for discharge Consults called: None Admission status: Inpatient   Consultants:   GI, critical care, cardiology   Procedures:  CT ABDOMEN PELVIS WO CONTRAST  Result Date: 01/12/2021 CLINICAL DATA:  Dyspnea, fever, sepsis, glomerulonephritis EXAM: CT CHEST, ABDOMEN AND PELVIS WITHOUT CONTRAST TECHNIQUE: Multidetector CT imaging of the chest, abdomen and pelvis was performed following the standard protocol without IV contrast. COMPARISON:  05/26/2020 FINDINGS: CT CHEST FINDINGS Cardiovascular: Mild coronary artery calcification. Global cardiac size within normal limits. No pericardial effusion. The central pulmonary arteries are of normal caliber. Mild atherosclerotic calcification within the thoracic aorta. No aortic aneurysm. Mediastinum/Nodes: The visualized thyroid is unremarkable. No pathologic thoracic adenopathy.  Moderate hiatal hernia. The esophagus demonstrates an air-fluid level which may relate to gastroesophageal reflux or esophageal dysmotility. Lungs/Pleura: Mild bibasilar atelectasis. Trace interstitial pulmonary edema with smooth interlobular septal thickening at the lung bases and apices. No confluent pulmonary infiltrate. No pneumothorax or pleural effusion. The central airways are widely patent. Musculoskeletal: No acute bone abnormality. No suspicious lytic or blastic bone lesion. CT ABDOMEN PELVIS FINDINGS Hepatobiliary: Status post cholecystectomy. Mild pneumobilia suggests prior sphincterotomy. There is, however, hyperdensity noted within the central biliary tree, better appreciated on coronal imaging representing a intraluminal calculus within the central left hepatic bile ducts measuring 10 mm x 18 mm. There is mild left intrahepatic biliary ductal dilation and fluid opacification of these ducts in keeping with an obstructed biliary tree. There is persistent pneumobilia within the a right hepatic ducts indicating patency of this portion of the biliary tree. The liver is otherwise unremarkable. Pancreas: Unremarkable Spleen: Unremarkable Adrenals/Urinary Tract: The adrenal glands are unremarkable. The kidneys are normal in size and position. No intrarenal or ureteral calculi. No hydronephrosis. The bladder is mildly distended, but is otherwise unremarkable. Stomach/Bowel: Extensive descending and sigmoid colonic diverticulosis. No superimposed inflammatory change. The stomach, small bowel, and large bowel are otherwise unremarkable. Appendix absent. No free intraperitoneal fluid or gas. Vascular/Lymphatic: Mild atherosclerotic calcification within the thoracic aorta. No aortic aneurysm. No pathologic adenopathy within the abdomen and pelvis. Reproductive: Status post hysterectomy. No adnexal masses. Other: Tiny fat containing umbilical hernia.  Rectum unremarkable. Musculoskeletal: No lytic or blastic bone  lesion. L4-5 fusion with instrumentation has been performed. No acute bone  abnormality. IMPRESSION: Intraluminal calculi within the central left biliary tree with distal fluid opacification and dilation of the intrahepatic bile ducts within the left hepatic lobe. This may manifest clinically as sepsis secondary to ascending cholangitis. Mild coronary artery calcification. Trace interstitial pulmonary edema. Extensive distal colonic diverticulosis Aortic Atherosclerosis (ICD10-I70.0). Electronically Signed   By: Fidela Salisbury MD   On: 01/12/2021 02:44   CT CHEST WO CONTRAST  Result Date: 01/12/2021 CLINICAL DATA:  Dyspnea, fever, sepsis, glomerulonephritis EXAM: CT CHEST, ABDOMEN AND PELVIS WITHOUT CONTRAST TECHNIQUE: Multidetector CT imaging of the chest, abdomen and pelvis was performed following the standard protocol without IV contrast. COMPARISON:  05/26/2020 FINDINGS: CT CHEST FINDINGS Cardiovascular: Mild coronary artery calcification. Global cardiac size within normal limits. No pericardial effusion. The central pulmonary arteries are of normal caliber. Mild atherosclerotic calcification within the thoracic aorta. No aortic aneurysm. Mediastinum/Nodes: The visualized thyroid is unremarkable. No pathologic thoracic adenopathy. Moderate hiatal hernia. The esophagus demonstrates an air-fluid level which may relate to gastroesophageal reflux or esophageal dysmotility. Lungs/Pleura: Mild bibasilar atelectasis. Trace interstitial pulmonary edema with smooth interlobular septal thickening at the lung bases and apices. No confluent pulmonary infiltrate. No pneumothorax or pleural effusion. The central airways are widely patent. Musculoskeletal: No acute bone abnormality. No suspicious lytic or blastic bone lesion. CT ABDOMEN PELVIS FINDINGS Hepatobiliary: Status post cholecystectomy. Mild pneumobilia suggests prior sphincterotomy. There is, however, hyperdensity noted within the central biliary tree, better  appreciated on coronal imaging representing a intraluminal calculus within the central left hepatic bile ducts measuring 10 mm x 18 mm. There is mild left intrahepatic biliary ductal dilation and fluid opacification of these ducts in keeping with an obstructed biliary tree. There is persistent pneumobilia within the a right hepatic ducts indicating patency of this portion of the biliary tree. The liver is otherwise unremarkable. Pancreas: Unremarkable Spleen: Unremarkable Adrenals/Urinary Tract: The adrenal glands are unremarkable. The kidneys are normal in size and position. No intrarenal or ureteral calculi. No hydronephrosis. The bladder is mildly distended, but is otherwise unremarkable. Stomach/Bowel: Extensive descending and sigmoid colonic diverticulosis. No superimposed inflammatory change. The stomach, small bowel, and large bowel are otherwise unremarkable. Appendix absent. No free intraperitoneal fluid or gas. Vascular/Lymphatic: Mild atherosclerotic calcification within the thoracic aorta. No aortic aneurysm. No pathologic adenopathy within the abdomen and pelvis. Reproductive: Status post hysterectomy. No adnexal masses. Other: Tiny fat containing umbilical hernia.  Rectum unremarkable. Musculoskeletal: No lytic or blastic bone lesion. L4-5 fusion with instrumentation has been performed. No acute bone abnormality. IMPRESSION: Intraluminal calculi within the central left biliary tree with distal fluid opacification and dilation of the intrahepatic bile ducts within the left hepatic lobe. This may manifest clinically as sepsis secondary to ascending cholangitis. Mild coronary artery calcification. Trace interstitial pulmonary edema. Extensive distal colonic diverticulosis Aortic Atherosclerosis (ICD10-I70.0). Electronically Signed   By: Fidela Salisbury MD   On: 01/12/2021 02:44   DG Chest Port 1 View  Result Date: 01/12/2021 CLINICAL DATA:  Vomiting and fever EXAM: PORTABLE CHEST 1 VIEW COMPARISON:   12/25/2017 FINDINGS: The heart size and mediastinal contours are within normal limits. Both lungs are clear. The visualized skeletal structures are unremarkable. IMPRESSION: No active disease. Electronically Signed   By: Ulyses Jarred M.D.   On: 01/12/2021 00:17   DG ERCP BILIARY & PANCREATIC DUCTS  Result Date: 01/12/2021 CLINICAL DATA:  Choledocholithiasis EXAM: ERCP TECHNIQUE: Multiple spot images obtained with the fluoroscopic device and submitted for interpretation post-procedure. FLUOROSCOPY TIME:  Fluoroscopy Time:  5 minutes 32  seconds Radiation Exposure Index (if provided by the fluoroscopic device): 124.42 mGy COMPARISON:  CT abdomen/pelvis 01/12/2021 FINDINGS: Five intraoperative saved images are submitted for review. The images demonstrate a flexible endoscope in the descending duodenum with wire cannulation of the right intrahepatic ducts. Cholangiogram demonstrates mild biliary ductal dilatation. Filling defects in the distal common bile duct consistent with choledocholithiasis. Subsequent images demonstrate sphincterotomy and balloon sweep of the common duct. IMPRESSION: 1. Choledocholithiasis. 2. ERCP with sphincterotomy and balloon sweeping of the common duct. These images were submitted for radiologic interpretation only. Please see the procedural report for the amount of contrast and the fluoroscopy time utilized. Electronically Signed   By: Jacqulynn Cadet M.D.   On: 01/12/2021 10:49   ECHOCARDIOGRAM COMPLETE  Result Date: 01/12/2021    ECHOCARDIOGRAM REPORT   Patient Name:   TANAYIA WAHLQUIST Date of Exam: 01/12/2021 Medical Rec #:  [id number]           Height:       63.0 in Accession #:    [id number]          Weight:       187.6 lb Date of Birth:  1946-09-12          BSA:          1.882 m Patient Age:    76 years            BP:           124/57 mmHg Patient Gender: F                   HR:           56 bpm. Exam Location:  Inpatient Procedure: 2D Echo, Cardiac Doppler and Color  Doppler Indications:    Dyspnea R06.00  History:        Patient has no prior history of Echocardiogram examinations.                 Risk Factors:Hypertension, Diabetes, Dyslipidemia and GERD.  Sonographer:    Jonelle Sidle Dance Referring Phys: [id number] [place]  1. Left ventricular ejection fraction, by estimation, is 60 to 65%. The left ventricle has normal function. The left ventricle has no regional wall motion abnormalities. Left ventricular diastolic parameters are consistent with Grade II diastolic dysfunction (pseudonormalization).  2. Right ventricular systolic function is normal. The right ventricular size is normal. There is normal pulmonary artery systolic pressure.  3. Left atrial size was moderately dilated.  4. The mitral valve is grossly normal. Trivial mitral valve regurgitation. Moderate mitral annular calcification.  5. Tricuspid valve regurgitation is mild to moderate.  6. The aortic valve is tricuspid. There is mild calcification of the aortic valve. There is mild thickening of the aortic valve. Aortic valve regurgitation is not visualized. Mild aortic valve sclerosis is present, with no evidence of aortic valve stenosis.  7. The inferior vena cava is dilated in size with >50% respiratory variability, suggesting right atrial pressure of 8 mmHg. FINDINGS  Left Ventricle: Left ventricular ejection fraction, by estimation, is 60 to 65%. The left ventricle has normal function. The left ventricle has no regional wall motion abnormalities. The left ventricular internal cavity size was normal in size. There is  borderline left ventricular hypertrophy. Left ventricular diastolic parameters are consistent with Grade II diastolic dysfunction (pseudonormalization). Right Ventricle: The right ventricular size is normal. Right vetricular wall thickness was not well visualized. Right ventricular systolic function is normal. There is normal pulmonary  artery systolic pressure. The tricuspid  regurgitant velocity is 2.31 m/s, and with an assumed right atrial pressure of 8 mmHg, the estimated right ventricular systolic pressure is 28.4 mmHg. Left Atrium: Left atrial size was moderately dilated. Right Atrium: Right atrial size was normal in size. Pericardium: There is no evidence of pericardial effusion. Mitral Valve: The mitral valve is grossly normal. There is mild thickening of the mitral valve leaflet(s). There is mild calcification of the mitral valve leaflet(s). Moderate mitral annular calcification. Trivial mitral valve regurgitation. Tricuspid Valve: The tricuspid valve is normal in structure. Tricuspid valve regurgitation is mild to moderate. Aortic Valve: Focal calcification of NCC. The aortic valve is tricuspid. There is mild calcification of the aortic valve. There is mild thickening of the aortic valve. There is mild aortic valve annular calcification. Aortic valve regurgitation is not visualized. Mild aortic valve sclerosis is present, with no evidence of aortic valve stenosis. Pulmonic Valve: The pulmonic valve was grossly normal. Pulmonic valve regurgitation is mild. No evidence of pulmonic stenosis. Aorta: The aortic root, ascending aorta, aortic arch and descending aorta are all structurally normal, with no evidence of dilitation or obstruction. Venous: The inferior vena cava is dilated in size with greater than 50% respiratory variability, suggesting right atrial pressure of 8 mmHg. IAS/Shunts: The atrial septum is grossly normal.  LEFT VENTRICLE PLAX 2D LVIDd:         3.50 cm  Diastology LVIDs:         2.30 cm  LV e' medial:    7.94 cm/s LV PW:         1.20 cm  LV E/e' medial:  16.2 LV IVS:        1.10 cm  LV e' lateral:   8.92 cm/s LVOT diam:     1.90 cm  LV E/e' lateral: 14.5 LV SV:         58 LV SV Index:   31 LVOT Area:     2.84 cm  RIGHT VENTRICLE             IVC RV Basal diam:  2.30 cm     IVC diam: 2.50 cm RV S prime:     10.00 cm/s TAPSE (M-mode): 2.1 cm LEFT ATRIUM              Index       RIGHT ATRIUM           Index LA diam:        4.20 cm 2.23 cm/m  RA Area:     11.00 cm LA Vol (A2C):   83.9 ml 44.58 ml/m RA Volume:   22.80 ml  12.12 ml/m LA Vol (A4C):   64.6 ml 34.33 ml/m LA Biplane Vol: 73.8 ml 39.22 ml/m  AORTIC VALVE LVOT Vmax:   80.40 cm/s LVOT Vmean:  55.000 cm/s LVOT VTI:    0.206 m  AORTA Ao Root diam: 3.10 cm Ao Asc diam:  3.40 cm MITRAL VALVE                TRICUSPID VALVE MV Area (PHT): 3.91 cm     TR Peak grad:   21.3 mmHg MV Decel Time: 194 msec     TR Vmax:        231.00 cm/s MV E velocity: 129.00 cm/s MV A velocity: 73.10 cm/s   SHUNTS MV E/A ratio:  1.76         Systemic VTI:  0.21 m  Systemic Diam: 1.90 cm Buford Dresser MD Electronically signed by Buford Dresser MD Signature Date/Time: 01/12/2021/7:03:04 PM    Final      Antimicrobials:   Meropenem   Subjective: Patient reports she started to feel much better Advancing diet  Objective: Vitals:   01/14/21 0513 01/14/21 0600 01/14/21 0800 01/14/21 0836  BP: (!) 124/59 (!) 122/53 (!) 110/48   Pulse:  65 60   Resp:  17 15   Temp:    98.6 F (37 C)  TempSrc:    Oral  SpO2:  95% 94%   Weight:      Height:        Intake/Output Summary (Last 24 hours) at 01/14/2021 1033 Last data filed at 01/14/2021 0343 Gross per 24 hour  Intake 661.5 ml  Output --  Net 661.5 ml   Filed Weights   01/12/21 0012 01/12/21 0600 01/12/21 0934  Weight: 80.7 kg 85.1 kg 85.1 kg    Examination:  General exam: Appears calm and comfortable  Respiratory system: Clear to auscultation. Respiratory effort normal. Cardiovascular system: S1 & S2 heard, RRR. No JVD, murmurs, rubs, gallops or clicks. No pedal edema. Gastrointestinal system: Abdomen is nondistended, soft and mildly tender right upper quadrant post procedure though improving, no organomegaly or masses felt. Normal bowel sounds heard. Central nervous system: Alert and oriented. No focal neurological  deficits. Extremities: Symmetric 5 x 5 power.  Warm well perfused no edema Skin: No rashes, lesions or ulcers Psychiatry: Judgement and insight appear normal. Mood & affect appropriate.     Data Reviewed: I have personally reviewed following labs and imaging studies  CBC: Recent Labs  Lab 01/11/21 2336 01/12/21 0742 01/13/21 0548 01/14/21 0255  WBC 9.5 16.3* 16.5* 11.8*  NEUTROABS 8.7* 13.9* 14.3*  --   HGB 12.2 10.4* 10.6* 10.5*  HCT 37.5 31.3* 32.8* 32.0*  MCV 91.7 92.1 92.9 90.7  PLT 110* 98* 93* 774*   Basic Metabolic Panel: Recent Labs  Lab 01/11/21 2336 01/12/21 0742 01/13/21 0548 01/14/21 0255  NA 141 143 140 141  K 4.1 3.4* 4.5 3.5  CL 113* 116* 112* 114*  CO2 20* 17* 18* 18*  GLUCOSE 154* 144* 143* 119*  BUN 33* 37* 46* 46*  CREATININE 1.70* 1.68* 1.99* 1.63*  CALCIUM 8.3* 8.0* 7.8* 8.1*   GFR: Estimated Creatinine Clearance: 31.3 mL/min (A) (by C-G formula based on SCr of 1.63 mg/dL (H)). Liver Function Tests: Recent Labs  Lab 01/11/21 2336 01/12/21 0742 01/13/21 0548 01/14/21 0255  AST 330* 176* 94* 44*  ALT 425* 272* 194* 148*  ALKPHOS 102 81 76 81  BILITOT 1.3* 0.9 0.8 1.0  PROT 5.8* 5.3* 5.5* 5.4*  ALBUMIN 3.1* 2.9* 3.1* 3.0*   Recent Labs  Lab 01/11/21 2336  LIPASE 39   No results for input(s): AMMONIA in the last 168 hours. Coagulation Profile: Recent Labs  Lab 01/11/21 2336  INR 1.2   Cardiac Enzymes: No results for input(s): CKTOTAL, CKMB, CKMBINDEX, TROPONINI in the last 168 hours. BNP (last 3 results) No results for input(s): PROBNP in the last 8760 hours. HbA1C: No results for input(s): HGBA1C in the last 72 hours. CBG: Recent Labs  Lab 01/13/21 1658 01/13/21 1959 01/13/21 2342 01/14/21 0336 01/14/21 0749  GLUCAP 157* 152* 118* 129* 119*   Lipid Profile: No results for input(s): CHOL, HDL, LDLCALC, TRIG, CHOLHDL, LDLDIRECT in the last 72 hours. Thyroid Function Tests: No results for input(s): TSH, T4TOTAL,  FREET4, T3FREE, THYROIDAB in the last 72 hours.  Anemia Panel: No results for input(s): VITAMINB12, FOLATE, FERRITIN, TIBC, IRON, RETICCTPCT in the last 72 hours. Sepsis Labs: Recent Labs  Lab 01/11/21 2336 01/12/21 0136 01/12/21 0742 01/13/21 0254 01/14/21 0255  PROCALCITON  --   --  53.72 47.20 28.07  LATICACIDVEN 2.6* 1.0  --   --   --     Recent Results (from the past 240 hour(s))  Resp Panel by RT-PCR (Flu A&B, Covid) Nasopharyngeal Swab     Status: None   Collection Time: 01/11/21 11:36 PM   Specimen: Nasopharyngeal Swab; Nasopharyngeal(NP) swabs in vial transport medium  Result Value Ref Range Status   SARS Coronavirus 2 by RT PCR NEGATIVE NEGATIVE Final    Comment: (NOTE) SARS-CoV-2 target nucleic acids are NOT DETECTED.  The SARS-CoV-2 RNA is generally detectable in upper respiratory specimens during the acute phase of infection. The lowest concentration of SARS-CoV-2 viral copies this assay can detect is 138 copies/mL. A negative result does not preclude SARS-Cov-2 infection and should not be used as the sole basis for treatment or other patient management decisions. A negative result may occur with  improper specimen collection/handling, submission of specimen other than nasopharyngeal swab, presence of viral mutation(s) within the areas targeted by this assay, and inadequate number of viral copies(<138 copies/mL). A negative result must be combined with clinical observations, patient history, and epidemiological information. The expected result is Negative.  Fact Sheet for Patients:  EntrepreneurPulse.com.au  Fact Sheet for Healthcare Providers:  IncredibleEmployment.be  This test is no t yet approved or cleared by the [place] FDA and  has been authorized for detection and/or diagnosis of SARS-CoV-2 by FDA under an Emergency Use Authorization (EUA). This EUA will remain  in effect (meaning this test can be used) for the  duration of the COVID-19 declaration under Section 564(b)(1) of the Act, 21 U.S.C.section 360bbb-3(b)(1), unless the authorization is terminated  or revoked sooner.       Influenza A by PCR NEGATIVE NEGATIVE Final   Influenza B by PCR NEGATIVE NEGATIVE Final    Comment: (NOTE) The Xpert Xpress SARS-CoV-2/FLU/RSV plus assay is intended as an aid in the diagnosis of influenza from Nasopharyngeal swab specimens and should not be used as a sole basis for treatment. Nasal washings and aspirates are unacceptable for Xpert Xpress SARS-CoV-2/FLU/RSV testing.  Fact Sheet for Patients: EntrepreneurPulse.com.au  Fact Sheet for Healthcare Providers: IncredibleEmployment.be  This test is not yet approved or cleared by the [place] FDA and has been authorized for detection and/or diagnosis of SARS-CoV-2 by FDA under an Emergency Use Authorization (EUA). This EUA will remain in effect (meaning this test can be used) for the duration of the COVID-19 declaration under Section 564(b)(1) of the Act, 21 U.S.C. section 360bbb-3(b)(1), unless the authorization is terminated or revoked.  Performed at [facility], [place] [address], [place], [place] [address]   Blood Culture (routine x 2)     Status: None (Preliminary result)   Collection Time: 01/11/21 11:36 PM   Specimen: BLOOD  Result Value Ref Range Status   Specimen Description   Final    BLOOD BLOOD LEFT HAND Performed at [place] [address] [place], [place] [address]    Special Requests   Final    BOTTLES DRAWN AEROBIC ONLY Blood Culture results may not be optimal due to an inadequate volume of blood received in culture bottles Performed at [facility] [address], [place], [place] [address]    Culture  Setup Time   Final    GRAM NEGATIVE RODS AEROBIC BOTTLE ONLY CRITICAL VALUE NOTED.  VALUE IS CONSISTENT WITH PREVIOUSLY  REPORTED AND CALLED VALUE.    Culture   Final    GRAM NEGATIVE RODS KLEBSIELLA OXYTOCA CULTURE REINCUBATED FOR BETTER GROWTH Performed at [facility], [place] [address], [place], [place] [address]    Report Status PENDING  Incomplete  Blood Culture (routine x 2)     Status: None (Preliminary result)   Collection Time: 01/11/21 11:41 PM   Specimen: BLOOD  Result Value Ref Range Status   Specimen Description   Final    BLOOD LEFT ANTECUBITAL Performed at [place] [address] [place], [place] [address]    Special Requests   Final    BOTTLES DRAWN AEROBIC AND ANAEROBIC Blood Culture adequate volume Performed at [facility] [address], [place], [place] [address]    Culture  Setup Time   Final    GRAM NEGATIVE RODS IN BOTH AEROBIC AND ANAEROBIC BOTTLES CRITICAL RESULT CALLED TO, READ BACK BY AND VERIFIED WITH: TERRY GREEN PHARMD @1712  01/12/21 EB    Culture   Final    GRAM NEGATIVE RODS IDENTIFICATION AND SUSCEPTIBILITIES TO FOLLOW Performed at [facility], [place] [address], [place],  [address]    Report Status PENDING  Incomplete  Blood Culture ID Panel (Reflexed)     Status: Abnormal   Collection Time: 01/11/21 11:41 PM  Result Value Ref Range Status   Enterococcus faecalis NOT DETECTED NOT DETECTED Final   Enterococcus Faecium NOT DETECTED NOT DETECTED Final   Listeria monocytogenes NOT DETECTED NOT DETECTED Final   Staphylococcus species NOT DETECTED NOT DETECTED Final   Staphylococcus aureus (BCID) NOT DETECTED NOT DETECTED Final   Staphylococcus epidermidis NOT DETECTED NOT DETECTED Final   Staphylococcus lugdunensis NOT DETECTED NOT DETECTED Final   Streptococcus species NOT DETECTED NOT DETECTED Final   Streptococcus agalactiae NOT DETECTED NOT DETECTED Final   Streptococcus pneumoniae NOT DETECTED NOT DETECTED Final   Streptococcus pyogenes NOT DETECTED NOT DETECTED Final   A.calcoaceticus-baumannii  NOT DETECTED NOT DETECTED Final   Bacteroides fragilis NOT DETECTED NOT DETECTED Final   Enterobacterales DETECTED (A) NOT DETECTED Final    Comment: CRITICAL RESULT CALLED TO, READ BACK BY AND VERIFIED WITH: TERRY GREEN PHARMD @1712  01/12/21 EB    Enterobacter cloacae complex NOT DETECTED NOT DETECTED Final   Escherichia coli DETECTED (A) NOT DETECTED Final    Comment: CRITICAL RESULT CALLED TO, READ BACK BY AND VERIFIED WITH: TERRY GREEN PHARMD @1712  01/12/21 EB    Klebsiella aerogenes NOT DETECTED NOT DETECTED Final   Klebsiella oxytoca DETECTED (A) NOT DETECTED Final    Comment: CRITICAL RESULT CALLED TO, READ BACK BY AND VERIFIED WITH: TERRY GREEN PHARMD @1712  01/12/21 EB    Klebsiella pneumoniae NOT DETECTED NOT DETECTED Final   Proteus species NOT DETECTED NOT DETECTED Final   Salmonella species NOT DETECTED NOT DETECTED Final   Serratia marcescens NOT DETECTED NOT DETECTED Final   Haemophilus influenzae NOT DETECTED NOT DETECTED Final   Neisseria meningitidis NOT DETECTED NOT DETECTED Final   Pseudomonas aeruginosa NOT DETECTED NOT DETECTED Final   Stenotrophomonas maltophilia NOT DETECTED NOT DETECTED Final   Candida albicans NOT DETECTED NOT DETECTED Final   Candida auris NOT DETECTED NOT DETECTED Final   Candida glabrata NOT DETECTED NOT DETECTED Final   Candida krusei NOT DETECTED NOT DETECTED Final   Candida parapsilosis NOT  DETECTED NOT DETECTED Final   Candida tropicalis NOT DETECTED NOT DETECTED Final   Cryptococcus neoformans/gattii NOT DETECTED NOT DETECTED Final   CTX-M ESBL NOT DETECTED NOT DETECTED Final   Carbapenem resistance IMP NOT DETECTED NOT DETECTED Final   Carbapenem resistance KPC NOT DETECTED NOT DETECTED Final   Carbapenem resistance NDM NOT DETECTED NOT DETECTED Final   Carbapenem resist OXA 48 LIKE NOT DETECTED NOT DETECTED Final   Carbapenem resistance VIM NOT DETECTED NOT DETECTED Final    Comment: Performed at [facility], [place]  [address] [place], [place] [address]  Urine culture     Status: None   Collection Time: 01/12/21  4:41 AM   Specimen: In/Out Cath Urine  Result Value Ref Range Status   Specimen Description   Final    IN/OUT CATH URINE Performed at [facility] [address] [place], [place] [address]    Special Requests   Final    NONE Performed at [facility], [place] [address] [place], [place] [address]    Culture   Final    NO GROWTH Performed at [facility], [place] [address] [place], [place] [address]    Report Status 01/13/2021 FINAL  Final  MRSA PCR Screening     Status: None   Collection Time: 01/12/21  6:04 AM   Specimen: Nasal Mucosa; Nasopharyngeal  Result Value Ref Range Status   MRSA by PCR NEGATIVE NEGATIVE Final    Comment:        The GeneXpert MRSA Assay (FDA approved for NASAL specimens only), is one component of a comprehensive MRSA colonization surveillance program. It is not intended to diagnose MRSA infection nor to guide or monitor treatment for MRSA infections. Performed at [facility], [place] [address], [place], [place] [address]          Radiology Studies: DG ERCP BILIARY & PANCREATIC DUCTS  Result Date: 01/12/2021 CLINICAL DATA:  Choledocholithiasis EXAM: ERCP TECHNIQUE: Multiple spot images obtained with the fluoroscopic device and submitted for interpretation post-procedure. FLUOROSCOPY TIME:  Fluoroscopy Time:  5 minutes 32 seconds Radiation Exposure Index (if provided by the fluoroscopic device): 124.42 mGy COMPARISON:  CT abdomen/pelvis 01/12/2021 FINDINGS: Five intraoperative saved images are submitted for review. The images demonstrate a flexible endoscope in the descending duodenum with wire cannulation of the right intrahepatic ducts. Cholangiogram demonstrates mild biliary ductal dilatation. Filling defects in the distal common bile duct consistent with choledocholithiasis. Subsequent images  demonstrate sphincterotomy and balloon sweep of the common duct. IMPRESSION: 1. Choledocholithiasis. 2. ERCP with sphincterotomy and balloon sweeping of the common duct. These images were submitted for radiologic interpretation only. Please see the procedural report for the amount of contrast and the fluoroscopy time utilized. Electronically Signed   By: Jacqulynn Cadet M.D.   On: 01/12/2021 10:49   ECHOCARDIOGRAM COMPLETE  Result Date: 01/12/2021    ECHOCARDIOGRAM REPORT   Patient Name:   ATIRA BORELLO Date of Exam: 01/12/2021 Medical Rec #:  [id number]           Height:       63.0 in Accession #:    [id number]          Weight:       187.6 lb Date of Birth:  12-23-1945          BSA:          1.882 m Patient Age:    23 years  BP:           124/57 mmHg Patient Gender: F                   HR:           56 bpm. Exam Location:  Inpatient Procedure: 2D Echo, Cardiac Doppler and Color Doppler Indications:    Dyspnea R06.00  History:        Patient has no prior history of Echocardiogram examinations.                 Risk Factors:Hypertension, Diabetes, Dyslipidemia and GERD.  Sonographer:    Jonelle Sidle Dance Referring Phys: [id number] [place]  1. Left ventricular ejection fraction, by estimation, is 60 to 65%. The left ventricle has normal function. The left ventricle has no regional wall motion abnormalities. Left ventricular diastolic parameters are consistent with Grade II diastolic dysfunction (pseudonormalization).  2. Right ventricular systolic function is normal. The right ventricular size is normal. There is normal pulmonary artery systolic pressure.  3. Left atrial size was moderately dilated.  4. The mitral valve is grossly normal. Trivial mitral valve regurgitation. Moderate mitral annular calcification.  5. Tricuspid valve regurgitation is mild to moderate.  6. The aortic valve is tricuspid. There is mild calcification of the aortic valve. There is mild thickening of the aortic  valve. Aortic valve regurgitation is not visualized. Mild aortic valve sclerosis is present, with no evidence of aortic valve stenosis.  7. The inferior vena cava is dilated in size with >50% respiratory variability, suggesting right atrial pressure of 8 mmHg. FINDINGS  Left Ventricle: Left ventricular ejection fraction, by estimation, is 60 to 65%. The left ventricle has normal function. The left ventricle has no regional wall motion abnormalities. The left ventricular internal cavity size was normal in size. There is  borderline left ventricular hypertrophy. Left ventricular diastolic parameters are consistent with Grade II diastolic dysfunction (pseudonormalization). Right Ventricle: The right ventricular size is normal. Right vetricular wall thickness was not well visualized. Right ventricular systolic function is normal. There is normal pulmonary artery systolic pressure. The tricuspid regurgitant velocity is 2.31 m/s, and with an assumed right atrial pressure of 8 mmHg, the estimated right ventricular systolic pressure is 16.3 mmHg. Left Atrium: Left atrial size was moderately dilated. Right Atrium: Right atrial size was normal in size. Pericardium: There is no evidence of pericardial effusion. Mitral Valve: The mitral valve is grossly normal. There is mild thickening of the mitral valve leaflet(s). There is mild calcification of the mitral valve leaflet(s). Moderate mitral annular calcification. Trivial mitral valve regurgitation. Tricuspid Valve: The tricuspid valve is normal in structure. Tricuspid valve regurgitation is mild to moderate. Aortic Valve: Focal calcification of NCC. The aortic valve is tricuspid. There is mild calcification of the aortic valve. There is mild thickening of the aortic valve. There is mild aortic valve annular calcification. Aortic valve regurgitation is not visualized. Mild aortic valve sclerosis is present, with no evidence of aortic valve stenosis. Pulmonic Valve: The pulmonic  valve was grossly normal. Pulmonic valve regurgitation is mild. No evidence of pulmonic stenosis. Aorta: The aortic root, ascending aorta, aortic arch and descending aorta are all structurally normal, with no evidence of dilitation or obstruction. Venous: The inferior vena cava is dilated in size with greater than 50% respiratory variability, suggesting right atrial pressure of 8 mmHg. IAS/Shunts: The atrial septum is grossly normal.  LEFT VENTRICLE PLAX 2D LVIDd:  3.50 cm  Diastology LVIDs:         2.30 cm  LV e' medial:    7.94 cm/s LV PW:         1.20 cm  LV E/e' medial:  16.2 LV IVS:        1.10 cm  LV e' lateral:   8.92 cm/s LVOT diam:     1.90 cm  LV E/e' lateral: 14.5 LV SV:         58 LV SV Index:   31 LVOT Area:     2.84 cm  RIGHT VENTRICLE             IVC RV Basal diam:  2.30 cm     IVC diam: 2.50 cm RV S prime:     10.00 cm/s TAPSE (M-mode): 2.1 cm LEFT ATRIUM             Index       RIGHT ATRIUM           Index LA diam:        4.20 cm 2.23 cm/m  RA Area:     11.00 cm LA Vol (A2C):   83.9 ml 44.58 ml/m RA Volume:   22.80 ml  12.12 ml/m LA Vol (A4C):   64.6 ml 34.33 ml/m LA Biplane Vol: 73.8 ml 39.22 ml/m  AORTIC VALVE LVOT Vmax:   80.40 cm/s LVOT Vmean:  55.000 cm/s LVOT VTI:    0.206 m  AORTA Ao Root diam: 3.10 cm Ao Asc diam:  3.40 cm MITRAL VALVE                TRICUSPID VALVE MV Area (PHT): 3.91 cm     TR Peak grad:   21.3 mmHg MV Decel Time: 194 msec     TR Vmax:        231.00 cm/s MV E velocity: 129.00 cm/s MV A velocity: 73.10 cm/s   SHUNTS MV E/A ratio:  1.76         Systemic VTI:  0.21 m                             Systemic Diam: 1.90 cm Buford Dresser MD Electronically signed by Buford Dresser MD Signature Date/Time: 01/12/2021/7:03:04 PM    Final         Scheduled Meds: . Chlorhexidine Gluconate Cloth  6 each Topical Daily  . cholestyramine light  4 g Oral QODAY  . gabapentin  600 mg Oral QHS   And  . gabapentin  300 mg Oral Daily  . heparin  5,000 Units  Subcutaneous Q8H  . hydrALAZINE  10 mg Oral BID  . insulin aspart  0-9 Units Subcutaneous Q4H  . mouth rinse  15 mL Mouth Rinse BID  . pantoprazole (PROTONIX) IV  40 mg Intravenous Q24H  . potassium phosphate (monobasic)  500 mg Oral BID WC  . sodium chloride flush  10 mL Intravenous Q12H  . venlafaxine XR  75 mg Oral q morning   Continuous Infusions: . dextrose 30 mL/hr at 01/14/21 0458  . meropenem (MERREM) IV Stopped (01/14/21 0415)     LOS: 2 days    Time spent: 81 min    Nicolette Bang, MD [facility]  If 7PM-7AM, please contact night-coverage  01/14/2021, 10:33 AM

## 2021-01-14 NOTE — Progress Notes (Signed)
Tina Briggs 11:40 AM  Subjective: Patient doing well tolerating diet less abdominal pain no new complaints and she is tolerating her diet  Objective: Vital signs stable afebrile no acute distress abdomen is soft nontender much improved transaminases improved white count improved hemoglobin stable  Assessment: Status post ERCP for CBD stones and probable resolved cholangitis  Plan: We will follow on the computer and follow labs and happy to see back as needed and please call us if we can be of any further assistance with this hospital stay  [facility] E  office [phone] After 5PM or if no answer call [phone]

## 2021-01-14 NOTE — Progress Notes (Signed)
PHARMACY - PHYSICIAN COMMUNICATION CRITICAL VALUE ALERT - BLOOD CULTURE IDENTIFICATION (BCID)  Continues on Merrem for polymicrobial bacteremia. Micro update shows E. Coli, Kleb. Oxytoca, Citrobacter Braakii, and a GPC. Awaiting susceptibilities for E. Coli and Kleb oxtoca. GPC needs further speciation. Afebrile, WBC down to 11.8.  Current antibiotics: Merrem  Changes to prescribed antibiotics recommended:  No change in therapy needed right now Continue Meropenem 1g IV Q12h F/U speciation and susceptibilities   Results for orders placed or performed during the hospital encounter of 01/11/21  Blood Culture ID Panel (Reflexed) (Collected: 01/11/2021 11:41 PM)  Result Value Ref Range   Enterococcus faecalis NOT DETECTED NOT DETECTED   Enterococcus Faecium NOT DETECTED NOT DETECTED   Listeria monocytogenes NOT DETECTED NOT DETECTED   Staphylococcus species NOT DETECTED NOT DETECTED   Staphylococcus aureus (BCID) NOT DETECTED NOT DETECTED   Staphylococcus epidermidis NOT DETECTED NOT DETECTED   Staphylococcus lugdunensis NOT DETECTED NOT DETECTED   Streptococcus species NOT DETECTED NOT DETECTED   Streptococcus agalactiae NOT DETECTED NOT DETECTED   Streptococcus pneumoniae NOT DETECTED NOT DETECTED   Streptococcus pyogenes NOT DETECTED NOT DETECTED   A.calcoaceticus-baumannii NOT DETECTED NOT DETECTED   Bacteroides fragilis NOT DETECTED NOT DETECTED   Enterobacterales DETECTED (A) NOT DETECTED   Enterobacter cloacae complex NOT DETECTED NOT DETECTED   Escherichia coli DETECTED (A) NOT DETECTED   Klebsiella aerogenes NOT DETECTED NOT DETECTED   Klebsiella oxytoca DETECTED (A) NOT DETECTED   Klebsiella pneumoniae NOT DETECTED NOT DETECTED   Proteus species NOT DETECTED NOT DETECTED   Salmonella species NOT DETECTED NOT DETECTED   Serratia marcescens NOT DETECTED NOT DETECTED   Haemophilus influenzae NOT DETECTED NOT DETECTED   Neisseria meningitidis NOT DETECTED NOT DETECTED    Pseudomonas aeruginosa NOT DETECTED NOT DETECTED   Stenotrophomonas maltophilia NOT DETECTED NOT DETECTED   Candida albicans NOT DETECTED NOT DETECTED   Candida auris NOT DETECTED NOT DETECTED   Candida glabrata NOT DETECTED NOT DETECTED   Candida krusei NOT DETECTED NOT DETECTED   Candida parapsilosis NOT DETECTED NOT DETECTED   Candida tropicalis NOT DETECTED NOT DETECTED   Cryptococcus neoformans/gattii NOT DETECTED NOT DETECTED   CTX-M ESBL NOT DETECTED NOT DETECTED   Carbapenem resistance IMP NOT DETECTED NOT DETECTED   Carbapenem resistance KPC NOT DETECTED NOT DETECTED   Carbapenem resistance NDM NOT DETECTED NOT DETECTED   Carbapenem resist OXA 48 LIKE NOT DETECTED NOT DETECTED   Carbapenem resistance VIM NOT DETECTED NOT DETECTED    Adara Kittle J 01/14/2021  1:34 PM

## 2021-01-15 ENCOUNTER — Encounter (HOSPITAL_COMMUNITY): Payer: Self-pay | Admitting: Gastroenterology

## 2021-01-15 DIAGNOSIS — R509 Fever, unspecified: Secondary | ICD-10-CM

## 2021-01-15 DIAGNOSIS — N179 Acute kidney failure, unspecified: Secondary | ICD-10-CM

## 2021-01-15 DIAGNOSIS — G9340 Encephalopathy, unspecified: Secondary | ICD-10-CM | POA: Diagnosis not present

## 2021-01-15 DIAGNOSIS — R7881 Bacteremia: Secondary | ICD-10-CM

## 2021-01-15 DIAGNOSIS — A419 Sepsis, unspecified organism: Secondary | ICD-10-CM | POA: Diagnosis not present

## 2021-01-15 DIAGNOSIS — K8309 Other cholangitis: Secondary | ICD-10-CM

## 2021-01-15 DIAGNOSIS — R1011 Right upper quadrant pain: Secondary | ICD-10-CM

## 2021-01-15 DIAGNOSIS — R6520 Severe sepsis without septic shock: Secondary | ICD-10-CM

## 2021-01-15 DIAGNOSIS — Z880 Allergy status to penicillin: Secondary | ICD-10-CM

## 2021-01-15 LAB — COMPREHENSIVE METABOLIC PANEL
ALT: 102 U/L — ABNORMAL HIGH (ref 0–44)
AST: 23 U/L (ref 15–41)
Albumin: 3 g/dL — ABNORMAL LOW (ref 3.5–5.0)
Alkaline Phosphatase: 76 U/L (ref 38–126)
Anion gap: 9 (ref 5–15)
BUN: 34 mg/dL — ABNORMAL HIGH (ref 8–23)
CO2: 21 mmol/L — ABNORMAL LOW (ref 22–32)
Calcium: 8.3 mg/dL — ABNORMAL LOW (ref 8.9–10.3)
Chloride: 114 mmol/L — ABNORMAL HIGH (ref 98–111)
Creatinine, Ser: 1.37 mg/dL — ABNORMAL HIGH (ref 0.44–1.00)
GFR, Estimated: 41 mL/min — ABNORMAL LOW (ref >60)
Glucose, Bld: 100 mg/dL — ABNORMAL HIGH (ref 70–99)
Potassium: 3.9 mmol/L (ref 3.5–5.1)
Sodium: 144 mmol/L (ref 135–145)
Total Bilirubin: 0.9 mg/dL (ref 0.3–1.2)
Total Protein: 5.5 g/dL — ABNORMAL LOW (ref 6.5–8.1)

## 2021-01-15 LAB — CBC WITH DIFFERENTIAL/PLATELET
Abs Immature Granulocytes: 0.22 10*3/uL — ABNORMAL HIGH (ref 0.00–0.07)
Basophils Absolute: 0.1 10*3/uL (ref 0.0–0.1)
Basophils Relative: 1 %
Eosinophils Absolute: 0.3 10*3/uL (ref 0.0–0.5)
Eosinophils Relative: 7 %
HCT: 33.4 % — ABNORMAL LOW (ref 36.0–46.0)
Hemoglobin: 10.8 g/dL — ABNORMAL LOW (ref 12.0–15.0)
Immature Granulocytes: 4 %
Lymphocytes Relative: 14 %
Lymphs Abs: 0.7 10*3/uL (ref 0.7–4.0)
MCH: 30.2 pg (ref 26.0–34.0)
MCHC: 32.3 g/dL (ref 30.0–36.0)
MCV: 93.3 fL (ref 80.0–100.0)
Monocytes Absolute: 0.4 10*3/uL (ref 0.1–1.0)
Monocytes Relative: 9 %
Neutro Abs: 3.3 10*3/uL (ref 1.7–7.7)
Neutrophils Relative %: 65 %
Platelets: 116 10*3/uL — ABNORMAL LOW (ref 150–400)
RBC: 3.58 MIL/uL — ABNORMAL LOW (ref 3.87–5.11)
RDW: 14.3 % (ref 11.5–15.5)
WBC: 5 10*3/uL (ref 4.0–10.5)
nRBC: 0 % (ref 0.0–0.2)

## 2021-01-15 LAB — GLUCOSE, CAPILLARY
Glucose-Capillary: 108 mg/dL — ABNORMAL HIGH (ref 70–99)
Glucose-Capillary: 144 mg/dL — ABNORMAL HIGH (ref 70–99)
Glucose-Capillary: 154 mg/dL — ABNORMAL HIGH (ref 70–99)
Glucose-Capillary: 156 mg/dL — ABNORMAL HIGH (ref 70–99)
Glucose-Capillary: 163 mg/dL — ABNORMAL HIGH (ref 70–99)
Glucose-Capillary: 84 mg/dL (ref 70–99)
Glucose-Capillary: 90 mg/dL (ref 70–99)

## 2021-01-15 LAB — HEMOGLOBIN A1C
Hgb A1c MFr Bld: 6.7 % — ABNORMAL HIGH (ref 4.8–5.6)
Mean Plasma Glucose: 146 mg/dL

## 2021-01-15 MED ORDER — PIPERACILLIN-TAZOBACTAM 3.375 G IVPB
3.3750 g | Freq: Three times a day (TID) | INTRAVENOUS | Status: AC
  Administered 2021-01-15 – 2021-01-16 (×5): 3.375 g via INTRAVENOUS
  Filled 2021-01-15 (×5): qty 50

## 2021-01-15 MED ORDER — PANTOPRAZOLE SODIUM 40 MG PO TBEC
40.0000 mg | DELAYED_RELEASE_TABLET | Freq: Every day | ORAL | Status: DC
  Administered 2021-01-16 – 2021-01-17 (×2): 40 mg via ORAL
  Filled 2021-01-15 (×2): qty 1

## 2021-01-15 NOTE — Progress Notes (Signed)
PROGRESS NOTE    Tina Briggs  SAY:[id number] DOB: May 16, 1946 DOA: 01/11/2021 PCP: Wenda Low, MD   Brief Narrative:  Per admitting physician: Tina Briggs a 75 y.o.femalewith medical history significant forchronic kidney disease, type 2 diabetes mellitus, hypertension, remote cholecystectomy, and choledocholithiasis in 2016 status post ERCP with sphincterotomy and stone extraction, now presenting to emergency department with right upper quadrant pain, fevers, nausea, vomiting, and confusion. The patient reports that she developed right upper quadrant pain and nausea 1 week ago which resolved before returning again and then becoming more constant and severe over the past 3 days. She has not eaten in the past day due to nausea and has developed progressive pain, fevers, lethargy, and confusion. She felt too weak to sit up last night and EMS was called. She was found to have temperature of 103.9 F.She was given 1 L of IV fluids by EMS prior to arrival in the ED. She denies any chest pain, cough, shortness of breath, or diarrhea.  In the ER patient was noted to have clinical findings concerning for ascending cholangitis. She was placed on broad-spectrum antibiotics, blood cultures were collected, fluids were administered, GI was informed, with plans for ERCP   Assessment & Plan:   Principal Problem:   Bacteremia Active Problems:   Essential hypertension   Diabetes mellitus type 2 in obese (HCC)   Chronic kidney disease, stage 3 (HCC)   Ascending cholangitis   Sepsis with acute hypoxic respiratory failure and septic shock (HCC)   Acute encephalopathy   Thrombocytopenia (HCC)   Acute respiratory failure with hypoxia ([place])   1.Acute cholangitis; septic shock -Presents with RUQ pain, fevers, confusion, and found on CT to have calculi in central biliary tree on left with intrahepatic biliary dilation on left and elevated LFTs in hepatocellular pattern  -  She was given 1 liter IVF with EMS -Blood cultures collected in ED, 2 more liters IVF given, and meropenem started - AppreciateGI assist,now status post ERCP with removal of stone -Discussed the case with infectious disease, discontinue meropenem, changed to Zosyn for penicillin allergy challenge, ID discussed this with patient and patient's husband are in agreement with plan of care per their note.  -Initially was placed onnorepinephrinebut has been since weaned off  2.Renally insufficiency -SCr is 1.70 on admission, up from 1.05 in January 2020 -Currently 1.37 -Progression in CKD vs AKI -Holding ARB, renally-dose medications, monitor  3.Acute encephalopathy -Resolved -Presented with confusion which was likely secondary to acute infection and has resolved  4.Thrombocytopenia -Platelets continue to increase now 116 - Initial decrease likely secondary to infection -Treat infection and monitor  5.Hypertension -Improved still soft -Hold antihypertensives-trach back in as blood pressure improves  6.Type II DM -No recent A1c in EMR -Check CBGs and use a low-intensity SSI   7. Acute hypoxic respiratory failure  -Continue to wean O2 -EF was normal on remote echo  - Cautious with IVF, update echo, continue supplemental O2 as needed  8. Bradycardia with pause: -Patient noted to be bradycardic post procedure -Now resolved -Monitored for anesthesia washout and post procedure recovery but had persistent bradycardia as well as a 2.9-second pause -Cardiology saw her in consultation,Added hydralazine -We will continue to follow with recommendations for ischemic work-up per cards likely as an outpatient   9. Bacteremia: -Polymicrobial with gram-negative rods and gram-positive. -[place] case with ID which discontinue meropenem, and added Zosyn Noted to be penicillin allergic, ID discussed the case with family who are in agreement with  the penicillin challenge  DVT prophylaxis: Heparin SQ  Code Status: Full    Code Status Orders  (From admission, onward)         Start     Ordered   01/12/21 0612  Full code  Continuous        01/12/21 0613        Code Status History    Date Active Date Inactive Code Status Order ID Comments User Context   10/15/2018 0936 10/20/2018 1349 Full Code [id number]  Karmen Bongo, MD ED   09/16/2018 1809 09/17/2018 1457 Full Code [id number]  Eustace Moore, MD Inpatient   12/31/2017 1941 01/01/2018 1804 Full Code [id number]  Eustace Moore, MD Inpatient   06/02/2015 1744 06/07/2015 1432 Full Code [id number]  Hosie Poisson, MD Inpatient   05/24/2015 2214 05/27/2015 1703 Full Code [id number]  Theressa Millard, MD Inpatient   Advance Care Planning Activity    Advance Directive Documentation   Flowsheet Row Most Recent Value  Type of Advance Directive Healthcare Power of Attorney  Pre-existing out of facility DNR order (yellow form or pink MOST form) --  "MOST" Form in Place? --     Family Communication: none today, ID discussed plan of care with pt and husband Disposition Plan:   Patient remained inpatient for continued IV antibiotics for polymicrobial bacteremia.  Patient is not medically ready for discharge Consults called: Infectious disease Admission status: Inpatient   Consultants:   As above  Procedures:  CT ABDOMEN PELVIS WO CONTRAST  Result Date: 01/12/2021 CLINICAL DATA:  Dyspnea, fever, sepsis, glomerulonephritis EXAM: CT CHEST, ABDOMEN AND PELVIS WITHOUT CONTRAST TECHNIQUE: Multidetector CT imaging of the chest, abdomen and pelvis was performed following the standard protocol without IV contrast. COMPARISON:  05/26/2020 FINDINGS: CT CHEST FINDINGS Cardiovascular: Mild coronary artery calcification. Global cardiac size within normal limits. No pericardial effusion. The central pulmonary arteries are of normal caliber. Mild atherosclerotic calcification within the thoracic aorta. No  aortic aneurysm. Mediastinum/Nodes: The visualized thyroid is unremarkable. No pathologic thoracic adenopathy. Moderate hiatal hernia. The esophagus demonstrates an air-fluid level which may relate to gastroesophageal reflux or esophageal dysmotility. Lungs/Pleura: Mild bibasilar atelectasis. Trace interstitial pulmonary edema with smooth interlobular septal thickening at the lung bases and apices. No confluent pulmonary infiltrate. No pneumothorax or pleural effusion. The central airways are widely patent. Musculoskeletal: No acute bone abnormality. No suspicious lytic or blastic bone lesion. CT ABDOMEN PELVIS FINDINGS Hepatobiliary: Status post cholecystectomy. Mild pneumobilia suggests prior sphincterotomy. There is, however, hyperdensity noted within the central biliary tree, better appreciated on coronal imaging representing a intraluminal calculus within the central left hepatic bile ducts measuring 10 mm x 18 mm. There is mild left intrahepatic biliary ductal dilation and fluid opacification of these ducts in keeping with an obstructed biliary tree. There is persistent pneumobilia within the a right hepatic ducts indicating patency of this portion of the biliary tree. The liver is otherwise unremarkable. Pancreas: Unremarkable Spleen: Unremarkable Adrenals/Urinary Tract: The adrenal glands are unremarkable. The kidneys are normal in size and position. No intrarenal or ureteral calculi. No hydronephrosis. The bladder is mildly distended, but is otherwise unremarkable. Stomach/Bowel: Extensive descending and sigmoid colonic diverticulosis. No superimposed inflammatory change. The stomach, small bowel, and large bowel are otherwise unremarkable. Appendix absent. No free intraperitoneal fluid or gas. Vascular/Lymphatic: Mild atherosclerotic calcification within the thoracic aorta. No aortic aneurysm. No pathologic adenopathy within the abdomen and pelvis. Reproductive: Status post hysterectomy. No adnexal masses.  Other: Tiny fat containing umbilical hernia.  Rectum unremarkable. Musculoskeletal: No lytic or blastic bone lesion. L4-5 fusion with instrumentation has been performed. No acute bone abnormality. IMPRESSION: Intraluminal calculi within the central left biliary tree with distal fluid opacification and dilation of the intrahepatic bile ducts within the left hepatic lobe. This may manifest clinically as sepsis secondary to ascending cholangitis. Mild coronary artery calcification. Trace interstitial pulmonary edema. Extensive distal colonic diverticulosis Aortic Atherosclerosis (ICD10-I70.0). Electronically Signed   By: Fidela Salisbury MD   On: 01/12/2021 02:44   CT CHEST WO CONTRAST  Result Date: 01/12/2021 CLINICAL DATA:  Dyspnea, fever, sepsis, glomerulonephritis EXAM: CT CHEST, ABDOMEN AND PELVIS WITHOUT CONTRAST TECHNIQUE: Multidetector CT imaging of the chest, abdomen and pelvis was performed following the standard protocol without IV contrast. COMPARISON:  05/26/2020 FINDINGS: CT CHEST FINDINGS Cardiovascular: Mild coronary artery calcification. Global cardiac size within normal limits. No pericardial effusion. The central pulmonary arteries are of normal caliber. Mild atherosclerotic calcification within the thoracic aorta. No aortic aneurysm. Mediastinum/Nodes: The visualized thyroid is unremarkable. No pathologic thoracic adenopathy. Moderate hiatal hernia. The esophagus demonstrates an air-fluid level which may relate to gastroesophageal reflux or esophageal dysmotility. Lungs/Pleura: Mild bibasilar atelectasis. Trace interstitial pulmonary edema with smooth interlobular septal thickening at the lung bases and apices. No confluent pulmonary infiltrate. No pneumothorax or pleural effusion. The central airways are widely patent. Musculoskeletal: No acute bone abnormality. No suspicious lytic or blastic bone lesion. CT ABDOMEN PELVIS FINDINGS Hepatobiliary: Status post cholecystectomy. Mild pneumobilia  suggests prior sphincterotomy. There is, however, hyperdensity noted within the central biliary tree, better appreciated on coronal imaging representing a intraluminal calculus within the central left hepatic bile ducts measuring 10 mm x 18 mm. There is mild left intrahepatic biliary ductal dilation and fluid opacification of these ducts in keeping with an obstructed biliary tree. There is persistent pneumobilia within the a right hepatic ducts indicating patency of this portion of the biliary tree. The liver is otherwise unremarkable. Pancreas: Unremarkable Spleen: Unremarkable Adrenals/Urinary Tract: The adrenal glands are unremarkable. The kidneys are normal in size and position. No intrarenal or ureteral calculi. No hydronephrosis. The bladder is mildly distended, but is otherwise unremarkable. Stomach/Bowel: Extensive descending and sigmoid colonic diverticulosis. No superimposed inflammatory change. The stomach, small bowel, and large bowel are otherwise unremarkable. Appendix absent. No free intraperitoneal fluid or gas. Vascular/Lymphatic: Mild atherosclerotic calcification within the thoracic aorta. No aortic aneurysm. No pathologic adenopathy within the abdomen and pelvis. Reproductive: Status post hysterectomy. No adnexal masses. Other: Tiny fat containing umbilical hernia.  Rectum unremarkable. Musculoskeletal: No lytic or blastic bone lesion. L4-5 fusion with instrumentation has been performed. No acute bone abnormality. IMPRESSION: Intraluminal calculi within the central left biliary tree with distal fluid opacification and dilation of the intrahepatic bile ducts within the left hepatic lobe. This may manifest clinically as sepsis secondary to ascending cholangitis. Mild coronary artery calcification. Trace interstitial pulmonary edema. Extensive distal colonic diverticulosis Aortic Atherosclerosis (ICD10-I70.0). Electronically Signed   By: Fidela Salisbury MD   On: 01/12/2021 02:44   DG Chest Port 1  View  Result Date: 01/12/2021 CLINICAL DATA:  Vomiting and fever EXAM: PORTABLE CHEST 1 VIEW COMPARISON:  12/25/2017 FINDINGS: The heart size and mediastinal contours are within normal limits. Both lungs are clear. The visualized skeletal structures are unremarkable. IMPRESSION: No active disease. Electronically Signed   By: Ulyses Jarred M.D.   On: 01/12/2021 00:17   DG ERCP BILIARY & PANCREATIC DUCTS  Result Date: 01/12/2021 CLINICAL DATA:  Choledocholithiasis EXAM: ERCP TECHNIQUE: Multiple spot images  obtained with the fluoroscopic device and submitted for interpretation post-procedure. FLUOROSCOPY TIME:  Fluoroscopy Time:  5 minutes 32 seconds Radiation Exposure Index (if provided by the fluoroscopic device): 124.42 mGy COMPARISON:  CT abdomen/pelvis 01/12/2021 FINDINGS: Five intraoperative saved images are submitted for review. The images demonstrate a flexible endoscope in the descending duodenum with wire cannulation of the right intrahepatic ducts. Cholangiogram demonstrates mild biliary ductal dilatation. Filling defects in the distal common bile duct consistent with choledocholithiasis. Subsequent images demonstrate sphincterotomy and balloon sweep of the common duct. IMPRESSION: 1. Choledocholithiasis. 2. ERCP with sphincterotomy and balloon sweeping of the common duct. These images were submitted for radiologic interpretation only. Please see the procedural report for the amount of contrast and the fluoroscopy time utilized. Electronically Signed   By: Jacqulynn Cadet M.D.   On: 01/12/2021 10:49   ECHOCARDIOGRAM COMPLETE  Result Date: 01/12/2021    ECHOCARDIOGRAM REPORT   Patient Name:   ESHA FINCHER Date of Exam: 01/12/2021 Medical Rec #:  [id number]           Height:       63.0 in Accession #:    [id number]          Weight:       187.6 lb Date of Birth:  1945/11/16          BSA:          1.882 m Patient Age:    76 years            BP:           124/57 mmHg Patient Gender: F                    HR:           56 bpm. Exam Location:  Inpatient Procedure: 2D Echo, Cardiac Doppler and Color Doppler Indications:    Dyspnea R06.00  History:        Patient has no prior history of Echocardiogram examinations.                 Risk Factors:Hypertension, Diabetes, Dyslipidemia and GERD.  Sonographer:    Jonelle Sidle Dance Referring Phys: [id number] [place]  1. Left ventricular ejection fraction, by estimation, is 60 to 65%. The left ventricle has normal function. The left ventricle has no regional wall motion abnormalities. Left ventricular diastolic parameters are consistent with Grade II diastolic dysfunction (pseudonormalization).  2. Right ventricular systolic function is normal. The right ventricular size is normal. There is normal pulmonary artery systolic pressure.  3. Left atrial size was moderately dilated.  4. The mitral valve is grossly normal. Trivial mitral valve regurgitation. Moderate mitral annular calcification.  5. Tricuspid valve regurgitation is mild to moderate.  6. The aortic valve is tricuspid. There is mild calcification of the aortic valve. There is mild thickening of the aortic valve. Aortic valve regurgitation is not visualized. Mild aortic valve sclerosis is present, with no evidence of aortic valve stenosis.  7. The inferior vena cava is dilated in size with >50% respiratory variability, suggesting right atrial pressure of 8 mmHg. FINDINGS  Left Ventricle: Left ventricular ejection fraction, by estimation, is 60 to 65%. The left ventricle has normal function. The left ventricle has no regional wall motion abnormalities. The left ventricular internal cavity size was normal in size. There is  borderline left ventricular hypertrophy. Left ventricular diastolic parameters are consistent with Grade II diastolic dysfunction (pseudonormalization). Right Ventricle: The right ventricular size is  normal. Right vetricular wall thickness was not well visualized. Right ventricular  systolic function is normal. There is normal pulmonary artery systolic pressure. The tricuspid regurgitant velocity is 2.31 m/s, and with an assumed right atrial pressure of 8 mmHg, the estimated right ventricular systolic pressure is 81.8 mmHg. Left Atrium: Left atrial size was moderately dilated. Right Atrium: Right atrial size was normal in size. Pericardium: There is no evidence of pericardial effusion. Mitral Valve: The mitral valve is grossly normal. There is mild thickening of the mitral valve leaflet(s). There is mild calcification of the mitral valve leaflet(s). Moderate mitral annular calcification. Trivial mitral valve regurgitation. Tricuspid Valve: The tricuspid valve is normal in structure. Tricuspid valve regurgitation is mild to moderate. Aortic Valve: Focal calcification of NCC. The aortic valve is tricuspid. There is mild calcification of the aortic valve. There is mild thickening of the aortic valve. There is mild aortic valve annular calcification. Aortic valve regurgitation is not visualized. Mild aortic valve sclerosis is present, with no evidence of aortic valve stenosis. Pulmonic Valve: The pulmonic valve was grossly normal. Pulmonic valve regurgitation is mild. No evidence of pulmonic stenosis. Aorta: The aortic root, ascending aorta, aortic arch and descending aorta are all structurally normal, with no evidence of dilitation or obstruction. Venous: The inferior vena cava is dilated in size with greater than 50% respiratory variability, suggesting right atrial pressure of 8 mmHg. IAS/Shunts: The atrial septum is grossly normal.  LEFT VENTRICLE PLAX 2D LVIDd:         3.50 cm  Diastology LVIDs:         2.30 cm  LV e' medial:    7.94 cm/s LV PW:         1.20 cm  LV E/e' medial:  16.2 LV IVS:        1.10 cm  LV e' lateral:   8.92 cm/s LVOT diam:     1.90 cm  LV E/e' lateral: 14.5 LV SV:         58 LV SV Index:   31 LVOT Area:     2.84 cm  RIGHT VENTRICLE             IVC RV Basal diam:  2.30 cm      IVC diam: 2.50 cm RV S prime:     10.00 cm/s TAPSE (M-mode): 2.1 cm LEFT ATRIUM             Index       RIGHT ATRIUM           Index LA diam:        4.20 cm 2.23 cm/m  RA Area:     11.00 cm LA Vol (A2C):   83.9 ml 44.58 ml/m RA Volume:   22.80 ml  12.12 ml/m LA Vol (A4C):   64.6 ml 34.33 ml/m LA Biplane Vol: 73.8 ml 39.22 ml/m  AORTIC VALVE LVOT Vmax:   80.40 cm/s LVOT Vmean:  55.000 cm/s LVOT VTI:    0.206 m  AORTA Ao Root diam: 3.10 cm Ao Asc diam:  3.40 cm MITRAL VALVE                TRICUSPID VALVE MV Area (PHT): 3.91 cm     TR Peak grad:   21.3 mmHg MV Decel Time: 194 msec     TR Vmax:        231.00 cm/s MV E velocity: 129.00 cm/s MV A velocity: 73.10 cm/s   SHUNTS MV E/A ratio:  1.76  Systemic VTI:  0.21 m                             Systemic Diam: 1.90 cm Buford Dresser MD Electronically signed by Buford Dresser MD Signature Date/Time: 01/12/2021/7:03:04 PM    Final      Antimicrobials:   Meropenem x4 days stopped April 18  Zosyn started April 18 by ID   Subjective: Patient reports he is starting to feel better Able to tolerate small amount of p.o. intake this morning  Objective: Vitals:   01/14/21 0900 01/14/21 1000 01/14/21 2010 01/15/21 0410  BP: 98/69 (!) 117/46 131/64 113/68  Pulse: 72 73 65 72  Resp: 18 18 16 16   Temp:   98.6 F (37 C) 99.5 F (37.5 C)  TempSrc:   Oral Oral  SpO2: 95% 94% 97% 97%  Weight:      Height:        Intake/Output Summary (Last 24 hours) at 01/15/2021 1404 Last data filed at 01/15/2021 0903 Gross per 24 hour  Intake 800 ml  Output 1 ml  Net 799 ml   Filed Weights   01/12/21 0012 01/12/21 0600 01/12/21 0934  Weight: 80.7 kg 85.1 kg 85.1 kg    Examination:  General exam: Appears calm and comfortable  Respiratory system: Clear to auscultation. Respiratory effort normal. Cardiovascular system: S1 & S2 heard, RRR. No JVD, murmurs, rubs, gallops or clicks. No pedal edema. Gastrointestinal system: Abdomen is  nondistended, mildly tender to palpation right upper quadrant. No organomegaly or masses felt. Normal bowel sounds heard. Central nervous system: Alert and oriented. No focal neurological deficits. Extremities: Warm well perfused no edema Skin: No rashes, lesions or ulcers Psychiatry: Judgement and insight appear normal. Mood & affect appropriate.     Data Reviewed: I have personally reviewed following labs and imaging studies  CBC: Recent Labs  Lab 01/11/21 2336 01/12/21 0742 01/13/21 0548 01/14/21 0255 01/15/21 0515  WBC 9.5 16.3* 16.5* 11.8* 5.0  NEUTROABS 8.7* 13.9* 14.3*  --  3.3  HGB 12.2 10.4* 10.6* 10.5* 10.8*  HCT 37.5 31.3* 32.8* 32.0* 33.4*  MCV 91.7 92.1 92.9 90.7 93.3  PLT 110* 98* 93* 108* 242*   Basic Metabolic Panel: Recent Labs  Lab 01/11/21 2336 01/12/21 0742 01/13/21 0548 01/14/21 0255 01/15/21 0515  NA 141 143 140 141 144  K 4.1 3.4* 4.5 3.5 3.9  CL 113* 116* 112* 114* 114*  CO2 20* 17* 18* 18* 21*  GLUCOSE 154* 144* 143* 119* 100*  BUN 33* 37* 46* 46* 34*  CREATININE 1.70* 1.68* 1.99* 1.63* 1.37*  CALCIUM 8.3* 8.0* 7.8* 8.1* 8.3*   GFR: Estimated Creatinine Clearance: 37.3 mL/min (A) (by C-G formula based on SCr of 1.37 mg/dL (H)). Liver Function Tests: Recent Labs  Lab 01/11/21 2336 01/12/21 0742 01/13/21 0548 01/14/21 0255 01/15/21 0515  AST 330* 176* 94* 44* 23  ALT 425* 272* 194* 148* 102*  ALKPHOS 102 81 76 81 76  BILITOT 1.3* 0.9 0.8 1.0 0.9  PROT 5.8* 5.3* 5.5* 5.4* 5.5*  ALBUMIN 3.1* 2.9* 3.1* 3.0* 3.0*   Recent Labs  Lab 01/11/21 2336  LIPASE 39   No results for input(s): AMMONIA in the last 168 hours. Coagulation Profile: Recent Labs  Lab 01/11/21 2336  INR 1.2   Cardiac Enzymes: No results for input(s): CKTOTAL, CKMB, CKMBINDEX, TROPONINI in the last 168 hours. BNP (last 3 results) No results for input(s): PROBNP in the last 8760  hours. HbA1C: Recent Labs    01/13/21 0254  HGBA1C 6.7*   CBG: Recent Labs   Lab 01/14/21 2015 01/15/21 0000 01/15/21 0413 01/15/21 0745 01/15/21 1120  GLUCAP 164* 156* 84 90 154*   Lipid Profile: No results for input(s): CHOL, HDL, LDLCALC, TRIG, CHOLHDL, LDLDIRECT in the last 72 hours. Thyroid Function Tests: No results for input(s): TSH, T4TOTAL, FREET4, T3FREE, THYROIDAB in the last 72 hours. Anemia Panel: No results for input(s): VITAMINB12, FOLATE, FERRITIN, TIBC, IRON, RETICCTPCT in the last 72 hours. Sepsis Labs: Recent Labs  Lab 01/11/21 2336 01/12/21 0136 01/12/21 0742 01/13/21 0254 01/14/21 0255  PROCALCITON  --   --  53.72 47.20 28.07  LATICACIDVEN 2.6* 1.0  --   --   --     Recent Results (from the past 240 hour(s))  Resp Panel by RT-PCR (Flu A&B, Covid) Nasopharyngeal Swab     Status: None   Collection Time: 01/11/21 11:36 PM   Specimen: Nasopharyngeal Swab; Nasopharyngeal(NP) swabs in vial transport medium  Result Value Ref Range Status   SARS Coronavirus 2 by RT PCR NEGATIVE NEGATIVE Final    Comment: (NOTE) SARS-CoV-2 target nucleic acids are NOT DETECTED.  The SARS-CoV-2 RNA is generally detectable in upper respiratory specimens during the acute phase of infection. The lowest concentration of SARS-CoV-2 viral copies this assay can detect is 138 copies/mL. A negative result does not preclude SARS-Cov-2 infection and should not be used as the sole basis for treatment or other patient management decisions. A negative result may occur with  improper specimen collection/handling, submission of specimen other than nasopharyngeal swab, presence of viral mutation(s) within the areas targeted by this assay, and inadequate number of viral copies(<138 copies/mL). A negative result must be combined with clinical observations, patient history, and epidemiological information. The expected result is Negative.  Fact Sheet for Patients:  EntrepreneurPulse.com.au  Fact Sheet for Healthcare Providers:   IncredibleEmployment.be  This test is no t yet approved or cleared by the [place] FDA and  has been authorized for detection and/or diagnosis of SARS-CoV-2 by FDA under an Emergency Use Authorization (EUA). This EUA will remain  in effect (meaning this test can be used) for the duration of the COVID-19 declaration under Section 564(b)(1) of the Act, 21 U.S.C.section 360bbb-3(b)(1), unless the authorization is terminated  or revoked sooner.       Influenza A by PCR NEGATIVE NEGATIVE Final   Influenza B by PCR NEGATIVE NEGATIVE Final    Comment: (NOTE) The Xpert Xpress SARS-CoV-2/FLU/RSV plus assay is intended as an aid in the diagnosis of influenza from Nasopharyngeal swab specimens and should not be used as a sole basis for treatment. Nasal washings and aspirates are unacceptable for Xpert Xpress SARS-CoV-2/FLU/RSV testing.  Fact Sheet for Patients: EntrepreneurPulse.com.au  Fact Sheet for Healthcare Providers: IncredibleEmployment.be  This test is not yet approved or cleared by the [place] FDA and has been authorized for detection and/or diagnosis of SARS-CoV-2 by FDA under an Emergency Use Authorization (EUA). This EUA will remain in effect (meaning this test can be used) for the duration of the COVID-19 declaration under Section 564(b)(1) of the Act, 21 U.S.C. section 360bbb-3(b)(1), unless the authorization is terminated or revoked.  Performed at [facility], [place] [address] [place], [place] [address]   Blood Culture (routine x 2)     Status: None (Preliminary result)   Collection Time: 01/11/21 11:36 PM   Specimen: BLOOD  Result Value Ref Range Status   Specimen Description  Final    BLOOD BLOOD LEFT HAND Performed at [facility] [address], [place], [place] [address]    Special Requests   Final    BOTTLES DRAWN AEROBIC ONLY Blood Culture results may not  be optimal due to an inadequate volume of blood received in culture bottles Performed at [facility] [address] [place], [place] [address]    Culture  Setup Time   Final    GRAM NEGATIVE RODS AEROBIC BOTTLE ONLY CRITICAL VALUE NOTED.  VALUE IS CONSISTENT WITH PREVIOUSLY REPORTED AND CALLED VALUE.    Culture   Final    GRAM NEGATIVE RODS KLEBSIELLA OXYTOCA SUSCEPTIBILITIES PERFORMED ON PREVIOUS CULTURE WITHIN THE LAST 5 DAYS. CULTURE REINCUBATED FOR BETTER GROWTH Performed at [facility], [place] [address] [place], [place] [address]    Report Status PENDING  Incomplete  Blood Culture (routine x 2)     Status: Abnormal (Preliminary result)   Collection Time: 01/11/21 11:41 PM   Specimen: BLOOD  Result Value Ref Range Status   Specimen Description BLOOD LEFT ANTECUBITAL  Final   Special Requests   Final    BOTTLES DRAWN AEROBIC AND ANAEROBIC Blood Culture adequate volume   Culture  Setup Time   Final    GRAM NEGATIVE RODS GRAM POSITIVE COCCI IN BOTH AEROBIC AND ANAEROBIC BOTTLES CRITICAL RESULT CALLED TO, READ BACK BY AND VERIFIED WITH: TERRY GREEN PHARMD @1712  01/12/21 EB    Culture (A)  Final    CITROBACTER BRAAKII KLEBSIELLA OXYTOCA GRAM POSITIVE COCCI CULTURE REINCUBATED FOR BETTER GROWTH    Report Status PENDING  Incomplete   Organism ID, Bacteria CITROBACTER BRAAKII  Final   Organism ID, Bacteria KLEBSIELLA OXYTOCA  Final      Susceptibility   Citrobacter braakii - MIC*    CEFAZOLIN >=64 RESISTANT Resistant     CEFEPIME <=0.12 SENSITIVE Sensitive     CEFTAZIDIME <=1 SENSITIVE Sensitive     CEFTRIAXONE <=0.25 SENSITIVE Sensitive     CIPROFLOXACIN <=0.25 SENSITIVE Sensitive     GENTAMICIN <=1 SENSITIVE Sensitive     IMIPENEM 0.5 SENSITIVE Sensitive     TRIMETH/SULFA <=20 SENSITIVE Sensitive     PIP/TAZO <=4 SENSITIVE Sensitive     * CITROBACTER BRAAKII   Klebsiella oxytoca - MIC*    AMPICILLIN >=32 RESISTANT Resistant     CEFAZOLIN  >=64 RESISTANT Resistant     CEFEPIME <=0.12 SENSITIVE Sensitive     CEFTAZIDIME <=1 SENSITIVE Sensitive     CEFTRIAXONE <=0.25 SENSITIVE Sensitive     CIPROFLOXACIN <=0.25 SENSITIVE Sensitive     GENTAMICIN <=1 SENSITIVE Sensitive     IMIPENEM <=0.25 SENSITIVE Sensitive     TRIMETH/SULFA <=20 SENSITIVE Sensitive     AMPICILLIN/SULBACTAM 8 SENSITIVE Sensitive     PIP/TAZO Value in next row Sensitive      <=4 SENSITIVEPerformed at [facility], [address], [place], [place] [address]    * KLEBSIELLA OXYTOCA  Blood Culture ID Panel (Reflexed)     Status: Abnormal   Collection Time: 01/11/21 11:41 PM  Result Value Ref Range Status   Enterococcus faecalis NOT DETECTED NOT DETECTED Final   Enterococcus Faecium NOT DETECTED NOT DETECTED Final   Listeria monocytogenes NOT DETECTED NOT DETECTED Final   Staphylococcus species NOT DETECTED NOT DETECTED Final   Staphylococcus aureus (BCID) NOT DETECTED NOT DETECTED Final   Staphylococcus epidermidis NOT DETECTED NOT DETECTED Final   Staphylococcus lugdunensis NOT DETECTED NOT DETECTED Final  Streptococcus species NOT DETECTED NOT DETECTED Final   Streptococcus agalactiae NOT DETECTED NOT DETECTED Final   Streptococcus pneumoniae NOT DETECTED NOT DETECTED Final   Streptococcus pyogenes NOT DETECTED NOT DETECTED Final   A.calcoaceticus-baumannii NOT DETECTED NOT DETECTED Final   Bacteroides fragilis NOT DETECTED NOT DETECTED Final   Enterobacterales DETECTED (A) NOT DETECTED Final    Comment: CRITICAL RESULT CALLED TO, READ BACK BY AND VERIFIED WITH: TERRY GREEN PHARMD @1712  01/12/21 EB    Enterobacter cloacae complex NOT DETECTED NOT DETECTED Final   Escherichia coli DETECTED (A) NOT DETECTED Final    Comment: CRITICAL RESULT CALLED TO, READ BACK BY AND VERIFIED WITH: TERRY GREEN PHARMD @1712  01/12/21 EB    Klebsiella aerogenes NOT DETECTED NOT DETECTED Final   Klebsiella oxytoca DETECTED (A) NOT DETECTED Final    Comment: CRITICAL  RESULT CALLED TO, READ BACK BY AND VERIFIED WITH: TERRY GREEN PHARMD @1712  01/12/21 EB    Klebsiella pneumoniae NOT DETECTED NOT DETECTED Final   Proteus species NOT DETECTED NOT DETECTED Final   Salmonella species NOT DETECTED NOT DETECTED Final   Serratia marcescens NOT DETECTED NOT DETECTED Final   Haemophilus influenzae NOT DETECTED NOT DETECTED Final   Neisseria meningitidis NOT DETECTED NOT DETECTED Final   Pseudomonas aeruginosa NOT DETECTED NOT DETECTED Final   Stenotrophomonas maltophilia NOT DETECTED NOT DETECTED Final   Candida albicans NOT DETECTED NOT DETECTED Final   Candida auris NOT DETECTED NOT DETECTED Final   Candida glabrata NOT DETECTED NOT DETECTED Final   Candida krusei NOT DETECTED NOT DETECTED Final   Candida parapsilosis NOT DETECTED NOT DETECTED Final   Candida tropicalis NOT DETECTED NOT DETECTED Final   Cryptococcus neoformans/gattii NOT DETECTED NOT DETECTED Final   CTX-M ESBL NOT DETECTED NOT DETECTED Final   Carbapenem resistance IMP NOT DETECTED NOT DETECTED Final   Carbapenem resistance KPC NOT DETECTED NOT DETECTED Final   Carbapenem resistance NDM NOT DETECTED NOT DETECTED Final   Carbapenem resist OXA 48 LIKE NOT DETECTED NOT DETECTED Final   Carbapenem resistance VIM NOT DETECTED NOT DETECTED Final    Comment: Performed at [facility], [address] [place], [place] [address]  Urine culture     Status: None   Collection Time: 01/12/21  4:41 AM   Specimen: In/Out Cath Urine  Result Value Ref Range Status   Specimen Description   Final    IN/OUT CATH URINE Performed at [facility] [address], [place], [place] [address]    Special Requests   Final    NONE Performed at [facility], [place] [address] [place], [place] [address]    Culture   Final    NO GROWTH Performed at [facility], [place] [address], [place], [place] [address]    Report Status 01/13/2021 FINAL  Final  MRSA PCR Screening      Status: None   Collection Time: 01/12/21  6:04 AM   Specimen: Nasal Mucosa; Nasopharyngeal  Result Value Ref Range Status   MRSA by PCR NEGATIVE NEGATIVE Final    Comment:        The GeneXpert MRSA Assay (FDA approved for NASAL specimens only), is one component of a comprehensive MRSA colonization surveillance program. It is not intended to diagnose MRSA infection nor to guide or monitor treatment for MRSA infections. Performed at [facility], [place] [address], [place],  [address]   Culture, blood (routine x 2)     Status: None (  Preliminary result)   Collection Time: 01/14/21 11:03 AM   Specimen: BLOOD  Result Value Ref Range Status   Specimen Description   Final    BLOOD BLOOD LEFT HAND Performed at [facility] [address] [place], [place] [address]    Special Requests   Final    BOTTLES DRAWN AEROBIC ONLY Blood Culture adequate volume Performed at [facility] [address], [place], [place] [address]    Culture   Final    NO GROWTH < 24 HOURS Performed at [facility] [address] [place], [place] [address]    Report Status PENDING  Incomplete  Culture, blood (routine x 2)     Status: None (Preliminary result)   Collection Time: 01/14/21 11:03 AM   Specimen: BLOOD  Result Value Ref Range Status   Specimen Description   Final    BLOOD BLOOD LEFT HAND Performed at [facility] [address], [place], [place] [address]    Special Requests   Final    BOTTLES DRAWN AEROBIC ONLY Blood Culture adequate volume Performed at [facility] [address], [place], [place] [address]    Culture   Final    NO GROWTH < 24 HOURS Performed at [facility] [address] [place], [place] [address]    Report Status PENDING  Incomplete         Radiology Studies: No results found.      Scheduled Meds: . Chlorhexidine Gluconate Cloth  6  each Topical Daily  . cholestyramine light  4 g Oral Q24H  . gabapentin  600 mg Oral QHS   And  . gabapentin  300 mg Oral Daily  . heparin  5,000 Units Subcutaneous Q8H  . hydrALAZINE  10 mg Oral BID  . insulin aspart  0-9 Units Subcutaneous Q4H  . mouth rinse  15 mL Mouth Rinse BID  . [START ON 01/16/2021] pantoprazole  40 mg Oral QAC breakfast  . potassium phosphate (monobasic)  500 mg Oral BID WC  . sodium chloride flush  10 mL Intravenous Q12H  . venlafaxine XR  75 mg Oral q morning   Continuous Infusions: . dextrose 20 mL/hr at 01/15/21 0420  . piperacillin-tazobactam (ZOSYN)  IV 3.375 g (01/15/21 1341)     LOS: 3 days    Time spent: 39 min    Nicolette Bang, MD [facility]  If 7PM-7AM, please contact night-coverage  01/15/2021, 2:04 PM

## 2021-01-15 NOTE — Evaluation (Signed)
Physical Therapy Evaluation Patient Details Name: Tina Briggs MRN: [id number] DOB: [birth year] Today's Date: 01/15/2021   History of Present Illness  75 yo female admitted with cholangitis, septic shock, abd pain, bradycardia. Hx of CKD, DM, lumbar laminectomy  Clinical Impression  On eval, pt required Min A for mobility. She walked ~125 feet with support of IV pole. LOB x 2 during session. Pt is very unsteady without UE support and is at risk for falls when mobilizing. Will plan to follow and progress activity as tolerated. Recommend HHPT vs OP PT after discharge, if pt is agreeable to PT f/u.     Follow Up Recommendations Home health PT;Outpatient PT (if pt agreeable (whichever option works best for pt).)    Equipment Recommendations  None recommended by PT    Recommendations for Other Services       Precautions / Restrictions Precautions Precautions: Fall Restrictions Weight Bearing Restrictions: No      Mobility  Bed Mobility Overal bed mobility: Modified Independent                  Transfers Overall transfer level: Needs assistance   Transfers: Sit to/from Stand Sit to Stand: Min guard         General transfer comment: Unsteady with near fall  Ambulation/Gait Ambulation/Gait assistance: Min assist Gait Distance (Feet): 125 Feet Assistive device: IV Pole Gait Pattern/deviations: Step-through pattern;Decreased stride length     General Gait Details: Unsteady with near fall x 1, especially without UE support. Pt tends to "furniture walk/cruise". Used  1 UE support of IV pole for stability  Stairs            Wheelchair Mobility    Modified Rankin (Stroke Patients Only)       Balance Overall balance assessment: Needs assistance;History of Falls           Standing balance-Leahy Scale: Poor Standing balance comment: very unsteady and at risk for falls when mobilizing                             Pertinent  Vitals/Pain Pain Assessment: No/denies pain    Home Living Family/patient expects to be discharged to:: Private residence Living Arrangements: Spouse/significant other Available Help at Discharge: Family Type of Home: House Home Access: Level entry     Home Layout: One level Home Equipment: Environmental consultant - 2 wheels      Prior Function Level of Independence: Independent         Comments: history of falls     Hand Dominance        Extremity/Trunk Assessment   Upper Extremity Assessment Upper Extremity Assessment: Overall WFL for tasks assessed    Lower Extremity Assessment Lower Extremity Assessment: Generalized weakness    Cervical / Trunk Assessment Cervical / Trunk Assessment: Normal  Communication   Communication: No difficulties  Cognition Arousal/Alertness: Awake/alert Behavior During Therapy: WFL for tasks assessed/performed Overall Cognitive Status: Within Functional Limits for tasks assessed                                        General Comments      Exercises     Assessment/Plan    PT Assessment Patient needs continued PT services  PT Problem List Decreased strength;Decreased mobility;Decreased balance;Decreased activity tolerance;Decreased knowledge of use of DME  PT Treatment Interventions DME instruction;Gait training;Therapeutic exercise;Balance training;Functional mobility training;Therapeutic activities;Patient/family education    PT Goals (Current goals can be found in the Care Plan section)  Acute Rehab PT Goals Patient Stated Goal: to feel better PT Goal Formulation: With patient Time For Goal Achievement: 01/29/21 Potential to Achieve Goals: Good    Frequency Min 3X/week   Barriers to discharge        Co-evaluation               AM-PAC PT "6 Clicks" Mobility  Outcome Measure Help needed turning from your back to your side while in a flat bed without using bedrails?: A Little Help needed moving from  lying on your back to sitting on the side of a flat bed without using bedrails?: A Little Help needed moving to and from a bed to a chair (including a wheelchair)?: A Little Help needed standing up from a chair using your arms (e.g., wheelchair or bedside chair)?: A Little Help needed to walk in hospital room?: A Little Help needed climbing 3-5 steps with a railing? : A Little 6 Click Score: 18    End of Session Equipment Utilized During Treatment: Gait belt Activity Tolerance: Patient tolerated treatment well Patient left: in chair;with call bell/phone within reach;with chair alarm set;with family/visitor present   PT Visit Diagnosis: History of falling (Z91.81);Unsteadiness on feet (R26.81);Muscle weakness (generalized) (M62.81)    Time: 3491-7915 PT Time Calculation (min) (ACUTE ONLY): 12 min   Charges:   PT Evaluation $PT Eval Moderate Complexity: 1 Mod             Doreatha Massed, PT Acute Rehabilitation  Office: [phone] Pager: [phone]

## 2021-01-15 NOTE — Progress Notes (Signed)

## 2021-01-15 NOTE — Consult Note (Signed)
[facility] for Infectious Disease    Date of Admission:  01/11/2021     Reason for Consult: Ascending cholangitis     Referring Physician: [facility]  Current antibiotics: Meropenem 4/14--present  Previous antibiotics: Cefepime 4/14 Vancomycin 4/14   ASSESSMENT:    1. Polymicrobial bacteremia: Secondary to ascending cholangitis after patient presented with severe sepsis with right upper quadrant pain, fevers, encephalopathy, and found to have CT findings of calculi in the central biliary tree on the left with intrahepatic biliary dilation and elevated LFTs in hepatocellular pattern.  She is status post ERCP on 4/15 with stone removal.  Admission blood cultures are polymicrobial with Citrobacter, Klebsiella, and gram-positive cocci with final ID of the GPC pending.  Clinically she has responded to broad-spectrum antibiotics with meropenem and source control after her ERCP 2. Penicillin allergy: She reports a episode of itching after receiving amoxicillin several years ago.  No history of anaphylaxis or angioedema. 3. Acute kidney injury: Creatinine improving and is 1.3 this morning improved from 1.7 at admission  PLAN:    . Discussed with patient and her husband.  We feel comfortable challenging her with penicillin.  Will stop meropenem and change to piperacillin tazobactam.  This will also provide enterococcal coverage in case the [facility] pending identification is Enterococcus vs anaerobe . If she tolerates piperacillin tazobactam we will remove penicillin from her allergy list . Follow cx for ID of GPC . Monitor renal function . Will follow   Principal Problem:   Bacteremia Active Problems:   Essential hypertension   Diabetes mellitus type 2 in obese (HCC)   Chronic kidney disease, stage 3 (HCC)   Ascending cholangitis   Sepsis with acute hypoxic respiratory failure and septic shock (HCC)   Acute encephalopathy   Thrombocytopenia (HCC)   Acute respiratory failure  with hypoxia (HCC)   MEDICATIONS:    Scheduled Meds: . Chlorhexidine Gluconate Cloth  6 each Topical Daily  . cholestyramine light  4 g Oral Q24H  . gabapentin  600 mg Oral QHS   And  . gabapentin  300 mg Oral Daily  . heparin  5,000 Units Subcutaneous Q8H  . hydrALAZINE  10 mg Oral BID  . insulin aspart  0-9 Units Subcutaneous Q4H  . mouth rinse  15 mL Mouth Rinse BID  . [START ON 01/16/2021] pantoprazole  40 mg Oral QAC breakfast  . potassium phosphate (monobasic)  500 mg Oral BID WC  . sodium chloride flush  10 mL Intravenous Q12H  . venlafaxine XR  75 mg Oral q morning   Continuous Infusions: . dextrose 20 mL/hr at 01/15/21 0420  . piperacillin-tazobactam (ZOSYN)  IV     PRN Meds:.acetaminophen, ondansetron **OR** ondansetron (ZOFRAN) IV  HPI:    Tina Briggs is a 75 y.o. female with history of type 2 diabetes, CKD, prior cholecystectomy (48 years ago), and choledocholithiasis (2016) status post ERCP with sphincterotomy and stone extraction presenting this admission with right upper quadrant pain, fevers, nausea, vomiting, and encephalopathy.  She reports that she developed right upper quadrant pain and nausea approximately 1 week prior to admission that subsequently resolved before returning and becoming more constant and severe over the 3 days prior to admission.  Upon admission she was found to have clinical findings concerning for ascending cholangitis and was placed on broad-spectrum antibiotics.  Blood cultures were obtained and GI was consulted with plans for ERCP.  CT was found to have calculi in the central biliary tree on the  left with intrahepatic biliary dilation on the left and elevated LFTs in a hepatocellular pattern.  She underwent ERCP with Dr. Watt Climes on 01/12/2021 with findings of choledocholithiasis and complete removal was accomplished by balloon extraction.  Her LFTs have continued to downtrend, creatinine improved, and leukocytosis has normalized.  Her  admission blood cultures from 4/14 are polymicrobial with Citrobacter, Klebsiella oxytocin, and gram-positive cocci.  The Citrobacter and Klebsiella are both sensitive to piperacillin tazobactam.  Over the last 24 hours she remains hemodynamically stable and afebrile with T-max 99.5.  We have been consulted for further recommendations on antibiotics in the setting of polymicrobial bacteremia.   Past Medical History:  Diagnosis Date  . Anemia   . Aspiration pneumonia ([place])    after common bile duct obstruction  . CATARACT, RIGHT EYE    had repaired  . Depression   . Diverticulitis    recurrent episodes  . DM   . GERD   . 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 Mild to mod TI scarring. (Dr. Lorrene Reid)  . HNP (herniated nucleus pulposus), lumbar    Recurrent  . HYPERLIPIDEMIA-MIXED   . HYPERTENSION, UNSPECIFIED   . OBESITY   . PONV (postoperative nausea and vomiting)    past history only, "sometimes they put a patch on me"    Social History   Tobacco Use  . Smoking status: Never Smoker  . Smokeless tobacco: Never Used  Vaping Use  . Vaping Use: Never used  Substance Use Topics  . Alcohol use: No  . Drug use: No    Family History  Problem Relation Age of Onset  . Cancer Maternal Aunt        breast cancer  . Cancer Maternal Grandmother        kidney cancer   . Cancer Paternal Grandmother        bone cancer   . Heart disease Mother   . Alzheimer's disease Father   . Tremor Father        possible PD  . Diabetes Mellitus II Brother   . Healthy Son   . Healthy Son     Allergies  Allergen Reactions  . Codeine Shortness Of Breath  . Amoxicillin Other (See Comments)  . Clavulanic Acid   . Flagyl [Metronidazole]   . Other Other (See Comments)  . Prevacid [Lansoprazole]   . Tetracyclines & Related Itching  . Trulicity [Dulaglutide]   . Azithromycin Itching and Rash  . Erythromycin Itching and Rash  . Morphine And  Related Itching and Rash  . Penicillins Itching and Rash    Has patient had a PCN reaction causing immediate rash, facial/tongue/throat swelling, SOB or lightheadedness with hypotension: Yes Has patient had a PCN reaction causing severe rash involving mucus membranes or skin necrosis: No Has patient had a PCN reaction that required hospitalization: No Has patient had a PCN reaction occurring within the last 10 years: No If all of the above answers are "NO", then may proceed with Cephalosporin use.   [name] [Sulfamethoxazole-Trimethoprim] Itching and Rash    Review of Systems  Constitutional: Negative for chills and fever.  Respiratory: Negative.   Cardiovascular: Negative.   Gastrointestinal: Negative for abdominal pain, nausea and vomiting.  Genitourinary: Negative.   All other systems reviewed and are negative.   OBJECTIVE:   Blood pressure 113/68, pulse 72, temperature 99.5 F (37.5 C), temperature source Oral, resp. rate 16, height 5\' 3"  (1.6 m), weight 85.1 kg, SpO2 97 %.  Body mass index is 33.23 kg/m.  Physical Exam Constitutional:      Comments: Pleasant appearing, conversant, no acute distress, sitting up in bed.  Her husband is present.  HENT:     Head: Normocephalic and atraumatic.     Mouth/Throat:     Mouth: Mucous membranes are moist.     Pharynx: Oropharynx is clear.  Eyes:     General: No scleral icterus.    Extraocular Movements: Extraocular movements intact.     Conjunctiva/sclera: Conjunctivae normal.  Cardiovascular:     Rate and Rhythm: Normal rate and regular rhythm.     Pulses: Normal pulses.  Pulmonary:     Effort: Pulmonary effort is normal.     Breath sounds: Normal breath sounds.  Abdominal:     General: There is no distension.     Palpations: Abdomen is soft.     Tenderness: There is no abdominal tenderness. There is no guarding.  Musculoskeletal:        General: Normal range of motion.  Skin:    General: Skin is warm and dry.   Neurological:     General: No focal deficit present.     Mental Status: She is oriented to person, place, and time.  Psychiatric:        Mood and Affect: Mood normal.        Behavior: Behavior normal.      Lab Results: Lab Results  Component Value Date   WBC 5.0 01/15/2021   HGB 10.8 (L) 01/15/2021   HCT 33.4 (L) 01/15/2021   MCV 93.3 01/15/2021   PLT 116 (L) 01/15/2021    Lab Results  Component Value Date   NA 144 01/15/2021   K 3.9 01/15/2021   CO2 21 (L) 01/15/2021   GLUCOSE 100 (H) 01/15/2021   BUN 34 (H) 01/15/2021   CREATININE 1.37 (H) 01/15/2021   CALCIUM 8.3 (L) 01/15/2021   GFRNONAA 41 (L) 01/15/2021   GFRAA >60 10/19/2018    Lab Results  Component Value Date   ALT 102 (H) 01/15/2021   AST 23 01/15/2021   ALKPHOS 76 01/15/2021   BILITOT 0.9 01/15/2021    No results found for: CRP  No results found for: ESRSEDRATE  I have reviewed the micro and lab results in Epic.  Imaging: No results found.   Imaging  independently reviewed in Epic.  Raynelle [facility] for Infectious [facility] [phone] pager 01/15/2021, 12:36 PM

## 2021-01-16 DIAGNOSIS — A4189 Other specified sepsis: Principal | ICD-10-CM

## 2021-01-16 DIAGNOSIS — A4181 Sepsis due to Enterococcus: Secondary | ICD-10-CM

## 2021-01-16 DIAGNOSIS — A4151 Sepsis due to Escherichia coli [E. coli]: Secondary | ICD-10-CM

## 2021-01-16 DIAGNOSIS — R7881 Bacteremia: Secondary | ICD-10-CM

## 2021-01-16 LAB — BASIC METABOLIC PANEL
Anion gap: 6 (ref 5–15)
BUN: 25 mg/dL — ABNORMAL HIGH (ref 8–23)
CO2: 20 mmol/L — ABNORMAL LOW (ref 22–32)
Calcium: 8.5 mg/dL — ABNORMAL LOW (ref 8.9–10.3)
Chloride: 116 mmol/L — ABNORMAL HIGH (ref 98–111)
Creatinine, Ser: 1.25 mg/dL — ABNORMAL HIGH (ref 0.44–1.00)
GFR, Estimated: 45 mL/min — ABNORMAL LOW (ref >60)
Glucose, Bld: 114 mg/dL — ABNORMAL HIGH (ref 70–99)
Potassium: 3.9 mmol/L (ref 3.5–5.1)
Sodium: 142 mmol/L (ref 135–145)

## 2021-01-16 LAB — CULTURE, BLOOD (ROUTINE X 2): Special Requests: ADEQUATE

## 2021-01-16 LAB — CBC WITH DIFFERENTIAL/PLATELET
Abs Immature Granulocytes: 0.35 10*3/uL — ABNORMAL HIGH (ref 0.00–0.07)
Basophils Absolute: 0 10*3/uL (ref 0.0–0.1)
Basophils Relative: 1 %
Eosinophils Absolute: 0.4 10*3/uL (ref 0.0–0.5)
Eosinophils Relative: 6 %
HCT: 31.6 % — ABNORMAL LOW (ref 36.0–46.0)
Hemoglobin: 10.5 g/dL — ABNORMAL LOW (ref 12.0–15.0)
Immature Granulocytes: 6 %
Lymphocytes Relative: 23 %
Lymphs Abs: 1.3 10*3/uL (ref 0.7–4.0)
MCH: 30.3 pg (ref 26.0–34.0)
MCHC: 33.2 g/dL (ref 30.0–36.0)
MCV: 91.1 fL (ref 80.0–100.0)
Monocytes Absolute: 0.7 10*3/uL (ref 0.1–1.0)
Monocytes Relative: 11 %
Neutro Abs: 3.1 10*3/uL (ref 1.7–7.7)
Neutrophils Relative %: 53 %
Platelets: 133 10*3/uL — ABNORMAL LOW (ref 150–400)
RBC: 3.47 MIL/uL — ABNORMAL LOW (ref 3.87–5.11)
RDW: 13.9 % (ref 11.5–15.5)
WBC: 5.8 10*3/uL (ref 4.0–10.5)
nRBC: 0 % (ref 0.0–0.2)

## 2021-01-16 LAB — GLUCOSE, CAPILLARY
Glucose-Capillary: 104 mg/dL — ABNORMAL HIGH (ref 70–99)
Glucose-Capillary: 112 mg/dL — ABNORMAL HIGH (ref 70–99)
Glucose-Capillary: 120 mg/dL — ABNORMAL HIGH (ref 70–99)
Glucose-Capillary: 175 mg/dL — ABNORMAL HIGH (ref 70–99)

## 2021-01-16 MED ORDER — INSULIN ASPART 100 UNIT/ML ~~LOC~~ SOLN
0.0000 [IU] | Freq: Three times a day (TID) | SUBCUTANEOUS | Status: DC
  Administered 2021-01-16: 2 [IU] via SUBCUTANEOUS
  Administered 2021-01-17: 1 [IU] via SUBCUTANEOUS

## 2021-01-16 MED ORDER — CIPROFLOXACIN HCL 500 MG PO TABS
500.0000 mg | ORAL_TABLET | Freq: Two times a day (BID) | ORAL | Status: DC
  Administered 2021-01-17: 500 mg via ORAL
  Filled 2021-01-16: qty 1

## 2021-01-16 MED ORDER — HYDRALAZINE HCL 10 MG PO TABS
10.0000 mg | ORAL_TABLET | Freq: Every day | ORAL | Status: DC
  Administered 2021-01-16: 10 mg via ORAL
  Filled 2021-01-16: qty 1

## 2021-01-16 MED ORDER — CIPROFLOXACIN HCL 500 MG PO TABS: 500.0000 mg | ORAL_TABLET | Freq: Two times a day (BID) | ORAL | Status: DC

## 2021-01-16 MED ORDER — POTASSIUM PHOSPHATE MONOBASIC 500 MG PO TABS
500.0000 mg | ORAL_TABLET | Freq: Two times a day (BID) | ORAL | Status: DC
  Administered 2021-01-16 – 2021-01-17 (×2): 500 mg via ORAL
  Filled 2021-01-16 (×2): qty 1

## 2021-01-16 NOTE — Progress Notes (Signed)
PROGRESS NOTE    Tina Briggs  RJJ:[id number] DOB: Feb 17, 1946 DOA: 01/11/2021 PCP: Wenda Low, MD     Brief Narrative:  Tina Briggs a 75 y.o.femalewith medical history significant forchronic kidney disease, type 2 diabetes mellitus, hypertension, remote cholecystectomy, and choledocholithiasis in 2016 status post ERCP with sphincterotomy and stone extraction, now presenting to emergency department with right upper quadrant pain, fevers, nausea, vomiting, and confusion. The patient reports that she developed right upper quadrant pain and nausea 1 week ago which resolved before returning again and then becoming more constant and severe over the past 3 days. She has not eaten in the past day due to nausea and has developed progressive pain, fevers, lethargy, and confusion. She felt too weak to sit up last night and EMS was called. She was found to have temperature of 103.9 F.She was given 1 L of IV fluids by EMS prior to arrival in the ED. She denies any chest pain, cough, shortness of breath, or diarrhea.  In the ER patient was noted to have clinical findings concerning for ascending cholangitis. She was placed on broad-spectrum antibiotics, blood cultures were collected, fluids were administered, GI was informed, with plans for ERCP.   New events last 24 hours / Subjective: Feeling much better, denies any fevers, chills, abdominal pain, nausea, vomiting, itching or other drug reaction symptoms from zosyn.   Assessment & Plan:   Principal Problem:   Bacteremia Active Problems:   Essential hypertension   Diabetes mellitus type 2 in obese (HCC)   Chronic kidney disease, stage 3 (HCC)   Ascending cholangitis   Sepsis with acute hypoxic respiratory failure and septic shock (HCC)   Acute encephalopathy   Thrombocytopenia (HCC)   Acute respiratory failure with hypoxia (HCC)   Septic shock secondary to acute cholangitis Polymicrobial bacteremia -Presented  with RUQ pain, fevers, confusion, and found on CT to have calculi in central biliary tree on left with intrahepatic biliary dilation on left and elevated LFTs in hepatocellular pattern  -Status post ERCP with removal of stone by Dr. Watt Climes 4/15 -Weaned off Levophed -Infectious disease consulted -Merrem--> Zosyn -->Plans to deescalate to cipro 500mg  BID end date 4/25  -Follow up with Dr. Juleen China 4/28   AKI -Improving  Acute metabolic encephalopathy -Resolved  Hypertension -Continue hydralazine  Diabetes mellitus type 2 -Sliding scale insulin  Sinus bradycardia with pause -Resolved -Follow-up with Dr. Doylene Canard as outpatient in 1 week     DVT prophylaxis:  heparin injection 5,000 Units Start: 01/12/21 0700  Code Status:     Code Status Orders  (From admission, onward)         Start     Ordered   01/12/21 0612  Full code  Continuous        01/12/21 0613        Code Status History    Date Active Date Inactive Code Status Order ID Comments User Context   10/15/2018 0936 10/20/2018 1349 Full Code [id number]  Karmen Bongo, MD ED   09/16/2018 1809 09/17/2018 1457 Full Code [id number]  Eustace Moore, MD Inpatient   12/31/2017 1941 01/01/2018 1804 Full Code [id number]  Eustace Moore, MD Inpatient   06/02/2015 1744 06/07/2015 1432 Full Code [id number]  Hosie Poisson, MD Inpatient   05/24/2015 2214 05/27/2015 1703 Full Code [id number]  Theressa Millard, MD Inpatient   Advance Care Planning Activity    Advance Directive Documentation   Flowsheet Row Most Recent Value  Type of Advance Directive Healthcare Power of  Attorney  Pre-existing out of facility DNR order (yellow form or pink MOST form) --  "MOST" Form in Place? --     Family Communication: None at bedside Disposition Plan:  Status is: Inpatient  Remains inpatient appropriate because:IV treatments appropriate due to intensity of illness or inability to take PO   Dispo: The patient is from: Home              Anticipated  d/c is to: Home              Patient currently is not medically stable to d/c.   Difficult to place patient No     Antimicrobials:  Anti-infectives (From admission, onward)   Start     Dose/Rate Route Frequency Ordered Stop   01/17/21 0800  ciprofloxacin (CIPRO) tablet 500 mg  Status:  Discontinued        500 mg Oral 2 times daily 01/16/21 1508 01/16/21 1510   01/17/21 0800  ciprofloxacin (CIPRO) tablet 500 mg        500 mg Oral 2 times daily 01/16/21 1510 01/22/21 2359   01/15/21 1400  piperacillin-tazobactam (ZOSYN) IVPB 3.375 g        3.375 g 12.5 mL/hr over 240 Minutes Intravenous Every 8 hours 01/15/21 1001 01/16/21 2359   01/12/21 0400  meropenem (MERREM) 1 g in sodium chloride 0.9 % 100 mL IVPB  Status:  Discontinued        1 g 200 mL/hr over 30 Minutes Intravenous Every 12 hours 01/12/21 0316 01/15/21 1001   01/12/21 0030  vancomycin (VANCOREADY) IVPB 1500 mg/300 mL        1,500 mg 150 mL/hr over 120 Minutes Intravenous STAT 01/12/21 0015 01/12/21 0327   01/12/21 0030  ceFEPIme (MAXIPIME) 2 g in sodium chloride 0.9 % 100 mL IVPB        2 g 200 mL/hr over 30 Minutes Intravenous STAT 01/12/21 0015 01/12/21 0301        Objective: Vitals:   01/15/21 1446 01/15/21 2052 01/16/21 0416 01/16/21 1357  BP: (!) 119/56 126/67 (!) 99/56 118/67  Pulse: 67 67 (!) 59 67  Resp: 16 18 17 16   Temp: 99.6 F (37.6 C) 99.5 F (37.5 C) 99.1 F (37.3 C) 97.9 F (36.6 C)  TempSrc: Oral Oral Oral Oral  SpO2: 94% 94% 92% 92%  Weight:      Height:        Intake/Output Summary (Last 24 hours) at 01/16/2021 1516 Last data filed at 01/16/2021 0940 Gross per 24 hour  Intake 343.78 ml  Output 1300 ml  Net -956.22 ml   Filed Weights   01/12/21 0012 01/12/21 0600 01/12/21 0934  Weight: 80.7 kg 85.1 kg 85.1 kg    Examination:  General exam: Appears calm and comfortable  Respiratory system: Clear to auscultation. Respiratory effort normal. No respiratory distress. No conversational  dyspnea.  Cardiovascular system: S1 & S2 heard, RRR. No murmurs. No pedal edema. Gastrointestinal system: Abdomen is nondistended, soft and nontender. Normal bowel sounds heard. Central nervous system: Alert and oriented. No focal neurological deficits. Speech clear.  Extremities: Symmetric in appearance  Skin: No rashes, lesions or ulcers on exposed skin  Psychiatry: Judgement and insight appear normal. Mood & affect appropriate.   Data Reviewed: I have personally reviewed following labs and imaging studies  CBC: Recent Labs  Lab 01/11/21 2336 01/12/21 0742 01/13/21 0548 01/14/21 0255 01/15/21 0515 01/16/21 0619  WBC 9.5 16.3* 16.5* 11.8* 5.0 5.8  NEUTROABS 8.7* 13.9* 14.3*  --  3.3 3.1  HGB 12.2 10.4* 10.6* 10.5* 10.8* 10.5*  HCT 37.5 31.3* 32.8* 32.0* 33.4* 31.6*  MCV 91.7 92.1 92.9 90.7 93.3 91.1  PLT 110* 98* 93* 108* 116* 810*   Basic Metabolic Panel: Recent Labs  Lab 01/12/21 0742 01/13/21 0548 01/14/21 0255 01/15/21 0515 01/16/21 0619  NA 143 140 141 144 142  K 3.4* 4.5 3.5 3.9 3.9  CL 116* 112* 114* 114* 116*  CO2 17* 18* 18* 21* 20*  GLUCOSE 144* 143* 119* 100* 114*  BUN 37* 46* 46* 34* 25*  CREATININE 1.68* 1.99* 1.63* 1.37* 1.25*  CALCIUM 8.0* 7.8* 8.1* 8.3* 8.5*   GFR: Estimated Creatinine Clearance: 40.8 mL/min (A) (by C-G formula based on SCr of 1.25 mg/dL (H)). Liver Function Tests: Recent Labs  Lab 01/11/21 2336 01/12/21 0742 01/13/21 0548 01/14/21 0255 01/15/21 0515  AST 330* 176* 94* 44* 23  ALT 425* 272* 194* 148* 102*  ALKPHOS 102 81 76 81 76  BILITOT 1.3* 0.9 0.8 1.0 0.9  PROT 5.8* 5.3* 5.5* 5.4* 5.5*  ALBUMIN 3.1* 2.9* 3.1* 3.0* 3.0*   Recent Labs  Lab 01/11/21 2336  LIPASE 39   No results for input(s): AMMONIA in the last 168 hours. Coagulation Profile: Recent Labs  Lab 01/11/21 2336  INR 1.2   Cardiac Enzymes: No results for input(s): CKTOTAL, CKMB, CKMBINDEX, TROPONINI in the last 168 hours. BNP (last 3 results) No  results for input(s): PROBNP in the last 8760 hours. HbA1C: No results for input(s): HGBA1C in the last 72 hours. CBG: Recent Labs  Lab 01/15/21 2047 01/15/21 2350 01/16/21 0412 01/16/21 0759 01/16/21 1155  GLUCAP 163* 108* 104* 112* 120*   Lipid Profile: No results for input(s): CHOL, HDL, LDLCALC, TRIG, CHOLHDL, LDLDIRECT in the last 72 hours. Thyroid Function Tests: No results for input(s): TSH, T4TOTAL, FREET4, T3FREE, THYROIDAB in the last 72 hours. Anemia Panel: No results for input(s): VITAMINB12, FOLATE, FERRITIN, TIBC, IRON, RETICCTPCT in the last 72 hours. Sepsis Labs: Recent Labs  Lab 01/11/21 2336 01/12/21 0136 01/12/21 0742 01/13/21 0254 01/14/21 0255  PROCALCITON  --   --  53.72 47.20 28.07  LATICACIDVEN 2.6* 1.0  --   --   --     Recent Results (from the past 240 hour(s))  Resp Panel by RT-PCR (Flu A&B, Covid) Nasopharyngeal Swab     Status: None   Collection Time: 01/11/21 11:36 PM   Specimen: Nasopharyngeal Swab; Nasopharyngeal(NP) swabs in vial transport medium  Result Value Ref Range Status   SARS Coronavirus 2 by RT PCR NEGATIVE NEGATIVE Final    Comment: (NOTE) SARS-CoV-2 target nucleic acids are NOT DETECTED.  The SARS-CoV-2 RNA is generally detectable in upper respiratory specimens during the acute phase of infection. The lowest concentration of SARS-CoV-2 viral copies this assay can detect is 138 copies/mL. A negative result does not preclude SARS-Cov-2 infection and should not be used as the sole basis for treatment or other patient management decisions. A negative result may occur with  improper specimen collection/handling, submission of specimen other than nasopharyngeal swab, presence of viral mutation(s) within the areas targeted by this assay, and inadequate number of viral copies(<138 copies/mL). A negative result must be combined with clinical observations, patient history, and epidemiological information. The expected result is  Negative.  Fact Sheet for Patients:  EntrepreneurPulse.com.au  Fact Sheet for Healthcare Providers:  IncredibleEmployment.be  This test is no t yet approved or cleared by the [place] FDA and  has been authorized for detection and/or  diagnosis of SARS-CoV-2 by FDA under an Emergency Use Authorization (EUA). This EUA will remain  in effect (meaning this test can be used) for the duration of the COVID-19 declaration under Section 564(b)(1) of the Act, 21 U.S.C.section 360bbb-3(b)(1), unless the authorization is terminated  or revoked sooner.       Influenza A by PCR NEGATIVE NEGATIVE Final   Influenza B by PCR NEGATIVE NEGATIVE Final    Comment: (NOTE) The Xpert Xpress SARS-CoV-2/FLU/RSV plus assay is intended as an aid in the diagnosis of influenza from Nasopharyngeal swab specimens and should not be used as a sole basis for treatment. Nasal washings and aspirates are unacceptable for Xpert Xpress SARS-CoV-2/FLU/RSV testing.  Fact Sheet for Patients: EntrepreneurPulse.com.au  Fact Sheet for Healthcare Providers: IncredibleEmployment.be  This test is not yet approved or cleared by the [place] FDA and has been authorized for detection and/or diagnosis of SARS-CoV-2 by FDA under an Emergency Use Authorization (EUA). This EUA will remain in effect (meaning this test can be used) for the duration of the COVID-19 declaration under Section 564(b)(1) of the Act, 21 U.S.C. section 360bbb-3(b)(1), unless the authorization is terminated or revoked.  Performed at [facility], [place] [address] [place], [place] [address]   Blood Culture (routine x 2)     Status: None (Preliminary result)   Collection Time: 01/11/21 11:36 PM   Specimen: BLOOD  Result Value Ref Range Status   Specimen Description   Final    BLOOD BLOOD LEFT HAND Performed at [place]  [address], [place], [place] [address]    Special Requests   Final    BOTTLES DRAWN AEROBIC ONLY Blood Culture results may not be optimal due to an inadequate volume of blood received in culture bottles Performed at [place] [facility] [address], [place], [place] [address]    Culture  Setup Time   Final    GRAM NEGATIVE RODS AEROBIC BOTTLE ONLY CRITICAL VALUE NOTED.  VALUE IS CONSISTENT WITH PREVIOUSLY REPORTED AND CALLED VALUE.    Culture   Final    GRAM NEGATIVE RODS IDENTIFICATION TO FOLLOW KLEBSIELLA OXYTOCA SUSCEPTIBILITIES PERFORMED ON PREVIOUS CULTURE WITHIN THE LAST 5 DAYS. Performed at [facility], [place] [address], [place], [place] [address]    Report Status PENDING  Incomplete  Blood Culture (routine x 2)     Status: Abnormal   Collection Time: 01/11/21 11:41 PM   Specimen: BLOOD  Result Value Ref Range Status   Specimen Description   Final    BLOOD LEFT ANTECUBITAL Performed at [facility] [address] [place], [place] [address]    Special Requests   Final    BOTTLES DRAWN AEROBIC AND ANAEROBIC Blood Culture adequate volume Performed at [facility] [place] [address], [place], [place] [address]    Culture  Setup Time   Final    GRAM NEGATIVE RODS GRAM POSITIVE COCCI IN BOTH AEROBIC AND ANAEROBIC BOTTLES CRITICAL RESULT CALLED TO, READ BACK BY AND VERIFIED WITH: TERRY GREEN PHARMD @1712  01/12/21 EB Performed at [facility], [place] [address] [place], [place] [address]    Culture (A)  Final    CITROBACTER BRAAKII KLEBSIELLA OXYTOCA ENTEROCOCCUS CASSELIFLAVUS    Report Status 01/16/2021 FINAL  Final   Organism ID, Bacteria CITROBACTER BRAAKII  Final   Organism ID, Bacteria KLEBSIELLA OXYTOCA  Final   Organism ID, Bacteria ENTEROCOCCUS CASSELIFLAVUS  Final      Susceptibility  Citrobacter braakii - MIC*    CEFAZOLIN >=64 RESISTANT Resistant     CEFEPIME <=0.12 SENSITIVE Sensitive      CEFTAZIDIME <=1 SENSITIVE Sensitive     CEFTRIAXONE <=0.25 SENSITIVE Sensitive     CIPROFLOXACIN <=0.25 SENSITIVE Sensitive     GENTAMICIN <=1 SENSITIVE Sensitive     IMIPENEM 0.5 SENSITIVE Sensitive     TRIMETH/SULFA <=20 SENSITIVE Sensitive     PIP/TAZO <=4 SENSITIVE Sensitive     * CITROBACTER BRAAKII   Enterococcus casseliflavus - MIC*    AMPICILLIN <=2 SENSITIVE Sensitive     VANCOMYCIN RESISTANT Resistant     GENTAMICIN SYNERGY SENSITIVE Sensitive     LINEZOLID 1 SENSITIVE Sensitive     * ENTEROCOCCUS CASSELIFLAVUS   Klebsiella oxytoca - MIC*    AMPICILLIN >=32 RESISTANT Resistant     CEFAZOLIN >=64 RESISTANT Resistant     CEFEPIME <=0.12 SENSITIVE Sensitive     CEFTAZIDIME <=1 SENSITIVE Sensitive     CEFTRIAXONE <=0.25 SENSITIVE Sensitive     CIPROFLOXACIN <=0.25 SENSITIVE Sensitive     GENTAMICIN <=1 SENSITIVE Sensitive     IMIPENEM <=0.25 SENSITIVE Sensitive     TRIMETH/SULFA <=20 SENSITIVE Sensitive     AMPICILLIN/SULBACTAM 8 SENSITIVE Sensitive     PIP/TAZO <=4 SENSITIVE Sensitive     * KLEBSIELLA OXYTOCA  Blood Culture ID Panel (Reflexed)     Status: Abnormal   Collection Time: 01/11/21 11:41 PM  Result Value Ref Range Status   Enterococcus faecalis NOT DETECTED NOT DETECTED Final   Enterococcus Faecium NOT DETECTED NOT DETECTED Final   Listeria monocytogenes NOT DETECTED NOT DETECTED Final   Staphylococcus species NOT DETECTED NOT DETECTED Final   Staphylococcus aureus (BCID) NOT DETECTED NOT DETECTED Final   Staphylococcus epidermidis NOT DETECTED NOT DETECTED Final   Staphylococcus lugdunensis NOT DETECTED NOT DETECTED Final   Streptococcus species NOT DETECTED NOT DETECTED Final   Streptococcus agalactiae NOT DETECTED NOT DETECTED Final   Streptococcus pneumoniae NOT DETECTED NOT DETECTED Final   Streptococcus pyogenes NOT DETECTED NOT DETECTED Final   A.calcoaceticus-baumannii NOT DETECTED NOT DETECTED Final   Bacteroides fragilis NOT DETECTED NOT DETECTED  Final   Enterobacterales DETECTED (A) NOT DETECTED Final    Comment: CRITICAL RESULT CALLED TO, READ BACK BY AND VERIFIED WITH: TERRY GREEN PHARMD @1712  01/12/21 EB    Enterobacter cloacae complex NOT DETECTED NOT DETECTED Final   Escherichia coli DETECTED (A) NOT DETECTED Final    Comment: CRITICAL RESULT CALLED TO, READ BACK BY AND VERIFIED WITH: TERRY GREEN PHARMD @1712  01/12/21 EB    Klebsiella aerogenes NOT DETECTED NOT DETECTED Final   Klebsiella oxytoca DETECTED (A) NOT DETECTED Final    Comment: CRITICAL RESULT CALLED TO, READ BACK BY AND VERIFIED WITH: TERRY GREEN PHARMD @1712  01/12/21 EB    Klebsiella pneumoniae NOT DETECTED NOT DETECTED Final   Proteus species NOT DETECTED NOT DETECTED Final   Salmonella species NOT DETECTED NOT DETECTED Final   Serratia marcescens NOT DETECTED NOT DETECTED Final   Haemophilus influenzae NOT DETECTED NOT DETECTED Final   Neisseria meningitidis NOT DETECTED NOT DETECTED Final   Pseudomonas aeruginosa NOT DETECTED NOT DETECTED Final   Stenotrophomonas maltophilia NOT DETECTED NOT DETECTED Final   Candida albicans NOT DETECTED NOT DETECTED Final   Candida auris NOT DETECTED NOT DETECTED Final   Candida glabrata NOT DETECTED NOT DETECTED Final   Candida krusei NOT DETECTED NOT DETECTED Final   Candida parapsilosis NOT DETECTED NOT DETECTED Final   Candida tropicalis NOT DETECTED  NOT DETECTED Final   Cryptococcus neoformans/gattii NOT DETECTED NOT DETECTED Final   CTX-M ESBL NOT DETECTED NOT DETECTED Final   Carbapenem resistance IMP NOT DETECTED NOT DETECTED Final   Carbapenem resistance KPC NOT DETECTED NOT DETECTED Final   Carbapenem resistance NDM NOT DETECTED NOT DETECTED Final   Carbapenem resist OXA 48 LIKE NOT DETECTED NOT DETECTED Final   Carbapenem resistance VIM NOT DETECTED NOT DETECTED Final    Comment: Performed at [facility], [place] [address], [place], [place] [address]  Urine culture     Status: None   Collection Time:  01/12/21  4:41 AM   Specimen: In/Out Cath Urine  Result Value Ref Range Status   Specimen Description   Final    IN/OUT CATH URINE Performed at [facility] [address] [place], [place] [address]    Special Requests   Final    NONE Performed at [facility], [place], [place] [address], [place], [place] [address]    Culture   Final    NO GROWTH Performed at [facility], [place] [address] [place], [place] [address]    Report Status 01/13/2021 FINAL  Final  MRSA PCR Screening     Status: None   Collection Time: 01/12/21  6:04 AM   Specimen: Nasal Mucosa; Nasopharyngeal  Result Value Ref Range Status   MRSA by PCR NEGATIVE NEGATIVE Final    Comment:        The GeneXpert MRSA Assay (FDA approved for NASAL specimens only), is one component of a comprehensive MRSA colonization surveillance program. It is not intended to diagnose MRSA infection nor to guide or monitor treatment for MRSA infections. Performed at [facility], [place] [address], [place], [place] [address]   Culture, blood (routine x 2)     Status: None (Preliminary result)   Collection Time: 01/14/21 11:03 AM   Specimen: BLOOD  Result Value Ref Range Status   Specimen Description   Final    BLOOD BLOOD LEFT HAND Performed at [facility] [place] [address] [place], [place] [address]    Special Requests   Final    BOTTLES DRAWN AEROBIC ONLY Blood Culture adequate volume Performed at [place] [address] [place], [place] [address]    Culture   Final    NO GROWTH 2 DAYS Performed at [facility] [address] [place], [place] [address]    Report Status PENDING  Incomplete  Culture, blood (routine x 2)     Status: None (Preliminary result)   Collection Time: 01/14/21 11:03 AM   Specimen: BLOOD  Result Value Ref Range Status   Specimen Description   Final    BLOOD BLOOD LEFT HAND Performed at [facility] [address], [place], [place] [address]    Special Requests   Final    BOTTLES DRAWN AEROBIC ONLY Blood Culture adequate volume Performed at [place] [address] [place], [place] [address]    Culture   Final    NO GROWTH 2 DAYS Performed at [facility] [address], [place], [place] [address]    Report Status PENDING  Incomplete      Radiology Studies: No results found.    Scheduled Meds: . Chlorhexidine Gluconate Cloth  6 each Topical Daily  . cholestyramine light  4 g Oral Q24H  . [START ON 01/17/2021] ciprofloxacin  500 mg Oral BID  .  gabapentin  600 mg Oral QHS   And  . gabapentin  300 mg Oral Daily  . heparin  5,000 Units Subcutaneous Q8H  . hydrALAZINE  10 mg Oral QHS  . insulin aspart  0-9 Units Subcutaneous Q4H  . mouth rinse  15 mL Mouth Rinse BID  . pantoprazole  40 mg Oral QAC breakfast  . potassium phosphate (monobasic)  500 mg Oral BID WC  . sodium chloride flush  10 mL Intravenous Q12H  . venlafaxine XR  75 mg Oral q morning   Continuous Infusions: . dextrose 20 mL/hr at 01/15/21 0420  . piperacillin-tazobactam (ZOSYN)  IV 3.375 g (01/16/21 1340)     LOS: 4 days      Time spent: 30 minutes   Dessa Phi, DO [facility] 01/16/2021, 3:16 PM   Available via Epic secure chat 7am-7pm After these hours, please refer to coverage provider listed on [email or web address]

## 2021-01-16 NOTE — Progress Notes (Signed)
[facility]  Date of Admission:  01/11/2021           Reason for visit: Follow up on ascending cholangitis  Current antibiotics: Piperacillin tazobactam 4/18--present  Previous antibiotics: Cefepime 4/14 Vancomycin 4/14 Meropenem 4/14--4/18  ASSESSMENT:    1. Polymicrobial bacteremia: Blood cultures 4/14 with Citrobacter braakii, Klebsiella oxytoca, Enterococcus casseliflavus, and yet to be identified GNR which the lab reports is E. coli.  Discussed with microbiology today and they report the Enterococcus is sensitive to fluoroquinolones.  Anticipate susceptibilities in the next 48 to 72 hours of the yet to be identified E. coli, however, anticipate that this will also be Cipro sensitive.  The source of her bacteremia is secondary to ascending cholangitis after she presented with severe sepsis with right upper quadrant pain, fevers, encephalopathy, and CT findings of calculi in the central biliary tree on the left with intrahepatic biliary dilation as well as elevated LFTs in a hepatocellular pattern.  Status post ERCP on 01/12/2021 with stone removal.  Clinically she has continued to respond very well to antibiotics and source control.  She currently remains afebrile with normal WBC and improving LFTs as of yesterday.  She has received approximately 5 days of adequate IV antibiotic coverage including anaerobic coverage since her ERCP and should be stable for transition to oral therapy at this time. 2. Penicillin allergy: She has tolerated piperacillin tazobactam and we will remove this from her allergy list.   3. Acute kidney injury: Improving with creatinine 1.2 this morning  PLAN:    . Discussed with patient and her husband.  Will transition to oral therapy tomorrow in anticipation of discharge.  Continue piperacillin tazobactam for today and starting tomorrow morning start ciprofloxacin 500 mg p.o. twice daily.  Discussed continuing anaerobic coverage,  however, patient with allergy to metronidazole.  I feel comfortable not continuing metronidazole as no anaerobes have been isolated from her cultures and she has received 5 days of anaerobic coverage to date. . Will follow-up susceptibilities of the pending GNR to ensure it is susceptible to ciprofloxacin and adjust antibiotics as an outpatient as needed . Continue ciprofloxacin to treat for 10 days total from her ERCP with end date 01/22/2021 . Outpatient follow-up scheduled with myself on 01/25/2021 . Will sign off for now, please call as needed   Principal Problem:   Bacteremia Active Problems:   Essential hypertension   Diabetes mellitus type 2 in obese ([place])   Chronic kidney disease, stage 3 (HCC)   Ascending cholangitis   Sepsis with acute hypoxic respiratory failure and septic shock (HCC)   Acute encephalopathy   Thrombocytopenia (HCC)   Acute respiratory failure with hypoxia (HCC)    MEDICATIONS:    Scheduled Meds: . Chlorhexidine Gluconate Cloth  6 each Topical Daily  . cholestyramine light  4 g Oral Q24H  . gabapentin  600 mg Oral QHS   And  . gabapentin  300 mg Oral Daily  . heparin  5,000 Units Subcutaneous Q8H  . hydrALAZINE  10 mg Oral QHS  . insulin aspart  0-9 Units Subcutaneous Q4H  . mouth rinse  15 mL Mouth Rinse BID  . pantoprazole  40 mg Oral QAC breakfast  . potassium phosphate (monobasic)  500 mg Oral BID WC  . sodium chloride flush  10 mL Intravenous Q12H  . venlafaxine XR  75 mg Oral q morning   Continuous Infusions: . dextrose 20 mL/hr at 01/15/21 0420  . piperacillin-tazobactam (ZOSYN)  IV 3.375 g (01/16/21 1340)   PRN Meds:.acetaminophen, ondansetron **OR** ondansetron (ZOFRAN) IV  SUBJECTIVE:   24 hour events:  No acute events noted Patient tolerated piperacillin tazobactam without issues  She continues to do well, she denies fevers or chills.  Tolerating oral intake.  Discussed discharge for tonight versus tomorrow and she prefers to stay  overnight for an additional day of IV antibiotics which I think is appropriate.     OBJECTIVE:   Blood pressure 118/67, pulse 67, temperature 97.9 F (36.6 C), temperature source Oral, resp. rate 16, height 5\' 3"  (1.6 m), weight 85.1 kg, SpO2 92 %. Body mass index is 33.23 kg/m.  Physical Exam Constitutional:      General: She is not in acute distress.    Appearance: Normal appearance.     Comments: She is sitting up in the chair, pleasant, conversant, no acute distress  HENT:     Head: Normocephalic and atraumatic.  Eyes:     General: No scleral icterus.    Extraocular Movements: Extraocular movements intact.     Conjunctiva/sclera: Conjunctivae normal.  Pulmonary:     Effort: Pulmonary effort is normal. No respiratory distress.  Skin:    General: Skin is warm and dry.     Findings: No rash.  Neurological:     General: No focal deficit present.     Mental Status: She is alert and oriented to person, place, and time.  Psychiatric:        Mood and Affect: Mood normal.        Behavior: Behavior normal.      Lab Results: Lab Results  Component Value Date   WBC 5.8 01/16/2021   HGB 10.5 (L) 01/16/2021   HCT 31.6 (L) 01/16/2021   MCV 91.1 01/16/2021   PLT 133 (L) 01/16/2021    Lab Results  Component Value Date   NA 142 01/16/2021   K 3.9 01/16/2021   CO2 20 (L) 01/16/2021   GLUCOSE 114 (H) 01/16/2021   BUN 25 (H) 01/16/2021   CREATININE 1.25 (H) 01/16/2021   CALCIUM 8.5 (L) 01/16/2021   GFRNONAA 45 (L) 01/16/2021   GFRAA >60 10/19/2018    Lab Results  Component Value Date   ALT 102 (H) 01/15/2021   AST 23 01/15/2021   ALKPHOS 76 01/15/2021   BILITOT 0.9 01/15/2021    No results found for: CRP  No results found for: ESRSEDRATE   I have reviewed the micro and lab results in Epic.  Imaging: No results found.   Imaging  independently reviewed in Epic.    Raynelle [facility] for Infectious [facility] [phone] pager 01/16/2021, 2:51 PM  I spent greater than 35 minutes with the patient including greater than 50% of time in face to face counsel of the patient and in coordination of their care.

## 2021-01-16 NOTE — Evaluation (Signed)
Occupational Therapy Evaluation Patient Details Name: Tina Briggs MRN: [id number] DOB: 27-Aug-1946 Today's Date: 01/16/2021    History of Present Illness 75 yo female admitted with cholangitis, septic shock, abd pain, bradycardia. Hx of CKD, DM, lumbar laminectomy   Clinical Impression   Tina Briggs is a 75 year old woman who presents with functional upper body strength, ability to perform ADLs and independently perform in room ambulation without DME. Patient reports feeling better than admission. Patient has been independently toileting in room and managing IV pole without nursing assistance. Patient demonstrates ability to don socks. Patient is modified independent with ADLs and mobility. No OT needs at this time.    Follow Up Recommendations  No OT follow up    Equipment Recommendations  None recommended by OT    Recommendations for Other Services       Precautions / Restrictions Precautions Precautions: None Restrictions Weight Bearing Restrictions: No      Mobility Bed Mobility Overal bed mobility: Modified Independent                  Transfers Overall transfer level: Modified independent               General transfer comment: Patient standing in room when therapist entered. Able to ambulate in room x 2 laps managing IV pole and without physical assistance.    Balance Overall balance assessment: No apparent balance deficits (not formally assessed)                                         ADL either performed or assessed with clinical judgement   ADL Overall ADL's : Modified independent                                       General ADL Comments: Able to don socks, is toileting independently. Increased time to manage IV pole and line.     Vision Patient Visual Report: No change from baseline       Perception     Praxis      Pertinent Vitals/Pain Pain Assessment: No/denies pain      Hand Dominance Right   Extremity/Trunk Assessment Upper Extremity Assessment Upper Extremity Assessment: Overall WFL for tasks assessed   Lower Extremity Assessment Lower Extremity Assessment: Defer to PT evaluation   Cervical / Trunk Assessment Cervical / Trunk Assessment: Normal   Communication Communication Communication: No difficulties   Cognition Arousal/Alertness: Awake/alert Behavior During Therapy: WFL for tasks assessed/performed Overall Cognitive Status: Within Functional Limits for tasks assessed                                     General Comments       Exercises     Shoulder Instructions      Home Living Family/patient expects to be discharged to:: Private residence Living Arrangements: Spouse/significant other Available Help at Discharge: Family Type of Home: House Home Access: Level entry     Home Layout: One level     Bathroom Shower/Tub: Walk-in shower         Home Equipment: Shower seat          Prior Functioning/Environment Level of Independence: Independent  Comments: history of falls        OT Problem List:        OT Treatment/Interventions:      OT Goals(Current goals can be found in the care plan section) Acute Rehab OT Goals OT Goal Formulation: All assessment and education complete, DC therapy  OT Frequency:     Barriers to D/C:            Co-evaluation              AM-PAC OT "6 Clicks" Daily Activity     Outcome Measure Help from another person eating meals?: None Help from another person taking care of personal grooming?: None Help from another person toileting, which includes using toliet, bedpan, or urinal?: None Help from another person bathing (including washing, rinsing, drying)?: None Help from another person to put on and taking off regular upper body clothing?: None Help from another person to put on and taking off regular lower body clothing?: None 6 Click Score:  24   End of Session Nurse Communication: Mobility status  Activity Tolerance: Patient tolerated treatment well Patient left: in chair;with call bell/phone within reach  OT Visit Diagnosis: Muscle weakness (generalized) (M62.81)                Time: 7493-5521 OT Time Calculation (min): 6 min Charges:  OT General Charges $OT Visit: 1 Visit OT Evaluation $OT Eval Low Complexity: 1 Low  Kristofor Michalowski, OTR/L [place]  Office [phone] Pager: [place] 01/16/2021, 9:02 AM

## 2021-01-16 NOTE — Progress Notes (Signed)
Physical Therapy Treatment Patient Details Name: Tina Briggs MRN: [id number] DOB: [birth year] Today's Date: 01/16/2021    History of Present Illness 75 yo female admitted with cholangitis, septic shock, abd pain, bradycardia. Hx of CKD, DM, lumbar laminectomy    PT Comments    Progressing with mobility and pt reports feeling better today. LOB x 2 on today. Pt reports recent history of neuropathy in L foot-schedule to have a MRI to assess. Continue to recommend OP PT for balance training if pt is agreeable.     Follow Up Recommendations  Outpatient PT (for balance training-if pt is agreeable)     Equipment Recommendations  None recommended by PT    Recommendations for Other Services       Precautions / Restrictions Precautions Precautions: Fall Restrictions Weight Bearing Restrictions: No    Mobility  Bed Mobility               General bed mobility comments: oob in recliner    Transfers Overall transfer level: Modified independent   Transfers: Sit to/from Stand Sit to Stand: Modified independent (Device/Increase time)            Ambulation/Gait Ambulation/Gait assistance: Min guard Gait Distance (Feet): 350 Feet Assistive device: None Gait Pattern/deviations: Step-through pattern;Decreased stride length     General Gait Details: LOB x 2 to L side with pt having to reach out for wall to prevent fall both times. Slow, guarded gait 2* instability/impaired dynamic balance. Dyspnea 2/4-1 standing rest break needed/taken after ~175 feet.   Stairs             Wheelchair Mobility    Modified Rankin (Stroke Patients Only)       Balance Overall balance assessment: Needs assistance           Standing balance-Leahy Scale: Fair               High level balance activites: Side stepping;Backward walking High Level Balance Comments: side steps ~20 feet, backwards walking ~20 feet-both with no UE support and close min guarding. tandem  walking ~20 feet-with 1 UE support of handrail            Cognition Arousal/Alertness: Awake/alert Behavior During Therapy: WFL for tasks assessed/performed Overall Cognitive Status: Within Functional Limits for tasks assessed                                        Exercises      General Comments        Pertinent Vitals/Pain Pain Assessment: No/denies pain    Home Living                      Prior Function            PT Goals (current goals can now be found in the care plan section) Progress towards PT goals: Progressing toward goals    Frequency    Min 3X/week      PT Plan      Co-evaluation              AM-PAC PT "6 Clicks" Mobility   Outcome Measure  Help needed turning from your back to your side while in a flat bed without using bedrails?: A Little Help needed moving from lying on your back to sitting on the side of a flat bed without using bedrails?: A Little Help  needed moving to and from a bed to a chair (including a wheelchair)?: A Little Help needed standing up from a chair using your arms (e.g., wheelchair or bedside chair)?: A Little Help needed to walk in hospital room?: A Little Help needed climbing 3-5 steps with a railing? : A Little 6 Click Score: 18    End of Session Equipment Utilized During Treatment: Gait belt Activity Tolerance: Patient tolerated treatment well Patient left: in chair;with call bell/phone within reach;with family/visitor present   PT Visit Diagnosis: History of falling (Z91.81);Unsteadiness on feet (R26.81);Muscle weakness (generalized) (M62.81)     Time: 5188-4166 PT Time Calculation (min) (ACUTE ONLY): 12 min  Charges:  $Gait Training: 8-22 mins                         Doreatha Massed, PT Acute Rehabilitation  Office: [phone] Pager: [phone]

## 2021-01-16 NOTE — Progress Notes (Signed)
Ref: Wenda Low, MD   Subjective:  Feeling better. VS stable. Further improvement in LFT and Creatinine.  Objective:  Vital Signs in the last 24 hours: Temp:  [99.1 F (37.3 C)-99.6 F (37.6 C)] 99.1 F (37.3 C) (04/19 0416) Pulse Rate:  [59-67] 59 (04/19 0416) Cardiac Rhythm: Normal sinus rhythm (04/18 2100) Resp:  [16-18] 17 (04/19 0416) BP: (99-126)/(56-67) 99/56 (04/19 0416) SpO2:  [92 %-94 %] 92 % (04/19 0416)  Physical Exam: BP Readings from Last 1 Encounters:  01/16/21 (!) 99/56     Wt Readings from Last 1 Encounters:  01/12/21 85.1 kg    Weight change:  Body mass index is 33.23 kg/m. HEENT: [place]/AT, Eyes-Green, Conjunctiva-Pale pink, Sclera-Non-icteric Neck: No JVD, No bruit, Trachea midline. Lungs:  Clear, Bilateral. Cardiac:  Regular rhythm, normal S1 and S2, no S3. II/VI systolic murmur. Abdomen:  Soft, non-tender. BS present. Extremities:  No edema present. No cyanosis. No clubbing. CNS: AxOx3, Cranial nerves grossly intact, moves all 4 extremities.  Skin: Warm and dry.   Intake/Output from previous day: 04/18 0701 - 04/19 0700 In: 343.8 [P.O.:240; I.V.:60.7; IV Piggyback:43] Out: 1300 [Urine:1300]    Lab Results: BMET    Component Value Date/Time   NA 142 01/16/2021 0619   NA 144 01/15/2021 0515   NA 141 01/14/2021 0255   K 3.9 01/16/2021 0619   K 3.9 01/15/2021 0515   K 3.5 01/14/2021 0255   CL 116 (H) 01/16/2021 0619   CL 114 (H) 01/15/2021 0515   CL 114 (H) 01/14/2021 0255   CO2 20 (L) 01/16/2021 0619   CO2 21 (L) 01/15/2021 0515   CO2 18 (L) 01/14/2021 0255   GLUCOSE 114 (H) 01/16/2021 0619   GLUCOSE 100 (H) 01/15/2021 0515   GLUCOSE 119 (H) 01/14/2021 0255   BUN 25 (H) 01/16/2021 0619   BUN 34 (H) 01/15/2021 0515   BUN 46 (H) 01/14/2021 0255   CREATININE 1.25 (H) 01/16/2021 0619   CREATININE 1.37 (H) 01/15/2021 0515   CREATININE 1.63 (H) 01/14/2021 0255   CALCIUM 8.5 (L) 01/16/2021 0619   CALCIUM 8.3 (L) 01/15/2021 0515    CALCIUM 8.1 (L) 01/14/2021 0255   GFRNONAA 45 (L) 01/16/2021 0619   GFRNONAA 41 (L) 01/15/2021 0515   GFRNONAA 33 (L) 01/14/2021 0255   GFRAA >60 10/19/2018 0520   GFRAA 59 (L) 10/18/2018 0554   GFRAA 56 (L) 10/17/2018 0511   CBC    Component Value Date/Time   WBC 5.8 01/16/2021 0619   RBC 3.47 (L) 01/16/2021 0619   HGB 10.5 (L) 01/16/2021 0619   HGB 11.7 08/26/2017 1255   HCT 31.6 (L) 01/16/2021 0619   HCT 34.7 (L) 08/26/2017 1255   PLT 133 (L) 01/16/2021 0619   PLT 180 08/26/2017 1255   MCV 91.1 01/16/2021 0619   MCV 87.2 08/26/2017 1255   MCH 30.3 01/16/2021 0619   MCHC 33.2 01/16/2021 0619   RDW 13.9 01/16/2021 0619   RDW 14.8 (H) 08/26/2017 1255   LYMPHSABS 1.3 01/16/2021 0619   LYMPHSABS 1.8 08/26/2017 1255   MONOABS 0.7 01/16/2021 0619   MONOABS 0.4 08/26/2017 1255   EOSABS 0.4 01/16/2021 0619   EOSABS 0.2 08/26/2017 1255   BASOSABS 0.0 01/16/2021 0619   BASOSABS 0.0 08/26/2017 1255   HEPATIC Function Panel Recent Labs    01/13/21 0548 01/14/21 0255 01/15/21 0515  PROT 5.5* 5.4* 5.5*   HEMOGLOBIN A1C No components found for: HGA1C,  MPG CARDIAC ENZYMES No results found for: CKTOTAL, CKMB, CKMBINDEX,  TROPONINI BNP No results for input(s): PROBNP in the last 8760 hours. TSH No results for input(s): TSH in the last 8760 hours. CHOLESTEROL No results for input(s): CHOL in the last 8760 hours.  Scheduled Meds: . Chlorhexidine Gluconate Cloth  6 each Topical Daily  . cholestyramine light  4 g Oral Q24H  . gabapentin  600 mg Oral QHS   And  . gabapentin  300 mg Oral Daily  . heparin  5,000 Units Subcutaneous Q8H  . hydrALAZINE  10 mg Oral QHS  . insulin aspart  0-9 Units Subcutaneous Q4H  . mouth rinse  15 mL Mouth Rinse BID  . pantoprazole  40 mg Oral QAC breakfast  . potassium phosphate (monobasic)  500 mg Oral BID WC  . sodium chloride flush  10 mL Intravenous Q12H  . venlafaxine XR  75 mg Oral q morning   Continuous Infusions: . dextrose 20  mL/hr at 01/15/21 0420  . piperacillin-tazobactam (ZOSYN)  IV 3.375 g (01/16/21 0534)   PRN Meds:.acetaminophen, ondansetron **OR** ondansetron (ZOFRAN) IV  Assessment/Plan: Sinus bradycardia, resolved Atypical chest pain, resolved Abdominal pain S/P ERCP S/P choledocholithiasis with extraction Type 2 DM H/O HTN  Continue medical treatment. Increase activity as tolerated.   LOS: 4 days   Time spent including chart review, lab review, examination, discussion with patient/Family/Nurse : 30 min   Dixie Dials  MD  01/16/2021, 9:15 AM

## 2021-01-17 LAB — BASIC METABOLIC PANEL
Anion gap: 8 (ref 5–15)
BUN: 27 mg/dL — ABNORMAL HIGH (ref 8–23)
CO2: 22 mmol/L (ref 22–32)
Calcium: 8.7 mg/dL — ABNORMAL LOW (ref 8.9–10.3)
Chloride: 112 mmol/L — ABNORMAL HIGH (ref 98–111)
Creatinine, Ser: 1.23 mg/dL — ABNORMAL HIGH (ref 0.44–1.00)
GFR, Estimated: 46 mL/min — ABNORMAL LOW (ref >60)
Glucose, Bld: 123 mg/dL — ABNORMAL HIGH (ref 70–99)
Potassium: 4 mmol/L (ref 3.5–5.1)
Sodium: 142 mmol/L (ref 135–145)

## 2021-01-17 LAB — GLUCOSE, CAPILLARY: Glucose-Capillary: 122 mg/dL — ABNORMAL HIGH (ref 70–99)

## 2021-01-17 MED ORDER — CIPROFLOXACIN HCL 500 MG PO TABS: 500.0000 mg | ORAL_TABLET | Freq: Two times a day (BID) | ORAL | 0 refills | Status: AC

## 2021-01-17 MED ORDER — HYDRALAZINE HCL 10 MG PO TABS: 10.0000 mg | ORAL_TABLET | Freq: Every day | ORAL | 2 refills | Status: DC

## 2021-01-17 NOTE — Care Management Important Message (Signed)
Important Message  Patient Details IM Letter given to the Patient. Name: Tina Briggs MRN: [id number] Date of Birth: 11-12-1945   Medicare Important Message Given:  Yes     Kerin Salen 01/17/2021, 10:53 AM

## 2021-01-17 NOTE — Discharge Summary (Addendum)
Physician Discharge Summary  Tina Briggs QAS:[id number] DOB: 1946-08-05 DOA: 01/11/2021  PCP: Wenda Low, MD  Admit date: 01/11/2021 Discharge date: 01/17/2021  Admitted From: Home Disposition:  Home  Recommendations for Outpatient Follow-up:  1. Follow up with PCP in 1 week 2. Follow up with ID as scheduled 4/28 3. Follow-up with Dr. Doylene Canard as outpatient in 1 week 4. Repeat BMP to check Cr. AKI improving during hospitalization and Cozaar on hold. Can resume Cozaar if Cr back to normal.   Discharge Condition: Stable CODE STATUS: Full  Diet recommendation: Carb modified / low fat   Brief/Interim Summary: Tina Briggs a 75 y.o.femalewith medical history significant forchronic kidney disease, type 2 diabetes mellitus, hypertension, remote cholecystectomy, and choledocholithiasis in 2016 status post ERCP with sphincterotomy and stone extraction, now presenting to emergency department with right upper quadrant pain, fevers, nausea, vomiting, and confusion. The patient reports that she developed right upper quadrant pain and nausea 1 week ago which resolved before returning again and then becoming more constant and severe over the past 3 days. She has not eaten in the past day due to nausea and has developed progressive pain, fevers, lethargy, and confusion. She felt too weak to sit up last night and EMS was called. She was found to have temperature of 103.9 F.She was given 1 L of IV fluids by EMS prior to arrival in the ED. She denies any chest pain, cough, shortness of breath, or diarrhea.  In the ER patient was noted to have clinical findings concerning for ascending cholangitis. She was placed on broad-spectrum antibiotics, blood cultures were collected, fluids were administered, GI was informed, with plans for ERCP.   She underwent ERCP with removal of stone by Dr. Watt Climes 4/15. She was found to have polymicrobial bacteremia and ID consulted for antibiotic  recommendations. Her symptoms continued to improve and she was discharged in stable condition.   Discharge Diagnoses:  Principal Problem:   Bacteremia Active Problems:   Essential hypertension   Diabetes mellitus type 2 in obese (HCC)   Chronic kidney disease, stage 3 (HCC)   Ascending cholangitis   Sepsis with acute hypoxic respiratory failure and septic shock (HCC)   Acute encephalopathy   Thrombocytopenia (HCC)   Acute respiratory failure with hypoxia (HCC)   Septic shock secondary to acute cholangitis Polymicrobial bacteremia -Presented with RUQ pain, fevers, confusion, and found on CT to have calculi in central biliary tree on left with intrahepatic biliary dilation on left and elevated LFTs in hepatocellular pattern  -Status post ERCP with removal of stone by Dr. Watt Climes 4/15 -Weaned off Levophed -Infectious disease consulted -Merrem--> Zosyn --> Cipro 500mg  BID end date 4/25  -Follow up with Dr. Juleen China 4/28   AKI -Improving  Acute metabolic encephalopathy -Resolved  Hypertension -Continue hydralazine  Diabetes mellitus type 2 -Sliding scale insulin  Sinus bradycardia with pause -Resolved -Follow-up with Dr. Doylene Canard as outpatient in 1 week    Discharge Instructions  Discharge Instructions    Call MD for:  difficulty breathing, headache or visual disturbances   Complete by: As directed    Call MD for:  extreme fatigue   Complete by: As directed    Call MD for:  persistant dizziness or light-headedness   Complete by: As directed    Call MD for:  persistant nausea and vomiting   Complete by: As directed    Call MD for:  severe uncontrolled pain   Complete by: As directed    Call MD for:  temperature >100.4   Complete by: As directed    Discharge instructions   Complete by: As directed    You were cared for by a hospitalist during your hospital stay. If you have any questions about your discharge medications or the care you received while you were in  the hospital after you are discharged, you can call the unit and ask to speak with the hospitalist on call if the hospitalist that took care of you is not available. Once you are discharged, your primary care physician will handle any further medical issues. Please note that NO REFILLS for any discharge medications will be authorized once you are discharged, as it is imperative that you return to your primary care physician (or establish a relationship with a primary care physician if you do not have one) for your aftercare needs so that they can reassess your need for medications and monitor your lab values.   Increase activity slowly   Complete by: As directed      Allergies as of 01/17/2021      Reactions   Codeine Shortness Of Breath   Clavulanic Acid    Flagyl [metronidazole]    Other Other (See Comments)   Prevacid [lansoprazole]    Tetracyclines & Related Itching   Trulicity [dulaglutide]    Azithromycin Itching, Rash   Erythromycin Itching, Rash   Morphine And Related Itching, Rash   Septra [sulfamethoxazole-trimethoprim] Itching, Rash      Medication List    STOP taking these medications   ibuprofen 200 MG tablet Commonly known as: ADVIL   losartan 25 MG tablet Commonly known as: COZAAR     TAKE these medications   acetaminophen 500 MG tablet Commonly known as: TYLENOL Take 1,000 mg by mouth 2 (two) times daily as needed for moderate pain or headache.   aspirin EC 81 MG tablet Take 81 mg by mouth daily.   cholecalciferol 1000 units tablet Commonly known as: VITAMIN D Take 3,000 Units by mouth daily.   cholestyramine light 4 g packet Commonly known as: PREVALITE Take 4 g by mouth every other day.   ciprofloxacin 500 MG tablet Commonly known as: Cipro Take 1 tablet (500 mg total) by mouth 2 (two) times daily for 5 days.   Crestor 40 MG tablet Generic drug: rosuvastatin TAKE 1 TABLET AT BEDTIME What changed: how much to take   famotidine 40 MG  tablet Commonly known as: PEPCID Take 40 mg by mouth 2 (two) times daily as needed for heartburn or indigestion.   fluticasone 50 MCG/ACT nasal spray Commonly known as: FLONASE Place 2 sprays into both nostrils daily as needed for allergies.   gabapentin 300 MG capsule Commonly known as: NEURONTIN Take 300-600 mg by mouth See admin instructions. 300mg  in the morning 600mg  at night   hydrALAZINE 10 MG tablet Commonly known as: APRESOLINE Take 1 tablet (10 mg total) by mouth at bedtime.   Jardiance 25 MG Tabs tablet Generic drug: empagliflozin Take 25 mg by mouth daily.   NovoLOG FlexPen ReliOn 100 UNIT/ML FlexPen Generic drug: insulin aspart Inject 14-16 Units into the skin 3 (three) times daily as needed. BS   PRESERVISION AREDS 2 PO Take 1 tablet by mouth 2 (two) times daily.   venlafaxine XR 75 MG 24 hr capsule Commonly known as: EFFEXOR-XR Take 75 mg by mouth every morning.   vitamin B-12 1000 MCG tablet Commonly known as: CYANOCOBALAMIN Take 1,000 mcg by mouth 3 (three) times a week. Take on Sundays, Wednesdays  and Fridays.       Follow-up Information    Wenda Low, MD. Schedule an appointment as soon as possible for a visit in 1 week(s).   Specialty: Internal Medicine Why: Follow up with repeat BMP to check on your kidney function. Cozaar is on hold until your follow up with PCP.  Contact information: [address] [place] [address] [phone]        Dixie Dials, MD. Schedule an appointment as soon as possible for a visit in 1 week(s).   Specialty: Cardiology Contact information: [place] [address] [phone]        Mignon Pine, DO. Go on 01/25/2021.   Specialties: Infectious Diseases, Internal Medicine Contact information: [address]  [place] [address] [phone]              Allergies  Allergen Reactions  . Codeine Shortness Of Breath  . Clavulanic Acid    . Flagyl [Metronidazole]   . Other Other (See Comments)  . Prevacid [Lansoprazole]   . Tetracyclines & Related Itching  . Trulicity [Dulaglutide]   . Azithromycin Itching and Rash  . Erythromycin Itching and Rash  . Morphine And Related Itching and Rash  . Septra [Sulfamethoxazole-Trimethoprim] Itching and Rash      Procedures/Studies: CT ABDOMEN PELVIS WO CONTRAST  Result Date: 01/12/2021 CLINICAL DATA:  Dyspnea, fever, sepsis, glomerulonephritis EXAM: CT CHEST, ABDOMEN AND PELVIS WITHOUT CONTRAST TECHNIQUE: Multidetector CT imaging of the chest, abdomen and pelvis was performed following the standard protocol without IV contrast. COMPARISON:  05/26/2020 FINDINGS: CT CHEST FINDINGS Cardiovascular: Mild coronary artery calcification. Global cardiac size within normal limits. No pericardial effusion. The central pulmonary arteries are of normal caliber. Mild atherosclerotic calcification within the thoracic aorta. No aortic aneurysm. Mediastinum/Nodes: The visualized thyroid is unremarkable. No pathologic thoracic adenopathy. Moderate hiatal hernia. The esophagus demonstrates an air-fluid level which may relate to gastroesophageal reflux or esophageal dysmotility. Lungs/Pleura: Mild bibasilar atelectasis. Trace interstitial pulmonary edema with smooth interlobular septal thickening at the lung bases and apices. No confluent pulmonary infiltrate. No pneumothorax or pleural effusion. The central airways are widely patent. Musculoskeletal: No acute bone abnormality. No suspicious lytic or blastic bone lesion. CT ABDOMEN PELVIS FINDINGS Hepatobiliary: Status post cholecystectomy. Mild pneumobilia suggests prior sphincterotomy. There is, however, hyperdensity noted within the central biliary tree, better appreciated on coronal imaging representing a intraluminal calculus within the central left hepatic bile ducts measuring 10 mm x 18 mm. There is mild left intrahepatic biliary ductal dilation and fluid  opacification of these ducts in keeping with an obstructed biliary tree. There is persistent pneumobilia within the a right hepatic ducts indicating patency of this portion of the biliary tree. The liver is otherwise unremarkable. Pancreas: Unremarkable Spleen: Unremarkable Adrenals/Urinary Tract: The adrenal glands are unremarkable. The kidneys are normal in size and position. No intrarenal or ureteral calculi. No hydronephrosis. The bladder is mildly distended, but is otherwise unremarkable. Stomach/Bowel: Extensive descending and sigmoid colonic diverticulosis. No superimposed inflammatory change. The stomach, small bowel, and large bowel are otherwise unremarkable. Appendix absent. No free intraperitoneal fluid or gas. Vascular/Lymphatic: Mild atherosclerotic calcification within the thoracic aorta. No aortic aneurysm. No pathologic adenopathy within the abdomen and pelvis. Reproductive: Status post hysterectomy. No adnexal masses. Other: Tiny fat containing umbilical hernia.  Rectum unremarkable. Musculoskeletal: No lytic or blastic bone lesion. L4-5 fusion with instrumentation has been performed. No acute bone abnormality. IMPRESSION: Intraluminal calculi within the central left  biliary tree with distal fluid opacification and dilation of the intrahepatic bile ducts within the left hepatic lobe. This may manifest clinically as sepsis secondary to ascending cholangitis. Mild coronary artery calcification. Trace interstitial pulmonary edema. Extensive distal colonic diverticulosis Aortic Atherosclerosis (ICD10-I70.0). Electronically Signed   By: Fidela Salisbury MD   On: 01/12/2021 02:44   CT CHEST WO CONTRAST  Result Date: 01/12/2021 CLINICAL DATA:  Dyspnea, fever, sepsis, glomerulonephritis EXAM: CT CHEST, ABDOMEN AND PELVIS WITHOUT CONTRAST TECHNIQUE: Multidetector CT imaging of the chest, abdomen and pelvis was performed following the standard protocol without IV contrast. COMPARISON:  05/26/2020 FINDINGS:  CT CHEST FINDINGS Cardiovascular: Mild coronary artery calcification. Global cardiac size within normal limits. No pericardial effusion. The central pulmonary arteries are of normal caliber. Mild atherosclerotic calcification within the thoracic aorta. No aortic aneurysm. Mediastinum/Nodes: The visualized thyroid is unremarkable. No pathologic thoracic adenopathy. Moderate hiatal hernia. The esophagus demonstrates an air-fluid level which may relate to gastroesophageal reflux or esophageal dysmotility. Lungs/Pleura: Mild bibasilar atelectasis. Trace interstitial pulmonary edema with smooth interlobular septal thickening at the lung bases and apices. No confluent pulmonary infiltrate. No pneumothorax or pleural effusion. The central airways are widely patent. Musculoskeletal: No acute bone abnormality. No suspicious lytic or blastic bone lesion. CT ABDOMEN PELVIS FINDINGS Hepatobiliary: Status post cholecystectomy. Mild pneumobilia suggests prior sphincterotomy. There is, however, hyperdensity noted within the central biliary tree, better appreciated on coronal imaging representing a intraluminal calculus within the central left hepatic bile ducts measuring 10 mm x 18 mm. There is mild left intrahepatic biliary ductal dilation and fluid opacification of these ducts in keeping with an obstructed biliary tree. There is persistent pneumobilia within the a right hepatic ducts indicating patency of this portion of the biliary tree. The liver is otherwise unremarkable. Pancreas: Unremarkable Spleen: Unremarkable Adrenals/Urinary Tract: The adrenal glands are unremarkable. The kidneys are normal in size and position. No intrarenal or ureteral calculi. No hydronephrosis. The bladder is mildly distended, but is otherwise unremarkable. Stomach/Bowel: Extensive descending and sigmoid colonic diverticulosis. No superimposed inflammatory change. The stomach, small bowel, and large bowel are otherwise unremarkable. Appendix  absent. No free intraperitoneal fluid or gas. Vascular/Lymphatic: Mild atherosclerotic calcification within the thoracic aorta. No aortic aneurysm. No pathologic adenopathy within the abdomen and pelvis. Reproductive: Status post hysterectomy. No adnexal masses. Other: Tiny fat containing umbilical hernia.  Rectum unremarkable. Musculoskeletal: No lytic or blastic bone lesion. L4-5 fusion with instrumentation has been performed. No acute bone abnormality. IMPRESSION: Intraluminal calculi within the central left biliary tree with distal fluid opacification and dilation of the intrahepatic bile ducts within the left hepatic lobe. This may manifest clinically as sepsis secondary to ascending cholangitis. Mild coronary artery calcification. Trace interstitial pulmonary edema. Extensive distal colonic diverticulosis Aortic Atherosclerosis (ICD10-I70.0). Electronically Signed   By: Fidela Salisbury MD   On: 01/12/2021 02:44   DG Chest Port 1 View  Result Date: 01/12/2021 CLINICAL DATA:  Vomiting and fever EXAM: PORTABLE CHEST 1 VIEW COMPARISON:  12/25/2017 FINDINGS: The heart size and mediastinal contours are within normal limits. Both lungs are clear. The visualized skeletal structures are unremarkable. IMPRESSION: No active disease. Electronically Signed   By: Ulyses Jarred M.D.   On: 01/12/2021 00:17   DG ERCP BILIARY & PANCREATIC DUCTS  Result Date: 01/12/2021 CLINICAL DATA:  Choledocholithiasis EXAM: ERCP TECHNIQUE: Multiple spot images obtained with the fluoroscopic device and submitted for interpretation post-procedure. FLUOROSCOPY TIME:  Fluoroscopy Time:  5 minutes 32 seconds Radiation Exposure Index (if provided by the  fluoroscopic device): 124.42 mGy COMPARISON:  CT abdomen/pelvis 01/12/2021 FINDINGS: Five intraoperative saved images are submitted for review. The images demonstrate a flexible endoscope in the descending duodenum with wire cannulation of the right intrahepatic ducts. Cholangiogram  demonstrates mild biliary ductal dilatation. Filling defects in the distal common bile duct consistent with choledocholithiasis. Subsequent images demonstrate sphincterotomy and balloon sweep of the common duct. IMPRESSION: 1. Choledocholithiasis. 2. ERCP with sphincterotomy and balloon sweeping of the common duct. These images were submitted for radiologic interpretation only. Please see the procedural report for the amount of contrast and the fluoroscopy time utilized. Electronically Signed   By: Jacqulynn Cadet M.D.   On: 01/12/2021 10:49   ECHOCARDIOGRAM COMPLETE  Result Date: 01/12/2021    ECHOCARDIOGRAM REPORT   Patient Name:   SERA HITSMAN Date of Exam: 01/12/2021 Medical Rec #:  [id number]           Height:       63.0 in Accession #:    [id number]          Weight:       187.6 lb Date of Birth:  [birth year]          BSA:          1.882 m Patient Age:    53 years            BP:           124/57 mmHg Patient Gender: F                   HR:           56 bpm. Exam Location:  Inpatient Procedure: 2D Echo, Cardiac Doppler and Color Doppler Indications:    Dyspnea R06.00  History:        Patient has no prior history of Echocardiogram examinations.                 Risk Factors:Hypertension, Diabetes, Dyslipidemia and GERD.  Sonographer:    Jonelle Sidle Dance Referring Phys: [id number] [place]  1. Left ventricular ejection fraction, by estimation, is 60 to 65%. The left ventricle has normal function. The left ventricle has no regional wall motion abnormalities. Left ventricular diastolic parameters are consistent with Grade II diastolic dysfunction (pseudonormalization).  2. Right ventricular systolic function is normal. The right ventricular size is normal. There is normal pulmonary artery systolic pressure.  3. Left atrial size was moderately dilated.  4. The mitral valve is grossly normal. Trivial mitral valve regurgitation. Moderate mitral annular calcification.  5. Tricuspid valve  regurgitation is mild to moderate.  6. The aortic valve is tricuspid. There is mild calcification of the aortic valve. There is mild thickening of the aortic valve. Aortic valve regurgitation is not visualized. Mild aortic valve sclerosis is present, with no evidence of aortic valve stenosis.  7. The inferior vena cava is dilated in size with >50% respiratory variability, suggesting right atrial pressure of 8 mmHg. FINDINGS  Left Ventricle: Left ventricular ejection fraction, by estimation, is 60 to 65%. The left ventricle has normal function. The left ventricle has no regional wall motion abnormalities. The left ventricular internal cavity size was normal in size. There is  borderline left ventricular hypertrophy. Left ventricular diastolic parameters are consistent with Grade II diastolic dysfunction (pseudonormalization). Right Ventricle: The right ventricular size is normal. Right vetricular wall thickness was not well visualized. Right ventricular systolic function is normal. There is normal pulmonary artery systolic pressure. The tricuspid regurgitant velocity  is 2.31 m/s, and with an assumed right atrial pressure of 8 mmHg, the estimated right ventricular systolic pressure is 84.1 mmHg. Left Atrium: Left atrial size was moderately dilated. Right Atrium: Right atrial size was normal in size. Pericardium: There is no evidence of pericardial effusion. Mitral Valve: The mitral valve is grossly normal. There is mild thickening of the mitral valve leaflet(s). There is mild calcification of the mitral valve leaflet(s). Moderate mitral annular calcification. Trivial mitral valve regurgitation. Tricuspid Valve: The tricuspid valve is normal in structure. Tricuspid valve regurgitation is mild to moderate. Aortic Valve: Focal calcification of NCC. The aortic valve is tricuspid. There is mild calcification of the aortic valve. There is mild thickening of the aortic valve. There is mild aortic valve annular calcification.  Aortic valve regurgitation is not visualized. Mild aortic valve sclerosis is present, with no evidence of aortic valve stenosis. Pulmonic Valve: The pulmonic valve was grossly normal. Pulmonic valve regurgitation is mild. No evidence of pulmonic stenosis. Aorta: The aortic root, ascending aorta, aortic arch and descending aorta are all structurally normal, with no evidence of dilitation or obstruction. Venous: The inferior vena cava is dilated in size with greater than 50% respiratory variability, suggesting right atrial pressure of 8 mmHg. IAS/Shunts: The atrial septum is grossly normal.  LEFT VENTRICLE PLAX 2D LVIDd:         3.50 cm  Diastology LVIDs:         2.30 cm  LV e' medial:    7.94 cm/s LV PW:         1.20 cm  LV E/e' medial:  16.2 LV IVS:        1.10 cm  LV e' lateral:   8.92 cm/s LVOT diam:     1.90 cm  LV E/e' lateral: 14.5 LV SV:         58 LV SV Index:   31 LVOT Area:     2.84 cm  RIGHT VENTRICLE             IVC RV Basal diam:  2.30 cm     IVC diam: 2.50 cm RV S prime:     10.00 cm/s TAPSE (M-mode): 2.1 cm LEFT ATRIUM             Index       RIGHT ATRIUM           Index LA diam:        4.20 cm 2.23 cm/m  RA Area:     11.00 cm LA Vol (A2C):   83.9 ml 44.58 ml/m RA Volume:   22.80 ml  12.12 ml/m LA Vol (A4C):   64.6 ml 34.33 ml/m LA Biplane Vol: 73.8 ml 39.22 ml/m  AORTIC VALVE LVOT Vmax:   80.40 cm/s LVOT Vmean:  55.000 cm/s LVOT VTI:    0.206 m  AORTA Ao Root diam: 3.10 cm Ao Asc diam:  3.40 cm MITRAL VALVE                TRICUSPID VALVE MV Area (PHT): 3.91 cm     TR Peak grad:   21.3 mmHg MV Decel Time: 194 msec     TR Vmax:        231.00 cm/s MV E velocity: 129.00 cm/s MV A velocity: 73.10 cm/s   SHUNTS MV E/A ratio:  1.76         Systemic VTI:  0.21 m  Systemic Diam: 1.90 cm Buford Dresser MD Electronically signed by Buford Dresser MD Signature Date/Time: 01/12/2021/7:03:04 PM    Final        Discharge Exam: Vitals:   01/16/21 2022 01/17/21  0431  BP: (!) 152/68 (!) 131/57  Pulse: 60 62  Resp: 16 16  Temp: 98 F (36.7 C) 98.3 F (36.8 C)  SpO2: 97% 93%    General: Pt is alert, awake, not in acute distress Cardiovascular: RRR, S1/S2 +, no edema Respiratory: CTA bilaterally, no wheezing, no rhonchi, no respiratory distress, no conversational dyspnea  Abdominal: Soft, NT, ND, bowel sounds + Extremities: no edema, no cyanosis Psych: Normal mood and affect, stable judgement and insight     The results of significant diagnostics from this hospitalization (including imaging, microbiology, ancillary and laboratory) are listed below for reference.     Microbiology: Recent Results (from the past 240 hour(s))  Resp Panel by RT-PCR (Flu A&B, Covid) Nasopharyngeal Swab     Status: None   Collection Time: 01/11/21 11:36 PM   Specimen: Nasopharyngeal Swab; Nasopharyngeal(NP) swabs in vial transport medium  Result Value Ref Range Status   SARS Coronavirus 2 by RT PCR NEGATIVE NEGATIVE Final    Comment: (NOTE) SARS-CoV-2 target nucleic acids are NOT DETECTED.  The SARS-CoV-2 RNA is generally detectable in upper respiratory specimens during the acute phase of infection. The lowest concentration of SARS-CoV-2 viral copies this assay can detect is 138 copies/mL. A negative result does not preclude SARS-Cov-2 infection and should not be used as the sole basis for treatment or other patient management decisions. A negative result may occur with  improper specimen collection/handling, submission of specimen other than nasopharyngeal swab, presence of viral mutation(s) within the areas targeted by this assay, and inadequate number of viral copies(<138 copies/mL). A negative result must be combined with clinical observations, patient history, and epidemiological information. The expected result is Negative.  Fact Sheet for Patients:  EntrepreneurPulse.com.au  Fact Sheet for Healthcare Providers:   IncredibleEmployment.be  This test is no t yet approved or cleared by the [place] FDA and  has been authorized for detection and/or diagnosis of SARS-CoV-2 by FDA under an Emergency Use Authorization (EUA). This EUA will remain  in effect (meaning this test can be used) for the duration of the COVID-19 declaration under Section 564(b)(1) of the Act, 21 U.S.C.section 360bbb-3(b)(1), unless the authorization is terminated  or revoked sooner.       Influenza A by PCR NEGATIVE NEGATIVE Final   Influenza B by PCR NEGATIVE NEGATIVE Final    Comment: (NOTE) The Xpert Xpress SARS-CoV-2/FLU/RSV plus assay is intended as an aid in the diagnosis of influenza from Nasopharyngeal swab specimens and should not be used as a sole basis for treatment. Nasal washings and aspirates are unacceptable for Xpert Xpress SARS-CoV-2/FLU/RSV testing.  Fact Sheet for Patients: EntrepreneurPulse.com.au  Fact Sheet for Healthcare Providers: IncredibleEmployment.be  This test is not yet approved or cleared by the [place] FDA and has been authorized for detection and/or diagnosis of SARS-CoV-2 by FDA under an Emergency Use Authorization (EUA). This EUA will remain in effect (meaning this test can be used) for the duration of the COVID-19 declaration under Section 564(b)(1) of the Act, 21 U.S.C. section 360bbb-3(b)(1), unless the authorization is terminated or revoked.  Performed at [facility], [place] [address] [place], [place] [address]   Blood Culture (routine x 2)     Status: None (Preliminary result)   Collection Time: 01/11/21 11:36 PM  Specimen: BLOOD  Result Value Ref Range Status   Specimen Description   Final    BLOOD BLOOD LEFT HAND Performed at [facility] [address], [place], [place] [address]    Special Requests   Final    BOTTLES DRAWN AEROBIC ONLY Blood Culture results may not  be optimal due to an inadequate volume of blood received in culture bottles Performed at [facility] [address] [place], [place] [address]    Culture  Setup Time   Final    GRAM NEGATIVE RODS AEROBIC BOTTLE ONLY CRITICAL VALUE NOTED.  VALUE IS CONSISTENT WITH PREVIOUSLY REPORTED AND CALLED VALUE.    Culture   Final    GRAM NEGATIVE RODS IDENTIFICATION TO FOLLOW KLEBSIELLA OXYTOCA SUSCEPTIBILITIES PERFORMED ON PREVIOUS CULTURE WITHIN THE LAST 5 DAYS. Performed at [facility], [place] [address], [place], [place] [address]    Report Status PENDING  Incomplete  Blood Culture (routine x 2)     Status: Abnormal   Collection Time: 01/11/21 11:41 PM   Specimen: BLOOD  Result Value Ref Range Status   Specimen Description   Final    BLOOD LEFT ANTECUBITAL Performed at [facility] [address], [place], [place] [address]    Special Requests   Final    BOTTLES DRAWN AEROBIC AND ANAEROBIC Blood Culture adequate volume Performed at [facility] [address] [place], [place] [address]    Culture  Setup Time   Final    GRAM NEGATIVE RODS GRAM POSITIVE COCCI IN BOTH AEROBIC AND ANAEROBIC BOTTLES CRITICAL RESULT CALLED TO, READ BACK BY AND VERIFIED WITH: TERRY GREEN PHARMD @1712  01/12/21 EB Performed at [facility], [place] [address], [place],  [address]    Culture (A)  Final    CITROBACTER BRAAKII KLEBSIELLA OXYTOCA ENTEROCOCCUS CASSELIFLAVUS    Report Status 01/16/2021 FINAL  Final   Organism ID, Bacteria CITROBACTER BRAAKII  Final   Organism ID, Bacteria KLEBSIELLA OXYTOCA  Final   Organism ID, Bacteria ENTEROCOCCUS CASSELIFLAVUS  Final      Susceptibility   Citrobacter braakii - MIC*    CEFAZOLIN >=64 RESISTANT Resistant     CEFEPIME <=0.12 SENSITIVE Sensitive     CEFTAZIDIME <=1 SENSITIVE Sensitive     CEFTRIAXONE <=0.25 SENSITIVE Sensitive     CIPROFLOXACIN <=0.25 SENSITIVE Sensitive      GENTAMICIN <=1 SENSITIVE Sensitive     IMIPENEM 0.5 SENSITIVE Sensitive     TRIMETH/SULFA <=20 SENSITIVE Sensitive     PIP/TAZO <=4 SENSITIVE Sensitive     * CITROBACTER BRAAKII   Enterococcus casseliflavus - MIC*    AMPICILLIN <=2 SENSITIVE Sensitive     VANCOMYCIN RESISTANT Resistant     GENTAMICIN SYNERGY SENSITIVE Sensitive     LINEZOLID 1 SENSITIVE Sensitive     * ENTEROCOCCUS CASSELIFLAVUS   Klebsiella oxytoca - MIC*    AMPICILLIN >=32 RESISTANT Resistant     CEFAZOLIN >=64 RESISTANT Resistant     CEFEPIME <=0.12 SENSITIVE Sensitive     CEFTAZIDIME <=1 SENSITIVE Sensitive     CEFTRIAXONE <=0.25 SENSITIVE Sensitive     CIPROFLOXACIN <=0.25 SENSITIVE Sensitive     GENTAMICIN <=1 SENSITIVE Sensitive     IMIPENEM <=0.25 SENSITIVE Sensitive     TRIMETH/SULFA <=20 SENSITIVE Sensitive     AMPICILLIN/SULBACTAM 8 SENSITIVE Sensitive     PIP/TAZO <=4 SENSITIVE Sensitive     * KLEBSIELLA OXYTOCA  Blood Culture ID Panel (Reflexed)  Status: Abnormal   Collection Time: 01/11/21 11:41 PM  Result Value Ref Range Status   Enterococcus faecalis NOT DETECTED NOT DETECTED Final   Enterococcus Faecium NOT DETECTED NOT DETECTED Final   Listeria monocytogenes NOT DETECTED NOT DETECTED Final   Staphylococcus species NOT DETECTED NOT DETECTED Final   Staphylococcus aureus (BCID) NOT DETECTED NOT DETECTED Final   Staphylococcus epidermidis NOT DETECTED NOT DETECTED Final   Staphylococcus lugdunensis NOT DETECTED NOT DETECTED Final   Streptococcus species NOT DETECTED NOT DETECTED Final   Streptococcus agalactiae NOT DETECTED NOT DETECTED Final   Streptococcus pneumoniae NOT DETECTED NOT DETECTED Final   Streptococcus pyogenes NOT DETECTED NOT DETECTED Final   A.calcoaceticus-baumannii NOT DETECTED NOT DETECTED Final   Bacteroides fragilis NOT DETECTED NOT DETECTED Final   Enterobacterales DETECTED (A) NOT DETECTED Final    Comment: CRITICAL RESULT CALLED TO, READ BACK BY AND VERIFIED  WITH: TERRY GREEN PHARMD @1712  01/12/21 EB    Enterobacter cloacae complex NOT DETECTED NOT DETECTED Final   Escherichia coli DETECTED (A) NOT DETECTED Final    Comment: CRITICAL RESULT CALLED TO, READ BACK BY AND VERIFIED WITH: TERRY GREEN PHARMD @1712  01/12/21 EB    Klebsiella aerogenes NOT DETECTED NOT DETECTED Final   Klebsiella oxytoca DETECTED (A) NOT DETECTED Final    Comment: CRITICAL RESULT CALLED TO, READ BACK BY AND VERIFIED WITH: TERRY GREEN PHARMD @1712  01/12/21 EB    Klebsiella pneumoniae NOT DETECTED NOT DETECTED Final   Proteus species NOT DETECTED NOT DETECTED Final   Salmonella species NOT DETECTED NOT DETECTED Final   Serratia marcescens NOT DETECTED NOT DETECTED Final   Haemophilus influenzae NOT DETECTED NOT DETECTED Final   Neisseria meningitidis NOT DETECTED NOT DETECTED Final   Pseudomonas aeruginosa NOT DETECTED NOT DETECTED Final   Stenotrophomonas maltophilia NOT DETECTED NOT DETECTED Final   Candida albicans NOT DETECTED NOT DETECTED Final   Candida auris NOT DETECTED NOT DETECTED Final   Candida glabrata NOT DETECTED NOT DETECTED Final   Candida krusei NOT DETECTED NOT DETECTED Final   Candida parapsilosis NOT DETECTED NOT DETECTED Final   Candida tropicalis NOT DETECTED NOT DETECTED Final   Cryptococcus neoformans/gattii NOT DETECTED NOT DETECTED Final   CTX-M ESBL NOT DETECTED NOT DETECTED Final   Carbapenem resistance IMP NOT DETECTED NOT DETECTED Final   Carbapenem resistance KPC NOT DETECTED NOT DETECTED Final   Carbapenem resistance NDM NOT DETECTED NOT DETECTED Final   Carbapenem resist OXA 48 LIKE NOT DETECTED NOT DETECTED Final   Carbapenem resistance VIM NOT DETECTED NOT DETECTED Final    Comment: Performed at [facility], [address] [place], [place] [address]  Urine culture     Status: None   Collection Time: 01/12/21  4:41 AM   Specimen: In/Out Cath Urine  Result Value Ref Range Status   Specimen Description   Final    IN/OUT  CATH URINE Performed at [facility] [address], [place], [place] [address]    Special Requests   Final    NONE Performed at [facility], [place] [address], [place], [place] [address]    Culture   Final    NO GROWTH Performed at [facility], [place] [address] [place], [place] [address]    Report Status 01/13/2021 FINAL  Final  MRSA PCR Screening     Status: None   Collection Time: 01/12/21  6:04 AM   Specimen: Nasal Mucosa; Nasopharyngeal  Result Value Ref Range Status  MRSA by PCR NEGATIVE NEGATIVE Final    Comment:        The GeneXpert MRSA Assay (FDA approved for NASAL specimens only), is one component of a comprehensive MRSA colonization surveillance program. It is not intended to diagnose MRSA infection nor to guide or monitor treatment for MRSA infections. Performed at [facility], [place] [address] [place], [place] [address]   Culture, blood (routine x 2)     Status: None (Preliminary result)   Collection Time: 01/14/21 11:03 AM   Specimen: BLOOD  Result Value Ref Range Status   Specimen Description   Final    BLOOD BLOOD LEFT HAND Performed at [facility] [address] [place], [place] [address]    Special Requests   Final    BOTTLES DRAWN AEROBIC ONLY Blood Culture adequate volume Performed at [facility] [address], [place], [place] [address]    Culture   Final    NO GROWTH 3 DAYS Performed at [facility], [place] [address] [place], [place] [address]    Report Status PENDING  Incomplete  Culture, blood (routine x 2)     Status: None (Preliminary result)   Collection Time: 01/14/21 11:03 AM   Specimen: BLOOD  Result Value Ref Range Status   Specimen Description   Final    BLOOD BLOOD LEFT HAND Performed at [facility] [address], [place], [place] [address]    Special Requests   Final    BOTTLES DRAWN AEROBIC ONLY  Blood Culture adequate volume Performed at [facility] [address] [place], [place] [address]    Culture   Final    NO GROWTH 3 DAYS Performed at [facility], [place] [address], [place], [place] [address]    Report Status PENDING  Incomplete     Labs: BNP (last 3 results) No results for input(s): BNP in the last 8760 hours. Basic Metabolic Panel: Recent Labs  Lab 01/13/21 0548 01/14/21 0255 01/15/21 0515 01/16/21 0619 01/17/21 0527  NA 140 141 144 142 142  K 4.5 3.5 3.9 3.9 4.0  CL 112* 114* 114* 116* 112*  CO2 18* 18* 21* 20* 22  GLUCOSE 143* 119* 100* 114* 123*  BUN 46* 46* 34* 25* 27*  CREATININE 1.99* 1.63* 1.37* 1.25* 1.23*  CALCIUM 7.8* 8.1* 8.3* 8.5* 8.7*   Liver Function Tests: Recent Labs  Lab 01/11/21 2336 01/12/21 0742 01/13/21 0548 01/14/21 0255 01/15/21 0515  AST 330* 176* 94* 44* 23  ALT 425* 272* 194* 148* 102*  ALKPHOS 102 81 76 81 76  BILITOT 1.3* 0.9 0.8 1.0 0.9  PROT 5.8* 5.3* 5.5* 5.4* 5.5*  ALBUMIN 3.1* 2.9* 3.1* 3.0* 3.0*   Recent Labs  Lab 01/11/21 2336  LIPASE 39   No results for input(s): AMMONIA in the last 168 hours. CBC: Recent Labs  Lab 01/11/21 2336 01/12/21 0742 01/13/21 0548 01/14/21 0255 01/15/21 0515 01/16/21 0619  WBC 9.5 16.3* 16.5* 11.8* 5.0 5.8  NEUTROABS 8.7* 13.9* 14.3*  --  3.3 3.1  HGB 12.2 10.4* 10.6* 10.5* 10.8* 10.5*  HCT 37.5 31.3* 32.8* 32.0* 33.4* 31.6*  MCV 91.7 92.1 92.9 90.7 93.3 91.1  PLT 110* 98* 93* 108* 116* 133*   Cardiac Enzymes: No results for input(s): CKTOTAL, CKMB, CKMBINDEX, TROPONINI in the last 168 hours. BNP: Invalid input(s): POCBNP CBG: Recent Labs  Lab 01/16/21 0412 01/16/21 0759 01/16/21 1155 01/16/21 1716 01/17/21 0755  GLUCAP  104* 112* 120* 175* 122*   D-Dimer No results for input(s): DDIMER in the last 72 hours. Hgb A1c No results for input(s): HGBA1C in the last 72 hours. Lipid Profile No results for input(s): CHOL, HDL, LDLCALC,  TRIG, CHOLHDL, LDLDIRECT in the last 72 hours. Thyroid function studies No results for input(s): TSH, T4TOTAL, T3FREE, THYROIDAB in the last 72 hours.  Invalid input(s): FREET3 Anemia work up No results for input(s): VITAMINB12, FOLATE, FERRITIN, TIBC, IRON, RETICCTPCT in the last 72 hours. Urinalysis    Component Value Date/Time   COLORURINE YELLOW 01/12/2021 0441   APPEARANCEUR HAZY (A) 01/12/2021 0441   LABSPEC 1.020 01/12/2021 0441   PHURINE 5.0 01/12/2021 0441   GLUCOSEU >=500 (A) 01/12/2021 0441   HGBUR SMALL (A) 01/12/2021 0441   BILIRUBINUR NEGATIVE 01/12/2021 0441   KETONESUR NEGATIVE 01/12/2021 0441   PROTEINUR >=300 (A) 01/12/2021 0441   NITRITE NEGATIVE 01/12/2021 0441   LEUKOCYTESUR NEGATIVE 01/12/2021 0441   Sepsis Labs Invalid input(s): PROCALCITONIN,  WBC,  LACTICIDVEN Microbiology Recent Results (from the past 240 hour(s))  Resp Panel by RT-PCR (Flu A&B, Covid) Nasopharyngeal Swab     Status: None   Collection Time: 01/11/21 11:36 PM   Specimen: Nasopharyngeal Swab; Nasopharyngeal(NP) swabs in vial transport medium  Result Value Ref Range Status   SARS Coronavirus 2 by RT PCR NEGATIVE NEGATIVE Final    Comment: (NOTE) SARS-CoV-2 target nucleic acids are NOT DETECTED.  The SARS-CoV-2 RNA is generally detectable in upper respiratory specimens during the acute phase of infection. The lowest concentration of SARS-CoV-2 viral copies this assay can detect is 138 copies/mL. A negative result does not preclude SARS-Cov-2 infection and should not be used as the sole basis for treatment or other patient management decisions. A negative result may occur with  improper specimen collection/handling, submission of specimen other than nasopharyngeal swab, presence of viral mutation(s) within the areas targeted by this assay, and inadequate number of viral copies(<138 copies/mL). A negative result must be combined with clinical observations, patient history, and  epidemiological information. The expected result is Negative.  Fact Sheet for Patients:  EntrepreneurPulse.com.au  Fact Sheet for Healthcare Providers:  IncredibleEmployment.be  This test is no t yet approved or cleared by the [place] FDA and  has been authorized for detection and/or diagnosis of SARS-CoV-2 by FDA under an Emergency Use Authorization (EUA). This EUA will remain  in effect (meaning this test can be used) for the duration of the COVID-19 declaration under Section 564(b)(1) of the Act, 21 U.S.C.section 360bbb-3(b)(1), unless the authorization is terminated  or revoked sooner.       Influenza A by PCR NEGATIVE NEGATIVE Final   Influenza B by PCR NEGATIVE NEGATIVE Final    Comment: (NOTE) The Xpert Xpress SARS-CoV-2/FLU/RSV plus assay is intended as an aid in the diagnosis of influenza from Nasopharyngeal swab specimens and should not be used as a sole basis for treatment. Nasal washings and aspirates are unacceptable for Xpert Xpress SARS-CoV-2/FLU/RSV testing.  Fact Sheet for Patients: EntrepreneurPulse.com.au  Fact Sheet for Healthcare Providers: IncredibleEmployment.be  This test is not yet approved or cleared by the [place] FDA and has been authorized for detection and/or diagnosis of SARS-CoV-2 by FDA under an Emergency Use Authorization (EUA). This EUA will remain in effect (meaning this test can be used) for the duration of the COVID-19 declaration under Section 564(b)(1) of the Act, 21 U.S.C. section 360bbb-3(b)(1), unless the authorization is terminated or revoked.  Performed at [facility], [address] [place], [place] [address]   Blood Culture (routine x 2)     Status: None (Preliminary result)   Collection Time: 01/11/21 11:36 PM   Specimen: BLOOD  Result Value Ref Range Status   Specimen Description   Final    BLOOD BLOOD LEFT  HAND Performed at [facility] [address] [place], [place] [address]    Special Requests   Final    BOTTLES DRAWN AEROBIC ONLY Blood Culture results may not be optimal due to an inadequate volume of blood received in culture bottles Performed at [facility] [address], [place], [place] [address]    Culture  Setup Time   Final    GRAM NEGATIVE RODS AEROBIC BOTTLE ONLY CRITICAL VALUE NOTED.  VALUE IS CONSISTENT WITH PREVIOUSLY REPORTED AND CALLED VALUE.    Culture   Final    GRAM NEGATIVE RODS IDENTIFICATION TO FOLLOW KLEBSIELLA OXYTOCA SUSCEPTIBILITIES PERFORMED ON PREVIOUS CULTURE WITHIN THE LAST 5 DAYS. Performed at [facility], [place] [address] [place], [place] [address]    Report Status PENDING  Incomplete  Blood Culture (routine x 2)     Status: Abnormal   Collection Time: 01/11/21 11:41 PM   Specimen: BLOOD  Result Value Ref Range Status   Specimen Description   Final    BLOOD LEFT ANTECUBITAL Performed at [facility] [address] [place], [place] [address]    Special Requests   Final    BOTTLES DRAWN AEROBIC AND ANAEROBIC Blood Culture adequate volume Performed at [facility] [address], [place], [place] [address]    Culture  Setup Time   Final    GRAM NEGATIVE RODS GRAM POSITIVE COCCI IN BOTH AEROBIC AND ANAEROBIC BOTTLES CRITICAL RESULT CALLED TO, READ BACK BY AND VERIFIED WITH: TERRY GREEN PHARMD @1712  01/12/21 EB Performed at [facility], [place] [address] [place], [place] [address]    Culture (A)  Final    CITROBACTER BRAAKII KLEBSIELLA OXYTOCA ENTEROCOCCUS CASSELIFLAVUS    Report Status 01/16/2021 FINAL  Final   Organism ID, Bacteria CITROBACTER BRAAKII  Final   Organism ID, Bacteria KLEBSIELLA OXYTOCA  Final   Organism ID, Bacteria ENTEROCOCCUS CASSELIFLAVUS  Final      Susceptibility   Citrobacter braakii - MIC*    CEFAZOLIN >=64 RESISTANT  Resistant     CEFEPIME <=0.12 SENSITIVE Sensitive     CEFTAZIDIME <=1 SENSITIVE Sensitive     CEFTRIAXONE <=0.25 SENSITIVE Sensitive     CIPROFLOXACIN <=0.25 SENSITIVE Sensitive     GENTAMICIN <=1 SENSITIVE Sensitive     IMIPENEM 0.5 SENSITIVE Sensitive     TRIMETH/SULFA <=20 SENSITIVE Sensitive     PIP/TAZO <=4 SENSITIVE Sensitive     * CITROBACTER BRAAKII   Enterococcus casseliflavus - MIC*    AMPICILLIN <=2 SENSITIVE Sensitive     VANCOMYCIN RESISTANT Resistant     GENTAMICIN SYNERGY SENSITIVE Sensitive     LINEZOLID 1 SENSITIVE Sensitive     * ENTEROCOCCUS CASSELIFLAVUS   Klebsiella oxytoca - MIC*    AMPICILLIN >=32 RESISTANT Resistant     CEFAZOLIN >=64 RESISTANT Resistant     CEFEPIME <=0.12 SENSITIVE Sensitive     CEFTAZIDIME <=1 SENSITIVE Sensitive     CEFTRIAXONE <=0.25 SENSITIVE Sensitive     CIPROFLOXACIN <=0.25 SENSITIVE Sensitive     GENTAMICIN <=1 SENSITIVE Sensitive     IMIPENEM <=0.25 SENSITIVE Sensitive     TRIMETH/SULFA <=20 SENSITIVE Sensitive  AMPICILLIN/SULBACTAM 8 SENSITIVE Sensitive     PIP/TAZO <=4 SENSITIVE Sensitive     * KLEBSIELLA OXYTOCA  Blood Culture ID Panel (Reflexed)     Status: Abnormal   Collection Time: 01/11/21 11:41 PM  Result Value Ref Range Status   Enterococcus faecalis NOT DETECTED NOT DETECTED Final   Enterococcus Faecium NOT DETECTED NOT DETECTED Final   Listeria monocytogenes NOT DETECTED NOT DETECTED Final   Staphylococcus species NOT DETECTED NOT DETECTED Final   Staphylococcus aureus (BCID) NOT DETECTED NOT DETECTED Final   Staphylococcus epidermidis NOT DETECTED NOT DETECTED Final   Staphylococcus lugdunensis NOT DETECTED NOT DETECTED Final   Streptococcus species NOT DETECTED NOT DETECTED Final   Streptococcus agalactiae NOT DETECTED NOT DETECTED Final   Streptococcus pneumoniae NOT DETECTED NOT DETECTED Final   Streptococcus pyogenes NOT DETECTED NOT DETECTED Final   A.calcoaceticus-baumannii NOT DETECTED NOT DETECTED  Final   Bacteroides fragilis NOT DETECTED NOT DETECTED Final   Enterobacterales DETECTED (A) NOT DETECTED Final    Comment: CRITICAL RESULT CALLED TO, READ BACK BY AND VERIFIED WITH: TERRY GREEN PHARMD @1712  01/12/21 EB    Enterobacter cloacae complex NOT DETECTED NOT DETECTED Final   Escherichia coli DETECTED (A) NOT DETECTED Final    Comment: CRITICAL RESULT CALLED TO, READ BACK BY AND VERIFIED WITH: TERRY GREEN PHARMD @1712  01/12/21 EB    Klebsiella aerogenes NOT DETECTED NOT DETECTED Final   Klebsiella oxytoca DETECTED (A) NOT DETECTED Final    Comment: CRITICAL RESULT CALLED TO, READ BACK BY AND VERIFIED WITH: TERRY GREEN PHARMD @1712  01/12/21 EB    Klebsiella pneumoniae NOT DETECTED NOT DETECTED Final   Proteus species NOT DETECTED NOT DETECTED Final   Salmonella species NOT DETECTED NOT DETECTED Final   Serratia marcescens NOT DETECTED NOT DETECTED Final   Haemophilus influenzae NOT DETECTED NOT DETECTED Final   Neisseria meningitidis NOT DETECTED NOT DETECTED Final   Pseudomonas aeruginosa NOT DETECTED NOT DETECTED Final   Stenotrophomonas maltophilia NOT DETECTED NOT DETECTED Final   Candida albicans NOT DETECTED NOT DETECTED Final   Candida auris NOT DETECTED NOT DETECTED Final   Candida glabrata NOT DETECTED NOT DETECTED Final   Candida krusei NOT DETECTED NOT DETECTED Final   Candida parapsilosis NOT DETECTED NOT DETECTED Final   Candida tropicalis NOT DETECTED NOT DETECTED Final   Cryptococcus neoformans/gattii NOT DETECTED NOT DETECTED Final   CTX-M ESBL NOT DETECTED NOT DETECTED Final   Carbapenem resistance IMP NOT DETECTED NOT DETECTED Final   Carbapenem resistance KPC NOT DETECTED NOT DETECTED Final   Carbapenem resistance NDM NOT DETECTED NOT DETECTED Final   Carbapenem resist OXA 48 LIKE NOT DETECTED NOT DETECTED Final   Carbapenem resistance VIM NOT DETECTED NOT DETECTED Final    Comment: Performed at [facility], [address] [place], [place]  [address]  Urine culture     Status: None   Collection Time: 01/12/21  4:41 AM   Specimen: In/Out Cath Urine  Result Value Ref Range Status   Specimen Description   Final    IN/OUT CATH URINE Performed at [facility] [address] [place], [place] [address]    Special Requests   Final    NONE Performed at [facility], [place] [address], [place], [place] [address]    Culture   Final    NO GROWTH Performed at [facility], [place] [address] [place], [place] [address]    Report Status 01/13/2021 FINAL  Final  MRSA  PCR Screening     Status: None   Collection Time: 01/12/21  6:04 AM   Specimen: Nasal Mucosa; Nasopharyngeal  Result Value Ref Range Status   MRSA by PCR NEGATIVE NEGATIVE Final    Comment:        The GeneXpert MRSA Assay (FDA approved for NASAL specimens only), is one component of a comprehensive MRSA colonization surveillance program. It is not intended to diagnose MRSA infection nor to guide or monitor treatment for MRSA infections. Performed at [facility], [place] [address], [place], [place] [address]   Culture, blood (routine x 2)     Status: None (Preliminary result)   Collection Time: 01/14/21 11:03 AM   Specimen: BLOOD  Result Value Ref Range Status   Specimen Description   Final    BLOOD BLOOD LEFT HAND Performed at [facility] [address], [place], [place] [address]    Special Requests   Final    BOTTLES DRAWN AEROBIC ONLY Blood Culture adequate volume Performed at [facility] [address] [place], [place] [address]    Culture   Final    NO GROWTH 3 DAYS Performed at [facility], [place] [address], [place], [place] [address]    Report Status PENDING  Incomplete  Culture, blood (routine x 2)     Status: None (Preliminary result)   Collection Time: 01/14/21 11:03 AM   Specimen: BLOOD  Result Value Ref Range Status   Specimen Description    Final    BLOOD BLOOD LEFT HAND Performed at [facility] [address], [place], [place] [address]    Special Requests   Final    BOTTLES DRAWN AEROBIC ONLY Blood Culture adequate volume Performed at [facility] [address], [place], [place] [address]    Culture   Final    NO GROWTH 3 DAYS Performed at [facility], [place] [address] [place], [place] [address]    Report Status PENDING  Incomplete     Patient was seen and examined on the day of discharge and was found to be in stable condition. Time coordinating discharge: 20 minutes including assessment and coordination of care, as well as examination of the patient.   SIGNED:  Dessa Phi, DO [facility] 01/17/2021, 8:11 AM

## 2021-01-18 LAB — CULTURE, BLOOD (ROUTINE X 2)

## 2021-01-19 LAB — CULTURE, BLOOD (ROUTINE X 2)
Culture: NO GROWTH
Culture: NO GROWTH
Special Requests: ADEQUATE
Special Requests: ADEQUATE

## 2021-01-21 ENCOUNTER — Other Ambulatory Visit: Payer: PPO

## 2021-01-22 DIAGNOSIS — E119 Type 2 diabetes mellitus without complications: Secondary | ICD-10-CM | POA: Diagnosis not present

## 2021-01-25 ENCOUNTER — Other Ambulatory Visit: Payer: Self-pay

## 2021-01-25 ENCOUNTER — Ambulatory Visit (INDEPENDENT_AMBULATORY_CARE_PROVIDER_SITE_OTHER): Payer: PPO | Admitting: Internal Medicine

## 2021-01-25 ENCOUNTER — Encounter: Payer: Self-pay | Admitting: Internal Medicine

## 2021-01-25 VITALS — BP 130/86 | HR 78 | Temp 98.0°F | Ht 63.0 in | Wt 180.0 lb

## 2021-01-25 DIAGNOSIS — R7881 Bacteremia: Secondary | ICD-10-CM | POA: Diagnosis not present

## 2021-01-25 DIAGNOSIS — K8309 Other cholangitis: Secondary | ICD-10-CM | POA: Diagnosis not present

## 2021-01-25 NOTE — Progress Notes (Signed)
[facility]  Reason for Consult: Hospital follow-up for gram-negative rod bacteremia  Referring Provider: [facility] hospitalist   HPI:    Tina Briggs is a 75 y.o. female with PMHx as below who presents to the clinic for hospital follow-up of bacteremia.   Tina Briggs returns for her routine hospital follow-up.  She was admitted at [facility] from 01/11/2021 through 01/17/2021.  She was admitted with sepsis secondary to ascending cholangitis after presenting with right upper quadrant pain, fevers, encephalopathy and CT findings of calculi in the central biliary tree on the left with intrahepatic biliary dilation as well as LFTs in a hepatocellular pattern.  She underwent ERCP on 01/12/2021 with stone removal.  She clinically responded very well to antibiotics and source control and at the time of discharge remained afebrile, normal WBC, and improved LFTs.  She received approximately 5 days of adequate IV antibiotics including anaerobic coverage following her ERCP.  At discharge she was transitioned to ciprofloxacin 500 mg twice daily for another 5 days.  This provided her with 10 days of antibiotic coverage from the date of her ERCP.  She completed therapy on 01/22/2021 that presents for hospital follow-up today.  Since discharge she has continued to do well without any fevers or chills.  She has had no recurrence of her abdominal pain.  She did have some mild itching on day #3 of cipro but this seemed to resolve and she has not had any issues since.  Her main complaint is some continued fatigue but not out of the ordinary given her recent hospitalization.  She is going to see her PCP next week.   Of note, she had an allergy to penicillin listed in her chart, however, she was challenged with piperacillin tazobactam and tolerated it well during her hospitalization.  Penicillin was subsequently taken off her medication allergy list.  Patient's Medications  New  Prescriptions   No medications on file  Previous Medications   ACETAMINOPHEN (TYLENOL) 500 MG TABLET    Take 1,000 mg by mouth 2 (two) times daily as needed for moderate pain or headache.    ASPIRIN EC 81 MG TABLET    Take 81 mg by mouth daily.   CHOLECALCIFEROL (VITAMIN D) 1000 UNITS TABLET    Take 3,000 Units by mouth daily.   CHOLESTYRAMINE LIGHT (PREVALITE) 4 G PACKET    Take 4 g by mouth every other day.   CRESTOR 40 MG TABLET    TAKE 1 TABLET AT BEDTIME   FAMOTIDINE (PEPCID) 40 MG TABLET    Take 40 mg by mouth 2 (two) times daily as needed for heartburn or indigestion.   FLUTICASONE (FLONASE) 50 MCG/ACT NASAL SPRAY    Place 2 sprays into both nostrils daily as needed for allergies.    GABAPENTIN (NEURONTIN) 300 MG CAPSULE    Take 300-600 mg by mouth See admin instructions. 300mg  in the morning 600mg  at night   HYDRALAZINE (APRESOLINE) 10 MG TABLET    Take 1 tablet (10 mg total) by mouth at bedtime.   JARDIANCE 25 MG TABS TABLET    Take 25 mg by mouth daily.   MULTIPLE VITAMINS-MINERALS (PRESERVISION AREDS 2 PO)    Take 1 tablet by mouth 2 (two) times daily.   NOVOLOG FLEXPEN RELION 100 UNIT/ML FLEXPEN    Inject 14-16 Units into the skin 3 (three) times daily as needed. BS   VENLAFAXINE XR (EFFEXOR-XR) 75 MG 24 HR CAPSULE  Take 75 mg by mouth every morning.    VITAMIN B-12 (CYANOCOBALAMIN) 1000 MCG TABLET    Take 1,000 mcg by mouth 3 (three) times a week. Take on Sundays, Wednesdays and Fridays.  Modified Medications   No medications on file  Discontinued Medications   No medications on file      Past Medical History:  Diagnosis Date  . Anemia   . Aspiration pneumonia ([place])    after common bile duct obstruction  . CATARACT, RIGHT EYE    had repaired  . Depression   . Diverticulitis    recurrent episodes  . DM   . GERD   . 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 Mild to mod TI scarring. (Dr. Lorrene Reid)  . HNP (herniated  nucleus pulposus), lumbar    Recurrent  . HYPERLIPIDEMIA-MIXED   . HYPERTENSION, UNSPECIFIED   . OBESITY   . PONV (postoperative nausea and vomiting)    past history only, "sometimes they put a patch on me"    Social History   Tobacco Use  . Smoking status: Never Smoker  . Smokeless tobacco: Never Used  Vaping Use  . Vaping Use: Never used  Substance Use Topics  . Alcohol use: No  . Drug use: No    Family History  Problem Relation Age of Onset  . Cancer Maternal Aunt        breast cancer  . Cancer Maternal Grandmother        kidney cancer   . Cancer Paternal Grandmother        bone cancer   . Heart disease Mother   . Alzheimer's disease Father   . Tremor Father        possible PD  . Diabetes Mellitus II Brother   . Healthy Son   . Healthy Son     Allergies  Allergen Reactions  . Codeine Shortness Of Breath  . Clavulanic Acid   . Flagyl [Metronidazole]   . Other Other (See Comments)  . Prevacid [Lansoprazole]   . Tetracyclines & Related Itching  . Trulicity [Dulaglutide]   . Azithromycin Itching and Rash  . Erythromycin Itching and Rash  . Morphine And Related Itching and Rash  . Septra [Sulfamethoxazole-Trimethoprim] Itching and Rash    Review of Systems  Constitutional: Positive for malaise/fatigue. Negative for chills and fever.  Respiratory: Negative.   Cardiovascular: Negative.   Gastrointestinal: Negative for abdominal pain, diarrhea, nausea and vomiting.  Skin: Positive for itching (resolved).      OBJECTIVE:    Vitals:   01/25/21 1541  BP: 130/86  Pulse: 78  Temp: 98 F (36.7 C)  TempSrc: Oral  SpO2: 98%  Weight: 180 lb (81.6 kg)  Height: 5\' 3"  (1.6 m)     Body mass index is 31.89 kg/m.  Physical Exam Constitutional:      General: She is not in acute distress.    Appearance: Normal appearance.  Pulmonary:     Effort: Pulmonary effort is normal. No respiratory distress.  Skin:    General: Skin is warm and dry.  Neurological:      General: No focal deficit present.     Mental Status: She is alert and oriented to person, place, and time.  Psychiatric:        Mood and Affect: Mood normal.        Behavior: Behavior normal.      Labs and Microbiology:  CBC Latest Ref Rng & Units 01/16/2021 01/15/2021 01/14/2021  WBC 4.0 - 10.5 K/uL 5.8 5.0 11.8(H)  Hemoglobin 12.0 - 15.0 g/dL 10.5(L) 10.8(L) 10.5(L)  Hematocrit 36.0 - 46.0 % 31.6(L) 33.4(L) 32.0(L)  Platelets 150 - 400 K/uL 133(L) 116(L) 108(L)   CMP Latest Ref Rng & Units 01/17/2021 01/16/2021 01/15/2021  Glucose 70 - 99 mg/dL 123(H) 114(H) 100(H)  BUN 8 - 23 mg/dL 27(H) 25(H) 34(H)  Creatinine 0.44 - 1.00 mg/dL 1.23(H) 1.25(H) 1.37(H)  Sodium 135 - 145 mmol/L 142 142 144  Potassium 3.5 - 5.1 mmol/L 4.0 3.9 3.9  Chloride 98 - 111 mmol/L 112(H) 116(H) 114(H)  CO2 22 - 32 mmol/L 22 20(L) 21(L)  Calcium 8.9 - 10.3 mg/dL 8.7(L) 8.5(L) 8.3(L)  Total Protein 6.5 - 8.1 g/dL - - 5.5(L)  Total Bilirubin 0.3 - 1.2 mg/dL - - 0.9  Alkaline Phos 38 - 126 U/L - - 76  AST 15 - 41 U/L - - 23  ALT 0 - 44 U/L - - 102(H)       ASSESSMENT & PLAN:    1. Ascending cholangitis  2. Bacteremia  She has completed 10 days of abx for polymicrobial bacteremia in the setting of ascending cholangitis s/p ERCP.  She is doing well and has no concerns for infection at this time.  Will have her follow up as needed with [place].  She sees her PCP next week.    Raynelle High[facility] 01/25/2021, 3:56 PM

## 2021-01-26 DIAGNOSIS — D509 Iron deficiency anemia, unspecified: Secondary | ICD-10-CM | POA: Diagnosis not present

## 2021-01-26 DIAGNOSIS — I10 Essential (primary) hypertension: Secondary | ICD-10-CM | POA: Diagnosis not present

## 2021-01-26 DIAGNOSIS — N189 Chronic kidney disease, unspecified: Secondary | ICD-10-CM | POA: Diagnosis not present

## 2021-01-26 DIAGNOSIS — E782 Mixed hyperlipidemia: Secondary | ICD-10-CM | POA: Diagnosis not present

## 2021-01-26 DIAGNOSIS — F324 Major depressive disorder, single episode, in partial remission: Secondary | ICD-10-CM | POA: Diagnosis not present

## 2021-01-26 DIAGNOSIS — K219 Gastro-esophageal reflux disease without esophagitis: Secondary | ICD-10-CM | POA: Diagnosis not present

## 2021-01-26 DIAGNOSIS — E119 Type 2 diabetes mellitus without complications: Secondary | ICD-10-CM | POA: Diagnosis not present

## 2021-01-26 DIAGNOSIS — E1122 Type 2 diabetes mellitus with diabetic chronic kidney disease: Secondary | ICD-10-CM | POA: Diagnosis not present

## 2021-01-31 DIAGNOSIS — E785 Hyperlipidemia, unspecified: Secondary | ICD-10-CM | POA: Diagnosis not present

## 2021-01-31 DIAGNOSIS — I129 Hypertensive chronic kidney disease with stage 1 through stage 4 chronic kidney disease, or unspecified chronic kidney disease: Secondary | ICD-10-CM | POA: Diagnosis not present

## 2021-01-31 DIAGNOSIS — E1122 Type 2 diabetes mellitus with diabetic chronic kidney disease: Secondary | ICD-10-CM | POA: Diagnosis not present

## 2021-01-31 DIAGNOSIS — N1831 Chronic kidney disease, stage 3a: Secondary | ICD-10-CM | POA: Diagnosis not present

## 2021-01-31 DIAGNOSIS — N052 Unspecified nephritic syndrome with diffuse membranous glomerulonephritis: Secondary | ICD-10-CM | POA: Diagnosis not present

## 2021-02-01 DIAGNOSIS — A419 Sepsis, unspecified organism: Secondary | ICD-10-CM | POA: Diagnosis not present

## 2021-02-01 DIAGNOSIS — I10 Essential (primary) hypertension: Secondary | ICD-10-CM | POA: Diagnosis not present

## 2021-02-01 DIAGNOSIS — K8020 Calculus of gallbladder without cholecystitis without obstruction: Secondary | ICD-10-CM | POA: Diagnosis not present

## 2021-02-01 DIAGNOSIS — N179 Acute kidney failure, unspecified: Secondary | ICD-10-CM | POA: Diagnosis not present

## 2021-02-01 DIAGNOSIS — K8309 Other cholangitis: Secondary | ICD-10-CM | POA: Diagnosis not present

## 2021-02-01 DIAGNOSIS — N1832 Chronic kidney disease, stage 3b: Secondary | ICD-10-CM | POA: Diagnosis not present

## 2021-02-01 DIAGNOSIS — I700 Atherosclerosis of aorta: Secondary | ICD-10-CM | POA: Diagnosis not present

## 2021-02-12 DIAGNOSIS — Z794 Long term (current) use of insulin: Secondary | ICD-10-CM | POA: Diagnosis not present

## 2021-02-12 DIAGNOSIS — N1832 Chronic kidney disease, stage 3b: Secondary | ICD-10-CM | POA: Diagnosis not present

## 2021-02-12 DIAGNOSIS — I700 Atherosclerosis of aorta: Secondary | ICD-10-CM | POA: Diagnosis not present

## 2021-02-12 DIAGNOSIS — E1122 Type 2 diabetes mellitus with diabetic chronic kidney disease: Secondary | ICD-10-CM | POA: Diagnosis not present

## 2021-02-21 DIAGNOSIS — E119 Type 2 diabetes mellitus without complications: Secondary | ICD-10-CM | POA: Diagnosis not present

## 2021-02-21 DIAGNOSIS — N052 Unspecified nephritic syndrome with diffuse membranous glomerulonephritis: Secondary | ICD-10-CM | POA: Diagnosis not present

## 2021-02-27 DIAGNOSIS — F324 Major depressive disorder, single episode, in partial remission: Secondary | ICD-10-CM | POA: Diagnosis not present

## 2021-02-27 DIAGNOSIS — K591 Functional diarrhea: Secondary | ICD-10-CM | POA: Diagnosis not present

## 2021-02-27 DIAGNOSIS — E1122 Type 2 diabetes mellitus with diabetic chronic kidney disease: Secondary | ICD-10-CM | POA: Diagnosis not present

## 2021-02-27 DIAGNOSIS — E119 Type 2 diabetes mellitus without complications: Secondary | ICD-10-CM | POA: Diagnosis not present

## 2021-02-27 DIAGNOSIS — E782 Mixed hyperlipidemia: Secondary | ICD-10-CM | POA: Diagnosis not present

## 2021-02-27 DIAGNOSIS — D509 Iron deficiency anemia, unspecified: Secondary | ICD-10-CM | POA: Diagnosis not present

## 2021-02-27 DIAGNOSIS — N189 Chronic kidney disease, unspecified: Secondary | ICD-10-CM | POA: Diagnosis not present

## 2021-02-27 DIAGNOSIS — K219 Gastro-esophageal reflux disease without esophagitis: Secondary | ICD-10-CM | POA: Diagnosis not present

## 2021-02-27 DIAGNOSIS — I10 Essential (primary) hypertension: Secondary | ICD-10-CM | POA: Diagnosis not present

## 2021-02-27 DIAGNOSIS — Z8719 Personal history of other diseases of the digestive system: Secondary | ICD-10-CM | POA: Diagnosis not present

## 2021-02-27 DIAGNOSIS — Z86010 Personal history of colonic polyps: Secondary | ICD-10-CM | POA: Diagnosis not present

## 2021-02-27 DIAGNOSIS — M1990 Unspecified osteoarthritis, unspecified site: Secondary | ICD-10-CM | POA: Diagnosis not present

## 2021-02-27 DIAGNOSIS — N052 Unspecified nephritic syndrome with diffuse membranous glomerulonephritis: Secondary | ICD-10-CM | POA: Diagnosis not present

## 2021-02-27 DIAGNOSIS — F322 Major depressive disorder, single episode, severe without psychotic features: Secondary | ICD-10-CM | POA: Diagnosis not present

## 2021-03-06 ENCOUNTER — Encounter: Payer: PPO | Admitting: Neurology

## 2021-03-06 DIAGNOSIS — K219 Gastro-esophageal reflux disease without esophagitis: Secondary | ICD-10-CM | POA: Diagnosis not present

## 2021-03-06 DIAGNOSIS — E1122 Type 2 diabetes mellitus with diabetic chronic kidney disease: Secondary | ICD-10-CM | POA: Diagnosis not present

## 2021-03-06 DIAGNOSIS — N1832 Chronic kidney disease, stage 3b: Secondary | ICD-10-CM | POA: Diagnosis not present

## 2021-03-06 DIAGNOSIS — E782 Mixed hyperlipidemia: Secondary | ICD-10-CM | POA: Diagnosis not present

## 2021-03-06 DIAGNOSIS — M1990 Unspecified osteoarthritis, unspecified site: Secondary | ICD-10-CM | POA: Diagnosis not present

## 2021-03-06 DIAGNOSIS — I10 Essential (primary) hypertension: Secondary | ICD-10-CM | POA: Diagnosis not present

## 2021-03-06 DIAGNOSIS — E119 Type 2 diabetes mellitus without complications: Secondary | ICD-10-CM | POA: Diagnosis not present

## 2021-03-27 DIAGNOSIS — E119 Type 2 diabetes mellitus without complications: Secondary | ICD-10-CM | POA: Diagnosis not present

## 2021-04-05 DIAGNOSIS — N1831 Chronic kidney disease, stage 3a: Secondary | ICD-10-CM | POA: Diagnosis not present

## 2021-04-05 DIAGNOSIS — E1122 Type 2 diabetes mellitus with diabetic chronic kidney disease: Secondary | ICD-10-CM | POA: Diagnosis not present

## 2021-04-05 DIAGNOSIS — I129 Hypertensive chronic kidney disease with stage 1 through stage 4 chronic kidney disease, or unspecified chronic kidney disease: Secondary | ICD-10-CM | POA: Diagnosis not present

## 2021-04-05 DIAGNOSIS — N052 Unspecified nephritic syndrome with diffuse membranous glomerulonephritis: Secondary | ICD-10-CM | POA: Diagnosis not present

## 2021-04-05 DIAGNOSIS — E785 Hyperlipidemia, unspecified: Secondary | ICD-10-CM | POA: Diagnosis not present

## 2021-04-11 ENCOUNTER — Other Ambulatory Visit: Payer: Self-pay

## 2021-04-11 ENCOUNTER — Ambulatory Visit: Payer: PPO | Admitting: Neurology

## 2021-04-11 DIAGNOSIS — R292 Abnormal reflex: Secondary | ICD-10-CM

## 2021-04-11 DIAGNOSIS — Z9889 Other specified postprocedural states: Secondary | ICD-10-CM | POA: Diagnosis not present

## 2021-04-11 DIAGNOSIS — G5732 Lesion of lateral popliteal nerve, left lower limb: Secondary | ICD-10-CM

## 2021-04-11 DIAGNOSIS — R202 Paresthesia of skin: Secondary | ICD-10-CM

## 2021-04-11 DIAGNOSIS — M4807 Spinal stenosis, lumbosacral region: Secondary | ICD-10-CM

## 2021-04-11 NOTE — Procedures (Signed)
[facility]  [address], [address]  [place], [place] [address] Tel: [phone] Fax:  [phone] Test Date:  04/11/2021  Patient: Tina Briggs DOB: 04-25-1946 Physician: Narda Amber, DO  Sex: Female Height: 5\' 3"  Ref Phys: Narda Amber, DO  ID#: [id number]   Technician:    Patient Complaints: This is a 75 year old female with a history of PLIF at L4-5 and left peroneal nerve decompression (2019) referred for evaluation of left lower leg and foot paresthesias.  NCV & EMG Findings: Extensive electrodiagnostic testing of the left lower extremity and additional studies of the right shows:  Left superficial peroneal sensory response is absent.  Right superficial peroneal and bilateral sural sensory responses are within normal limits. Bilateral peroneal and tibial motor responses are within normal limits. Bilateral tibial H reflex studies are within normal limits. Chronic motor axonal loss changes are seen in the left anterior tibialis and extensor hallucis longus muscles, without accompanied active denervation.  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   There is no evidence of a sensorimotor polyneuropathy or lumbosacral radiculopathy affecting the lower extremities.   ___________________________ Narda Amber, DO    Nerve Conduction Studies Anti Sensory Summary Table   Stim Site NR Peak (ms) Norm Peak (ms) P-T Amp (V) Norm P-T Amp  Left Sup Peroneal Anti Sensory (Ant Lat Mall)  32C  12 cm NR  <4.6  >3  Right Sup Peroneal Anti Sensory (Ant Lat Mall)  32C  12 cm    2.8 <4.6 9.1 >3  Left Sural Anti Sensory (Lat Mall)  32C  Calf    3.3 <4.6 9.3 >3  Right Sural Anti Sensory (Lat Mall)  32C  Calf    3.0 <4.6 8.9 >3   Motor Summary Table   Stim Site NR Onset (ms) Norm Onset (ms) O-P Amp (mV) Norm O-P Amp Site1 Site2 Delta-0 (ms) Dist (cm) Vel (m/s) Norm Vel (m/s)  Left  Peroneal Motor (Ext Dig Brev)  32C  Ankle    4.2 <6.0 3.4 >2.5 B Fib Ankle 7.0 31.0 44 >40  B Fib    11.2  3.1  Poplt B Fib 1.3 7.0 54 >40  Poplt    12.5  3.0         Right Peroneal Motor (Ext Dig Brev)  32C  Ankle    3.4 <6.0 3.4 >2.5 B Fib Ankle 6.6 34.0 52 >40  B Fib    10.0  3.4  Poplt B Fib 1.3 8.0 62 >40  Poplt    11.3  3.4         Left Peroneal TA Motor (Tib Ant)  32C  Fib Head    3.1 <4.5 3.6 >3 Poplit Fib Head 1.1 7.0 64 >40  Poplit    4.2  3.3         Right Peroneal TA Motor (Tib Ant)  32C  Fib Head    2.1 <4.5 3.3 >3 Poplit Fib Head 1.5 7.0 47 >40  Poplit    3.6  3.2         Left Tibial Motor (Abd Hall Brev)  32C  Ankle    3.8 <6.0 4.9 >4 Knee Ankle 8.1 39.0 48 >40  Knee    11.9  2.8         Right Tibial Motor (Abd Hall Brev)  32C  Ankle    3.9 <6.0 7.8 >4 Knee Ankle 7.1 39.0 55 >40  Knee  11.0  4.4          H Reflex Studies   NR H-Lat (ms) Lat Norm (ms) L-R H-Lat (ms)  Left Tibial (Gastroc)  32C     34.56 <35 0.00  Right Tibial (Gastroc)  32C     34.56 <35 0.00   EMG   Side Muscle Ins Act Fibs Psw Fasc Number Recrt Dur Dur. Amp Amp. Poly Poly. Comment  Right GluteusMed Nml Nml Nml Nml Nml Nml Nml Nml Nml Nml Nml Nml N/A  Left Gastroc Nml Nml Nml Nml Nml Nml Nml Nml Nml Nml Nml Nml N/A  Left Flex Dig Long Nml Nml Nml Nml Nml Nml Nml Nml Nml Nml Nml Nml N/A  Left RectFemoris Nml Nml Nml Nml Nml Nml Nml Nml Nml Nml Nml Nml N/A  Left GluteusMed Nml Nml Nml Nml Nml Nml Nml Nml Nml Nml Nml Nml N/A  Right Gastroc Nml Nml Nml Nml Nml Nml Nml Nml Nml Nml Nml Nml N/A  Right AntTibialis Nml Nml Nml Nml Nml Nml Nml Nml Nml Nml Nml Nml N/A  Right RectFemoris Nml Nml Nml Nml Nml Nml Nml Nml Nml Nml Nml Nml N/A  Right Flex Dig Long Nml Nml Nml Nml Nml Nml Nml Nml Nml Nml Nml Nml N/A  Left AntTibialis Nml Nml Nml Nml 1- Rapid Some 1+ Some 1+ Some 1+ N/A  Left ExtHallLong Nml Nml Nml Nml 1- Rapid Some 1+ Some 1+ Some 1+ N/A      Waveforms:

## 2021-04-26 DIAGNOSIS — E119 Type 2 diabetes mellitus without complications: Secondary | ICD-10-CM | POA: Diagnosis not present

## 2021-05-04 DIAGNOSIS — S0591XA Unspecified injury of right eye and orbit, initial encounter: Secondary | ICD-10-CM | POA: Insufficient documentation

## 2021-05-04 DIAGNOSIS — S79911A Unspecified injury of right hip, initial encounter: Secondary | ICD-10-CM | POA: Insufficient documentation

## 2021-05-04 DIAGNOSIS — G629 Polyneuropathy, unspecified: Secondary | ICD-10-CM | POA: Insufficient documentation

## 2021-05-04 DIAGNOSIS — K635 Polyp of colon: Secondary | ICD-10-CM | POA: Insufficient documentation

## 2021-05-04 DIAGNOSIS — M722 Plantar fascial fibromatosis: Secondary | ICD-10-CM | POA: Insufficient documentation

## 2021-05-07 ENCOUNTER — Other Ambulatory Visit: Payer: Self-pay | Admitting: Physician Assistant

## 2021-05-07 DIAGNOSIS — M7989 Other specified soft tissue disorders: Secondary | ICD-10-CM | POA: Diagnosis not present

## 2021-05-07 NOTE — Progress Notes (Signed)
Cardiology Office Note:    Date:  05/08/2021   ID:  Tina Briggs, DOB 14-Dec-1945, MRN [id number]  PCP:  Wenda Low, MD  Cardiologist:  None   Referring MD: Wenda Low, MD   Chief Complaint  Patient presents with   Coronary Artery Disease   Shortness of Breath   Hypertension   Follow-up     History of Present Illness:    Tina Briggs is a 75 y.o. female with a hx of hypertension, hyperlipidemia, diabetes mellitus, referred for follow-up of recent identification of coronary and aortic atherosclerosis.  Recent episode of dyspnea on exertion.  She is doing well today.  She has personal history of hyperlipidemia, hypertension, type 2 diabetes mellitus, and a family history of CAD (mother with CABG at age 4).  She developed ascending cholangitis in April.  Imaging studies done by CT demonstrated both coronary and aortic atherosclerosis.  She is referred to cardiology because of this diagnosis of subclinical coronary disease.  During the hospital stay for which she was treated for sepsis and had to have ERCP, she had no chest discomfort or cardiac symptoms.  In June on a hot afternoon, she was walking on the beach with sand that was very loosely packed.  After walking for several minutes she became extremely short of breath and had to cut her walk short.  She had never felt that sensation before.  It has not occurred since that time.  She has not exerted herself to that extent since then.  She specifically denies any episodes of chest discomfort, back discomfort, arm or neck discomfort.  She denies orthopnea.  No PND.  An echocardiogram performed while hospitalized in April is noted below demonstrating normal LV function.  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 Mild to mod TI scarring. (Dr. Lorrene Reid)   HNP (herniated nucleus pulposus), lumbar    Recurrent   HYPERLIPIDEMIA-MIXED    HYPERTENSION, UNSPECIFIED    Injury of right hip    Lumbar back pain    Microscopic hematuria    Neuropathy    OBESITY    Plantar fasciitis    PONV (postoperative nausea and vomiting)    past history only, "sometimes they put a patch on me"   Right eye injury     Past Surgical History:  Procedure Laterality Date   ABDOMINAL HYSTERECTOMY     APPENDECTOMY     BACK SURGERY     lumbar '09"microdiscectomy"   BREAST BIOPSY     breat Bx benign     CHOLECYSTECTOMY     open   COLONOSCOPY WITH PROPOFOL N/A 11/22/2014   Procedure: COLONOSCOPY WITH PROPOFOL;  Surgeon: Garlan Fair, MD;  Location: [facility];  Service: Endoscopy;  Laterality: N/A;   ERCP N/A 05/26/2015   Procedure: ENDOSCOPIC RETROGRADE CHOLANGIOPANCREATOGRAPHY (ERCP)   (DOING CASE IN MAIN OR);  Surgeon: Teena Irani, MD;  Location: [facility];  Service: Gastroenterology;  Laterality: N/A;   ERCP N/A  06/06/2015   Procedure: ENDOSCOPIC RETROGRADE CHOLANGIOPANCREATOGRAPHY (ERCP);  Surgeon: Clarene Essex, MD;  Location: [facility];  Service: Endoscopy;  Laterality: N/A;   ERCP N/A 01/12/2021   Procedure: ENDOSCOPIC RETROGRADE CHOLANGIOPANCREATOGRAPHY (ERCP);  Surgeon: Clarene Essex, MD;  Location: [facility];  Service: Endoscopy;  Laterality: N/A;   ESOPHAGOGASTRODUODENOSCOPY (EGD) WITH PROPOFOL N/A 11/22/2014   Procedure: ESOPHAGOGASTRODUODENOSCOPY (EGD) WITH PROPOFOL;  Surgeon: Garlan Fair, MD;  Location: [facility];  Service: Endoscopy;  Laterality: N/A;   EYE SURGERY Bilateral    cataracts   GANGLION CYST EXCISION Right    LIPOMA EXCISION  1993   From thigh 1993    LUMBAR FUSION  09/16/2018   LUMBAR LAMINECTOMY/DECOMPRESSION MICRODISCECTOMY Left 12/31/2017   Procedure: Laminectomy and Foraminotomy - Lumbar Three-Four - left Extraforaminal diskectomy Lumbar Four-Five left;  Surgeon: Eustace Moore, MD;  Location: [place];  Service: Neurosurgery;  Laterality: Left;   PERONEAL NERVE DECOMPRESSION Left 12/31/2017   Procedure: Left PERONEAL NERVE DECOMPRESSION;  Surgeon: Eustace Moore, MD;  Location: [facility];  Service: Neurosurgery;  Laterality: Left;  left   PERONEAL NERVE DECOMPRESSION     REMOVAL OF STONES  01/12/2021   Procedure: REMOVAL OF STONES;  Surgeon: Clarene Essex, MD;  Location: [facility];  Service: Endoscopy;;   Right ganglion cyst     Right ganglion cyst remove     TUBAL LIGATION      Current Medications: Current Meds  Medication Sig   acetaminophen (TYLENOL) 500 MG tablet Take 1,000 mg by mouth 2 (two) times daily as needed for moderate pain or headache.    aspirin EC 81 MG tablet Take 81 mg by mouth daily.   cholecalciferol (VITAMIN D) 1000 units tablet Take 3,000 Units by mouth daily.   cholestyramine light (PREVALITE) 4 g packet Take 4 g by mouth every other day.   CRESTOR 40 MG tablet TAKE 1 TABLET AT BEDTIME (Patient taking differently: Take 40 mg by mouth at bedtime.)   dapagliflozin propanediol (FARXIGA) 5 MG TABS tablet Take 5 mg by mouth daily.   desloratadine (CLARINEX) 5 MG tablet Take 5 mg by mouth daily.   famotidine (PEPCID) 40 MG tablet Take 40 mg by mouth 2 (two) times daily as needed for heartburn or indigestion.   fluticasone (FLONASE) 50 MCG/ACT nasal spray Place 2 sprays into both nostrils daily as needed for allergies.    FREESTYLE LITE test strip USE 1 STRIP TO CHECK GLUCOSE 4 TIMES DAILY   gabapentin (NEURONTIN) 300 MG capsule Take 300-600 mg by mouth See admin instructions. '300mg'$  in the morning '600mg'$  at night   losartan (COZAAR) 25 MG tablet Take 25 mg by mouth 2 (two) times daily.   metoprolol tartrate (LOPRESSOR)  100 MG tablet Take one tablet by mouth 2 hours prior to your CT   Multiple Vitamins-Minerals (PRESERVISION AREDS 2 PO) Take 1 tablet by mouth 2 (two) times daily.   NOVOLOG FLEXPEN RELION 100 UNIT/ML FlexPen Inject 14-16 Units into the skin 3 (three) times daily as needed. BS   venlafaxine XR (EFFEXOR-XR) 75 MG 24 hr capsule Take 75 mg by mouth every morning.    vitamin B-12 (CYANOCOBALAMIN) 1000 MCG tablet Take 1,000 mcg by mouth 3 (three) times a week. Take on Sundays, Wednesdays and Fridays.     Allergies:   Codeine, Clavulanic acid, Flagyl [metronidazole], Other, Prevacid [lansoprazole], Tetracyclines & related, Trulicity [dulaglutide], Azithromycin, Erythromycin, Morphine and related, and Septra [sulfamethoxazole-trimethoprim]   Social History   Socioeconomic History   Marital status:  Married    Spouse name: Not on file   Number of children: 2   Years of education: Not on file   Highest education level: Not on file  Occupational History   Occupation: retired  Tobacco Use   Smoking status: Never   Smokeless tobacco: Never  Vaping Use   Vaping Use: Never used  Substance and Sexual Activity   Alcohol use: No   Drug use: No   Sexual activity: Not on file  Other Topics Concern   Not on file  Social History Narrative   Right Handed    Lives in a one story home    Drinks Little Caffeine    Social Determinants of Health   Financial Resource Strain: Not on file  Food Insecurity: Not on file  Transportation Needs: Not on file  Physical Activity: Not on file  Stress: Not on file  Social Connections: Not on file     Family History: The patient's family history includes Alzheimer's disease in her father; Cancer in her maternal aunt, maternal grandmother, and paternal grandmother; Diabetes Mellitus II in her brother; Healthy in her son and son; Heart disease in her mother; Tremor in her father.  ROS:   Please see the history of present illness.    She has recurring episodes  of cholangitis.  Most recent episode April 2022 associated with sepsis.  No cardiac complications during the hospital stay.  She is overweight.  Relatively inactive from a physical standpoint.  She is retired from [employer].  All other systems reviewed and are negative.  EKGs/Labs/Other Studies Reviewed:    The following studies were reviewed today: 2D Doppler echocardiogram April 2022 IMPRESSIONS     1. Left ventricular ejection fraction, by estimation, is 60 to 65%. The  left ventricle has normal function. The left ventricle has no regional  wall motion abnormalities. Left ventricular diastolic parameters are  consistent with Grade II diastolic  dysfunction (pseudonormalization).   2. Right ventricular systolic function is normal. The right ventricular  size is normal. There is normal pulmonary artery systolic pressure.   3. Left atrial size was moderately dilated.   4. The mitral valve is grossly normal. Trivial mitral valve  regurgitation. Moderate mitral annular calcification.   5. Tricuspid valve regurgitation is mild to moderate.   6. The aortic valve is tricuspid. There is mild calcification of the  aortic valve. There is mild thickening of the aortic valve. Aortic valve  regurgitation is not visualized. Mild aortic valve sclerosis is present,  with no evidence of aortic valve  stenosis.   7. The inferior vena cava is dilated in size with >50% respiratory  variability, suggesting right atrial pressure of 8 mmHg.    Chest CT scan 01/12/2021: IMPRESSION: Intraluminal calculi within the central left biliary tree with distal fluid opacification and dilation of the intrahepatic bile ducts within the left hepatic lobe. This may manifest clinically as sepsis secondary to ascending cholangitis.   Mild coronary artery calcification.   Trace interstitial pulmonary edema.   Extensive distal colonic diverticulosis   Aortic Atherosclerosis (ICD10-I70.0).   EKG:  EKG performed January 12, 2021 demonstrates, left axis deviation, early QRS transition.  No acute ischemic change.  No evidence of Q waves.  Recent Labs: 01/15/2021: ALT 102 01/16/2021: Hemoglobin 10.5; Platelets 133 01/17/2021: BUN 27; Creatinine, Ser 1.23; Potassium 4.0; Sodium 142  Recent Lipid Panel No results found for: CHOL, TRIG, HDL, CHOLHDL, VLDL, LDLCALC, LDLDIRECT  Physical Exam:    VS:  BP 108/65   Pulse 75   Ht '5\' 3"'$  (1.6 m)   Wt 177 lb 12.8 oz (80.6 kg)   SpO2 93%   BMI 31.50 kg/m     Wt Readings from Last 3 Encounters:  05/08/21 177 lb 12.8 oz (80.6 kg)  01/25/21 180 lb (81.6 kg)  01/12/21 187 lb 9.8 oz (85.1 kg)     GEN: Obese with BMI 32. No acute distress HEENT: Normal NECK: No JVD. LYMPHATICS: No lymphadenopathy CARDIAC: No murmur. RRR no gallop, or edema. VASCULAR:  Normal Pulses. No bruits. RESPIRATORY:  Clear to auscultation without rales, wheezing or rhonchi  ABDOMEN: Soft, non-tender, non-distended, No pulsatile mass, MUSCULOSKELETAL: No deformity  SKIN: Warm and dry NEUROLOGIC:  Alert and oriented x 3 PSYCHIATRIC:  Normal affect   ASSESSMENT:    1. Coronary artery calcification   2. DOE (dyspnea on exertion)   3. Essential hypertension   4. Hypercholesterolemia   5. Hardening of the aorta (main artery of the heart) ([place])   6. Type 2 diabetes mellitus with complication, with long-term current use of insulin (HCC)    PLAN:    In order of problems listed above:  The episode of dyspnea occurring while walking on a soft sand beach raises the possibility of dyspnea as an anginal equivalent.  Given atherosclerosis and other risk factors as noted in the problem list, I have recommended a coronary CTA with FFR if indicated.  Continue risk factor modification for primary prevention including aggressive lipid control, blood pressure control, and glycemia control, and physical activity. Rule out diastolic heart failure.  Patient is already on [place].  Echocardiogram has been  done and demonstrates grade 2 diastolic dysfunction.  EF is 65%.  If no significant obstruction is noted on coronary CTA, dyspnea is likely a manifestation of heart failure.  Current therapy is adequate.  Needs to improve physical conditioning and lose weight. 130/80 mmHg or less. Target LDL to 55 or less.  Last LDL was 73 in 2021.  Blood work is upcoming.  If she is still above 55, I would recommend adding Zetia to max dose rosuvastatin. Primary prevention as noted below. Optimized SGLT2 therapy.  A1c is already less than 7.  Weight loss and diet control also recommended.    Overall education and awareness concerning primary/secondary risk prevention was discussed in detail: LDL less than 70, hemoglobin A1c less than 7, blood pressure target less than 130/80 mmHg, >150 minutes of moderate aerobic activity per week, avoidance of smoking, weight control (via diet and exercise), and continued surveillance/management of/for obstructive sleep apnea.    Medication Adjustments/Labs and Tests Ordered: Current medicines are reviewed at length with the patient today.  Concerns regarding medicines are outlined above.  Orders Placed This Encounter  Procedures   CT CORONARY MORPH W/CTA COR W/SCORE W/CA W/CM &/OR WO/CM   Basic metabolic panel    Meds ordered this encounter  Medications   metoprolol tartrate (LOPRESSOR) 100 MG tablet    Sig: Take one tablet by mouth 2 hours prior to your CT    Dispense:  1 tablet    Refill:  0     Patient Instructions  Medication Instructions:  Your physician recommends that you continue on your current medications as directed. Please refer to the Current Medication list given to you today.  *If you need a refill on your cardiac medications before your next appointment, please call your pharmacy*   Lab Work: BMET today  If you have labs (blood  work) drawn today and your tests are completely normal, you will receive your results only by: [place] (if  you have MyChart) OR A paper copy in the mail If you have any lab test that is abnormal or we need to change your treatment, we will call you to review the results.   Testing/Procedures: Your physician recommends that you have a Coronary CT performed.  See instructions below.   Follow-Up: At [facility], you and your health needs are our priority.  As part of our continuing mission to provide you with exceptional heart care, we have created designated Provider Care Teams.  These Care Teams include your primary Cardiologist (physician) and Advanced Practice Providers (APPs -  Physician Assistants and Nurse Practitioners) who all work together to provide you with the care you need, when you need it.  We recommend signing up for the patient portal called "MyChart".  Sign up information is provided on this After Visit Summary.  MyChart is used to connect with patients for Virtual Visits (Telemedicine).  Patients are able to view lab/test results, encounter notes, upcoming appointments, etc.  Non-urgent messages can be sent to your provider as well.   To learn more about what you can do with MyChart, go to [email or web address].    Your next appointment:   As needed  The format for your next appointment:   In Person  Provider:   You may see Daneen Schick, MD or one of the following Advanced Practice Providers on your designated Care Team:   Cecilie Kicks, NP   Other Instructions    Your cardiac CT will be scheduled at one of the below locations:   [facility] [address] [place], [place] [address] [phone]  [place] [address] [place],  [address] [phone]  If scheduled at [facility], please arrive at the [facility] main entrance (entrance A) of [facility] 30 minutes prior to test start time. Proceed to the [facility] Radiology Department (first floor) to check-in and  test prep.  If scheduled at [facility], please arrive 15 mins early for check-in and test prep.  Please follow these instructions carefully (unless otherwise directed):  Hold all erectile dysfunction medications at least 3 days (72 hrs) prior to test.  On the Night Before the Test: Be sure to Drink plenty of water. Do not consume any caffeinated/decaffeinated beverages or chocolate 12 hours prior to your test. Do not take any antihistamines 12 hours prior to your test.   On the Day of the Test: Drink plenty of water until 1 hour prior to the test. Do not eat any food 4 hours prior to the test. You may take your regular medications prior to the test.  Take metoprolol (Lopressor) two hours prior to test. HOLD Furosemide/Hydrochlorothiazide morning of the test. Hold your Amlodipine the morning of your test. FEMALES- please wear underwire-free bra if available, avoid dresses & tight clothing       After the Test: Drink plenty of water. After receiving IV contrast, you may experience a mild flushed feeling. This is normal. On occasion, you may experience a mild rash up to 24 hours after the test. This is not dangerous. If this occurs, you can take Benadryl 25 mg and increase your fluid intake. If you experience trouble breathing, this can be serious. If it is severe call 911 IMMEDIATELY. If it is mild, please call our office. If  you take any of these medications: Glipizide/Metformin, Avandament, Glucavance, please do not take 48 hours after completing test unless otherwise instructed.  Please allow 2-4 weeks for scheduling of routine cardiac CTs. Some insurance companies require a pre-authorization which may delay scheduling of this test.   For non-scheduling related questions, please contact the cardiac imaging nurse navigator should you have any questions/concerns: Marchia Bond, Cardiac Imaging Nurse Navigator Gordy Clement, Cardiac Imaging Nurse  Navigator [facility] Direct Office Dial: [phone]   For scheduling needs, including cancellations and rescheduling, please call [place], [phone].     Signed, Sinclair Grooms, MD  05/08/2021 10:09 AM    [place]

## 2021-05-08 ENCOUNTER — Encounter (INDEPENDENT_AMBULATORY_CARE_PROVIDER_SITE_OTHER): Payer: PPO | Admitting: Ophthalmology

## 2021-05-08 ENCOUNTER — Ambulatory Visit: Payer: PPO | Admitting: Interventional Cardiology

## 2021-05-08 ENCOUNTER — Encounter: Payer: Self-pay | Admitting: Interventional Cardiology

## 2021-05-08 ENCOUNTER — Ambulatory Visit
Admission: RE | Admit: 2021-05-08 | Discharge: 2021-05-08 | Disposition: A | Discharge status: Home / Self Care | Payer: PPO | Source: Ambulatory Visit | Attending: Physician Assistant | Admitting: Physician Assistant

## 2021-05-08 ENCOUNTER — Other Ambulatory Visit: Payer: Self-pay

## 2021-05-08 VITALS — BP 108/65 | HR 75 | Ht 63.0 in | Wt 177.8 lb

## 2021-05-08 DIAGNOSIS — E119 Type 2 diabetes mellitus without complications: Secondary | ICD-10-CM

## 2021-05-08 DIAGNOSIS — R0600 Dyspnea, unspecified: Secondary | ICD-10-CM | POA: Diagnosis not present

## 2021-05-08 DIAGNOSIS — I700 Atherosclerosis of aorta: Secondary | ICD-10-CM | POA: Diagnosis not present

## 2021-05-08 DIAGNOSIS — M7122 Synovial cyst of popliteal space [Baker], left knee: Secondary | ICD-10-CM | POA: Diagnosis not present

## 2021-05-08 DIAGNOSIS — H35033 Hypertensive retinopathy, bilateral: Secondary | ICD-10-CM

## 2021-05-08 DIAGNOSIS — I10 Essential (primary) hypertension: Secondary | ICD-10-CM | POA: Diagnosis not present

## 2021-05-08 DIAGNOSIS — I2510 Atherosclerotic heart disease of native coronary artery without angina pectoris: Secondary | ICD-10-CM

## 2021-05-08 DIAGNOSIS — R0609 Other forms of dyspnea: Secondary | ICD-10-CM

## 2021-05-08 DIAGNOSIS — H353132 Nonexudative age-related macular degeneration, bilateral, intermediate dry stage: Secondary | ICD-10-CM

## 2021-05-08 DIAGNOSIS — Z794 Long term (current) use of insulin: Secondary | ICD-10-CM | POA: Diagnosis not present

## 2021-05-08 DIAGNOSIS — E7800 Pure hypercholesterolemia, unspecified: Secondary | ICD-10-CM

## 2021-05-08 DIAGNOSIS — E118 Type 2 diabetes mellitus with unspecified complications: Secondary | ICD-10-CM

## 2021-05-08 DIAGNOSIS — I2584 Coronary atherosclerosis due to calcified coronary lesion: Secondary | ICD-10-CM

## 2021-05-08 DIAGNOSIS — H3581 Retinal edema: Secondary | ICD-10-CM

## 2021-05-08 DIAGNOSIS — Z961 Presence of intraocular lens: Secondary | ICD-10-CM

## 2021-05-08 DIAGNOSIS — M7989 Other specified soft tissue disorders: Secondary | ICD-10-CM | POA: Diagnosis not present

## 2021-05-08 DIAGNOSIS — I808 Phlebitis and thrombophlebitis of other sites: Secondary | ICD-10-CM | POA: Diagnosis not present

## 2021-05-08 DIAGNOSIS — I8002 Phlebitis and thrombophlebitis of superficial vessels of left lower extremity: Secondary | ICD-10-CM | POA: Diagnosis not present

## 2021-05-08 LAB — BASIC METABOLIC PANEL
BUN/Creatinine Ratio: 19 (ref 12–28)
BUN: 22 mg/dL (ref 8–27)
CO2: 22 mmol/L (ref 20–29)
Calcium: 9.2 mg/dL (ref 8.7–10.3)
Chloride: 104 mmol/L (ref 96–106)
Creatinine, Ser: 1.16 mg/dL — ABNORMAL HIGH (ref 0.57–1.00)
Glucose: 135 mg/dL — ABNORMAL HIGH (ref 65–99)
Potassium: 5.1 mmol/L (ref 3.5–5.2)
Sodium: 142 mmol/L (ref 134–144)
eGFR: 49 mL/min/{1.73_m2} — ABNORMAL LOW (ref >59)

## 2021-05-08 MED ORDER — METOPROLOL TARTRATE 100 MG PO TABS: ORAL_TABLET | ORAL | 0 refills | Status: DC

## 2021-05-08 NOTE — Patient Instructions (Addendum)
Medication Instructions:  Your physician recommends that you continue on your current medications as directed. Please refer to the Current Medication list given to you today.  *If you need a refill on your cardiac medications before your next appointment, please call your pharmacy*   Lab Work: BMET today  If you have labs (blood work) drawn today and your tests are completely normal, you will receive your results only by: [place] (if you have MyChart) OR A paper copy in the mail If you have any lab test that is abnormal or we need to change your treatment, we will call you to review the results.   Testing/Procedures: Your physician recommends that you have a Coronary CT performed.  See instructions below.   Follow-Up: At [facility], you and your health needs are our priority.  As part of our continuing mission to provide you with exceptional heart care, we have created designated Provider Care Teams.  These Care Teams include your primary Cardiologist (physician) and Advanced Practice Providers (APPs -  Physician Assistants and Nurse Practitioners) who all work together to provide you with the care you need, when you need it.  We recommend signing up for the patient portal called "MyChart".  Sign up information is provided on this After Visit Summary.  MyChart is used to connect with patients for Virtual Visits (Telemedicine).  Patients are able to view lab/test results, encounter notes, upcoming appointments, etc.  Non-urgent messages can be sent to your provider as well.   To learn more about what you can do with MyChart, go to [email or web address].    Your next appointment:   As needed  The format for your next appointment:   In Person  Provider:   You may see Daneen Schick, MD or one of the following Advanced Practice Providers on your designated Care Team:   Cecilie Kicks, NP   Other Instructions    Your cardiac CT will be scheduled at one of the below  locations:   [facility] [address] [place], [place] [address] [phone]  [place] [address] [place], [place] [address] [phone]  If scheduled at [facility], please arrive at the [facility] main entrance (entrance A) of [facility] 30 minutes prior to test start time. Proceed to the [facility] Radiology Department (first floor) to check-in and test prep.  If scheduled at [facility], please arrive 15 mins early for check-in and test prep.  Please follow these instructions carefully (unless otherwise directed):  Hold all erectile dysfunction medications at least 3 days (72 hrs) prior to test.  On the Night Before the Test: Be sure to Drink plenty of water. Do not consume any caffeinated/decaffeinated beverages or chocolate 12 hours prior to your test. Do not take any antihistamines 12 hours prior to your test.   On the Day of the Test: Drink plenty of water until 1 hour prior to the test. Do not eat any food 4 hours prior to the test. You may take your regular medications prior to the test.  Take metoprolol (Lopressor) two hours prior to test. HOLD Furosemide/Hydrochlorothiazide morning of the test. Hold your Amlodipine the morning of your test. FEMALES- please wear underwire-free bra if available, avoid dresses & tight clothing       After the Test: Drink plenty of water. After receiving IV contrast, you may experience a mild flushed feeling. This is normal. On occasion, you  may experience a mild rash up to 24 hours after the test. This is not dangerous. If this occurs, you can take Benadryl 25 mg and increase your fluid intake. If you experience trouble breathing, this can be serious. If it is severe call 911 IMMEDIATELY. If it is mild, please call our office. If you take any of these medications: Glipizide/Metformin, Avandament, Glucavance, please  do not take 48 hours after completing test unless otherwise instructed.  Please allow 2-4 weeks for scheduling of routine cardiac CTs. Some insurance companies require a pre-authorization which may delay scheduling of this test.   For non-scheduling related questions, please contact the cardiac imaging nurse navigator should you have any questions/concerns: Marchia Bond, Cardiac Imaging Nurse Navigator Gordy Clement, Cardiac Imaging Nurse Navigator [place] [facility] Direct Office Dial: [phone]   For scheduling needs, including cancellations and rescheduling, please call [place], [phone].

## 2021-05-10 NOTE — Progress Notes (Signed)
[facility] Note  05/14/2021     CHIEF COMPLAINT Patient presents for Retina Follow Up   HISTORY OF PRESENT ILLNESS: Tina Briggs is a 75 y.o. female who presents to the clinic today for:  HPI     Retina Follow Up   Patient presents with  Dry AMD.  In both eyes.  This started months ago.  Severity is mild.  Duration of 6 months.  Since onset it is stable.  I, the attending physician,  performed the HPI with the patient and updated documentation appropriately.        Comments   75 y/o female pt here for 6 mo f/u for non-exu ARMD OU.  No change in DVA OU; feels she may need a stronger add power for near viewing.  Denies pain, FOL, floaters.  AT prn OU.  BS 121 today.  A1C 6.7.      Last edited by Bernarda Caffey, MD on 05/14/2021  9:53 PM.      Referring physician: Wenda Low, MD [address] [place],  [place] [address]  HISTORICAL INFORMATION:   Selected notes from the MEDICAL RECORD NUMBER Referred by Dr. Kathrin Penner to eval AMD OD LEE: 10.18.21 Ocular Hx- AMD OD   CURRENT MEDICATIONS: No current outpatient medications on file. (Ophthalmic Drugs)   No current facility-administered medications for this visit. (Ophthalmic Drugs)   Current Outpatient Medications (Other)  Medication Sig   acetaminophen (TYLENOL) 500 MG tablet Take 1,000 mg by mouth 2 (two) times daily as needed for moderate pain or headache.    aspirin EC 81 MG tablet Take 81 mg by mouth daily.   cholecalciferol (VITAMIN D) 1000 units tablet Take 3,000 Units by mouth daily.   cholestyramine light (PREVALITE) 4 g packet Take 4 g by mouth every other day.   CRESTOR 40 MG tablet TAKE 1 TABLET AT BEDTIME (Patient taking differently: Take 40 mg by mouth at bedtime.)   dapagliflozin propanediol (FARXIGA) 5 MG TABS tablet Take 5 mg by mouth daily.   desloratadine (CLARINEX) 5 MG tablet Take 5 mg by mouth daily.   famotidine (PEPCID) 40 MG tablet Take 40 mg by  mouth 2 (two) times daily as needed for heartburn or indigestion.   fluticasone (FLONASE) 50 MCG/ACT nasal spray Place 2 sprays into both nostrils daily as needed for allergies.    FREESTYLE LITE test strip USE 1 STRIP TO CHECK GLUCOSE 4 TIMES DAILY   gabapentin (NEURONTIN) 300 MG capsule Take 300-600 mg by mouth See admin instructions. '300mg'$  in the morning '600mg'$  at night   losartan (COZAAR) 25 MG tablet Take 25 mg by mouth 2 (two) times daily.   metoprolol tartrate (LOPRESSOR) 100 MG tablet Take one tablet by mouth 2 hours prior to your CT   Multiple Vitamins-Minerals (PRESERVISION AREDS 2 PO) Take 1 tablet by mouth 2 (two) times daily.   NOVOLOG FLEXPEN RELION 100 UNIT/ML FlexPen Inject 14-16 Units into the skin 3 (three) times daily as needed. BS   venlafaxine XR (EFFEXOR-XR) 75 MG 24 hr capsule Take 75 mg by mouth every morning.    vitamin B-12 (CYANOCOBALAMIN) 1000 MCG tablet Take 1,000 mcg by mouth 3 (three) times a week. Take on Sundays, Wednesdays and Fridays.   No current facility-administered medications for this visit. (Other)      REVIEW OF SYSTEMS: ROS   Positive for: Gastrointestinal, Neurological, Genitourinary, Musculoskeletal, Endocrine, Cardiovascular, Eyes Negative for: Constitutional, Skin, HENT, Respiratory, Psychiatric, Allergic/Imm,  Heme/Lymph Last edited by Matthew Folks, COA on 05/14/2021  2:41 PM.      ALLERGIES Allergies  Allergen Reactions   Codeine Shortness Of Breath   Clavulanic Acid    Flagyl [Metronidazole]    Other Other (See Comments)   Prevacid [Lansoprazole]    Tetracyclines & Related Itching   Trulicity [Dulaglutide]    Azithromycin Itching and Rash   Erythromycin Itching and Rash   Morphine And Related Itching and Rash   Septra [Sulfamethoxazole-Trimethoprim] Itching and Rash    PAST MEDICAL HISTORY Past Medical History:  Diagnosis Date   Allergic rhinitis    Anemia    Anxiety    Aspiration pneumonia (HCC)    after common bile  duct obstruction   CATARACT, RIGHT EYE    had repaired   Colon polyps    Depression    Diverticulitis    recurrent episodes   DM    Dyslipidemia    Elevated alkaline phosphatase level    GERD    GERD (gastroesophageal reflux disease)    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 Mild to mod TI scarring. (Dr. Lorrene Reid)   HNP (herniated nucleus pulposus), lumbar    Recurrent   HYPERLIPIDEMIA-MIXED    HYPERTENSION, UNSPECIFIED    Hypertensive retinopathy    Injury of right hip    Lumbar back pain    Macular degeneration    Microscopic hematuria    Neuropathy    OBESITY    Plantar fasciitis    PONV (postoperative nausea and vomiting)    past history only, "sometimes they put a patch on me"   Right eye injury    Past Surgical History:  Procedure Laterality Date   ABDOMINAL HYSTERECTOMY     APPENDECTOMY     BACK SURGERY     lumbar '09"microdiscectomy"   BREAST BIOPSY     breat Bx benign     CHOLECYSTECTOMY     open   COLONOSCOPY WITH PROPOFOL N/A 11/22/2014   Procedure: COLONOSCOPY WITH PROPOFOL;  Surgeon: Garlan Fair, MD;  Location: [facility];  Service: Endoscopy;  Laterality: N/A;   ERCP N/A 05/26/2015   Procedure: ENDOSCOPIC RETROGRADE CHOLANGIOPANCREATOGRAPHY (ERCP)   (DOING CASE IN MAIN OR);  Surgeon: Teena Irani, MD;  Location: [facility];  Service: Gastroenterology;  Laterality: N/A;   ERCP N/A 06/06/2015   Procedure: ENDOSCOPIC RETROGRADE CHOLANGIOPANCREATOGRAPHY (ERCP);  Surgeon: Clarene Essex, MD;  Location: [facility];  Service: Endoscopy;  Laterality: N/A;   ERCP N/A 01/12/2021   Procedure: ENDOSCOPIC RETROGRADE CHOLANGIOPANCREATOGRAPHY (ERCP);  Surgeon: Clarene Essex, MD;  Location: [facility];  Service: Endoscopy;  Laterality: N/A;   ESOPHAGOGASTRODUODENOSCOPY (EGD) WITH PROPOFOL N/A 11/22/2014   Procedure: ESOPHAGOGASTRODUODENOSCOPY (EGD) WITH PROPOFOL;  Surgeon: Garlan Fair, MD;  Location: [facility];  Service: Endoscopy;  Laterality: N/A;   EYE SURGERY Bilateral    cataracts   GANGLION CYST EXCISION Right    LIPOMA EXCISION  1993   From thigh 1993   LUMBAR FUSION  09/16/2018   LUMBAR LAMINECTOMY/DECOMPRESSION MICRODISCECTOMY Left 12/31/2017   Procedure: Laminectomy and Foraminotomy - Lumbar Three-Four - left Extraforaminal diskectomy Lumbar Four-Five left;  Surgeon: Eustace Moore, MD;  Location: [facility];  Service: Neurosurgery;  Laterality: Left;   PERONEAL NERVE DECOMPRESSION Left 12/31/2017   Procedure: Left PERONEAL NERVE DECOMPRESSION;  Surgeon: Eustace Moore, MD;  Location: [facility];  Service: Neurosurgery;  Laterality: Left;  left   PERONEAL NERVE DECOMPRESSION  REMOVAL OF STONES  01/12/2021   Procedure: REMOVAL OF STONES;  Surgeon: Clarene Essex, MD;  Location: [facility];  Service: Endoscopy;;   Right ganglion cyst     Right ganglion cyst remove     TUBAL LIGATION      FAMILY HISTORY Family History  Problem Relation Age of Onset   Cancer Maternal Aunt        breast cancer   Cancer Maternal Grandmother        kidney cancer    Cancer Paternal Grandmother        bone cancer    Heart disease Mother    Alzheimer's disease Father    Tremor Father        possible PD   Diabetes Mellitus II Brother    Healthy Son    Healthy Son     SOCIAL HISTORY Social History   Tobacco Use   Smoking status: Never   Smokeless tobacco: Never  Vaping Use   Vaping Use: Never used  Substance Use Topics   Alcohol use: No   Drug use: No         OPHTHALMIC EXAM:  Base Eye Exam     Visual Acuity (Snellen - Linear)       Right Left   Dist cc 20/25 20/20   Dist ph cc NI     Correction: Glasses         Tonometry (Tonopen, 2:44 PM)       Right Left   Pressure 15 11         Pupils       Dark Light Shape React APD   Right 3 2 Round Brisk None   Left 3 2 Round Brisk None         Visual Fields (Counting fingers)       Left Right    Full Full          Extraocular Movement       Right Left    Full, Ortho Full, Ortho         Neuro/Psych     Oriented x3: Yes   Mood/Affect: Normal         Dilation     Both eyes: 1.0% Mydriacyl, 2.5% Phenylephrine @ 2:44 PM           Slit Lamp and Fundus Exam     Slit Lamp Exam       Right Left   Lids/Lashes Dermatochalasis - upper lid Dermatochalasis - upper lid   Conjunctiva/Sclera White and quiet temporal pinguecula   Cornea Mild arcus, trace Punctate epithelial erosions, tear film debris, well healed temporal cataract wounds Mild arcus, trace, fine Punctate epithelial erosions, tear film debris, well healed temporal cataract wounds   Anterior Chamber Deep and quiet Deep and quiet   Iris Round and moderately dilated Round and moderately dilated   Lens PC IOL in good position with open PC PC IOL in good position with open PC   Vitreous Vitreous syneresis, Posterior vitreous detachment Mild Vitreous syneresis, Posterior vitreous detachment, vitreous condensations         Fundus Exam       Right Left   Disc Pink and Sharp, Compact Pink and Sharp, Compact   C/D Ratio 0.4 0.4   Macula Flat, Blunted foveal reflex, Drusen, RPE mottling and clumping, focal pigment clump IN fovea, No heme or edema Flat, Blunted foveal reflex, Drusen, RPE mottling and clumping, focal pigment clump nasal fovea, No heme or  edema   Vessels attenuated, Tortuous attenuated, mild tortuousity, mild AV crossing changes   Periphery Attached, reticular degeneration greatest nasally, No heme  Attached, reticular degeneration greatest nasally, No heme             IMAGING AND PROCEDURES  Imaging and Procedures for 05/14/2021  OCT, Retina - OU - Both Eyes       Right Eye Quality was good. Central Foveal Thickness: 307. Progression has been stable. Findings include normal foveal contour, no IRF, no SRF, retinal drusen (Focal RPE irregularity IN fovea).   Left Eye Quality was good. Central Foveal  Thickness: 305. Progression has been stable. Findings include normal foveal contour, no IRF, no SRF, retinal drusen , intraretinal hyper-reflective material (Focal RPE irregularity nasal fovea).   Notes *Images captured and stored on drive  Diagnosis / Impression:  Non-exu ARMD with drusen and focal RPE disruptions OU -- no significant change from prior  Clinical management:  See below  Abbreviations: NFP - Normal foveal profile. CME - cystoid macular edema. PED - pigment epithelial detachment. IRF - intraretinal fluid. SRF - subretinal fluid. EZ - ellipsoid zone. ERM - epiretinal membrane. ORA - outer retinal atrophy. ORT - outer retinal tubulation. SRHM - subretinal hyper-reflective material. IRHM - intraretinal hyper-reflective material            ASSESSMENT/PLAN:    ICD-10-CM   1. Intermediate stage nonexudative age-related macular degeneration of both eyes  H35.3132     2. Retinal edema  H35.81 OCT, Retina - OU - Both Eyes    3. Essential hypertension  I10     4. Hypertensive retinopathy of both eyes  H35.033     5. Diabetes mellitus type 2 without retinopathy ([place])  E11.9     6. Pseudophakia of both eyes  Z96.1       1,2. Age related macular degeneration, non-exudative, OU  - intermediate stage w/ drusen and focal RPE disruptions OU  - The incidence, anatomy, and pathology of dry AMD, risk of progression, and the AREDS and AREDS 2 studies including smoking risks discussed with patient.  - started on AREDS 2 supplements and [place] Grid monitoring by Dr. Kathrin Penner -- continue  - BCVA stable at 20/25 OD, 20/20 OS  - OCT without significant change or exudative disease OU  - f/u 9 months, DFE, OCT  3,4. Hypertensive retinopathy OU - discussed importance of tight BP control - monitor  5. Diabetes mellitus, type 2 without retinopathy  - A1c was 6.8 on 2.15.22 - The incidence, risk factors for progression, natural history and treatment options for diabetic  retinopathy  were discussed with patient.   - The need for close monitoring of blood glucose, blood pressure, and serum lipids, avoiding cigarette or any type of tobacco, and the need for long term follow up was also discussed with patient. - f/u in 1 year, sooner prn  6. Pseudophakia OU  - s/p CE/IOL OU  - IOL in good position, doing well  - monitor  Ophthalmic Meds Ordered this visit:  No orders of the defined types were placed in this encounter.     Return in about 9 months (around 02/11/2022) for non exu ARMD OU, DFE/OCT.  There are no Patient Instructions on file for this visit.  This document serves as a record of services personally performed by Gardiner Sleeper, MD, PhD. It was created on their behalf by Leeann Must, [place], an ophthalmic technician. The creation of this record is the provider's dictation  and/or activities during the visit.    Electronically signed by: Leeann Must, COA '@TODAY'$ @ 9:57 PM  Gardiner Sleeper, M.D., Ph.D. Diseases & Surgery of the Retina and [place] 05/14/2021   I have reviewed the above documentation for accuracy and completeness, and I agree with the above. Gardiner Sleeper, M.D., Ph.D. 05/14/21 9:57 PM   Abbreviations: M myopia (nearsighted); A astigmatism; H hyperopia (farsighted); P presbyopia; Mrx spectacle prescription;  CTL contact lenses; OD right eye; OS left eye; OU both eyes  XT exotropia; ET esotropia; PEK punctate epithelial keratitis; PEE punctate epithelial erosions; DES dry eye syndrome; MGD meibomian gland dysfunction; ATs artificial tears; PFAT's preservative free artificial tears; [place] nuclear sclerotic cataract; PSC posterior subcapsular cataract; ERM epi-retinal membrane; PVD posterior vitreous detachment; RD retinal detachment; DM diabetes mellitus; DR diabetic retinopathy; NPDR non-proliferative diabetic retinopathy; PDR proliferative diabetic retinopathy; CSME clinically significant macular  edema; DME diabetic macular edema; dbh dot blot hemorrhages; CWS cotton wool spot; POAG primary open angle glaucoma; C/D cup-to-disc ratio; HVF humphrey visual field; GVF goldmann visual field; OCT optical coherence tomography; IOP intraocular pressure; BRVO Branch retinal vein occlusion; CRVO central retinal vein occlusion; CRAO central retinal artery occlusion; BRAO branch retinal artery occlusion; RT retinal tear; SB scleral buckle; PPV pars plana vitrectomy; VH Vitreous hemorrhage; PRP panretinal laser photocoagulation; IVK intravitreal kenalog; VMT vitreomacular traction; MH Macular hole;  NVD neovascularization of the disc; NVE neovascularization elsewhere; AREDS age related eye disease study; ARMD age related macular degeneration; POAG primary open angle glaucoma; EBMD epithelial/anterior basement membrane dystrophy; ACIOL anterior chamber intraocular lens; IOL intraocular lens; PCIOL posterior chamber intraocular lens; Phaco/IOL phacoemulsification with intraocular lens placement; [place] photorefractive keratectomy; LASIK laser assisted in situ keratomileusis; HTN hypertension; DM diabetes mellitus; COPD chronic obstructive pulmonary disease

## 2021-05-14 ENCOUNTER — Ambulatory Visit (INDEPENDENT_AMBULATORY_CARE_PROVIDER_SITE_OTHER): Payer: PPO | Admitting: Ophthalmology

## 2021-05-14 ENCOUNTER — Encounter (INDEPENDENT_AMBULATORY_CARE_PROVIDER_SITE_OTHER): Payer: Self-pay | Admitting: Ophthalmology

## 2021-05-14 ENCOUNTER — Other Ambulatory Visit: Payer: Self-pay

## 2021-05-14 DIAGNOSIS — H353132 Nonexudative age-related macular degeneration, bilateral, intermediate dry stage: Secondary | ICD-10-CM

## 2021-05-14 DIAGNOSIS — Z961 Presence of intraocular lens: Secondary | ICD-10-CM | POA: Diagnosis not present

## 2021-05-14 DIAGNOSIS — I10 Essential (primary) hypertension: Secondary | ICD-10-CM | POA: Diagnosis not present

## 2021-05-14 DIAGNOSIS — K219 Gastro-esophageal reflux disease without esophagitis: Secondary | ICD-10-CM | POA: Diagnosis not present

## 2021-05-14 DIAGNOSIS — H3581 Retinal edema: Secondary | ICD-10-CM

## 2021-05-14 DIAGNOSIS — E119 Type 2 diabetes mellitus without complications: Secondary | ICD-10-CM | POA: Diagnosis not present

## 2021-05-14 DIAGNOSIS — F322 Major depressive disorder, single episode, severe without psychotic features: Secondary | ICD-10-CM | POA: Diagnosis not present

## 2021-05-14 DIAGNOSIS — E782 Mixed hyperlipidemia: Secondary | ICD-10-CM | POA: Diagnosis not present

## 2021-05-14 DIAGNOSIS — H35033 Hypertensive retinopathy, bilateral: Secondary | ICD-10-CM | POA: Diagnosis not present

## 2021-05-14 DIAGNOSIS — E1122 Type 2 diabetes mellitus with diabetic chronic kidney disease: Secondary | ICD-10-CM | POA: Diagnosis not present

## 2021-05-14 DIAGNOSIS — M1990 Unspecified osteoarthritis, unspecified site: Secondary | ICD-10-CM | POA: Diagnosis not present

## 2021-05-14 DIAGNOSIS — I2510 Atherosclerotic heart disease of native coronary artery without angina pectoris: Secondary | ICD-10-CM | POA: Diagnosis not present

## 2021-05-14 DIAGNOSIS — D509 Iron deficiency anemia, unspecified: Secondary | ICD-10-CM | POA: Diagnosis not present

## 2021-05-14 DIAGNOSIS — N1831 Chronic kidney disease, stage 3a: Secondary | ICD-10-CM | POA: Diagnosis not present

## 2021-05-14 NOTE — Addendum Note (Signed)
Addended byDanielle Dess on: 05/14/2021 11:48 AM   Modules accepted: Orders

## 2021-05-16 DIAGNOSIS — I700 Atherosclerosis of aorta: Secondary | ICD-10-CM | POA: Diagnosis not present

## 2021-05-16 DIAGNOSIS — N1832 Chronic kidney disease, stage 3b: Secondary | ICD-10-CM | POA: Diagnosis not present

## 2021-05-16 DIAGNOSIS — Z794 Long term (current) use of insulin: Secondary | ICD-10-CM | POA: Diagnosis not present

## 2021-05-16 DIAGNOSIS — E1122 Type 2 diabetes mellitus with diabetic chronic kidney disease: Secondary | ICD-10-CM | POA: Diagnosis not present

## 2021-05-18 ENCOUNTER — Telehealth (HOSPITAL_COMMUNITY): Payer: Self-pay | Admitting: Emergency Medicine

## 2021-05-18 NOTE — Telephone Encounter (Signed)
Reaching out to patient to offer assistance regarding upcoming cardiac imaging study; pt verbalizes understanding of appt date/time, parking situation and where to check in, pre-test NPO status and medications ordered, and verified current allergies; name and call back number provided for further questions should they arise Tina Bond RN Navigator Cardiac Imaging [facility] [phone] office [phone] cell  Hold allergy meds Prefers L arm IVs, better site Denies claustro '100mg'$  metoprolol tart

## 2021-05-21 ENCOUNTER — Other Ambulatory Visit: Payer: Self-pay

## 2021-05-21 ENCOUNTER — Ambulatory Visit (HOSPITAL_COMMUNITY)
Admission: RE | Admit: 2021-05-21 | Discharge: 2021-05-21 | Disposition: A | Discharge status: Home / Self Care | Payer: PPO | Source: Ambulatory Visit | Attending: Interventional Cardiology | Admitting: Interventional Cardiology

## 2021-05-21 DIAGNOSIS — K449 Diaphragmatic hernia without obstruction or gangrene: Secondary | ICD-10-CM | POA: Insufficient documentation

## 2021-05-21 DIAGNOSIS — I2510 Atherosclerotic heart disease of native coronary artery without angina pectoris: Secondary | ICD-10-CM | POA: Insufficient documentation

## 2021-05-21 DIAGNOSIS — R0600 Dyspnea, unspecified: Secondary | ICD-10-CM | POA: Insufficient documentation

## 2021-05-21 DIAGNOSIS — I700 Atherosclerosis of aorta: Secondary | ICD-10-CM | POA: Diagnosis not present

## 2021-05-21 DIAGNOSIS — R0609 Other forms of dyspnea: Secondary | ICD-10-CM

## 2021-05-21 MED ORDER — IOHEXOL 350 MG/ML SOLN
95.0000 mL | Freq: Once | INTRAVENOUS | Status: AC | PRN
  Administered 2021-05-21: 95 mL via INTRAVENOUS

## 2021-05-21 MED ORDER — NITROGLYCERIN 0.4 MG SL SUBL
SUBLINGUAL_TABLET | SUBLINGUAL | Status: AC
  Filled 2021-05-21: qty 1

## 2021-05-21 MED ORDER — METOPROLOL TARTRATE 5 MG/5ML IV SOLN
INTRAVENOUS | Status: AC
  Filled 2021-05-21: qty 5

## 2021-05-21 MED ORDER — NITROGLYCERIN 0.4 MG SL SUBL
0.8000 mg | SUBLINGUAL_TABLET | Freq: Once | SUBLINGUAL | Status: AC
  Administered 2021-05-21: 0.8 mg via SUBLINGUAL

## 2021-05-21 MED ORDER — NITROGLYCERIN 0.4 MG SL SUBL
SUBLINGUAL_TABLET | SUBLINGUAL | Status: AC
  Filled 2021-05-21: qty 2

## 2021-05-21 NOTE — Progress Notes (Signed)
CT scan completed. Tolerated well. D/C home ambulatory. Awake and alert. In no distress 

## 2021-05-28 DIAGNOSIS — E119 Type 2 diabetes mellitus without complications: Secondary | ICD-10-CM | POA: Diagnosis not present

## 2021-06-05 DIAGNOSIS — K635 Polyp of colon: Secondary | ICD-10-CM | POA: Diagnosis not present

## 2021-06-05 DIAGNOSIS — M519 Unspecified thoracic, thoracolumbar and lumbosacral intervertebral disc disorder: Secondary | ICD-10-CM | POA: Diagnosis not present

## 2021-06-05 DIAGNOSIS — E782 Mixed hyperlipidemia: Secondary | ICD-10-CM | POA: Diagnosis not present

## 2021-06-05 DIAGNOSIS — N1831 Chronic kidney disease, stage 3a: Secondary | ICD-10-CM | POA: Diagnosis not present

## 2021-06-05 DIAGNOSIS — I700 Atherosclerosis of aorta: Secondary | ICD-10-CM | POA: Diagnosis not present

## 2021-06-05 DIAGNOSIS — E559 Vitamin D deficiency, unspecified: Secondary | ICD-10-CM | POA: Diagnosis not present

## 2021-06-05 DIAGNOSIS — Z0000 Encounter for general adult medical examination without abnormal findings: Secondary | ICD-10-CM | POA: Diagnosis not present

## 2021-06-05 DIAGNOSIS — E1122 Type 2 diabetes mellitus with diabetic chronic kidney disease: Secondary | ICD-10-CM | POA: Diagnosis not present

## 2021-06-05 DIAGNOSIS — F419 Anxiety disorder, unspecified: Secondary | ICD-10-CM | POA: Diagnosis not present

## 2021-06-05 DIAGNOSIS — I2510 Atherosclerotic heart disease of native coronary artery without angina pectoris: Secondary | ICD-10-CM | POA: Diagnosis not present

## 2021-06-05 DIAGNOSIS — Z1389 Encounter for screening for other disorder: Secondary | ICD-10-CM | POA: Diagnosis not present

## 2021-06-05 DIAGNOSIS — I10 Essential (primary) hypertension: Secondary | ICD-10-CM | POA: Diagnosis not present

## 2021-06-05 DIAGNOSIS — H35033 Hypertensive retinopathy, bilateral: Secondary | ICD-10-CM | POA: Diagnosis not present

## 2021-06-05 DIAGNOSIS — K219 Gastro-esophageal reflux disease without esophagitis: Secondary | ICD-10-CM | POA: Diagnosis not present

## 2021-06-07 DIAGNOSIS — G5702 Lesion of sciatic nerve, left lower limb: Secondary | ICD-10-CM | POA: Diagnosis not present

## 2021-06-07 DIAGNOSIS — I129 Hypertensive chronic kidney disease with stage 1 through stage 4 chronic kidney disease, or unspecified chronic kidney disease: Secondary | ICD-10-CM | POA: Diagnosis not present

## 2021-06-07 DIAGNOSIS — E1122 Type 2 diabetes mellitus with diabetic chronic kidney disease: Secondary | ICD-10-CM | POA: Diagnosis not present

## 2021-06-07 DIAGNOSIS — N189 Chronic kidney disease, unspecified: Secondary | ICD-10-CM | POA: Diagnosis not present

## 2021-06-07 DIAGNOSIS — E785 Hyperlipidemia, unspecified: Secondary | ICD-10-CM | POA: Diagnosis not present

## 2021-06-07 DIAGNOSIS — N2581 Secondary hyperparathyroidism of renal origin: Secondary | ICD-10-CM | POA: Diagnosis not present

## 2021-06-07 DIAGNOSIS — D631 Anemia in chronic kidney disease: Secondary | ICD-10-CM | POA: Diagnosis not present

## 2021-06-07 DIAGNOSIS — N052 Unspecified nephritic syndrome with diffuse membranous glomerulonephritis: Secondary | ICD-10-CM | POA: Diagnosis not present

## 2021-06-07 DIAGNOSIS — N1831 Chronic kidney disease, stage 3a: Secondary | ICD-10-CM | POA: Diagnosis not present

## 2021-06-27 DIAGNOSIS — E119 Type 2 diabetes mellitus without complications: Secondary | ICD-10-CM | POA: Diagnosis not present

## 2021-07-27 DIAGNOSIS — E119 Type 2 diabetes mellitus without complications: Secondary | ICD-10-CM | POA: Diagnosis not present

## 2021-08-07 DIAGNOSIS — K909 Intestinal malabsorption, unspecified: Secondary | ICD-10-CM | POA: Diagnosis not present

## 2021-08-07 DIAGNOSIS — K219 Gastro-esophageal reflux disease without esophagitis: Secondary | ICD-10-CM | POA: Diagnosis not present

## 2021-08-07 DIAGNOSIS — Z86010 Personal history of colonic polyps: Secondary | ICD-10-CM | POA: Diagnosis not present

## 2021-08-20 DIAGNOSIS — J0100 Acute maxillary sinusitis, unspecified: Secondary | ICD-10-CM | POA: Diagnosis not present

## 2021-08-27 DIAGNOSIS — E119 Type 2 diabetes mellitus without complications: Secondary | ICD-10-CM | POA: Diagnosis not present

## 2021-08-28 DIAGNOSIS — E782 Mixed hyperlipidemia: Secondary | ICD-10-CM | POA: Diagnosis not present

## 2021-08-28 DIAGNOSIS — K219 Gastro-esophageal reflux disease without esophagitis: Secondary | ICD-10-CM | POA: Diagnosis not present

## 2021-08-28 DIAGNOSIS — N1831 Chronic kidney disease, stage 3a: Secondary | ICD-10-CM | POA: Diagnosis not present

## 2021-08-28 DIAGNOSIS — I10 Essential (primary) hypertension: Secondary | ICD-10-CM | POA: Diagnosis not present

## 2021-08-28 DIAGNOSIS — H35033 Hypertensive retinopathy, bilateral: Secondary | ICD-10-CM | POA: Diagnosis not present

## 2021-08-28 DIAGNOSIS — D509 Iron deficiency anemia, unspecified: Secondary | ICD-10-CM | POA: Diagnosis not present

## 2021-08-28 DIAGNOSIS — I2510 Atherosclerotic heart disease of native coronary artery without angina pectoris: Secondary | ICD-10-CM | POA: Diagnosis not present

## 2021-08-28 DIAGNOSIS — E1122 Type 2 diabetes mellitus with diabetic chronic kidney disease: Secondary | ICD-10-CM | POA: Diagnosis not present

## 2021-08-28 DIAGNOSIS — M1990 Unspecified osteoarthritis, unspecified site: Secondary | ICD-10-CM | POA: Diagnosis not present

## 2021-08-28 DIAGNOSIS — E119 Type 2 diabetes mellitus without complications: Secondary | ICD-10-CM | POA: Diagnosis not present

## 2021-09-26 DIAGNOSIS — E119 Type 2 diabetes mellitus without complications: Secondary | ICD-10-CM | POA: Diagnosis not present

## 2021-09-27 DIAGNOSIS — H35033 Hypertensive retinopathy, bilateral: Secondary | ICD-10-CM | POA: Diagnosis not present

## 2021-09-27 DIAGNOSIS — E119 Type 2 diabetes mellitus without complications: Secondary | ICD-10-CM | POA: Diagnosis not present

## 2021-09-27 DIAGNOSIS — K219 Gastro-esophageal reflux disease without esophagitis: Secondary | ICD-10-CM | POA: Diagnosis not present

## 2021-09-27 DIAGNOSIS — E782 Mixed hyperlipidemia: Secondary | ICD-10-CM | POA: Diagnosis not present

## 2021-09-27 DIAGNOSIS — M1990 Unspecified osteoarthritis, unspecified site: Secondary | ICD-10-CM | POA: Diagnosis not present

## 2021-09-27 DIAGNOSIS — I10 Essential (primary) hypertension: Secondary | ICD-10-CM | POA: Diagnosis not present

## 2021-09-27 DIAGNOSIS — E1122 Type 2 diabetes mellitus with diabetic chronic kidney disease: Secondary | ICD-10-CM | POA: Diagnosis not present

## 2021-09-27 DIAGNOSIS — I2510 Atherosclerotic heart disease of native coronary artery without angina pectoris: Secondary | ICD-10-CM | POA: Diagnosis not present

## 2021-09-27 DIAGNOSIS — N1831 Chronic kidney disease, stage 3a: Secondary | ICD-10-CM | POA: Diagnosis not present

## 2021-10-04 DIAGNOSIS — N052 Unspecified nephritic syndrome with diffuse membranous glomerulonephritis: Secondary | ICD-10-CM | POA: Diagnosis not present

## 2021-10-04 DIAGNOSIS — N1831 Chronic kidney disease, stage 3a: Secondary | ICD-10-CM | POA: Diagnosis not present

## 2021-10-04 DIAGNOSIS — I129 Hypertensive chronic kidney disease with stage 1 through stage 4 chronic kidney disease, or unspecified chronic kidney disease: Secondary | ICD-10-CM | POA: Diagnosis not present

## 2021-10-04 DIAGNOSIS — E1122 Type 2 diabetes mellitus with diabetic chronic kidney disease: Secondary | ICD-10-CM | POA: Diagnosis not present

## 2021-10-04 DIAGNOSIS — E785 Hyperlipidemia, unspecified: Secondary | ICD-10-CM | POA: Diagnosis not present

## 2021-10-26 DIAGNOSIS — E119 Type 2 diabetes mellitus without complications: Secondary | ICD-10-CM | POA: Diagnosis not present

## 2021-11-03 ENCOUNTER — Emergency Department (HOSPITAL_BASED_OUTPATIENT_CLINIC_OR_DEPARTMENT_OTHER)
Admission: EM | Admit: 2021-11-03 | Discharge: 2021-11-03 | Disposition: A | Discharge status: Home / Self Care | Payer: PPO | Attending: Emergency Medicine | Admitting: Emergency Medicine

## 2021-11-03 ENCOUNTER — Encounter (HOSPITAL_BASED_OUTPATIENT_CLINIC_OR_DEPARTMENT_OTHER): Payer: Self-pay | Admitting: Emergency Medicine

## 2021-11-03 ENCOUNTER — Other Ambulatory Visit: Payer: Self-pay

## 2021-11-03 DIAGNOSIS — Z7982 Long term (current) use of aspirin: Secondary | ICD-10-CM | POA: Diagnosis not present

## 2021-11-03 DIAGNOSIS — M5442 Lumbago with sciatica, left side: Secondary | ICD-10-CM | POA: Diagnosis not present

## 2021-11-03 DIAGNOSIS — M545 Low back pain, unspecified: Secondary | ICD-10-CM | POA: Diagnosis present

## 2021-11-03 MED ORDER — METHOCARBAMOL 500 MG PO TABS: 500.0000 mg | ORAL_TABLET | Freq: Two times a day (BID) | ORAL | 0 refills | Status: DC

## 2021-11-03 MED ORDER — KETOROLAC TROMETHAMINE 15 MG/ML IJ SOLN
15.0000 mg | Freq: Once | INTRAMUSCULAR | Status: AC
  Administered 2021-11-03: 15 mg via INTRAMUSCULAR
  Filled 2021-11-03: qty 1

## 2021-11-03 MED ORDER — METHYLPREDNISOLONE 4 MG PO TBPK: ORAL_TABLET | ORAL | 0 refills | Status: DC

## 2021-11-03 MED ORDER — OXYCODONE HCL 5 MG PO TABS
5.0000 mg | ORAL_TABLET | Freq: Once | ORAL | Status: AC
  Administered 2021-11-03: 5 mg via ORAL
  Filled 2021-11-03: qty 1

## 2021-11-03 MED ORDER — DICLOFENAC SODIUM 1 % EX GEL: 4.0000 g | Freq: Four times a day (QID) | CUTANEOUS | 0 refills | Status: DC

## 2021-11-03 MED ORDER — ACETAMINOPHEN 500 MG PO TABS
1000.0000 mg | ORAL_TABLET | Freq: Once | ORAL | Status: AC
  Administered 2021-11-03: 1000 mg via ORAL
  Filled 2021-11-03: qty 2

## 2021-11-03 NOTE — ED Provider Notes (Signed)
[facility] EMERGENCY DEPT Provider Note   CSN: [id number] Arrival date & time: 11/03/21  1016     History  Chief Complaint  Patient presents with   Back Pain    Tina Briggs is a 76 y.o. female.  76 yo F with a chief complaints of low back pain.  This is a finding that she has had multiple times in the past.  She has had 3 back surgeries previously she is at all on the same area she had pain.  This pain started a few days ago.  Worse with certain positions.  She denies trauma to the area denies loss of bowel or bladder denies loss of part with sensation she has some numbness to the leg which is typical for her.   Back Pain     Home Medications Prior to Admission medications   Medication Sig Start Date End Date Taking? Authorizing Provider  diclofenac Sodium (VOLTAREN) 1 % GEL Apply 4 g topically 4 (four) times daily. 11/03/21  Yes Deno Etienne, DO  methocarbamol (ROBAXIN) 500 MG tablet Take 1 tablet (500 mg total) by mouth 2 (two) times daily. 11/03/21  Yes Deno Etienne, DO  methylPREDNISolone (MEDROL DOSEPAK) 4 MG TBPK tablet Day 1: 8mg  before breakfast, 4 mg after lunch, 4 mg after supper, and 8 mg at bedtime Day 2: 4 mg before breakfast, 4 mg after lunch, 4 mg  after supper, and 8 mg  at bedtime Day 3:  4 mg  before breakfast, 4 mg  after lunch, 4 mg after supper, and 4 mg  at bedtime Day 4: 4 mg  before breakfast, 4 mg  after lunch, and 4 mg at bedtime Day 5: 4 mg  before breakfast and 4 mg at bedtime Day 6: 4 mg  before breakfast 11/03/21  Yes Deno Etienne, DO  acetaminophen (TYLENOL) 500 MG tablet Take 1,000 mg by mouth 2 (two) times daily as needed for moderate pain or headache.     [provider]  aspirin EC 81 MG tablet Take 81 mg by mouth daily.    [provider]  cholecalciferol (VITAMIN D) 1000 units tablet Take 3,000 Units by mouth daily.    [provider]  cholestyramine light (PREVALITE) 4 g packet Take 4 g by mouth every other  day. 10/12/20   [provider]  CRESTOR 40 MG tablet TAKE 1 TABLET AT BEDTIME Patient taking differently: Take 40 mg by mouth at bedtime. 04/19/13   Wall, Marijo Conception, MD  dapagliflozin propanediol (FARXIGA) 5 MG TABS tablet Take 5 mg by mouth daily.    [provider]  desloratadine (CLARINEX) 5 MG tablet Take 5 mg by mouth daily. 03/23/21   [provider]  famotidine (PEPCID) 40 MG tablet Take 40 mg by mouth 2 (two) times daily as needed for heartburn or indigestion. 10/31/20   [provider]  fluticasone (FLONASE) 50 MCG/ACT nasal spray Place 2 sprays into both nostrils daily as needed for allergies.     [provider]  FREESTYLE LITE test strip USE 1 STRIP TO CHECK GLUCOSE 4 TIMES DAILY 12/04/20   [provider]  gabapentin (NEURONTIN) 300 MG capsule Take 300-600 mg by mouth See admin instructions. 300mg  in the morning 600mg  at night 10/26/18   [provider]  losartan (COZAAR) 25 MG tablet Take 25 mg by mouth 2 (two) times daily. 05/02/21   [provider]  metoprolol tartrate (LOPRESSOR) 100 MG tablet Take one tablet by mouth  2 hours prior to your CT 05/08/21   Belva Crome, MD  Multiple Vitamins-Minerals (PRESERVISION AREDS 2 PO) Take 1 tablet by mouth 2 (two) times daily.    [provider]  NOVOLOG FLEXPEN RELION 100 UNIT/ML FlexPen Inject 14-16 Units into the skin 3 (three) times daily as needed. BS 12/05/20   [provider]  venlafaxine XR (EFFEXOR-XR) 75 MG 24 hr capsule Take 75 mg by mouth every morning.     [provider]  vitamin B-12 (CYANOCOBALAMIN) 1000 MCG tablet Take 1,000 mcg by mouth 3 (three) times a week. Take on Sundays, Wednesdays and Fridays.    [provider]      Allergies    Codeine, Clavulanic acid, Flagyl [metronidazole], Other, Prevacid [lansoprazole], Tetracyclines & related, Trulicity [dulaglutide], Azithromycin, Erythromycin, Morphine and related, and Septra  [sulfamethoxazole-trimethoprim]    Review of Systems   Review of Systems  Musculoskeletal:  Positive for back pain.   Physical Exam Updated Vital Signs BP (!) 181/83 (BP Location: Right Arm)    Pulse 91    Temp 98.4 F (36.9 C) (Oral)    Resp 16    Ht 5\' 3"  (1.6 m)    Wt 78 kg    SpO2 97%    BMI 30.47 kg/m  Physical Exam Vitals and nursing note reviewed.  Constitutional:      General: She is not in acute distress.    Appearance: She is well-developed. She is not diaphoretic.  HENT:     Head: Normocephalic and atraumatic.  Eyes:     Pupils: Pupils are equal, round, and reactive to light.  Cardiovascular:     Rate and Rhythm: Normal rate and regular rhythm.     Heart sounds: No murmur heard.   No friction rub. No gallop.  Pulmonary:     Effort: Pulmonary effort is normal.     Breath sounds: No wheezing or rales.  Abdominal:     General: There is no distension.     Palpations: Abdomen is soft.     Tenderness: There is no abdominal tenderness.  Musculoskeletal:        General: No tenderness.     Cervical back: Normal range of motion and neck supple.     Comments: Pulse motor and sensation intact bilateral lower extremities.  No clonus reflexes 2+ and equal.  Skin:    General: Skin is warm and dry.  Neurological:     Mental Status: She is alert and oriented to person, place, and time.  Psychiatric:        Behavior: Behavior normal.    ED Results / Procedures / Treatments   Labs (all labs ordered are listed, but only abnormal results are displayed) Labs Reviewed - No data to display  EKG None  Radiology No results found.  Procedures Procedures    Medications Ordered in ED Medications  acetaminophen (TYLENOL) tablet 1,000 mg (1,000 mg Oral Given 11/03/21 1134)  ketorolac (TORADOL) 15 MG/ML injection 15 mg (15 mg Intramuscular Given 11/03/21 1135)  oxyCODONE (Oxy IR/ROXICODONE) immediate release tablet 5 mg (5 mg Oral Given 11/03/21 1134)    ED Course/ Medical  Decision Making/ A&P                           Medical Decision Making Risk OTC drugs. Prescription drug management.   76 yo F with a significant past medical history of chronic low back pain with 3 prior surgeries performed  presents with low back pain that she thinks is similar.  Other than her age she has no red flags.  She has a benign exam here.  We will treat her symptomatically.  Have her follow-up with her PCP and her spinal doctor.  I considered a plain film of the low back but with the patient having no red flags other than age I felt this would be unhelpful.  12:41 PM:  I have discussed the diagnosis/risks/treatment options with the patient.  Evaluation and diagnostic testing in the emergency department does not suggest an emergent condition requiring admission or immediate intervention beyond what has been performed at this time.  They will follow up with  PCP. We also discussed returning to the ED immediately if new or worsening sx occur. We discussed the sx which are most concerning (e.g., sudden worsening pain, fever, inability to tolerate by mouth, cauda equina) that necessitate immediate return. Medications administered to the patient during their visit and any new prescriptions provided to the patient are listed below.  Medications given during this visit Medications  acetaminophen (TYLENOL) tablet 1,000 mg (1,000 mg Oral Given 11/03/21 1134)  ketorolac (TORADOL) 15 MG/ML injection 15 mg (15 mg Intramuscular Given 11/03/21 1135)  oxyCODONE (Oxy IR/ROXICODONE) immediate release tablet 5 mg (5 mg Oral Given 11/03/21 1134)     The patient appears reasonably screen and/or stabilized for discharge and I doubt any other medical condition or other [facility] requiring further screening, evaluation, or treatment in the ED at this time prior to discharge.            Final Clinical Impression(s) / ED Diagnoses Final diagnoses:  Acute left-sided low back pain with left-sided sciatica     Rx / DC Orders ED Discharge Orders          Ordered    methylPREDNISolone (MEDROL DOSEPAK) 4 MG TBPK tablet        11/03/21 1126    diclofenac Sodium (VOLTAREN) 1 % GEL  4 times daily        11/03/21 1126    methocarbamol (ROBAXIN) 500 MG tablet  2 times daily        11/03/21 Mayo, Cherae Marton, DO 11/03/21 1241

## 2021-11-03 NOTE — Discharge Instructions (Signed)
Your back pain is most likely due to a muscular strain.  There is been a lot of research on back pain, unfortunately the only thing that seems to really help is Tylenol and ibuprofen.  Relative rest is also important to not lift greater than 10 pounds bending or twisting at the waist.  Please follow-up with your family physician.  The other thing that really seems to benefit patients is physical therapy which your doctor may send you for.  Please return to the emergency department for new numbness or weakness to your arms or legs. Difficulty with urinating or urinating or pooping on yourself.  Also if you cannot feel toilet paper when you wipe or get a fever.   Use the gel and steroids as prescribed. Also take tylenol 1000mg (2 extra strength) four times a day.   You can try the muscle relaxant after that.

## 2021-11-03 NOTE — ED Triage Notes (Signed)
Pt endorses lower back pain and left leg pain since Wednesday night. Ambulatory to room. Hx of back surgeries.

## 2021-11-06 DIAGNOSIS — M549 Dorsalgia, unspecified: Secondary | ICD-10-CM | POA: Diagnosis not present

## 2021-11-06 DIAGNOSIS — R059 Cough, unspecified: Secondary | ICD-10-CM | POA: Diagnosis not present

## 2021-11-13 DIAGNOSIS — M5416 Radiculopathy, lumbar region: Secondary | ICD-10-CM | POA: Diagnosis not present

## 2021-11-13 DIAGNOSIS — Z6830 Body mass index (BMI) 30.0-30.9, adult: Secondary | ICD-10-CM | POA: Diagnosis not present

## 2021-11-14 DIAGNOSIS — M5416 Radiculopathy, lumbar region: Secondary | ICD-10-CM | POA: Diagnosis not present

## 2021-11-14 DIAGNOSIS — M545 Low back pain, unspecified: Secondary | ICD-10-CM | POA: Diagnosis not present

## 2021-11-14 DIAGNOSIS — R200 Anesthesia of skin: Secondary | ICD-10-CM | POA: Diagnosis not present

## 2021-11-14 DIAGNOSIS — H66002 Acute suppurative otitis media without spontaneous rupture of ear drum, left ear: Secondary | ICD-10-CM | POA: Diagnosis not present

## 2021-11-14 DIAGNOSIS — J019 Acute sinusitis, unspecified: Secondary | ICD-10-CM | POA: Diagnosis not present

## 2021-11-20 ENCOUNTER — Other Ambulatory Visit: Payer: Self-pay | Admitting: Student

## 2021-11-20 DIAGNOSIS — I700 Atherosclerosis of aorta: Secondary | ICD-10-CM | POA: Diagnosis not present

## 2021-11-20 DIAGNOSIS — E1122 Type 2 diabetes mellitus with diabetic chronic kidney disease: Secondary | ICD-10-CM | POA: Diagnosis not present

## 2021-11-20 DIAGNOSIS — M5416 Radiculopathy, lumbar region: Secondary | ICD-10-CM

## 2021-11-20 DIAGNOSIS — Z794 Long term (current) use of insulin: Secondary | ICD-10-CM | POA: Diagnosis not present

## 2021-11-20 DIAGNOSIS — N1832 Chronic kidney disease, stage 3b: Secondary | ICD-10-CM | POA: Diagnosis not present

## 2021-11-22 ENCOUNTER — Other Ambulatory Visit: Payer: Self-pay

## 2021-11-22 ENCOUNTER — Ambulatory Visit
Admission: RE | Admit: 2021-11-22 | Discharge: 2021-11-22 | Disposition: A | Discharge status: Home / Self Care | Payer: PPO | Source: Ambulatory Visit | Attending: Student | Admitting: Student

## 2021-11-22 DIAGNOSIS — E782 Mixed hyperlipidemia: Secondary | ICD-10-CM | POA: Diagnosis not present

## 2021-11-22 DIAGNOSIS — I10 Essential (primary) hypertension: Secondary | ICD-10-CM | POA: Diagnosis not present

## 2021-11-22 DIAGNOSIS — M5416 Radiculopathy, lumbar region: Secondary | ICD-10-CM | POA: Diagnosis not present

## 2021-11-22 DIAGNOSIS — M4326 Fusion of spine, lumbar region: Secondary | ICD-10-CM | POA: Diagnosis not present

## 2021-11-22 DIAGNOSIS — E1122 Type 2 diabetes mellitus with diabetic chronic kidney disease: Secondary | ICD-10-CM | POA: Diagnosis not present

## 2021-11-22 MED ORDER — IOPAMIDOL (ISOVUE-M 200) INJECTION 41%
1.0000 mL | Freq: Once | INTRAMUSCULAR | Status: AC
  Administered 2021-11-22: 1 mL via EPIDURAL

## 2021-11-22 MED ORDER — METHYLPREDNISOLONE ACETATE 40 MG/ML INJ SUSP (RADIOLOG
80.0000 mg | Freq: Once | INTRAMUSCULAR | Status: AC
  Administered 2021-11-22: 80 mg via EPIDURAL

## 2021-11-22 NOTE — Discharge Instructions (Signed)

## 2021-11-26 DIAGNOSIS — E119 Type 2 diabetes mellitus without complications: Secondary | ICD-10-CM | POA: Diagnosis not present

## 2021-11-28 DIAGNOSIS — E119 Type 2 diabetes mellitus without complications: Secondary | ICD-10-CM | POA: Diagnosis not present

## 2021-11-28 DIAGNOSIS — H353132 Nonexudative age-related macular degeneration, bilateral, intermediate dry stage: Secondary | ICD-10-CM | POA: Diagnosis not present

## 2021-11-28 DIAGNOSIS — H52203 Unspecified astigmatism, bilateral: Secondary | ICD-10-CM | POA: Diagnosis not present

## 2021-11-28 DIAGNOSIS — H04123 Dry eye syndrome of bilateral lacrimal glands: Secondary | ICD-10-CM | POA: Diagnosis not present

## 2021-12-04 DIAGNOSIS — Z6830 Body mass index (BMI) 30.0-30.9, adult: Secondary | ICD-10-CM | POA: Diagnosis not present

## 2021-12-04 DIAGNOSIS — M5416 Radiculopathy, lumbar region: Secondary | ICD-10-CM | POA: Diagnosis not present

## 2021-12-18 DIAGNOSIS — E1122 Type 2 diabetes mellitus with diabetic chronic kidney disease: Secondary | ICD-10-CM | POA: Diagnosis not present

## 2021-12-18 DIAGNOSIS — N1832 Chronic kidney disease, stage 3b: Secondary | ICD-10-CM | POA: Diagnosis not present

## 2021-12-18 DIAGNOSIS — I10 Essential (primary) hypertension: Secondary | ICD-10-CM | POA: Diagnosis not present

## 2021-12-18 DIAGNOSIS — E782 Mixed hyperlipidemia: Secondary | ICD-10-CM | POA: Diagnosis not present

## 2021-12-26 DIAGNOSIS — E119 Type 2 diabetes mellitus without complications: Secondary | ICD-10-CM | POA: Diagnosis not present

## 2021-12-31 ENCOUNTER — Ambulatory Visit
Admission: RE | Admit: 2021-12-31 | Discharge: 2021-12-31 | Disposition: A | Discharge status: Home / Self Care | Payer: PPO | Source: Ambulatory Visit | Attending: Internal Medicine | Admitting: Internal Medicine

## 2021-12-31 ENCOUNTER — Other Ambulatory Visit: Payer: Self-pay | Admitting: Internal Medicine

## 2021-12-31 DIAGNOSIS — M25562 Pain in left knee: Secondary | ICD-10-CM | POA: Diagnosis not present

## 2021-12-31 DIAGNOSIS — M76892 Other specified enthesopathies of left lower limb, excluding foot: Secondary | ICD-10-CM | POA: Diagnosis not present

## 2021-12-31 DIAGNOSIS — M25571 Pain in right ankle and joints of right foot: Secondary | ICD-10-CM | POA: Diagnosis not present

## 2022-01-11 ENCOUNTER — Other Ambulatory Visit: Payer: Self-pay | Admitting: Student

## 2022-01-11 DIAGNOSIS — M5416 Radiculopathy, lumbar region: Secondary | ICD-10-CM

## 2022-01-14 ENCOUNTER — Ambulatory Visit
Admission: RE | Admit: 2022-01-14 | Discharge: 2022-01-14 | Disposition: A | Discharge status: Home / Self Care | Payer: PPO | Source: Ambulatory Visit | Attending: Student | Admitting: Student

## 2022-01-14 DIAGNOSIS — M4727 Other spondylosis with radiculopathy, lumbosacral region: Secondary | ICD-10-CM | POA: Diagnosis not present

## 2022-01-14 DIAGNOSIS — M5416 Radiculopathy, lumbar region: Secondary | ICD-10-CM

## 2022-01-14 MED ORDER — IOPAMIDOL (ISOVUE-M 200) INJECTION 41%
1.0000 mL | Freq: Once | INTRAMUSCULAR | Status: AC
  Administered 2022-01-14: 1 mL via EPIDURAL

## 2022-01-14 MED ORDER — METHYLPREDNISOLONE ACETATE 40 MG/ML INJ SUSP (RADIOLOG
80.0000 mg | Freq: Once | INTRAMUSCULAR | Status: AC
  Administered 2022-01-14: 80 mg via EPIDURAL

## 2022-01-14 NOTE — Discharge Instructions (Signed)

## 2022-01-21 DIAGNOSIS — E538 Deficiency of other specified B group vitamins: Secondary | ICD-10-CM | POA: Diagnosis not present

## 2022-01-21 DIAGNOSIS — I700 Atherosclerosis of aorta: Secondary | ICD-10-CM | POA: Diagnosis not present

## 2022-01-21 DIAGNOSIS — E782 Mixed hyperlipidemia: Secondary | ICD-10-CM | POA: Diagnosis not present

## 2022-01-21 DIAGNOSIS — N052 Unspecified nephritic syndrome with diffuse membranous glomerulonephritis: Secondary | ICD-10-CM | POA: Diagnosis not present

## 2022-01-21 DIAGNOSIS — E1122 Type 2 diabetes mellitus with diabetic chronic kidney disease: Secondary | ICD-10-CM | POA: Diagnosis not present

## 2022-01-21 DIAGNOSIS — F331 Major depressive disorder, recurrent, moderate: Secondary | ICD-10-CM | POA: Diagnosis not present

## 2022-01-21 DIAGNOSIS — M519 Unspecified thoracic, thoracolumbar and lumbosacral intervertebral disc disorder: Secondary | ICD-10-CM | POA: Diagnosis not present

## 2022-01-21 DIAGNOSIS — I10 Essential (primary) hypertension: Secondary | ICD-10-CM | POA: Diagnosis not present

## 2022-01-21 DIAGNOSIS — N1831 Chronic kidney disease, stage 3a: Secondary | ICD-10-CM | POA: Diagnosis not present

## 2022-01-21 DIAGNOSIS — K219 Gastro-esophageal reflux disease without esophagitis: Secondary | ICD-10-CM | POA: Diagnosis not present

## 2022-01-21 DIAGNOSIS — G629 Polyneuropathy, unspecified: Secondary | ICD-10-CM | POA: Diagnosis not present

## 2022-01-25 DIAGNOSIS — E119 Type 2 diabetes mellitus without complications: Secondary | ICD-10-CM | POA: Diagnosis not present

## 2022-01-29 DIAGNOSIS — M25552 Pain in left hip: Secondary | ICD-10-CM | POA: Diagnosis not present

## 2022-01-31 DIAGNOSIS — M25552 Pain in left hip: Secondary | ICD-10-CM | POA: Diagnosis not present

## 2022-02-05 DIAGNOSIS — M25562 Pain in left knee: Secondary | ICD-10-CM | POA: Diagnosis not present

## 2022-02-05 DIAGNOSIS — M461 Sacroiliitis, not elsewhere classified: Secondary | ICD-10-CM | POA: Diagnosis not present

## 2022-02-05 DIAGNOSIS — Z6831 Body mass index (BMI) 31.0-31.9, adult: Secondary | ICD-10-CM | POA: Diagnosis not present

## 2022-02-06 ENCOUNTER — Other Ambulatory Visit: Payer: Self-pay | Admitting: Internal Medicine

## 2022-02-06 DIAGNOSIS — M79605 Pain in left leg: Secondary | ICD-10-CM | POA: Diagnosis not present

## 2022-02-07 ENCOUNTER — Ambulatory Visit
Admission: RE | Admit: 2022-02-07 | Discharge: 2022-02-07 | Disposition: A | Discharge status: Home / Self Care | Payer: PPO | Source: Ambulatory Visit | Attending: Internal Medicine | Admitting: Internal Medicine

## 2022-02-07 DIAGNOSIS — D631 Anemia in chronic kidney disease: Secondary | ICD-10-CM | POA: Diagnosis not present

## 2022-02-07 DIAGNOSIS — N1831 Chronic kidney disease, stage 3a: Secondary | ICD-10-CM | POA: Diagnosis not present

## 2022-02-07 DIAGNOSIS — M79605 Pain in left leg: Secondary | ICD-10-CM | POA: Diagnosis not present

## 2022-02-07 DIAGNOSIS — I129 Hypertensive chronic kidney disease with stage 1 through stage 4 chronic kidney disease, or unspecified chronic kidney disease: Secondary | ICD-10-CM | POA: Diagnosis not present

## 2022-02-07 DIAGNOSIS — E1122 Type 2 diabetes mellitus with diabetic chronic kidney disease: Secondary | ICD-10-CM | POA: Diagnosis not present

## 2022-02-07 DIAGNOSIS — N052 Unspecified nephritic syndrome with diffuse membranous glomerulonephritis: Secondary | ICD-10-CM | POA: Diagnosis not present

## 2022-02-07 DIAGNOSIS — N189 Chronic kidney disease, unspecified: Secondary | ICD-10-CM | POA: Diagnosis not present

## 2022-02-08 NOTE — Progress Notes (Shared)
?[facility] Note ? ?02/11/2022 ? ?  ? ?CHIEF COMPLAINT ?Patient presents for No chief complaint on file. ? ? ? ?HISTORY OF PRESENT ILLNESS: ?Tina Briggs is a 76 y.o. female who presents to the clinic today for:  ? ?  ?Referring physician: ?Wenda Low, MD ?[address] ?[address] ?[place],  [place] [address] ? ?HISTORICAL INFORMATION:  ? ?Selected notes from the [facility] ?Referred by Dr. Kathrin Penner to eval AMD OD ?LEE: 10.18.21 ?Ocular Hx- AMD OD  ? ?CURRENT MEDICATIONS: ?No current outpatient medications on file. (Ophthalmic Drugs)  ? ?No current facility-administered medications for this visit. (Ophthalmic Drugs)  ? ?Current Outpatient Medications (Other)  ?Medication Sig  ? acetaminophen (TYLENOL) 500 MG tablet Take 1,000 mg by mouth 2 (two) times daily as needed for moderate pain or headache.   ? aspirin EC 81 MG tablet Take 81 mg by mouth daily.  ? cholecalciferol (VITAMIN D) 1000 units tablet Take 3,000 Units by mouth daily.  ? cholestyramine light (PREVALITE) 4 g packet Take 4 g by mouth every other day.  ? CRESTOR 40 MG tablet TAKE 1 TABLET AT BEDTIME (Patient taking differently: Take 40 mg by mouth at bedtime.)  ? dapagliflozin propanediol (FARXIGA) 5 MG TABS tablet Take 5 mg by mouth daily.  ? desloratadine (CLARINEX) 5 MG tablet Take 5 mg by mouth daily.  ? diclofenac Sodium (VOLTAREN) 1 % GEL Apply 4 g topically 4 (four) times daily.  ? famotidine (PEPCID) 40 MG tablet Take 40 mg by mouth 2 (two) times daily as needed for heartburn or indigestion.  ? fluticasone (FLONASE) 50 MCG/ACT nasal spray Place 2 sprays into both nostrils daily as needed for allergies.   ? FREESTYLE LITE test strip USE 1 STRIP TO CHECK GLUCOSE 4 TIMES DAILY  ? gabapentin (NEURONTIN) 300 MG capsule Take 300-600 mg by mouth See admin instructions. '300mg'$  in the morning ?'600mg'$  at night  ? losartan (COZAAR) 25 MG tablet Take 25 mg by mouth 2 (two) times daily.  ? methocarbamol  (ROBAXIN) 500 MG tablet Take 1 tablet (500 mg total) by mouth 2 (two) times daily.  ? methylPREDNISolone (MEDROL DOSEPAK) 4 MG TBPK tablet Day 1: '8mg'$  before breakfast, 4 mg after lunch, 4 mg after supper, and 8 mg at bedtime Day 2: 4 mg before breakfast, 4 mg after lunch, 4 mg  after supper, and 8 mg  at bedtime Day 3:  4 mg  before breakfast, 4 mg  after lunch, 4 mg after supper, and 4 mg  at bedtime Day 4: 4 mg  before breakfast, 4 mg  after lunch, and 4 mg at bedtime Day 5: 4 mg  before breakfast and 4 mg at bedtime Day 6: 4 mg  before breakfast  ? metoprolol tartrate (LOPRESSOR) 100 MG tablet Take one tablet by mouth 2 hours prior to your CT  ? Multiple Vitamins-Minerals (PRESERVISION AREDS 2 PO) Take 1 tablet by mouth 2 (two) times daily.  ? NOVOLOG FLEXPEN RELION 100 UNIT/ML FlexPen Inject 14-16 Units into the skin 3 (three) times daily as needed. BS  ? venlafaxine XR (EFFEXOR-XR) 75 MG 24 hr capsule Take 75 mg by mouth every morning.   ? vitamin B-12 (CYANOCOBALAMIN) 1000 MCG tablet Take 1,000 mcg by mouth 3 (three) times a week. Take on Sundays, Wednesdays and Fridays.  ? ?No current facility-administered medications for this visit. (Other)  ? ? ? ? ?REVIEW OF SYSTEMS: ? ? ? ?ALLERGIES ?Allergies  ?  Allergen Reactions  ? Codeine Shortness Of Breath  ? Clavulanic Acid   ? Flagyl [Metronidazole]   ? Other Other (See Comments)  ? Prevacid [Lansoprazole]   ? Tetracyclines & Related Itching  ? Trulicity [Dulaglutide]   ? Azithromycin Itching and Rash  ? Erythromycin Itching and Rash  ? Morphine And Related Itching and Rash  ? Septra [Sulfamethoxazole-Trimethoprim] Itching and Rash  ? ? ?PAST MEDICAL HISTORY ?Past Medical History:  ?Diagnosis Date  ? Allergic rhinitis   ? Anemia   ? Anxiety   ? Aspiration pneumonia ([place])   ? after common bile duct obstruction  ? CATARACT, RIGHT EYE   ? had repaired  ? Colon polyps   ? Depression   ? Diverticulitis   ? recurrent episodes  ? DM   ? Dyslipidemia   ? Elevated alkaline  phosphatase level   ? GERD   ? GERD (gastroesophageal reflux disease)   ? Glomerulonephritis 11/18/2017  ? 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 Mild to mod TI scarring. (Dr. Lorrene Reid)  ? HNP (herniated nucleus pulposus), lumbar   ? Recurrent  ? HYPERLIPIDEMIA-MIXED   ? HYPERTENSION, UNSPECIFIED   ? Hypertensive retinopathy   ? Injury of right hip   ? Lumbar back pain   ? Macular degeneration   ? Microscopic hematuria   ? Neuropathy   ? OBESITY   ? Plantar fasciitis   ? PONV (postoperative nausea and vomiting)   ? past history only, "sometimes they put a patch on me"  ? Right eye injury   ? ?Past Surgical History:  ?Procedure Laterality Date  ? ABDOMINAL HYSTERECTOMY    ? APPENDECTOMY    ? BACK SURGERY    ? lumbar '09"microdiscectomy"  ? BREAST BIOPSY    ? breat Bx benign    ? CHOLECYSTECTOMY    ? open  ? COLONOSCOPY WITH PROPOFOL N/A 11/22/2014  ? Procedure: COLONOSCOPY WITH PROPOFOL;  Surgeon: Garlan Fair, MD;  Location: [facility];  Service: Endoscopy;  Laterality: N/A;  ? ERCP N/A 05/26/2015  ? Procedure: ENDOSCOPIC RETROGRADE CHOLANGIOPANCREATOGRAPHY (ERCP)   (DOING CASE IN MAIN OR);  Surgeon: Teena Irani, MD;  Location: [facility];  Service: Gastroenterology;  Laterality: N/A;  ? ERCP N/A 06/06/2015  ? Procedure: ENDOSCOPIC RETROGRADE CHOLANGIOPANCREATOGRAPHY (ERCP);  Surgeon: Clarene Essex, MD;  Location: [facility];  Service: Endoscopy;  Laterality: N/A;  ? ERCP N/A 01/12/2021  ? Procedure: ENDOSCOPIC RETROGRADE CHOLANGIOPANCREATOGRAPHY (ERCP);  Surgeon: Clarene Essex, MD;  Location: [facility];  Service: Endoscopy;  Laterality: N/A;  ? ESOPHAGOGASTRODUODENOSCOPY (EGD) WITH PROPOFOL N/A 11/22/2014  ? Procedure: ESOPHAGOGASTRODUODENOSCOPY (EGD) WITH PROPOFOL;  Surgeon: Garlan Fair, MD;  Location: [facility];  Service: Endoscopy;  Laterality: N/A;  ? EYE SURGERY Bilateral   ? cataracts  ? GANGLION CYST EXCISION Right   ? [place]  ? From thigh 1993  ? LUMBAR  FUSION  09/16/2018  ? LUMBAR LAMINECTOMY/DECOMPRESSION MICRODISCECTOMY Left 12/31/2017  ? Procedure: Laminectomy and Foraminotomy - Lumbar Three-Four - left Extraforaminal diskectomy Lumbar Four-Five left;  Surgeon: Eustace Moore, MD;  Location: [facility];  Service: Neurosurgery;  Laterality: Left;  ? PERONEAL NERVE DECOMPRESSION Left 12/31/2017  ? Procedure: Left PERONEAL NERVE DECOMPRESSION;  Surgeon: Eustace Moore, MD;  Location: [facility];  Service: Neurosurgery;  Laterality: Left;  left  ? PERONEAL NERVE DECOMPRESSION    ? REMOVAL OF STONES  01/12/2021  ? Procedure: REMOVAL OF STONES;  Surgeon: Clarene Essex, MD;  Location: [facility];  Service: Endoscopy;;  ? Right ganglion cyst    ? Right ganglion cyst remove    ? TUBAL LIGATION    ? ? ?FAMILY HISTORY ?Family History  ?Problem Relation Age of Onset  ? Cancer Maternal Aunt   ?     breast cancer  ? Cancer Maternal Grandmother   ?     kidney cancer   ? Cancer Paternal Grandmother   ?     bone cancer   ? Heart disease Mother   ? Alzheimer's disease Father   ? Tremor Father   ?     possible PD  ? Diabetes Mellitus II Brother   ? Healthy Son   ? Healthy Son   ? ? ?SOCIAL HISTORY ?Social History  ? ?Tobacco Use  ? Smoking status: Never  ? Smokeless tobacco: Never  ?Vaping Use  ? Vaping Use: Never used  ?Substance Use Topics  ? Alcohol use: No  ? Drug use: No  ? ?  ? ?  ? ?OPHTHALMIC EXAM: ? ?Not recorded ?  ? ? ?IMAGING AND PROCEDURES  ?Imaging and Procedures for 02/11/2022 ? ? ?  ?  ? ?  ?ASSESSMENT/PLAN: ? ?  ICD-10-CM   ?1. Intermediate stage nonexudative age-related macular degeneration of both eyes  H35.3132   ?  ?2. Essential hypertension  I10   ?  ?3. Hypertensive retinopathy of both eyes  H35.033   ?  ?4. Diabetes mellitus type 2 without retinopathy ([place])  E11.9   ?  ?5. Pseudophakia of both eyes  Z96.1   ?  ? ? ? ?1,2. Age related macular degeneration, non-exudative, OU ? - intermediate stage w/ drusen and focal RPE disruptions OU ? - The incidence, anatomy, and  pathology of dry AMD, risk of progression, and the AREDS and AREDS 2 studies including smoking risks discussed with patient. ? - started on AREDS 2 supplements and [employer] monitoring by Dr. Kathrin Penner -- co

## 2022-02-11 ENCOUNTER — Encounter (INDEPENDENT_AMBULATORY_CARE_PROVIDER_SITE_OTHER): Payer: Self-pay

## 2022-02-11 ENCOUNTER — Encounter (INDEPENDENT_AMBULATORY_CARE_PROVIDER_SITE_OTHER): Payer: PPO | Admitting: Ophthalmology

## 2022-02-11 DIAGNOSIS — E119 Type 2 diabetes mellitus without complications: Secondary | ICD-10-CM

## 2022-02-11 DIAGNOSIS — I10 Essential (primary) hypertension: Secondary | ICD-10-CM

## 2022-02-11 DIAGNOSIS — H35033 Hypertensive retinopathy, bilateral: Secondary | ICD-10-CM

## 2022-02-11 DIAGNOSIS — Z961 Presence of intraocular lens: Secondary | ICD-10-CM

## 2022-02-11 DIAGNOSIS — M25562 Pain in left knee: Secondary | ICD-10-CM | POA: Diagnosis not present

## 2022-02-11 DIAGNOSIS — H353132 Nonexudative age-related macular degeneration, bilateral, intermediate dry stage: Secondary | ICD-10-CM

## 2022-02-18 ENCOUNTER — Ambulatory Visit (INDEPENDENT_AMBULATORY_CARE_PROVIDER_SITE_OTHER): Payer: PPO | Admitting: Ophthalmology

## 2022-02-18 ENCOUNTER — Encounter (INDEPENDENT_AMBULATORY_CARE_PROVIDER_SITE_OTHER): Payer: Self-pay | Admitting: Ophthalmology

## 2022-02-18 DIAGNOSIS — H35033 Hypertensive retinopathy, bilateral: Secondary | ICD-10-CM

## 2022-02-18 DIAGNOSIS — I10 Essential (primary) hypertension: Secondary | ICD-10-CM | POA: Diagnosis not present

## 2022-02-18 DIAGNOSIS — Z961 Presence of intraocular lens: Secondary | ICD-10-CM

## 2022-02-18 DIAGNOSIS — E782 Mixed hyperlipidemia: Secondary | ICD-10-CM | POA: Diagnosis not present

## 2022-02-18 DIAGNOSIS — E1122 Type 2 diabetes mellitus with diabetic chronic kidney disease: Secondary | ICD-10-CM | POA: Diagnosis not present

## 2022-02-18 DIAGNOSIS — K219 Gastro-esophageal reflux disease without esophagitis: Secondary | ICD-10-CM | POA: Diagnosis not present

## 2022-02-18 DIAGNOSIS — E119 Type 2 diabetes mellitus without complications: Secondary | ICD-10-CM

## 2022-02-18 DIAGNOSIS — H353132 Nonexudative age-related macular degeneration, bilateral, intermediate dry stage: Secondary | ICD-10-CM | POA: Diagnosis not present

## 2022-02-18 DIAGNOSIS — I2510 Atherosclerotic heart disease of native coronary artery without angina pectoris: Secondary | ICD-10-CM | POA: Diagnosis not present

## 2022-02-18 DIAGNOSIS — N1831 Chronic kidney disease, stage 3a: Secondary | ICD-10-CM | POA: Diagnosis not present

## 2022-02-18 DIAGNOSIS — M25562 Pain in left knee: Secondary | ICD-10-CM | POA: Diagnosis not present

## 2022-02-18 NOTE — Progress Notes (Signed)
[facility] Note  02/18/2022     CHIEF COMPLAINT Patient presents for Retina Follow Up    HISTORY OF PRESENT ILLNESS: Tina Briggs is a 76 y.o. female who presents to the clinic today for:  HPI     Retina Follow Up   Patient presents with  Dry AMD.  In both eyes.  This started 9 months ago.  I, the attending physician,  performed the HPI with the patient and updated documentation appropriately.        Comments   Patient here for 9 months retina follow up for non exu ARMD OU. Patient states vision is fine. Still has the same problem that has every year. When lifts glasses up can see better. No eye pain. Left knee has issues and has back pain. Last week had injection in left knee.       Last edited by Bernarda Caffey, MD on 02/18/2022  9:56 PM.     Referring physician: Wenda Low, MD [facility] [place],  [place] [address]  HISTORICAL INFORMATION:   Selected notes from the MEDICAL RECORD NUMBER Referred by Dr. Kathrin Penner to eval AMD OD LEE: 10.18.21 Ocular Hx- AMD OD   CURRENT MEDICATIONS: No current outpatient medications on file. (Ophthalmic Drugs)   No current facility-administered medications for this visit. (Ophthalmic Drugs)   Current Outpatient Medications (Other)  Medication Sig   acetaminophen (TYLENOL) 500 MG tablet Take 1,000 mg by mouth 2 (two) times daily as needed for moderate pain or headache.    aspirin EC 81 MG tablet Take 81 mg by mouth daily.   cholecalciferol (VITAMIN D) 1000 units tablet Take 3,000 Units by mouth daily.   CRESTOR 40 MG tablet TAKE 1 TABLET AT BEDTIME (Patient taking differently: Take 40 mg by mouth at bedtime.)   dapagliflozin propanediol (FARXIGA) 5 MG TABS tablet Take 5 mg by mouth daily.   desloratadine (CLARINEX) 5 MG tablet Take 5 mg by mouth daily.   famotidine (PEPCID) 40 MG tablet Take 40 mg by mouth 2 (two) times daily as needed for heartburn or indigestion.    fluticasone (FLONASE) 50 MCG/ACT nasal spray Place 2 sprays into both nostrils daily as needed for allergies.    FREESTYLE LITE test strip USE 1 STRIP TO CHECK GLUCOSE 4 TIMES DAILY   gabapentin (NEURONTIN) 300 MG capsule Take 300-600 mg by mouth See admin instructions. '300mg'$  in the morning '600mg'$  at night   losartan (COZAAR) 25 MG tablet Take 25 mg by mouth 2 (two) times daily.   methocarbamol (ROBAXIN) 500 MG tablet Take 1 tablet (500 mg total) by mouth 2 (two) times daily.   Multiple Vitamins-Minerals (PRESERVISION AREDS 2 PO) Take 1 tablet by mouth 2 (two) times daily.   NOVOLOG FLEXPEN RELION 100 UNIT/ML FlexPen Inject 14-16 Units into the skin 3 (three) times daily as needed. BS   venlafaxine XR (EFFEXOR-XR) 75 MG 24 hr capsule Take 75 mg by mouth every morning.    vitamin B-12 (CYANOCOBALAMIN) 1000 MCG tablet Take 1,000 mcg by mouth 3 (three) times a week. Take on Sundays, Wednesdays and Fridays.   cholestyramine light (PREVALITE) 4 g packet Take 4 g by mouth every other day.   diclofenac Sodium (VOLTAREN) 1 % GEL Apply 4 g topically 4 (four) times daily.   methylPREDNISolone (MEDROL DOSEPAK) 4 MG TBPK tablet Day 1: '8mg'$  before breakfast, 4 mg after lunch, 4 mg after supper, and 8 mg at bedtime Day 2:  4 mg before breakfast, 4 mg after lunch, 4 mg  after supper, and 8 mg  at bedtime Day 3:  4 mg  before breakfast, 4 mg  after lunch, 4 mg after supper, and 4 mg  at bedtime Day 4: 4 mg  before breakfast, 4 mg  after lunch, and 4 mg at bedtime Day 5: 4 mg  before breakfast and 4 mg at bedtime Day 6: 4 mg  before breakfast   metoprolol tartrate (LOPRESSOR) 100 MG tablet Take one tablet by mouth 2 hours prior to your CT   No current facility-administered medications for this visit. (Other)   REVIEW OF SYSTEMS: ROS   Positive for: Gastrointestinal, Neurological, Genitourinary, Musculoskeletal, Endocrine, Cardiovascular, Eyes Negative for: Constitutional, Skin, HENT, Respiratory, Psychiatric,  Allergic/Imm, Heme/Lymph Last edited by Theodore Demark, COA on 02/18/2022  2:50 PM.     ALLERGIES Allergies  Allergen Reactions   Codeine Shortness Of Breath   Clavulanic Acid    Flagyl [Metronidazole]    Other Other (See Comments)   Prevacid [Lansoprazole]    Tetracyclines & Related Itching   Trulicity [Dulaglutide]    Azithromycin Itching and Rash   Erythromycin Itching and Rash   Morphine And Related Itching and Rash   Septra [Sulfamethoxazole-Trimethoprim] Itching and Rash   PAST MEDICAL HISTORY 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 Mild to mod TI scarring. (Dr. Lorrene Reid)   HNP (herniated nucleus pulposus), lumbar    Recurrent   HYPERLIPIDEMIA-MIXED    HYPERTENSION, UNSPECIFIED    Hypertensive retinopathy    Injury of right hip    Lumbar back pain    Macular degeneration    Microscopic hematuria    Neuropathy    OBESITY    Plantar fasciitis    PONV (postoperative nausea and vomiting)    past history only, "sometimes they put a patch on me"   Right eye injury    Past Surgical History:  Procedure Laterality Date   ABDOMINAL HYSTERECTOMY     APPENDECTOMY     BACK SURGERY     lumbar '09"microdiscectomy"   BREAST BIOPSY     breat Bx benign     CHOLECYSTECTOMY     open   COLONOSCOPY WITH PROPOFOL N/A 11/22/2014   Procedure: COLONOSCOPY WITH PROPOFOL;  Surgeon: Garlan Fair, MD;  Location: [facility];  Service: Endoscopy;  Laterality: N/A;   ERCP N/A 05/26/2015   Procedure: ENDOSCOPIC RETROGRADE CHOLANGIOPANCREATOGRAPHY (ERCP)    (DOING CASE IN MAIN OR);  Surgeon: Teena Irani, MD;  Location: [facility];  Service: Gastroenterology;  Laterality: N/A;   ERCP N/A 06/06/2015   Procedure: ENDOSCOPIC RETROGRADE CHOLANGIOPANCREATOGRAPHY (ERCP);  Surgeon: Clarene Essex, MD;  Location: [facility];  Service: Endoscopy;  Laterality: N/A;   ERCP N/A 01/12/2021   Procedure: ENDOSCOPIC RETROGRADE CHOLANGIOPANCREATOGRAPHY (ERCP);  Surgeon: Clarene Essex, MD;  Location: [facility];  Service: Endoscopy;  Laterality: N/A;   ESOPHAGOGASTRODUODENOSCOPY (EGD) WITH PROPOFOL N/A 11/22/2014   Procedure: ESOPHAGOGASTRODUODENOSCOPY (EGD) WITH  PROPOFOL;  Surgeon: Garlan Fair, MD;  Location: [facility];  Service: Endoscopy;  Laterality: N/A;   EYE SURGERY Bilateral    cataracts   GANGLION CYST EXCISION Right    LIPOMA EXCISION  1993   From thigh 1993   LUMBAR FUSION  09/16/2018   LUMBAR LAMINECTOMY/DECOMPRESSION MICRODISCECTOMY Left 12/31/2017   Procedure: Laminectomy and Foraminotomy - Lumbar Three-Four - left Extraforaminal diskectomy Lumbar Four-Five left;  Surgeon: Eustace Moore, MD;  Location: [facility];  Service: Neurosurgery;  Laterality: Left;   PERONEAL NERVE DECOMPRESSION Left 12/31/2017   Procedure: Left PERONEAL NERVE DECOMPRESSION;  Surgeon: Eustace Moore, MD;  Location: [facility];  Service: Neurosurgery;  Laterality: Left;  left   PERONEAL NERVE DECOMPRESSION     REMOVAL OF STONES  01/12/2021   Procedure: REMOVAL OF STONES;  Surgeon: Clarene Essex, MD;  Location: [facility];  Service: Endoscopy;;   Right ganglion cyst     Right ganglion cyst remove     TUBAL LIGATION     FAMILY HISTORY Family History  Problem Relation Age of Onset   Cancer Maternal Aunt        breast cancer   Cancer Maternal Grandmother        kidney cancer    Cancer Paternal Grandmother        bone cancer    Heart disease Mother    Alzheimer's disease Father    Tremor Father        possible PD   Diabetes Mellitus II Brother    Healthy Son    Healthy  Son    SOCIAL HISTORY Social History   Tobacco Use   Smoking status: Never   Smokeless tobacco: Never  Vaping Use   Vaping Use: Never used  Substance Use Topics   Alcohol use: No   Drug use: No       OPHTHALMIC EXAM:  Base Eye Exam     Visual Acuity (Snellen - Linear)       Right Left   Dist cc 20/20 -2 20/20 -1    Correction: Glasses         Tonometry (Tonopen, 2:46 PM)       Right Left   Pressure 14 13         Pupils       Dark Light Shape React APD   Right 3 2 Round Brisk None   Left 3 2 Round Brisk None         Visual Fields (Counting fingers)       Left Right    Full Full         Extraocular Movement       Right Left    Full, Ortho Full, Ortho         Neuro/Psych     Oriented x3: Yes   Mood/Affect: Normal         Dilation     Both eyes: 1.0% Mydriacyl, 2.5% Phenylephrine @ 2:46 PM           Slit Lamp and Fundus Exam     Slit Lamp Exam       Right Left   Lids/Lashes Dermatochalasis - upper lid Dermatochalasis - upper lid   Conjunctiva/Sclera White and quiet temporal pinguecula   Cornea Mild arcus, trace Punctate epithelial erosions, tear film debris, well healed temporal cataract wounds Mild arcus, trace, fine Punctate epithelial erosions, tear film debris, well healed temporal cataract wounds   Anterior Chamber Deep and quiet Deep  and quiet   Iris Round and moderately dilated Round and moderately dilated   Lens PC IOL in good position with open PC PC IOL in good position   Anterior Vitreous Vitreous syneresis, Posterior vitreous detachment Mild Vitreous syneresis, Posterior vitreous detachment, vitreous condensations         Fundus Exam       Right Left   Disc Pink and Sharp, Compact Pink and Sharp, Compact   C/D Ratio 0.4 0.4   Macula Flat, Blunted foveal reflex, Drusen, RPE mottling and clumping, focal pigment clump IN fovea, No heme or edema Flat, Blunted foveal reflex, Drusen, RPE mottling and clumping,  focal pigment clump nasal fovea, No heme or edema   Vessels attenuated, Tortuous attenuated, mild tortuousity, mild AV crossing changes, mild Copper wiring   Periphery Attached, reticular degeneration greatest nasally, No heme Attached, reticular degeneration greatest nasally, No heme           Refraction     Wearing Rx       Sphere Cylinder Axis Add   Right -0.50 +0.75 116 +2.50   Left +0.25 +1.00 160 +2.50    Type: prog            IMAGING AND PROCEDURES  Imaging and Procedures for 02/18/2022  OCT, Retina - OU - Both Eyes       Right Eye Quality was good. Central Foveal Thickness: 306. Progression has been stable. Findings include normal foveal contour, no IRF, no SRF, retinal drusen (Focal RPE irregularity IN fovea).   Left Eye Quality was good. Central Foveal Thickness: 299. Progression has been stable. Findings include normal foveal contour, no IRF, no SRF, retinal drusen , intraretinal hyper-reflective material (Focal RPE irregularity nasal fovea).   Notes *Images captured and stored on drive  Diagnosis / Impression:  Non-exu ARMD with drusen and focal RPE disruptions OU -- no significant change from prior  Clinical management:  See below  Abbreviations: NFP - Normal foveal profile. CME - cystoid macular edema. PED - pigment epithelial detachment. IRF - intraretinal fluid. SRF - subretinal fluid. EZ - ellipsoid zone. ERM - epiretinal membrane. ORA - outer retinal atrophy. ORT - outer retinal tubulation. SRHM - subretinal hyper-reflective material. IRHM - intraretinal hyper-reflective material             ASSESSMENT/PLAN:    ICD-10-CM   1. Intermediate stage nonexudative age-related macular degeneration of both eyes  H35.3132 OCT, Retina - OU - Both Eyes    2. Essential hypertension  I10     3. Hypertensive retinopathy of both eyes  H35.033     4. Diabetes mellitus type 2 without retinopathy ([place])  E11.9     5. Pseudophakia of both eyes  Z96.1         1,2. Age related macular degeneration, non-exudative, OU  - intermediate stage w/ drusen and focal RPE disruptions OU -- stable  - The incidence, anatomy, and pathology of dry AMD, risk of progression, and the AREDS and AREDS 2 studies including smoking risks discussed with patient.  - started on AREDS 2 supplements and [place] Grid monitoring by Dr. Kathrin Penner -- continue  - BCVA stable at 20/20 OD, 20/20 OS  - OCT without significant change or exudative disease OU  - f/u 9-12 months, DFE, OCT  3,4. Hypertensive retinopathy OU - discussed importance of tight BP control - monitor  5. Diabetes mellitus, type 2 without retinopathy  - A1c was 6.8 on 2.15.22 - The incidence, risk factors for progression,  natural history and treatment options for diabetic retinopathy  were discussed with patient.   - The need for close monitoring of blood glucose, blood pressure, and serum lipids, avoiding cigarette or any type of tobacco, and the need for long term follow up was also discussed with patient. - f/u in 1 year, sooner prn  6. Pseudophakia OU  - s/p CE/IOL OU  - IOL in good position, doing well  - monitor  Ophthalmic Meds Ordered this visit:  No orders of the defined types were placed in this encounter.    Return for Return 9-12 months for DFE/OCT.  There are no Patient Instructions on file for this visit.  This document serves as a record of services personally performed by Gardiner Sleeper, MD, PhD. It was created on their behalf by Roselee Nova, COMT. The creation of this record is the provider's dictation and/or activities during the visit.  Electronically signed by: Roselee Nova, COMT 02/18/22 9:57 PM  This document serves as a record of services personally performed by Gardiner Sleeper, MD, PhD. It was created on their behalf by Leonie Douglas, an ophthalmic technician. The creation of this record is the provider's dictation and/or activities during the visit.    Electronically  signed by: Leonie Douglas COA, 02/18/22  9:57 PM   Gardiner Sleeper, M.D., Ph.D. Diseases & Surgery of the Retina and Vitreous [facility]  I have reviewed the above documentation for accuracy and completeness, and I agree with the above. Gardiner Sleeper, M.D., Ph.D. 02/18/22 10:04 PM  Abbreviations: M myopia (nearsighted); A astigmatism; H hyperopia (farsighted); P presbyopia; Mrx spectacle prescription;  CTL contact lenses; OD right eye; OS left eye; OU both eyes  XT exotropia; ET esotropia; PEK punctate epithelial keratitis; PEE punctate epithelial erosions; DES dry eye syndrome; MGD meibomian gland dysfunction; ATs artificial tears; PFAT's preservative free artificial tears; [place] nuclear sclerotic cataract; PSC posterior subcapsular cataract; ERM epi-retinal membrane; PVD posterior vitreous detachment; RD retinal detachment; DM diabetes mellitus; DR diabetic retinopathy; NPDR non-proliferative diabetic retinopathy; PDR proliferative diabetic retinopathy; CSME clinically significant macular edema; DME diabetic macular edema; dbh dot blot hemorrhages; CWS cotton wool spot; POAG primary open angle glaucoma; C/D cup-to-disc ratio; HVF humphrey visual field; GVF goldmann visual field; OCT optical coherence tomography; IOP intraocular pressure; BRVO Branch retinal vein occlusion; CRVO central retinal vein occlusion; CRAO central retinal artery occlusion; BRAO branch retinal artery occlusion; RT retinal tear; SB scleral buckle; PPV pars plana vitrectomy; VH Vitreous hemorrhage; PRP panretinal laser photocoagulation; IVK intravitreal kenalog; VMT vitreomacular traction; MH Macular hole;  NVD neovascularization of the disc; NVE neovascularization elsewhere; AREDS age related eye disease study; ARMD age related macular degeneration; POAG primary open angle glaucoma; EBMD epithelial/anterior basement membrane dystrophy; ACIOL anterior chamber intraocular lens; IOL intraocular lens; PCIOL  posterior chamber intraocular lens; Phaco/IOL phacoemulsification with intraocular lens placement; [place] photorefractive keratectomy; LASIK laser assisted in situ keratomileusis; HTN hypertension; DM diabetes mellitus; COPD chronic obstructive pulmonary disease

## 2022-02-26 DIAGNOSIS — E119 Type 2 diabetes mellitus without complications: Secondary | ICD-10-CM | POA: Diagnosis not present

## 2022-03-07 DIAGNOSIS — M25562 Pain in left knee: Secondary | ICD-10-CM | POA: Diagnosis not present

## 2022-03-12 DIAGNOSIS — M25562 Pain in left knee: Secondary | ICD-10-CM | POA: Diagnosis not present

## 2022-03-14 DIAGNOSIS — M25562 Pain in left knee: Secondary | ICD-10-CM | POA: Diagnosis not present

## 2022-03-19 DIAGNOSIS — M25562 Pain in left knee: Secondary | ICD-10-CM | POA: Diagnosis not present

## 2022-03-25 DIAGNOSIS — E1122 Type 2 diabetes mellitus with diabetic chronic kidney disease: Secondary | ICD-10-CM | POA: Diagnosis not present

## 2022-03-25 DIAGNOSIS — M1712 Unilateral primary osteoarthritis, left knee: Secondary | ICD-10-CM | POA: Diagnosis not present

## 2022-03-25 DIAGNOSIS — M25562 Pain in left knee: Secondary | ICD-10-CM | POA: Diagnosis not present

## 2022-03-25 DIAGNOSIS — E782 Mixed hyperlipidemia: Secondary | ICD-10-CM | POA: Diagnosis not present

## 2022-03-25 DIAGNOSIS — I10 Essential (primary) hypertension: Secondary | ICD-10-CM | POA: Diagnosis not present

## 2022-03-28 DIAGNOSIS — E119 Type 2 diabetes mellitus without complications: Secondary | ICD-10-CM | POA: Diagnosis not present

## 2022-04-04 DIAGNOSIS — Z23 Encounter for immunization: Secondary | ICD-10-CM | POA: Diagnosis not present

## 2022-04-04 DIAGNOSIS — L03115 Cellulitis of right lower limb: Secondary | ICD-10-CM | POA: Diagnosis not present

## 2022-04-11 DIAGNOSIS — L03116 Cellulitis of left lower limb: Secondary | ICD-10-CM | POA: Diagnosis not present

## 2022-04-18 DIAGNOSIS — L0390 Cellulitis, unspecified: Secondary | ICD-10-CM | POA: Diagnosis not present

## 2022-05-02 DIAGNOSIS — E119 Type 2 diabetes mellitus without complications: Secondary | ICD-10-CM | POA: Diagnosis not present

## 2022-05-17 DIAGNOSIS — I10 Essential (primary) hypertension: Secondary | ICD-10-CM | POA: Diagnosis not present

## 2022-05-17 DIAGNOSIS — K219 Gastro-esophageal reflux disease without esophagitis: Secondary | ICD-10-CM | POA: Diagnosis not present

## 2022-05-17 DIAGNOSIS — N1831 Chronic kidney disease, stage 3a: Secondary | ICD-10-CM | POA: Diagnosis not present

## 2022-05-17 DIAGNOSIS — E1122 Type 2 diabetes mellitus with diabetic chronic kidney disease: Secondary | ICD-10-CM | POA: Diagnosis not present

## 2022-05-17 DIAGNOSIS — F322 Major depressive disorder, single episode, severe without psychotic features: Secondary | ICD-10-CM | POA: Diagnosis not present

## 2022-05-17 DIAGNOSIS — E782 Mixed hyperlipidemia: Secondary | ICD-10-CM | POA: Diagnosis not present

## 2022-05-27 DIAGNOSIS — N1832 Chronic kidney disease, stage 3b: Secondary | ICD-10-CM | POA: Diagnosis not present

## 2022-05-27 DIAGNOSIS — E1122 Type 2 diabetes mellitus with diabetic chronic kidney disease: Secondary | ICD-10-CM | POA: Diagnosis not present

## 2022-05-27 DIAGNOSIS — I700 Atherosclerosis of aorta: Secondary | ICD-10-CM | POA: Diagnosis not present

## 2022-05-27 DIAGNOSIS — Z794 Long term (current) use of insulin: Secondary | ICD-10-CM | POA: Diagnosis not present

## 2022-06-06 DIAGNOSIS — E119 Type 2 diabetes mellitus without complications: Secondary | ICD-10-CM | POA: Diagnosis not present

## 2022-06-07 DIAGNOSIS — G250 Essential tremor: Secondary | ICD-10-CM | POA: Diagnosis not present

## 2022-06-07 DIAGNOSIS — I10 Essential (primary) hypertension: Secondary | ICD-10-CM | POA: Diagnosis not present

## 2022-06-07 DIAGNOSIS — E1122 Type 2 diabetes mellitus with diabetic chronic kidney disease: Secondary | ICD-10-CM | POA: Diagnosis not present

## 2022-06-07 DIAGNOSIS — F419 Anxiety disorder, unspecified: Secondary | ICD-10-CM | POA: Diagnosis not present

## 2022-06-07 DIAGNOSIS — N052 Unspecified nephritic syndrome with diffuse membranous glomerulonephritis: Secondary | ICD-10-CM | POA: Diagnosis not present

## 2022-06-07 DIAGNOSIS — E559 Vitamin D deficiency, unspecified: Secondary | ICD-10-CM | POA: Diagnosis not present

## 2022-06-07 DIAGNOSIS — Z0000 Encounter for general adult medical examination without abnormal findings: Secondary | ICD-10-CM | POA: Diagnosis not present

## 2022-06-07 DIAGNOSIS — E538 Deficiency of other specified B group vitamins: Secondary | ICD-10-CM | POA: Diagnosis not present

## 2022-06-07 DIAGNOSIS — Z1331 Encounter for screening for depression: Secondary | ICD-10-CM | POA: Diagnosis not present

## 2022-06-07 DIAGNOSIS — I700 Atherosclerosis of aorta: Secondary | ICD-10-CM | POA: Diagnosis not present

## 2022-06-07 DIAGNOSIS — E782 Mixed hyperlipidemia: Secondary | ICD-10-CM | POA: Diagnosis not present

## 2022-06-07 DIAGNOSIS — F331 Major depressive disorder, recurrent, moderate: Secondary | ICD-10-CM | POA: Diagnosis not present

## 2022-06-07 DIAGNOSIS — N1831 Chronic kidney disease, stage 3a: Secondary | ICD-10-CM | POA: Diagnosis not present

## 2022-06-14 DIAGNOSIS — I129 Hypertensive chronic kidney disease with stage 1 through stage 4 chronic kidney disease, or unspecified chronic kidney disease: Secondary | ICD-10-CM | POA: Diagnosis not present

## 2022-06-14 DIAGNOSIS — N052 Unspecified nephritic syndrome with diffuse membranous glomerulonephritis: Secondary | ICD-10-CM | POA: Diagnosis not present

## 2022-06-14 DIAGNOSIS — N1831 Chronic kidney disease, stage 3a: Secondary | ICD-10-CM | POA: Diagnosis not present

## 2022-06-14 DIAGNOSIS — E1122 Type 2 diabetes mellitus with diabetic chronic kidney disease: Secondary | ICD-10-CM | POA: Diagnosis not present

## 2022-07-02 DIAGNOSIS — K219 Gastro-esophageal reflux disease without esophagitis: Secondary | ICD-10-CM | POA: Diagnosis not present

## 2022-07-02 DIAGNOSIS — Z86010 Personal history of colonic polyps: Secondary | ICD-10-CM | POA: Diagnosis not present

## 2022-07-02 DIAGNOSIS — K909 Intestinal malabsorption, unspecified: Secondary | ICD-10-CM | POA: Diagnosis not present

## 2022-07-08 DIAGNOSIS — E119 Type 2 diabetes mellitus without complications: Secondary | ICD-10-CM | POA: Diagnosis not present

## 2022-07-10 DIAGNOSIS — Z86010 Personal history of colonic polyps: Secondary | ICD-10-CM | POA: Diagnosis not present

## 2022-07-10 DIAGNOSIS — K449 Diaphragmatic hernia without obstruction or gangrene: Secondary | ICD-10-CM | POA: Diagnosis not present

## 2022-07-10 DIAGNOSIS — K219 Gastro-esophageal reflux disease without esophagitis: Secondary | ICD-10-CM | POA: Diagnosis not present

## 2022-07-10 DIAGNOSIS — K2930 Chronic superficial gastritis without bleeding: Secondary | ICD-10-CM | POA: Diagnosis not present

## 2022-07-10 DIAGNOSIS — K5730 Diverticulosis of large intestine without perforation or abscess without bleeding: Secondary | ICD-10-CM | POA: Diagnosis not present

## 2022-07-10 DIAGNOSIS — K649 Unspecified hemorrhoids: Secondary | ICD-10-CM | POA: Diagnosis not present

## 2022-07-10 DIAGNOSIS — K635 Polyp of colon: Secondary | ICD-10-CM | POA: Diagnosis not present

## 2022-07-10 DIAGNOSIS — D123 Benign neoplasm of transverse colon: Secondary | ICD-10-CM | POA: Diagnosis not present

## 2022-07-10 DIAGNOSIS — Z09 Encounter for follow-up examination after completed treatment for conditions other than malignant neoplasm: Secondary | ICD-10-CM | POA: Diagnosis not present

## 2022-07-10 DIAGNOSIS — K259 Gastric ulcer, unspecified as acute or chronic, without hemorrhage or perforation: Secondary | ICD-10-CM | POA: Diagnosis not present

## 2022-07-18 DIAGNOSIS — K2930 Chronic superficial gastritis without bleeding: Secondary | ICD-10-CM | POA: Diagnosis not present

## 2022-07-18 DIAGNOSIS — D123 Benign neoplasm of transverse colon: Secondary | ICD-10-CM | POA: Diagnosis not present

## 2022-07-18 DIAGNOSIS — K635 Polyp of colon: Secondary | ICD-10-CM | POA: Diagnosis not present

## 2022-07-26 DIAGNOSIS — N1831 Chronic kidney disease, stage 3a: Secondary | ICD-10-CM | POA: Diagnosis not present

## 2022-07-26 DIAGNOSIS — I10 Essential (primary) hypertension: Secondary | ICD-10-CM | POA: Diagnosis not present

## 2022-07-26 DIAGNOSIS — F322 Major depressive disorder, single episode, severe without psychotic features: Secondary | ICD-10-CM | POA: Diagnosis not present

## 2022-07-26 DIAGNOSIS — E782 Mixed hyperlipidemia: Secondary | ICD-10-CM | POA: Diagnosis not present

## 2022-07-26 DIAGNOSIS — K219 Gastro-esophageal reflux disease without esophagitis: Secondary | ICD-10-CM | POA: Diagnosis not present

## 2022-07-26 DIAGNOSIS — E1122 Type 2 diabetes mellitus with diabetic chronic kidney disease: Secondary | ICD-10-CM | POA: Diagnosis not present

## 2022-08-07 DIAGNOSIS — E119 Type 2 diabetes mellitus without complications: Secondary | ICD-10-CM | POA: Diagnosis not present

## 2022-08-20 DIAGNOSIS — F322 Major depressive disorder, single episode, severe without psychotic features: Secondary | ICD-10-CM | POA: Diagnosis not present

## 2022-08-20 DIAGNOSIS — K219 Gastro-esophageal reflux disease without esophagitis: Secondary | ICD-10-CM | POA: Diagnosis not present

## 2022-08-20 DIAGNOSIS — I10 Essential (primary) hypertension: Secondary | ICD-10-CM | POA: Diagnosis not present

## 2022-08-20 DIAGNOSIS — E1122 Type 2 diabetes mellitus with diabetic chronic kidney disease: Secondary | ICD-10-CM | POA: Diagnosis not present

## 2022-08-20 DIAGNOSIS — E782 Mixed hyperlipidemia: Secondary | ICD-10-CM | POA: Diagnosis not present

## 2022-08-20 DIAGNOSIS — N1831 Chronic kidney disease, stage 3a: Secondary | ICD-10-CM | POA: Diagnosis not present

## 2022-09-03 DIAGNOSIS — E1122 Type 2 diabetes mellitus with diabetic chronic kidney disease: Secondary | ICD-10-CM | POA: Diagnosis not present

## 2022-09-03 DIAGNOSIS — I10 Essential (primary) hypertension: Secondary | ICD-10-CM | POA: Diagnosis not present

## 2022-09-03 DIAGNOSIS — F331 Major depressive disorder, recurrent, moderate: Secondary | ICD-10-CM | POA: Diagnosis not present

## 2022-09-03 DIAGNOSIS — N1831 Chronic kidney disease, stage 3a: Secondary | ICD-10-CM | POA: Diagnosis not present

## 2022-09-03 DIAGNOSIS — K219 Gastro-esophageal reflux disease without esophagitis: Secondary | ICD-10-CM | POA: Diagnosis not present

## 2022-09-03 DIAGNOSIS — E782 Mixed hyperlipidemia: Secondary | ICD-10-CM | POA: Diagnosis not present

## 2022-09-06 DIAGNOSIS — E119 Type 2 diabetes mellitus without complications: Secondary | ICD-10-CM | POA: Diagnosis not present

## 2022-10-14 DIAGNOSIS — E119 Type 2 diabetes mellitus without complications: Secondary | ICD-10-CM | POA: Diagnosis not present

## 2022-10-24 DIAGNOSIS — N1831 Chronic kidney disease, stage 3a: Secondary | ICD-10-CM | POA: Diagnosis not present

## 2022-10-24 DIAGNOSIS — I129 Hypertensive chronic kidney disease with stage 1 through stage 4 chronic kidney disease, or unspecified chronic kidney disease: Secondary | ICD-10-CM | POA: Diagnosis not present

## 2022-10-24 DIAGNOSIS — E1122 Type 2 diabetes mellitus with diabetic chronic kidney disease: Secondary | ICD-10-CM | POA: Diagnosis not present

## 2022-10-24 DIAGNOSIS — K279 Peptic ulcer, site unspecified, unspecified as acute or chronic, without hemorrhage or perforation: Secondary | ICD-10-CM | POA: Diagnosis not present

## 2022-10-24 DIAGNOSIS — N052 Unspecified nephritic syndrome with diffuse membranous glomerulonephritis: Secondary | ICD-10-CM | POA: Diagnosis not present

## 2022-11-14 DIAGNOSIS — E119 Type 2 diabetes mellitus without complications: Secondary | ICD-10-CM | POA: Diagnosis not present

## 2022-11-15 DIAGNOSIS — I10 Essential (primary) hypertension: Secondary | ICD-10-CM | POA: Diagnosis not present

## 2022-11-15 DIAGNOSIS — E1122 Type 2 diabetes mellitus with diabetic chronic kidney disease: Secondary | ICD-10-CM | POA: Diagnosis not present

## 2022-11-15 DIAGNOSIS — E782 Mixed hyperlipidemia: Secondary | ICD-10-CM | POA: Diagnosis not present

## 2022-11-15 DIAGNOSIS — N1832 Chronic kidney disease, stage 3b: Secondary | ICD-10-CM | POA: Diagnosis not present

## 2022-11-15 DIAGNOSIS — K219 Gastro-esophageal reflux disease without esophagitis: Secondary | ICD-10-CM | POA: Diagnosis not present

## 2022-11-21 DIAGNOSIS — I700 Atherosclerosis of aorta: Secondary | ICD-10-CM | POA: Diagnosis not present

## 2022-11-21 DIAGNOSIS — I340 Nonrheumatic mitral (valve) insufficiency: Secondary | ICD-10-CM | POA: Diagnosis not present

## 2022-11-21 DIAGNOSIS — E1122 Type 2 diabetes mellitus with diabetic chronic kidney disease: Secondary | ICD-10-CM | POA: Diagnosis not present

## 2022-11-21 DIAGNOSIS — J0100 Acute maxillary sinusitis, unspecified: Secondary | ICD-10-CM | POA: Diagnosis not present

## 2022-11-21 DIAGNOSIS — I519 Heart disease, unspecified: Secondary | ICD-10-CM | POA: Diagnosis not present

## 2022-11-21 DIAGNOSIS — N1831 Chronic kidney disease, stage 3a: Secondary | ICD-10-CM | POA: Diagnosis not present

## 2022-11-21 DIAGNOSIS — F331 Major depressive disorder, recurrent, moderate: Secondary | ICD-10-CM | POA: Diagnosis not present

## 2022-11-27 DIAGNOSIS — N1832 Chronic kidney disease, stage 3b: Secondary | ICD-10-CM | POA: Diagnosis not present

## 2022-11-27 DIAGNOSIS — E1122 Type 2 diabetes mellitus with diabetic chronic kidney disease: Secondary | ICD-10-CM | POA: Diagnosis not present

## 2022-11-27 DIAGNOSIS — I700 Atherosclerosis of aorta: Secondary | ICD-10-CM | POA: Diagnosis not present

## 2022-11-27 DIAGNOSIS — Z794 Long term (current) use of insulin: Secondary | ICD-10-CM | POA: Diagnosis not present

## 2022-11-27 DIAGNOSIS — M79672 Pain in left foot: Secondary | ICD-10-CM | POA: Diagnosis not present

## 2022-12-04 DIAGNOSIS — E1122 Type 2 diabetes mellitus with diabetic chronic kidney disease: Secondary | ICD-10-CM | POA: Diagnosis not present

## 2022-12-04 DIAGNOSIS — M519 Unspecified thoracic, thoracolumbar and lumbosacral intervertebral disc disorder: Secondary | ICD-10-CM | POA: Diagnosis not present

## 2022-12-04 DIAGNOSIS — I10 Essential (primary) hypertension: Secondary | ICD-10-CM | POA: Diagnosis not present

## 2022-12-04 DIAGNOSIS — K219 Gastro-esophageal reflux disease without esophagitis: Secondary | ICD-10-CM | POA: Diagnosis not present

## 2022-12-04 DIAGNOSIS — I519 Heart disease, unspecified: Secondary | ICD-10-CM | POA: Diagnosis not present

## 2022-12-04 DIAGNOSIS — N1831 Chronic kidney disease, stage 3a: Secondary | ICD-10-CM | POA: Diagnosis not present

## 2022-12-04 DIAGNOSIS — F322 Major depressive disorder, single episode, severe without psychotic features: Secondary | ICD-10-CM | POA: Diagnosis not present

## 2022-12-04 DIAGNOSIS — I700 Atherosclerosis of aorta: Secondary | ICD-10-CM | POA: Diagnosis not present

## 2022-12-04 DIAGNOSIS — N2581 Secondary hyperparathyroidism of renal origin: Secondary | ICD-10-CM | POA: Diagnosis not present

## 2022-12-04 DIAGNOSIS — E782 Mixed hyperlipidemia: Secondary | ICD-10-CM | POA: Diagnosis not present

## 2022-12-09 DIAGNOSIS — E119 Type 2 diabetes mellitus without complications: Secondary | ICD-10-CM | POA: Diagnosis not present

## 2022-12-09 DIAGNOSIS — H5203 Hypermetropia, bilateral: Secondary | ICD-10-CM | POA: Diagnosis not present

## 2022-12-11 ENCOUNTER — Ambulatory Visit (INDEPENDENT_AMBULATORY_CARE_PROVIDER_SITE_OTHER): Payer: PPO | Admitting: Podiatry

## 2022-12-11 DIAGNOSIS — S90112A Contusion of left great toe without damage to nail, initial encounter: Secondary | ICD-10-CM

## 2022-12-11 DIAGNOSIS — S9422XS Injury of deep peroneal nerve at ankle and foot level, left leg, sequela: Secondary | ICD-10-CM | POA: Diagnosis not present

## 2022-12-11 DIAGNOSIS — N2581 Secondary hyperparathyroidism of renal origin: Secondary | ICD-10-CM | POA: Insufficient documentation

## 2022-12-11 DIAGNOSIS — M25562 Pain in left knee: Secondary | ICD-10-CM | POA: Insufficient documentation

## 2022-12-11 NOTE — Progress Notes (Signed)
  Subjective:  Patient ID: Tina Briggs, female    DOB: Jul 20, 1946,  MRN: [id number]  Chief Complaint  Patient presents with   Diabetes    Diabetic foot care - left foot "feels like a brick" when she first gets up in the morning - also left great toenail has "stopped growing"- this is concerning to her    77 y.o. female presents with the above complaint. History confirmed with patient.  She previously had surgery on this left leg to alleviate a nerve entrapment from a neurosurgeon.  She does sleep on the left side.  She also notes that she previously had a pair of boots that became very tight on the big toe and cause pressure on it was sore for some time for several weeks after.  Objective:  Physical Exam: warm, good capillary refill, no trophic changes or ulcerative lesions, normal DP and PT pulses, normal sensory exam, and pain and tenderness around the distribution of peroneal nerve, no onycholysis noted no onychomycosis noted left hallux.  Assessment:   1. 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 Contusion of left great toe without damage to nail, initial encounter      Plan:  Patient was evaluated and treated and all questions answered.  We discussed that the symptoms she is having is likely related to the previous peroneal nerve issue and is similar, I recommended she not sleep on her left side with pressure directly on the leg.  Try sleeping on contralateral limb or on her back with elevation so that her reflux does not bother her.  Discussed if she has a sleep in the left side then she should put a pillow on the thigh and calf so that the fibular head is not having any pressure.  Regarding her nail issue it appears to be healing well it has not grown much but this may take several months for it to fully grow out.  Discussed possibility of onycholysis or nail dystrophy due to pressure on the nail there is no clear evidence of onychomycosis or onycholysis  currently if this develops or worsens she will return to see me as needed.  No follow-ups on file.

## 2022-12-16 DIAGNOSIS — E119 Type 2 diabetes mellitus without complications: Secondary | ICD-10-CM | POA: Diagnosis not present

## 2023-01-03 DIAGNOSIS — I10 Essential (primary) hypertension: Secondary | ICD-10-CM | POA: Diagnosis not present

## 2023-01-03 DIAGNOSIS — E119 Type 2 diabetes mellitus without complications: Secondary | ICD-10-CM | POA: Diagnosis not present

## 2023-01-15 DIAGNOSIS — E119 Type 2 diabetes mellitus without complications: Secondary | ICD-10-CM | POA: Diagnosis not present

## 2023-02-14 DIAGNOSIS — E119 Type 2 diabetes mellitus without complications: Secondary | ICD-10-CM | POA: Diagnosis not present

## 2023-02-14 DIAGNOSIS — F331 Major depressive disorder, recurrent, moderate: Secondary | ICD-10-CM | POA: Diagnosis not present

## 2023-02-14 DIAGNOSIS — E1122 Type 2 diabetes mellitus with diabetic chronic kidney disease: Secondary | ICD-10-CM | POA: Diagnosis not present

## 2023-02-14 DIAGNOSIS — E782 Mixed hyperlipidemia: Secondary | ICD-10-CM | POA: Diagnosis not present

## 2023-02-14 DIAGNOSIS — K219 Gastro-esophageal reflux disease without esophagitis: Secondary | ICD-10-CM | POA: Diagnosis not present

## 2023-02-14 DIAGNOSIS — N1832 Chronic kidney disease, stage 3b: Secondary | ICD-10-CM | POA: Diagnosis not present

## 2023-02-14 DIAGNOSIS — I10 Essential (primary) hypertension: Secondary | ICD-10-CM | POA: Diagnosis not present

## 2023-02-17 ENCOUNTER — Ambulatory Visit (INDEPENDENT_AMBULATORY_CARE_PROVIDER_SITE_OTHER): Payer: PPO | Admitting: Ophthalmology

## 2023-02-17 ENCOUNTER — Encounter (INDEPENDENT_AMBULATORY_CARE_PROVIDER_SITE_OTHER): Payer: Self-pay | Admitting: Ophthalmology

## 2023-02-17 DIAGNOSIS — Z794 Long term (current) use of insulin: Secondary | ICD-10-CM

## 2023-02-17 DIAGNOSIS — E119 Type 2 diabetes mellitus without complications: Secondary | ICD-10-CM

## 2023-02-17 DIAGNOSIS — H353132 Nonexudative age-related macular degeneration, bilateral, intermediate dry stage: Secondary | ICD-10-CM

## 2023-02-17 DIAGNOSIS — I10 Essential (primary) hypertension: Secondary | ICD-10-CM

## 2023-02-17 DIAGNOSIS — Z961 Presence of intraocular lens: Secondary | ICD-10-CM

## 2023-02-17 DIAGNOSIS — Z7984 Long term (current) use of oral hypoglycemic drugs: Secondary | ICD-10-CM

## 2023-02-17 DIAGNOSIS — Z7985 Long-term (current) use of injectable non-insulin antidiabetic drugs: Secondary | ICD-10-CM

## 2023-02-17 DIAGNOSIS — H35033 Hypertensive retinopathy, bilateral: Secondary | ICD-10-CM | POA: Diagnosis not present

## 2023-02-17 NOTE — Progress Notes (Signed)
[facility] - Clinic Note  02/17/2023     CHIEF COMPLAINT Patient presents for Retina Follow Up    HISTORY OF PRESENT ILLNESS: Tina Briggs is a 77 y.o. female who presents to the clinic today for:  HPI     Retina Follow Up   Patient presents with  Dry AMD.  In both eyes.  Severity is moderate.  Duration of 12 months.  Since onset it is stable.  I, the attending physician,  performed the HPI with the patient and updated documentation appropriately.        Comments   Pt here for 12 mo ret f/u non exu ARMD OU. Pt states VA is about the same. She reports OD gives her more trouble-has been closing it when she's reading.       Last edited by Rennis Chris, MD on 02/18/2023 12:25 AM.    Pt states vision is the same, she notices herself closing her right eye a lot to read, pt saw Dr. Cathey Endow in February   Referring physician: [name], MD [address] [place],  [place] [address]  HISTORICAL INFORMATION:   Selected notes from the MEDICAL RECORD NUMBER Referred by Dr. Dagoberto Ligas to eval AMD OD LEE: 10.18.21 Ocular Hx- AMD OD   CURRENT MEDICATIONS: No current outpatient medications on file. (Ophthalmic Drugs)   No current facility-administered medications for this visit. (Ophthalmic Drugs)   Current Outpatient Medications (Other)  Medication Sig   acetaminophen (TYLENOL) 500 MG tablet Take 1,000 mg by mouth 2 (two) times daily as needed for moderate pain or headache.    aspirin EC 81 MG tablet Take 81 mg by mouth daily.   cholecalciferol (VITAMIN D) 1000 units tablet Take 3,000 Units by mouth daily.   cholestyramine light (PREVALITE) 4 g packet Take 4 g by mouth every other day.   CRESTOR 40 MG tablet TAKE 1 TABLET AT BEDTIME (Patient taking differently: Take 40 mg by mouth at bedtime.)   dapagliflozin propanediol (FARXIGA) 5 MG TABS tablet Take 5 mg by mouth daily.   desloratadine (CLARINEX) 5 MG tablet Take 5 mg by mouth daily.    diclofenac Sodium (VOLTAREN) 1 % GEL Apply 4 g topically 4 (four) times daily.   famotidine (PEPCID) 40 MG tablet Take 40 mg by mouth 2 (two) times daily as needed for heartburn or indigestion.   fluticasone (FLONASE) 50 MCG/ACT nasal spray Place 2 sprays into both nostrils daily as needed for allergies.    FREESTYLE LITE test strip USE 1 STRIP TO CHECK GLUCOSE 4 TIMES DAILY   gabapentin (NEURONTIN) 300 MG capsule Take 300-600 mg by mouth See admin instructions. 300mg  in the morning 600mg  at night   losartan (COZAAR) 25 MG tablet Take 25 mg by mouth 2 (two) times daily.   methocarbamol (ROBAXIN) 500 MG tablet Take 1 tablet (500 mg total) by mouth 2 (two) times daily.   methylPREDNISolone (MEDROL DOSEPAK) 4 MG TBPK tablet Day 1: 8mg  before breakfast, 4 mg after lunch, 4 mg after supper, and 8 mg at bedtime Day 2: 4 mg before breakfast, 4 mg after lunch, 4 mg  after supper, and 8 mg  at bedtime Day 3:  4 mg  before breakfast, 4 mg  after lunch, 4 mg after supper, and 4 mg  at bedtime Day 4: 4 mg  before breakfast, 4 mg  after lunch, and 4 mg at bedtime Day 5: 4 mg  before breakfast and 4 mg  at bedtime Day 6: 4 mg  before breakfast   metoprolol tartrate (LOPRESSOR) 100 MG tablet Take one tablet by mouth 2 hours prior to your CT   Multiple Vitamins-Minerals (PRESERVISION AREDS 2 PO) Take 1 tablet by mouth 2 (two) times daily.   NOVOLOG FLEXPEN RELION 100 UNIT/ML FlexPen Inject 14-16 Units into the skin 3 (three) times daily as needed. BS   venlafaxine XR (EFFEXOR-XR) 75 MG 24 hr capsule Take 75 mg by mouth every morning.    vitamin B-12 (CYANOCOBALAMIN) 1000 MCG tablet Take 1,000 mcg by mouth 3 (three) times a week. Take on Sundays, Wednesdays and Fridays.   No current facility-administered medications for this visit. (Other)   REVIEW OF SYSTEMS: ROS   Positive for: Gastrointestinal, Neurological, Genitourinary, Musculoskeletal, Endocrine, Cardiovascular, Eyes Negative for: Constitutional, Skin,  HENT, Respiratory, Psychiatric, Allergic/Imm, Heme/Lymph Last edited by Thompson Grayer, COT on 02/17/2023  1:13 PM.     ALLERGIES Allergies  Allergen Reactions   Codeine Shortness Of Breath   Clavulanic Acid    Flagyl [Metronidazole]    Other Other (See Comments)   Prevacid [Lansoprazole]    Tetracyclines & Related Itching   Trulicity [Dulaglutide]    Azithromycin Itching and Rash   Erythromycin Itching and Rash   Morphine And Codeine Itching and Rash   Septra [Sulfamethoxazole-Trimethoprim] Itching and Rash   PAST MEDICAL HISTORY 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 Mild to mod TI scarring. (Dr. Eliott Nine)   HNP (herniated nucleus pulposus), lumbar    Recurrent   HYPERLIPIDEMIA-MIXED    HYPERTENSION, UNSPECIFIED    Hypertensive retinopathy    Injury of right hip    Lumbar back pain    Macular degeneration    Microscopic hematuria    Neuropathy    OBESITY    Plantar fasciitis    PONV (postoperative nausea and vomiting)    past history only, "sometimes they put a patch on me"   Right eye injury    Past Surgical History:  Procedure Laterality Date   ABDOMINAL HYSTERECTOMY     APPENDECTOMY     BACK SURGERY     lumbar '09"microdiscectomy"   BREAST BIOPSY     breat Bx benign     CHOLECYSTECTOMY     open   COLONOSCOPY WITH PROPOFOL N/A 11/22/2014   Procedure: COLONOSCOPY WITH PROPOFOL;  Surgeon: Charolett Bumpers, MD;  Location: [facility];  Service: Endoscopy;  Laterality: N/A;   ERCP N/A 05/26/2015   Procedure: ENDOSCOPIC RETROGRADE  CHOLANGIOPANCREATOGRAPHY (ERCP)   (DOING CASE IN MAIN OR);  Surgeon: Dorena Cookey, MD;  Location: [facility];  Service: Gastroenterology;  Laterality: N/A;   ERCP N/A 06/06/2015   Procedure: ENDOSCOPIC RETROGRADE CHOLANGIOPANCREATOGRAPHY (ERCP);  Surgeon: Vida Rigger, MD;  Location: [facility];  Service: Endoscopy;  Laterality: N/A;   ERCP N/A 01/12/2021   Procedure: ENDOSCOPIC RETROGRADE CHOLANGIOPANCREATOGRAPHY (ERCP);  Surgeon: Vida Rigger, MD;  Location: [facility];  Service: Endoscopy;  Laterality: N/A;   ESOPHAGOGASTRODUODENOSCOPY (  EGD) WITH PROPOFOL N/A 11/22/2014   Procedure: ESOPHAGOGASTRODUODENOSCOPY (EGD) WITH PROPOFOL;  Surgeon: Charolett Bumpers, MD;  Location: [facility];  Service: Endoscopy;  Laterality: N/A;   EYE SURGERY Bilateral    cataracts   GANGLION CYST EXCISION Right    LIPOMA EXCISION  1993   From thigh 1993   LUMBAR FUSION  09/16/2018   LUMBAR LAMINECTOMY/DECOMPRESSION MICRODISCECTOMY Left 12/31/2017   Procedure: Laminectomy and Foraminotomy - Lumbar Three-Four - left Extraforaminal diskectomy Lumbar Four-Five left;  Surgeon: Tia Alert, MD;  Location: [facility] OR;  Service: Neurosurgery;  Laterality: Left;   PERONEAL NERVE DECOMPRESSION Left 12/31/2017   Procedure: Left PERONEAL NERVE DECOMPRESSION;  Surgeon: Tia Alert, MD;  Location: [facility] OR;  Service: Neurosurgery;  Laterality: Left;  left   PERONEAL NERVE DECOMPRESSION     REMOVAL OF STONES  01/12/2021   Procedure: REMOVAL OF STONES;  Surgeon: Vida Rigger, MD;  Location: [facility];  Service: Endoscopy;;   Right ganglion cyst     Right ganglion cyst remove     TUBAL LIGATION     FAMILY HISTORY Family History  Problem Relation Age of Onset   Cancer Maternal Aunt        breast cancer   Cancer Maternal Grandmother        kidney cancer    Cancer Paternal Grandmother        bone cancer    Heart disease Mother    Alzheimer's disease Father    Tremor Father        possible PD   Diabetes Mellitus II  Brother    Healthy Son    Healthy Son    SOCIAL HISTORY Social History   Tobacco Use   Smoking status: Never   Smokeless tobacco: Never  Vaping Use   Vaping Use: Never used  Substance Use Topics   Alcohol use: No   Drug use: No       OPHTHALMIC EXAM:  Base Eye Exam     Visual Acuity (Snellen - Linear)       Right Left   Dist cc 20/25 +1 20/20 -2   Dist ph cc NI     Correction: Glasses         Tonometry (Tonopen, 1:18 PM)       Right Left   Pressure 15 16         Pupils       Pupils Dark Light Shape React APD   Right PERRL 3 2 Round Brisk None   Left PERRL 3 2 Round Brisk None         Visual Fields (Counting fingers)       Left Right    Full Full         Extraocular Movement       Right Left    Full, Ortho Full, Ortho         Neuro/Psych     Oriented x3: Yes   Mood/Affect: Normal         Dilation     Both eyes: 1.0% Mydriacyl, 2.5% Phenylephrine @ 1:19 PM           Slit Lamp and Fundus Exam     Slit Lamp Exam       Right Left   Lids/Lashes Dermatochalasis - upper lid Dermatochalasis - upper lid   Conjunctiva/Sclera White and quiet temporal pinguecula   Cornea Mild arcus, trace Punctate epithelial erosions, tear film debris, well healed temporal cataract wounds Mild arcus,  trace, fine Punctate epithelial erosions, tear film debris, well healed temporal cataract wounds   Anterior Chamber deep and clear deep and clear   Iris Round and dilated, No NVI Round and moderately dilated, No NVI   Lens PC IOL in good position with open PC PC IOL in good position   Anterior Vitreous Vitreous syneresis, Posterior vitreous detachment Mild Vitreous syneresis, Posterior vitreous detachment, vitreous condensations         Fundus Exam       Right Left   Disc Pink and Sharp, Compact Pink and Sharp, Compact   C/D Ratio 0.5 0.4   Macula Flat, Blunted foveal reflex, Drusen, RPE mottling and clumping, focal pigment clump IN fovea, No heme  or edema Flat, Blunted foveal reflex, Drusen, RPE mottling and clumping, focal pigment clump nasal fovea, No heme or edema   Vessels attenuated, mild tortuosity attenuated, mild tortuousity, mild AV crossing changes, mild Copper wiring   Periphery Attached, reticular degeneration greatest nasally, atrophic pigmented CR scar at 0730, No heme Attached, reticular degeneration greatest nasally, No heme           Refraction     Wearing Rx       Sphere Cylinder Axis Add   Right -0.50 +0.75 116 +2.50   Left +0.25 +1.00 160 +2.50    Type: prog            IMAGING AND PROCEDURES  Imaging and Procedures for 02/17/2023  OCT, Retina - OU - Both Eyes       Right Eye Quality was good. Central Foveal Thickness: 304. Progression has been stable. Findings include normal foveal contour, no IRF, no SRF, retinal drusen , intraretinal hyper-reflective material, pigment epithelial detachment (Focal RPE irregularity IN fovea, no fluid).   Left Eye Quality was good. Central Foveal Thickness: 298. Progression has been stable. Findings include normal foveal contour, no IRF, no SRF, retinal drusen , intraretinal hyper-reflective material (Focal RPE irregularity nasal fovea -- no fluid).   Notes *Images captured and stored on drive  Diagnosis / Impression:  Non-exu ARMD with drusen and focal RPE disruptions OU -- no significant change from prior  Clinical management:  See below  Abbreviations: NFP - Normal foveal profile. CME - cystoid macular edema. PED - pigment epithelial detachment. IRF - intraretinal fluid. SRF - subretinal fluid. EZ - ellipsoid zone. ERM - epiretinal membrane. ORA - outer retinal atrophy. ORT - outer retinal tubulation. SRHM - subretinal hyper-reflective material. IRHM - intraretinal hyper-reflective material            ASSESSMENT/PLAN:    ICD-10-CM   1. Intermediate stage nonexudative age-related macular degeneration of both eyes  H35.3132 OCT, Retina - OU - Both  Eyes    2. Essential hypertension  I10     3. Hypertensive retinopathy of both eyes  H35.033     4. Diabetes mellitus type 2 without retinopathy (HCC)  E11.9     5. Current use of insulin (HCC)  Z79.4     6. Long term (current) use of oral hypoglycemic drugs  Z79.84     7. Long-term (current) use of injectable non-insulin antidiabetic drugs  Z79.85     8. Pseudophakia of both eyes  Z96.1        1,2. Age related macular degeneration, non-exudative, OU  - intermediate stage w/ drusen and focal RPE disruptions OU -- stable  - The incidence, anatomy, and pathology of dry AMD, risk of progression, and the AREDS and AREDS 2 studies  including smoking risks discussed with patient.  - started on AREDS 2 supplements and [place] Grid monitoring by Dr. Dagoberto Ligas -- continue  - BCVA stable at 20/25 OD, 20/20 OS  - OCT without significant change or exudative disease OU  - f/u 9-12 months, DFE, OCT  3,4. Hypertensive retinopathy OU - discussed importance of tight BP control - monitor  5-7. Diabetes mellitus, type 2 without retinopathy  - A1c was 6.8 on 2.15.22 - The incidence, risk factors for progression, natural history and treatment options for diabetic retinopathy  were discussed with patient.   - The need for close monitoring of blood glucose, blood pressure, and serum lipids, avoiding cigarette or any type of tobacco, and the need for long term follow up was also discussed with patient. - f/u in 1 year, sooner prn  8. Pseudophakia OU  - s/p CE/IOL OU  - IOL in good position, doing well  - monitor  Ophthalmic Meds Ordered this visit:  No orders of the defined types were placed in this encounter.    Return for f/u 9-12 months, non-exu ARMD OU, DFE, OCT.  There are no Patient Instructions on file for this visit.  This document serves as a record of services personally performed by Karie Chimera, MD, PhD. It was created on their behalf by Glee Arvin. Manson Passey, OA an ophthalmic  technician. The creation of this record is the provider's dictation and/or activities during the visit.    Electronically signed by: Glee Arvin. Manson Passey, [place] 05.20.2024 12:25 AM  Karie Chimera, M.D., Ph.D. Diseases & Surgery of the Retina and Vitreous [facility]  I have reviewed the above documentation for accuracy and completeness, and I agree with the above. Karie Chimera, M.D., Ph.D. 02/18/23 12:26 AM   Abbreviations: M myopia (nearsighted); A astigmatism; H hyperopia (farsighted); P presbyopia; Mrx spectacle prescription;  CTL contact lenses; OD right eye; OS left eye; OU both eyes  XT exotropia; ET esotropia; PEK punctate epithelial keratitis; PEE punctate epithelial erosions; DES dry eye syndrome; MGD meibomian gland dysfunction; ATs artificial tears; PFAT's preservative free artificial tears; NSC nuclear sclerotic cataract; PSC posterior subcapsular cataract; ERM epi-retinal membrane; PVD posterior vitreous detachment; RD retinal detachment; DM diabetes mellitus; DR diabetic retinopathy; NPDR non-proliferative diabetic retinopathy; PDR proliferative diabetic retinopathy; CSME clinically significant macular edema; DME diabetic macular edema; dbh dot blot hemorrhages; CWS cotton wool spot; POAG primary open angle glaucoma; C/D cup-to-disc ratio; HVF humphrey visual field; GVF goldmann visual field; OCT optical coherence tomography; IOP intraocular pressure; BRVO Branch retinal vein occlusion; CRVO central retinal vein occlusion; CRAO central retinal artery occlusion; BRAO branch retinal artery occlusion; RT retinal tear; SB scleral buckle; PPV pars plana vitrectomy; VH Vitreous hemorrhage; PRP panretinal laser photocoagulation; IVK intravitreal kenalog; VMT vitreomacular traction; MH Macular hole;  NVD neovascularization of the disc; NVE neovascularization elsewhere; AREDS age related eye disease study; ARMD age related macular degeneration; POAG primary open angle glaucoma;  EBMD epithelial/anterior basement membrane dystrophy; ACIOL anterior chamber intraocular lens; IOL intraocular lens; PCIOL posterior chamber intraocular lens; Phaco/IOL phacoemulsification with intraocular lens placement; PRK photorefractive keratectomy; LASIK laser assisted in situ keratomileusis; HTN hypertension; DM diabetes mellitus; COPD chronic obstructive pulmonary disease

## 2023-02-18 ENCOUNTER — Encounter (INDEPENDENT_AMBULATORY_CARE_PROVIDER_SITE_OTHER): Payer: Self-pay | Admitting: Ophthalmology

## 2023-02-27 DIAGNOSIS — I10 Essential (primary) hypertension: Secondary | ICD-10-CM | POA: Diagnosis not present

## 2023-02-27 DIAGNOSIS — E1122 Type 2 diabetes mellitus with diabetic chronic kidney disease: Secondary | ICD-10-CM | POA: Diagnosis not present

## 2023-02-27 DIAGNOSIS — E1129 Type 2 diabetes mellitus with other diabetic kidney complication: Secondary | ICD-10-CM | POA: Diagnosis not present

## 2023-02-27 DIAGNOSIS — N052 Unspecified nephritic syndrome with diffuse membranous glomerulonephritis: Secondary | ICD-10-CM | POA: Diagnosis not present

## 2023-02-27 DIAGNOSIS — N1831 Chronic kidney disease, stage 3a: Secondary | ICD-10-CM | POA: Diagnosis not present

## 2023-02-27 DIAGNOSIS — K279 Peptic ulcer, site unspecified, unspecified as acute or chronic, without hemorrhage or perforation: Secondary | ICD-10-CM | POA: Diagnosis not present

## 2023-03-02 DIAGNOSIS — Z20822 Contact with and (suspected) exposure to covid-19: Secondary | ICD-10-CM | POA: Diagnosis not present

## 2023-03-02 DIAGNOSIS — R0981 Nasal congestion: Secondary | ICD-10-CM | POA: Diagnosis not present

## 2023-03-02 DIAGNOSIS — R5383 Other fatigue: Secondary | ICD-10-CM | POA: Diagnosis not present

## 2023-03-02 DIAGNOSIS — J029 Acute pharyngitis, unspecified: Secondary | ICD-10-CM | POA: Diagnosis not present

## 2023-03-03 DIAGNOSIS — Z20822 Contact with and (suspected) exposure to covid-19: Secondary | ICD-10-CM | POA: Diagnosis not present

## 2023-03-03 DIAGNOSIS — R0981 Nasal congestion: Secondary | ICD-10-CM | POA: Diagnosis not present

## 2023-03-03 DIAGNOSIS — R5383 Other fatigue: Secondary | ICD-10-CM | POA: Diagnosis not present

## 2023-03-03 DIAGNOSIS — J029 Acute pharyngitis, unspecified: Secondary | ICD-10-CM | POA: Diagnosis not present

## 2023-03-04 DIAGNOSIS — R0981 Nasal congestion: Secondary | ICD-10-CM | POA: Diagnosis not present

## 2023-03-04 DIAGNOSIS — Z20822 Contact with and (suspected) exposure to covid-19: Secondary | ICD-10-CM | POA: Diagnosis not present

## 2023-03-04 DIAGNOSIS — R5383 Other fatigue: Secondary | ICD-10-CM | POA: Diagnosis not present

## 2023-03-04 DIAGNOSIS — J029 Acute pharyngitis, unspecified: Secondary | ICD-10-CM | POA: Diagnosis not present

## 2023-03-13 DIAGNOSIS — I10 Essential (primary) hypertension: Secondary | ICD-10-CM | POA: Diagnosis not present

## 2023-03-13 DIAGNOSIS — N189 Chronic kidney disease, unspecified: Secondary | ICD-10-CM | POA: Diagnosis not present

## 2023-03-13 DIAGNOSIS — E1122 Type 2 diabetes mellitus with diabetic chronic kidney disease: Secondary | ICD-10-CM | POA: Diagnosis not present

## 2023-03-13 DIAGNOSIS — E782 Mixed hyperlipidemia: Secondary | ICD-10-CM | POA: Diagnosis not present

## 2023-03-13 DIAGNOSIS — K219 Gastro-esophageal reflux disease without esophagitis: Secondary | ICD-10-CM | POA: Diagnosis not present

## 2023-03-17 DIAGNOSIS — E119 Type 2 diabetes mellitus without complications: Secondary | ICD-10-CM | POA: Diagnosis not present

## 2023-05-05 DIAGNOSIS — Z6829 Body mass index (BMI) 29.0-29.9, adult: Secondary | ICD-10-CM | POA: Diagnosis not present

## 2023-05-05 DIAGNOSIS — N959 Unspecified menopausal and perimenopausal disorder: Secondary | ICD-10-CM | POA: Diagnosis not present

## 2023-05-05 DIAGNOSIS — K219 Gastro-esophageal reflux disease without esophagitis: Secondary | ICD-10-CM | POA: Diagnosis not present

## 2023-05-05 DIAGNOSIS — Z13820 Encounter for screening for osteoporosis: Secondary | ICD-10-CM | POA: Diagnosis not present

## 2023-05-05 DIAGNOSIS — Z01419 Encounter for gynecological examination (general) (routine) without abnormal findings: Secondary | ICD-10-CM | POA: Diagnosis not present

## 2023-05-05 DIAGNOSIS — Z1231 Encounter for screening mammogram for malignant neoplasm of breast: Secondary | ICD-10-CM | POA: Diagnosis not present

## 2023-05-05 DIAGNOSIS — N958 Other specified menopausal and perimenopausal disorders: Secondary | ICD-10-CM | POA: Diagnosis not present

## 2023-05-05 DIAGNOSIS — M8588 Other specified disorders of bone density and structure, other site: Secondary | ICD-10-CM | POA: Diagnosis not present

## 2023-05-07 ENCOUNTER — Other Ambulatory Visit: Payer: Self-pay | Admitting: Obstetrics and Gynecology

## 2023-05-07 DIAGNOSIS — R1011 Right upper quadrant pain: Secondary | ICD-10-CM | POA: Diagnosis not present

## 2023-05-07 DIAGNOSIS — R928 Other abnormal and inconclusive findings on diagnostic imaging of breast: Secondary | ICD-10-CM

## 2023-05-10 DIAGNOSIS — L089 Local infection of the skin and subcutaneous tissue, unspecified: Secondary | ICD-10-CM | POA: Diagnosis not present

## 2023-05-14 ENCOUNTER — Ambulatory Visit (INDEPENDENT_AMBULATORY_CARE_PROVIDER_SITE_OTHER): Payer: PPO

## 2023-05-14 ENCOUNTER — Ambulatory Visit: Payer: PPO | Admitting: Podiatry

## 2023-05-14 DIAGNOSIS — S90112A Contusion of left great toe without damage to nail, initial encounter: Secondary | ICD-10-CM

## 2023-05-14 DIAGNOSIS — L03032 Cellulitis of left toe: Secondary | ICD-10-CM

## 2023-05-14 DIAGNOSIS — L02612 Cutaneous abscess of left foot: Secondary | ICD-10-CM

## 2023-05-14 MED ORDER — CEPHALEXIN 500 MG PO CAPS: 500.0000 mg | ORAL_CAPSULE | Freq: Three times a day (TID) | ORAL | 1 refills | Status: DC

## 2023-05-14 MED ORDER — HYDROCODONE-ACETAMINOPHEN 10-325 MG PO TABS: 1.0000 | ORAL_TABLET | Freq: Three times a day (TID) | ORAL | 0 refills | Status: AC | PRN

## 2023-05-14 NOTE — Patient Instructions (Signed)

## 2023-05-15 ENCOUNTER — Other Ambulatory Visit: Payer: Self-pay | Admitting: Obstetrics and Gynecology

## 2023-05-15 ENCOUNTER — Ambulatory Visit
Admission: RE | Admit: 2023-05-15 | Discharge: 2023-05-15 | Disposition: A | Discharge status: Home / Self Care | Payer: PPO | Source: Ambulatory Visit | Attending: Obstetrics and Gynecology | Admitting: Obstetrics and Gynecology

## 2023-05-15 ENCOUNTER — Other Ambulatory Visit: Payer: Self-pay | Admitting: Gastroenterology

## 2023-05-15 DIAGNOSIS — K838 Other specified diseases of biliary tract: Secondary | ICD-10-CM

## 2023-05-15 DIAGNOSIS — R1011 Right upper quadrant pain: Secondary | ICD-10-CM | POA: Diagnosis not present

## 2023-05-15 DIAGNOSIS — R109 Unspecified abdominal pain: Secondary | ICD-10-CM

## 2023-05-15 DIAGNOSIS — R921 Mammographic calcification found on diagnostic imaging of breast: Secondary | ICD-10-CM | POA: Diagnosis not present

## 2023-05-15 DIAGNOSIS — R928 Other abnormal and inconclusive findings on diagnostic imaging of breast: Secondary | ICD-10-CM

## 2023-05-15 NOTE — Progress Notes (Signed)
Subjective:   Patient ID: Napoleon Form, female   DOB: 77 y.o.   MRN: [id number]   HPI Patient presents with a swollen left big toe that she went to urgent care and has been on doxycycline for 3 days which has made no difference.  States it is very tender when pressed she is a diabetic   ROS      Objective:  Physical Exam  Neurovascular status intact with no change other history with patient found to have significant redness of the proximal nail fold left hallux with a lot of distended pressure in the area no proximal edema erythema drainage past the inner phalangeal joint with good digital perfusion     Assessment:  Probability for infection of the proximal nail fold across the entire base versus 1 side     Plan:  H&P precautionary x-ray taken anesthetized the left big toe 60 mg like Marcaine mixture sterile prep done and I went ahead I remove the nail.  I did note that there is what appears to be purulent bacteria consistent with staph in the proximal nail fold I flushed it all out I cleaned out all the abscess present did not note any bone or other tissue extension we are going to switch her over to cephalexin that she is having trouble tolerating the doxycycline and I gave her strict instructions of any changes were to occur she is to let us know immediately and we will begin soaks within 1 day  X-rays indicate no signs at this is a bone issue appears to be soft tissue

## 2023-05-19 ENCOUNTER — Ambulatory Visit
Admission: RE | Admit: 2023-05-19 | Discharge: 2023-05-19 | Disposition: A | Discharge status: Home / Self Care | Payer: PPO | Source: Ambulatory Visit | Attending: Obstetrics and Gynecology | Admitting: Obstetrics and Gynecology

## 2023-05-19 DIAGNOSIS — R928 Other abnormal and inconclusive findings on diagnostic imaging of breast: Secondary | ICD-10-CM

## 2023-05-19 DIAGNOSIS — R921 Mammographic calcification found on diagnostic imaging of breast: Secondary | ICD-10-CM

## 2023-05-19 DIAGNOSIS — N61 Mastitis without abscess: Secondary | ICD-10-CM | POA: Diagnosis not present

## 2023-05-19 DIAGNOSIS — N6011 Diffuse cystic mastopathy of right breast: Secondary | ICD-10-CM | POA: Diagnosis not present

## 2023-05-19 HISTORY — PX: BREAST BIOPSY: SHX20

## 2023-05-21 DIAGNOSIS — E119 Type 2 diabetes mellitus without complications: Secondary | ICD-10-CM | POA: Diagnosis not present

## 2023-05-29 ENCOUNTER — Other Ambulatory Visit: Payer: Self-pay

## 2023-05-29 DIAGNOSIS — E1122 Type 2 diabetes mellitus with diabetic chronic kidney disease: Secondary | ICD-10-CM | POA: Diagnosis not present

## 2023-05-29 MED ORDER — COLESTIPOL HCL 1 G PO TABS
2.0000 g | ORAL_TABLET | Freq: Two times a day (BID) | ORAL | 12 refills | Status: DC
  Filled 2023-06-24: qty 120, 30d supply, fill #0
  Filled 2023-07-30: qty 120, 30d supply, fill #1

## 2023-05-29 MED ORDER — ROSUVASTATIN CALCIUM 40 MG PO TABS
40.0000 mg | ORAL_TABLET | Freq: Every day | ORAL | 3 refills | Status: DC
  Filled 2023-06-24: qty 90, 90d supply, fill #0

## 2023-05-29 MED ORDER — OZEMPIC (0.25 OR 0.5 MG/DOSE) 2 MG/3ML ~~LOC~~ SOPN: 0.5000 mg | PEN_INJECTOR | SUBCUTANEOUS | 1 refills | Status: DC

## 2023-05-29 MED ORDER — LOSARTAN POTASSIUM 50 MG PO TABS
50.0000 mg | ORAL_TABLET | Freq: Two times a day (BID) | ORAL | 5 refills | Status: DC
  Filled 2023-06-24: qty 60, 30d supply, fill #0
  Filled 2023-08-01: qty 60, 30d supply, fill #1

## 2023-05-29 MED ORDER — DESLORATADINE 5 MG PO TABS: 5.0000 mg | ORAL_TABLET | Freq: Every day | ORAL | 1 refills | Status: DC

## 2023-06-04 DIAGNOSIS — E1122 Type 2 diabetes mellitus with diabetic chronic kidney disease: Secondary | ICD-10-CM | POA: Diagnosis not present

## 2023-06-13 DIAGNOSIS — N1831 Chronic kidney disease, stage 3a: Secondary | ICD-10-CM | POA: Diagnosis not present

## 2023-06-13 DIAGNOSIS — F331 Major depressive disorder, recurrent, moderate: Secondary | ICD-10-CM | POA: Diagnosis not present

## 2023-06-13 DIAGNOSIS — G629 Polyneuropathy, unspecified: Secondary | ICD-10-CM | POA: Diagnosis not present

## 2023-06-13 DIAGNOSIS — I10 Essential (primary) hypertension: Secondary | ICD-10-CM | POA: Diagnosis not present

## 2023-06-13 DIAGNOSIS — I700 Atherosclerosis of aorta: Secondary | ICD-10-CM | POA: Diagnosis not present

## 2023-06-13 DIAGNOSIS — N052 Unspecified nephritic syndrome with diffuse membranous glomerulonephritis: Secondary | ICD-10-CM | POA: Diagnosis not present

## 2023-06-13 DIAGNOSIS — I519 Heart disease, unspecified: Secondary | ICD-10-CM | POA: Diagnosis not present

## 2023-06-13 DIAGNOSIS — Z1331 Encounter for screening for depression: Secondary | ICD-10-CM | POA: Diagnosis not present

## 2023-06-13 DIAGNOSIS — Z0000 Encounter for general adult medical examination without abnormal findings: Secondary | ICD-10-CM | POA: Diagnosis not present

## 2023-06-13 DIAGNOSIS — I2510 Atherosclerotic heart disease of native coronary artery without angina pectoris: Secondary | ICD-10-CM | POA: Diagnosis not present

## 2023-06-13 DIAGNOSIS — E1122 Type 2 diabetes mellitus with diabetic chronic kidney disease: Secondary | ICD-10-CM | POA: Diagnosis not present

## 2023-06-13 DIAGNOSIS — N2581 Secondary hyperparathyroidism of renal origin: Secondary | ICD-10-CM | POA: Diagnosis not present

## 2023-06-13 DIAGNOSIS — I340 Nonrheumatic mitral (valve) insufficiency: Secondary | ICD-10-CM | POA: Diagnosis not present

## 2023-06-16 ENCOUNTER — Other Ambulatory Visit: Payer: Self-pay

## 2023-06-18 DIAGNOSIS — N1831 Chronic kidney disease, stage 3a: Secondary | ICD-10-CM | POA: Diagnosis not present

## 2023-06-18 DIAGNOSIS — E1122 Type 2 diabetes mellitus with diabetic chronic kidney disease: Secondary | ICD-10-CM | POA: Diagnosis not present

## 2023-06-18 DIAGNOSIS — I10 Essential (primary) hypertension: Secondary | ICD-10-CM | POA: Diagnosis not present

## 2023-06-18 DIAGNOSIS — N052 Unspecified nephritic syndrome with diffuse membranous glomerulonephritis: Secondary | ICD-10-CM | POA: Diagnosis not present

## 2023-06-20 DIAGNOSIS — E119 Type 2 diabetes mellitus without complications: Secondary | ICD-10-CM | POA: Diagnosis not present

## 2023-06-25 ENCOUNTER — Other Ambulatory Visit: Payer: Self-pay

## 2023-06-26 ENCOUNTER — Other Ambulatory Visit: Payer: Self-pay

## 2023-06-26 ENCOUNTER — Other Ambulatory Visit: Payer: PPO

## 2023-07-31 ENCOUNTER — Ambulatory Visit
Admission: RE | Admit: 2023-07-31 | Discharge: 2023-07-31 | Disposition: A | Discharge status: Home / Self Care | Payer: PPO | Source: Ambulatory Visit | Attending: Gastroenterology | Admitting: Gastroenterology

## 2023-07-31 DIAGNOSIS — R1011 Right upper quadrant pain: Secondary | ICD-10-CM | POA: Diagnosis not present

## 2023-07-31 DIAGNOSIS — K838 Other specified diseases of biliary tract: Secondary | ICD-10-CM

## 2023-07-31 DIAGNOSIS — K839 Disease of biliary tract, unspecified: Secondary | ICD-10-CM | POA: Diagnosis not present

## 2023-07-31 DIAGNOSIS — R109 Unspecified abdominal pain: Secondary | ICD-10-CM

## 2023-07-31 DIAGNOSIS — K449 Diaphragmatic hernia without obstruction or gangrene: Secondary | ICD-10-CM | POA: Diagnosis not present

## 2023-07-31 MED ORDER — GADOPICLENOL 0.5 MMOL/ML IV SOLN
7.5000 mL | Freq: Once | INTRAVENOUS | Status: AC | PRN
  Administered 2023-07-31: 7.5 mL via INTRAVENOUS

## 2023-08-01 ENCOUNTER — Other Ambulatory Visit (HOSPITAL_BASED_OUTPATIENT_CLINIC_OR_DEPARTMENT_OTHER): Payer: Self-pay

## 2023-08-04 DIAGNOSIS — K831 Obstruction of bile duct: Secondary | ICD-10-CM | POA: Diagnosis not present

## 2023-08-06 ENCOUNTER — Emergency Department (HOSPITAL_BASED_OUTPATIENT_CLINIC_OR_DEPARTMENT_OTHER): Payer: PPO

## 2023-08-06 ENCOUNTER — Inpatient Hospital Stay (HOSPITAL_BASED_OUTPATIENT_CLINIC_OR_DEPARTMENT_OTHER)
Admission: EM | Admit: 2023-08-06 | Discharge: 2023-08-19 | DRG: 871 | Disposition: A | Discharge status: Home Health | Payer: PPO | Attending: Internal Medicine | Admitting: Internal Medicine

## 2023-08-06 ENCOUNTER — Other Ambulatory Visit: Payer: Self-pay

## 2023-08-06 ENCOUNTER — Encounter (HOSPITAL_BASED_OUTPATIENT_CLINIC_OR_DEPARTMENT_OTHER): Payer: Self-pay | Admitting: Emergency Medicine

## 2023-08-06 ENCOUNTER — Inpatient Hospital Stay (HOSPITAL_COMMUNITY): Payer: PPO

## 2023-08-06 DIAGNOSIS — E871 Hypo-osmolality and hyponatremia: Secondary | ICD-10-CM | POA: Diagnosis not present

## 2023-08-06 DIAGNOSIS — R9431 Abnormal electrocardiogram [ECG] [EKG]: Secondary | ICD-10-CM | POA: Diagnosis not present

## 2023-08-06 DIAGNOSIS — R748 Abnormal levels of other serum enzymes: Secondary | ICD-10-CM | POA: Diagnosis not present

## 2023-08-06 DIAGNOSIS — K8031 Calculus of bile duct with cholangitis, unspecified, with obstruction: Secondary | ICD-10-CM | POA: Diagnosis present

## 2023-08-06 DIAGNOSIS — I129 Hypertensive chronic kidney disease with stage 1 through stage 4 chronic kidney disease, or unspecified chronic kidney disease: Secondary | ICD-10-CM | POA: Diagnosis not present

## 2023-08-06 DIAGNOSIS — Z860100 Personal history of colon polyps, unspecified: Secondary | ICD-10-CM

## 2023-08-06 DIAGNOSIS — G9341 Metabolic encephalopathy: Secondary | ICD-10-CM | POA: Diagnosis not present

## 2023-08-06 DIAGNOSIS — F32A Depression, unspecified: Secondary | ICD-10-CM | POA: Diagnosis present

## 2023-08-06 DIAGNOSIS — T402X5A Adverse effect of other opioids, initial encounter: Secondary | ICD-10-CM | POA: Diagnosis not present

## 2023-08-06 DIAGNOSIS — K2980 Duodenitis without bleeding: Secondary | ICD-10-CM | POA: Diagnosis not present

## 2023-08-06 DIAGNOSIS — E1165 Type 2 diabetes mellitus with hyperglycemia: Secondary | ICD-10-CM | POA: Diagnosis not present

## 2023-08-06 DIAGNOSIS — R110 Nausea: Secondary | ICD-10-CM | POA: Diagnosis not present

## 2023-08-06 DIAGNOSIS — R933 Abnormal findings on diagnostic imaging of other parts of digestive tract: Secondary | ICD-10-CM | POA: Diagnosis not present

## 2023-08-06 DIAGNOSIS — R29810 Facial weakness: Secondary | ICD-10-CM | POA: Diagnosis not present

## 2023-08-06 DIAGNOSIS — Y848 Other medical procedures as the cause of abnormal reaction of the patient, or of later complication, without mention of misadventure at the time of the procedure: Secondary | ICD-10-CM | POA: Diagnosis not present

## 2023-08-06 DIAGNOSIS — R571 Hypovolemic shock: Secondary | ICD-10-CM | POA: Diagnosis not present

## 2023-08-06 DIAGNOSIS — K529 Noninfective gastroenteritis and colitis, unspecified: Secondary | ICD-10-CM | POA: Diagnosis present

## 2023-08-06 DIAGNOSIS — K8030 Calculus of bile duct with cholangitis, unspecified, without obstruction: Secondary | ICD-10-CM | POA: Diagnosis not present

## 2023-08-06 DIAGNOSIS — D631 Anemia in chronic kidney disease: Secondary | ICD-10-CM | POA: Diagnosis not present

## 2023-08-06 DIAGNOSIS — M7989 Other specified soft tissue disorders: Secondary | ICD-10-CM | POA: Diagnosis not present

## 2023-08-06 DIAGNOSIS — Z9049 Acquired absence of other specified parts of digestive tract: Secondary | ICD-10-CM | POA: Diagnosis not present

## 2023-08-06 DIAGNOSIS — K838 Other specified diseases of biliary tract: Secondary | ICD-10-CM | POA: Diagnosis not present

## 2023-08-06 DIAGNOSIS — T8089XA Other complications following infusion, transfusion and therapeutic injection, initial encounter: Secondary | ICD-10-CM | POA: Diagnosis not present

## 2023-08-06 DIAGNOSIS — R4701 Aphasia: Secondary | ICD-10-CM | POA: Diagnosis not present

## 2023-08-06 DIAGNOSIS — F419 Anxiety disorder, unspecified: Secondary | ICD-10-CM | POA: Diagnosis present

## 2023-08-06 DIAGNOSIS — R5381 Other malaise: Secondary | ICD-10-CM | POA: Diagnosis not present

## 2023-08-06 DIAGNOSIS — A419 Sepsis, unspecified organism: Secondary | ICD-10-CM | POA: Diagnosis present

## 2023-08-06 DIAGNOSIS — E860 Dehydration: Secondary | ICD-10-CM | POA: Diagnosis present

## 2023-08-06 DIAGNOSIS — R7401 Elevation of levels of liver transaminase levels: Secondary | ICD-10-CM | POA: Diagnosis not present

## 2023-08-06 DIAGNOSIS — R932 Abnormal findings on diagnostic imaging of liver and biliary tract: Secondary | ICD-10-CM | POA: Diagnosis not present

## 2023-08-06 DIAGNOSIS — E785 Hyperlipidemia, unspecified: Secondary | ICD-10-CM | POA: Diagnosis present

## 2023-08-06 DIAGNOSIS — J9601 Acute respiratory failure with hypoxia: Secondary | ICD-10-CM | POA: Diagnosis present

## 2023-08-06 DIAGNOSIS — E872 Acidosis, unspecified: Secondary | ICD-10-CM | POA: Diagnosis present

## 2023-08-06 DIAGNOSIS — Z8051 Family history of malignant neoplasm of kidney: Secondary | ICD-10-CM

## 2023-08-06 DIAGNOSIS — Z90710 Acquired absence of both cervix and uterus: Secondary | ICD-10-CM | POA: Diagnosis not present

## 2023-08-06 DIAGNOSIS — D6959 Other secondary thrombocytopenia: Secondary | ICD-10-CM | POA: Diagnosis present

## 2023-08-06 DIAGNOSIS — E1140 Type 2 diabetes mellitus with diabetic neuropathy, unspecified: Secondary | ICD-10-CM | POA: Diagnosis not present

## 2023-08-06 DIAGNOSIS — I4891 Unspecified atrial fibrillation: Secondary | ICD-10-CM | POA: Diagnosis not present

## 2023-08-06 DIAGNOSIS — N1832 Chronic kidney disease, stage 3b: Secondary | ICD-10-CM | POA: Diagnosis present

## 2023-08-06 DIAGNOSIS — Z86718 Personal history of other venous thrombosis and embolism: Secondary | ICD-10-CM

## 2023-08-06 DIAGNOSIS — Z882 Allergy status to sulfonamides status: Secondary | ICD-10-CM

## 2023-08-06 DIAGNOSIS — B9689 Other specified bacterial agents as the cause of diseases classified elsewhere: Secondary | ICD-10-CM | POA: Diagnosis present

## 2023-08-06 DIAGNOSIS — R6521 Severe sepsis with septic shock: Secondary | ICD-10-CM | POA: Diagnosis not present

## 2023-08-06 DIAGNOSIS — I9589 Other hypotension: Secondary | ICD-10-CM | POA: Diagnosis not present

## 2023-08-06 DIAGNOSIS — E11649 Type 2 diabetes mellitus with hypoglycemia without coma: Secondary | ICD-10-CM | POA: Diagnosis not present

## 2023-08-06 DIAGNOSIS — R4182 Altered mental status, unspecified: Secondary | ICD-10-CM | POA: Diagnosis not present

## 2023-08-06 DIAGNOSIS — E8809 Other disorders of plasma-protein metabolism, not elsewhere classified: Secondary | ICD-10-CM | POA: Diagnosis not present

## 2023-08-06 DIAGNOSIS — E669 Obesity, unspecified: Secondary | ICD-10-CM | POA: Diagnosis not present

## 2023-08-06 DIAGNOSIS — D689 Coagulation defect, unspecified: Secondary | ICD-10-CM | POA: Diagnosis present

## 2023-08-06 DIAGNOSIS — K449 Diaphragmatic hernia without obstruction or gangrene: Secondary | ICD-10-CM | POA: Diagnosis not present

## 2023-08-06 DIAGNOSIS — Z820 Family history of epilepsy and other diseases of the nervous system: Secondary | ICD-10-CM

## 2023-08-06 DIAGNOSIS — Z794 Long term (current) use of insulin: Secondary | ICD-10-CM | POA: Diagnosis not present

## 2023-08-06 DIAGNOSIS — Z8249 Family history of ischemic heart disease and other diseases of the circulatory system: Secondary | ICD-10-CM

## 2023-08-06 DIAGNOSIS — Z7984 Long term (current) use of oral hypoglycemic drugs: Secondary | ICD-10-CM

## 2023-08-06 DIAGNOSIS — Z6835 Body mass index (BMI) 35.0-35.9, adult: Secondary | ICD-10-CM

## 2023-08-06 DIAGNOSIS — E1122 Type 2 diabetes mellitus with diabetic chronic kidney disease: Secondary | ICD-10-CM | POA: Diagnosis not present

## 2023-08-06 DIAGNOSIS — Z79899 Other long term (current) drug therapy: Secondary | ICD-10-CM

## 2023-08-06 DIAGNOSIS — R7881 Bacteremia: Secondary | ICD-10-CM | POA: Diagnosis not present

## 2023-08-06 DIAGNOSIS — Z419 Encounter for procedure for purposes other than remedying health state, unspecified: Secondary | ICD-10-CM

## 2023-08-06 DIAGNOSIS — Z888 Allergy status to other drugs, medicaments and biological substances status: Secondary | ICD-10-CM

## 2023-08-06 DIAGNOSIS — Z803 Family history of malignant neoplasm of breast: Secondary | ICD-10-CM

## 2023-08-06 DIAGNOSIS — Z833 Family history of diabetes mellitus: Secondary | ICD-10-CM

## 2023-08-06 DIAGNOSIS — A4151 Sepsis due to Escherichia coli [E. coli]: Principal | ICD-10-CM | POA: Diagnosis present

## 2023-08-06 DIAGNOSIS — Z1152 Encounter for screening for COVID-19: Secondary | ICD-10-CM

## 2023-08-06 DIAGNOSIS — E7800 Pure hypercholesterolemia, unspecified: Secondary | ICD-10-CM | POA: Diagnosis present

## 2023-08-06 DIAGNOSIS — N183 Chronic kidney disease, stage 3 unspecified: Secondary | ICD-10-CM | POA: Diagnosis not present

## 2023-08-06 DIAGNOSIS — K8309 Other cholangitis: Secondary | ICD-10-CM | POA: Diagnosis not present

## 2023-08-06 DIAGNOSIS — K219 Gastro-esophageal reflux disease without esophagitis: Secondary | ICD-10-CM | POA: Diagnosis present

## 2023-08-06 DIAGNOSIS — K5732 Diverticulitis of large intestine without perforation or abscess without bleeding: Secondary | ICD-10-CM | POA: Diagnosis not present

## 2023-08-06 DIAGNOSIS — R29818 Other symptoms and signs involving the nervous system: Secondary | ICD-10-CM | POA: Diagnosis not present

## 2023-08-06 DIAGNOSIS — R197 Diarrhea, unspecified: Secondary | ICD-10-CM | POA: Diagnosis not present

## 2023-08-06 DIAGNOSIS — I959 Hypotension, unspecified: Secondary | ICD-10-CM

## 2023-08-06 DIAGNOSIS — N179 Acute kidney failure, unspecified: Secondary | ICD-10-CM | POA: Diagnosis not present

## 2023-08-06 DIAGNOSIS — Z7985 Long-term (current) use of injectable non-insulin antidiabetic drugs: Secondary | ICD-10-CM

## 2023-08-06 DIAGNOSIS — K5750 Diverticulosis of both small and large intestine without perforation or abscess without bleeding: Secondary | ICD-10-CM | POA: Diagnosis not present

## 2023-08-06 DIAGNOSIS — H3530 Unspecified macular degeneration: Secondary | ICD-10-CM | POA: Diagnosis present

## 2023-08-06 DIAGNOSIS — Z981 Arthrodesis status: Secondary | ICD-10-CM

## 2023-08-06 DIAGNOSIS — R109 Unspecified abdominal pain: Secondary | ICD-10-CM | POA: Diagnosis not present

## 2023-08-06 DIAGNOSIS — I6782 Cerebral ischemia: Secondary | ICD-10-CM | POA: Diagnosis not present

## 2023-08-06 DIAGNOSIS — L299 Pruritus, unspecified: Secondary | ICD-10-CM | POA: Diagnosis not present

## 2023-08-06 DIAGNOSIS — Z885 Allergy status to narcotic agent status: Secondary | ICD-10-CM

## 2023-08-06 DIAGNOSIS — K5710 Diverticulosis of small intestine without perforation or abscess without bleeding: Secondary | ICD-10-CM | POA: Diagnosis present

## 2023-08-06 DIAGNOSIS — Z0389 Encounter for observation for other suspected diseases and conditions ruled out: Secondary | ICD-10-CM | POA: Diagnosis not present

## 2023-08-06 DIAGNOSIS — Z881 Allergy status to other antibiotic agents status: Secondary | ICD-10-CM

## 2023-08-06 DIAGNOSIS — Z809 Family history of malignant neoplasm, unspecified: Secondary | ICD-10-CM

## 2023-08-06 LAB — COMPREHENSIVE METABOLIC PANEL
ALT: 230 U/L — ABNORMAL HIGH (ref 0–44)
AST: 142 U/L — ABNORMAL HIGH (ref 15–41)
Albumin: 3.7 g/dL (ref 3.5–5.0)
Alkaline Phosphatase: 80 U/L (ref 38–126)
Anion gap: 14 (ref 5–15)
BUN: 47 mg/dL — ABNORMAL HIGH (ref 8–23)
CO2: 17 mmol/L — ABNORMAL LOW (ref 22–32)
Calcium: 8.7 mg/dL — ABNORMAL LOW (ref 8.9–10.3)
Chloride: 98 mmol/L (ref 98–111)
Creatinine, Ser: 3.51 mg/dL — ABNORMAL HIGH (ref 0.44–1.00)
GFR, Estimated: 13 mL/min — ABNORMAL LOW (ref >60)
Glucose, Bld: 190 mg/dL — ABNORMAL HIGH (ref 70–99)
Potassium: 4.8 mmol/L (ref 3.5–5.1)
Sodium: 129 mmol/L — ABNORMAL LOW (ref 135–145)
Total Bilirubin: 1.6 mg/dL — ABNORMAL HIGH (ref <1.2)
Total Protein: 6.5 g/dL (ref 6.5–8.1)

## 2023-08-06 LAB — MAGNESIUM: Magnesium: 1 mg/dL — ABNORMAL LOW (ref 1.7–2.4)

## 2023-08-06 LAB — URINALYSIS, W/ REFLEX TO CULTURE (INFECTION SUSPECTED)
Bilirubin Urine: NEGATIVE
Glucose, UA: 250 mg/dL — AB
Ketones, ur: NEGATIVE mg/dL
Nitrite: NEGATIVE
Protein, ur: 30 mg/dL — AB
Specific Gravity, Urine: 1.011 (ref 1.005–1.030)
pH: 5.5 (ref 5.0–8.0)

## 2023-08-06 LAB — GLUCOSE, CAPILLARY
Glucose-Capillary: 278 mg/dL — ABNORMAL HIGH (ref 70–99)
Glucose-Capillary: 62 mg/dL — ABNORMAL LOW (ref 70–99)
Glucose-Capillary: 121 mg/dL — ABNORMAL HIGH (ref 70–99)

## 2023-08-06 LAB — BASIC METABOLIC PANEL
Anion gap: 11 (ref 5–15)
BUN: 45 mg/dL — ABNORMAL HIGH (ref 8–23)
CO2: 19 mmol/L — ABNORMAL LOW (ref 22–32)
Calcium: 7.7 mg/dL — ABNORMAL LOW (ref 8.9–10.3)
Chloride: 105 mmol/L (ref 98–111)
Creatinine, Ser: 3.12 mg/dL — ABNORMAL HIGH (ref 0.44–1.00)
GFR, Estimated: 15 mL/min — ABNORMAL LOW (ref >60)
Glucose, Bld: 156 mg/dL — ABNORMAL HIGH (ref 70–99)
Potassium: 4.2 mmol/L (ref 3.5–5.1)
Sodium: 135 mmol/L (ref 135–145)

## 2023-08-06 LAB — POCT I-STAT 7, (LYTES, BLD GAS, ICA,H+H)
Acid-base deficit: 9 mmol/L — ABNORMAL HIGH (ref 0.0–2.0)
Bicarbonate: 13.3 mmol/L — ABNORMAL LOW (ref 20.0–28.0)
Calcium, Ion: 1.07 mmol/L — ABNORMAL LOW (ref 1.15–1.40)
HCT: 28 % — ABNORMAL LOW (ref 36.0–46.0)
Hemoglobin: 9.5 g/dL — ABNORMAL LOW (ref 12.0–15.0)
O2 Saturation: 90 %
Patient temperature: 104.6
Potassium: 3.6 mmol/L (ref 3.5–5.1)
Sodium: 133 mmol/L — ABNORMAL LOW (ref 135–145)
TCO2: 14 mmol/L — ABNORMAL LOW (ref 22–32)
pCO2 arterial: 22.7 mm[Hg] — ABNORMAL LOW (ref 32–48)
pH, Arterial: 7.389 (ref 7.35–7.45)
pO2, Arterial: 68 mm[Hg] — ABNORMAL LOW (ref 83–108)

## 2023-08-06 LAB — C DIFFICILE QUICK SCREEN W PCR REFLEX
C Diff antigen: NEGATIVE
C Diff interpretation: NOT DETECTED
C Diff toxin: NEGATIVE

## 2023-08-06 LAB — CBC WITH DIFFERENTIAL/PLATELET
Abs Immature Granulocytes: 1.49 10*3/uL — ABNORMAL HIGH (ref 0.00–0.07)
Basophils Absolute: 0.1 10*3/uL (ref 0.0–0.1)
Basophils Relative: 0 %
Eosinophils Absolute: 0 10*3/uL (ref 0.0–0.5)
Eosinophils Relative: 0 %
HCT: 36.1 % (ref 36.0–46.0)
Hemoglobin: 12.2 g/dL (ref 12.0–15.0)
Immature Granulocytes: 7 %
Lymphocytes Relative: 3 %
Lymphs Abs: 0.6 10*3/uL — ABNORMAL LOW (ref 0.7–4.0)
MCH: 29.8 pg (ref 26.0–34.0)
MCHC: 33.8 g/dL (ref 30.0–36.0)
MCV: 88.3 fL (ref 80.0–100.0)
Monocytes Absolute: 1.1 10*3/uL — ABNORMAL HIGH (ref 0.1–1.0)
Monocytes Relative: 5 %
Neutro Abs: 19.2 10*3/uL — ABNORMAL HIGH (ref 1.7–7.7)
Neutrophils Relative %: 85 %
Platelets: 91 10*3/uL — ABNORMAL LOW (ref 150–400)
RBC: 4.09 MIL/uL (ref 3.87–5.11)
RDW: 13.8 % (ref 11.5–15.5)
WBC Morphology: INCREASED
WBC: 22.4 10*3/uL — ABNORMAL HIGH (ref 4.0–10.5)
nRBC: 0 % (ref 0.0–0.2)

## 2023-08-06 LAB — LACTIC ACID, PLASMA
Lactic Acid, Venous: 4.6 mmol/L (ref 0.5–1.9)
Lactic Acid, Venous: 6.8 mmol/L (ref 0.5–1.9)
Lactic Acid, Venous: 3.7 mmol/L (ref 0.5–1.9)

## 2023-08-06 LAB — AMMONIA: Ammonia: <10 umol/L (ref 9–35)

## 2023-08-06 LAB — TSH: TSH: 3.718 u[IU]/mL (ref 0.350–4.500)

## 2023-08-06 LAB — RESP PANEL BY RT-PCR (RSV, FLU A&B, COVID)  RVPGX2
Influenza A by PCR: NEGATIVE
Influenza B by PCR: NEGATIVE
Resp Syncytial Virus by PCR: NEGATIVE
SARS Coronavirus 2 by RT PCR: NEGATIVE

## 2023-08-06 LAB — PROCALCITONIN: Procalcitonin: 58.91 ng/mL

## 2023-08-06 LAB — HEMOGLOBIN A1C
Hgb A1c MFr Bld: 5.9 % — ABNORMAL HIGH (ref 4.8–5.6)
Mean Plasma Glucose: 122.63 mg/dL

## 2023-08-06 LAB — ETHANOL: Alcohol, Ethyl (B): <10 mg/dL (ref <10)

## 2023-08-06 LAB — T4, FREE: Free T4: 1.3 ng/dL — ABNORMAL HIGH (ref 0.61–1.12)

## 2023-08-06 LAB — MRSA NEXT GEN BY PCR, NASAL: MRSA by PCR Next Gen: NOT DETECTED

## 2023-08-06 LAB — PROTIME-INR
INR: 1.4 — ABNORMAL HIGH (ref 0.8–1.2)
Prothrombin Time: 17.1 s — ABNORMAL HIGH (ref 11.4–15.2)

## 2023-08-06 LAB — APTT: aPTT: 33 s (ref 24–36)

## 2023-08-06 MED ORDER — PIPERACILLIN-TAZOBACTAM 3.375 G IVPB 30 MIN
3.3750 g | Freq: Once | INTRAVENOUS | Status: AC
  Administered 2023-08-06: 3.375 g via INTRAVENOUS
  Filled 2023-08-06: qty 50

## 2023-08-06 MED ORDER — ENOXAPARIN SODIUM 30 MG/0.3ML IJ SOSY
30.0000 mg | PREFILLED_SYRINGE | INTRAMUSCULAR | Status: DC
  Administered 2023-08-06 – 2023-08-07 (×2): 30 mg via SUBCUTANEOUS
  Filled 2023-08-06 (×2): qty 0.3

## 2023-08-06 MED ORDER — CHLORHEXIDINE GLUCONATE CLOTH 2 % EX PADS
6.0000 | MEDICATED_PAD | Freq: Every day | CUTANEOUS | Status: DC
  Administered 2023-08-06 – 2023-08-18 (×12): 6 via TOPICAL

## 2023-08-06 MED ORDER — PIPERACILLIN-TAZOBACTAM IN DEX 2-0.25 GM/50ML IV SOLN
2.2500 g | Freq: Three times a day (TID) | INTRAVENOUS | Status: DC
Start: 2023-08-06 — End: 2023-08-08
  Administered 2023-08-06 – 2023-08-08 (×5): 2.25 g via INTRAVENOUS
  Filled 2023-08-06 (×8): qty 50

## 2023-08-06 MED ORDER — ACETAMINOPHEN 325 MG PO TABS
650.0000 mg | ORAL_TABLET | Freq: Four times a day (QID) | ORAL | Status: DC | PRN
  Administered 2023-08-06 – 2023-08-19 (×10): 650 mg via ORAL
  Filled 2023-08-06 (×10): qty 2

## 2023-08-06 MED ORDER — LACTATED RINGERS IV BOLUS
1000.0000 mL | Freq: Once | INTRAVENOUS | Status: AC
  Administered 2023-08-06: 1000 mL via INTRAVENOUS

## 2023-08-06 MED ORDER — DEXTROSE 50 % IV SOLN: 12.5000 g | INTRAVENOUS | Status: AC

## 2023-08-06 MED ORDER — MAGNESIUM SULFATE 50 % IJ SOLN
6.0000 g | Freq: Once | INTRAVENOUS | Status: AC
  Administered 2023-08-07: 6 g via INTRAVENOUS
  Filled 2023-08-06: qty 12

## 2023-08-06 MED ORDER — LACTATED RINGERS IV BOLUS
250.0000 mL | Freq: Once | INTRAVENOUS | Status: AC
  Administered 2023-08-06: 250 mL via INTRAVENOUS

## 2023-08-06 MED ORDER — SODIUM CHLORIDE 0.9 % IV SOLN: 10.0000 mg | INTRAVENOUS | Status: DC

## 2023-08-06 MED ORDER — FAMOTIDINE 20 MG PO TABS
20.0000 mg | ORAL_TABLET | Freq: Every day | ORAL | Status: DC
  Administered 2023-08-06: 20 mg via ORAL
  Filled 2023-08-06 (×2): qty 1

## 2023-08-06 MED ORDER — FAMOTIDINE IN NACL 20-0.9 MG/50ML-% IV SOLN
20.0000 mg | INTRAVENOUS | Status: DC
  Filled 2023-08-06: qty 50

## 2023-08-06 MED ORDER — MAGNESIUM SULFATE 4 GM/100ML IV SOLN: 4.0000 g | Freq: Once | INTRAVENOUS | Status: DC

## 2023-08-06 MED ORDER — ONDANSETRON HCL 4 MG/2ML IJ SOLN
4.0000 mg | Freq: Once | INTRAMUSCULAR | Status: DC
  Filled 2023-08-06: qty 2

## 2023-08-06 MED ORDER — INSULIN ASPART 100 UNIT/ML IJ SOLN: 1.0000 [IU] | INTRAMUSCULAR | Status: DC

## 2023-08-06 MED ORDER — CALCIUM GLUCONATE-NACL 2-0.675 GM/100ML-% IV SOLN
2.0000 g | Freq: Once | INTRAVENOUS | Status: AC
Start: 2023-08-07 — End: 2023-08-07
  Administered 2023-08-06: 2000 mg via INTRAVENOUS
  Filled 2023-08-06: qty 100

## 2023-08-06 MED ORDER — ONDANSETRON HCL 4 MG/2ML IJ SOLN
4.0000 mg | Freq: Once | INTRAMUSCULAR | Status: AC
  Administered 2023-08-06: 4 mg via INTRAVENOUS
  Filled 2023-08-06: qty 2

## 2023-08-06 MED ORDER — SODIUM BICARBONATE 8.4 % IV SOLN: 100.0000 meq | Freq: Once | INTRAVENOUS | Status: DC

## 2023-08-06 MED ORDER — KCL-LACTATED RINGERS-D5W 20 MEQ/L IV SOLN
INTRAVENOUS | Status: DC
  Filled 2023-08-06: qty 1000

## 2023-08-06 MED ORDER — DEXTROSE 50 % IV SOLN
INTRAVENOUS | Status: AC
  Administered 2023-08-06: 12.5 g via INTRAVENOUS
  Filled 2023-08-06: qty 50

## 2023-08-06 MED ORDER — CLINDAMYCIN PHOSPHATE 300 MG/50ML IV SOLN: 300.0000 mg | Freq: Once | INTRAVENOUS | Status: DC

## 2023-08-06 MED ORDER — LACTATED RINGERS IV BOLUS (SEPSIS)
1000.0000 mL | Freq: Once | INTRAVENOUS | Status: AC
  Administered 2023-08-06: 1000 mL via INTRAVENOUS

## 2023-08-06 MED ORDER — LACTATED RINGERS IV SOLN: INTRAVENOUS | Status: AC

## 2023-08-06 NOTE — ED Provider Notes (Signed)
[place] EMERGENCY DEPARTMENT AT [facility] Provider Note   CSN: [id number] Arrival date & time: 08/06/23  1243     History  Chief Complaint  Patient presents with   Weakness    Tina Briggs is a 77 y.o. female.  Patient is a 77 year old female with past medical history of hypertension, hyperlipidemia, diabetes, anxiety, depression, IBS presenting for complaints of diarrhea.  Patient admits to feelings of unwellness, fatigue, and diarrhea x 3 days.  States she normally takes Colestipol for intermittent diarrhea but states her stools normally formed just loose.  She admits to watery like diarrhea for the past 3 days, nausea, vomiting, and abdominal fullness.  She denies any recent antibiotic use, close contact with person with C. difficile, or hospital/nursing home visits.  She denies fevers or chills.  Blood pressure 93/49 on arrival.  The history is provided by the patient. No language interpreter was used.  Weakness Associated symptoms: diarrhea, nausea and vomiting   Associated symptoms: no abdominal pain, no arthralgias, no chest pain, no cough, no dysuria, no fever, no seizures and no shortness of breath        Home Medications Prior to Admission medications   Medication Sig Start Date End Date Taking? Authorizing Provider  acetaminophen (TYLENOL) 500 MG tablet Take 1,000 mg by mouth 2 (two) times daily as needed for moderate pain or headache.    Yes [provider]  cholecalciferol (VITAMIN D) 1000 units tablet Take 5,000 Units by mouth daily.   Yes [provider]  colestipol (COLESTID) 1 g tablet Take 2 tablets (2 g total) by mouth in the morning and at bedtime. Patient taking differently: Take 2 g by mouth in the morning and at bedtime. *usually takes 1 qam and 2 qpm* 08/21/22  Yes Celso Amy, PA-C  CRESTOR 40 MG tablet TAKE 1 TABLET AT BEDTIME Patient taking differently: Take 40 mg by mouth at bedtime. 04/19/13  Yes Wall, Jesse Sans,  MD  dapagliflozin propanediol (FARXIGA) 5 MG TABS tablet Take 5 mg by mouth daily.   Yes [provider]  desloratadine (CLARINEX) 5 MG tablet Take 1 tablet (5 mg total) by mouth daily. 11/21/22  Yes [name], MD  diclofenac Sodium (VOLTAREN) 1 % GEL Apply 4 g topically 4 (four) times daily. 11/03/21  Yes Melene Plan, DO  fluticasone (FLONASE) 50 MCG/ACT nasal spray Place 2 sprays into both nostrils daily as needed for allergies.    Yes [provider]  gabapentin (NEURONTIN) 300 MG capsule Take 300-600 mg by mouth See admin instructions. 300mg  in the morning 600mg  at night 10/26/18  Yes [provider]  losartan (COZAAR) 50 MG tablet Take 1 tablet (50 mg total) by mouth 2 (two) times daily. 05/09/23  Yes   Multiple Vitamins-Minerals (PRESERVISION AREDS 2 PO) Take 1 tablet by mouth 2 (two) times daily.   Yes [provider]  NOVOLOG FLEXPEN RELION 100 UNIT/ML FlexPen Inject 8-10 Units into the skin 3 (three) times daily as needed. BS 12/05/20  Yes [provider]  rosuvastatin (CRESTOR) 40 MG tablet Take 1 tablet (40 mg total) by mouth at bedtime. 08/15/22  Yes [name], MD  Semaglutide,0.25 or 0.5MG /DOS, (OZEMPIC, 0.25 OR 0.5 MG/DOSE,) 2 MG/3ML SOPN Inject 0.5 mg into the skin once a week. 04/01/23  Yes   venlafaxine XR (EFFEXOR-XR) 75 MG 24 hr capsule Take 75 mg by mouth every morning.    Yes [provider]  vitamin B-12 (CYANOCOBALAMIN) 1000 MCG tablet Take  1,000 mcg by mouth 3 (three) times a week. Take on Sundays, Wednesdays and Fridays.   Yes [provider]  cephALEXin (KEFLEX) 500 MG capsule Take 1 capsule (500 mg total) by mouth 3 (three) times daily. Patient not taking: Reported on 08/06/2023 05/14/23   Lenn Sink, DPM  FREESTYLE LITE test strip USE 1 STRIP TO CHECK GLUCOSE 4 TIMES DAILY 12/04/20   [provider]  methocarbamol (ROBAXIN) 500 MG tablet Take 1 tablet (500 mg total) by mouth 2 (two) times  daily. Patient not taking: Reported on 08/06/2023 11/03/21   Melene Plan, DO  methylPREDNISolone (MEDROL DOSEPAK) 4 MG TBPK tablet Day 1: 8mg  before breakfast, 4 mg after lunch, 4 mg after supper, and 8 mg at bedtime Day 2: 4 mg before breakfast, 4 mg after lunch, 4 mg  after supper, and 8 mg  at bedtime Day 3:  4 mg  before breakfast, 4 mg  after lunch, 4 mg after supper, and 4 mg  at bedtime Day 4: 4 mg  before breakfast, 4 mg  after lunch, and 4 mg at bedtime Day 5: 4 mg  before breakfast and 4 mg at bedtime Day 6: 4 mg  before breakfast Patient not taking: Reported on 08/06/2023 11/03/21   Melene Plan, DO  metoprolol tartrate (LOPRESSOR) 100 MG tablet Take one tablet by mouth 2 hours prior to your CT Patient not taking: Reported on 08/06/2023 05/08/21   Lyn Records, MD      Allergies    Codeine, Clavulanic acid, Flagyl [metronidazole], Other, Prevacid [lansoprazole], Tetracyclines & related, Trulicity [dulaglutide], Azithromycin, Erythromycin, Morphine and codeine, and Septra [sulfamethoxazole-trimethoprim]    Review of Systems   Review of Systems  Constitutional:  Negative for chills and fever.  HENT:  Negative for ear pain and sore throat.   Eyes:  Negative for pain and visual disturbance.  Respiratory:  Negative for cough and shortness of breath.   Cardiovascular:  Negative for chest pain and palpitations.  Gastrointestinal:  Positive for diarrhea, nausea and vomiting. Negative for abdominal pain.  Genitourinary:  Negative for dysuria and hematuria.  Musculoskeletal:  Negative for arthralgias and back pain.  Skin:  Negative for color change and rash.  Neurological:  Positive for weakness. Negative for seizures and syncope.  All other systems reviewed and are negative.   Physical Exam Updated Vital Signs BP (!) 134/105   Pulse 87   Temp 97.7 F (36.5 C) (Oral)   Resp (!) 27   SpO2 96%  Physical Exam Vitals and nursing note reviewed.  Constitutional:      General: She is not in  acute distress.    Appearance: She is well-developed.  HENT:     Head: Normocephalic and atraumatic.  Eyes:     Conjunctiva/sclera: Conjunctivae normal.  Cardiovascular:     Rate and Rhythm: Normal rate and regular rhythm.     Heart sounds: No murmur heard. Pulmonary:     Effort: Pulmonary effort is normal. No respiratory distress.     Breath sounds: Normal breath sounds.  Abdominal:     Palpations: Abdomen is soft.     Tenderness: There is abdominal tenderness in the left lower quadrant. There is no guarding or rebound.  Musculoskeletal:        General: No swelling.     Cervical back: Neck supple.  Skin:    General: Skin is warm and dry.     Capillary Refill: Capillary refill takes less than 2 seconds.  Neurological:  Mental Status: She is alert.  Psychiatric:        Mood and Affect: Mood normal.     ED Results / Procedures / Treatments   Labs (all labs ordered are listed, but only abnormal results are displayed) Labs Reviewed  LACTIC ACID, PLASMA - Abnormal; Notable for the following components:      Result Value   Lactic Acid, Venous 4.6 (*)    All other components within normal limits  COMPREHENSIVE METABOLIC PANEL - Abnormal; Notable for the following components:   Sodium 129 (*)    CO2 17 (*)    Glucose, Bld 190 (*)    BUN 47 (*)    Creatinine, Ser 3.51 (*)    Calcium 8.7 (*)    AST 142 (*)    ALT 230 (*)    Total Bilirubin 1.6 (*)    GFR, Estimated 13 (*)    All other components within normal limits  CBC WITH DIFFERENTIAL/PLATELET - Abnormal; Notable for the following components:   WBC 22.4 (*)    Platelets 91 (*)    Neutro Abs 19.2 (*)    Lymphs Abs 0.6 (*)    Monocytes Absolute 1.1 (*)    Abs Immature Granulocytes 1.49 (*)    All other components within normal limits  PROTIME-INR - Abnormal; Notable for the following components:   Prothrombin Time 17.1 (*)    INR 1.4 (*)    All other components within normal limits  RESP PANEL BY RT-PCR (RSV,  FLU A&B, COVID)  [id number]  CULTURE, BLOOD (ROUTINE X 2)  CULTURE, BLOOD (ROUTINE X 2)  C DIFFICILE QUICK SCREEN W PCR REFLEX    APTT  LACTIC ACID, PLASMA  URINALYSIS, W/ REFLEX TO CULTURE (INFECTION SUSPECTED)    EKG None  Radiology No results found.  Procedures .Critical Care  Performed by: Franne Forts, DO Authorized by: Franne Forts, DO   Critical care provider statement:    Critical care time (minutes):  101   Critical care was necessary to treat or prevent imminent or life-threatening deterioration of the following conditions:  Sepsis   Critical care was time spent personally by me on the following activities:  Development of treatment plan with patient or surrogate, discussions with consultants, evaluation of patient's response to treatment, examination of patient, ordering and review of laboratory studies, ordering and review of radiographic studies, ordering and performing treatments and interventions, pulse oximetry, re-evaluation of patient's condition and review of old charts     Medications Ordered in ED Medications  lactated ringers infusion ( Intravenous New Bag/Given 08/06/23 1444)  ondansetron (ZOFRAN) injection 4 mg (has no administration in time range)  lactated ringers bolus 250 mL (has no administration in time range)  piperacillin-tazobactam (ZOSYN) IVPB 3.375 g (has no administration in time range)  lactated ringers bolus 1,000 mL (0 mLs Intravenous Stopped 08/06/23 1441)  lactated ringers bolus 1,000 mL (1,000 mLs Intravenous New Bag/Given 08/06/23 1450)    ED Course/ Medical Decision Making/ A&P                                 Medical Decision Making Amount and/or Complexity of Data Reviewed Labs: ordered. Radiology: ordered. ECG/medicine tests: ordered.  Risk Prescription drug management. Decision regarding hospitalization.   76 year old female with past medical history of hypertension, hyperlipidemia, diabetes, anxiety, depression, IBS  presenting for complaints of diarrhea.  Patient is alert and oriented x 3,  with hypotension at 93/49 with otherwise stable vital signs..  Physical exam demonstrates soft abdomen without guarding or distention.  Tenderness palpation of the left lower quadrant.  Concerns for sepsis from infectious etiology including colitis/diverticulitis/C. difficile versus hypovolemia.  1 L IV fluids and maintenance fluids started.  Gwyndolyn Kaufman studies demonstrate leukocytosis at 22 and lactic acidosis of 4.6.  The rest of the 30 cc/kg fluid bolus has been ordered to make a total of 2300 L.  Concerns for possible C. difficile.  Will treat with Zosyn due to Flagyl allergy at this time with recommendations to obviously discontinue if C. difficile positive.  Stool sample has already been sent.  Reevaluation of patient demonstrates improvement after initial IV fluid bolus.  Blood pressure currently 134/105 after 1 L fluids.  Patient signed out to oncoming provider Dr. Rubin Payor all awaiting CT abdomen and pelvis and C. difficile PCR results.        Final Clinical Impression(s) / ED Diagnoses Final diagnoses:  Diarrhea, unspecified type  Sepsis, due to unspecified organism, unspecified whether acute organ dysfunction present [facility])    Rx / DC Orders ED Discharge Orders     None         Franne Forts, DO 08/06/23 1530

## 2023-08-06 NOTE — Consult Note (Signed)
NEUROLOGY CONSULT NOTE   Date of service: August 06, 2023 Patient Name: Tina Briggs MRN:  [id number] DOB:  Jun 07, 1946 Chief Complaint: "confusion and concern for L facial droop" Requesting Provider: Patrici Ranks, MD  History of Present Illness  Tina Briggs is a 77 y.o. female with hx of HTN, HLD, retinopathy, macular degeneration, GERD who is admitted with confusion x 3 days along with diarrhea and hypotension. CT A/P concerning for diverticulitis. Continued to be hypotensive despite fluids and elevated lactate and transferred to the ICU.  There was concern that she has aphasia out of proportion to encephalopathy as at times, she is not making sense with word salad. LKW was reported to me as 1945 when she was at [facility] based on the fact that the team noted a L facial droop which was not documented when she was seen at [facility] ED. I fail to appreciate a L facial droop and facial grimace to pain is symmetric.  LKW: 1945 but i doubt this is accurate. Modified rankin score: unclear. IV Thrombolysis: not offered, no stroke on MRI brain. EVT: not offered 2/2 no stroke.  NIHSS components Score: Comment  1a Level of Conscious 0[]  1[x]  2[]  3[]      1b LOC Questions 0[]  1[]  2[x]       1c LOC Commands 0[]  1[x]  2[]       2 Best Gaze 0[x]  1[]  2[]       3 Visual 0[x]  1[]  2[]  3[]      4 Facial Palsy 0[x]  1[]  2[]  3[]      5a Motor Arm - left 0[]  1[x]  2[]  3[]  4[]  UN[]    5b Motor Arm - Right 0[x]  1[]  2[]  3[]  4[]  UN[]    6a Motor Leg - Left 0[]  1[]  2[x]  3[]  4[]  UN[]    6b Motor Leg - Right 0[]  1[]  2[x]  3[]  4[]  UN[]    7 Limb Ataxia 0[x]  1[]  2[]  3[]  UN[]     8 Sensory 0[x]  1[]  2[]  UN[]      9 Best Language 0[]  1[]  2[x]  3[]      10 Dysarthria 0[x]  1[]  2[]  UN[]      11 Extinct. and Inattention 0[x]  1[]  2[]       TOTAL: 11       ROS  Unable to ascertain due to encephalopathy.  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 Mild to mod TI scarring. (Dr. Eliott Nine)   HNP (herniated nucleus pulposus), lumbar    Recurrent   HYPERLIPIDEMIA-MIXED    HYPERTENSION, UNSPECIFIED    Hypertensive retinopathy    Injury of right hip    Lumbar back pain    Macular degeneration    Microscopic hematuria    Neuropathy    OBESITY    Plantar fasciitis    PONV (postoperative nausea and vomiting)    past history only, "sometimes they put a patch on me"   Right eye injury     Past Surgical History:  Procedure Laterality Date   ABDOMINAL HYSTERECTOMY  APPENDECTOMY     BACK SURGERY     lumbar '09"microdiscectomy"   BREAST BIOPSY     BREAST BIOPSY Right 05/19/2023   MM RT BREAST BX W LOC DEV EA AD LESION IMG BX SPEC STEREO GUIDE 05/19/2023 [facility]   BREAST BIOPSY Right 05/19/2023   MM RT BREAST BX W LOC DEV 1ST LESION IMAGE BX SPEC STEREO GUIDE 05/19/2023 [facility]   breat Bx benign     CHOLECYSTECTOMY     open   COLONOSCOPY WITH PROPOFOL N/A 11/22/2014   Procedure: COLONOSCOPY WITH PROPOFOL;  Surgeon: Charolett Bumpers, MD;  Location: [facility];  Service: Endoscopy;  Laterality: N/A;   ERCP N/A 05/26/2015   Procedure: ENDOSCOPIC RETROGRADE CHOLANGIOPANCREATOGRAPHY (ERCP)   (DOING CASE IN MAIN OR);  Surgeon: Dorena Cookey, MD;  Location: [facility];  Service: Gastroenterology;  Laterality: N/A;   ERCP N/A 06/06/2015   Procedure: ENDOSCOPIC RETROGRADE CHOLANGIOPANCREATOGRAPHY (ERCP);  Surgeon: Vida Rigger, MD;  Location: [facility];  Service: Endoscopy;  Laterality: N/A;   ERCP N/A 01/12/2021   Procedure: ENDOSCOPIC RETROGRADE  CHOLANGIOPANCREATOGRAPHY (ERCP);  Surgeon: Vida Rigger, MD;  Location: [facility];  Service: Endoscopy;  Laterality: N/A;   ESOPHAGOGASTRODUODENOSCOPY (EGD) WITH PROPOFOL N/A 11/22/2014   Procedure: ESOPHAGOGASTRODUODENOSCOPY (EGD) WITH PROPOFOL;  Surgeon: Charolett Bumpers, MD;  Location: [facility];  Service: Endoscopy;  Laterality: N/A;   EYE SURGERY Bilateral    cataracts   GANGLION CYST EXCISION Right    LIPOMA EXCISION  1993   From thigh 1993   LUMBAR FUSION  09/16/2018   LUMBAR LAMINECTOMY/DECOMPRESSION MICRODISCECTOMY Left 12/31/2017   Procedure: Laminectomy and Foraminotomy - Lumbar Three-Four - left Extraforaminal diskectomy Lumbar Four-Five left;  Surgeon: Tia Alert, MD;  Location: [facility] OR;  Service: Neurosurgery;  Laterality: Left;   PERONEAL NERVE DECOMPRESSION Left 12/31/2017   Procedure: Left PERONEAL NERVE DECOMPRESSION;  Surgeon: Tia Alert, MD;  Location: [facility] OR;  Service: Neurosurgery;  Laterality: Left;  left   PERONEAL NERVE DECOMPRESSION     REMOVAL OF STONES  01/12/2021   Procedure: REMOVAL OF STONES;  Surgeon: Vida Rigger, MD;  Location: [facility];  Service: Endoscopy;;   Right ganglion cyst     Right ganglion cyst remove     TUBAL LIGATION      Family History: Family History  Problem Relation Age of Onset   Cancer Maternal Aunt        breast cancer   Cancer Maternal Grandmother        kidney cancer    Cancer Paternal Grandmother        bone cancer    Heart disease Mother    Alzheimer's disease Father    Tremor Father        possible PD   Diabetes Mellitus II Brother    Healthy Son    Healthy Son     Social History  reports that she has never smoked. She has never used smokeless tobacco. She reports that she does not drink alcohol and does not use drugs.  Allergies  Allergen Reactions   Codeine Shortness Of Breath   Clavulanic Acid    Flagyl [Metronidazole]    Other Other (See Comments)   Prevacid [Lansoprazole]    Tetracyclines &  Related Itching   Trulicity [Dulaglutide]    Azithromycin Itching and Rash   Erythromycin Itching and Rash   Morphine And Codeine Itching and Rash   Septra [Sulfamethoxazole-Trimethoprim] Itching and Rash    Medications   Current Facility-Administered  Medications:    Chlorhexidine Gluconate Cloth 2 % PADS 6 each, 6 each, Topical, Daily, Vassie Loll, Rakesh V, MD   dextrose 5% in lactated ringers with KCl 20 mEq/L infusion, , Intravenous, Continuous, Albustami, Flonnie Hailstone, MD   dextrose 50 % solution 12.5 g, 12.5 g, Intravenous, STAT, Alva, Rakesh V, MD   dextrose 50 % solution, , , ,    enoxaparin (LOVENOX) injection 30 mg, 30 mg, Subcutaneous, Q24H, Albustami, Flonnie Hailstone, MD   famotidine (PEPCID) 10 mg in sodium chloride 0.9 % 25 mL, 10 mg, Intravenous, Q24H, Albustami, Flonnie Hailstone, MD   insulin aspart (novoLOG) injection 1-3 Units, 1-3 Units, Subcutaneous, Q4H, Albustami, Flonnie Hailstone, MD   lactated ringers infusion, , Intravenous, Continuous, Franne Forts, DO, Last Rate: 150 mL/hr at August 11, 2023 1444, New Bag at 2023-08-11 1444   [START ON 08/07/2023] piperacillin-tazobactam (ZOSYN) IVPB 2.25 g, 2.25 g, Intravenous, Q6H, Albustami, Flonnie Hailstone, MD   sodium bicarbonate injection 100 mEq, 100 mEq, Intravenous, Once, Patrici Ranks, MD  Vitals   Vitals:   2023/08/11 1604 Aug 11, 2023 1700 08/11/2023 1730 08-11-23 1953  BP: 115/80 (!) 118/48 (!) 134/56   Pulse: (!) 29 (!) 105    Resp: (!) 35 (!) 33 (!) 29   Temp:    (!) 104.6 F (40.3 C)  TempSrc:    Oral  SpO2: 94% 94% 94%     There is no height or weight on file to calculate BMI.  Physical Exam   Constitutional: Appears well-developed and well-nourished.  Eyes: No scleral injection.  HENT: No OP obstruction.  Head: Normocephalic.  Cardiovascular: Normal rate and regular rhythm.  Respiratory: Effort normal, non-labored breathing.  GI: Soft.  No distension. There is no tenderness.  Skin: WDI.   Neurologic Examination  Mental status/Cognition: encephalopathic  but opens eyes to loud voice and makes brief eye contact, oriented to self, poor attention and require constant stimulation to keep her awake enough. Speech/language: non- fluent, makes sense when speaking in short phraes or 1 word, with longer and open ended questions, speech is word salad. Comprehension intact to few simple commands and improved comprehension with visual cues. Cranial nerves:   CN II Pupils equal and reactive to light, unable to assess for VF deficit   CN III,IV,VI Makes eye contact on left and right   CN V normal sensation in V1, V2, and V3 segments bilaterally   CN VII no asymmetry, no nasolabial fold flattening   CN VIII normal hearing to speech   CN IX & X normal palatal elevation, no uvular deviation   CN XI Head midline.   CN XII midline tongue   Motor:  Muscle bulk: poor, tone normal Moves all extremities spontaneously and antigravity atleast. Prefers to use R hand over left but will use left hand when requested. BL upper extremities drift down when held up off the bed. BL lower extremities lifts off the bed but fall to the bed within a couple secs.  Sensation:  Light touch Intact throughout   Pin prick    Temperature    Vibration   Proprioception    Coordination/Complex Motor:  - Finger to Nose intact BL - Heel to shin unable to get her to do - Rapid alternating movement are slowed - Gait: deferred for patient safety.   Labs/Imaging/Neurodiagnostic studies   CBC:  Recent Labs  Lab 11-Aug-2023 1310 2023-08-11 2012  WBC 22.4*  --   NEUTROABS 19.2*  --   HGB 12.2 9.5*  HCT 36.1  28.0*  MCV 88.3  --   PLT 91*  --     Basic Metabolic Panel:  Lab Results  Component Value Date   NA 133 (L) 08/06/2023   K 3.6 08/06/2023   CO2 17 (L) 08/06/2023   GLUCOSE 190 (H) 08/06/2023   BUN 47 (H) 08/06/2023   CREATININE 3.51 (H) 08/06/2023   CALCIUM 8.7 (L) 08/06/2023   GFRNONAA 13 (L) 08/06/2023   GFRAA >60 10/19/2018    Lipid Panel: No results found  for: "LDLCALC"  HgbA1c:  Lab Results  Component Value Date   HGBA1C 6.7 (H) 01/13/2021    Urine Drug Screen: No results found for: "LABOPIA", "COCAINSCRNUR", "LABBENZ", "AMPHETMU", "THCU", "LABBARB"   Alcohol Level No results found for: "ETH"  INR  Lab Results  Component Value Date   INR 1.4 (H) 08/06/2023    APTT  Lab Results  Component Value Date   APTT 33 08/06/2023    AED levels: No results found for: "PHENYTOIN", "ZONISAMIDE", "LAMOTRIGINE", "LEVETIRACETA"    CT Head without contrast(Personally reviewed): CTH was negative for a large hypodensity concerning for a large territory infarct or hyperdensity concerning for an ICH  MRI Brain(Personally reviewed): No acute stroke on my review.  Impression   Tina Briggs is a 77 y.o. female  with hx of HTN, HLD, retinopathy, macular degeneration, GERD who is admitted with confusion x 3 days along with diarrhea and hypotension. CT A/P concerning for diverticulitis. Continued to be hypotensive despite fluids and elevated lactate and transferred to the ICU.  A code stroke was activated for concern for aphasia and L facial droop. On my evaluation, she is non focal but slight concern for potential aphasia out of proportion to encephalopathy. Still highest suspicion for toxidrome or sepsis causing her encephalopathy.  We obtained MRI Brain which was negative for an acute stroke.  I suspect sepsis with hypotension, fever likely explains her noted encephalopathy and in the absence of any significant findings on MRI Brain, would recommend treating underlying sepsis and if there is persistent confusion despite treatment of sepsis, can re-engage neurology.  Recommendations  - MRI Brain negative for stroke. Likely etiology of symptoms is sepsis. Would recommend treating underlying sepsis and if there is persistent confusion despite treatment of sepsis, can re-engage  neurology. ______________________________________________________________________  This patient is critically ill and at significant risk of neurological worsening, death and care requires constant monitoring of vital signs, hemodynamics,respiratory and cardiac monitoring, neurological assessment, discussion with family, other specialists and medical decision making of high complexity. I spent 60 minutes of neurocritical care time  in the care of  this patient. This was time spent independent of any time provided by nurse practitioner or PA.  Erick Blinks [facility] 08/06/2023  9:29 PM   Signed,  Erick Blinks [facility]

## 2023-08-06 NOTE — H&P (Addendum)
NAME:  Tina Briggs, MRN:  [id number], DOB:  April 03, 1946, LOS: 0 ADMISSION DATE:  08/06/2023, CONSULTATION DATE:  08/06/23 REFERRING MD:  [facility] CHIEF COMPLAINT:  Hypotension   History of Present Illness:  [name] is a 77 y.o. female who has a PMH as below including but not limited to HTN, HLD, DM, IBS, anxiety, depression. He presented to [facility] 11/6 with diarrhea, nausea, confusion x 3 days. Was apparently hypotensive since the day earlier. Diarrhea was water like, non bloody. No recent abx use or exposure to known persons with C.diff. BP soft initially but did respond to fluids; however, lactate climbed. CT abd/pelv was done and suggestive of sigmoid diverticulitis. He was started on Zosyn with continued IVF's.  Due to climbing lactate, PCCM called for transfer to [facility] ICU.  Pertinent  Medical History:  has Obesity; CATARACT, RIGHT EYE; Essential hypertension; GERD; Hypercholesterolemia; Elevated liver enzymes; RUQ abdominal pain; Type 2 diabetes mellitus with obesity (HCC); Aspiration pneumonitis (HCC); Anemia; Chronic kidney disease, stage 3 (HCC); Choledocholithiasis; Elevated serum immunoglobulin free light chain level; S/P lumbar laminectomy; S/P lumbar fusion; Diverticulitis; Glomerulonephritis; Prolapsed lumbar disc; Acute embolism and thrombosis of unspecified deep veins of left lower extremity (HCC); Allergic rhinitis; Anxiety; Benign neoplasm of stomach; Cholangiectasis; Cholelithiasis without obstruction; Complete fecal incontinence; Diabetic renal disease (HCC); Diverticulitis of colon; Edema of lower extremity; Essential tremor; Fatty liver; Foot-drop; Functional diarrhea; Hardening of the aorta (main artery of the heart) (HCC); History of diverticulitis; History of iron deficiency; Hypomagnesemia; Inflammation of sacroiliac joint (HCC); Iron deficiency anemia; Left lower quadrant pain; Long term (current) use of insulin (HCC); Low back pain; Lumbar radiculopathy; Major  depression in complete remission (HCC); Membranous glomerulonephritis; Microscopic hematuria; 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   Significant Hospital Events: Including procedures, antibiotic start and stop dates in addition to other pertinent events   11/6 admit.  Interim History / Subjective:  On arrival patient lethargic and left sided facial droop. Weak on LUE. Fever 104 F Hypoglycemic 60  Objective:  Blood pressure (!) 134/56, pulse (!) 105, temperature 97.7 F (36.5 C), resp. rate (!) 29, SpO2 94%.        Intake/Output Summary (Last 24 hours) at 08/06/2023 1851 Last data filed at 08/06/2023 1622 Gross per 24 hour  Intake 1250 ml  Output --  Net 1250 ml   There were no vitals filed for this visit.  Examination: General:  ill appearing female altered and shivering HEENT: MM pink/dry Neuro: Aox3; MAE CV: s1s2, RRR, no m/r/g PULM:  dim clear BS bilaterally; RA sats 93% GI: soft, bsx4 active  Extremities: warm/dry, no edema  Skin: no rashes or lesions appreciated   Labs/imaging personally reviewed:  CT Abd/Pelv 11/6 > diverticulitis at the junction of descending colon and proximal sigmoid colon, thickened/hyperattenuated wall of left main hepatic duct (seen on recent MRI, seen by GI but no notes available for review).   Assessment & Plan:   Sepsis Hypotension - responded to  IVF's. Acute Uncomplicated Sigmoid diverticulitis - has chronic  recurrent diverticulitis.  Possible UTI - Continue Zosyn. - follow cultures. - Continue IVFs; push po diet - trend LA  Acute metabolic encephalopathy: concern for stroke w/ left sided facial droop and acute AMS on arrival from [facility] to [facility] ED; hypoglycemic 60 Plan: - code stroke activated - CT head - give amp of dextrose - limit sedating meds - check uds, ethanol, tsh, ammonia - treat sepsis as above  AKI - suspect hypovolemia. Hyponatremia - suspect hypovolemia. - Continue IVFs. - Follow BMP.  Diarrhea. - fluids as above - f/u cdiff panel  Hx HTN, HLD. - Hold PTA Losartan, Lopressor.  Hx DM. Hypoglycemia - Hold home meds - hold ssi w/ hypoglycemia - cbg monitoring  Transaminitis - possibly low flow state from hypovolemia; though, does have known biliary disease on recent MRI (See below). - Continue fluids. - Hold PTA Crestor, Acetaminophen. - Trend LFT's.  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 Dr. Ewing Schlein was the ordering physician, no notes are available in our system. - Can follow up with Dr. Ewing Schlein as outpatient, don't think has any urgent inpatient needs.  Hx Anxiety, Depression. - Hold PTA Venlafaxine, Gabapentin.  Best practice (evaluated daily):  Diet/type: clear liquids DVT prophylaxis: LMWH GI prophylaxis: N/A Lines: N/A Foley:  N/A Code Status:  full code Last date of multidisciplinary goals of care discussion: 11/6 updated spouse Ronnie over phoen  Labs   CBC: Recent Labs  Lab 08/06/23 1310  WBC 22.4*  NEUTROABS 19.2*  HGB 12.2  HCT 36.1  MCV 88.3  PLT 91*    Basic Metabolic Panel: Recent Labs  Lab 08/06/23 1310  NA 129*  K 4.8  CL 98  CO2 17*  GLUCOSE 190*  BUN 47*  CREATININE 3.51*  CALCIUM 8.7*   GFR: CrCl cannot be calculated (Unknown ideal weight.). Recent Labs  Lab 08/06/23 1310  08/06/23 1424  WBC 22.4*  --   LATICACIDVEN 4.6* 6.8*    Liver Function Tests: Recent Labs  Lab 08/06/23 1310  AST 142*  ALT 230*  ALKPHOS 80  BILITOT 1.6*  PROT 6.5  ALBUMIN 3.7   No results for input(s): "LIPASE", "AMYLASE" in the last 168 hours. No results for input(s): "AMMONIA" in the last 168 hours.  ABG No results found for: "PHART", "PCO2ART", "PO2ART", "HCO3", "TCO2", "ACIDBASEDEF", "O2SAT"   Coagulation Profile: Recent Labs  Lab 08/06/23 1310  INR 1.4*    Cardiac Enzymes: No results for input(s): "CKTOTAL", "CKMB", "CKMBINDEX", "TROPONINI" in the last 168 hours.  HbA1C: Hgb A1c MFr Bld  Date/Time Value Ref Range Status  01/13/2021 02:54 AM 6.7 (H) 4.8 - 5.6 % Final    Comment:    (NOTE)         Prediabetes: 5.7 - 6.4         Diabetes: >6.4         Glycemic control for adults with diabetes: <7.0   09/09/2018 01:02 PM 6.8 (H) 4.8 - 5.6 % Final    Comment:    (NOTE) Pre diabetes:          5.7%-6.4% Diabetes:              >6.4% Glycemic control for   <7.0% adults with diabetes     CBG: No results for input(s): "GLUCAP" in the last 168 hours.  Review of Systems:   Patient is encephalopathic; therefore, history has been obtained from chart review.    Tina Medical History:  She,  has a Tina medical history of Allergic rhinitis, Anemia, Anxiety, Aspiration pneumonia (HCC), CATARACT, RIGHT EYE, Colon polyps, Depression, Diverticulitis, DM, Dyslipidemia, Elevated alkaline phosphatase level, GERD, GERD (gastroesophageal reflux disease), Glomerulonephritis (11/18/2017), HNP (herniated nucleus pulposus), lumbar, HYPERLIPIDEMIA-MIXED, HYPERTENSION, UNSPECIFIED, Hypertensive retinopathy, Injury of right hip, Lumbar back pain, Macular degeneration, Microscopic hematuria, Neuropathy, OBESITY, Plantar fasciitis, PONV (postoperative nausea and vomiting), and Right eye injury.   Surgical History:   Tina Surgical History:  Procedure Laterality Date   ABDOMINAL  HYSTERECTOMY     APPENDECTOMY     BACK SURGERY     lumbar '09"microdiscectomy"   BREAST BIOPSY     BREAST BIOPSY Right 05/19/2023   MM RT BREAST BX W LOC DEV EA AD LESION IMG BX SPEC STEREO GUIDE 05/19/2023 [facility]   BREAST BIOPSY Right 05/19/2023   MM RT BREAST BX W LOC DEV 1ST LESION IMAGE BX SPEC STEREO GUIDE 05/19/2023 [facility]   breat Bx benign     CHOLECYSTECTOMY     open   COLONOSCOPY WITH PROPOFOL N/A 11/22/2014   Procedure: COLONOSCOPY WITH PROPOFOL;  Surgeon: Charolett Bumpers, MD;  Location: [facility];  Service: Endoscopy;  Laterality: N/A;   ERCP N/A 05/26/2015   Procedure: ENDOSCOPIC RETROGRADE CHOLANGIOPANCREATOGRAPHY (ERCP)   (DOING CASE IN MAIN OR);  Surgeon: Dorena Cookey, MD;  Location: [facility];  Service: Gastroenterology;  Laterality: N/A;   ERCP N/A 06/06/2015   Procedure: ENDOSCOPIC RETROGRADE CHOLANGIOPANCREATOGRAPHY (ERCP);  Surgeon: Vida Rigger, MD;  Location: [facility];  Service: Endoscopy;  Laterality: N/A;   ERCP N/A 01/12/2021   Procedure: ENDOSCOPIC RETROGRADE CHOLANGIOPANCREATOGRAPHY (ERCP);  Surgeon: Vida Rigger, MD;  Location: [facility];  Service: Endoscopy;  Laterality: N/A;   ESOPHAGOGASTRODUODENOSCOPY (EGD) WITH PROPOFOL N/A 11/22/2014   Procedure: ESOPHAGOGASTRODUODENOSCOPY (EGD) WITH PROPOFOL;  Surgeon: Charolett Bumpers, MD;  Location: [facility];  Service: Endoscopy;  Laterality: N/A;   EYE SURGERY Bilateral    cataracts   GANGLION CYST EXCISION Right    LIPOMA EXCISION  1993   From thigh 1993   LUMBAR FUSION  09/16/2018   LUMBAR LAMINECTOMY/DECOMPRESSION MICRODISCECTOMY Left 12/31/2017   Procedure: Laminectomy and Foraminotomy - Lumbar Three-Four - left Extraforaminal diskectomy Lumbar Four-Five left;  Surgeon: Tia Alert, MD;  Location: [facility] OR;  Service: Neurosurgery;  Laterality: Left;   PERONEAL NERVE DECOMPRESSION Left 12/31/2017   Procedure: Left PERONEAL NERVE DECOMPRESSION;  Surgeon: Tia Alert, MD;   Location: [facility] OR;  Service: Neurosurgery;  Laterality: Left;  left   PERONEAL NERVE DECOMPRESSION     REMOVAL OF STONES  01/12/2021   Procedure: REMOVAL OF STONES;  Surgeon: Vida Rigger, MD;  Location: [facility];  Service: Endoscopy;;   Right ganglion cyst     Right ganglion cyst remove     TUBAL LIGATION       Social History:   reports that she has never smoked. She has never used smokeless tobacco. She reports that she does not drink alcohol and does not use drugs.   Family History:  Her family history includes Alzheimer's disease in her father; Cancer in her maternal aunt, maternal grandmother, and paternal grandmother; Diabetes Mellitus II in her brother; Healthy in her son and son; Heart disease in her mother; Tremor in her father.   Allergies Allergies  Allergen Reactions   Codeine Shortness Of Breath   Clavulanic Acid    Flagyl [Metronidazole]    Other Other (See Comments)   Prevacid [Lansoprazole]    Tetracyclines & Related Itching  Trulicity [Dulaglutide]    Azithromycin Itching and Rash   Erythromycin Itching and Rash   Morphine And Codeine Itching and Rash   Septra [Sulfamethoxazole-Trimethoprim] Itching and Rash     Home Medications  Prior to Admission medications   Medication Sig Start Date End Date Taking? Authorizing Provider  acetaminophen (TYLENOL) 500 MG tablet Take 1,000 mg by mouth 2 (two) times daily as needed for moderate pain or headache.    Yes [provider]  cholecalciferol (VITAMIN D) 1000 units tablet Take 5,000 Units by mouth daily.   Yes [provider]  colestipol (COLESTID) 1 g tablet Take 2 tablets (2 g total) by mouth in the morning and at bedtime. Patient taking differently: Take 2 g by mouth in the morning and at bedtime. *usually takes 1 qam and 2 qpm* 08/21/22  Yes Celso Amy, PA-C  CRESTOR 40 MG tablet TAKE 1 TABLET AT BEDTIME Patient taking differently: Take 40 mg by mouth at bedtime. 04/19/13  Yes Wall, Jesse Sans,  MD  dapagliflozin propanediol (FARXIGA) 5 MG TABS tablet Take 5 mg by mouth daily.   Yes [provider]  desloratadine (CLARINEX) 5 MG tablet Take 1 tablet (5 mg total) by mouth daily. 11/21/22  Yes [name], MD  diclofenac Sodium (VOLTAREN) 1 % GEL Apply 4 g topically 4 (four) times daily. 11/03/21  Yes Melene Plan, DO  fluticasone (FLONASE) 50 MCG/ACT nasal spray Place 2 sprays into both nostrils daily as needed for allergies.    Yes [provider]  gabapentin (NEURONTIN) 300 MG capsule Take 300-600 mg by mouth See admin instructions. 300mg  in the morning 600mg  at night 10/26/18  Yes [provider]  losartan (COZAAR) 50 MG tablet Take 1 tablet (50 mg total) by mouth 2 (two) times daily. 05/09/23  Yes   Multiple Vitamins-Minerals (PRESERVISION AREDS 2 PO) Take 1 tablet by mouth 2 (two) times daily.   Yes [provider]  NOVOLOG FLEXPEN RELION 100 UNIT/ML FlexPen Inject 8-10 Units into the skin 3 (three) times daily as needed. BS 12/05/20  Yes [provider]  rosuvastatin (CRESTOR) 40 MG tablet Take 1 tablet (40 mg total) by mouth at bedtime. 08/15/22  Yes [name], MD  Semaglutide,0.25 or 0.5MG /DOS, (OZEMPIC, 0.25 OR 0.5 MG/DOSE,) 2 MG/3ML SOPN Inject 0.5 mg into the skin once a week. 04/01/23  Yes   venlafaxine XR (EFFEXOR-XR) 75 MG 24 hr capsule Take 75 mg by mouth every morning.    Yes [provider]  vitamin B-12 (CYANOCOBALAMIN) 1000 MCG tablet Take 1,000 mcg by mouth 3 (three) times a week. Take on Sundays, Wednesdays and Fridays.   Yes [provider]  cephALEXin (KEFLEX) 500 MG capsule Take 1 capsule (500 mg total) by mouth 3 (three) times daily. Patient not taking: Reported on 08/06/2023 05/14/23   Lenn Sink, DPM  FREESTYLE LITE test strip USE 1 STRIP TO CHECK GLUCOSE 4 TIMES DAILY 12/04/20   [provider]  methocarbamol (ROBAXIN) 500 MG tablet Take 1 tablet (500 mg total) by mouth 2 (two) times  daily. Patient not taking: Reported on 08/06/2023 11/03/21   Melene Plan, DO  methylPREDNISolone (MEDROL DOSEPAK) 4 MG TBPK tablet Day 1: 8mg  before breakfast, 4 mg after lunch, 4 mg after supper, and 8 mg at bedtime Day 2: 4 mg before breakfast, 4 mg after lunch, 4 mg  after supper, and 8 mg  at bedtime Day 3:  4 mg  before breakfast, 4 mg  after lunch,  4 mg after supper, and 4 mg  at bedtime Day 4: 4 mg  before breakfast, 4 mg  after lunch, and 4 mg at bedtime Day 5: 4 mg  before breakfast and 4 mg at bedtime Day 6: 4 mg  before breakfast Patient not taking: Reported on 08/06/2023 11/03/21   Melene Plan, DO  metoprolol tartrate (LOPRESSOR) 100 MG tablet Take one tablet by mouth 2 hours prior to your CT Patient not taking: Reported on 08/06/2023 05/08/21   Lyn Records, MD     Critical care time: 45 minutes   JD Daryel [facility] 08/06/2023, 8:14 PM  Please see [email or web address] for pager details.  From 7A-7P if no response, please call [phone]. After hours, please call ELink [phone].

## 2023-08-06 NOTE — Progress Notes (Addendum)
eLink Physician-Brief Progress Note Patient Name: SMANTHA BOAKYE DOB: 04/07/1946 MRN: [id number]   Date of Service  08/06/2023  HPI/Events of Note  77 year old female with a history of metabolic syndrome, IBS, anxiety that initially presented with diarrhea, nausea, and confusion found to have sepsis secondary to acute sigmoid diverticulitis and possible UTI.  On presentation, the patient is febrile to 104.6, tachypneic, tachycardic, and hypotensive.  Saturating 94% on room air.  Laboratory studies consistent with moderate hyponatremia, elevated creatinine, mild none anion gap metabolic acidosis and some transaminitis.  Downtrending lactic acidosis with leukocytosis.  Urinalysis grossly positive cultures obtained.  eICU Interventions  Status post almost 3.5 L of crystalloid infusion.  Initiate norepinephrine if the patient has refractory hypertension.  Maintain D5 LR and LR infusion.  Agree with Zosyn  Enoxaparin for DVT prophylaxis GI prophylaxis with famotidine   2304 - Add Magnesium and Calcium supplementation  Intervention Category Evaluation Type: New Patient Evaluation  Kesley Mullens 08/06/2023, 8:46 PM

## 2023-08-06 NOTE — Progress Notes (Signed)
Confirmed with nurse need for further PIVs, no further needs at this time. Instructed nurse to consult IV team with further needs. VU. Tomasita Morrow, RN VAST

## 2023-08-06 NOTE — ED Notes (Signed)
 [facility] with cl called for transport

## 2023-08-06 NOTE — ED Provider Notes (Addendum)
  Physical Exam  BP (!) 118/48   Pulse (!) 105   Temp 97.7 F (36.5 C)   Resp (!) 33   SpO2 94%   Physical Exam  Procedures  Procedures  ED Course / MDM    Medical Decision Making Amount and/or Complexity of Data Reviewed Labs: ordered. Radiology: ordered. ECG/medicine tests: ordered.  Risk Prescription drug management. Decision regarding hospitalization.   Received in signout diarrhea with hypotension.  Acute kidney injury.  CT scan done without contrast that shows potential diverticulitis.  Has been given Zosyn.  Has had 30/kg of saline and blood pressures improved had gone up to 130 now at 110 again.  However lactic acid went from 4 up to 6.8.  With blood pressure improved it is more reassuring.  Benign abdominal exam and likely does have dehydration.  Another liter of fluid given.  However discussed with intensivist.  Discussed with Dr. Vassie Loll.  Blood pressure improved.  However his lactic acid up accepted the ICU.  Will repeat now and if his come down significantly potentially could go to a floor bed.  CRITICAL CARE Performed by: Benjiman Core Total critical care time: 30 minutes Critical care time was exclusive of separately billable procedures and treating other patients. Critical care was necessary to treat or prevent imminent or life-threatening deterioration. Critical care was time spent personally by me on the following activities: development of treatment plan with patient and/or surrogate as well as nursing, discussions with consultants, evaluation of patient's response to treatment, examination of patient, obtaining history from patient or surrogate, ordering and performing treatments and interventions, ordering and review of laboratory studies, ordering and review of radiographic studies, pulse oximetry and re-evaluation of patient's condition.   Repeat lactic acid just resulted and now down to 3.7.  Blood pressure has maintained.         Benjiman Core, MD 08/06/23 1734    Benjiman Core, MD 08/06/23 1907

## 2023-08-06 NOTE — Progress Notes (Signed)
ED Pharmacy Antibiotic Sign Off An antibiotic consult was received from an ED provider for zosyn per pharmacy dosing for intra-abdominal infection. A chart review was completed to assess appropriateness.   The following one time order(s) were placed:  Zosyn 3.375g IV x 1  Further antibiotic and/or antibiotic pharmacy consults should be ordered by the admitting provider if indicated.   Thank you for allowing pharmacy to be a part of this patient's care.   Daylene Posey, [facility]  Clinical Pharmacist 08/06/23 3:13 PM

## 2023-08-06 NOTE — ED Triage Notes (Signed)
Diarrhea, nausea low BP since Monday. "I just feel awful" Episodes of confusion last night.

## 2023-08-06 NOTE — Progress Notes (Signed)
Responded to Code Stroke called at 2005 for L sided facial droop, L arm weakness, and aphasia, [id number]. CBG-62>278 after 1/2 amp D50, NIH-9, CT head negative for acute changes. Pt taken to MRI at 2035. MRI-negative for stroke. Code Stroke cancelled a 2054.

## 2023-08-06 NOTE — Progress Notes (Addendum)
Elink following for sepsis protocol. 1430 Communicated w/ MD and confirmed antibiotics on hold until rule out c.diff

## 2023-08-06 NOTE — ED Notes (Signed)
CRITICAL VALUE STICKER  CRITICAL VALUE:Lactate 6.8  RECEIVER (on-site recipient of call):Carmon Ginsberg, RN  DATE & TIME NOTIFIED:   MESSENGER (representative from lab):  MD NOTIFIED: [place]  TIME OF NOTIFICATION:1646  RESPONSE:

## 2023-08-07 ENCOUNTER — Encounter (HOSPITAL_COMMUNITY): Payer: Self-pay | Admitting: Pulmonary Disease

## 2023-08-07 ENCOUNTER — Inpatient Hospital Stay (HOSPITAL_COMMUNITY): Payer: PPO

## 2023-08-07 DIAGNOSIS — N179 Acute kidney failure, unspecified: Secondary | ICD-10-CM | POA: Diagnosis not present

## 2023-08-07 DIAGNOSIS — I9589 Other hypotension: Secondary | ICD-10-CM | POA: Diagnosis not present

## 2023-08-07 DIAGNOSIS — A419 Sepsis, unspecified organism: Secondary | ICD-10-CM | POA: Diagnosis not present

## 2023-08-07 DIAGNOSIS — R6521 Severe sepsis with septic shock: Secondary | ICD-10-CM | POA: Diagnosis not present

## 2023-08-07 LAB — ECHOCARDIOGRAM COMPLETE
AR max vel: 2.16 cm2
AV Area VTI: 2.01 cm2
AV Area mean vel: 2 cm2
AV Mean grad: 6.8 mm[Hg]
AV Peak grad: 11.6 mm[Hg]
Ao pk vel: 1.7 m/s
Area-P 1/2: 4.17 cm2
Height: 63 in
S' Lateral: 2.4 cm
Weight: 2783.09 [oz_av]

## 2023-08-07 LAB — GLUCOSE, CAPILLARY
Glucose-Capillary: 144 mg/dL — ABNORMAL HIGH (ref 70–99)
Glucose-Capillary: 158 mg/dL — ABNORMAL HIGH (ref 70–99)
Glucose-Capillary: 147 mg/dL — ABNORMAL HIGH (ref 70–99)
Glucose-Capillary: 243 mg/dL — ABNORMAL HIGH (ref 70–99)
Glucose-Capillary: 180 mg/dL — ABNORMAL HIGH (ref 70–99)
Glucose-Capillary: 163 mg/dL — ABNORMAL HIGH (ref 70–99)

## 2023-08-07 LAB — CBC
HCT: 29.6 % — ABNORMAL LOW (ref 36.0–46.0)
Hemoglobin: 10.3 g/dL — ABNORMAL LOW (ref 12.0–15.0)
MCH: 29.9 pg (ref 26.0–34.0)
MCHC: 34.8 g/dL (ref 30.0–36.0)
MCV: 85.8 fL (ref 80.0–100.0)
Platelets: 47 10*3/uL — ABNORMAL LOW (ref 150–400)
RBC: 3.45 MIL/uL — ABNORMAL LOW (ref 3.87–5.11)
RDW: 13.8 % (ref 11.5–15.5)
WBC: 7.1 10*3/uL (ref 4.0–10.5)
nRBC: 0 % (ref 0.0–0.2)

## 2023-08-07 LAB — BLOOD CULTURE ID PANEL (REFLEXED) - BCID2

## 2023-08-07 LAB — HEPATIC FUNCTION PANEL
ALT: 127 U/L — ABNORMAL HIGH (ref 0–44)
AST: 56 U/L — ABNORMAL HIGH (ref 15–41)
Albumin: 2.2 g/dL — ABNORMAL LOW (ref 3.5–5.0)
Alkaline Phosphatase: 133 U/L — ABNORMAL HIGH (ref 38–126)
Bilirubin, Direct: 0.9 mg/dL — ABNORMAL HIGH (ref 0.0–0.2)
Indirect Bilirubin: 1.2 mg/dL — ABNORMAL HIGH (ref 0.3–0.9)
Total Bilirubin: 2.1 mg/dL — ABNORMAL HIGH (ref <1.2)
Total Protein: 4.9 g/dL — ABNORMAL LOW (ref 6.5–8.1)

## 2023-08-07 LAB — BASIC METABOLIC PANEL
Anion gap: 11 (ref 5–15)
BUN: 48 mg/dL — ABNORMAL HIGH (ref 8–23)
CO2: 18 mmol/L — ABNORMAL LOW (ref 22–32)
Calcium: 8 mg/dL — ABNORMAL LOW (ref 8.9–10.3)
Chloride: 104 mmol/L (ref 98–111)
Creatinine, Ser: 3.25 mg/dL — ABNORMAL HIGH (ref 0.44–1.00)
GFR, Estimated: 14 mL/min — ABNORMAL LOW (ref >60)
Glucose, Bld: 148 mg/dL — ABNORMAL HIGH (ref 70–99)
Potassium: 3.7 mmol/L (ref 3.5–5.1)
Sodium: 133 mmol/L — ABNORMAL LOW (ref 135–145)

## 2023-08-07 LAB — PHOSPHORUS: Phosphorus: 2.8 mg/dL (ref 2.5–4.6)

## 2023-08-07 LAB — URINE CULTURE: Culture: <10000 — AB

## 2023-08-07 LAB — LIPASE, BLOOD: Lipase: 32 U/L (ref 11–51)

## 2023-08-07 LAB — MAGNESIUM: Magnesium: 3 mg/dL — ABNORMAL HIGH (ref 1.7–2.4)

## 2023-08-07 MED ORDER — SODIUM CHLORIDE 0.9 % IV SOLN
250.0000 mL | INTRAVENOUS | Status: DC
  Administered 2023-08-07: 250 mL via INTRAVENOUS

## 2023-08-07 MED ORDER — LACTATED RINGERS IV BOLUS (SEPSIS)
1000.0000 mL | Freq: Once | INTRAVENOUS | Status: AC
  Administered 2023-08-07: 1000 mL via INTRAVENOUS

## 2023-08-07 MED ORDER — INSULIN ASPART 100 UNIT/ML IJ SOLN
0.0000 [IU] | Freq: Three times a day (TID) | INTRAMUSCULAR | Status: DC
Start: 2023-08-07 — End: 2023-08-19
  Administered 2023-08-07: 3 [IU] via SUBCUTANEOUS
  Administered 2023-08-08: 1 [IU] via SUBCUTANEOUS
  Administered 2023-08-08: 2 [IU] via SUBCUTANEOUS
  Administered 2023-08-08 – 2023-08-09 (×2): 1 [IU] via SUBCUTANEOUS
  Administered 2023-08-10: 5 [IU] via SUBCUTANEOUS
  Administered 2023-08-10: 2 [IU] via SUBCUTANEOUS
  Administered 2023-08-10: 3 [IU] via SUBCUTANEOUS
  Administered 2023-08-11: 2 [IU] via SUBCUTANEOUS
  Administered 2023-08-11: 3 [IU] via SUBCUTANEOUS
  Administered 2023-08-12 – 2023-08-15 (×6): 1 [IU] via SUBCUTANEOUS
  Administered 2023-08-16: 2 [IU] via SUBCUTANEOUS
  Administered 2023-08-17 – 2023-08-18 (×3): 1 [IU] via SUBCUTANEOUS

## 2023-08-07 MED ORDER — SODIUM BICARBONATE 8.4 % IV SOLN
50.0000 meq | Freq: Once | INTRAVENOUS | Status: AC
  Administered 2023-08-07: 50 meq via INTRAVENOUS
  Filled 2023-08-07: qty 50

## 2023-08-07 MED ORDER — NOREPINEPHRINE 4 MG/250ML-% IV SOLN
2.0000 ug/min | INTRAVENOUS | Status: DC
  Administered 2023-08-07: 2 ug/min via INTRAVENOUS
  Administered 2023-08-07: 3 ug/min via INTRAVENOUS
  Administered 2023-08-08: 4 ug/min via INTRAVENOUS
  Filled 2023-08-07 (×3): qty 250

## 2023-08-07 MED ORDER — FAMOTIDINE 20 MG PO TABS
10.0000 mg | ORAL_TABLET | Freq: Every day | ORAL | Status: DC
  Administered 2023-08-07 – 2023-08-09 (×3): 10 mg via ORAL
  Filled 2023-08-07 (×3): qty 1

## 2023-08-07 NOTE — Plan of Care (Signed)
  Problem: Pain Management: Goal: General experience of comfort will improve Outcome: Progressing   Problem: Safety: Goal: Ability to remain free from injury will improve Outcome: Progressing   Problem: Skin Integrity: Goal: Risk for impaired skin integrity will decrease Outcome: Progressing

## 2023-08-07 NOTE — Progress Notes (Addendum)
NAME:  Tina Briggs, MRN:  [id number], DOB:  02-Oct-1945, LOS: 1 ADMISSION DATE:  08/06/2023, CONSULTATION DATE:  08/06/2023 REFERRING MD:  MCDB, CHIEF COMPLAINT:  Hypotension   History of Present Illness:  Tina Briggs is a 77 y.o. female who has a PMH as below including but not limited to HTN, HLD, DM, IBS, anxiety, depression. He presented to [facility] 11/6 with diarrhea, nausea, confusion x 3 days. Was apparently hypotensive since the day earlier. Diarrhea was water like, non bloody. No recent abx use or exposure to known persons with C.diff. BP soft initially but did respond to fluids; however, lactate climbed. CT abd/pelv was done and suggestive of sigmoid diverticulitis. He was started on Zosyn with continued IVF's.   Due to climbing lactate, PCCM called for transfer to [facility] ICU.  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 Mild to mod TI scarring. (Dr. Eliott Nine)   HNP (herniated nucleus pulposus), lumbar    Recurrent   HYPERLIPIDEMIA-MIXED    HYPERTENSION, UNSPECIFIED    Hypertensive retinopathy    Injury of right hip    Lumbar back pain    Macular degeneration    Microscopic hematuria    Neuropathy    OBESITY    Plantar fasciitis    PONV (postoperative nausea and vomiting)    past history only, "sometimes they put a patch on me"   Right eye injury   \  Significant Hospital Events: Including procedures, antibiotic start and stop dates in addition to other pertinent events   11/6 admitted to ICU with septic vs  hypovolemic shock and uncomplicated diverticulitis  Interim History / Subjective:  Feeling better today with continued abdominal pain but improved mentation. She is still tired and having intermittent chills.   Objective   Blood pressure 115/60, pulse 87, temperature 97.9 F (36.6 C), temperature source Oral, resp. rate 16, weight 78.9 kg, SpO2 100%.        Intake/Output Summary (Last 24 hours) at 08/07/2023 8657 Last data filed at 08/06/2023 1622 Gross per 24 hour  Intake 1250 ml  Output --  Net 1250 ml   Filed Weights   08/07/23 0500  Weight: 78.9 kg    Examination: Constitutional:tired appearing elderly female. In no acute distress. HENT: Normocephalic, atraumatic,  Eyes: Sclera non-icteric, PERRL, EOM intact Neck:no jvd Cardio:Regular rate and rhythm. 2+ bilateral radial and dorsalis pedis  pulses. Pulm:Clear to auscultation bilaterally. Normal work of breathing on room air. Abdomen: Soft, epigastric tendernes, no rebound, non-distended, positive bowel sounds. QIO:[id number] for extremity edema. Skin:Warm and dry. Neuro:Alert and oriented x3. No focal deficit noted.  Resolved Hospital Problem list   Acute encephalopathy  Assessment & Plan:  Sepsis with septic/hypovolemic shock 2/2 GNR bacteremia Acute Uncomplicated Sigmoid diverticulitis with severe diarrhea - has chronic recurrent diverticulitis. C. Diff negative Possible UTI vs biliary source - Continue Zosyn. 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 Discussed with outpatient GI who happens to be on service Dr. Ewing Schlein. He will see the patient to discuss findings as planned outpatient.    AKI - suspect hypovolemia. Hyponatremia - suspect hypovolemia. - oral hydration - Follow BMP  Hx HTN, HLD. - Hold PTA Losartan, Lopressor.   Hx DM.- A1c 5.9 Hypoglycemia - Hold home meds - hold  ssi w/ hypoglycemia - cbg monitoring   Transaminitis - possibly low flow state from hypovolemia; though, does have known biliary disease on recent MRI - Continue fluids. - Hold PTA Crestor, Acetaminophen. - Trend LFT's.   Hx Anxiety, Depression. - Hold PTA Venlafaxine, Gabapentin.  Best Practice (right click and "Reselect all SmartList Selections" daily)   Diet/type: clear liquids DVT prophylaxis: LMWH GI prophylaxis: H2B Lines: N/A Foley:  N/A Code Status:  full code Last date of multidisciplinary goals of care discussion [08/07/2023]  Labs   CBC: Recent Labs  Lab 08/06/23 1310 08/06/23 2012  WBC 22.4*  --   NEUTROABS 19.2*  --   HGB 12.2 9.5*  HCT 36.1 28.0*  MCV 88.3  --   PLT 91*  --     Basic Metabolic Panel: Recent Labs  Lab 08/06/23 1310 08/06/23 2012 08/06/23 2118  NA 129* 133* 135  K 4.8 3.6 4.2  CL 98  --  105  CO2 17*  --  19*  GLUCOSE 190*  --  156*  BUN 47*  --  45*  CREATININE 3.51*  --  3.12*  CALCIUM 8.7*  --  7.7*  MG  --   --  1.0*   GFR: CrCl cannot be calculated (Unknown ideal weight.). Recent Labs  Lab 08/06/23 1310 08/06/23 1424 08/06/23 1823 08/06/23 2118  PROCALCITON  --   --   --  58.91  WBC 22.4*  --   --   --   LATICACIDVEN 4.6* 6.8* 3.7*  --     Liver Function Tests: Recent Labs  Lab 08/06/23 1310  AST 142*  ALT 230*  ALKPHOS 80  BILITOT 1.6*  PROT 6.5  ALBUMIN 3.7   No results for input(s): "LIPASE", "AMYLASE" in the last 168 hours. Recent Labs  Lab 08/06/23 2118  AMMONIA <10    ABG    Component Value Date/Time   PHART 7.389 08/06/2023 2012   PCO2ART 22.7 (L) 08/06/2023 2012   PO2ART 68 (L) 08/06/2023 2012   HCO3 13.3 (L) 08/06/2023 2012   TCO2 14 (L) 08/06/2023 2012   ACIDBASEDEF 9.0 (H) 08/06/2023 2012   O2SAT 90 08/06/2023 2012     Coagulation Profile: Recent Labs  Lab 08/06/23 1310  INR 1.4*    Cardiac Enzymes: No results for input(s): "CKTOTAL", "CKMB", "CKMBINDEX", "TROPONINI" in  the last 168 hours.  HbA1C: Hgb A1c MFr Bld  Date/Time Value Ref Range Status  08/06/2023 09:18 PM 5.9 (H) 4.8 - 5.6 % Final    Comment:    (NOTE) Pre diabetes:          5.7%-6.4%  Diabetes:              >6.4%  Glycemic control for   <7.0% adults with diabetes   01/13/2021 02:54 AM 6.7 (H) 4.8 - 5.6 % Final    Comment:    (NOTE)         Prediabetes: 5.7 - 6.4         Diabetes: >6.4  Glycemic control for adults with diabetes: <7.0     CBG: Recent Labs  Lab 08/06/23 1954 08/06/23 2015 08/06/23 2327 08/07/23 0402  GLUCAP 62* 278* 121* 144*    Review of Systems:   Epigastric abdominal pain.  Past Medical History:  She,  has a past medical history of Allergic rhinitis, Anemia, Anxiety, Aspiration pneumonia (HCC), CATARACT, RIGHT EYE, Colon polyps, Depression, Diverticulitis, DM, Dyslipidemia, Elevated alkaline phosphatase level, GERD, GERD (gastroesophageal reflux disease), Glomerulonephritis (11/18/2017), HNP (herniated nucleus pulposus), lumbar, HYPERLIPIDEMIA-MIXED, HYPERTENSION, UNSPECIFIED, Hypertensive retinopathy, Injury of right hip, Lumbar back pain, Macular degeneration, Microscopic hematuria, Neuropathy, OBESITY, Plantar fasciitis, PONV (postoperative nausea and vomiting), and Right eye injury.   Surgical History:   Past Surgical History:  Procedure Laterality Date   ABDOMINAL HYSTERECTOMY     APPENDECTOMY     BACK SURGERY     lumbar '09"microdiscectomy"   BREAST BIOPSY     BREAST BIOPSY Right 05/19/2023   MM RT BREAST BX W LOC DEV EA AD LESION IMG BX SPEC STEREO GUIDE 05/19/2023 [facility]   BREAST BIOPSY Right 05/19/2023   MM RT BREAST BX W LOC DEV 1ST LESION IMAGE BX SPEC STEREO GUIDE 05/19/2023 [facility]   breat Bx benign     CHOLECYSTECTOMY     open   COLONOSCOPY WITH PROPOFOL N/A 11/22/2014   Procedure: COLONOSCOPY WITH PROPOFOL;  Surgeon: Charolett Bumpers, MD;  Location: [facility];  Service: Endoscopy;  Laterality: N/A;    ERCP N/A 05/26/2015   Procedure: ENDOSCOPIC RETROGRADE CHOLANGIOPANCREATOGRAPHY (ERCP)   (DOING CASE IN MAIN OR);  Surgeon: Dorena Cookey, MD;  Location: [facility];  Service: Gastroenterology;  Laterality: N/A;   ERCP N/A 06/06/2015   Procedure: ENDOSCOPIC RETROGRADE CHOLANGIOPANCREATOGRAPHY (ERCP);  Surgeon: Vida Rigger, MD;  Location: [facility];  Service: Endoscopy;  Laterality: N/A;   ERCP N/A 01/12/2021   Procedure: ENDOSCOPIC RETROGRADE CHOLANGIOPANCREATOGRAPHY (ERCP);  Surgeon: Vida Rigger, MD;  Location: [facility];  Service: Endoscopy;  Laterality: N/A;   ESOPHAGOGASTRODUODENOSCOPY (EGD) WITH PROPOFOL N/A 11/22/2014   Procedure: ESOPHAGOGASTRODUODENOSCOPY (EGD) WITH PROPOFOL;  Surgeon: Charolett Bumpers, MD;  Location: [facility];  Service: Endoscopy;  Laterality: N/A;   EYE SURGERY Bilateral    cataracts   GANGLION CYST EXCISION Right    LIPOMA EXCISION  1993   From thigh 1993   LUMBAR FUSION  09/16/2018   LUMBAR LAMINECTOMY/DECOMPRESSION MICRODISCECTOMY Left 12/31/2017   Procedure: Laminectomy and Foraminotomy - Lumbar Three-Four - left Extraforaminal diskectomy Lumbar Four-Five left;  Surgeon: Tia Alert, MD;  Location: [facility] OR;  Service: Neurosurgery;  Laterality: Left;   PERONEAL NERVE DECOMPRESSION Left 12/31/2017   Procedure: Left PERONEAL NERVE DECOMPRESSION;  Surgeon: Tia Alert, MD;  Location: [facility] OR;  Service: Neurosurgery;  Laterality: Left;  left   PERONEAL NERVE DECOMPRESSION     REMOVAL OF STONES  01/12/2021   Procedure: REMOVAL OF STONES;  Surgeon: Vida Rigger, MD;  Location: [facility];  Service: Endoscopy;;   Right ganglion cyst     Right ganglion cyst remove     TUBAL LIGATION       Social History:   reports that she has never smoked. She has never used smokeless tobacco. She reports that she does not drink alcohol and does not use drugs.   Family History:  Her family history includes Alzheimer's disease in her father; Cancer in her maternal aunt,  maternal grandmother, and paternal grandmother; Diabetes Mellitus II in her brother; Healthy in her son and son; Heart disease in  her mother; Tremor in her father.   Allergies Allergies  Allergen Reactions   Codeine Shortness Of Breath   Clavulanic Acid     Pt tolerated Zosyn 11/6   Flagyl [Metronidazole]    Other Other (See Comments)   Prevacid [Lansoprazole]    Tetracyclines & Related Itching   Trulicity [Dulaglutide]    Azithromycin Itching and Rash   Erythromycin Itching and Rash   Morphine And Codeine Itching and Rash   Septra [Sulfamethoxazole-Trimethoprim] Itching and Rash     Home Medications  Prior to Admission medications   Medication Sig Start Date End Date Taking? Authorizing Provider  acetaminophen (TYLENOL) 500 MG tablet Take 1,000 mg by mouth 2 (two) times daily as needed for moderate pain or headache.    Yes [provider]  cholecalciferol (VITAMIN D) 1000 units tablet Take 5,000 Units by mouth daily.   Yes [provider]  colestipol (COLESTID) 1 g tablet Take 2 tablets (2 g total) by mouth in the morning and at bedtime. Patient taking differently: Take 2 g by mouth in the morning and at bedtime. *usually takes 1 qam and 2 qpm* 08/21/22  Yes Celso Amy, PA-C  CRESTOR 40 MG tablet TAKE 1 TABLET AT BEDTIME Patient taking differently: Take 40 mg by mouth at bedtime. 04/19/13  Yes Wall, Jesse Sans, MD  dapagliflozin propanediol (FARXIGA) 5 MG TABS tablet Take 5 mg by mouth daily.   Yes [provider]  desloratadine (CLARINEX) 5 MG tablet Take 1 tablet (5 mg total) by mouth daily. 11/21/22  Yes [name], MD  diclofenac Sodium (VOLTAREN) 1 % GEL Apply 4 g topically 4 (four) times daily. 11/03/21  Yes Melene Plan, DO  fluticasone (FLONASE) 50 MCG/ACT nasal spray Place 2 sprays into both nostrils daily as needed for allergies.    Yes [provider]  gabapentin (NEURONTIN) 300 MG capsule Take 300-600 mg by mouth See admin instructions.  300mg  in the morning 600mg  at night 10/26/18  Yes [provider]  losartan (COZAAR) 50 MG tablet Take 1 tablet (50 mg total) by mouth 2 (two) times daily. 05/09/23  Yes   Multiple Vitamins-Minerals (PRESERVISION AREDS 2 PO) Take 1 tablet by mouth 2 (two) times daily.   Yes [provider]  NOVOLOG FLEXPEN RELION 100 UNIT/ML FlexPen Inject 8-10 Units into the skin 3 (three) times daily as needed. BS 12/05/20  Yes [provider]  rosuvastatin (CRESTOR) 40 MG tablet Take 1 tablet (40 mg total) by mouth at bedtime. 08/15/22  Yes [name], MD  Semaglutide,0.25 or 0.5MG /DOS, (OZEMPIC, 0.25 OR 0.5 MG/DOSE,) 2 MG/3ML SOPN Inject 0.5 mg into the skin once a week. 04/01/23  Yes   venlafaxine XR (EFFEXOR-XR) 75 MG 24 hr capsule Take 75 mg by mouth every morning.    Yes [provider]  vitamin B-12 (CYANOCOBALAMIN) 1000 MCG tablet Take 1,000 mcg by mouth 3 (three) times a week. Take on Sundays, Wednesdays and Fridays.   Yes [provider]  cephALEXin (KEFLEX) 500 MG capsule Take 1 capsule (500 mg total) by mouth 3 (three) times daily. Patient not taking: Reported on 08/06/2023 05/14/23   Lenn Sink, DPM  FREESTYLE LITE test strip USE 1 STRIP TO CHECK GLUCOSE 4 TIMES DAILY 12/04/20   [provider]  methocarbamol (ROBAXIN) 500 MG tablet Take 1 tablet (500 mg total) by mouth 2 (two) times daily. Patient not taking: Reported on 08/06/2023 11/03/21   Melene Plan, DO  methylPREDNISolone (MEDROL DOSEPAK) 4 MG  TBPK tablet Day 1: 8mg  before breakfast, 4 mg after lunch, 4 mg after supper, and 8 mg at bedtime Day 2: 4 mg before breakfast, 4 mg after lunch, 4 mg  after supper, and 8 mg  at bedtime Day 3:  4 mg  before breakfast, 4 mg  after lunch, 4 mg after supper, and 4 mg  at bedtime Day 4: 4 mg  before breakfast, 4 mg  after lunch, and 4 mg at bedtime Day 5: 4 mg  before breakfast and 4 mg at bedtime Day 6: 4 mg  before breakfast Patient not taking: Reported on  08/06/2023 11/03/21   Melene Plan, DO  metoprolol tartrate (LOPRESSOR) 100 MG tablet Take one tablet by mouth 2 hours prior to your CT Patient not taking: Reported on 08/06/2023 05/08/21   Lyn Records, MD     Critical care time: 75    Rocky Morel, DO Internal Medicine Resident, PGY-2 Pager# [phone] [facility] 08/07/2023 6:33 AM  For contact information, see Amion. If no response to pager, please call PCCM consult pager. After hours, 7PM- 7AM, please call Elink.

## 2023-08-07 NOTE — Progress Notes (Signed)
PHARMACY - PHYSICIAN COMMUNICATION CRITICAL VALUE ALERT - BLOOD CULTURE IDENTIFICATION (BCID)  Tina Briggs is an 77 y.o. female who presented to [facility] on 08/06/2023 with a chief complaint of sepsis due to diverticulitis.   Assessment:  BCID positive for E Coli. No resistance genes.   Name of physician (or Provider) Contacted: Rocky Morel, DO  Current antibiotics: Zosyn  Changes to prescribed antibiotics recommended:  Spoke with MD about options to treat the bacteremia/diverticulitis. Ceftriaxone / Flagyl may be an option, but the patient has a documented allergy to Flagyl.  Will continue Zosyn at this time.   Results for orders placed or performed during the hospital encounter of 08/06/23  Blood Culture ID Panel (Reflexed) (Collected: 08/06/2023  1:03 PM)  Result Value Ref Range   Enterococcus faecalis NOT DETECTED NOT DETECTED   Enterococcus Faecium NOT DETECTED NOT DETECTED   Listeria monocytogenes NOT DETECTED NOT DETECTED   Staphylococcus species NOT DETECTED NOT DETECTED   Staphylococcus aureus (BCID) NOT DETECTED NOT DETECTED   Staphylococcus epidermidis NOT DETECTED NOT DETECTED   Staphylococcus lugdunensis NOT DETECTED NOT DETECTED   Streptococcus species NOT DETECTED NOT DETECTED   Streptococcus agalactiae NOT DETECTED NOT DETECTED   Streptococcus pneumoniae NOT DETECTED NOT DETECTED   Streptococcus pyogenes NOT DETECTED NOT DETECTED   A.calcoaceticus-baumannii NOT DETECTED NOT DETECTED   Bacteroides fragilis NOT DETECTED NOT DETECTED   Enterobacterales DETECTED (A) NOT DETECTED   Enterobacter cloacae complex NOT DETECTED NOT DETECTED   Escherichia coli DETECTED (A) NOT DETECTED   Klebsiella aerogenes NOT DETECTED NOT DETECTED   Klebsiella oxytoca NOT DETECTED NOT DETECTED   Klebsiella pneumoniae NOT DETECTED NOT DETECTED   Proteus species NOT DETECTED NOT DETECTED   Salmonella species NOT DETECTED NOT DETECTED   Serratia marcescens NOT DETECTED NOT  DETECTED   Haemophilus influenzae NOT DETECTED NOT DETECTED   Neisseria meningitidis NOT DETECTED NOT DETECTED   Pseudomonas aeruginosa NOT DETECTED NOT DETECTED   Stenotrophomonas maltophilia NOT DETECTED NOT DETECTED   Candida albicans NOT DETECTED NOT DETECTED   Candida auris NOT DETECTED NOT DETECTED   Candida glabrata NOT DETECTED NOT DETECTED   Candida krusei NOT DETECTED NOT DETECTED   Candida parapsilosis NOT DETECTED NOT DETECTED   Candida tropicalis NOT DETECTED NOT DETECTED   Cryptococcus neoformans/gattii NOT DETECTED NOT DETECTED   CTX-M ESBL NOT DETECTED NOT DETECTED   Carbapenem resistance IMP NOT DETECTED NOT DETECTED   Carbapenem resistance KPC NOT DETECTED NOT DETECTED   Carbapenem resistance NDM NOT DETECTED NOT DETECTED   Carbapenem resist OXA 48 LIKE NOT DETECTED NOT DETECTED   Carbapenem resistance VIM NOT DETECTED NOT DETECTED    [name] 08/07/2023  2:46 PM

## 2023-08-07 NOTE — Plan of Care (Signed)
  Problem: Education: Goal: Knowledge of General Education information will improve Description: Including pain rating scale, medication(s)/side effects and non-pharmacologic comfort measures Outcome: Progressing   Problem: Clinical Measurements: Goal: Will remain free from infection Outcome: Progressing Goal: Diagnostic test results will improve Outcome: Progressing Goal: Respiratory complications will improve Outcome: Progressing Goal: Cardiovascular complication will be avoided Outcome: Progressing   Problem: Clinical Measurements: Goal: Diagnostic test results will improve Outcome: Progressing   Problem: Clinical Measurements: Goal: Respiratory complications will improve Outcome: Progressing   Problem: Clinical Measurements: Goal: Cardiovascular complication will be avoided Outcome: Progressing   Problem: Activity: Goal: Risk for activity intolerance will decrease Outcome: Progressing

## 2023-08-07 NOTE — Progress Notes (Signed)
I personally saw the patient and performed a substantive portion of this encounter, including a complete performance of at least one of the key components (MDM, Hx and/or Exam), in conjunction with the resident.   Admitted with sepsis syndrome, heart nausea and diarrhea with AKI. Initially thought to be due to diverticulitis She required low-dose Levophed, blood pressure improved this morning. Last episode of loose stool was 24 hours ago, afebrile On exam -right upper quadrant tenderness, no guarding, clear breath sounds bilateral, S1-S2 regular, no edema  Await today's labs, procalcitonin was high, magnesium 1.0 repleted MRI of abdomen will 10/31 showed focal stricture of left bile duct with some stranding near porta hepatis concerning for cholangiocarcinoma. She is status postcholecystectomy.  Impression/plan Septic shock -differential includes diverticulitis and cholangitis, GNR bacteremia, await ID , continue Zosyn in the meantime. Will obtain GI input since she has been seeing them in the past. Lactate has resolved, taper Levophed to off.  AKI -continue IV fluids and monitor electrolytes, hypomagnesemia is been repleted  Transfer to floor once off pressors and to [facility] My independent critical care time was 31 minutes   Ulice Follett V. Vassie Loll MD

## 2023-08-07 NOTE — Progress Notes (Signed)
Echocardiogram 2D Echocardiogram has been performed.  Tina Briggs 08/07/2023, 10:27 AM

## 2023-08-07 NOTE — Consult Note (Signed)
Reason for Consult: Abnormal MRI abnormal liver tests Referring Physician: Hospital team  Tina Briggs is an 77 y.o. female.  HPI: Patient seen and examined and we discussed the case with her husband and her hospital computer chart reviewed as well as our office computer chart and she was seen recently in the office by me with some symptoms of possibly a passed CBD stone and she was asymptomatic at the time and we did a MRCP to look for CBD stones which instead it showed up a stricture and we do plan to see her in the office to discuss further workup and plans and her liver tests at that time were normal as was her BUN and creatinine however on Monday she began having nausea vomiting diarrhea and some abdominal pain very different than her previous CBD stone attacks and she was unable to eat or drink and finally presented to the hospital with sepsis dehydration etc. 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 Mild to mod TI scarring. (Dr. Eliott Nine)   HNP (herniated nucleus pulposus), lumbar    Recurrent   HYPERLIPIDEMIA-MIXED    HYPERTENSION, UNSPECIFIED    Hypertensive retinopathy    Injury of right hip    Lumbar back pain    Macular degeneration    Microscopic hematuria    Neuropathy    OBESITY    Plantar fasciitis    PONV (postoperative nausea and vomiting)    past history only, "sometimes they put a patch on me"   Right eye injury     Past Surgical History:  Procedure Laterality Date   ABDOMINAL HYSTERECTOMY     APPENDECTOMY      BACK SURGERY     lumbar '09"microdiscectomy"   BREAST BIOPSY     BREAST BIOPSY Right 05/19/2023   MM RT BREAST BX W LOC DEV EA AD LESION IMG BX SPEC STEREO GUIDE 05/19/2023 [facility]   BREAST BIOPSY Right 05/19/2023   MM RT BREAST BX W LOC DEV 1ST LESION IMAGE BX SPEC STEREO GUIDE 05/19/2023 [facility]   breat Bx benign     CHOLECYSTECTOMY     open   COLONOSCOPY WITH PROPOFOL N/A 11/22/2014   Procedure: COLONOSCOPY WITH PROPOFOL;  Surgeon: Charolett Bumpers, MD;  Location: [facility];  Service: Endoscopy;  Laterality: N/A;   ERCP N/A 05/26/2015   Procedure: ENDOSCOPIC RETROGRADE CHOLANGIOPANCREATOGRAPHY (ERCP)   (DOING CASE IN MAIN OR);  Surgeon: Dorena Cookey, MD;  Location: [facility];  Service: Gastroenterology;  Laterality: N/A;   ERCP N/A 06/06/2015   Procedure: ENDOSCOPIC RETROGRADE CHOLANGIOPANCREATOGRAPHY (ERCP);  Surgeon: Vida Rigger, MD;  Location: [facility];  Service: Endoscopy;  Laterality: N/A;   ERCP N/A 01/12/2021   Procedure: ENDOSCOPIC RETROGRADE CHOLANGIOPANCREATOGRAPHY (ERCP);  Surgeon: Vida Rigger, MD;  Location: [facility];  Service: Endoscopy;  Laterality: N/A;   ESOPHAGOGASTRODUODENOSCOPY (EGD) WITH PROPOFOL N/A 11/22/2014   Procedure: ESOPHAGOGASTRODUODENOSCOPY (EGD) WITH PROPOFOL;  Surgeon: Charolett Bumpers,  MD;  Location: [facility];  Service: Endoscopy;  Laterality: N/A;   EYE SURGERY Bilateral    cataracts   GANGLION CYST EXCISION Right    LIPOMA EXCISION  1993   From thigh 1993   LUMBAR FUSION  09/16/2018   LUMBAR LAMINECTOMY/DECOMPRESSION MICRODISCECTOMY Left 12/31/2017   Procedure: Laminectomy and Foraminotomy - Lumbar Three-Four - left Extraforaminal diskectomy Lumbar Four-Five left;  Surgeon: Tia Alert, MD;  Location: [facility] OR;  Service: Neurosurgery;  Laterality: Left;   PERONEAL NERVE DECOMPRESSION Left 12/31/2017   Procedure: Left PERONEAL NERVE DECOMPRESSION;  Surgeon: Tia Alert, MD;  Location: [facility] OR;  Service:  Neurosurgery;  Laterality: Left;  left   PERONEAL NERVE DECOMPRESSION     REMOVAL OF STONES  01/12/2021   Procedure: REMOVAL OF STONES;  Surgeon: Vida Rigger, MD;  Location: [facility];  Service: Endoscopy;;   Right ganglion cyst     Right ganglion cyst remove     TUBAL LIGATION      Family History  Problem Relation Age of Onset   Cancer Maternal Aunt        breast cancer   Cancer Maternal Grandmother        kidney cancer    Cancer Paternal Grandmother        bone cancer    Heart disease Mother    Alzheimer's disease Father    Tremor Father        possible PD   Diabetes Mellitus II Brother    Healthy Son    Healthy Son     Social History:  reports that she has never smoked. She has never used smokeless tobacco. She reports that she does not drink alcohol and does not use drugs.  Allergies:  Allergies  Allergen Reactions   Codeine Shortness Of Breath   Clavulanic Acid     Pt tolerated Zosyn 11/6   Flagyl [Metronidazole]    Other Other (See Comments)   Prevacid [Lansoprazole]    Tetracyclines & Related Itching   Trulicity [Dulaglutide]    Azithromycin Itching and Rash   Erythromycin Itching and Rash   Morphine And Codeine Itching and Rash   Septra [Sulfamethoxazole-Trimethoprim] Itching and Rash    Medications: I have reviewed the patient's current medications.  Results for orders placed or performed during the hospital encounter of 08/06/23 (from the past 48 hour(s))  Blood Culture (routine x 2)     Status: None (Preliminary result)   Collection Time: 08/06/23  1:03 PM   Specimen: BLOOD LEFT FOREARM  Result Value Ref Range   Specimen Description      BLOOD LEFT FOREARM Performed at [facility], [address] [place], [place] [address]    Special Requests      BOTTLES DRAWN AEROBIC AND ANAEROBIC Blood Culture adequate volume Performed at [facility], [address] [place], [place] [address]    Culture  Setup Time      GRAM  NEGATIVE RODS AEROBIC BOTTLE ONLY CRITICAL RESULT CALLED TO, READ BACK BY AND VERIFIED WITH: PHARMD J.FRENS AT 08/07/2023 BY T.SAAD. Performed at [facility], [address] [place], [place] [address]    Culture GRAM NEGATIVE RODS    Report Status PENDING   Blood Culture ID Panel (Reflexed)     Status: Abnormal   Collection Time: 08/06/23  1:03 PM  Result Value Ref Range   Enterococcus faecalis NOT DETECTED NOT DETECTED   Enterococcus Faecium NOT DETECTED NOT DETECTED   Listeria  monocytogenes NOT DETECTED NOT DETECTED   Staphylococcus species NOT DETECTED NOT DETECTED   Staphylococcus aureus (BCID) NOT DETECTED NOT DETECTED   Staphylococcus epidermidis NOT DETECTED NOT DETECTED   Staphylococcus lugdunensis NOT DETECTED NOT DETECTED   Streptococcus species NOT DETECTED NOT DETECTED   Streptococcus agalactiae NOT DETECTED NOT DETECTED   Streptococcus pneumoniae NOT DETECTED NOT DETECTED   Streptococcus pyogenes NOT DETECTED NOT DETECTED   A.calcoaceticus-baumannii NOT DETECTED NOT DETECTED   Bacteroides fragilis NOT DETECTED NOT DETECTED   Enterobacterales DETECTED (A) NOT DETECTED    Comment: Enterobacterales represent a large order of gram negative bacteria, not a single organism. CRITICAL RESULT CALLED TO, READ BACK BY AND VERIFIED WITH: PHARMD J.FRENS AT 08/07/2023 BY T.SAAD.    Enterobacter cloacae complex NOT DETECTED NOT DETECTED   Escherichia coli DETECTED (A) NOT DETECTED    Comment: CRITICAL RESULT CALLED TO, READ BACK BY AND VERIFIED WITH: PHARMD J.FRENS AT 08/07/2023 BY T.SAAD.    Klebsiella aerogenes NOT DETECTED NOT DETECTED   Klebsiella oxytoca NOT DETECTED NOT DETECTED   Klebsiella pneumoniae NOT DETECTED NOT DETECTED   Proteus species NOT DETECTED NOT DETECTED   Salmonella species NOT DETECTED NOT DETECTED   Serratia marcescens NOT DETECTED NOT DETECTED   Haemophilus influenzae NOT DETECTED NOT DETECTED   Neisseria meningitidis NOT DETECTED NOT DETECTED    Pseudomonas aeruginosa NOT DETECTED NOT DETECTED   Stenotrophomonas maltophilia NOT DETECTED NOT DETECTED   Candida albicans NOT DETECTED NOT DETECTED   Candida auris NOT DETECTED NOT DETECTED   Candida glabrata NOT DETECTED NOT DETECTED   Candida krusei NOT DETECTED NOT DETECTED   Candida parapsilosis NOT DETECTED NOT DETECTED   Candida tropicalis NOT DETECTED NOT DETECTED   Cryptococcus neoformans/gattii NOT DETECTED NOT DETECTED   CTX-M ESBL NOT DETECTED NOT DETECTED   Carbapenem resistance IMP NOT DETECTED NOT DETECTED   Carbapenem resistance KPC NOT DETECTED NOT DETECTED   Carbapenem resistance NDM NOT DETECTED NOT DETECTED   Carbapenem resist OXA 48 LIKE NOT DETECTED NOT DETECTED   Carbapenem resistance VIM NOT DETECTED NOT DETECTED    Comment: Performed at [facility], [address], [place], [place] [address]  Blood Culture (routine x 2)     Status: None (Preliminary result)   Collection Time: 08/06/23  1:08 PM   Specimen: BLOOD LEFT HAND  Result Value Ref Range   Specimen Description      BLOOD LEFT HAND Performed at [facility], [address], [place], [place] [address]    Special Requests      BOTTLES DRAWN AEROBIC AND ANAEROBIC Blood Culture adequate volume Performed at [facility], [address], [place], [place] [address]    Culture      NO GROWTH < 12 HOURS Performed at [facility], [address] [place], [place] [address]    Report Status PENDING   Lactic acid, plasma     Status: Abnormal   Collection Time: 08/06/23  1:10 PM  Result Value Ref Range   Lactic Acid, Venous 4.6 (HH) 0.5 - 1.9 mmol/L    Comment: CRITICAL RESULT CALLED TO, READ BACK BY AND VERIFIED WITH: Vladimir Faster, RN 1417 11.6.2024 [facility] Performed at [facility], [address] [place], [place] [address]   Comprehensive metabolic panel     Status: Abnormal   Collection Time: 08/06/23  1:10 PM  Result Value Ref Range    Sodium 129 (L) 135 - 145 mmol/L   Potassium 4.8  3.5 - 5.1 mmol/L   Chloride 98 98 - 111 mmol/L   CO2 17 (L) 22 - 32 mmol/L   Glucose, Bld 190 (H) 70 - 99 mg/dL    Comment: Glucose reference range applies only to samples taken after fasting for at least 8 hours.   BUN 47 (H) 8 - 23 mg/dL   Creatinine, Ser 0.27 (H) 0.44 - 1.00 mg/dL   Calcium 8.7 (L) 8.9 - 10.3 mg/dL   Total Protein 6.5 6.5 - 8.1 g/dL   Albumin 3.7 3.5 - 5.0 g/dL   AST 253 (H) 15 - 41 U/L   ALT 230 (H) 0 - 44 U/L   Alkaline Phosphatase 80 38 - 126 U/L   Total Bilirubin 1.6 (H) <1.2 mg/dL   GFR, Estimated 13 (L) >60 mL/min    Comment: (NOTE) Calculated using the CKD-EPI Creatinine Equation (2021)    Anion gap 14 5 - 15    Comment: Performed at [facility], [address], [place], [place] [address]  CBC with Differential     Status: Abnormal   Collection Time: 08/06/23  1:10 PM  Result Value Ref Range   WBC 22.4 (H) 4.0 - 10.5 K/uL   RBC 4.09 3.87 - 5.11 MIL/uL   Hemoglobin 12.2 12.0 - 15.0 g/dL   HCT 34.7 42.5 - 95.6 %   MCV 88.3 80.0 - 100.0 fL   MCH 29.8 26.0 - 34.0 pg   MCHC 33.8 30.0 - 36.0 g/dL   RDW 38.7 56.4 - 33.2 %   Platelets 91 (L) 150 - 400 K/uL    Comment: Immature Platelet Fraction may be clinically indicated, consider ordering this additional test [id number]    nRBC 0.0 0.0 - 0.2 %   Neutrophils Relative % 85 %   Neutro Abs 19.2 (H) 1.7 - 7.7 K/uL   Lymphocytes Relative 3 %   Lymphs Abs 0.6 (L) 0.7 - 4.0 K/uL   Monocytes Relative 5 %   Monocytes Absolute 1.1 (H) 0.1 - 1.0 K/uL   Eosinophils Relative 0 %   Eosinophils Absolute 0.0 0.0 - 0.5 K/uL   Basophils Relative 0 %   Basophils Absolute 0.1 0.0 - 0.1 K/uL   WBC Morphology INCREASED BANDS (>20% BANDS)    Smear Review MORPHOLOGY UNREMARKABLE    Immature Granulocytes 7 %   Abs Immature Granulocytes 1.49 (H) 0.00 - 0.07 K/uL   Burr Cells PRESENT     Comment: Performed at [facility], [address] [place], [place], [place] [address]  Protime-INR     Status: Abnormal   Collection Time: 08/06/23  1:10 PM  Result Value Ref Range   Prothrombin Time 17.1 (H) 11.4 - 15.2 seconds   INR 1.4 (H) 0.8 - 1.2    Comment: (NOTE) INR goal varies based on device and disease states. Performed at [facility], [address], [place], [place] [address]   APTT     Status: None   Collection Time: 08/06/23  1:10 PM  Result Value Ref Range   aPTT 33 24 - 36 seconds    Comment: Performed at [facility], [address], [place], [place] [address]  Resp panel by RT-PCR (RSV, Flu A&B, Covid) Anterior Nasal Swab     Status: None   Collection Time: 08/06/23  1:16 PM   Specimen: Anterior Nasal Swab  Result Value Ref Range   SARS Coronavirus 2 by RT PCR NEGATIVE NEGATIVE    Comment: (NOTE) SARS-CoV-2 target nucleic acids  are NOT DETECTED.  The SARS-CoV-2 RNA is generally detectable in upper respiratory specimens during the acute phase of infection. The lowest concentration of SARS-CoV-2 viral copies this assay can detect is 138 copies/mL. A negative result does not preclude SARS-Cov-2 infection and should not be used as the sole basis for treatment or other patient management decisions. A negative result may occur with  improper specimen collection/handling, submission of specimen other than nasopharyngeal swab, presence of viral mutation(s) within the areas targeted by this assay, and inadequate number of viral copies(<138 copies/mL). A negative result must be combined with clinical observations, patient history, and epidemiological information. The expected result is Negative.  Fact Sheet for Patients:  BloggerCourse.com  Fact Sheet for Healthcare Providers:  SeriousBroker.it  This test is no t yet approved or cleared by the [place] FDA and  has been authorized for detection and/or diagnosis of  SARS-CoV-2 by FDA under an Emergency Use Authorization (EUA). This EUA will remain  in effect (meaning this test can be used) for the duration of the COVID-19 declaration under Section 564(b)(1) of the Act, 21 U.S.C.section 360bbb-3(b)(1), unless the authorization is terminated  or revoked sooner.       Influenza A by PCR NEGATIVE NEGATIVE   Influenza B by PCR NEGATIVE NEGATIVE    Comment: (NOTE) The Xpert Xpress SARS-CoV-2/FLU/RSV plus assay is intended as an aid in the diagnosis of influenza from Nasopharyngeal swab specimens and should not be used as a sole basis for treatment. Nasal washings and aspirates are unacceptable for Xpert Xpress SARS-CoV-2/FLU/RSV testing.  Fact Sheet for Patients: BloggerCourse.com  Fact Sheet for Healthcare Providers: SeriousBroker.it  This test is not yet approved or cleared by the [place] FDA and has been authorized for detection and/or diagnosis of SARS-CoV-2 by FDA under an Emergency Use Authorization (EUA). This EUA will remain in effect (meaning this test can be used) for the duration of the COVID-19 declaration under Section 564(b)(1) of the Act, 21 U.S.C. section 360bbb-3(b)(1), unless the authorization is terminated or revoked.     Resp Syncytial Virus by PCR NEGATIVE NEGATIVE    Comment: (NOTE) Fact Sheet for Patients: BloggerCourse.com  Fact Sheet for Healthcare Providers: SeriousBroker.it  This test is not yet approved or cleared by the [place] FDA and has been authorized for detection and/or diagnosis of SARS-CoV-2 by FDA under an Emergency Use Authorization (EUA). This EUA will remain in effect (meaning this test can be used) for the duration of the COVID-19 declaration under Section 564(b)(1) of the Act, 21 U.S.C. section 360bbb-3(b)(1), unless the authorization is terminated or revoked.  Performed at [facility], [address] [place], [place] [address]   Lactic acid, plasma     Status: Abnormal   Collection Time: 08/06/23  2:24 PM  Result Value Ref Range   Lactic Acid, Venous 6.8 (HH) 0.5 - 1.9 mmol/L    Comment: CRITICAL RESULT CALLED TO, READ BACK BY AND VERIFIED WITH:  Pasty Arch AT 16:46 ON 08/06/23 BY Brock Bad Performed at [facility], [address] [place], [place] [address]   Urinalysis, w/ Reflex to Culture (Infection Suspected) -Urine, Clean Catch     Status: Abnormal   Collection Time: 08/06/23  2:24 PM  Result Value Ref Range   Specimen Source URINE, CLEAN CATCH    Color, Urine YELLOW YELLOW   APPearance HAZY (A) CLEAR   Specific Gravity, Urine 1.011 1.005 - 1.030   pH 5.5 5.0 - 8.0   Glucose, UA  250 (A) NEGATIVE mg/dL   Hgb urine dipstick MODERATE (A) NEGATIVE   Bilirubin Urine NEGATIVE NEGATIVE   Ketones, ur NEGATIVE NEGATIVE mg/dL   Protein, ur 30 (A) NEGATIVE mg/dL   Nitrite NEGATIVE NEGATIVE   Leukocytes,Ua MODERATE (A) NEGATIVE   RBC / HPF 6-10 0 - 5 RBC/hpf   WBC, UA 11-20 0 - 5 WBC/hpf    Comment:        Reflex urine culture not performed if WBC <=10, OR if Squamous epithelial cells >5. If Squamous epithelial cells >5 suggest recollection.    Bacteria, UA RARE (A) NONE SEEN   Squamous Epithelial / HPF 0-5 0 - 5 /HPF   WBC Clumps PRESENT    Mucus PRESENT     Comment: Performed at [facility], [address], [place], [place] [address]  C Difficile Quick Screen w PCR reflex     Status: None   Collection Time: 08/06/23  2:24 PM   Specimen: STOOL  Result Value Ref Range   C Diff antigen NEGATIVE NEGATIVE   C Diff toxin NEGATIVE NEGATIVE   C Diff interpretation No C. difficile detected.     Comment: Performed at [facility], [address] [place], [place] [address]  Lactic acid, plasma     Status: Abnormal   Collection Time: 08/06/23  6:23 PM  Result Value Ref Range   Lactic Acid,  Venous 3.7 (HH) 0.5 - 1.9 mmol/L    Comment: CRITICAL VALUE NOTED.  VALUE IS CONSISTENT WITH PREVIOUSLY REPORTED AND CALLED VALUE. Performed at [facility], [address], [place], [place] [address]   MRSA Next Gen by PCR, Nasal     Status: None   Collection Time: 08/06/23  7:53 PM   Specimen: Nasal Mucosa; Nasal Swab  Result Value Ref Range   MRSA by PCR Next Gen NOT DETECTED NOT DETECTED    Comment: (NOTE) The GeneXpert MRSA Assay (FDA approved for NASAL specimens only), is one component of a comprehensive MRSA colonization surveillance program. It is not intended to diagnose MRSA infection nor to guide or monitor treatment for MRSA infections. Test performance is not FDA approved in patients less than 8 years old. Performed at [facility], [address] [place], [place] [address]   Glucose, capillary     Status: Abnormal   Collection Time: 08/06/23  7:54 PM  Result Value Ref Range   Glucose-Capillary 62 (L) 70 - 99 mg/dL    Comment: Glucose reference range applies only to samples taken after fasting for at least 8 hours.  I-STAT 7, (LYTES, BLD GAS, ICA, H+H)     Status: Abnormal   Collection Time: 08/06/23  8:12 PM  Result Value Ref Range   pH, Arterial 7.389 7.35 - 7.45   pCO2 arterial 22.7 (L) 32 - 48 mmHg   pO2, Arterial 68 (L) 83 - 108 mmHg   Bicarbonate 13.3 (L) 20.0 - 28.0 mmol/L   TCO2 14 (L) 22 - 32 mmol/L   O2 Saturation 90 %   Acid-base deficit 9.0 (H) 0.0 - 2.0 mmol/L   Sodium 133 (L) 135 - 145 mmol/L   Potassium 3.6 3.5 - 5.1 mmol/L   Calcium, Ion 1.07 (L) 1.15 - 1.40 mmol/L   HCT 28.0 (L) 36.0 - 46.0 %   Hemoglobin 9.5 (L) 12.0 - 15.0 g/dL   Patient temperature 657.8 F    Collection site Brachial    Drawn by RT    Sample type ARTERIAL  Comment NOTIFIED PHYSICIAN   Glucose, capillary     Status: Abnormal   Collection Time: 08/06/23  8:15 PM  Result Value Ref Range   Glucose-Capillary 278 (H) 70 - 99 mg/dL    Comment:  Glucose reference range applies only to samples taken after fasting for at least 8 hours.  Ammonia     Status: None   Collection Time: 08/06/23  9:18 PM  Result Value Ref Range   Ammonia <10 9 - 35 umol/L    Comment: Performed at [facility], [address] [place], [place] [address]  TSH     Status: None   Collection Time: 08/06/23  9:18 PM  Result Value Ref Range   TSH 3.718 0.350 - 4.500 uIU/mL    Comment: Performed by a 3rd Generation assay with a functional sensitivity of <=0.01 uIU/mL. Performed at [facility], [address], [place], [place] [address]   Ethanol     Status: None   Collection Time: 08/06/23  9:18 PM  Result Value Ref Range   Alcohol, Ethyl (B) <10 <10 mg/dL    Comment: (NOTE) Lowest detectable limit for serum alcohol is 10 mg/dL.  For medical purposes only. Performed at [facility], [address] [place], [place] [address]   Procalcitonin     Status: None   Collection Time: 08/06/23  9:18 PM  Result Value Ref Range   Procalcitonin 58.91 ng/mL    Comment:        Interpretation: PCT >= 10 ng/mL: Important systemic inflammatory response, almost exclusively due to severe bacterial sepsis or septic shock. (NOTE)       Sepsis PCT Algorithm           Lower Respiratory Tract                                      Infection PCT Algorithm    ----------------------------     ----------------------------         PCT < 0.25 ng/mL                PCT < 0.10 ng/mL          Strongly encourage             Strongly discourage   discontinuation of antibiotics    initiation of antibiotics    ----------------------------     -----------------------------       PCT 0.25 - 0.50 ng/mL            PCT 0.10 - 0.25 ng/mL               OR       >80% decrease in PCT            Discourage initiation of                                            antibiotics      Encourage discontinuation           of antibiotics    ----------------------------      -----------------------------         PCT >= 0.50 ng/mL              PCT 0.26 - 0.50 ng/mL  AND       <80% decrease in PCT             Encourage initiation of                                             antibiotics       Encourage continuation           of antibiotics    ----------------------------     -----------------------------        PCT >= 0.50 ng/mL                  PCT > 0.50 ng/mL               AND         increase in PCT                  Strongly encourage                                      initiation of antibiotics    Strongly encourage escalation           of antibiotics                                     -----------------------------                                           PCT <= 0.25 ng/mL                                                 OR                                        > 80% decrease in PCT                                      Discontinue / Do not initiate                                             antibiotics  Performed at [facility], [address] [place], [place] [address]   Basic metabolic panel     Status: Abnormal   Collection Time: 08/06/23  9:18 PM  Result Value Ref Range   Sodium 135 135 - 145 mmol/L   Potassium 4.2 3.5 - 5.1 mmol/L   Chloride 105 98 - 111 mmol/L   CO2 19 (L) 22 - 32 mmol/L   Glucose, Bld 156 (H) 70 - 99 mg/dL    Comment: Glucose reference range applies only to samples taken after fasting for at least 8 hours.  BUN 45 (H) 8 - 23 mg/dL   Creatinine, Ser 5.62 (H) 0.44 - 1.00 mg/dL   Calcium 7.7 (L) 8.9 - 10.3 mg/dL   GFR, Estimated 15 (L) >60 mL/min    Comment: (NOTE) Calculated using the CKD-EPI Creatinine Equation (2021)    Anion gap 11 5 - 15    Comment: Performed at [facility], [address] [place], [place] [address]  T4, free     Status: Abnormal   Collection Time: 08/06/23  9:18 PM  Result Value Ref Range   Free T4 1.30 (H) 0.61 - 1.12 ng/dL    Comment: (NOTE) Biotin  ingestion may interfere with free T4 tests. If the results are inconsistent with the TSH level, previous test results, or the clinical presentation, then consider biotin interference. If needed, order repeat testing after stopping biotin. Performed at [facility], [address] [place], [place] [address]   Hemoglobin A1c     Status: Abnormal   Collection Time: 08/06/23  9:18 PM  Result Value Ref Range   Hgb A1c MFr Bld 5.9 (H) 4.8 - 5.6 %    Comment: (NOTE) Pre diabetes:          5.7%-6.4%  Diabetes:              >6.4%  Glycemic control for   <7.0% adults with diabetes    Mean Plasma Glucose 122.63 mg/dL    Comment: Performed at [facility], [address] [place], [place] [address]  Magnesium     Status: Abnormal   Collection Time: 08/06/23  9:18 PM  Result Value Ref Range   Magnesium 1.0 (L) 1.7 - 2.4 mg/dL    Comment: Performed at [facility], [address] [place], [place] [address]  Glucose, capillary     Status: Abnormal   Collection Time: 08/06/23 11:27 PM  Result Value Ref Range   Glucose-Capillary 121 (H) 70 - 99 mg/dL    Comment: Glucose reference range applies only to samples taken after fasting for at least 8 hours.  Glucose, capillary     Status: Abnormal   Collection Time: 08/07/23  4:02 AM  Result Value Ref Range   Glucose-Capillary 144 (H) 70 - 99 mg/dL    Comment: Glucose reference range applies only to samples taken after fasting for at least 8 hours.  Glucose, capillary     Status: Abnormal   Collection Time: 08/07/23  8:12 AM  Result Value Ref Range   Glucose-Capillary 158 (H) 70 - 99 mg/dL    Comment: Glucose reference range applies only to samples taken after fasting for at least 8 hours.  Glucose, capillary     Status: Abnormal   Collection Time: 08/07/23 11:12 AM  Result Value Ref Range   Glucose-Capillary 147 (H) 70 - 99 mg/dL    Comment: Glucose reference range applies only to samples taken after fasting for at least 8  hours.  CBC     Status: Abnormal   Collection Time: 08/07/23 12:11 PM  Result Value Ref Range   WBC 7.1 4.0 - 10.5 K/uL   RBC 3.45 (L) 3.87 - 5.11 MIL/uL   Hemoglobin 10.3 (L) 12.0 - 15.0 g/dL   HCT 24.4 (L) 01.0 - 27.2 %   MCV 85.8 80.0 - 100.0 fL   MCH 29.9 26.0 - 34.0 pg   MCHC 34.8 30.0 - 36.0 g/dL   RDW 53.6 64.4 - 03.4 %   Platelets 47 (L)  150 - 400 K/uL    Comment: Immature Platelet Fraction may be clinically indicated, consider ordering this additional test [id number] REPEATED TO VERIFY PLATELET COUNT CONFIRMED BY SMEAR    nRBC 0.0 0.0 - 0.2 %    Comment: Performed at [facility], [address], [place], [place] [address]  Basic metabolic panel     Status: Abnormal   Collection Time: 08/07/23 12:11 PM  Result Value Ref Range   Sodium 133 (L) 135 - 145 mmol/L   Potassium 3.7 3.5 - 5.1 mmol/L   Chloride 104 98 - 111 mmol/L   CO2 18 (L) 22 - 32 mmol/L   Glucose, Bld 148 (H) 70 - 99 mg/dL    Comment: Glucose reference range applies only to samples taken after fasting for at least 8 hours.   BUN 48 (H) 8 - 23 mg/dL   Creatinine, Ser 8.65 (H) 0.44 - 1.00 mg/dL   Calcium 8.0 (L) 8.9 - 10.3 mg/dL   GFR, Estimated 14 (L) >60 mL/min    Comment: (NOTE) Calculated using the CKD-EPI Creatinine Equation (2021)    Anion gap 11 5 - 15    Comment: Performed at [facility], [address], [place], [place] [address]  Phosphorus     Status: None   Collection Time: 08/07/23 12:11 PM  Result Value Ref Range   Phosphorus 2.8 2.5 - 4.6 mg/dL    Comment: Performed at [facility], [address] [place], [place] [address]  Magnesium     Status: Abnormal   Collection Time: 08/07/23 12:11 PM  Result Value Ref Range   Magnesium 3.0 (H) 1.7 - 2.4 mg/dL    Comment: Performed at [facility], [address], [place], [place] [address]  Hepatic function panel     Status: Abnormal   Collection Time: 08/07/23 12:11 PM  Result Value Ref Range   Total Protein 4.9  (L) 6.5 - 8.1 g/dL   Albumin 2.2 (L) 3.5 - 5.0 g/dL   AST 56 (H) 15 - 41 U/L   ALT 127 (H) 0 - 44 U/L   Alkaline Phosphatase 133 (H) 38 - 126 U/L   Total Bilirubin 2.1 (H) <1.2 mg/dL   Bilirubin, Direct 0.9 (H) 0.0 - 0.2 mg/dL   Indirect Bilirubin 1.2 (H) 0.3 - 0.9 mg/dL    Comment: Performed at [facility], [address] [place], [place] [address]  Lipase, blood     Status: None   Collection Time: 08/07/23 12:11 PM  Result Value Ref Range   Lipase 32 11 - 51 U/L    Comment: Performed at [facility], [address], [place], [place] [address]  Glucose, capillary     Status: Abnormal   Collection Time: 08/07/23  3:35 PM  Result Value Ref Range   Glucose-Capillary 243 (H) 70 - 99 mg/dL    Comment: Glucose reference range applies only to samples taken after fasting for at least 8 hours.    ECHOCARDIOGRAM COMPLETE  Result Date: 08/07/2023    ECHOCARDIOGRAM REPORT   Patient Name:   KARISMA MEISER Date of Exam: 08/07/2023 Medical Rec #:  [id number]           Height:       63.0 in Accession #:    [id number]          Weight:       173.9 lb Date of Birth:  19-Aug-1946  BSA:          1.822 m Patient Age:    76 years            BP:           125/62 mmHg Patient Gender: F                   HR:           96 bpm. Exam Location:  Inpatient Procedure: 2D Echo, Cardiac Doppler and Color Doppler Indications:    Stroke I63.9  History:        Patient has prior history of Echocardiogram examinations, most                 recent 01/12/2021. Signs/Symptoms:Hypotension; Risk                 Factors:Hypertension, Diabetes and Dyslipidemia. CKD, stage 3.  Sonographer:    Lucendia Herrlich RCS Referring Phys: [id number] OMAR M ALBUSTAMI IMPRESSIONS  1. Left ventricular ejection fraction, by estimation, is 60 to 65%. The left ventricle has normal function. The left ventricle has no regional wall motion abnormalities. Left ventricular diastolic parameters are indeterminate.  2. Right ventricular  systolic function is normal. The right ventricular size is mildly enlarged. There is mildly elevated pulmonary artery systolic pressure. The estimated right ventricular systolic pressure is 38.0 mmHg.  3. Left atrial size was moderately dilated.  4. The mitral valve is degenerative. Trivial mitral valve regurgitation. No evidence of mitral stenosis. Severe mitral annular calcification.  5. The aortic valve is abnormal. There is mild calcification of the aortic valve. Aortic valve regurgitation is not visualized. Aortic valve sclerosis/calcification is present, without any evidence of aortic stenosis.  6. The inferior vena cava is normal in size with greater than 50% respiratory variability, suggesting right atrial pressure of 3 mmHg. Conclusion(s)/Recommendation(s): No intracardiac source of embolism detected on this transthoracic study. Consider a transesophageal echocardiogram to exclude cardiac source of embolism if clinically indicated. FINDINGS  Left Ventricle: Left ventricular ejection fraction, by estimation, is 60 to 65%. The left ventricle has normal function. The left ventricle has no regional wall motion abnormalities. The left ventricular internal cavity size was normal in size. There is  no left ventricular hypertrophy. Left ventricular diastolic parameters are indeterminate. Right Ventricle: The right ventricular size is mildly enlarged. No increase in right ventricular wall thickness. Right ventricular systolic function is normal. There is mildly elevated pulmonary artery systolic pressure. The tricuspid regurgitant velocity is 2.96 m/s, and with an assumed right atrial pressure of 3 mmHg, the estimated right ventricular systolic pressure is 38.0 mmHg. Left Atrium: Left atrial size was moderately dilated. Right Atrium: Right atrial size was normal in size. Pericardium: There is no evidence of pericardial effusion. Presence of epicardial fat layer. Mitral Valve: The mitral valve is degenerative in  appearance. Severe mitral annular calcification. Trivial mitral valve regurgitation. No evidence of mitral valve stenosis. The mean mitral valve gradient is 2.4 mmHg. Tricuspid Valve: The tricuspid valve is normal in structure. Tricuspid valve regurgitation is mild . No evidence of tricuspid stenosis. Aortic Valve: The aortic valve is abnormal. There is mild calcification of the aortic valve. Aortic valve regurgitation is not visualized. Aortic valve sclerosis/calcification is present, without any evidence of aortic stenosis. Aortic valve mean gradient measures 6.8 mmHg. Aortic valve peak gradient measures 11.6 mmHg. Aortic valve area, by VTI measures 2.01 cm. Pulmonic Valve: The pulmonic valve was normal in structure. Pulmonic valve regurgitation is  mild. No evidence of pulmonic stenosis. Aorta: The aortic root is normal in size and structure. Venous: The inferior vena cava is normal in size with greater than 50% respiratory variability, suggesting right atrial pressure of 3 mmHg. IAS/Shunts: The interatrial septum was not well visualized.  LEFT VENTRICLE PLAX 2D LVIDd:         3.80 cm   Diastology LVIDs:         2.40 cm   LV e' medial:    8.70 cm/s LV PW:         0.80 cm   LV E/e' medial:  10.5 LV IVS:        0.70 cm   LV e' lateral:   10.30 cm/s LVOT diam:     2.00 cm   LV E/e' lateral: 8.9 LV SV:         55 LV SV Index:   30 LVOT Area:     3.14 cm  RIGHT VENTRICLE             IVC RV S prime:     20.00 cm/s  IVC diam: 2.00 cm TAPSE (M-mode): 1.7 cm LEFT ATRIUM             Index        RIGHT ATRIUM           Index LA diam:        3.00 cm 1.65 cm/m   RA Area:     13.50 cm LA Vol (A2C):   29.4 ml 16.13 ml/m  RA Volume:   31.10 ml  17.07 ml/m LA Vol (A4C):   46.2 ml 25.35 ml/m LA Biplane Vol: 40.8 ml 22.39 ml/m  AORTIC VALVE AV Area (Vmax):    2.16 cm AV Area (Vmean):   2.00 cm AV Area (VTI):     2.01 cm AV Vmax:           170.16 cm/s AV Vmean:          123.268 cm/s AV VTI:            0.273 m AV Peak Grad:       11.6 mmHg AV Mean Grad:      6.8 mmHg LVOT Vmax:         116.75 cm/s LVOT Vmean:        78.325 cm/s LVOT VTI:          0.175 m LVOT/AV VTI ratio: 0.64  AORTA Ao Root diam: 3.10 cm Ao Asc diam:  3.10 cm MITRAL VALVE                TRICUSPID VALVE MV Area (PHT): 4.17 cm     TR Peak grad:   35.0 mmHg MV Mean grad:  2.4 mmHg     TR Vmax:        296.00 cm/s MV Decel Time: 182 msec MV E velocity: 91.50 cm/s   SHUNTS MV A velocity: 106.00 cm/s  Systemic VTI:  0.17 m MV E/A ratio:  0.86         Systemic Diam: 2.00 cm Weston Brass MD Electronically signed by Weston Brass MD Signature Date/Time: 08/07/2023/1:46:27 PM    Final    MR BRAIN WO CONTRAST  Result Date: 08/06/2023 CLINICAL DATA:  Acute neurologic deficit EXAM: MRI HEAD WITHOUT CONTRAST TECHNIQUE: Multiplanar, multiecho pulse sequences of the brain and surrounding structures were obtained without intravenous contrast. COMPARISON:  None Available. FINDINGS: Brain: No acute infarct, mass effect or extra-axial collection.  No acute or chronic hemorrhage. There is multifocal hyperintense T2-weighted signal within the white matter. Parenchymal volume and CSF spaces are normal. The midline structures are normal. Vascular: Normal flow voids. Skull and upper cervical spine: Normal calvarium and skull base. Visualized upper cervical spine and soft tissues are normal. Sinuses/Orbits:No paranasal sinus fluid levels or advanced mucosal thickening. No mastoid or middle ear effusion. Normal orbits. Ocular lens replacements. IMPRESSION: 1. No acute intracranial abnormality. 2. Findings of chronic small vessel ischemia. Electronically Signed   By: Deatra Robinson M.D.   On: 08/06/2023 21:35   CT HEAD CODE STROKE WO CONTRAST`  Result Date: 08/06/2023 CLINICAL DATA:  Code stroke. Altered mental status. Aphasia and facial droop. EXAM: CT HEAD WITHOUT CONTRAST TECHNIQUE: Contiguous axial images were obtained from the base of the skull through the vertex without  intravenous contrast. RADIATION DOSE REDUCTION: This exam was performed according to the departmental dose-optimization program which includes automated exposure control, adjustment of the mA and/or kV according to patient size and/or use of iterative reconstruction technique. COMPARISON:  None Available. FINDINGS: Brain: No acute intracranial hemorrhage. Gray-white differentiation is preserved. No hydrocephalus or extra-axial collection. No mass effect or midline shift. Vascular: No hyperdense vessel or unexpected calcification. Skull: No calvarial fracture or suspicious bone lesion. Skull base is unremarkable. Sinuses/Orbits: No acute finding. Other: None. ASPECTS ([place] Stroke Program Early CT Score) - Ganglionic level infarction (caudate, lentiform nuclei, internal capsule, insula, M1-M3 cortex): 7 - Supraganglionic infarction (M4-M6 cortex): 3 Total score (0-10 with 10 being normal): 10 IMPRESSION: No acute intracranial hemorrhage or evidence of acute large vessel territory infarct. ASPECT score is 10. Code stroke imaging results were communicated on 08/06/2023 at 8:37 pm to provider Dr. Derry Lory via secure text paging. Electronically Signed   By: Orvan Falconer M.D.   On: 08/06/2023 20:41   CT ABDOMEN PELVIS WO CONTRAST  Result Date: 08/06/2023 CLINICAL DATA:  Diarrhea hypotension, diarrhea, sepsis. EXAM: CT ABDOMEN AND PELVIS WITHOUT CONTRAST TECHNIQUE: Multidetector CT imaging of the abdomen and pelvis was performed following the standard protocol without IV contrast. RADIATION DOSE REDUCTION: This exam was performed according to the departmental dose-optimization program which includes automated exposure control, adjustment of the mA and/or kV according to patient size and/or use of iterative reconstruction technique. COMPARISON:  CT scan abdomen and pelvis from 01/12/2021. FINDINGS: Lower chest: There are patchy atelectatic changes in the visualized lung bases. No overt consolidation. No pleural  effusion. The heart is normal in size. No pericardial effusion. Hepatobiliary: The liver is normal in size. Non-cirrhotic configuration. Surgically absent gallbladder. There is small amount of air in the extrahepatic bile duct and right intrahepatic bile duct, likely from prior sphincterotomy. Redemonstration of thick/hyperattenuating wall of the left main hepatic duct which was better evaluated on the recent MRI. Pancreas: Unremarkable. No pancreatic ductal dilatation or surrounding inflammatory changes. Spleen: Within normal limits. No focal lesion. Adrenals/Urinary Tract: Adrenal glands are unremarkable. No suspicious renal mass. There is an ill-defined approximately 1 cm sized hypoattenuating focus in the left kidney interpolar region, laterally, which corresponds to a simple cyst on prior MRI. No hydronephrosis. No renal or ureteric calculi. Unremarkable urinary bladder. Stomach/Bowel: There is a moderate sized sliding hiatal hernia. There are multiple diverticula arising from the duodenal as well as jejunum. There is medium length segment of colon (approximately 10 cm) at the level of junction of descending colon and proximal sigmoid colon, exhibiting mild irregular wall thickening and pericolonic fat stranding on the background of several diverticula, compatible  with acute uncomplicated diverticulitis. No walled off abscess or loculated collection. No pneumoperitoneum. No disproportionate dilation of small or large bowel loops to suggest bowel obstruction. The appendix was not visualized; however there is no acute inflammatory process in the right lower quadrant. Vascular/Lymphatic: No ascites or pneumoperitoneum. No abdominal or pelvic lymphadenopathy, by size criteria. No aneurysmal dilation of the major abdominal arteries. There are mild peripheral atherosclerotic vascular calcifications of the aorta and its major branches. Reproductive: The uterus is surgically absent. No large adnexal mass. Other: There  is a tiny fat containing umbilical hernia. The soft tissues and abdominal wall are otherwise unremarkable. Musculoskeletal: No suspicious osseous lesions. There are mild multilevel degenerative changes in the visualized spine. L4-5 posterior spinal fixation noted. IMPRESSION: *Findings favor acute uncomplicated diverticulitis at the junction of descending colon and proximal sigmoid colon. *Redemonstration of thick/hyperattenuating wall of the left main hepatic duct better evaluated on recent MRI abdomen. Please refer to MRI abdomen report for details. *Multiple other nonacute observations, as described above. Electronically Signed   By: Jules Schick M.D.   On: 08/06/2023 16:13   DG Chest Port 1 View  Result Date: 08/06/2023 CLINICAL DATA:  Questionable sepsis - evaluate for abnormality. Diarrhea. Nausea. EXAM: PORTABLE CHEST 1 VIEW COMPARISON:  01/11/2021. FINDINGS: Bilateral lung fields are clear. Bilateral costophrenic angles are clear. Normal cardio-mediastinal silhouette. No acute osseous abnormalities. The soft tissues are within normal limits. IMPRESSION: *No active disease. Electronically Signed   By: Jules Schick M.D.   On: 08/06/2023 15:59    ROS currently she is very lethargic but does open her eyes and acknowledges my presence and her husband says she is doing better Blood pressure 126/60, pulse 95, temperature (!) 100.7 F (38.2 C), temperature source Oral, resp. rate (!) 23, height 5\' 3"  (1.6 m), weight 78.9 kg, SpO2 92%. Physical Exam vital signs stable low-grade fever currently abdomen is actually soft nontender blood pressure little better than it was CT reviewed MRI reviewed again significantly increased BUN and creatinine liver tests decreased some INR 1.4 white count decreased from 22-7.1 hemoglobin dropped a little with hydration platelet count decreased Assessment/Plan: Sepsis growing positive cultures Plan: When stable will discuss repeat ERCP and attempts at tissue sample of  her ducts however it is hard to know whether she has cholangitis with white count and liver tests decreasing fairly quickly with slight elevation in bilirubin more than 50% in direct and would await infectious disease opinion on where the sepsis came from be at the bile ducts or diverticulitis and will check on tomorrow  [facility] E 08/07/2023, 5:00 PM

## 2023-08-08 ENCOUNTER — Inpatient Hospital Stay (HOSPITAL_COMMUNITY): Payer: PPO

## 2023-08-08 DIAGNOSIS — N179 Acute kidney failure, unspecified: Secondary | ICD-10-CM | POA: Diagnosis not present

## 2023-08-08 DIAGNOSIS — R6521 Severe sepsis with septic shock: Secondary | ICD-10-CM | POA: Diagnosis not present

## 2023-08-08 DIAGNOSIS — A419 Sepsis, unspecified organism: Secondary | ICD-10-CM | POA: Diagnosis not present

## 2023-08-08 LAB — DIC (DISSEMINATED INTRAVASCULAR COAGULATION)PANEL
D-Dimer, Quant: 5.52 ug{FEU}/mL — ABNORMAL HIGH (ref 0.00–0.50)
Fibrinogen: >800 mg/dL — ABNORMAL HIGH (ref 210–475)
INR: 1.3 — ABNORMAL HIGH (ref 0.8–1.2)
Platelets: 28 10*3/uL — CL (ref 150–400)
Prothrombin Time: 16 s — ABNORMAL HIGH (ref 11.4–15.2)
Smear Review: NONE SEEN
aPTT: 33 s (ref 24–36)

## 2023-08-08 LAB — GLUCOSE, CAPILLARY
Glucose-Capillary: 115 mg/dL — ABNORMAL HIGH (ref 70–99)
Glucose-Capillary: 143 mg/dL — ABNORMAL HIGH (ref 70–99)
Glucose-Capillary: 146 mg/dL — ABNORMAL HIGH (ref 70–99)
Glucose-Capillary: 169 mg/dL — ABNORMAL HIGH (ref 70–99)
Glucose-Capillary: 174 mg/dL — ABNORMAL HIGH (ref 70–99)
Glucose-Capillary: 151 mg/dL — ABNORMAL HIGH (ref 70–99)

## 2023-08-08 LAB — CBC
HCT: 29.1 % — ABNORMAL LOW (ref 36.0–46.0)
Hemoglobin: 9.9 g/dL — ABNORMAL LOW (ref 12.0–15.0)
MCH: 29.2 pg (ref 26.0–34.0)
MCHC: 34 g/dL (ref 30.0–36.0)
MCV: 85.8 fL (ref 80.0–100.0)
Platelets: 34 10*3/uL — ABNORMAL LOW (ref 150–400)
RBC: 3.39 MIL/uL — ABNORMAL LOW (ref 3.87–5.11)
RDW: 13.9 % (ref 11.5–15.5)
WBC: 8.4 10*3/uL (ref 4.0–10.5)
nRBC: 0 % (ref 0.0–0.2)

## 2023-08-08 LAB — BASIC METABOLIC PANEL
Anion gap: 11 (ref 5–15)
BUN: 48 mg/dL — ABNORMAL HIGH (ref 8–23)
CO2: 20 mmol/L — ABNORMAL LOW (ref 22–32)
Calcium: 7.6 mg/dL — ABNORMAL LOW (ref 8.9–10.3)
Chloride: 103 mmol/L (ref 98–111)
Creatinine, Ser: 3.65 mg/dL — ABNORMAL HIGH (ref 0.44–1.00)
GFR, Estimated: 12 mL/min — ABNORMAL LOW (ref >60)
Glucose, Bld: 131 mg/dL — ABNORMAL HIGH (ref 70–99)
Potassium: 3.7 mmol/L (ref 3.5–5.1)
Sodium: 134 mmol/L — ABNORMAL LOW (ref 135–145)

## 2023-08-08 LAB — HEPATIC FUNCTION PANEL
ALT: 98 U/L — ABNORMAL HIGH (ref 0–44)
AST: 35 U/L (ref 15–41)
Albumin: 2.1 g/dL — ABNORMAL LOW (ref 3.5–5.0)
Alkaline Phosphatase: 133 U/L — ABNORMAL HIGH (ref 38–126)
Bilirubin, Direct: 1.4 mg/dL — ABNORMAL HIGH (ref 0.0–0.2)
Indirect Bilirubin: 1.5 mg/dL — ABNORMAL HIGH (ref 0.3–0.9)
Total Bilirubin: 2.9 mg/dL — ABNORMAL HIGH (ref <1.2)
Total Protein: 5.1 g/dL — ABNORMAL LOW (ref 6.5–8.1)

## 2023-08-08 LAB — MAGNESIUM: Magnesium: 2.9 mg/dL — ABNORMAL HIGH (ref 1.7–2.4)

## 2023-08-08 LAB — PHOSPHORUS: Phosphorus: 4 mg/dL (ref 2.5–4.6)

## 2023-08-08 MED ORDER — ORAL CARE MOUTH RINSE: 15.0000 mL | OROMUCOSAL | Status: DC | PRN

## 2023-08-08 MED ORDER — MEPERIDINE HCL 25 MG/ML IJ SOLN
12.5000 mg | Freq: Once | INTRAMUSCULAR | Status: AC
  Administered 2023-08-08: 12.5 mg via INTRAVENOUS
  Filled 2023-08-08: qty 1

## 2023-08-08 MED ORDER — AMIODARONE HCL IN DEXTROSE 360-4.14 MG/200ML-% IV SOLN
60.0000 mg/h | INTRAVENOUS | Status: AC
  Administered 2023-08-08 (×2): 60 mg/h via INTRAVENOUS

## 2023-08-08 MED ORDER — AMIODARONE LOAD VIA INFUSION
150.0000 mg | Freq: Once | INTRAVENOUS | Status: AC
  Administered 2023-08-08: 150 mg via INTRAVENOUS
  Filled 2023-08-08: qty 83.34

## 2023-08-08 MED ORDER — POTASSIUM CHLORIDE 20 MEQ PO PACK
40.0000 meq | PACK | Freq: Once | ORAL | Status: DC
  Filled 2023-08-08: qty 2

## 2023-08-08 MED ORDER — SODIUM CHLORIDE 0.9 % IV SOLN
2.0000 g | INTRAVENOUS | Status: AC
  Administered 2023-08-08 – 2023-08-17 (×10): 2 g via INTRAVENOUS
  Filled 2023-08-08 (×10): qty 20

## 2023-08-08 MED ORDER — ORAL CARE MOUTH RINSE
15.0000 mL | OROMUCOSAL | Status: DC
  Administered 2023-08-08 – 2023-08-19 (×39): 15 mL via OROMUCOSAL

## 2023-08-08 MED ORDER — AMIODARONE HCL IN DEXTROSE 360-4.14 MG/200ML-% IV SOLN
30.0000 mg/h | INTRAVENOUS | Status: DC
  Administered 2023-08-08 – 2023-08-09 (×3): 30 mg/h via INTRAVENOUS
  Filled 2023-08-08 (×4): qty 200

## 2023-08-08 MED ORDER — POTASSIUM CHLORIDE 20 MEQ PO PACK
40.0000 meq | PACK | Freq: Once | ORAL | Status: AC
  Administered 2023-08-08: 40 meq via ORAL

## 2023-08-08 NOTE — Progress Notes (Signed)
Wasted 0.82mL of Demerol with Darlis Loan, RN.

## 2023-08-08 NOTE — Progress Notes (Addendum)
NAME:  Tina Briggs, MRN:  [id number], DOB:  1946/05/09, LOS: 2 ADMISSION DATE:  08/06/2023, CONSULTATION DATE:  08/06/2023 REFERRING MD:  MCDB, CHIEF COMPLAINT:  Hypotension   History of Present Illness:  Tina Briggs is a 77 y.o. female who has a PMH as below including but not limited to HTN, HLD, DM, IBS, anxiety, depression. He presented to [facility] 11/6 with diarrhea, nausea, confusion x 3 days. Was apparently hypotensive since the day earlier. Diarrhea was water like, non bloody. No recent abx use or exposure to known persons with C.diff. BP soft initially but did respond to fluids; however, lactate climbed. CT abd/pelv was done and suggestive of sigmoid diverticulitis. He was started on Zosyn with continued IVF's.   Due to climbing lactate, PCCM called for transfer to [facility] ICU.  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 Mild to mod TI scarring. (Dr. Eliott Nine)   HNP (herniated nucleus pulposus), lumbar    Recurrent   HYPERLIPIDEMIA-MIXED    HYPERTENSION, UNSPECIFIED    Hypertensive retinopathy    Injury of right hip    Lumbar back pain    Macular degeneration    Microscopic hematuria    Neuropathy    OBESITY    Plantar fasciitis    PONV (postoperative nausea and vomiting)    past history only, "sometimes they put a patch on me"   Right eye injury   \  Significant Hospital Events: Including procedures, antibiotic start and stop dates in addition to other pertinent events   11/6 admitted to ICU with septic vs  hypovolemic shock and uncomplicated diverticulitis  Interim History / Subjective:  Still feels tired but is not having as much abdominal pain and doing well taking PO liquids.  Objective   Blood pressure (!) 93/55, pulse 82, temperature (!) 97.4 F (36.3 C), temperature source Oral, resp. rate 18, height 5\' 3"  (1.6 m), weight 79.1 kg, SpO2 100%.        Intake/Output Summary (Last 24 hours) at 08/08/2023 0619 Last data filed at 08/08/2023 0000 Gross per 24 hour  Intake 1691.34 ml  Output 1500 ml  Net 191.34 ml   Filed Weights   08/07/23 0500 08/08/23 0122  Weight: 78.9 kg 79.1 kg    Examination: Constitutional:tired appearing elderly female. In no acute distress. HENT: Normocephalic, atraumatic,  Eyes: Sclera non-icteric, PERRL, EOM intact Cardio:Regular rate and rhythm. 2+ bilateral radial and dorsalis pedis  pulses. Pulm:Clear to auscultation bilaterally. Normal work of breathing on room air. Abdomen: Soft, epigastric tendernes, no rebound, non-distended, positive bowel sounds. WUJ:[id number] for extremity edema. Skin:Warm and dry. Neuro:Alert and oriented x3. No focal deficit noted.  Resolved Hospital Problem list   Acute encephalopathy  Assessment & Plan:  Sepsis with septic/hypovolemic shock 2/2 E. Coli bacteremia Acute Uncomplicated Sigmoid diverticulitis with severe diarrhea - has chronic recurrent diverticulitis. C. 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 Dr. Ewing Schlein saw the patient yesterday. They will discuss ERCP when she is more stable.   AKI on CKD likely 3b - suspect hypovolemia, stable with good UOP Hyponatremia - improved with IVF - oral hydration - Follow BMP  Thrombocytopenia Prior labs available show baseline plts in  2022 of 90-130. Plts 90 on admission but down to 39 today. DIC panel consistent with sepsis induced coagulopathy.  - continue to monitor   Afib w/ RVR Overnight afib with RVR likely in the setting sepsis, started on amiodarone gtt. Will transition to oral tomorrow. Holding anticoagulation for now. Continue telemetry -amiodarone gtt 30 mg/hour  Hx HTN, HLD. - Hold PTA Losartan, Lopressor.   Hx DM.- A1c 5.9 Hypoglycemia - Hold home meds - hold ssi w/ hypoglycemia - cbg monitoring   Transaminitis and hyperbilirubinemia  - possibly low flow state from hypovolemia; though, does have known biliary disease on recent MRI. Improving but stable direct hyperbilirubinemia  - Continue fluids. - Hold PTA Crestor, Acetaminophen. - Trend LFT's.   Hx Anxiety, Depression. - Hold PTA Venlafaxine, Gabapentin. -resume when less fatigued  Best Practice (right click and "Reselect all SmartList Selections" daily)   Diet/type: clear liquids DVT prophylaxis: LMWH GI prophylaxis: H2B Lines: N/A Foley:  N/A Code Status:  full code Last date of multidisciplinary goals of care discussion [08/08/2023]  Labs   CBC: Recent Labs  Lab 08/06/23 1310 08/06/23 2012 08/07/23 1211 08/08/23 0259  WBC 22.4*  --  7.1 8.4  NEUTROABS 19.2*  --   --   --   HGB 12.2 9.5* 10.3* 9.9*  HCT 36.1 28.0* 29.6* 29.1*  MCV 88.3  --  85.8 85.8  PLT 91*  --  47* 34*    Basic Metabolic Panel: Recent Labs  Lab 08/06/23 1310 08/06/23 2012 08/06/23 2118 08/07/23 1211 08/08/23 0259  NA 129* 133* 135 133* 134*  K 4.8 3.6 4.2 3.7 3.7  CL 98  --  105 104 103  CO2 17*  --  19* 18* 20*  GLUCOSE 190*  --  156* 148* 131*  BUN 47*  --  45* 48* 48*  CREATININE 3.51*  --  3.12* 3.25* 3.65*  CALCIUM 8.7*  --  7.7* 8.0* 7.6*  MG  --   --  1.0* 3.0* 2.9*  PHOS  --   --   --  2.8 4.0   GFR: Estimated Creatinine Clearance: 13.1 mL/min (A) (by C-G formula based on SCr of 3.65 mg/dL (H)). Recent Labs  Lab 08/06/23 1310  08/06/23 1424 08/06/23 1823 08/06/23 2118 08/07/23 1211 08/08/23 0259  PROCALCITON  --   --   --  58.91  --   --   WBC 22.4*  --   --   --  7.1 8.4  LATICACIDVEN 4.6* 6.8* 3.7*  --   --   --     Liver Function Tests: Recent Labs  Lab 08/06/23 1310 08/07/23 1211 08/08/23 0259  AST 142* 56* 35  ALT 230* 127* 98*  ALKPHOS 80 133* 133*  BILITOT 1.6* 2.1* 2.9*  PROT 6.5 4.9* 5.1*  ALBUMIN 3.7 2.2* 2.1*   Recent Labs  Lab 08/07/23 1211  LIPASE 32   Recent Labs  Lab 08/06/23 2118  AMMONIA <10    ABG    Component Value Date/Time  PHART 7.389 08/06/2023 2012   PCO2ART 22.7 (L) 08/06/2023 2012   PO2ART 68 (L) 08/06/2023 2012   HCO3 13.3 (L) 08/06/2023 2012   TCO2 14 (L) 08/06/2023 2012   ACIDBASEDEF 9.0 (H) 08/06/2023 2012   O2SAT 90 08/06/2023 2012     Coagulation Profile: Recent Labs  Lab 08/06/23 1310  INR 1.4*    Cardiac Enzymes: No results for input(s): "CKTOTAL", "CKMB", "CKMBINDEX", "TROPONINI" in the last 168 hours.  HbA1C: Hgb A1c MFr Bld  Date/Time Value Ref Range Status  08/06/2023 09:18 PM 5.9 (H) 4.8 - 5.6 % Final    Comment:    (NOTE) Pre diabetes:          5.7%-6.4%  Diabetes:              >6.4%  Glycemic control for   <7.0% adults with diabetes   01/13/2021 02:54 AM 6.7 (H) 4.8 - 5.6 % Final    Comment:    (NOTE)         Prediabetes: 5.7 - 6.4         Diabetes: >6.4         Glycemic control for adults with diabetes: <7.0     CBG: Recent Labs  Lab 08/07/23 1112 08/07/23 1535 08/07/23 1922 08/07/23 2314 08/08/23 0323  GLUCAP 147* 243* 180* 163* 115*    Review of Systems:   Epigastric abdominal pain, fatigue.  Past Medical History:  She,  has a past medical history of Allergic rhinitis, Anemia, Anxiety, Aspiration pneumonia (HCC), CATARACT, RIGHT EYE, Colon polyps, Depression, Diverticulitis, DM, Dyslipidemia, Elevated alkaline phosphatase level, GERD, GERD (gastroesophageal reflux disease), Glomerulonephritis  (11/18/2017), HNP (herniated nucleus pulposus), lumbar, HYPERLIPIDEMIA-MIXED, HYPERTENSION, UNSPECIFIED, Hypertensive retinopathy, Injury of right hip, Lumbar back pain, Macular degeneration, Microscopic hematuria, Neuropathy, OBESITY, Plantar fasciitis, PONV (postoperative nausea and vomiting), and Right eye injury.   Surgical History:   Past Surgical History:  Procedure Laterality Date   ABDOMINAL HYSTERECTOMY     APPENDECTOMY     BACK SURGERY     lumbar '09"microdiscectomy"   BREAST BIOPSY     BREAST BIOPSY Right 05/19/2023   MM RT BREAST BX W LOC DEV EA AD LESION IMG BX SPEC STEREO GUIDE 05/19/2023 [facility]   BREAST BIOPSY Right 05/19/2023   MM RT BREAST BX W LOC DEV 1ST LESION IMAGE BX SPEC STEREO GUIDE 05/19/2023 [facility]   breat Bx benign     CHOLECYSTECTOMY     open   COLONOSCOPY WITH PROPOFOL N/A 11/22/2014   Procedure: COLONOSCOPY WITH PROPOFOL;  Surgeon: Charolett Bumpers, MD;  Location: [facility];  Service: Endoscopy;  Laterality: N/A;   ERCP N/A 05/26/2015   Procedure: ENDOSCOPIC RETROGRADE CHOLANGIOPANCREATOGRAPHY (ERCP)   (DOING CASE IN MAIN OR);  Surgeon: Dorena Cookey, MD;  Location: [facility];  Service: Gastroenterology;  Laterality: N/A;   ERCP N/A 06/06/2015   Procedure: ENDOSCOPIC RETROGRADE CHOLANGIOPANCREATOGRAPHY (ERCP);  Surgeon: Vida Rigger, MD;  Location: [facility];  Service: Endoscopy;  Laterality: N/A;   ERCP N/A 01/12/2021   Procedure: ENDOSCOPIC RETROGRADE CHOLANGIOPANCREATOGRAPHY (ERCP);  Surgeon: Vida Rigger, MD;  Location: [facility];  Service: Endoscopy;  Laterality: N/A;   ESOPHAGOGASTRODUODENOSCOPY (EGD) WITH PROPOFOL N/A 11/22/2014   Procedure: ESOPHAGOGASTRODUODENOSCOPY (EGD) WITH PROPOFOL;  Surgeon: Charolett Bumpers, MD;  Location: [facility];  Service: Endoscopy;  Laterality: N/A;   EYE SURGERY Bilateral    cataracts   GANGLION CYST EXCISION Right    LIPOMA EXCISION  1993   From thigh 1993  LUMBAR FUSION  09/16/2018    LUMBAR LAMINECTOMY/DECOMPRESSION MICRODISCECTOMY Left 12/31/2017   Procedure: Laminectomy and Foraminotomy - Lumbar Three-Four - left Extraforaminal diskectomy Lumbar Four-Five left;  Surgeon: Tia Alert, MD;  Location: [facility] OR;  Service: Neurosurgery;  Laterality: Left;   PERONEAL NERVE DECOMPRESSION Left 12/31/2017   Procedure: Left PERONEAL NERVE DECOMPRESSION;  Surgeon: Tia Alert, MD;  Location: [facility] OR;  Service: Neurosurgery;  Laterality: Left;  left   PERONEAL NERVE DECOMPRESSION     REMOVAL OF STONES  01/12/2021   Procedure: REMOVAL OF STONES;  Surgeon: Vida Rigger, MD;  Location: [facility];  Service: Endoscopy;;   Right ganglion cyst     Right ganglion cyst remove     TUBAL LIGATION       Social History:   reports that she has never smoked. She has never used smokeless tobacco. She reports that she does not drink alcohol and does not use drugs.   Family History:  Her family history includes Alzheimer's disease in her father; Cancer in her maternal aunt, maternal grandmother, and paternal grandmother; Diabetes Mellitus II in her brother; Healthy in her son and son; Heart disease in her mother; Tremor in her father.   Allergies Allergies  Allergen Reactions   Codeine Shortness Of Breath   Clavulanic Acid     Pt tolerated Zosyn 11/6   Flagyl [Metronidazole]    Other Other (See Comments)   Prevacid [Lansoprazole]    Tetracyclines & Related Itching   Trulicity [Dulaglutide]    Azithromycin Itching and Rash   Erythromycin Itching and Rash   Morphine And Codeine Itching and Rash   Septra [Sulfamethoxazole-Trimethoprim] Itching and Rash     Home Medications  Prior to Admission medications   Medication Sig Start Date End Date Taking? Authorizing Provider  acetaminophen (TYLENOL) 500 MG tablet Take 1,000 mg by mouth 2 (two) times daily as needed for moderate pain or headache.    Yes [provider]  cholecalciferol (VITAMIN D) 1000 units tablet Take 5,000 Units  by mouth daily.   Yes [provider]  colestipol (COLESTID) 1 g tablet Take 2 tablets (2 g total) by mouth in the morning and at bedtime. Patient taking differently: Take 2 g by mouth in the morning and at bedtime. *usually takes 1 qam and 2 qpm* 08/21/22  Yes Celso Amy, PA-C  CRESTOR 40 MG tablet TAKE 1 TABLET AT BEDTIME Patient taking differently: Take 40 mg by mouth at bedtime. 04/19/13  Yes Wall, Jesse Sans, MD  dapagliflozin propanediol (FARXIGA) 5 MG TABS tablet Take 5 mg by mouth daily.   Yes [provider]  desloratadine (CLARINEX) 5 MG tablet Take 1 tablet (5 mg total) by mouth daily. 11/21/22  Yes [name], MD  diclofenac Sodium (VOLTAREN) 1 % GEL Apply 4 g topically 4 (four) times daily. 11/03/21  Yes Melene Plan, DO  fluticasone (FLONASE) 50 MCG/ACT nasal spray Place 2 sprays into both nostrils daily as needed for allergies.    Yes [provider]  gabapentin (NEURONTIN) 300 MG capsule Take 300-600 mg by mouth See admin instructions. 300mg  in the morning 600mg  at night 10/26/18  Yes [provider]  losartan (COZAAR) 50 MG tablet Take 1 tablet (50 mg total) by mouth 2 (two) times daily. 05/09/23  Yes   Multiple Vitamins-Minerals (PRESERVISION AREDS 2 PO) Take 1 tablet by mouth 2 (two) times daily.   Yes [provider]  NOVOLOG FLEXPEN RELION 100 UNIT/ML FlexPen Inject 8-10 Units into  the skin 3 (three) times daily as needed. BS 12/05/20  Yes [provider]  rosuvastatin (CRESTOR) 40 MG tablet Take 1 tablet (40 mg total) by mouth at bedtime. 08/15/22  Yes [name], MD  Semaglutide,0.25 or 0.5MG /DOS, (OZEMPIC, 0.25 OR 0.5 MG/DOSE,) 2 MG/3ML SOPN Inject 0.5 mg into the skin once a week. 04/01/23  Yes   venlafaxine XR (EFFEXOR-XR) 75 MG 24 hr capsule Take 75 mg by mouth every morning.    Yes [provider]  vitamin B-12 (CYANOCOBALAMIN) 1000 MCG tablet Take 1,000 mcg by mouth 3 (three) times a week. Take on Sundays,  Wednesdays and Fridays.   Yes [provider]  cephALEXin (KEFLEX) 500 MG capsule Take 1 capsule (500 mg total) by mouth 3 (three) times daily. Patient not taking: Reported on 08/06/2023 05/14/23   Lenn Sink, DPM  FREESTYLE LITE test strip USE 1 STRIP TO CHECK GLUCOSE 4 TIMES DAILY 12/04/20   [provider]  methocarbamol (ROBAXIN) 500 MG tablet Take 1 tablet (500 mg total) by mouth 2 (two) times daily. Patient not taking: Reported on 08/06/2023 11/03/21   Melene Plan, DO  methylPREDNISolone (MEDROL DOSEPAK) 4 MG TBPK tablet Day 1: 8mg  before breakfast, 4 mg after lunch, 4 mg after supper, and 8 mg at bedtime Day 2: 4 mg before breakfast, 4 mg after lunch, 4 mg  after supper, and 8 mg  at bedtime Day 3:  4 mg  before breakfast, 4 mg  after lunch, 4 mg after supper, and 4 mg  at bedtime Day 4: 4 mg  before breakfast, 4 mg  after lunch, and 4 mg at bedtime Day 5: 4 mg  before breakfast and 4 mg at bedtime Day 6: 4 mg  before breakfast Patient not taking: Reported on 08/06/2023 11/03/21   Melene Plan, DO  metoprolol tartrate (LOPRESSOR) 100 MG tablet Take one tablet by mouth 2 hours prior to your CT Patient not taking: Reported on 08/06/2023 05/08/21   Lyn Records, MD     Critical care time: 28    Rocky Morel, DO Internal Medicine Resident, PGY-2 Pager# [phone] [facility] 08/08/2023 6:19 AM  For contact information, see Amion. If no response to pager, please call PCCM consult pager. After hours, 7PM- 7AM, please call Elink.

## 2023-08-08 NOTE — Progress Notes (Addendum)
eLink Physician-Brief Progress Note Patient Name: Tina Briggs DOB: May 26, 1946 MRN: [id number]   Date of Service  08/08/2023  HPI/Events of Note  Septic shock -differential includes diverticulitis and cholangitis, GNR bacteremia, await ID , continue Zosyn in the meantime.   Febrile to 101.3 that has broken but now she is Rigoring.  In A-fib/RVR without a history of A-fib.   eICU Interventions  Meperidine x 1 for rigors.  EKG pending but no immediate intervention indicated. Holding home metoprolol    0511 -potassium supplementation 0621 -  Last EKG with sinus tachycardia.  Telemetry monitor consistent with A-fib RVR up to the 150s.  Associated hypoxemia.  Initiate amiodarone bolus and infusion.  Concurrent norepinephrine use  Intervention Category Minor Interventions: Routine modifications to care plan (e.g. PRN medications for pain, fever)  Tina Briggs 08/08/2023, 1:55 AM

## 2023-08-08 NOTE — Progress Notes (Signed)
[place] Form 3:29 PM  Subjective: Patient more awake today but still not happy Case discussed with her family and her main problem is just feeling hot but she also has some abdominal pain pretty much all over she says she had a bowel movement and has been urinating and drinking some liquids and has no new complaints and case discussed with her nurse as well  Objective: Vital signs better no acute distress abdomen is soft mildly tender throughout without any rebound tenderness BUN about the same creatinine a little worse LFTs little better except for bilirubin which again is 50% indirect white count okay hemoglobin stable platelet count decreased  Assessment: Sepsis syndrome questionable from diverticuli think liver test abnormality probably more from shock liver than cholangitis  Plan: Per ICU team and will ask rounding partner to check on this weekend still would plan ERCP when stable and better and less we think she has cholangitis and needs a sooner and consider asking infectious disease for their opinion based on the bacteria in her blood of both E. coli and Enterobacter  Tina Briggs E  office [phone] After 5PM or if no answer call [phone]

## 2023-08-08 NOTE — Plan of Care (Signed)
  Problem: Education: Goal: Knowledge of General Education information will improve Description: Including pain rating scale, medication(s)/side effects and non-pharmacologic comfort measures Outcome: Progressing   Problem: Elimination: Goal: Will not experience complications related to bowel motility Outcome: Progressing Goal: Will not experience complications related to urinary retention Outcome: Progressing   Problem: Skin Integrity: Goal: Risk for impaired skin integrity will decrease Outcome: Progressing   Problem: Clinical Measurements: Goal: Will remain free from infection Outcome: Not Progressing   Problem: Nutrition: Goal: Adequate nutrition will be maintained Outcome: Not Progressing   Problem: Coping: Goal: Level of anxiety will decrease Outcome: Not Progressing   Problem: Pain Management: Goal: General experience of comfort will improve Outcome: Not Progressing

## 2023-08-08 NOTE — Plan of Care (Signed)

## 2023-08-09 ENCOUNTER — Inpatient Hospital Stay (HOSPITAL_COMMUNITY): Payer: PPO

## 2023-08-09 DIAGNOSIS — R6521 Severe sepsis with septic shock: Secondary | ICD-10-CM | POA: Diagnosis not present

## 2023-08-09 DIAGNOSIS — N179 Acute kidney failure, unspecified: Secondary | ICD-10-CM | POA: Diagnosis not present

## 2023-08-09 DIAGNOSIS — A419 Sepsis, unspecified organism: Secondary | ICD-10-CM | POA: Diagnosis not present

## 2023-08-09 LAB — CORTISOL: Cortisol, Plasma: 17.2 ug/dL

## 2023-08-09 LAB — BASIC METABOLIC PANEL
Anion gap: 9 (ref 5–15)
BUN: 48 mg/dL — ABNORMAL HIGH (ref 8–23)
CO2: 20 mmol/L — ABNORMAL LOW (ref 22–32)
Calcium: 7.6 mg/dL — ABNORMAL LOW (ref 8.9–10.3)
Chloride: 103 mmol/L (ref 98–111)
Creatinine, Ser: 3.35 mg/dL — ABNORMAL HIGH (ref 0.44–1.00)
GFR, Estimated: 14 mL/min — ABNORMAL LOW (ref >60)
Glucose, Bld: 142 mg/dL — ABNORMAL HIGH (ref 70–99)
Potassium: 3.4 mmol/L — ABNORMAL LOW (ref 3.5–5.1)
Sodium: 132 mmol/L — ABNORMAL LOW (ref 135–145)

## 2023-08-09 LAB — DIC (DISSEMINATED INTRAVASCULAR COAGULATION)PANEL
D-Dimer, Quant: 5.03 ug{FEU}/mL — ABNORMAL HIGH (ref 0.00–0.50)
Fibrinogen: 790 mg/dL — ABNORMAL HIGH (ref 210–475)
INR: 1.2 (ref 0.8–1.2)
Platelets: 28 10*3/uL — CL (ref 150–400)
Prothrombin Time: 15.2 s (ref 11.4–15.2)
Smear Review: NONE SEEN
aPTT: 29 s (ref 24–36)

## 2023-08-09 LAB — CBC
HCT: 26.2 % — ABNORMAL LOW (ref 36.0–46.0)
Hemoglobin: 9 g/dL — ABNORMAL LOW (ref 12.0–15.0)
MCH: 29.4 pg (ref 26.0–34.0)
MCHC: 34.4 g/dL (ref 30.0–36.0)
MCV: 85.6 fL (ref 80.0–100.0)
Platelets: 28 10*3/uL — CL (ref 150–400)
RBC: 3.06 MIL/uL — ABNORMAL LOW (ref 3.87–5.11)
RDW: 14.3 % (ref 11.5–15.5)
WBC: 7.1 10*3/uL (ref 4.0–10.5)
nRBC: 0 % (ref 0.0–0.2)

## 2023-08-09 LAB — GLUCOSE, CAPILLARY
Glucose-Capillary: 110 mg/dL — ABNORMAL HIGH (ref 70–99)
Glucose-Capillary: 140 mg/dL — ABNORMAL HIGH (ref 70–99)
Glucose-Capillary: 112 mg/dL — ABNORMAL HIGH (ref 70–99)
Glucose-Capillary: 81 mg/dL (ref 70–99)
Glucose-Capillary: 84 mg/dL (ref 70–99)
Glucose-Capillary: 105 mg/dL — ABNORMAL HIGH (ref 70–99)
Glucose-Capillary: 236 mg/dL — ABNORMAL HIGH (ref 70–99)
Glucose-Capillary: 228 mg/dL — ABNORMAL HIGH (ref 70–99)

## 2023-08-09 MED ORDER — SODIUM CHLORIDE 0.9 % IV SOLN
250.0000 mL | INTRAVENOUS | Status: DC
  Administered 2023-08-09: 250 mL via INTRAVENOUS

## 2023-08-09 MED ORDER — GLUCOSE 40 % PO GEL
1.0000 | Freq: Once | ORAL | Status: AC
  Administered 2023-08-09: 31 g via ORAL

## 2023-08-09 MED ORDER — LACTATED RINGERS IV BOLUS
500.0000 mL | Freq: Once | INTRAVENOUS | Status: AC
  Administered 2023-08-09: 500 mL via INTRAVENOUS

## 2023-08-09 MED ORDER — GABAPENTIN 100 MG PO CAPS
100.0000 mg | ORAL_CAPSULE | Freq: Two times a day (BID) | ORAL | Status: DC
  Administered 2023-08-09 – 2023-08-10 (×4): 100 mg via ORAL
  Filled 2023-08-09 (×5): qty 1

## 2023-08-09 MED ORDER — ROSUVASTATIN CALCIUM 20 MG PO TABS
40.0000 mg | ORAL_TABLET | Freq: Every day | ORAL | Status: DC
  Administered 2023-08-09 – 2023-08-18 (×10): 40 mg via ORAL
  Filled 2023-08-09 (×10): qty 2

## 2023-08-09 MED ORDER — ACETAMINOPHEN 650 MG RE SUPP
650.0000 mg | Freq: Once | RECTAL | Status: AC
  Administered 2023-08-09: 650 mg via RECTAL
  Filled 2023-08-09: qty 1

## 2023-08-09 MED ORDER — LACTATED RINGERS IV BOLUS: 500.0000 mL | Freq: Once | INTRAVENOUS | Status: DC

## 2023-08-09 MED ORDER — HYDROCORTISONE SOD SUC (PF) 100 MG IJ SOLR
100.0000 mg | Freq: Two times a day (BID) | INTRAMUSCULAR | Status: DC
  Administered 2023-08-09 – 2023-08-11 (×4): 100 mg via INTRAVENOUS
  Filled 2023-08-09 (×4): qty 2

## 2023-08-09 MED ORDER — NOREPINEPHRINE 4 MG/250ML-% IV SOLN
2.0000 ug/min | INTRAVENOUS | Status: DC
  Administered 2023-08-09 – 2023-08-11 (×2): 2 ug/min via INTRAVENOUS
  Filled 2023-08-09 (×2): qty 250

## 2023-08-09 MED ORDER — LACTATED RINGERS IV BOLUS: 500.0000 mL | Freq: Once | INTRAVENOUS | Status: AC

## 2023-08-09 MED ORDER — ONDANSETRON 4 MG PO TBDP
4.0000 mg | ORAL_TABLET | Freq: Three times a day (TID) | ORAL | Status: DC | PRN
  Administered 2023-08-10 – 2023-08-12 (×3): 4 mg via ORAL
  Filled 2023-08-09 (×3): qty 1

## 2023-08-09 MED ORDER — GLUCOSE 40 % PO GEL
ORAL | Status: AC
  Filled 2023-08-09: qty 1.21

## 2023-08-09 MED ORDER — VENLAFAXINE HCL ER 75 MG PO CP24
75.0000 mg | ORAL_CAPSULE | Freq: Every day | ORAL | Status: DC
  Administered 2023-08-10: 75 mg via ORAL
  Filled 2023-08-09 (×2): qty 1

## 2023-08-09 MED ORDER — POTASSIUM CHLORIDE 10 MEQ/100ML IV SOLN
10.0000 meq | INTRAVENOUS | Status: AC
  Administered 2023-08-09 (×2): 10 meq via INTRAVENOUS
  Filled 2023-08-09 (×2): qty 100

## 2023-08-09 NOTE — Plan of Care (Signed)
  Problem: Education: Goal: Knowledge of General Education information will improve Description: Including pain rating scale, medication(s)/side effects and non-pharmacologic comfort measures Outcome: Progressing   Problem: Elimination: Goal: Will not experience complications related to urinary retention Outcome: Progressing   Problem: Pain Management: Goal: General experience of comfort will improve Outcome: Progressing   Problem: Safety: Goal: Ability to remain free from injury will improve Outcome: Progressing

## 2023-08-09 NOTE — Evaluation (Signed)
Occupational Therapy Evaluation Patient Details Name: Tina Briggs MRN: [id number] DOB: 1946/03/01 Today's Date: 08/09/2023   History of Present Illness 77 y/o woman presented 11/6 with abdominal pain, nausea, and confusion. She was found to have septic shock and bacteremia. PMHx: HTN, HLD, DM, IBS, anxiety, depression.   Clinical Impression   PTA, pt lives with spouse and typically completely Independent in all daily tasks without AD. Pt presents now with deficits in strength, dynamic standing balance and endurance. Pt limited by shivering throughout session but agreeable to OT eval. Overall, pt able to mobilize with RW and chair follow w/ CGA-Min A. Pt requires Min A for UB ADL and up to Mod A for LB ADLs d/t deficits. Anticipate with continued OOB activity that pt will progress in order to return home with HHOT.  BP pre activity: 88/65 BP post activity: 121/64       If plan is discharge home, recommend the following: A little help with walking and/or transfers;A little help with bathing/dressing/bathroom;Assistance with cooking/housework    Functional Status Assessment  Patient has had a recent decline in their functional status and demonstrates the ability to make significant improvements in function in a reasonable and predictable amount of time.  Equipment Recommendations  BSC/3in1    Recommendations for Other Services       Precautions / Restrictions Precautions Precautions: Fall Precaution Comments: monitor BP Restrictions Weight Bearing Restrictions: No      Mobility Bed Mobility               General bed mobility comments: in chair on entry    Transfers Overall transfer level: Needs assistance Equipment used: Rolling walker (2 wheels) Transfers: Sit to/from Stand Sit to Stand: Contact guard assist, Min assist           General transfer comment: CGA initially to stand from chair. Min A to stand from recliner at end of session likely due to  fatigue      Balance Overall balance assessment: Needs assistance Sitting-balance support: No upper extremity supported, Feet supported Sitting balance-Leahy Scale: Fair     Standing balance support: Bilateral upper extremity supported Standing balance-Leahy Scale: Poor                             ADL either performed or assessed with clinical judgement   ADL Overall ADL's : Needs assistance/impaired Eating/Feeding: Set up;Sitting   Grooming: Set up;Sitting   Upper Body Bathing: Minimal assistance;Sitting   Lower Body Bathing: Moderate assistance;Sit to/from stand;Sitting/lateral leans   Upper Body Dressing : Minimal assistance;Sitting   Lower Body Dressing: Moderate assistance;Sitting/lateral leans;Sit to/from stand   Toilet Transfer: Minimal assistance;Stand-pivot;BSC/3in1;Rolling walker (2 wheels)   Toileting- Clothing Manipulation and Hygiene: Minimal assistance;Sitting/lateral lean;Sit to/from stand       Functional mobility during ADLs: Minimal assistance;+2 for safety/equipment;Contact guard assist General ADL Comments: Able to attempt ambulation in room w/ +2 for lines/chair follow due to pt quick fatigue. Anticipate as endurance improves that pt will make significant gains with ADLs     Vision Baseline Vision/History: 1 Wears glasses Ability to See in Adequate Light: 0 Adequate Patient Visual Report: No change from baseline Vision Assessment?: No apparent visual deficits     Perception         Praxis         Pertinent Vitals/Pain Pain Assessment Pain Assessment: Faces Faces Pain Scale: Hurts little more Pain Location: back Pain Descriptors / Indicators:  Grimacing, Guarding Pain Intervention(s): Monitored during session, Limited activity within patient's tolerance, Repositioned     Extremity/Trunk Assessment Upper Extremity Assessment Upper Extremity Assessment: Generalized weakness;Right hand dominant (tremulous (due to being cold))    Lower Extremity Assessment Lower Extremity Assessment: Defer to PT evaluation   Cervical / Trunk Assessment Cervical / Trunk Assessment: Normal   Communication Communication Communication: No apparent difficulties   Cognition Arousal: Alert Behavior During Therapy: WFL for tasks assessed/performed Overall Cognitive Status: Within Functional Limits for tasks assessed                                       General Comments  At rest: BP97/54 (MAP 67) HR 61, SpO2 93% on 2L, RR 20.  Following transfer to recliner: BP 89/59, HR 62, 98% SpO2 on RA. O2 reapplied end of session. Denied dizziness throughout.    Exercises     Shoulder Instructions      Home Living Family/patient expects to be discharged to:: Private residence Living Arrangements: Spouse/significant other Available Help at Discharge: Family Type of Home: Other(Comment) (townhouse) Home Access: Level entry     Home Layout: One level     Bathroom Shower/Tub: Producer, television/film/video: Standard Bathroom Accessibility: Yes   Home Equipment: Grab bars - toilet;Grab bars - tub/shower;Rolling Walker (2 wheels)          Prior Functioning/Environment Prior Level of Function : Independent/Modified Independent;Driving             Mobility Comments: no AD ADLs Comments: plays piano at church, gardens some. typically completely independent        OT Problem List: Decreased strength;Decreased activity tolerance;Impaired balance (sitting and/or standing);Decreased knowledge of use of DME or AE      OT Treatment/Interventions: Self-care/ADL training;Therapeutic exercise;Energy conservation;DME and/or AE instruction;Therapeutic activities;Patient/family education;Balance training    OT Goals(Current goals can be found in the care plan section) Acute Rehab OT Goals Patient Stated Goal: stop shivering OT Goal Formulation: With patient Time For Goal Achievement: 08/23/23 Potential to Achieve  Goals: Good ADL Goals Pt Will Perform Lower Body Dressing: with modified independence;sitting/lateral leans;sit to/from stand Pt Will Transfer to Toilet: with modified independence;ambulating Pt/caregiver will Perform Home Exercise Program: Increased strength;Both right and left upper extremity;Independently;With theraband;With written HEP provided Additional ADL Goal #1: Pt to increase standing tolerance > 10 min without rest break during ADLs/functional mobility  OT Frequency: Min 1X/week    Co-evaluation              AM-PAC OT "6 Clicks" Daily Activity     Outcome Measure Help from another person eating meals?: A Little Help from another person taking care of personal grooming?: A Little Help from another person toileting, which includes using toliet, bedpan, or urinal?: A Little Help from another person bathing (including washing, rinsing, drying)?: A Lot Help from another person to put on and taking off regular upper body clothing?: A Little Help from another person to put on and taking off regular lower body clothing?: A Lot 6 Click Score: 16   End of Session Equipment Utilized During Treatment: Gait belt;Rolling walker (2 wheels);Oxygen Nurse Communication: Mobility status  Activity Tolerance: Patient tolerated treatment well Patient left: in chair;with call bell/phone within reach;with chair alarm set  OT Visit Diagnosis: Unsteadiness on feet (R26.81);Other abnormalities of gait and mobility (R26.89);Muscle weakness (generalized) (M62.81)  Time: 4782-9562 OT Time Calculation (min): 35 min Charges:  OT General Charges $OT Visit: 1 Visit OT Evaluation $OT Eval Moderate Complexity: 1 Mod OT Treatments $Therapeutic Activity: 8-22 mins  Bradd Canary, OTR/L Acute Rehab Services Office: [phone]   Lorre Munroe 08/09/2023, 12:55 PM

## 2023-08-09 NOTE — Progress Notes (Addendum)
NAME:  Tina Briggs, MRN:  [id number], DOB:  [birth year], LOS: 3 ADMISSION DATE:  08/06/2023, CONSULTATION DATE:  08/06/2023 REFERRING MD:  MCDB, CHIEF COMPLAINT:  Hypotension   History of Present Illness:  Tina Briggs is a 77 y.o. female who has a PMH as below including but not limited to HTN, HLD, DM, IBS, anxiety, depression. He presented to [facility] 11/6 with diarrhea, nausea, confusion x 3 days. Was apparently hypotensive since the day earlier. Diarrhea was water like, non bloody. No recent abx use or exposure to known persons with C.diff. BP soft initially but did respond to fluids; however, lactate climbed. CT abd/pelv was done and suggestive of sigmoid diverticulitis. He was started on Zosyn with continued IVF's.   Due to climbing lactate, PCCM called for transfer to [facility] ICU.  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 Mild to mod TI scarring. (Dr. Eliott Nine)   HNP (herniated nucleus pulposus), lumbar    Recurrent   HYPERLIPIDEMIA-MIXED    HYPERTENSION, UNSPECIFIED    Hypertensive retinopathy    Injury of right hip    Lumbar back pain    Macular degeneration    Microscopic hematuria    Neuropathy    OBESITY    Plantar fasciitis    PONV (postoperative nausea and vomiting)    past history only, "sometimes they put a patch on me"   Right eye injury     Significant Hospital Events: Including procedures, antibiotic start and stop dates in addition to other pertinent events   11/6 admitted to ICU with septic vs  hypovolemic shock and uncomplicated diverticulitis 11/8 started on amiodarone for afib overnight and in the afternoon was off pressors for 5-6 hours 11/9 off pressors at 7 am  Interim History / Subjective:  Still feels tired but is improved from yesterday. Continues to have epigastric pain but is not having recurrent diarrhea.   Objective   Blood pressure (!) 103/52, pulse 79, temperature (!) 100.4 F (38 C), temperature source Oral, resp. rate (!) 23, height 5\' 3"  (1.6 m), weight 79.3 kg, SpO2 100%.        Intake/Output Summary (Last 24 hours) at 08/09/2023 0652 Last data filed at 08/09/2023 0400 Gross per 24 hour  Intake 1699.18 ml  Output 750 ml  Net 949.18 ml   Filed Weights   08/07/23 0500 08/08/23 0122 08/09/23 0351  Weight: 78.9 kg 79.1 kg 79.3 kg    Examination: Constitutional:tired appearing elderly female. In no acute distress. HENT: Normocephalic, atraumatic,  Eyes: Sclera non-icteric, PERRL, EOM intact Cardio:Regular rate and rhythm. 2+ bilateral radial and dorsalis pedis  pulses. Pulm:Clear to auscultation bilaterally. Normal work of breathing on room air. Abdomen: Soft, epigastric tendernes, no rebound, non-distended, positive bowel sounds. XHB:ZJIRCVEL for extremity edema. Skin:Warm and dry. Neuro:Alert and oriented x3. No focal deficit noted.  Resolved Hospital Problem list   Acute encephalopathy  Assessment & Plan:  Sepsis with septic/hypovolemic shock  2/2 E. Coli bacteremia Acute Uncomplicated Sigmoid diverticulitis with severe diarrhea - has chronic recurrent diverticulitis. C. Diff negative - ceftriaxone 11/8 (zosyn 11/6-11/8) - full liquid diet. Advance as tolerated - Continue levo with MAP goal >65, if off for over 6 hours we can transfer out of ICU this afternoon  Intrahepatic biliary duct dilation  Seen on MRCP recently with concern for [place] type cholangiocarcinoma. Dr. Ewing Schlein saw the patient in ICU. They will discuss ERCP when she is more  stable.   AKI on CKD likely 3b - suspect hypovolemia, stable with good UOP Hyponatremia - improved with IVF - oral hydration - Follow BMP  Thrombocytopenia Prior labs available show baseline plts in 2022 of 90-130. Plts 90 on admission but down to 38. DIC panel consistent with sepsis induced coagulopathy.  Improving today with plts 28. - continue to monitor   Afib w/ RVR Overnight afib with RVR likely in the setting sepsis, started on amiodarone gtt. Will transition to oral tomorrow. Holding anticoagulation for now. Continue telemetry -transition from amio drip to oral amiodarone 400 mg BID for 22 doses to complete 10 g load then daily  -cardiology f/u for ?monitor and discontinuation of amiodarone   Hx HTN, HLD. - Hold PTA Losartan, Lopressor.   Hx DM.- A1c 5.9 Hypoglycemia - Hold home meds - hold ssi w/ hypoglycemia - cbg monitoring   Transaminitis and hyperbilirubinemia  - possibly low flow state from hypovolemia; though, does have known biliary disease on recent MRI. Improving but stable direct hyperbilirubinemia  - resume statin - Trend LFT's.   Hx Anxiety, Depression. - resume PTA Venlafaxine, Gabapentin (decreased dose for AKI)  Best Practice (right click and "Reselect all SmartList Selections" daily)   Diet/type: clear liquids DVT prophylaxis: SCD GI prophylaxis: H2B Lines: N/A Foley:  N/A Code Status:  full code Last date of multidisciplinary goals of care discussion [08/09/2023]  Labs   CBC: Recent Labs  Lab 08/06/23 1310 08/06/23 2012 08/07/23 1211 08/08/23 0259 08/08/23 1113  WBC 22.4*  --  7.1 8.4  --   NEUTROABS 19.2*  --   --   --   --   HGB 12.2 9.5* 10.3* 9.9*  --   HCT 36.1 28.0* 29.6* 29.1*  --   MCV 88.3  --  85.8 85.8  --   PLT 91*  --  47* 34* 28*    Basic Metabolic Panel: Recent Labs  Lab 08/06/23 1310 08/06/23 2012 08/06/23 2118 08/07/23 1211 08/08/23 0259  NA 129* 133* 135 133* 134*  K 4.8 3.6 4.2 3.7 3.7  CL 98  --  105 104  103  CO2 17*  --  19* 18* 20*  GLUCOSE 190*  --  156* 148* 131*  BUN 47*  --  45* 48* 48*  CREATININE 3.51*  --  3.12* 3.25* 3.65*  CALCIUM 8.7*  --  7.7* 8.0* 7.6*  MG  --   --  1.0* 3.0* 2.9*  PHOS  --   --   --  2.8 4.0   GFR: Estimated Creatinine Clearance: 13.1 mL/min (A) (by C-G formula based on SCr of 3.65 mg/dL (H)). Recent Labs  Lab 08/06/23 1310 08/06/23 1424 08/06/23 1823 08/06/23 2118 08/07/23 1211 08/08/23 0259  PROCALCITON  --   --   --  58.91  --   --   WBC 22.4*  --   --   --  7.1 8.4  LATICACIDVEN 4.6* 6.8* 3.7*  --   --   --  Liver Function Tests: Recent Labs  Lab 08/06/23 1310 08/07/23 1211 08/08/23 0259  AST 142* 56* 35  ALT 230* 127* 98*  ALKPHOS 80 133* 133*  BILITOT 1.6* 2.1* 2.9*  PROT 6.5 4.9* 5.1*  ALBUMIN 3.7 2.2* 2.1*   Recent Labs  Lab 08/07/23 1211  LIPASE 32   Recent Labs  Lab 08/06/23 2118  AMMONIA <10    ABG    Component Value Date/Time   PHART 7.389 08/06/2023 2012   PCO2ART 22.7 (L) 08/06/2023 2012   PO2ART 68 (L) 08/06/2023 2012   HCO3 13.3 (L) 08/06/2023 2012   TCO2 14 (L) 08/06/2023 2012   ACIDBASEDEF 9.0 (H) 08/06/2023 2012   O2SAT 90 08/06/2023 2012     Coagulation Profile: Recent Labs  Lab 08/06/23 1310 08/08/23 1113  INR 1.4* 1.3*    Cardiac Enzymes: No results for input(s): "CKTOTAL", "CKMB", "CKMBINDEX", "TROPONINI" in the last 168 hours.  HbA1C: Hgb A1c MFr Bld  Date/Time Value Ref Range Status  08/06/2023 09:18 PM 5.9 (H) 4.8 - 5.6 % Final    Comment:    (NOTE) Pre diabetes:          5.7%-6.4%  Diabetes:              >6.4%  Glycemic control for   <7.0% adults with diabetes   01/13/2021 02:54 AM 6.7 (H) 4.8 - 5.6 % Final    Comment:    (NOTE)         Prediabetes: 5.7 - 6.4         Diabetes: >6.4         Glycemic control for adults with diabetes: <7.0     CBG: Recent Labs  Lab 08/08/23 1135 08/08/23 1512 08/08/23 1916 08/08/23 2304 08/09/23 0331  GLUCAP 146* 169* 174*  151* 110*    Review of Systems:   Epigastric abdominal pain, fatigue.  Past Medical History:  She,  has a past medical history of Allergic rhinitis, Anemia, Anxiety, Aspiration pneumonia (HCC), CATARACT, RIGHT EYE, Colon polyps, Depression, Diverticulitis, DM, Dyslipidemia, Elevated alkaline phosphatase level, GERD, GERD (gastroesophageal reflux disease), Glomerulonephritis (11/18/2017), HNP (herniated nucleus pulposus), lumbar, HYPERLIPIDEMIA-MIXED, HYPERTENSION, UNSPECIFIED, Hypertensive retinopathy, Injury of right hip, Lumbar back pain, Macular degeneration, Microscopic hematuria, Neuropathy, OBESITY, Plantar fasciitis, PONV (postoperative nausea and vomiting), and Right eye injury.   Surgical History:   Past Surgical History:  Procedure Laterality Date   ABDOMINAL HYSTERECTOMY     APPENDECTOMY     BACK SURGERY     lumbar '09"microdiscectomy"   BREAST BIOPSY     BREAST BIOPSY Right 05/19/2023   MM RT BREAST BX W LOC DEV EA AD LESION IMG BX SPEC STEREO GUIDE 05/19/2023 [facility]   BREAST BIOPSY Right 05/19/2023   MM RT BREAST BX W LOC DEV 1ST LESION IMAGE BX SPEC STEREO GUIDE 05/19/2023 [facility]   breat Bx benign     CHOLECYSTECTOMY     open   COLONOSCOPY WITH PROPOFOL N/A 11/22/2014   Procedure: COLONOSCOPY WITH PROPOFOL;  Surgeon: Charolett Bumpers, MD;  Location: [facility];  Service: Endoscopy;  Laterality: N/A;   ERCP N/A 05/26/2015   Procedure: ENDOSCOPIC RETROGRADE CHOLANGIOPANCREATOGRAPHY (ERCP)   (DOING CASE IN MAIN OR);  Surgeon: Dorena Cookey, MD;  Location: [facility];  Service: Gastroenterology;  Laterality: N/A;   ERCP N/A 06/06/2015   Procedure: ENDOSCOPIC RETROGRADE CHOLANGIOPANCREATOGRAPHY (ERCP);  Surgeon: Vida Rigger, MD;  Location: [facility];  Service: Endoscopy;  Laterality: N/A;   ERCP N/A 01/12/2021  Procedure: ENDOSCOPIC RETROGRADE CHOLANGIOPANCREATOGRAPHY (ERCP);  Surgeon: Vida Rigger, MD;  Location: [facility];  Service: Endoscopy;   Laterality: N/A;   ESOPHAGOGASTRODUODENOSCOPY (EGD) WITH PROPOFOL N/A 11/22/2014   Procedure: ESOPHAGOGASTRODUODENOSCOPY (EGD) WITH PROPOFOL;  Surgeon: Charolett Bumpers, MD;  Location: [facility];  Service: Endoscopy;  Laterality: N/A;   EYE SURGERY Bilateral    cataracts   GANGLION CYST EXCISION Right    LIPOMA EXCISION  1993   From thigh 1993   LUMBAR FUSION  09/16/2018   LUMBAR LAMINECTOMY/DECOMPRESSION MICRODISCECTOMY Left 12/31/2017   Procedure: Laminectomy and Foraminotomy - Lumbar Three-Four - left Extraforaminal diskectomy Lumbar Four-Five left;  Surgeon: Tia Alert, MD;  Location: [facility] OR;  Service: Neurosurgery;  Laterality: Left;   PERONEAL NERVE DECOMPRESSION Left 12/31/2017   Procedure: Left PERONEAL NERVE DECOMPRESSION;  Surgeon: Tia Alert, MD;  Location: [facility];  Service: Neurosurgery;  Laterality: Left;  left   PERONEAL NERVE DECOMPRESSION     REMOVAL OF STONES  01/12/2021   Procedure: REMOVAL OF STONES;  Surgeon: Vida Rigger, MD;  Location: [facility];  Service: Endoscopy;;   Right ganglion cyst     Right ganglion cyst remove     TUBAL LIGATION       Social History:   reports that she has never smoked. She has never used smokeless tobacco. She reports that she does not drink alcohol and does not use drugs.   Family History:  Her family history includes Alzheimer's disease in her father; Cancer in her maternal aunt, maternal grandmother, and paternal grandmother; Diabetes Mellitus II in her brother; Healthy in her son and son; Heart disease in her mother; Tremor in her father.   Allergies Allergies  Allergen Reactions   Codeine Shortness Of Breath   Clavulanic Acid     Pt tolerated Zosyn 11/6   Flagyl [Metronidazole]    Other Other (See Comments)   Prevacid [Lansoprazole]    Tetracyclines & Related Itching   Trulicity [Dulaglutide]    Azithromycin Itching and Rash   Erythromycin Itching and Rash   Morphine And Codeine Itching and Rash   Septra  [Sulfamethoxazole-Trimethoprim] Itching and Rash     Home Medications  Prior to Admission medications   Medication Sig Start Date End Date Taking? Authorizing Provider  acetaminophen (TYLENOL) 500 MG tablet Take 1,000 mg by mouth 2 (two) times daily as needed for moderate pain or headache.    Yes [provider]  cholecalciferol (VITAMIN D) 1000 units tablet Take 5,000 Units by mouth daily.   Yes [provider]  colestipol (COLESTID) 1 g tablet Take 2 tablets (2 g total) by mouth in the morning and at bedtime. Patient taking differently: Take 2 g by mouth in the morning and at bedtime. *usually takes 1 qam and 2 qpm* 08/21/22  Yes Celso Amy, PA-C  CRESTOR 40 MG tablet TAKE 1 TABLET AT BEDTIME Patient taking differently: Take 40 mg by mouth at bedtime. 04/19/13  Yes Wall, Jesse Sans, MD  dapagliflozin propanediol (FARXIGA) 5 MG TABS tablet Take 5 mg by mouth daily.   Yes [provider]  desloratadine (CLARINEX) 5 MG tablet Take 1 tablet (5 mg total) by mouth daily. 11/21/22  Yes [name], MD  diclofenac Sodium (VOLTAREN) 1 % GEL Apply 4 g topically 4 (four) times daily. 11/03/21  Yes Melene Plan, DO  fluticasone (FLONASE) 50 MCG/ACT nasal spray Place 2 sprays into both nostrils daily as needed for allergies.    Yes [provider]  gabapentin (  NEURONTIN) 300 MG capsule Take 300-600 mg by mouth See admin instructions. 300mg  in the morning 600mg  at night 10/26/18  Yes [provider]  losartan (COZAAR) 50 MG tablet Take 1 tablet (50 mg total) by mouth 2 (two) times daily. 05/09/23  Yes   Multiple Vitamins-Minerals (PRESERVISION AREDS 2 PO) Take 1 tablet by mouth 2 (two) times daily.   Yes [provider]  NOVOLOG FLEXPEN RELION 100 UNIT/ML FlexPen Inject 8-10 Units into the skin 3 (three) times daily as needed. BS 12/05/20  Yes [provider]  rosuvastatin (CRESTOR) 40 MG tablet Take 1 tablet (40 mg total) by mouth at bedtime. 08/15/22   Yes [name], MD  Semaglutide,0.25 or 0.5MG /DOS, (OZEMPIC, 0.25 OR 0.5 MG/DOSE,) 2 MG/3ML SOPN Inject 0.5 mg into the skin once a week. 04/01/23  Yes   venlafaxine XR (EFFEXOR-XR) 75 MG 24 hr capsule Take 75 mg by mouth every morning.    Yes [provider]  vitamin B-12 (CYANOCOBALAMIN) 1000 MCG tablet Take 1,000 mcg by mouth 3 (three) times a week. Take on Sundays, Wednesdays and Fridays.   Yes [provider]  cephALEXin (KEFLEX) 500 MG capsule Take 1 capsule (500 mg total) by mouth 3 (three) times daily. Patient not taking: Reported on 08/06/2023 05/14/23   Lenn Sink, DPM  FREESTYLE LITE test strip USE 1 STRIP TO CHECK GLUCOSE 4 TIMES DAILY 12/04/20   [provider]  methocarbamol (ROBAXIN) 500 MG tablet Take 1 tablet (500 mg total) by mouth 2 (two) times daily. Patient not taking: Reported on 08/06/2023 11/03/21   Melene Plan, DO  methylPREDNISolone (MEDROL DOSEPAK) 4 MG TBPK tablet Day 1: 8mg  before breakfast, 4 mg after lunch, 4 mg after supper, and 8 mg at bedtime Day 2: 4 mg before breakfast, 4 mg after lunch, 4 mg  after supper, and 8 mg  at bedtime Day 3:  4 mg  before breakfast, 4 mg  after lunch, 4 mg after supper, and 4 mg  at bedtime Day 4: 4 mg  before breakfast, 4 mg  after lunch, and 4 mg at bedtime Day 5: 4 mg  before breakfast and 4 mg at bedtime Day 6: 4 mg  before breakfast Patient not taking: Reported on 08/06/2023 11/03/21   Melene Plan, DO  metoprolol tartrate (LOPRESSOR) 100 MG tablet Take one tablet by mouth 2 hours prior to your CT Patient not taking: Reported on 08/06/2023 05/08/21   Lyn Records, MD     Critical care time: 35    Rocky Morel, DO Internal Medicine Resident, PGY-2 Pager# [phone] [facility] 08/09/2023 6:52 AM  For contact information, see Amion. If no response to pager, please call PCCM consult pager. After hours, 7PM- 7AM, please call Elink.

## 2023-08-09 NOTE — Progress Notes (Addendum)
Sudden hypotension episode again this afternoon, decreased mentation. Shivering, temp axillary >100, oral 99.4. No arrhythmias on tele.   BP (!) 142/71   Pulse 80   Temp 99.4 F (37.4 C) (Oral)   Resp (!) 25   Ht 5\' 3"  (1.6 m)   Wt 79.3 kg   SpO2 96%   BMI 30.97 kg/m  On exam she is shivering, can wake up with stimulation, trying to answer questions but decreased alertness. Symmetric grip strength and able to reposition herself to straighten her legs in the bed. Tracking but not answering many questions.   Husband updated at bedside. Repeat CT abd pelvis- worry about a complication of diverticulitis such as perforation.  CT head since she is thrombocytopenic and altered, despite being nonfocal.  Pressors to maintain MAP >65 Rectal tylenol for fever Reculture blood Added stress dose steroids, although waxing and waning episodes would be an atypical presentation for adrenal insufficiency.   Steffanie Dunn, DO 08/09/23 3:42 PM [facility]  For contact information, see Amion. If no response to pager, please call PCCM consult pager. After hours, 7PM- 7AM, please call Elink.

## 2023-08-09 NOTE — Evaluation (Signed)
Physical Therapy Evaluation Patient Details Name: JUANIKA ZAHM MRN: [id number] DOB: [birth year] Today's Date: 08/09/2023  History of Present Illness  77 y/o woman presented 11/6 with abdominal pain, nausea, and confusion. She was found to have septic shock and bacteremia. PMHx: HTN, HLD, DM, IBS, anxiety, depression.   Clinical Impression  Pt admitted with above diagnosis. 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 Very supportive husband at bedside this morning. Required light min assist to CGA for bed mobility and transferring OOB respectively. Denies dizziness but feels weak. At rest: BP 97/54 (MAP 67) HR 61, SpO2 93% on 2L, RR 20.  Following transfer to recliner: BP 89/59, HR 62, 98% SpO2 on RA. O2 reapplied end of session.  Happy to be out of bed. Reviewed LE exercises to minimize atrophy and secondary complications associated with immobility while hospitalized. Will follow and progress acutely.  Pt currently with functional limitations due to the deficits listed below (see PT Problem List). Pt will benefit from acute skilled PT to increase their independence and safety with mobility to allow discharge.           If plan is discharge home, recommend the following: A little help with walking and/or transfers;A little help with bathing/dressing/bathroom;Assistance with cooking/housework;Assist for transportation   Can travel by private vehicle    yes    Equipment Recommendations None recommended by PT  Recommendations for Other Services       Functional Status Assessment Patient has had a recent decline in their functional status and demonstrates the ability to make significant improvements in function in a reasonable and predictable amount of time.     Precautions / Restrictions Precautions Precautions: Fall Precaution Comments: monitor BP Restrictions Weight Bearing Restrictions: No      Mobility  Bed Mobility Overal bed mobility: Needs Assistance Bed  Mobility: Supine to Sit     Supine to sit: Min assist     General bed mobility comments: Min assist for pt to pull through therapist's hand to rise to EOB. Slow and effortful. Assisted with lines/leads. Cues for technique.    Transfers Overall transfer level: Needs assistance Equipment used: Rolling walker (2 wheels) Transfers: Sit to/from Stand, Bed to chair/wheelchair/BSC Sit to Stand: Contact guard assist   Step pivot transfers: Contact guard assist       General transfer comment: Slow to rise but stable once upright with RW for support. Cues for technique with transitions including hand placement on surface. Step pivot transfer. CGA for safety without evidence of buckling. Denied dizziness with slight drop in BP.    Ambulation/Gait                  Stairs            Wheelchair Mobility     Tilt Bed    Modified Rankin (Stroke Patients Only)       Balance Overall balance assessment: Needs assistance Sitting-balance support: No upper extremity supported, Feet supported Sitting balance-Leahy Scale: Fair     Standing balance support: Bilateral upper extremity supported Standing balance-Leahy Scale: Poor                               Pertinent Vitals/Pain Pain Assessment Pain Assessment: No/denies pain    Home Living Family/patient expects to be discharged to:: Private residence Living Arrangements: Spouse/significant other Available Help at Discharge: Family Type of Home: Other(Comment) (townhouse) Home Access: Level entry  Home Layout: One level Home Equipment: Grab bars - toilet;Grab bars - tub/shower      Prior Function Prior Level of Function : Independent/Modified Independent;Driving             Mobility Comments: ind ADLs Comments: plays piano at church     Extremity/Trunk Assessment   Upper Extremity Assessment Upper Extremity Assessment: Defer to OT evaluation    Lower Extremity Assessment Lower  Extremity Assessment: Generalized weakness       Communication   Communication Communication: No apparent difficulties  Cognition Arousal: Alert Behavior During Therapy: WFL for tasks assessed/performed Overall Cognitive Status: Within Functional Limits for tasks assessed                                          General Comments General comments (skin integrity, edema, etc.): At rest: BP97/54 (MAP 67) HR 61, SpO2 93% on 2L, RR 20.  Following transfer to recliner: BP 89/59, HR 62, 98% SpO2 on RA. O2 reapplied end of session. Denied dizziness throughout.    Exercises General Exercises - Lower Extremity Ankle Circles/Pumps: AROM, Both, 10 reps, Supine Quad Sets: Strengthening, Both, 10 reps, Seated Gluteal Sets: Strengthening, Both, 10 reps, Seated Long Arc Quad: Strengthening, Both, 10 reps, Seated   Assessment/Plan    PT Assessment Patient needs continued PT services  PT Problem List Decreased strength;Decreased activity tolerance;Decreased balance;Decreased mobility;Decreased knowledge of use of DME;Obesity       PT Treatment Interventions DME instruction;Gait training;Stair training;Functional mobility training;Patient/family education;Therapeutic activities;Therapeutic exercise;Balance training;Neuromuscular re-education    PT Goals (Current goals can be found in the Care Plan section)  Acute Rehab PT Goals Patient Stated Goal: 1027 PT Goal Formulation: With patient Time For Goal Achievement: 08/23/23 Potential to Achieve Goals: Good    Frequency Min 1X/week     Co-evaluation               AM-PAC PT "6 Clicks" Mobility  Outcome Measure Help needed turning from your back to your side while in a flat bed without using bedrails?: A Little Help needed moving from lying on your back to sitting on the side of a flat bed without using bedrails?: A Little Help needed moving to and from a bed to a chair (including a wheelchair)?: A Little Help needed  standing up from a chair using your arms (e.g., wheelchair or bedside chair)?: A Little Help needed to walk in hospital room?: A Little Help needed climbing 3-5 steps with a railing? : A Lot 6 Click Score: 17    End of Session Equipment Utilized During Treatment: Gait belt;Oxygen Activity Tolerance: Patient tolerated treatment well Patient left: in chair;with call bell/phone within reach;with chair alarm set;with SCD's reapplied;with family/visitor present Nurse Communication: Mobility status PT Visit Diagnosis: Unsteadiness on feet (R26.81);Muscle weakness (generalized) (M62.81);Difficulty in walking, not elsewhere classified (R26.2)    Time: 7253-6644 PT Time Calculation (min) (ACUTE ONLY): 30 min   Charges:   PT Evaluation $PT Eval Moderate Complexity: 1 Mod PT Treatments $Therapeutic Activity: 8-22 mins PT General Charges $$ ACUTE PT VISIT: 1 Visit         Kathlyn Sacramento, PT, DPT [facility]  Rehabilitation Services Physical Therapist Office: [phone] Website: [email or web address]   Berton Mount 08/09/2023, 12:08 PM

## 2023-08-09 NOTE — Progress Notes (Signed)
[facility] Progress Note  SUBJECTIVE:   Interval history: Tina Briggs was seen and evaluated today at bedside. Resting in chair at bedside, with shaking chills. Noted having both upper and lower abdominal pain. No chest pain or shortness of breath. Had non-bloody bowel movement yesterday per nursing staff.   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 Mild to mod TI scarring. (Dr. Eliott Nine)   HNP (herniated nucleus pulposus), lumbar    Recurrent   HYPERLIPIDEMIA-MIXED    HYPERTENSION, UNSPECIFIED    Hypertensive retinopathy    Injury of right hip    Lumbar back pain    Macular degeneration    Microscopic hematuria    Neuropathy    OBESITY    Plantar fasciitis    PONV (postoperative nausea and vomiting)    past history only, "sometimes they put a patch on me"   Right eye injury    Past Surgical History:  Procedure Laterality Date   ABDOMINAL HYSTERECTOMY     APPENDECTOMY     BACK SURGERY     lumbar '09"microdiscectomy"   BREAST BIOPSY     BREAST BIOPSY Right 05/19/2023   MM RT BREAST BX W LOC DEV EA AD LESION IMG BX SPEC STEREO GUIDE 05/19/2023 [facility]   BREAST BIOPSY Right 05/19/2023   MM RT BREAST BX W LOC DEV 1ST LESION IMAGE BX SPEC STEREO GUIDE 05/19/2023 [facility]   breat Bx benign     CHOLECYSTECTOMY     open   COLONOSCOPY WITH PROPOFOL N/A 11/22/2014   Procedure: COLONOSCOPY WITH PROPOFOL;  Surgeon: Charolett Bumpers, MD;  Location: [facility];  Service: Endoscopy;  Laterality: N/A;   ERCP N/A 05/26/2015    Procedure: ENDOSCOPIC RETROGRADE CHOLANGIOPANCREATOGRAPHY (ERCP)   (DOING CASE IN MAIN OR);  Surgeon: Dorena Cookey, MD;  Location: [facility];  Service: Gastroenterology;  Laterality: N/A;   ERCP N/A 06/06/2015   Procedure: ENDOSCOPIC RETROGRADE CHOLANGIOPANCREATOGRAPHY (ERCP);  Surgeon: Vida Rigger, MD;  Location: [facility];  Service: Endoscopy;  Laterality: N/A;   ERCP N/A 01/12/2021   Procedure: ENDOSCOPIC RETROGRADE CHOLANGIOPANCREATOGRAPHY (ERCP);  Surgeon: Vida Rigger, MD;  Location: [facility];  Service: Endoscopy;  Laterality: N/A;   ESOPHAGOGASTRODUODENOSCOPY (EGD) WITH PROPOFOL N/A 11/22/2014   Procedure: ESOPHAGOGASTRODUODENOSCOPY (EGD) WITH PROPOFOL;  Surgeon: Charolett Bumpers, MD;  Location: [facility];  Service: Endoscopy;  Laterality: N/A;   EYE SURGERY Bilateral    cataracts   GANGLION CYST EXCISION Right    LIPOMA EXCISION  1993   From thigh 1993   LUMBAR FUSION  09/16/2018   LUMBAR LAMINECTOMY/DECOMPRESSION MICRODISCECTOMY Left 12/31/2017   Procedure: Laminectomy and Foraminotomy - Lumbar Three-Four - left Extraforaminal diskectomy Lumbar Four-Five left;  Surgeon: Tia Alert, MD;  Location: [facility] OR;  Service: Neurosurgery;  Laterality: Left;   PERONEAL NERVE DECOMPRESSION Left 12/31/2017   Procedure: Left PERONEAL NERVE DECOMPRESSION;  Surgeon: Tia Alert, MD;  Location: [facility] OR;  Service: Neurosurgery;  Laterality: Left;  left  PERONEAL NERVE DECOMPRESSION     REMOVAL OF STONES  01/12/2021   Procedure: REMOVAL OF STONES;  Surgeon: Vida Rigger, MD;  Location: [facility];  Service: Endoscopy;;   Right ganglion cyst     Right ganglion cyst remove     TUBAL LIGATION     Current Facility-Administered Medications  Medication Dose Route Frequency Provider Last Rate Last Admin   acetaminophen (TYLENOL) tablet 650 mg  650 mg Oral Q6H PRN Patrici Ranks, MD   650 mg at 08/09/23 0428   cefTRIAXone (ROCEPHIN) 2 g in sodium chloride 0.9 % 100 mL IVPB  2 g Intravenous  Q24H Steffanie Dunn, DO   Stopped at 08/08/23 1359   Chlorhexidine Gluconate Cloth 2 % PADS 6 each  6 each Topical Daily Oretha Milch, MD   6 each at 08/09/23 1019   famotidine (PEPCID) tablet 10 mg  10 mg Oral QHS Paliwal, Aditya, MD   10 mg at 08/08/23 2116   gabapentin (NEURONTIN) capsule 100 mg  100 mg Oral BID Karie Fetch P, DO   100 mg at 08/09/23 1245   insulin aspart (novoLOG) injection 0-9 Units  0-9 Units Subcutaneous TID WC Rocky Morel, DO   1 Units at 08/09/23 0736   norepinephrine (LEVOPHED) 4mg  in (0.016 mg/mL) premix infusion  2-10 mcg/min Intravenous Titrated Conrad [place], MD   Stopped at 08/09/23 0752   ondansetron (ZOFRAN-ODT) disintegrating tablet 4 mg  4 mg Oral Q8H PRN Rocky Morel, DO       Oral care mouth rinse  15 mL Mouth Rinse 4 times per day Karie Fetch P, DO   15 mL at 08/09/23 1133   Oral care mouth rinse  15 mL Mouth Rinse PRN Karie Fetch P, DO       potassium chloride 10 mEq in 100 mL IVPB  10 mEq Intravenous Q1 Hr x 2 Steffanie Dunn, DO 100 mL/hr at 08/09/23 1251 10 mEq at 08/09/23 1251   rosuvastatin (CRESTOR) tablet 40 mg  40 mg Oral QHS Rocky Morel, DO       [START ON 08/10/2023] venlafaxine XR (EFFEXOR-XR) 24 hr capsule 75 mg  75 mg Oral Q breakfast Karie Fetch P, DO       Allergies as of 08/06/2023 - Review Complete 08/06/2023  Allergen Reaction Noted   Codeine Shortness Of Breath 03/27/2009   Clavulanic acid  11/23/2020   Flagyl [metronidazole]  07/31/2020   Other Other (See Comments) 11/14/2020   Prevacid [lansoprazole]  07/31/2020   Tetracyclines & related Itching 11/01/2014   Trulicity [dulaglutide]  07/31/2020   Azithromycin Itching and Rash 04/05/2013   Erythromycin Itching and Rash 03/27/2009   Morphine and codeine Itching and Rash 06/24/2011   Septra [sulfamethoxazole-trimethoprim] Itching and Rash 04/05/2013   Review of Systems:  Review of Systems  Constitutional:  Positive for chills.  Respiratory:  Negative for  shortness of breath.   Cardiovascular:  Negative for chest pain.  Gastrointestinal:  Positive for abdominal pain. Negative for blood in stool.    OBJECTIVE:   Temp:  [97.6 F (36.4 C)-100.4 F (38 C)] 97.6 F (36.4 C) (11/09 1107) Pulse Rate:  [58-92] 71 (11/09 1200) Resp:  [13-34] 33 (11/09 1215) BP: (76-148)/(38-116) 103/66 (11/09 1215) SpO2:  [75 %-100 %] 100 % (11/09 1200) Weight:  [79.3 kg] 79.3 kg (11/09 0351) Last BM Date : 08/08/23 Physical Exam Cardiovascular:     Rate and Rhythm: Normal rate and regular rhythm.  Pulmonary:  Effort: No respiratory distress.     Breath sounds: Normal breath sounds.  Abdominal:     General: Bowel sounds are normal. There is no distension.     Palpations: Abdomen is soft.  Skin:    General: Skin is warm and dry.     Labs: Recent Labs    08/07/23 1211 08/08/23 0259 08/08/23 1113 08/09/23 0630 08/09/23 0631  WBC 7.1 8.4  --   --  7.1  HGB 10.3* 9.9*  --   --  9.0*  HCT 29.6* 29.1*  --   --  26.2*  PLT 47* 34* 28* 28* 28*   BMET Recent Labs    08/07/23 1211 08/08/23 0259 08/09/23 0631  NA 133* 134* 132*  K 3.7 3.7 3.4*  CL 104 103 103  CO2 18* 20* 20*  GLUCOSE 148* 131* 142*  BUN 48* 48* 48*  CREATININE 3.25* 3.65* 3.35*  CALCIUM 8.0* 7.6* 7.6*   LFT Recent Labs    08/08/23 0259  PROT 5.1*  ALBUMIN 2.1*  AST 35  ALT 98*  ALKPHOS 133*  BILITOT 2.9*  BILIDIR 1.4*  IBILI 1.5*   PT/INR Recent Labs    08/08/23 1113 08/09/23 0630  LABPROT 16.0* 15.2  INR 1.3* 1.2   Diagnostic imaging: US Abdomen Limited RUQ (LIVER/GB)  Result Date: 08/08/2023 CLINICAL DATA:  History of prior cholecystectomy presenting with hyperbilirubinemia. EXAM: ULTRASOUND ABDOMEN LIMITED RIGHT UPPER QUADRANT COMPARISON:  May 29, 2015 FINDINGS: Gallbladder: The gallbladder is surgically absent. Common bile duct: Diameter: 4.6 mm Liver: No focal lesion identified. Within normal limits in parenchymal echogenicity. Portal vein is  patent on color Doppler imaging with normal direction of blood flow towards the liver. Other: Limited study secondary to limited patient positioning. IMPRESSION: 1. Findings consistent with history of prior cholecystectomy. 2. Otherwise, unremarkable right upper quadrant ultrasound. Electronically Signed   By: Aram Candela M.D.   On: 08/08/2023 23:38    IMPRESSION: Sepsis secondary to E.Coli and Enterobacter bacteremia, likely secondary to below though cannot r/o contribution from #4 Sigmoid and descending colon diverticulitis Transaminase elevation in mixed pattern, likely from sepsis and #4 Abnormal MRI imaging, focal stricturing in L bile duct concerning for malignancy unless proven otherwise, biliary dilatation Thrombocytopenia Acute kidney injury Normocytic anemia History choledocholithiasis 01/12/21 Personal history colon polyp 09/29/2010  PLAN: -Recommend eventual ERCP with possible spyglass to visualize biliary stricture and for biopsies, though not stable for procedure at this time with sepsis and thrombocytopenia, not candidate for percutaneous biopsy secondary to risk for seeding malignancy -Continue ICU care, may be transitioned out of ICU today as has not required vasopressor therapy since this AM -Continue IV antibiotic therapy -[facility] will follow   LOS: 3 days   Liliane Shi, [facility]

## 2023-08-10 DIAGNOSIS — K8309 Other cholangitis: Secondary | ICD-10-CM | POA: Diagnosis not present

## 2023-08-10 DIAGNOSIS — R6521 Severe sepsis with septic shock: Secondary | ICD-10-CM | POA: Diagnosis not present

## 2023-08-10 DIAGNOSIS — A419 Sepsis, unspecified organism: Secondary | ICD-10-CM | POA: Diagnosis not present

## 2023-08-10 DIAGNOSIS — G9341 Metabolic encephalopathy: Secondary | ICD-10-CM

## 2023-08-10 LAB — BASIC METABOLIC PANEL
Anion gap: 10 (ref 5–15)
BUN: 52 mg/dL — ABNORMAL HIGH (ref 8–23)
CO2: 18 mmol/L — ABNORMAL LOW (ref 22–32)
Calcium: 7.8 mg/dL — ABNORMAL LOW (ref 8.9–10.3)
Chloride: 104 mmol/L (ref 98–111)
Creatinine, Ser: 2.86 mg/dL — ABNORMAL HIGH (ref 0.44–1.00)
GFR, Estimated: 17 mL/min — ABNORMAL LOW (ref >60)
Glucose, Bld: 240 mg/dL — ABNORMAL HIGH (ref 70–99)
Potassium: 4.1 mmol/L (ref 3.5–5.1)
Sodium: 132 mmol/L — ABNORMAL LOW (ref 135–145)

## 2023-08-10 LAB — GLUCOSE, CAPILLARY
Glucose-Capillary: 219 mg/dL — ABNORMAL HIGH (ref 70–99)
Glucose-Capillary: 152 mg/dL — ABNORMAL HIGH (ref 70–99)
Glucose-Capillary: 262 mg/dL — ABNORMAL HIGH (ref 70–99)
Glucose-Capillary: 218 mg/dL — ABNORMAL HIGH (ref 70–99)
Glucose-Capillary: 227 mg/dL — ABNORMAL HIGH (ref 70–99)
Glucose-Capillary: 235 mg/dL — ABNORMAL HIGH (ref 70–99)

## 2023-08-10 LAB — CBC
HCT: 28.9 % — ABNORMAL LOW (ref 36.0–46.0)
Hemoglobin: 9.7 g/dL — ABNORMAL LOW (ref 12.0–15.0)
MCH: 29 pg (ref 26.0–34.0)
MCHC: 33.6 g/dL (ref 30.0–36.0)
MCV: 86.5 fL (ref 80.0–100.0)
Platelets: 34 10*3/uL — ABNORMAL LOW (ref 150–400)
RBC: 3.34 MIL/uL — ABNORMAL LOW (ref 3.87–5.11)
RDW: 14.5 % (ref 11.5–15.5)
WBC: 5.4 10*3/uL (ref 4.0–10.5)
nRBC: 0 % (ref 0.0–0.2)

## 2023-08-10 LAB — HEPATIC FUNCTION PANEL
ALT: 56 U/L — ABNORMAL HIGH (ref 0–44)
AST: 28 U/L (ref 15–41)
Albumin: 1.8 g/dL — ABNORMAL LOW (ref 3.5–5.0)
Alkaline Phosphatase: 248 U/L — ABNORMAL HIGH (ref 38–126)
Bilirubin, Direct: 3.4 mg/dL — ABNORMAL HIGH (ref 0.0–0.2)
Indirect Bilirubin: 1.6 mg/dL — ABNORMAL HIGH (ref 0.3–0.9)
Total Bilirubin: 5 mg/dL — ABNORMAL HIGH (ref <1.2)
Total Protein: 5.5 g/dL — ABNORMAL LOW (ref 6.5–8.1)

## 2023-08-10 MED ORDER — OXYCODONE HCL 5 MG PO TABS
5.0000 mg | ORAL_TABLET | Freq: Four times a day (QID) | ORAL | Status: DC | PRN
  Administered 2023-08-10: 5 mg via ORAL
  Filled 2023-08-10: qty 1

## 2023-08-10 MED ORDER — SODIUM CHLORIDE 0.9% IV SOLUTION: Freq: Once | INTRAVENOUS | Status: AC

## 2023-08-10 MED ORDER — DIPHENHYDRAMINE HCL 50 MG/ML IJ SOLN
25.0000 mg | Freq: Once | INTRAMUSCULAR | Status: AC
  Administered 2023-08-10: 25 mg via INTRAVENOUS
  Filled 2023-08-10: qty 1

## 2023-08-10 MED ORDER — LACTATED RINGERS IV BOLUS
500.0000 mL | Freq: Once | INTRAVENOUS | Status: AC
  Administered 2023-08-10: 500 mL via INTRAVENOUS

## 2023-08-10 MED ORDER — ORAL CARE MOUTH RINSE: 15.0000 mL | OROMUCOSAL | Status: DC | PRN

## 2023-08-10 MED ORDER — INSULIN ASPART 100 UNIT/ML IJ SOLN
0.0000 [IU] | Freq: Every day | INTRAMUSCULAR | Status: DC
  Administered 2023-08-10 – 2023-08-11 (×2): 2 [IU] via SUBCUTANEOUS

## 2023-08-10 NOTE — Progress Notes (Signed)
eLink Physician-Brief Progress Note Patient Name: SUTTON BOSSARD DOB: 02/04/1946 MRN: [id number]   Date of Service  08/10/2023  HPI/Events of Note  Bedside RN requesting bedtime sliding scale for hyperglycemia.  eICU Interventions  Bedtime sliding scale ordered.     Intervention Category Intermediate Interventions: Hyperglycemia - evaluation and treatment  Carilyn Goodpasture 08/10/2023, 11:48 PM

## 2023-08-10 NOTE — Plan of Care (Signed)
  Problem: Education: Goal: Knowledge of General Education information will improve Description: Including pain rating scale, medication(s)/side effects and non-pharmacologic comfort measures Outcome: Progressing   Problem: Pain Management: Goal: General experience of comfort will improve Outcome: Progressing   Problem: Safety: Goal: Ability to remain free from injury will improve Outcome: Progressing

## 2023-08-10 NOTE — Plan of Care (Signed)
  Problem: Education: Goal: Knowledge of General Education information will improve Description: Including pain rating scale, medication(s)/side effects and non-pharmacologic comfort measures Outcome: Progressing   Problem: Elimination: Goal: Will not experience complications related to bowel motility Outcome: Progressing   Problem: Pain Management: Goal: General experience of comfort will improve Outcome: Progressing   Problem: Skin Integrity: Goal: Risk for impaired skin integrity will decrease Outcome: Progressing   Problem: Clinical Measurements: Goal: Will remain free from infection Outcome: Not Progressing   Problem: Nutrition: Goal: Adequate nutrition will be maintained Outcome: Not Progressing

## 2023-08-10 NOTE — Progress Notes (Signed)
[facility] Progress Note  SUBJECTIVE:   Interval history: Tina Briggs was seen and evaluated today at bedside. Resting in bed with family (spouse and son) at bedside. She appears clinically improved from yesterday, no longer with shaking chills, able to converse with me. She had small amount of oral intake this AM. She has 1/10 RUQ abdominal pain. No chest pain or shortness of breath. Had small brown BM yesterday.   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 Mild to mod TI scarring. (Dr. Eliott Nine)   HNP (herniated nucleus pulposus), lumbar    Recurrent   HYPERLIPIDEMIA-MIXED    HYPERTENSION, UNSPECIFIED    Hypertensive retinopathy    Injury of right hip    Lumbar back pain    Macular degeneration    Microscopic hematuria    Neuropathy    OBESITY    Plantar fasciitis    PONV (postoperative nausea and vomiting)    past history only, "sometimes they put a patch on me"   Right eye injury    Past Surgical History:  Procedure Laterality Date   ABDOMINAL HYSTERECTOMY     APPENDECTOMY     BACK SURGERY     lumbar '09"microdiscectomy"   BREAST BIOPSY     BREAST BIOPSY Right 05/19/2023   MM RT BREAST BX W LOC DEV EA AD LESION IMG BX SPEC STEREO GUIDE 05/19/2023 [facility]   BREAST BIOPSY Right 05/19/2023   MM RT BREAST BX W LOC DEV 1ST LESION IMAGE BX SPEC STEREO GUIDE 05/19/2023 [facility]   breat Bx benign     CHOLECYSTECTOMY     open   COLONOSCOPY WITH PROPOFOL N/A 11/22/2014   Procedure: COLONOSCOPY WITH PROPOFOL;  Surgeon:  Charolett Bumpers, MD;  Location: [facility];  Service: Endoscopy;  Laterality: N/A;   ERCP N/A 05/26/2015   Procedure: ENDOSCOPIC RETROGRADE CHOLANGIOPANCREATOGRAPHY (ERCP)   (DOING CASE IN MAIN OR);  Surgeon: Dorena Cookey, MD;  Location: [facility];  Service: Gastroenterology;  Laterality: N/A;   ERCP N/A 06/06/2015   Procedure: ENDOSCOPIC RETROGRADE CHOLANGIOPANCREATOGRAPHY (ERCP);  Surgeon: Vida Rigger, MD;  Location: [facility];  Service: Endoscopy;  Laterality: N/A;   ERCP N/A 01/12/2021   Procedure: ENDOSCOPIC RETROGRADE CHOLANGIOPANCREATOGRAPHY (ERCP);  Surgeon: Vida Rigger, MD;  Location: [facility];  Service: Endoscopy;  Laterality: N/A;   ESOPHAGOGASTRODUODENOSCOPY (EGD) WITH PROPOFOL N/A 11/22/2014   Procedure: ESOPHAGOGASTRODUODENOSCOPY (EGD) WITH PROPOFOL;  Surgeon: Charolett Bumpers, MD;  Location: [facility];  Service: Endoscopy;  Laterality: N/A;   EYE SURGERY Bilateral    cataracts   GANGLION CYST EXCISION Right    LIPOMA EXCISION  1993   From thigh 1993   LUMBAR FUSION  09/16/2018   LUMBAR LAMINECTOMY/DECOMPRESSION MICRODISCECTOMY Left 12/31/2017   Procedure: Laminectomy and Foraminotomy - Lumbar Three-Four - left Extraforaminal diskectomy Lumbar Four-Five left;  Surgeon: Tia Alert, MD;  Location: [facility] OR;  Service: Neurosurgery;  Laterality: Left;   PERONEAL NERVE DECOMPRESSION Left 12/31/2017   Procedure:  Left PERONEAL NERVE DECOMPRESSION;  Surgeon: Tia Alert, MD;  Location[facility] OR;  Service: Neurosurgery;  Laterality: Left;  left   PERONEAL NERVE DECOMPRESSION     REMOVAL OF STONES  01/12/2021   Procedure: REMOVAL OF STONES;  Surgeon: Vida Rigger, MD;  Location: [facility];  Service: Endoscopy;;   Right ganglion cyst     Right ganglion cyst remove     TUBAL LIGATION     Current Facility-Administered Medications  Medication Dose Route Frequency Provider Last Rate Last Admin   0.9 %  sodium chloride infusion  250 mL Intravenous Continuous Steffanie Dunn,  DO 10 mL/hr at 08/10/23 0600 Infusion Verify at 08/10/23 0600   acetaminophen (TYLENOL) tablet 650 mg  650 mg Oral Q6H PRN Rocky Morel, DO   650 mg at 08/10/23 0916   cefTRIAXone (ROCEPHIN) 2 g in sodium chloride 0.9 % 100 mL IVPB  2 g Intravenous Q24H Rocky Morel, DO   Stopped at 08/09/23 1543   Chlorhexidine Gluconate Cloth 2 % PADS 6 each  6 each Topical Daily Rocky Morel, DO   6 each at 08/09/23 1019   famotidine (PEPCID) tablet 10 mg  10 mg Oral QHS Rocky Morel, DO   10 mg at 08/09/23 2302   gabapentin (NEURONTIN) capsule 100 mg  100 mg Oral BID Karie Fetch P, DO   100 mg at 08/10/23 0915   hydrocortisone sodium succinate (SOLU-CORTEF) 100 MG injection 100 mg  100 mg Intravenous Q12H Rocky Morel, DO   100 mg at 08/10/23 0432   insulin aspart (novoLOG) injection 0-9 Units  0-9 Units Subcutaneous TID WC Rocky Morel, DO   2 Units at 08/10/23 0815   norepinephrine (LEVOPHED) 4mg  in (0.016 mg/mL) premix infusion  2-10 mcg/min Intravenous Titrated Steffanie Dunn, DO   Stopped at 08/10/23 0430   ondansetron (ZOFRAN-ODT) disintegrating tablet 4 mg  4 mg Oral Q8H PRN Rocky Morel, DO   4 mg at 08/10/23 0548   Oral care mouth rinse  15 mL Mouth Rinse 4 times per day Rocky Morel, DO   15 mL at 08/10/23 0800   Oral care mouth rinse  15 mL Mouth Rinse PRN Rocky Morel, DO       rosuvastatin (CRESTOR) tablet 40 mg  40 mg Oral QHS Rocky Morel, DO   40 mg at 08/09/23 2303   venlafaxine XR (EFFEXOR-XR) 24 hr capsule 75 mg  75 mg Oral Q breakfast Karie Fetch P, DO   75 mg at 08/10/23 0916   Allergies as of 08/06/2023 - Review Complete 08/06/2023  Allergen Reaction Noted   Codeine Shortness Of Breath 03/27/2009   Clavulanic acid  11/23/2020   Flagyl [metronidazole]  07/31/2020   Other Other (See Comments) 11/14/2020   Prevacid [lansoprazole]  07/31/2020   Tetracyclines & related Itching 11/01/2014   Trulicity [dulaglutide]  07/31/2020   Azithromycin Itching and  Rash 04/05/2013   Erythromycin Itching and Rash 03/27/2009   Morphine and codeine Itching and Rash 06/24/2011   Septra [sulfamethoxazole-trimethoprim] Itching and Rash 04/05/2013   Review of Systems:  Review of Systems  Respiratory:  Negative for shortness of breath.   Cardiovascular:  Negative for chest pain.  Gastrointestinal:  Positive for abdominal pain. Negative for blood in stool.    OBJECTIVE:   Temp:  [97.5 F (36.4 C)-99.4 F (37.4 C)] 97.6 F (36.4 C) (11/10 0752) Pulse Rate:  [57-189] 65 (11/10 0645) Resp:  [11-33] 18 (11/10 0645) BP: (82-175)/(27-135) 96/53 (11/10 0645) SpO2:  [  71 %-100 %] 93 % (11/10 0645) Weight:  [78.6 kg] 78.6 kg (11/10 0500) Last BM Date : 08/08/23 Physical Exam Constitutional:      General: She is not in acute distress.    Appearance: She is ill-appearing. She is not toxic-appearing.  Cardiovascular:     Rate and Rhythm: Normal rate and regular rhythm.  Pulmonary:     Effort: No respiratory distress.     Breath sounds: Normal breath sounds.  Abdominal:     General: Bowel sounds are normal. There is no distension.     Palpations: Abdomen is soft.     Tenderness: There is abdominal tenderness (RUQ). There is no guarding.  Skin:    General: Skin is warm.     Coloration: Skin is jaundiced and pale.  Neurological:     Mental Status: She is alert.     Labs: Recent Labs    08/08/23 0259 08/08/23 1113 08/09/23 0630 08/09/23 0631 08/10/23 0232  WBC 8.4  --   --  7.1 5.4  HGB 9.9*  --   --  9.0* 9.7*  HCT 29.1*  --   --  26.2* 28.9*  PLT 34*   < > 28* 28* 34*   < > = values in this interval not displayed.   BMET Recent Labs    08/07/23 1211 08/08/23 0259 08/09/23 0631  NA 133* 134* 132*  K 3.7 3.7 3.4*  CL 104 103 103  CO2 18* 20* 20*  GLUCOSE 148* 131* 142*  BUN 48* 48* 48*  CREATININE 3.25* 3.65* 3.35*  CALCIUM 8.0* 7.6* 7.6*   LFT Recent Labs    08/10/23 0232  PROT 5.5*  ALBUMIN 1.8*  AST 28  ALT 56*  ALKPHOS  248*  BILITOT 5.0*  BILIDIR 3.4*  IBILI 1.6*   PT/INR Recent Labs    08/08/23 1113 08/09/23 0630  LABPROT 16.0* 15.2  INR 1.3* 1.2   Diagnostic imaging: CT HEAD WO CONTRAST ( )  Result Date: 08/09/2023 CLINICAL DATA:  Initial evaluation for mental status change, unknown cause. EXAM: CT HEAD WITHOUT CONTRAST TECHNIQUE: Contiguous axial images were obtained from the base of the skull through the vertex without intravenous contrast. RADIATION DOSE REDUCTION: This exam was performed according to the departmental dose-optimization program which includes automated exposure control, adjustment of the mA and/or kV according to patient size and/or use of iterative reconstruction technique. COMPARISON:  Prior study from 08/06/2023. FINDINGS: Brain: Cerebral volume within normal limits. Mild chronic microvascular ischemic disease. No acute intracranial hemorrhage. No acute large vessel territory infarct. No mass lesion or midline shift. No hydrocephalus or extra-axial fluid collection. Vascular: No abnormal hyperdense vessel. Skull: Scalp soft tissues demonstrate no acute finding. Calvarium intact. Sinuses/Orbits: Globes orbital soft tissues within normal limits. Mild mucoperiosteal thickening present about the ethmoidal air cells. Paranasal sinuses are otherwise clear. No mastoid effusion. Other: None. IMPRESSION: 1. No acute intracranial abnormality. 2. Mild chronic microvascular ischemic disease. Electronically Signed   By: Rise Mu M.D.   On: 08/09/2023 18:58   CT ABDOMEN PELVIS WO CONTRAST  Result Date: 08/09/2023 CLINICAL DATA:  Sepsis EXAM: CT ABDOMEN AND PELVIS WITHOUT CONTRAST TECHNIQUE: Multidetector CT imaging of the abdomen and pelvis was performed following the standard protocol without IV contrast. RADIATION DOSE REDUCTION: This exam was performed according to the departmental dose-optimization program which includes automated exposure control, adjustment of the mA and/or kV  according to patient size and/or use of iterative reconstruction technique. COMPARISON:  CT abdomen and pelvis 10/13/2018.  CT abdomen and pelvis 01/12/2021. CT abdomen and pelvis 08/06/2023. MRI abdomen 07/31/2023. FINDINGS: Lower chest: There are small bilateral pleural effusions with bibasilar atelectasis. Hepatobiliary: The gallbladder is surgically absent. There is stable intrahepatic biliary ductal dilatation in the left lobe of the liver. Hyperdensities are seen in the common bile duct measuring up to 14 mm distally which may be related to choledocholithiasis. Hyperdensity near the pancreatic head is new from prior and may represent new stone. No focal liver lesion identified allowing for lack of intravenous contrast. Pancreas: There is mild inflammatory stranding surrounding the head of the pancreas. No ductal dilatation or fluid collection identified. Spleen: Normal in size without focal abnormality. Adrenals/Urinary Tract: Adrenal glands are unremarkable. Kidneys are normal, without renal calculi, focal lesion, or hydronephrosis. Bladder is unremarkable. Stomach/Bowel: There is descending and sigmoid colon diverticulosis. Large hiatal hernia is again seen. There is wall thickening and mild inflammation surrounding the duodenal. Duodenal diverticulum present. No free air, bowel obstruction or pneumatosis. Vascular/Lymphatic: Aortic atherosclerosis. No enlarged abdominal or pelvic lymph nodes. Reproductive: Status post hysterectomy. No adnexal masses. Other: There is trace free fluid in the lower abdomen. There is no focal abdominal wall hernia. There is mild body wall edema. Musculoskeletal: L4-5 posterior fusion hardware is present. IMPRESSION: 1. Mild inflammatory stranding surrounding the head of the pancreas and duodenum compatible with acute pancreatitis and duodenitis. 2. New hyperdensities in the common bile duct measuring up to 14 mm distally may be related to choledocholithiasis. There is stable  intrahepatic biliary ductal dilatation in the left lobe of the liver. 3. Small bilateral pleural effusions with bibasilar atelectasis. 4. Trace free fluid in the lower abdomen. 5. Large hiatal hernia. 6. Colonic diverticulosis. 7. Mild body wall edema. 8. Aortic atherosclerosis. Aortic Atherosclerosis (ICD10-I70.0). Electronically Signed   By: Darliss Cheney M.D.   On: 08/09/2023 17:27   US Abdomen Limited RUQ (LIVER/GB)  Result Date: 08/08/2023 CLINICAL DATA:  History of prior cholecystectomy presenting with hyperbilirubinemia. EXAM: ULTRASOUND ABDOMEN LIMITED RIGHT UPPER QUADRANT COMPARISON:  May 29, 2015 FINDINGS: Gallbladder: The gallbladder is surgically absent. Common bile duct: Diameter: 4.6 mm Liver: No focal lesion identified. Within normal limits in parenchymal echogenicity. Portal vein is patent on color Doppler imaging with normal direction of blood flow towards the liver. Other: Limited study secondary to limited patient positioning. IMPRESSION: 1. Findings consistent with history of prior cholecystectomy. 2. Otherwise, unremarkable right upper quadrant ultrasound. Electronically Signed   By: Aram Candela M.D.   On: 08/08/2023 23:38    IMPRESSION: Sepsis secondary to E.Coli and Enterobacter bacteremia, suspect from choledocholithiasis and ascending cholangitis Sigmoid and descending colon diverticulitis Transaminase elevation in mixed pattern, multifactorial Abnormal MRI imaging, focal stricturing in L bile duct concerning for malignancy unless proven otherwise Biliary dilatation Thrombocytopenia Acute kidney injury Normocytic anemia History choledocholithiasis 01/12/21 s/p ERCP Personal history colon polyp 09/29/2010  PLAN: -Concern for ascending cholangitis from choledocholithiasis as cause of septic shock and bacteremia -Spoke to ERCP weekend coverage, Dr. Marina Goodell, about patient's current clinical status, given current stability will plan for ERCP on 08/11/23 -Discussed ERCP  with patient and her family at bedside including benefits, alternatives and risks of bleeding/infection/perforation/missed lesion/pancreatitis/anesthesia, she verbalized understanding and elected to proceed -Continue IV fluids and IV antibiotic therapy -Pain control per ICU team -Ok for diet today, NPO at midnight for ERCP on 08/11/23 -[facility] will follow   LOS: 4 days   Liliane Shi, [facility]

## 2023-08-10 NOTE — Progress Notes (Signed)
NAME:  Tina Briggs, MRN:  [id number], DOB:  10-May-1946, LOS: 4 ADMISSION DATE:  08/06/2023, CONSULTATION DATE:  08/06/2023 REFERRING MD:  MCDB, CHIEF COMPLAINT:  Hypotension   History of Present Illness:  Tina Briggs is a 77 y.o. female who has a PMH as below including but not limited to HTN, HLD, DM, IBS, anxiety, depression. He presented to [facility] 11/6 with diarrhea, nausea, confusion x 3 days. Was apparently hypotensive since the day earlier. Diarrhea was water like, non bloody. No recent abx use or exposure to known persons with C.diff. BP soft initially but did respond to fluids; however, lactate climbed. CT abd/pelv was done and suggestive of sigmoid diverticulitis. He was started on Zosyn with continued IVF's.   Due to climbing lactate, PCCM called for transfer to [facility] ICU.  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 Mild to mod TI scarring. (Dr. Eliott Nine)   HNP (herniated nucleus pulposus), lumbar    Recurrent   HYPERLIPIDEMIA-MIXED    HYPERTENSION, UNSPECIFIED    Hypertensive retinopathy    Injury of right hip    Lumbar back pain    Macular degeneration    Microscopic hematuria    Neuropathy    OBESITY    Plantar fasciitis    PONV (postoperative nausea and vomiting)    past history only, "sometimes they put a patch on me"   Right eye injury     Significant Hospital Events: Including procedures, antibiotic start and stop dates in addition to other pertinent events   11/6 admitted to ICU with septic vs  hypovolemic shock and uncomplicated diverticulitis 11/8 started on amiodarone for afib overnight and in the afternoon was off pressors for 5-6 hours 11/9 off pressors at 7 am  Interim History / Subjective:  Tina Briggs is a 77 y/o woman with a history of HTN, HLD, DM who presented with septic shock and E coli bacteremia. Today she has upper abdominal and back pain. Yesterday she had another afternoon episode of hypotension. Her delirium at that time got better after her fever came down with tylenol.   Objective   Blood pressure 105/60, pulse 69, temperature 97.6 F (36.4 C), temperature source Axillary, resp. rate 20, height 5\' 3"  (1.6 m), weight 78.6 kg, SpO2 100%.        Intake/Output Summary (Last 24 hours) at 08/10/2023 0939 Last data filed at 08/10/2023 0800 Gross per 24 hour  Intake 1212.27 ml  Output 1150 ml  Net 62.27 ml   Filed Weights   08/08/23 0122 08/09/23 0351 08/10/23 0500  Weight: 79.1 kg 79.3 kg 78.6 kg    Examination: Constitutional: Ill appearing woman, much more alert than yesterday afternoon. Normal speech, answering questions appropriately.  HEENT: [place]/AT, eyes anicteric Cardio: S1S2, RRR Pulm: breathing comfortably on [place], CTAB, no tachypnea Abdomen: soft, minimally TTP upper abdomen MSK:  no edema, no cyanosis Skin: warm, dry, no rashes Neuro: awake, alert, moving all  extremities, face symmetric, normal speech, answering questions appropriately.   CT abdomen pelvis 11/9 reviewed> small dependent bilateral pleural effusions. No perforation or abscess, evidence of pancreatic inflammation and cyst.   CT head> no acute findings  Repeat blood cultures 11/9- pending  Resolved Hospital Problem list   Acute metabolic encephalopathy  Assessment & Plan:  Septic &  hypovolemic shock 2/2 E. coli bacteremia-- suspect based on repeat CT this is ascending cholangitis, now with pancreatitis; alk phos and bilirubin rising Acute Uncomplicated Sigmoid diverticulitis  with severe diarrhea - has chronic recurrent diverticulitis. C. Diff negative No evidence of UTI -con't ceftriaxone -full liquid diet, NPO past midnight for ERCP tomorrow -norepi PRN for MAP >65-- seems to have afternoon decompensations so will remain in ICU today. Worry she doesn't have source control.  -stress dose steroids started 11/9  Intrahepatic biliary duct dilation ; evidence of possible gallstone near the pancreatic head -ERCP tomorrow with [facility]   AKI on CKD likely 3b - suspect hypovolemia, stable with good UOP Hyponatremia -recheck renal function today -strict I/O -maintain adequate perfusion; MAP >65 -needs source control for sepsis -hold PTA ARB; renally dose meds and avoid nephrotoxic meds  Thrombocytopenia due to sepsis; has not had evidence of DIC -monitor; anticipate she will require a transfusion tomorrow before ERCP. -still need to hold chemical DVT prophylaxis due to bleeding risk  Afib w/ RVR-resolved, new onset.  -can start [facility] if she has recurrence, but this was likely due to acute illness -monitor on tele -previously on amiodarone for about 1 day, then d/c -can resume PTA metoprolol once stable off pressors  Hx HTN, HLD. -hold PTA antihypertensives  At risk for malnutrition-- doing a letter better on full liquid diet. -NPO past midnight, need to be cautious with diet and evidence of pancreatitis and cholangitis.    Hx DM- A1c 5.9 Hypoglycemia -hold PTA semaglutide, farxiga,   -SSI PRN -goal BG 140-180   Hx Anxiety, Depression -con't PTA effexor, reduced dose gabapentin  HLD -ok to con't statin  Best Practice (right click and "Reselect all SmartList Selections" daily)   Diet/type: full liquids  DVT prophylaxis: SCD GI prophylaxis: N/A Lines: N/A Foley:  N/A Code Status:  full code Last date of multidisciplinary goals of care discussion [08/09/2023]  Labs   CBC: Recent Labs  Lab 08/06/23 1310 08/06/23 2012 08/07/23 1211  08/08/23 0259 08/08/23 1113 08/09/23 0630 08/09/23 0631 08/10/23 0232  WBC 22.4*  --  7.1 8.4  --   --  7.1 5.4  NEUTROABS 19.2*  --   --   --   --   --   --   --   HGB 12.2 9.5* 10.3* 9.9*  --   --  9.0* 9.7*  HCT 36.1 28.0* 29.6* 29.1*  --   --  26.2* 28.9*  MCV 88.3  --  85.8 85.8  --   --  85.6 86.5  PLT 91*  --  47* 34* 28* 28* 28* 34*    Basic Metabolic Panel: Recent Labs  Lab 08/06/23 1310 08/06/23 2012 08/06/23 2118 08/07/23 1211 08/08/23 0259 08/09/23 0631  NA 129* 133* 135 133* 134* 132*  K 4.8 3.6 4.2 3.7 3.7 3.4*  CL 98  --  105 104 103 103  CO2 17*  --  19* 18* 20* 20*  GLUCOSE 190*  --  156* 148* 131* 142*  BUN 47*  --  45* 48* 48* 48*  CREATININE 3.51*  --  3.12* 3.25* 3.65* 3.35*  CALCIUM 8.7*  --  7.7* 8.0* 7.6* 7.6*  MG  --   --  1.0* 3.0* 2.9*  --   PHOS  --   --   --  2.8 4.0  --    GFR: Estimated Creatinine Clearance: 14.2 mL/min (A) (by C-G formula based on SCr of 3.35 mg/dL (H)). Recent Labs  Lab 08/06/23 1310 08/06/23 1424 08/06/23 1823 08/06/23 2118 08/07/23 1211 08/08/23 0259 08/09/23 0631 08/10/23 0232  PROCALCITON  --   --   --  58.91  --   --   --   --   WBC 22.4*  --   --   --  7.1 8.4 7.1 5.4  LATICACIDVEN 4.6* 6.8* 3.7*  --   --   --   --   --     Liver Function Tests: Recent Labs  Lab 08/06/23 1310 08/07/23 1211 08/08/23 0259 08/10/23 0232  AST 142* 56* 35 28  ALT 230* 127* 98* 56*  ALKPHOS 80 133* 133* 248*  BILITOT 1.6* 2.1* 2.9* 5.0*  PROT 6.5 4.9* 5.1* 5.5*  ALBUMIN 3.7 2.2* 2.1* 1.8*       Critical care time:     This patient is critically ill with multiple organ system failure which requires frequent high complexity decision making, assessment, support, evaluation, and titration of therapies. This was completed through the application of advanced monitoring technologies and extensive interpretation of multiple databases. During this encounter critical care time was devoted to patient care services described  in this note for 40 minutes.  Steffanie Dunn, DO 08/10/23 10:13 AM [facility]  For contact information, see Amion. If no response to pager, please call PCCM consult pager. After hours, 7PM- 7AM, please call El[facility].

## 2023-08-10 NOTE — Progress Notes (Signed)
Facial itching with oxycodone, has happened to her before apparently.   Benadryl ordered, stopping oxy. Already on stress dose steroids.  Steffanie Dunn, DO 08/10/23 12:44 PM [facility]  For contact information, see Amion. If no response to pager, please call PCCM consult pager. After hours, 7PM- 7AM, please call Elink.

## 2023-08-11 ENCOUNTER — Encounter (HOSPITAL_COMMUNITY): Payer: Self-pay | Admitting: Pulmonary Disease

## 2023-08-11 ENCOUNTER — Inpatient Hospital Stay (HOSPITAL_COMMUNITY): Payer: PPO | Admitting: Registered Nurse

## 2023-08-11 ENCOUNTER — Inpatient Hospital Stay (HOSPITAL_COMMUNITY): Payer: PPO

## 2023-08-11 ENCOUNTER — Encounter (HOSPITAL_COMMUNITY)
Admission: EM | Disposition: A | Discharge status: Home Health | Payer: Self-pay | Source: Home / Self Care | Attending: Internal Medicine

## 2023-08-11 DIAGNOSIS — K8030 Calculus of bile duct with cholangitis, unspecified, without obstruction: Secondary | ICD-10-CM | POA: Diagnosis not present

## 2023-08-11 DIAGNOSIS — N183 Chronic kidney disease, stage 3 unspecified: Secondary | ICD-10-CM

## 2023-08-11 DIAGNOSIS — E1122 Type 2 diabetes mellitus with diabetic chronic kidney disease: Secondary | ICD-10-CM

## 2023-08-11 DIAGNOSIS — I129 Hypertensive chronic kidney disease with stage 1 through stage 4 chronic kidney disease, or unspecified chronic kidney disease: Secondary | ICD-10-CM | POA: Diagnosis not present

## 2023-08-11 DIAGNOSIS — R6521 Severe sepsis with septic shock: Secondary | ICD-10-CM | POA: Diagnosis not present

## 2023-08-11 DIAGNOSIS — A419 Sepsis, unspecified organism: Secondary | ICD-10-CM | POA: Diagnosis not present

## 2023-08-11 HISTORY — PX: REMOVAL OF STONES: SHX5545

## 2023-08-11 HISTORY — PX: BILIARY STENT PLACEMENT: SHX5538

## 2023-08-11 HISTORY — PX: SPHINCTEROTOMY: SHX5544

## 2023-08-11 HISTORY — PX: ERCP: SHX5425

## 2023-08-11 LAB — PREPARE PLATELET PHERESIS
Unit division: 0
Unit division: 0

## 2023-08-11 LAB — CBC
HCT: 29.2 % — ABNORMAL LOW (ref 36.0–46.0)
Hemoglobin: 10 g/dL — ABNORMAL LOW (ref 12.0–15.0)
MCH: 29.7 pg (ref 26.0–34.0)
MCHC: 34.2 g/dL (ref 30.0–36.0)
MCV: 86.6 fL (ref 80.0–100.0)
Platelets: 80 10*3/uL — ABNORMAL LOW (ref 150–400)
RBC: 3.37 MIL/uL — ABNORMAL LOW (ref 3.87–5.11)
RDW: 14.8 % (ref 11.5–15.5)
WBC: 13.8 10*3/uL — ABNORMAL HIGH (ref 4.0–10.5)
nRBC: 0 % (ref 0.0–0.2)

## 2023-08-11 LAB — BPAM PLATELET PHERESIS
Blood Product Expiration Date: 202411102359
Blood Product Expiration Date: 202411102359
ISSUE DATE / TIME: 202411102316
ISSUE DATE / TIME: 202411102316
Unit Type and Rh: 5100
Unit Type and Rh: 7300

## 2023-08-11 LAB — BASIC METABOLIC PANEL
Anion gap: 10 (ref 5–15)
BUN: 53 mg/dL — ABNORMAL HIGH (ref 8–23)
CO2: 18 mmol/L — ABNORMAL LOW (ref 22–32)
Calcium: 7.6 mg/dL — ABNORMAL LOW (ref 8.9–10.3)
Chloride: 102 mmol/L (ref 98–111)
Creatinine, Ser: 2.5 mg/dL — ABNORMAL HIGH (ref 0.44–1.00)
GFR, Estimated: 19 mL/min — ABNORMAL LOW (ref >60)
Glucose, Bld: 215 mg/dL — ABNORMAL HIGH (ref 70–99)
Potassium: 3.9 mmol/L (ref 3.5–5.1)
Sodium: 130 mmol/L — ABNORMAL LOW (ref 135–145)

## 2023-08-11 LAB — GLUCOSE, CAPILLARY
Glucose-Capillary: 204 mg/dL — ABNORMAL HIGH (ref 70–99)
Glucose-Capillary: 185 mg/dL — ABNORMAL HIGH (ref 70–99)
Glucose-Capillary: 195 mg/dL — ABNORMAL HIGH (ref 70–99)
Glucose-Capillary: 240 mg/dL — ABNORMAL HIGH (ref 70–99)
Glucose-Capillary: 227 mg/dL — ABNORMAL HIGH (ref 70–99)

## 2023-08-11 SURGERY — ERCP, WITH INTERVENTION IF INDICATED
Anesthesia: General

## 2023-08-11 MED ORDER — PHENYLEPHRINE HCL-NACL 20-0.9 MG/250ML-% IV SOLN
INTRAVENOUS | Status: DC | PRN
  Administered 2023-08-11: 40 ug/min via INTRAVENOUS

## 2023-08-11 MED ORDER — SODIUM CHLORIDE 0.9% IV SOLUTION
Freq: Once | INTRAVENOUS | Status: AC
Start: 2023-08-11 — End: 2023-08-11

## 2023-08-11 MED ORDER — DICLOFENAC SUPPOSITORY 100 MG
RECTAL | Status: AC
  Filled 2023-08-11: qty 1

## 2023-08-11 MED ORDER — LACTATED RINGERS IV SOLN: INTRAVENOUS | Status: DC

## 2023-08-11 MED ORDER — SUGAMMADEX SODIUM 200 MG/2ML IV SOLN
INTRAVENOUS | Status: DC | PRN
  Administered 2023-08-11: 200 mg via INTRAVENOUS

## 2023-08-11 MED ORDER — LIDOCAINE 2% (20 MG/ML) 5 ML SYRINGE
INTRAMUSCULAR | Status: DC | PRN
  Administered 2023-08-11: 80 mg via INTRAVENOUS

## 2023-08-11 MED ORDER — SODIUM CHLORIDE 0.9 % IV SOLN
INTRAVENOUS | Status: DC | PRN
  Administered 2023-08-11: 30 mL

## 2023-08-11 MED ORDER — DICLOFENAC SUPPOSITORY 100 MG
RECTAL | Status: DC | PRN
  Administered 2023-08-11: 100 mg via RECTAL

## 2023-08-11 MED ORDER — ONDANSETRON HCL 4 MG/2ML IJ SOLN
INTRAMUSCULAR | Status: DC | PRN
  Administered 2023-08-11: 4 mg via INTRAVENOUS

## 2023-08-11 MED ORDER — FENTANYL CITRATE (PF) 250 MCG/5ML IJ SOLN
INTRAMUSCULAR | Status: DC | PRN
  Administered 2023-08-11 (×2): 50 ug via INTRAVENOUS

## 2023-08-11 MED ORDER — PROPOFOL 10 MG/ML IV BOLUS
INTRAVENOUS | Status: DC | PRN
  Administered 2023-08-11: 120 mg via INTRAVENOUS

## 2023-08-11 MED ORDER — LACTATED RINGERS IV SOLN: INTRAVENOUS | Status: DC | PRN

## 2023-08-11 MED ORDER — ROCURONIUM BROMIDE 10 MG/ML (PF) SYRINGE
PREFILLED_SYRINGE | INTRAVENOUS | Status: DC | PRN
  Administered 2023-08-11: 60 mg via INTRAVENOUS

## 2023-08-11 MED ORDER — PHENYLEPHRINE 80 MCG/ML (10ML) SYRINGE FOR IV PUSH (FOR BLOOD PRESSURE SUPPORT)
PREFILLED_SYRINGE | INTRAVENOUS | Status: DC | PRN
  Administered 2023-08-11: 160 ug via INTRAVENOUS
  Administered 2023-08-11: 80 ug via INTRAVENOUS
  Administered 2023-08-11: 160 ug via INTRAVENOUS

## 2023-08-11 MED ORDER — GABAPENTIN 100 MG PO CAPS
100.0000 mg | ORAL_CAPSULE | Freq: Two times a day (BID) | ORAL | Status: DC
  Administered 2023-08-11 – 2023-08-19 (×17): 100 mg via ORAL
  Filled 2023-08-11 (×16): qty 1

## 2023-08-11 MED ORDER — FENTANYL CITRATE (PF) 100 MCG/2ML IJ SOLN
INTRAMUSCULAR | Status: AC
  Filled 2023-08-11: qty 2

## 2023-08-11 MED ORDER — CIPROFLOXACIN IN D5W 400 MG/200ML IV SOLN
INTRAVENOUS | Status: AC
  Filled 2023-08-11: qty 200

## 2023-08-11 MED ORDER — GLUCAGON HCL RDNA (DIAGNOSTIC) 1 MG IJ SOLR
INTRAMUSCULAR | Status: AC
  Filled 2023-08-11: qty 1

## 2023-08-11 MED ORDER — VENLAFAXINE HCL ER 75 MG PO CP24
75.0000 mg | ORAL_CAPSULE | Freq: Every day | ORAL | Status: DC
  Administered 2023-08-11 – 2023-08-19 (×8): 75 mg via ORAL
  Filled 2023-08-11 (×10): qty 1

## 2023-08-11 NOTE — Op Note (Signed)
[facility] Patient Name: Tina Briggs Procedure Date : 08/11/2023 MRN: [id number] Attending MD: Vida Rigger , MD, [id number] Date of Birth: Jul 09, 1946 CSN: [id number] Age: 77 Admit Type: Inpatient Procedure:                ERCP Indications:              Suspected bile duct stone(s), For therapy of                            ascending cholangitis, Elevated liver enzymes Providers:                Vida Rigger, MD, Eliberto Ivory, RN, Kandice Robinsons,                            Technician Referring MD:              Medicines:                General Anesthesia Complications:            No immediate complications. Estimated Blood Loss:     Estimated blood loss: none. Procedure:                Pre-Anesthesia Assessment:                           - Prior to the procedure, a History and Physical                            was performed, and patient medications and                            allergies were reviewed. The patient's tolerance of                            previous anesthesia was also reviewed. The risks                            and benefits of the procedure and the sedation                            options and risks were discussed with the patient.                            All questions were answered, and informed consent                            was obtained. Prior Anticoagulants: The patient has                            taken no anticoagulant or antiplatelet agents. ASA                            Grade Assessment: III - A patient with severe  systemic disease. After reviewing the risks and                            benefits, the patient was deemed in satisfactory                            condition to undergo the procedure.                           After obtaining informed consent, the scope was                            passed under direct vision. Throughout the                            procedure, the patient's blood  pressure, pulse, and                            oxygen saturations were monitored continuously. The                            TJF-Q190V ([id number] Olympus duodenoscope was                            introduced through the mouth, and used to inject                            contrast into and used to cannulate the bile duct.                            The ERCP was somewhat difficult due to abnormal                            anatomy. Successful completion of the procedure was                            aided by performing the maneuvers documented                            (below) in this report. The patient tolerated the                            procedure well. Scope In: Scope Out: Findings:      The major papilla was located partially within a diverticulum. The major       papilla was normal. Deep selective cannulation was fairly readily       obtained and there was no pancreatic duct injection or wire advancement       throughout the procedure and we proceeded with a biliary sphincterotomy       was made with a Hydratome sphincterotome using ERBE electrocautery.       There was no post-sphincterotomy bleeding. We proceeded until we had       adequate biliary drainage and could get the fully bowed sphincterotome       easily in  and out of the duct and to discover objects, the biliary tree       was swept with an adjustable 9- 12 mm balloon starting at the       bifurcation. Sludge was swept from the duct. Many stones were removed.       We did have to lower the 12 mm balloon to 9 mm to pull it through the       patent sphincterotomy site and on multiple balloon pull-through's A few       stones remained as well as sludge. And debris was swept from the duct.       However based on continual sludge being removed and although there were       no obvious stones on occlusion cholangiogram we elected to place one 10       Fr by 7 cm temporary plastic biliary stent with a single external flap        and a single internal flap was placed 6 cm into the common bile duct.       Bile and sludge flowed through the stent. The stent was in good position       at the end of the procedure. Once the stent was placed in order to       evaluate the right system we did reinsert the sphincterotome and advance       it about a third way up the duct and with bowing were able to advance       the wire into the right system and the sphincterotome into the right       system and dye was injected and no significant abnormality was seen in       this side and the wire and the sphincterotome were removed and the scope       was removed and the patient tolerated the procedure well there was no       obvious immediate complications and not mentioned above no obvious       significant stricture was seen that required brushing at this time Impression:               - The major papilla was located partially within a                            diverticulum. She did have other duodenal                            diverticulum                           - The major papilla appeared normal.                           - Choledocholithiasis was found. Partial removal                            was accomplished with biliary sphincterotomy and                            sweeping in the customary fashion; a stent was  inserted.                           - A biliary sphincterotomy was performed.                           - The biliary tree was swept and debris was found                            at the end of the procedure.                           - One temporary plastic biliary stent was placed                            into the common bile duct. Recommendation:           - Clear liquid diet today. If doing great at 6 PM                            could have soft solids for dinner                           - Continue present medications.                           - Return to GI clinic in  2 weeks. Will need repeat                            ERCP possibly with spyglass not only to remove any                            residual stones in the stent but to make sure that                            the stricture is sampled if we can find a stricture                            seen on MRCP                           - Telephone GI clinic if symptomatic PRN.                           - Check liver enzymes (AST, ALT, alkaline                            phosphatase, bilirubin), hemogram with white blood                            cell count and platelets and electrolyte panel in                            the morning. And follow back to normal during  the                            rest of this hospitalization and as an outpatient Procedure Code(s):        --- Professional ---                           [phone], Endoscopic retrograde                            cholangiopancreatography (ERCP); with placement of                            endoscopic stent into biliary or pancreatic duct,                            including pre- and post-dilation and guide wire                            passage, when performed, including sphincterotomy,                            when performed, each stent                           [id number], Endoscopic retrograde                            cholangiopancreatography (ERCP); with removal of                            calculi/debris from biliary/pancreatic duct(s) Diagnosis Code(s):        --- Professional ---                           K80.30, Calculus of bile duct with cholangitis,                            unspecified, without obstruction                           K83.09, Other cholangitis                           R74.8, Abnormal levels of other serum enzymes CPT copyright 2022 American Medical Association. All rights reserved. The codes documented in this report are preliminary and upon coder review may  be revised to meet current compliance  requirements. Vida Rigger, MD 08/11/2023 8:59:27 AM This report has been signed electronically. Number of Addenda: 0

## 2023-08-11 NOTE — Progress Notes (Signed)
[place] Form 7:31 AM  Subjective: Patient doing better than she was with less pain and better mental status no new complaints and her Case discussed with my partner Dr. Lorenso Quarry   Objective: Signs stable afebrile no acute distress exam please see preassessment evaluation only minimal discomfort in her upper abdomen creatinine decreasing no new liver tests white count increased hemoglobin okay platelet count increased status posttransfusion CT reviewed  Assessment: Concerns over CBD stones and proximal stricture  Plan: Okay to proceed with ERCP with anesthesia assistance  [facility]  office [phone] After 5PM or if no answer call [phone]

## 2023-08-11 NOTE — Anesthesia Procedure Notes (Signed)
Procedure Name: Intubation Date/Time: 08/11/2023 7:40 AM  Performed by: Sharyn Dross, CRNAPre-anesthesia Checklist: Patient identified, Emergency Drugs available, Suction available and Patient being monitored Patient Re-evaluated:Patient Re-evaluated prior to induction Oxygen Delivery Method: Circle system utilized Preoxygenation: Pre-oxygenation with 100% oxygen Induction Type: IV induction Ventilation: Mask ventilation without difficulty Laryngoscope Size: 4 and Mac Grade View: Grade I Tube type: Oral Tube size: 7.0 mm Number of attempts: 1 Airway Equipment and Method: Stylet and Oral airway Placement Confirmation: ETT inserted through vocal cords under direct vision, positive ETCO2 and breath sounds checked- equal and bilateral Secured at: 22 cm Tube secured with: Tape Dental Injury: Teeth and Oropharynx as per pre-operative assessment

## 2023-08-11 NOTE — Transfer of Care (Signed)
Immediate Anesthesia Transfer of Care Note  Patient: Tina Briggs  Procedure(s) Performed: ENDOSCOPIC RETROGRADE CHOLANGIOPANCREATOGRAPHY (ERCP) SPHINCTEROTOMY REMOVAL OF STONES BILIARY STENT PLACEMENT  Patient Location: PACU  Anesthesia Type:General  Level of Consciousness: awake, alert , and oriented  Airway & Oxygen Therapy: Patient Spontanous Breathing and Patient connected to face mask oxygen  Post-op Assessment: Report given to RN and Post -op Vital signs reviewed and stable  Post vital signs: Reviewed and stable  Last Vitals:  Vitals Value Taken Time  BP 114/50 08/11/23 0907  Temp 36.2 C 08/11/23 0907  Pulse 71 08/11/23 0909  Resp 17 08/11/23 0909  SpO2 97 % 08/11/23 0909  Vitals shown include unfiled device data.  Last Pain:  Vitals:   08/11/23 0719  TempSrc: Temporal  PainSc: 0-No pain         Complications: There were no known notable events for this encounter.

## 2023-08-11 NOTE — Anesthesia Preprocedure Evaluation (Addendum)
Anesthesia Evaluation  Patient identified by MRN, date of birth, ID band Patient awake    Reviewed: Allergy & Precautions, NPO status , Patient's Chart, lab work & pertinent test results  History of Anesthesia Complications (+) PONV and history of anesthetic complications  Airway Mallampati: II  TM Distance: >3 FB Neck ROM: Full    Dental  (+) Dental Advisory Given   Pulmonary neg pulmonary ROS   Pulmonary exam normal        Cardiovascular hypertension, Normal cardiovascular exam     Neuro/Psych  PSYCHIATRIC DISORDERS Anxiety Depression    negative neurological ROS     GI/Hepatic Neg liver ROS,GERD  ,,Ascending cholangitis Ecoli bacteremia    Endo/Other  diabetes, Type 2, Insulin Dependent    Renal/GU Renal disease (CKD3)  negative genitourinary   Musculoskeletal  (+) Arthritis ,    Abdominal   Peds  Hematology  (+) Blood dyscrasia, anemia   Anesthesia Other Findings   Reproductive/Obstetrics                              Anesthesia Physical Anesthesia Plan  ASA: 3  Anesthesia Plan: General   Post-op Pain Management:    Induction: Intravenous and Rapid sequence  PONV Risk Score and Plan: 3 and Ondansetron, Dexamethasone, Treatment may vary due to age or medical condition and Midazolam  Airway Management Planned: Oral ETT  Additional Equipment: None  Intra-op Plan:   Post-operative Plan: Extubation in OR  Informed Consent: I have reviewed the patients History and Physical, chart, labs and discussed the procedure including the risks, benefits and alternatives for the proposed anesthesia with the patient or authorized representative who has indicated his/her understanding and acceptance.     Dental advisory given  Plan Discussed with:   Anesthesia Plan Comments: (Septic &  hypovolemic shock 2/2 E. coli bacteremia-- suspect based on repeat CT this is ascending  cholangitis, now with pancreatitis; alk phos and bilirubin rising Acute Uncomplicated Sigmoid diverticulitis with severe diarrhea  )         Anesthesia Quick Evaluation

## 2023-08-11 NOTE — Anesthesia Postprocedure Evaluation (Signed)
Anesthesia Post Note  Patient: Tina Briggs  Procedure(s) Performed: ENDOSCOPIC RETROGRADE CHOLANGIOPANCREATOGRAPHY (ERCP) SPHINCTEROTOMY REMOVAL OF STONES BILIARY STENT PLACEMENT     Patient location during evaluation: PACU Anesthesia Type: General Level of consciousness: awake and alert Pain management: pain level controlled Vital Signs Assessment: post-procedure vital signs reviewed and stable Respiratory status: spontaneous breathing, nonlabored ventilation, respiratory function stable and patient connected to nasal cannula oxygen Cardiovascular status: blood pressure returned to baseline and stable Postop Assessment: no apparent nausea or vomiting Anesthetic complications: no   There were no known notable events for this encounter.  Last Vitals:  Vitals:   08/11/23 0907 08/11/23 0922  BP: (!) 114/50 (!) 106/47  Pulse: 71 67  Resp: 18 19  Temp: (!) 36.2 C   SpO2: 96% 93%    Last Pain:  Vitals:   08/11/23 0907  TempSrc:   PainSc: 0-No pain                 Garnett Nation

## 2023-08-11 NOTE — Plan of Care (Signed)

## 2023-08-11 NOTE — Progress Notes (Signed)
PT Cancellation Note  Patient Details Name: Tina Briggs MRN: [id number] DOB: 02-07-1946   Cancelled Treatment:    Reason Eval/Treat Not Completed: Patient at procedure or test/unavailable  Pt off unit for ERCP this morning. Will check back this afternoon as schedule permits.  Berton Mount 08/11/2023, 9:55 AM

## 2023-08-11 NOTE — Progress Notes (Signed)
eLink Physician-Brief Progress Note Patient Name: Tina Briggs DOB: [birth year] MRN: [id number]   Date of Service  08/11/2023  HPI/Events of Note  Bedside RN reports that patient had an US guided IV that was placed to patient's Right Upper Arm- this has since been removed. The reason charted for removal was " drainage." Bedside RN now reporting that patient is complaining of a lot of pain in that upper arm area. She placed a warm compress which seems to be helping;   Camera eval done. RUE looks ok, no swelling or edema. Compared to left Upper extremity. No reddness.  Discussed with RN. Warm compress helping, pulses are ok.  VS stable. S/p ERCP earlier today.    eICU Interventions  Watch for now, if getting swelling, pain worsening to consider [place] of arm in AM.      Intervention Category Intermediate Interventions: Other:;Pain - evaluation and management  Ranee Gosselin 08/11/2023, 8:11 PM  00:19 Bladder scan 237, will re scan again in 4 hrs, straight cath if over 400 only.

## 2023-08-11 NOTE — Progress Notes (Signed)
Physical Therapy Treatment Patient Details Name: Tina Briggs MRN: [id number] DOB: 1946/05/28 Today's Date: 08/11/2023   History of Present Illness 77 y/o woman presented 11/6 with abdominal pain, nausea, and confusion. She was found to have septic shock and bacteremia. ERCP 11/11. PMHx: HTN, HLD, DM, IBS, anxiety, depression.    PT Comments  Tolerated well, reports feeling very tired following ERCP this morning. Agreeable to get OOB up to recliner though which was helpful for her chronic back pain last visit. Min assist for bed mobility and transfers with RW for support. Practiced transfers from bed and recliner with VC for technique/set-up. VSS throughout session on 3L. Reviewed LE exercises which she states she has been performing regularly since our last visit. Denies pain. Patient will continue to benefit from skilled physical therapy services to further improve independence with functional mobility.     If plan is discharge home, recommend the following: A little help with walking and/or transfers;A little help with bathing/dressing/bathroom;Assistance with cooking/housework;Assist for transportation   Can travel by private vehicle        Equipment Recommendations  None recommended by PT    Recommendations for Other Services       Precautions / Restrictions Precautions Precautions: Fall Precaution Comments: monitor BP Restrictions Weight Bearing Restrictions: No     Mobility  Bed Mobility Overal bed mobility: Needs Assistance Bed Mobility: Supine to Sit     Supine to sit: Min assist, HOB elevated, Used rails     General bed mobility comments: Min assit for pt to pull through her UE with PT's hand to rise to EOB. Cues for technique and extra time.    Transfers Overall transfer level: Needs assistance Equipment used: Rolling walker (2 wheels) Transfers: Sit to/from Stand Sit to Stand: Min assist           General transfer comment: CGA to rise from bed,  light min assist to rise from recliner. VC for hand placement and anterior weight shift. Slight posterior lean initially, RW for support. Able to step pivot with min assist for balance and RW control, very slow, needs cues for continous steps to chair.    Ambulation/Gait                   Stairs             Wheelchair Mobility     Tilt Bed    Modified Rankin (Stroke Patients Only)       Balance Overall balance assessment: Needs assistance Sitting-balance support: No upper extremity supported, Feet supported Sitting balance-Leahy Scale: Fair     Standing balance support: Bilateral upper extremity supported Standing balance-Leahy Scale: Poor                              Cognition Arousal: Alert Behavior During Therapy: WFL for tasks assessed/performed Overall Cognitive Status: Within Functional Limits for tasks assessed                                          Exercises Other Exercises Other Exercises: States she has been compliant with HEP: Reviewed ankle pumps, quad sets, and glute squeeze in recliner.    General Comments General comments (skin integrity, edema, etc.): Pre activity: BP 110/58. Post activity in recliner BP 114/62 HR 64 SpO2 97% on 3L, RR 18  Pertinent Vitals/Pain Pain Assessment Pain Assessment: No/denies pain Pain Intervention(s): Monitored during session    Home Living                          Prior Function            PT Goals (current goals can now be found in the care plan section) Acute Rehab PT Goals Patient Stated Goal: Get well PT Goal Formulation: With patient Time For Goal Achievement: 08/23/23 Potential to Achieve Goals: Good Progress towards PT goals: Progressing toward goals    Frequency    Min 1X/week      PT Plan      Co-evaluation              AM-PAC PT "6 Clicks" Mobility   Outcome Measure  Help needed turning from your back to your side while  in a flat bed without using bedrails?: A Little Help needed moving from lying on your back to sitting on the side of a flat bed without using bedrails?: A Little Help needed moving to and from a bed to a chair (including a wheelchair)?: A Little Help needed standing up from a chair using your arms (e.g., wheelchair or bedside chair)?: A Little Help needed to walk in hospital room?: A Little Help needed climbing 3-5 steps with a railing? : A Lot 6 Click Score: 17    End of Session Equipment Utilized During Treatment: Gait belt;Oxygen Activity Tolerance: Patient tolerated treatment well Patient left: in chair;with call bell/phone within reach;with chair alarm set;with family/visitor present Nurse Communication: Mobility status PT Visit Diagnosis: Unsteadiness on feet (R26.81);Muscle weakness (generalized) (M62.81);Difficulty in walking, not elsewhere classified (R26.2)     Time: 1308-6578 PT Time Calculation (min) (ACUTE ONLY): 21 min  Charges:    $Therapeutic Activity: 8-22 mins PT General Charges $$ ACUTE PT VISIT: 1 Visit                     Kathlyn Sacramento, PT, DPT [facility]  Rehabilitation Services Physical Therapist Office: [phone] Website: [email or web address]    Berton Mount 08/11/2023, 3:34 PM

## 2023-08-11 NOTE — TOC Initial Note (Addendum)
Transition of Care [facility]) - Initial/Assessment Note    Patient Details  Name: Tina Briggs MRN: [id number] Date of Birth: 08-17-1946  Transition of Care [facility]) CM/SW Contact:    Harriet Masson, RN Phone Number: 08/11/2023, 4:19 PM  Clinical Narrative:                  Spoke to patient and husband at bedside regarding transition needs.  Patient lives with her husband who can help with transportation.  PT and OT recommended home health. Offered patient home health choice and patient defers to this RNCM to find highly rated home health agency. Cory with [facility] accepted referral. Address, Phone number and PCP verified. TOC will continue to follow for needs.  Need home health PT,OT orders  Expected Discharge Plan: Home w Home Health Services Barriers to Discharge: Continued Medical Work up   Patient Goals and CMS Choice Patient states their goals for this hospitalization and ongoing recovery are:: return home CMS Medicare.gov Compare Post Acute Care list provided to:: Patient Choice offered to / list presented to : Patient      Expected Discharge Plan and Services   Discharge Planning Services: (P) CM Consult Post Acute Care Choice: Home Health Living arrangements for the past 2 months: Single Family Home                           HH Arranged: PT, OT HH Agency: [facility] Date [facility] Agency Contacted: 08/11/23 Time HH Agency Contacted: 1617 Representative spoke with at [facility] Agency: Kandee Keen  Prior Living Arrangements/Services Living arrangements for the past 2 months: Single Family Home Lives with:: Spouse Patient language and need for interpreter reviewed:: Yes Do you feel safe going back to the place where you live?: (P) Yes      Need for Family Participation in Patient Care: Yes (Comment) Care giver support system in place?: Yes (comment)   Criminal Activity/Legal Involvement Pertinent to Current Situation/Hospitalization: No - Comment as  needed  Activities of Daily Living      Permission Sought/Granted                  Emotional Assessment Appearance:: Appears stated age Attitude/Demeanor/Rapport: Gracious Affect (typically observed): Accepting Orientation: : Oriented to Self, Oriented to Place, Oriented to  Time, Oriented to Situation Alcohol / Substance Use: Not Applicable Psych Involvement: No (comment)  Admission diagnosis:  Septic shock (HCC) [A41.9, R65.21] Diarrhea, unspecified type [R19.7] Sepsis, due to unspecified organism, unspecified whether acute organ dysfunction present (HCC) [A41.9] Sepsis (HCC) [A41.9] Patient Active Problem List   Diagnosis Date Noted   Hyperbilirubinemia 08/08/2023   AKI (acute kidney injury) (HCC) 08/08/2023   Septic shock (HCC) 08/06/2023   Sepsis (HCC) 08/06/2023   Diarrhea 08/06/2023   Hypotension 08/06/2023   Acute metabolic encephalopathy 08/06/2023   Acute pain of left knee 12/11/2022   Secondary hyperparathyroidism of renal origin (HCC) 12/11/2022   Neuropathy 05/04/2021   Injury of right hip 05/04/2021   Right eye injury 05/04/2021   Plantar fasciitis 05/04/2021   Colon polyps 05/04/2021   Bacteremia 01/15/2021   Ascending cholangitis 01/12/2021   Sepsis with acute hypoxic respiratory failure and septic shock (HCC) 01/12/2021   Acute encephalopathy 01/12/2021   Thrombocytopenia (HCC) 01/12/2021   Acute respiratory failure with hypoxia (HCC) 01/12/2021   Acute embolism and thrombosis of unspecified deep veins of left lower extremity (HCC) 11/23/2020   Allergic rhinitis 11/23/2020   Anxiety  11/23/2020   Benign neoplasm of stomach 11/23/2020   Cholangiectasis 11/23/2020   Cholelithiasis without obstruction 11/23/2020   Complete fecal incontinence 11/23/2020   Diabetic renal disease (HCC) 11/23/2020   Diverticulitis of colon 11/23/2020   Edema of lower extremity 11/23/2020   Essential tremor 11/23/2020   Fatty liver 11/23/2020   Functional diarrhea  11/23/2020   Hardening of the aorta (main artery of the heart) (HCC) 11/23/2020   History of diverticulitis 11/23/2020   History of iron deficiency 11/23/2020   Hypomagnesemia 11/23/2020   Iron deficiency anemia 11/23/2020   Left lower quadrant pain 11/23/2020   Long term (current) use of insulin (HCC) 11/23/2020   Major depression in complete remission (HCC) 11/23/2020   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   Microscopic hematuria 11/23/2020   Osteoarthritis 11/23/2020   Pain in limb 11/23/2020   History of colonic polyps 11/23/2020   Sciatica 11/23/2020   Unspecified thoracic, thoracolumbar and lumbosacral intervertebral disc disorder 11/23/2020   Vitamin B deficiency 11/23/2020   Low back pain 06/01/2019   Inflammation of sacroiliac joint (HCC) 02/09/2019   Diverticulitis 10/15/2018   S/P lumbar fusion 09/16/2018   Prolapsed lumbar disc 08/31/2018   Pain in left foot 06/06/2018   S/P lumbar laminectomy 12/31/2017   Lumbar radiculopathy 12/09/2017   Neuropathy of peroneal nerve at left knee 12/09/2017   Foot-drop 12/08/2017   Glomerulonephritis 09/30/2017   Elevated serum immunoglobulin free light chain level 08/26/2017   Chronic kidney disease, stage 3 (HCC) 06/07/2015   Choledocholithiasis 06/07/2015   Aspiration pneumonitis (HCC) 06/06/2015   Anemia 06/06/2015   Type 2 diabetes mellitus with obesity (HCC) 05/25/2015   Elevated liver enzymes 05/24/2015   RUQ abdominal pain 05/24/2015   Hypercholesterolemia 10/27/2014   Obesity 02/15/2009   CATARACT, RIGHT EYE 02/15/2009   Essential hypertension 02/15/2009   GERD 02/15/2009   PCP:  [name], MD Pharmacy:   [facility] [address] [place] - [address] [place] [address] Phone: [phone] Fax: [phone]     Social Determinants of Health (SDOH) Social History: SDOH Screenings   Depression (PHQ2-9): Low Risk  (01/25/2021)  Tobacco Use: Low  Risk  (08/11/2023)   SDOH Interventions:     Readmission Risk Interventions     No data to display

## 2023-08-11 NOTE — Progress Notes (Signed)
NAME:  Tina Briggs, MRN:  [id number], DOB:  07-27-1946, LOS: 5 ADMISSION DATE:  08/06/2023, CONSULTATION DATE:  08/06/2023 REFERRING MD:  MCDB, CHIEF COMPLAINT:  Hypotension   History of Present Illness:  Tina Briggs is a 77 y.o. female who has a PMH as below including but not limited to HTN, HLD, DM, IBS, anxiety, depression. He presented to [facility] 11/6 with diarrhea, nausea, confusion x 3 days. Was apparently hypotensive since the day earlier. Diarrhea was water like, non bloody. No recent abx use or exposure to known persons with C.diff. BP soft initially but did respond to fluids; however, lactate climbed. CT abd/pelv was done and suggestive of sigmoid diverticulitis. He was started on Zosyn with continued IVF's.   Due to climbing lactate, PCCM called for transfer to [facility] ICU.  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 Mild to mod TI scarring. (Dr. Eliott Nine)   HNP (herniated nucleus pulposus), lumbar    Recurrent   HYPERLIPIDEMIA-MIXED    HYPERTENSION, UNSPECIFIED    Hypertensive retinopathy    Injury of right hip    Lumbar back pain    Macular degeneration    Microscopic hematuria    Neuropathy    OBESITY    Plantar fasciitis    PONV (postoperative nausea and vomiting)    past history only, "sometimes they put a patch on me"   Right eye injury     Significant Hospital Events: Including procedures, antibiotic start and stop dates in addition to other pertinent events   11/6 admitted to ICU with septic vs  hypovolemic shock and uncomplicated diverticulitis 11/8 started on amiodarone for afib overnight and in the afternoon was off pressors for 5-6 hours 11/9 off pressors again for 5 hours but decompensated  11/11 ERCP with choledocholithiasis, stone removed and stent places  Interim History / Subjective:  Feeling tired but is recovering from ERCP procedure. No acute concerns.    Objective   Blood pressure (!) 108/55, pulse 66, temperature 98.3 F (36.8 C), temperature source Oral, resp. rate 13, height 5\' 3"  (1.6 m), weight 80 kg, SpO2 92%.        Intake/Output Summary (Last 24 hours) at 08/11/2023 0630 Last data filed at 08/11/2023 0600 Gross per 24 hour  Intake 1376.54 ml  Output 800 ml  Net 576.54 ml   Filed Weights   08/09/23 0351 08/10/23 0500 08/11/23 0500  Weight: 79.3 kg 78.6 kg 80 kg    Examination: Constitutional:tired appearing elderly female. In no acute distress. HENT: Normocephalic, atraumatic,  Eyes: Sclera non-icteric, PERRL, EOM intact Cardio:Regular rate and rhythm. 2+ bilateral radial and dorsalis pedis  pulses. Pulm:Clear to auscultation bilaterally. Normal work of breathing on room air. Abdomen: Soft, epigastric tendernes, no rebound, non-distended, positive bowel sounds. OZH:YQMVHQIO for extremity edema. Skin:Warm and dry. Neuro:Alert and oriented x3. No focal deficit noted.  Resolved Hospital Problem list   Acute encephalopathy  Assessment & Plan:  Septic &  hypovolemic shock 2/2 E. coli bacteremia due to ascending cholangitis Acute Uncomplicated Sigmoid diverticulitis with severe diarrhea - has chronic recurrent diverticulitis. C. Diff negative No evidence of UTI S/p ERCP 11/11 with choledocholithiasis, stone removed and biliary stent placed -con't ceftriaxone -clear liquid diet -norepi PRN for MAP >65-- seems to have afternoon decompensations so will remain in ICU today. Probably will improve now with source control  -stress dose steroids started  11/9, stop today    Intrahepatic biliary duct dilation ; evidence of possible gallstone near the pancreatic head -ERCP today as above   AKI on CKD likely 3b - suspect hypovolemia, stable with good UOP Hyponatremia -strict I/O -maintain adequate perfusion; MAP >65 -hold PTA ARB; renally dose meds and avoid nephrotoxic meds   Thrombocytopenia due to sepsis; has not had evidence of DIC -monitor; 2 unit platelets prior to ERCP -still need to hold chemical DVT prophylaxis due to bleeding risk   Afib w/ RVR-resolved, new onset.  -can start [facility] if she has recurrence, but this was likely due to acute illness -monitor on tele -previously on amiodarone for about 1 day, then d/c -can resume PTA metoprolol once stable off pressors   Hx HTN, HLD. -hold PTA antihypertensives   At risk for malnutrition -will monitor diet today on clears   Hx DM- A1c 5.9 Hypoglycemia -hold PTA semaglutide, farxiga,  -SSI PRN -goal BG 140-180   Hx Anxiety, Depression -con't PTA effexor, reduced dose gabapentin   HLD -ok to con't statin  If stable off pressors through the night then she is stable for transfer to med tele.  Best Practice (right click and "Reselect all SmartList Selections" daily)   Diet/type: clear liquids DVT prophylaxis: SCD GI prophylaxis: H2B Lines: N/A Foley:  N/A Code Status:  full code Last date of multidisciplinary goals of care discussion [08/11/2023]  Labs   CBC: Recent Labs  Lab 08/06/23 1310 08/06/23 2012 08/07/23 1211 08/08/23 0259 08/08/23 1113 08/09/23 0630 08/09/23 0631 08/10/23 0232 08/11/23 0436  WBC 22.4*  --  7.1 8.4  --   --  7.1 5.4 13.8*  NEUTROABS 19.2*  --   --   --   --   --   --   --   --   HGB 12.2   < > 10.3* 9.9*  --   --  9.0* 9.7* 10.0*  HCT 36.1   < > 29.6* 29.1*  --   --  26.2* 28.9* 29.2*  MCV 88.3  --  85.8 85.8  --   --  85.6 86.5 86.6  PLT 91*  --  47* 34* 28* 28* 28* 34* 80*   < > = values in this interval not displayed.     Basic Metabolic Panel: Recent Labs  Lab 08/06/23 2118 08/07/23 1211 08/08/23 0259 08/09/23 0631 08/10/23 0232 08/11/23 0436  NA 135 133* 134* 132* 132* 130*  K 4.2 3.7 3.7 3.4* 4.1 3.9  CL 105 104 103 103 104 102  CO2 19* 18* 20* 20* 18* 18*  GLUCOSE 156* 148* 131* 142* 240* 215*  BUN 45* 48* 48* 48* 52* 53*  CREATININE 3.12* 3.25* 3.65* 3.35* 2.86* 2.50*  CALCIUM 7.7* 8.0* 7.6* 7.6* 7.8* 7.6*  MG 1.0* 3.0* 2.9*  --   --   --   PHOS  --  2.8 4.0  --   --   --    GFR: Estimated Creatinine Clearance: 19.2 mL/min (A) (by C-G formula based on SCr of 2.5  mg/dL (H)). Recent Labs  Lab 08/06/23 1310 08/06/23 1424 08/06/23 1823 08/06/23 2118 08/07/23 1211 08/08/23 0259 08/09/23 0631 08/10/23 0232 08/11/23 0436  PROCALCITON  --   --   --  58.91  --   --   --   --   --   WBC 22.4*  --   --   --    < > 8.4 7.1 5.4 13.8*  LATICACIDVEN 4.6* 6.8* 3.7*  --   --   --   --   --   --    < > = values in this interval not displayed.    Liver Function Tests: Recent Labs  Lab 08/06/23 1310 08/07/23 1211 08/08/23 0259 08/10/23 0232  AST 142* 56* 35 28  ALT 230* 127* 98* 56*  ALKPHOS 80 133* 133* 248*  BILITOT 1.6* 2.1* 2.9* 5.0*  PROT 6.5 4.9* 5.1* 5.5*  ALBUMIN 3.7 2.2* 2.1* 1.8*   Recent Labs  Lab 08/07/23 1211  LIPASE 32   Recent Labs  Lab 08/06/23 2118  AMMONIA <10    ABG    Component Value Date/Time   PHART 7.389 08/06/2023 2012   PCO2ART 22.7 (L) 08/06/2023 2012   PO2ART 68 (L) 08/06/2023 2012   HCO3 13.3 (L) 08/06/2023 2012   TCO2 14 (L) 08/06/2023 2012   ACIDBASEDEF 9.0 (H) 08/06/2023 2012   O2SAT 90 08/06/2023 2012     Coagulation Profile: Recent Labs  Lab 08/06/23 1310 08/08/23 1113 08/09/23 0630  INR 1.4* 1.3* 1.2    Cardiac Enzymes: No results for input(s): "CKTOTAL", "CKMB", "CKMBINDEX", "TROPONINI" in the last 168 hours.  HbA1C: Hgb A1c MFr Bld  Date/Time Value Ref Range Status  08/06/2023 09:18 PM 5.9 (H) 4.8 - 5.6 % Final     Comment:    (NOTE) Pre diabetes:          5.7%-6.4%  Diabetes:              >6.4%  Glycemic control for   <7.0% adults with diabetes   01/13/2021 02:54 AM 6.7 (H) 4.8 - 5.6 % Final    Comment:    (NOTE)         Prediabetes: 5.7 - 6.4         Diabetes: >6.4         Glycemic control for adults with diabetes: <7.0     CBG: Recent Labs  Lab 08/10/23 1104 08/10/23 1534 08/10/23 1924 08/10/23 2317 08/11/23 0329  GLUCAP 262* 218* 227* 235* 204*    Review of Systems:   Epigastric abdominal pain, fatigue.   Past Medical History:  She,  has a past medical history of Allergic rhinitis, Anemia, Anxiety, Aspiration pneumonia (HCC), CATARACT, RIGHT EYE, Colon polyps, Depression, Diverticulitis, DM, Dyslipidemia, Elevated alkaline phosphatase level, GERD, GERD (gastroesophageal reflux disease), Glomerulonephritis (11/18/2017), HNP (herniated nucleus pulposus), lumbar, HYPERLIPIDEMIA-MIXED, HYPERTENSION, UNSPECIFIED, Hypertensive retinopathy, Injury of right hip, Lumbar back pain, Macular degeneration, Microscopic hematuria, Neuropathy, OBESITY, Plantar fasciitis, PONV (postoperative nausea and vomiting), and Right eye injury.   Surgical History:   Past Surgical History:  Procedure Laterality Date   ABDOMINAL HYSTERECTOMY     APPENDECTOMY     BACK SURGERY     lumbar '09"microdiscectomy"   BREAST BIOPSY     BREAST BIOPSY Right 05/19/2023   MM RT BREAST BX W LOC DEV EA AD LESION IMG BX SPEC STEREO GUIDE 05/19/2023 [facility]   BREAST BIOPSY Right 05/19/2023   MM RT BREAST BX W LOC DEV 1ST LESION  IMAGE BX SPEC STEREO GUIDE 05/19/2023 [facility]   breat Bx benign     CHOLECYSTECTOMY     open   COLONOSCOPY WITH PROPOFOL N/A 11/22/2014   Procedure: COLONOSCOPY WITH PROPOFOL;  Surgeon: Charolett Bumpers, MD;  Location: [facility];  Service: Endoscopy;  Laterality: N/A;   ERCP N/A 05/26/2015   Procedure: ENDOSCOPIC RETROGRADE CHOLANGIOPANCREATOGRAPHY (ERCP)   (DOING CASE IN  MAIN OR);  Surgeon: Dorena Cookey, MD;  Location: [facility];  Service: Gastroenterology;  Laterality: N/A;   ERCP N/A 06/06/2015   Procedure: ENDOSCOPIC RETROGRADE CHOLANGIOPANCREATOGRAPHY (ERCP);  Surgeon: Vida Rigger, MD;  Location: [facility];  Service: Endoscopy;  Laterality: N/A;   ERCP N/A 01/12/2021   Procedure: ENDOSCOPIC RETROGRADE CHOLANGIOPANCREATOGRAPHY (ERCP);  Surgeon: Vida Rigger, MD;  Location: [facility];  Service: Endoscopy;  Laterality: N/A;   ESOPHAGOGASTRODUODENOSCOPY (EGD) WITH PROPOFOL N/A 11/22/2014   Procedure: ESOPHAGOGASTRODUODENOSCOPY (EGD) WITH PROPOFOL;  Surgeon: Charolett Bumpers, MD;  Location: [facility] ENDOSCOPY;  Service: Endoscopy;  Laterality: N/A;   EYE SURGERY Bilateral    cataracts   GANGLION CYST EXCISION Right    LIPOMA EXCISION  1993   From thigh 1993   LUMBAR FUSION  09/16/2018   LUMBAR LAMINECTOMY/DECOMPRESSION MICRODISCECTOMY Left 12/31/2017   Procedure: Laminectomy and Foraminotomy - Lumbar Three-Four - left Extraforaminal diskectomy Lumbar Four-Five left;  Surgeon: Tia Alert, MD;  [facility];  Service: Neurosurgery;  Laterality: Left;   PERONEAL NERVE DECOMPRESSION Left 12/31/2017   Procedure: Left PERONEAL NERVE DECOMPRESSION;  Surgeon: Tia Alert, MD;  Location: [facility] OR;  Service: Neurosurgery;  Laterality: Left;  left   PERONEAL NERVE DECOMPRESSION     REMOVAL OF STONES  01/12/2021   Procedure: REMOVAL OF STONES;  Surgeon: Vida Rigger, MD;  Location: [facility] ENDOSCOPY;  Service: Endoscopy;;   Right ganglion cyst     Right ganglion cyst remove     TUBAL LIGATION       Social History:   reports that she has never smoked. She has never used smokeless tobacco. She reports that she does not drink alcohol and does not use drugs.   Family History:  Her family history includes Alzheimer's disease in her father; Cancer in her maternal aunt, maternal grandmother, and paternal grandmother; Diabetes Mellitus II in her brother; Healthy in her son  and son; Heart disease in her mother; Tremor in her father.   Allergies Allergies  Allergen Reactions   Codeine Shortness Of Breath   Clavulanic Acid     Pt tolerated Zosyn 11/6   Flagyl [Metronidazole]    Other Other (See Comments)   Prevacid [Lansoprazole]    Tetracyclines & Related Itching   Trulicity [Dulaglutide]    Azithromycin Itching and Rash   Erythromycin Itching and Rash   Morphine And Codeine Itching and Rash   Septra [Sulfamethoxazole-Trimethoprim] Itching and Rash     Home Medications  Prior to Admission medications   Medication Sig Start Date End Date Taking? Authorizing Provider  acetaminophen (TYLENOL) 500 MG tablet Take 1,000 mg by mouth 2 (two) times daily as needed for moderate pain or headache.    Yes [provider]  cholecalciferol (VITAMIN D) 1000 units tablet Take 5,000 Units by mouth daily.   Yes [provider]  colestipol (COLESTID) 1 g tablet Take 2 tablets (2 g total) by mouth in the morning and at bedtime. Patient taking differently: Take 2 g by mouth in the morning and at bedtime. *usually takes 1 qam and 2 qpm* 08/21/22  Yes Celso Amy, PA-C  CRESTOR 40 MG tablet TAKE 1 TABLET AT BEDTIME Patient taking differently: Take 40 mg by mouth at bedtime. 04/19/13  Yes Wall, Jesse Sans, MD  dapagliflozin propanediol (FARXIGA) 5 MG TABS tablet Take 5 mg by mouth daily.   Yes [provider]  desloratadine (CLARINEX) 5 MG tablet Take 1 tablet (5 mg total) by mouth daily. 11/21/22  Yes [name], MD  diclofenac Sodium (VOLTAREN) 1 % GEL Apply 4 g topically 4 (four) times daily. 11/03/21  Yes Melene Plan, DO  fluticasone (FLONASE) 50 MCG/ACT nasal spray Place 2 sprays into both nostrils daily as needed for allergies.    Yes [provider]  gabapentin (NEURONTIN) 300 MG capsule Take 300-600 mg by mouth See admin instructions. 300mg  in the morning 600mg  at night 10/26/18  Yes [provider]  losartan (COZAAR) 50 MG  tablet Take 1 tablet (50 mg total) by mouth 2 (two) times daily. 05/09/23  Yes   Multiple Vitamins-Minerals (PRESERVISION AREDS 2 PO) Take 1 tablet by mouth 2 (two) times daily.   Yes [provider]  NOVOLOG FLEXPEN RELION 100 UNIT/ML FlexPen Inject 8-10 Units into the skin 3 (three) times daily as needed. BS 12/05/20  Yes [provider]  rosuvastatin (CRESTOR) 40 MG tablet Take 1 tablet (40 mg total) by mouth at bedtime. 08/15/22  Yes [name], MD  Semaglutide,0.25 or 0.5MG /DOS, (OZEMPIC, 0.25 OR 0.5 MG/DOSE,) 2 MG/3ML SOPN Inject 0.5 mg into the skin once a week. 04/01/23  Yes   venlafaxine XR (EFFEXOR-XR) 75 MG 24 hr capsule Take 75 mg by mouth every morning.    Yes [provider]  vitamin B-12 (CYANOCOBALAMIN) 1000 MCG tablet Take 1,000 mcg by mouth 3 (three) times a week. Take on Sundays, Wednesdays and Fridays.   Yes [provider]  cephALEXin (KEFLEX) 500 MG capsule Take 1 capsule (500 mg total) by mouth 3 (three) times daily. Patient not taking: Reported on 08/06/2023 05/14/23   Lenn Sink, DPM  FREESTYLE LITE test strip USE 1 STRIP TO CHECK GLUCOSE 4 TIMES DAILY 12/04/20   [provider]  methocarbamol (ROBAXIN) 500 MG tablet Take 1 tablet (500 mg total) by mouth 2 (two) times daily. Patient not taking: Reported on 08/06/2023 11/03/21   Melene Plan, DO  methylPREDNISolone (MEDROL DOSEPAK) 4 MG TBPK tablet Day 1: 8mg  before breakfast, 4 mg after lunch, 4 mg after supper, and 8 mg at bedtime Day 2: 4 mg before breakfast, 4 mg after lunch, 4 mg  after supper, and 8 mg  at bedtime Day 3:  4 mg  before breakfast, 4 mg  after lunch, 4 mg after supper, and 4 mg  at bedtime Day 4: 4 mg  before breakfast, 4 mg  after lunch, and 4 mg at bedtime Day 5: 4 mg  before breakfast and 4 mg at bedtime Day 6: 4 mg  before breakfast Patient not taking: Reported on 08/06/2023 11/03/21   Melene Plan, DO  metoprolol tartrate (LOPRESSOR) 100 MG tablet Take one tablet by  mouth 2 hours prior to your CT Patient not taking: Reported on 08/06/2023 05/08/21   Lyn Records, MD     Critical care time: 63    Rocky Morel, DO Internal Medicine Resident, PGY-2 Pager# [phone] [facility] 08/11/2023 6:30 AM  For contact information, see Amion. If no response to pager, please call PCCM consult pager. After hours, 7PM- 7AM, please call Elink.

## 2023-08-12 ENCOUNTER — Inpatient Hospital Stay (HOSPITAL_COMMUNITY): Payer: PPO

## 2023-08-12 DIAGNOSIS — M7989 Other specified soft tissue disorders: Secondary | ICD-10-CM | POA: Diagnosis not present

## 2023-08-12 DIAGNOSIS — A419 Sepsis, unspecified organism: Secondary | ICD-10-CM | POA: Diagnosis not present

## 2023-08-12 DIAGNOSIS — R6521 Severe sepsis with septic shock: Secondary | ICD-10-CM | POA: Diagnosis not present

## 2023-08-12 LAB — CBC WITH DIFFERENTIAL/PLATELET
Abs Immature Granulocytes: 0.28 10*3/uL — ABNORMAL HIGH (ref 0.00–0.07)
Basophils Absolute: 0 10*3/uL (ref 0.0–0.1)
Basophils Relative: 0 %
Eosinophils Absolute: 0 10*3/uL (ref 0.0–0.5)
Eosinophils Relative: 0 %
HCT: 26.6 % — ABNORMAL LOW (ref 36.0–46.0)
Hemoglobin: 9.1 g/dL — ABNORMAL LOW (ref 12.0–15.0)
Immature Granulocytes: 3 %
Lymphocytes Relative: 6 %
Lymphs Abs: 0.5 10*3/uL — ABNORMAL LOW (ref 0.7–4.0)
MCH: 30.2 pg (ref 26.0–34.0)
MCHC: 34.2 g/dL (ref 30.0–36.0)
MCV: 88.4 fL (ref 80.0–100.0)
Monocytes Absolute: 0.6 10*3/uL (ref 0.1–1.0)
Monocytes Relative: 7 %
Neutro Abs: 7.5 10*3/uL (ref 1.7–7.7)
Neutrophils Relative %: 84 %
Platelets: 117 10*3/uL — ABNORMAL LOW (ref 150–400)
RBC: 3.01 MIL/uL — ABNORMAL LOW (ref 3.87–5.11)
RDW: 15 % (ref 11.5–15.5)
WBC: 8.9 10*3/uL (ref 4.0–10.5)
nRBC: 0 % (ref 0.0–0.2)

## 2023-08-12 LAB — BASIC METABOLIC PANEL
Anion gap: 10 (ref 5–15)
BUN: 69 mg/dL — ABNORMAL HIGH (ref 8–23)
CO2: 21 mmol/L — ABNORMAL LOW (ref 22–32)
Calcium: 7 mg/dL — ABNORMAL LOW (ref 8.9–10.3)
Chloride: 99 mmol/L (ref 98–111)
Creatinine, Ser: 3.45 mg/dL — ABNORMAL HIGH (ref 0.44–1.00)
GFR, Estimated: 13 mL/min — ABNORMAL LOW (ref >60)
Glucose, Bld: 160 mg/dL — ABNORMAL HIGH (ref 70–99)
Potassium: 3.7 mmol/L (ref 3.5–5.1)
Sodium: 130 mmol/L — ABNORMAL LOW (ref 135–145)

## 2023-08-12 LAB — BPAM PLATELET PHERESIS
Blood Product Expiration Date: 202411122359
ISSUE DATE / TIME: 202411110157
Unit Type and Rh: 5100

## 2023-08-12 LAB — HEPATIC FUNCTION PANEL
ALT: 35 U/L (ref 0–44)
AST: 27 U/L (ref 15–41)
Albumin: <1.5 g/dL — ABNORMAL LOW (ref 3.5–5.0)
Alkaline Phosphatase: 232 U/L — ABNORMAL HIGH (ref 38–126)
Bilirubin, Direct: 0.9 mg/dL — ABNORMAL HIGH (ref 0.0–0.2)
Indirect Bilirubin: 0.9 mg/dL (ref 0.3–0.9)
Total Bilirubin: 1.8 mg/dL — ABNORMAL HIGH (ref <1.2)
Total Protein: 4.6 g/dL — ABNORMAL LOW (ref 6.5–8.1)

## 2023-08-12 LAB — GLUCOSE, CAPILLARY
Glucose-Capillary: 133 mg/dL — ABNORMAL HIGH (ref 70–99)
Glucose-Capillary: 123 mg/dL — ABNORMAL HIGH (ref 70–99)
Glucose-Capillary: 139 mg/dL — ABNORMAL HIGH (ref 70–99)
Glucose-Capillary: 95 mg/dL (ref 70–99)

## 2023-08-12 LAB — PREPARE PLATELET PHERESIS: Unit division: 0

## 2023-08-12 LAB — ABO/RH: ABO/RH(D): B NEG

## 2023-08-12 MED ORDER — POLYETHYLENE GLYCOL 3350 17 G PO PACK
17.0000 g | PACK | Freq: Every day | ORAL | Status: DC
Start: 2023-08-12 — End: 2023-08-15
  Administered 2023-08-12 – 2023-08-14 (×3): 17 g via ORAL
  Filled 2023-08-12 (×4): qty 1

## 2023-08-12 MED ORDER — HEPARIN SODIUM (PORCINE) 5000 UNIT/ML IJ SOLN
5000.0000 [IU] | Freq: Three times a day (TID) | INTRAMUSCULAR | Status: DC
  Administered 2023-08-12 – 2023-08-19 (×21): 5000 [IU] via SUBCUTANEOUS
  Filled 2023-08-12 (×21): qty 1

## 2023-08-12 MED ORDER — ONDANSETRON HCL 4 MG/2ML IJ SOLN
4.0000 mg | Freq: Once | INTRAMUSCULAR | Status: AC
  Administered 2023-08-12: 4 mg via INTRAVENOUS
  Filled 2023-08-12: qty 2

## 2023-08-12 MED ORDER — LACTATED RINGERS IV SOLN: INTRAVENOUS | Status: DC

## 2023-08-12 NOTE — Progress Notes (Signed)
[facility] Gastroenterology Progress Note  Tina Briggs 77 y.o. [birth year]  CC: Choledocholithiasis, ascending cholangitis, abnormal MRI   Subjective: Patient seen and examined at bedside.  Underwent ERCP with sphincterotomy with stone extraction as well as temporary plastic biliary stent placement yesterday.  She is feeling better today.  Denies any blood in the stool or black stool.  Denies abdominal pain.  ROS : Afebrile, negative for chest pain   Objective: Vital signs in last 24 hours: Vitals:   08/12/23 0645 08/12/23 0750  BP: 114/62   Pulse: (!) 56   Resp: (!) 9   Temp:  (!) 97.5 F (36.4 C)  SpO2: 96%     Physical Exam: Resting comfortably in the bed, not in acute distress.  Abdominal exam benign.  Abdomen is soft, nontender, nondistended, bowel sound present.  No peritoneal signs.  She is alert and oriented x 3.  Lab Results: Recent Labs    08/11/23 0436 08/12/23 0327  NA 130* 130*  K 3.9 3.7  CL 102 99  CO2 18* 21*  GLUCOSE 215* 160*  BUN 53* 69*  CREATININE 2.50* 3.45*  CALCIUM 7.6* 7.0*   Recent Labs    08/10/23 0232  AST 28  ALT 56*  ALKPHOS 248*  BILITOT 5.0*  PROT 5.5*  ALBUMIN 1.8*   Recent Labs    08/11/23 0436 08/12/23 0836  WBC 13.8* 8.9  NEUTROABS  --  7.5  HGB 10.0* 9.1*  HCT 29.2* 26.6*  MCV 86.6 88.4  PLT 80* 117*   No results for input(s): "LABPROT", "INR" in the last 72 hours.    Assessment/Plan: -Choledocholithiasis with possible biliary stricture on MRI MRCP.  Status post ERCP with sphincterotomy with stone extraction as well as temporary CBD plastic stent placement yesterday. -Abnormal MRI MRCP concerning for biliary stricture. -E. coli and Enterobacter bacteremia with sepsis ascending cholangitis -Thrombocytopenia.  Improving.  Recommendations -------------------------- -LFTs pending this morning. -Continue supportive care. -Patient will likely need repeat ERCP with spyglass for further evaluation of possible  remaining CBD stone as well as for evaluation of stricture seen on MRI -No further inpatient GI workup planned.  GI will sign off.  Call us back if needed.  Dr. Ewing Schlein will arrange for repeat ERCP as an outpatient basis.   Kathi Der MD, FACP 08/12/2023, 9:35 AM  Contact #  [phone]

## 2023-08-12 NOTE — Plan of Care (Signed)

## 2023-08-12 NOTE — Progress Notes (Addendum)
NAME:  Tina Briggs, MRN:  [id number], DOB:  October 11, 1945, LOS: 6 ADMISSION DATE:  08/06/2023, CONSULTATION DATE:  08/06/2023 REFERRING MD:  MCDB, CHIEF COMPLAINT:  Hypotension   History of Present Illness:  Tina Briggs is a 77 y.o. female who has a PMH as below including but not limited to HTN, HLD, DM, IBS, anxiety, depression. He presented to [facility] 11/6 with diarrhea, nausea, confusion x 3 days. Was apparently hypotensive since the day earlier. Diarrhea was water like, non bloody. No recent abx use or exposure to known persons with C.diff. BP soft initially but did respond to fluids; however, lactate climbed. CT abd/pelv was done and suggestive of sigmoid diverticulitis. He was started on Zosyn with continued IVF's.   Due to climbing lactate, PCCM called for transfer to [facility] ICU.  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 Mild to mod TI scarring. (Dr. Eliott Nine)   HNP (herniated nucleus pulposus), lumbar    Recurrent   HYPERLIPIDEMIA-MIXED    HYPERTENSION, UNSPECIFIED    Hypertensive retinopathy    Injury of right hip    Lumbar back pain    Macular degeneration    Microscopic hematuria    Neuropathy    OBESITY    Plantar fasciitis    PONV (postoperative nausea and vomiting)    past history only, "sometimes they put a patch on me"   Right eye injury     Significant Hospital Events: Including procedures, antibiotic start and stop dates in addition to other pertinent events   11/6 admitted to ICU with septic vs  hypovolemic shock and uncomplicated diverticulitis 11/8 started on amiodarone for afib overnight and in the afternoon was off pressors for 5-6 hours 11/9 off pressors again for 5 hours but decompensated  11/11 ERCP with choledocholithiasis, stone removed and stent places  Interim History / Subjective:  Feeling much better this morning and eating some. No more abdominal pain.  Objective   Blood pressure (!) 98/56, pulse (!) 55, temperature 97.6 F (36.4 C), temperature source Oral, resp. rate 10, height 5\' 3"  (1.6 m), weight 80 kg, SpO2 95%.        Intake/Output Summary (Last 24 hours) at 08/12/2023 0618 Last data filed at 08/12/2023 0600 Gross per 24 hour  Intake 2989.61 ml  Output 0 ml  Net 2989.61 ml   Filed Weights   08/09/23 0351 08/10/23 0500 08/11/23 0500  Weight: 79.3 kg 78.6 kg 80 kg    Examination: Constitutional:tired appearing elderly female. In no acute distress. HENT: Normocephalic, atraumatic,  Eyes: Sclera non-icteric, PERRL, EOM intact Cardio:Regular rate and rhythm. 2+ bilateral radial and dorsalis pedis  pulses. Pulm:Clear to auscultation bilaterally. Normal work of breathing on room air. Abdomen: Soft, non-tender, no rebound, non-distended, positive bowel sounds. NWG:NFAOZHYQ for extremity edema. Skin:Warm and dry. Neuro:Alert and oriented x3. No focal deficit noted.  Resolved Hospital Problem list   Acute encephalopathy  Assessment & Plan:  Septic &  hypovolemic shock 2/2 E. coli bacteremia due to ascending cholangitis Acute Uncomplicated Sigmoid diverticulitis with severe diarrhea - has chronic recurrent diverticulitis. C. Diff negative No evidence of UTI S/p ERCP 11/11 with choledocholithiasis, stone removed and biliary stent placed -con't ceftriaxone until 11/18 -soft diet AAT   Intrahepatic biliary duct dilation ; evidence of possible gallstone near the pancreatic head   AKI on CKD likely 3b -decreased UOP after ERCP with Cr bump from 2.5 to  3.5. BS with 200+ in bladder Hyponatremia -strict I/O -hold PTA ARB; renally dose meds and avoid nephrotoxic meds   Thrombocytopenia due to sepsis; has not had evidence of DIC -resume dvt ppx  RUE swelling Occurred overnight, mildly tender with transition at the axilla. No pressors running in that arm. ?extravasation of IVF. Will get UE doppler and continue warm compress/elevation.   Afib w/ RVR-resolved, new onset.  -can start AC if she has recurrence, but this was likely due to acute illness -monitor on tele -previously on amiodarone for about 1 day, then d/c -can resume PTA metoprolol once stable off pressors   Hx HTN, HLD. -hold PTA antihypertensives   At risk for malnutrition -will monitor diet   Hx DM- A1c 5.9 Hypoglycemia -hold PTA semaglutide, farxiga,  -SSI PRN -goal BG 140-180   Hx Anxiety, Depression -con't PTA effexor, reduced dose gabapentin   HLD -ok to con't statin  Best Practice (right click and "Reselect all SmartList Selections" daily)   Diet/type: clear liquids DVT prophylaxis: SCD GI prophylaxis: H2B Lines: N/A Foley:  N/A Code Status:  full code Last date of multidisciplinary goals of care discussion [08/12/2023]  Labs   CBC: Recent Labs  Lab 08/06/23 1310 08/06/23 2012 08/07/23 1211 08/08/23 0259 08/08/23 1113 08/09/23 0630 08/09/23 0631 08/10/23 0232 08/11/23 0436  WBC 22.4*  --  7.1 8.4  --   --  7.1 5.4 13.8*  NEUTROABS 19.2*  --   --   --   --   --   --   --   --   HGB 12.2   < > 10.3* 9.9*  --   --  9.0* 9.7* 10.0*  HCT 36.1   < > 29.6* 29.1*  --   --  26.2* 28.9* 29.2*  MCV 88.3  --  85.8 85.8  --   --  85.6 86.5 86.6  PLT 91*  --  47* 34* 28* 28* 28* 34* 80*   < > = values in this interval not displayed.    Basic Metabolic Panel: Recent Labs  Lab 08/06/23 2118 08/07/23 1211 08/08/23 0259 08/09/23 0631 08/10/23 0232 08/11/23 0436 08/12/23 0327  NA 135 133* 134* 132* 132* 130* 130*  K 4.2 3.7 3.7 3.4* 4.1 3.9 3.7   CL 105 104 103 103 104 102 99  CO2 19* 18* 20* 20* 18* 18* 21*  GLUCOSE 156* 148* 131* 142* 240* 215* 160*  BUN 45* 48* 48* 48* 52* 53* 69*  CREATININE 3.12* 3.25* 3.65* 3.35* 2.86* 2.50* 3.45*  CALCIUM 7.7* 8.0* 7.6* 7.6* 7.8* 7.6* 7.0*  MG 1.0* 3.0* 2.9*  --   --   --   --   PHOS  --  2.8 4.0  --   --   --   --    GFR: Estimated Creatinine Clearance: 13.9 mL/min (A) (by C-G formula based on SCr of 3.45 mg/dL (H)). Recent Labs  Lab 08/06/23 1310 08/06/23 1424 08/06/23 1823 08/06/23 2118 08/07/23 1211 08/08/23 0259 08/09/23 0631 08/10/23 0232 08/11/23  6  PROCALCITON  --   --   --  58.91  --   --   --   --   --   WBC 22.4*  --   --   --    < > 8.4 7.1 5.4 13.8*  LATICACIDVEN 4.6* 6.8* 3.7*  --   --   --   --   --   --    < > = values in this interval not displayed.    Liver Function Tests: Recent Labs  Lab 08/06/23 1310 08/07/23 1211 08/08/23 0259 08/10/23 0232  AST 142* 56* 35 28  ALT 230* 127* 98* 56*  ALKPHOS 80 133* 133* 248*  BILITOT 1.6* 2.1* 2.9* 5.0*  PROT 6.5 4.9* 5.1* 5.5*  ALBUMIN 3.7 2.2* 2.1* 1.8*   Recent Labs  Lab 08/07/23 1211  LIPASE 32   Recent Labs  Lab 08/06/23 2118  AMMONIA <10    ABG    Component Value Date/Time   PHART 7.389 08/06/2023 2012   PCO2ART 22.7 (L) 08/06/2023 2012   PO2ART 68 (L) 08/06/2023 2012   HCO3 13.3 (L) 08/06/2023 2012   TCO2 14 (L) 08/06/2023 2012   ACIDBASEDEF 9.0 (H) 08/06/2023 2012   O2SAT 90 08/06/2023 2012     Coagulation Profile: Recent Labs  Lab 08/06/23 1310 08/08/23 1113 08/09/23 0630  INR 1.4* 1.3* 1.2    Cardiac Enzymes: No results for input(s): "CKTOTAL", "CKMB", "CKMBINDEX", "TROPONINI" in the last 168 hours.  HbA1C: Hgb A1c MFr Bld  Date/Time Value Ref Range Status  08/06/2023 09:18 PM 5.9 (H) 4.8 - 5.6 % Final    Comment:    (NOTE) Pre diabetes:          5.7%-6.4%  Diabetes:              >6.4%  Glycemic control for   <7.0% adults with diabetes   01/13/2021 02:54 AM  6.7 (H) 4.8 - 5.6 % Final    Comment:    (NOTE)         Prediabetes: 5.7 - 6.4         Diabetes: >6.4         Glycemic control for adults with diabetes: <7.0     CBG: Recent Labs  Lab 08/11/23 0329 08/11/23 0908 08/11/23 1120 08/11/23 1535 08/11/23 2127  GLUCAP 204* 185* 195* 240* 227*    Review of Systems:   Mild nausea. No abd pain.  Past Medical History:  She,  has a past medical history of Allergic rhinitis, Anemia, Anxiety, Aspiration pneumonia (HCC), CATARACT, RIGHT EYE, Colon polyps, Depression, Diverticulitis, DM, Dyslipidemia, Elevated alkaline phosphatase level, GERD, GERD (gastroesophageal reflux disease), Glomerulonephritis (11/18/2017), HNP (herniated nucleus pulposus), lumbar, HYPERLIPIDEMIA-MIXED, HYPERTENSION, UNSPECIFIED, Hypertensive retinopathy, Injury of right hip, Lumbar back pain, Macular degeneration, Microscopic hematuria, Neuropathy, OBESITY, Plantar fasciitis, PONV (postoperative nausea and vomiting), and Right eye injury.   Surgical History:   Past Surgical History:  Procedure Laterality Date   ABDOMINAL HYSTERECTOMY     APPENDECTOMY     BACK SURGERY     lumbar '09"microdiscectomy"   BREAST BIOPSY     BREAST BIOPSY Right 05/19/2023   MM RT BREAST BX W LOC DEV EA AD LESION IMG BX SPEC STEREO GUIDE 05/19/2023 [facility]   BREAST BIOPSY Right 05/19/2023   MM RT BREAST BX W LOC DEV 1ST LESION IMAGE BX SPEC STEREO GUIDE 05/19/2023 [facility]   breat Bx benign     CHOLECYSTECTOMY     open  COLONOSCOPY WITH PROPOFOL N/A 11/22/2014   Procedure: COLONOSCOPY WITH PROPOFOL;  Surgeon: Charolett Bumpers, MD;  Location: [facility];  Service: Endoscopy;  Laterality: N/A;   ERCP N/A 05/26/2015   Procedure: ENDOSCOPIC RETROGRADE CHOLANGIOPANCREATOGRAPHY (ERCP)   (DOING CASE IN MAIN OR);  Surgeon: Dorena Cookey, MD;  Location: [facility];  Service: Gastroenterology;  Laterality: N/A;   ERCP N/A 06/06/2015   Procedure: ENDOSCOPIC RETROGRADE  CHOLANGIOPANCREATOGRAPHY (ERCP);  Surgeon: Vida Rigger, MD;  Location: [facility];  Service: Endoscopy;  Laterality: N/A;   ERCP N/A 01/12/2021   Procedure: ENDOSCOPIC RETROGRADE CHOLANGIOPANCREATOGRAPHY (ERCP);  Surgeon: Vida Rigger, MD;  Location: [facility];  Service: Endoscopy;  Laterality: N/A;   ESOPHAGOGASTRODUODENOSCOPY (EGD) WITH PROPOFOL N/A 11/22/2014   Procedure: ESOPHAGOGASTRODUODENOSCOPY (EGD) WITH PROPOFOL;  Surgeon: Charolett Bumpers, MD;  Location: [facility];  Service: Endoscopy;  Laterality: N/A;   EYE SURGERY Bilateral    cataracts   GANGLION CYST EXCISION Right    LIPOMA EXCISION  1993   From thigh 1993   LUMBAR FUSION  09/16/2018   LUMBAR LAMINECTOMY/DECOMPRESSION MICRODISCECTOMY Left 12/31/2017   Procedure: Laminectomy and Foraminotomy - Lumbar Three-Four - left Extraforaminal diskectomy Lumbar Four-Five left;  Surgeon: Tia Alert, MD;  Location: [facility] OR;  Service: Neurosurgery;  Laterality: Left;   PERONEAL NERVE DECOMPRESSION Left 12/31/2017   Procedure: Left PERONEAL NERVE DECOMPRESSION;  Surgeon: Tia Alert, MD;  Location: [facility] OR;  Service: Neurosurgery;  Laterality: Left;  left   PERONEAL NERVE DECOMPRESSION     REMOVAL OF STONES  01/12/2021   Procedure: REMOVAL OF STONES;  Surgeon: Vida Rigger, MD;  Location: [facility];  Service: Endoscopy;;   Right ganglion cyst     Right ganglion cyst remove     TUBAL LIGATION       Social History:   reports that she has never smoked. She has never used smokeless tobacco. She reports that she does not drink alcohol and does not use drugs.   Family History:  Her family history includes Alzheimer's disease in her father; Cancer in her maternal aunt, maternal grandmother, and paternal grandmother; Diabetes Mellitus II in her brother; Healthy in her son and son; Heart disease in her mother; Tremor in her father.   Allergies Allergies  Allergen Reactions   Codeine Shortness Of Breath   Clavulanic Acid     Pt  tolerated Zosyn 11/6   Flagyl [Metronidazole]    Other Other (See Comments)   Prevacid [Lansoprazole]    Tetracyclines & Related Itching   Trulicity [Dulaglutide]    Azithromycin Itching and Rash   Erythromycin Itching and Rash   Morphine And Codeine Itching and Rash   Septra [Sulfamethoxazole-Trimethoprim] Itching and Rash     Home Medications  Prior to Admission medications   Medication Sig Start Date End Date Taking? Authorizing Provider  acetaminophen (TYLENOL) 500 MG tablet Take 1,000 mg by mouth 2 (two) times daily as needed for moderate pain or headache.    Yes [provider]  cholecalciferol (VITAMIN D) 1000 units tablet Take 5,000 Units by mouth daily.   Yes [provider]  colestipol (COLESTID) 1 g tablet Take 2 tablets (2 g total) by mouth in the morning and at bedtime. Patient taking differently: Take 2 g by mouth in the morning and at bedtime. *usually takes 1 qam and 2 qpm* 08/21/22  Yes Celso Amy, PA-C  CRESTOR 40 MG tablet TAKE 1 TABLET AT BEDTIME Patient taking differently: Take 40 mg by mouth at bedtime. 04/19/13  Yes Wall, Jesse Sans, MD  dapagliflozin propanediol (FARXIGA) 5 MG TABS tablet Take 5 mg by mouth daily.   Yes [provider]  desloratadine (CLARINEX) 5 MG tablet Take 1 tablet (5 mg total) by mouth daily. 11/21/22  Yes [name], MD  diclofenac Sodium (VOLTAREN) 1 % GEL Apply 4 g topically 4 (four) times daily. 11/03/21  Yes Melene Plan, DO  fluticasone (FLONASE) 50 MCG/ACT nasal spray Place 2 sprays into both nostrils daily as needed for allergies.    Yes [provider]  gabapentin (NEURONTIN) 300 MG capsule Take 300-600 mg by mouth See admin instructions. 300mg  in the morning 600mg  at night 10/26/18  Yes [provider]  losartan (COZAAR) 50 MG tablet Take 1 tablet (50 mg total) by mouth 2 (two) times daily. 05/09/23  Yes   Multiple Vitamins-Minerals (PRESERVISION AREDS 2 PO) Take 1 tablet by mouth 2 (two)  times daily.   Yes [provider]  NOVOLOG FLEXPEN RELION 100 UNIT/ML FlexPen Inject 8-10 Units into the skin 3 (three) times daily as needed. BS 12/05/20  Yes [provider]  rosuvastatin (CRESTOR) 40 MG tablet Take 1 tablet (40 mg total) by mouth at bedtime. 08/15/22  Yes [name], MD  Semaglutide,0.25 or 0.5MG /DOS, (OZEMPIC, 0.25 OR 0.5 MG/DOSE,) 2 MG/3ML SOPN Inject 0.5 mg into the skin once a week. 04/01/23  Yes   venlafaxine XR (EFFEXOR-XR) 75 MG 24 hr capsule Take 75 mg by mouth every morning.    Yes [provider]  vitamin B-12 (CYANOCOBALAMIN) 1000 MCG tablet Take 1,000 mcg by mouth 3 (three) times a week. Take on Sundays, Wednesdays and Fridays.   Yes [provider]  cephALEXin (KEFLEX) 500 MG capsule Take 1 capsule (500 mg total) by mouth 3 (three) times daily. Patient not taking: Reported on 08/06/2023 05/14/23   Lenn Sink, DPM  FREESTYLE LITE test strip USE 1 STRIP TO CHECK GLUCOSE 4 TIMES DAILY 12/04/20   [provider]  methocarbamol (ROBAXIN) 500 MG tablet Take 1 tablet (500 mg total) by mouth 2 (two) times daily. Patient not taking: Reported on 08/06/2023 11/03/21   Melene Plan, DO  methylPREDNISolone (MEDROL DOSEPAK) 4 MG TBPK tablet Day 1: 8mg  before breakfast, 4 mg after lunch, 4 mg after supper, and 8 mg at bedtime Day 2: 4 mg before breakfast, 4 mg after lunch, 4 mg  after supper, and 8 mg  at bedtime Day 3:  4 mg  before breakfast, 4 mg  after lunch, 4 mg after supper, and 4 mg  at bedtime Day 4: 4 mg  before breakfast, 4 mg  after lunch, and 4 mg at bedtime Day 5: 4 mg  before breakfast and 4 mg at bedtime Day 6: 4 mg  before breakfast Patient not taking: Reported on 08/06/2023 11/03/21   Melene Plan, DO  metoprolol tartrate (LOPRESSOR) 100 MG tablet Take one tablet by mouth 2 hours prior to your CT Patient not taking: Reported on 08/06/2023 05/08/21   Lyn Records, MD     Critical care time: 66    Rocky Morel, DO Internal  Medicine Resident, PGY-2 Pager# [phone] [facility] 08/12/2023 6:18 AM  For contact information, see Amion. If no response to pager, please call PCCM consult pager. After hours, 7PM- 7AM, please call Elink.

## 2023-08-12 NOTE — Progress Notes (Signed)
Occupational Therapy Treatment Patient Details Name: Tina Briggs MRN: [id number] DOB: 04-13-1946 Today's Date: 08/12/2023   History of present illness 77 y/o woman presented 11/6 with abdominal pain, nausea, and confusion. She was found to have septic shock and bacteremia. ERCP 11/11. PMHx: HTN, HLD, DM, IBS, anxiety, depression.   OT comments  Pt making steady progress towards OT goals though remains limited by deficits in endurance and strength. Pt received on BSC, able to mobilize to sink using RW with Min A. Pt able to manage ADLs standing briefly at sink before seated rest break needed. Encouraged gentle ROM of RUE and AROM exercises of BLE. Will continue to follow acutely and likely bring theraband HEP in next sessions. VSS on RA.       If plan is discharge home, recommend the following:  A little help with walking and/or transfers;A little help with bathing/dressing/bathroom;Assistance with cooking/housework   Equipment Recommendations  BSC/3in1    Recommendations for Other Services      Precautions / Restrictions Precautions Precautions: Fall Restrictions Weight Bearing Restrictions: No       Mobility Bed Mobility               General bed mobility comments: received on BSC    Transfers Overall transfer level: Needs assistance Equipment used: Rolling walker (2 wheels) Transfers: Sit to/from Stand Sit to Stand: Min assist, Contact guard assist           General transfer comment: CGA from [facility] though required Min A from recliner at end of session w/ RW. likely due to fatigue     Balance Overall balance assessment: Needs assistance Sitting-balance support: No upper extremity supported, Feet supported Sitting balance-Leahy Scale: Fair     Standing balance support: Bilateral upper extremity supported Standing balance-Leahy Scale: Poor                             ADL either performed or assessed with clinical judgement   ADL  Overall ADL's : Needs assistance/impaired     Grooming: Contact guard assist;Standing;Wash/dry hands;Oral care Grooming Details (indicate cue type and reason): CGA to wash hands standing at sink reaching to soap dispenser on wall. Fatigued after this and completed oral care seated at sink                 Toilet Transfer: Contact guard assist;Rolling walker (2 wheels);BSC/3in1 Toilet Transfer Details (indicate cue type and reason): CGA to stand from [facility] fairly easily and mobilize to sink for ADLs         Functional mobility during ADLs: Minimal assistance;Rolling walker (2 wheels)      Extremity/Trunk Assessment Upper Extremity Assessment Upper Extremity Assessment: Generalized weakness;Right hand dominant;RUE deficits/detail RUE Deficits / Details: increased swelling in this UE w/ discomfort and decreased ability to reach face. per pt, plan for scan to further assess if acute issue RUE Coordination: decreased fine motor   Lower Extremity Assessment Lower Extremity Assessment: Defer to PT evaluation        Vision   Vision Assessment?: No apparent visual deficits   Perception     Praxis      Cognition Arousal: Alert Behavior During Therapy: WFL for tasks assessed/performed Overall Cognitive Status: Within Functional Limits for tasks assessed  Exercises      Shoulder Instructions       General Comments Husband at bedside and supportive. Encouraged AROM BLE while up in chair    Pertinent Vitals/ Pain       Pain Assessment Pain Assessment: No/denies pain  Home Living                                          Prior Functioning/Environment              Frequency  Min 1X/week        Progress Toward Goals  OT Goals(current goals can now be found in the care plan section)  Progress towards OT goals: Progressing toward goals  Acute Rehab OT Goals Patient Stated Goal:  continue to feel better, not get tired so easily OT Goal Formulation: With patient Time For Goal Achievement: 08/23/23 Potential to Achieve Goals: Good ADL Goals Pt Will Perform Lower Body Dressing: with modified independence;sitting/lateral leans;sit to/from stand Pt Will Transfer to Toilet: with modified independence;ambulating Pt/caregiver will Perform Home Exercise Program: Increased strength;Both right and left upper extremity;Independently;With theraband;With written HEP provided Additional ADL Goal #1: Pt to increase standing tolerance > 10 min without rest break during ADLs/functional mobility  Plan      Co-evaluation                 AM-PAC OT "6 Clicks" Daily Activity     Outcome Measure   Help from another person eating meals?: A Little Help from another person taking care of personal grooming?: A Little Help from another person toileting, which includes using toliet, bedpan, or urinal?: A Little Help from another person bathing (including washing, rinsing, drying)?: A Lot Help from another person to put on and taking off regular upper body clothing?: A Little Help from another person to put on and taking off regular lower body clothing?: A Lot 6 Click Score: 16    End of Session Equipment Utilized During Treatment: Rolling walker (2 wheels)  OT Visit Diagnosis: Unsteadiness on feet (R26.81);Other abnormalities of gait and mobility (R26.89);Muscle weakness (generalized) (M62.81)   Activity Tolerance Patient tolerated treatment well;Patient limited by fatigue   Patient Left in chair;with call bell/phone within reach;with family/visitor present   Nurse Communication Mobility status        Time: 1610-9604 OT Time Calculation (min): 26 min  Charges: OT General Charges $OT Visit: 1 Visit OT Treatments $Self Care/Home Management : 8-22 mins $Therapeutic Activity: 8-22 mins  Bradd Canary, OTR/L Acute Rehab Services Office: [phone]   Lorre Munroe 08/12/2023, 1:36 PM

## 2023-08-13 ENCOUNTER — Encounter (HOSPITAL_COMMUNITY): Payer: Self-pay | Admitting: Gastroenterology

## 2023-08-13 DIAGNOSIS — A419 Sepsis, unspecified organism: Secondary | ICD-10-CM | POA: Diagnosis not present

## 2023-08-13 DIAGNOSIS — N179 Acute kidney failure, unspecified: Secondary | ICD-10-CM | POA: Diagnosis not present

## 2023-08-13 DIAGNOSIS — G9341 Metabolic encephalopathy: Secondary | ICD-10-CM | POA: Diagnosis not present

## 2023-08-13 LAB — BASIC METABOLIC PANEL
Anion gap: 12 (ref 5–15)
BUN: 66 mg/dL — ABNORMAL HIGH (ref 8–23)
CO2: 18 mmol/L — ABNORMAL LOW (ref 22–32)
Calcium: 7.1 mg/dL — ABNORMAL LOW (ref 8.9–10.3)
Chloride: 98 mmol/L (ref 98–111)
Creatinine, Ser: 3.4 mg/dL — ABNORMAL HIGH (ref 0.44–1.00)
GFR, Estimated: 13 mL/min — ABNORMAL LOW (ref >60)
Glucose, Bld: 90 mg/dL (ref 70–99)
Potassium: 3.8 mmol/L (ref 3.5–5.1)
Sodium: 128 mmol/L — ABNORMAL LOW (ref 135–145)

## 2023-08-13 LAB — CBC
HCT: 27.2 % — ABNORMAL LOW (ref 36.0–46.0)
Hemoglobin: 9.3 g/dL — ABNORMAL LOW (ref 12.0–15.0)
MCH: 29.5 pg (ref 26.0–34.0)
MCHC: 34.2 g/dL (ref 30.0–36.0)
MCV: 86.3 fL (ref 80.0–100.0)
Platelets: 178 10*3/uL (ref 150–400)
RBC: 3.15 MIL/uL — ABNORMAL LOW (ref 3.87–5.11)
RDW: 14.8 % (ref 11.5–15.5)
WBC: 15.7 10*3/uL — ABNORMAL HIGH (ref 4.0–10.5)
nRBC: 0.1 % (ref 0.0–0.2)

## 2023-08-13 LAB — GLUCOSE, CAPILLARY
Glucose-Capillary: 90 mg/dL (ref 70–99)
Glucose-Capillary: 112 mg/dL — ABNORMAL HIGH (ref 70–99)
Glucose-Capillary: 115 mg/dL — ABNORMAL HIGH (ref 70–99)
Glucose-Capillary: 150 mg/dL — ABNORMAL HIGH (ref 70–99)
Glucose-Capillary: 143 mg/dL — ABNORMAL HIGH (ref 70–99)

## 2023-08-13 NOTE — Progress Notes (Signed)
PT Cancellation Note  Patient Details Name: Tina Briggs MRN: [id number] DOB: [birth year]   Cancelled Treatment:    Reason Eval/Treat Not Completed: Patient declined, no reason specified  Fatigued from getting up to [facility] this morning. Requests PT follow-up later this afternoon.  Will attempt again as schedule allows.  Kathlyn Sacramento, PT, DPT [facility]  Rehabilitation Services Physical Therapist Office: [phone] Website: [email or web address]   Berton Mount 08/13/2023, 12:39 PM

## 2023-08-13 NOTE — Progress Notes (Signed)
Physical Therapy Treatment Patient Details Name: Tina Briggs MRN: [id number] DOB: 17-Jul-1946 Today's Date: 08/13/2023   History of Present Illness 77 y/o woman presented 11/6 with abdominal pain, nausea, and confusion. She was found to have septic shock and bacteremia. ERCP 11/11. PMHx: HTN, HLD, DM, IBS, anxiety, depression.    PT Comments  Still pretty fatigued this afternoon (initially visited this morning.) Agreeable to participate and get OOB. Able to progress with gait training in room. She is mildly unsteady, very slow, with minor buckling but able to self support via RW. Min assist for RW control, frequent cues for continuous gait, and anticipatory awareness to navigate around obstacles. Light min assist with transfers. VSS throughout session. Reviewed LE exercises which she states she has been performing regularly. Due to slow progress and independence with PLOF, would favor CIR team following along during admission for possible consult. We will progress her as tolerated but if improvements remain slow, she may benefit from sub acute rehab before returning home with husbands assist. Will update progress and recs as appropriate. Patient will continue to benefit from skilled physical therapy services to further improve independence with functional mobility.    If plan is discharge home, recommend the following: A little help with walking and/or transfers;A little help with bathing/dressing/bathroom;Assistance with cooking/housework;Assist for transportation   Can travel by private vehicle        Equipment Recommendations  None recommended by PT    Recommendations for Other Services Rehab consult     Precautions / Restrictions Precautions Precautions: Fall Precaution Comments: monitor BP Restrictions Weight Bearing Restrictions: No     Mobility  Bed Mobility Overal bed mobility: Needs Assistance Bed Mobility: Supine to Sit     Supine to sit: Min assist, HOB  elevated, Used rails     General bed mobility comments: Min assist for pt to pull through therapist hand and assisted with scoot via bed pad to EOB.    Transfers Overall transfer level: Needs assistance Equipment used: Rolling walker (2 wheels) Transfers: Sit to/from Stand Sit to Stand: Min assist           General transfer comment: Min assist for boost and light balance, increased sway upon rising but denies any dizziness. RW for support.    Ambulation/Gait Ambulation/Gait assistance: Min assist Gait Distance (Feet): 18 Feet Assistive device: Rolling walker (2 wheels) Gait Pattern/deviations: Step-through pattern, Decreased stride length, Knees buckling, Shuffle Gait velocity: slow Gait velocity interpretation: <1.31 ft/sec, indicative of household ambulator   General Gait Details: Min assist for RW control, VC for continuous steps. Intermittent freezing, delayed processing with sequencing and anticipating navigating around obstacles in room. Very mild knee instability, able to self correct with RW use. Very fatigued at end of distance but VSS throughout.   Stairs             Wheelchair Mobility     Tilt Bed    Modified Rankin (Stroke Patients Only)       Balance Overall balance assessment: Needs assistance Sitting-balance support: No upper extremity supported, Feet supported Sitting balance-Leahy Scale: Fair     Standing balance support: Bilateral upper extremity supported Standing balance-Leahy Scale: Poor                              Cognition Arousal: Lethargic Behavior During Therapy: WFL for tasks assessed/performed Overall Cognitive Status: Within Functional Limits for tasks assessed  Exercises General Exercises - Lower Extremity Ankle Circles/Pumps: AROM, Both, 10 reps, Supine Quad Sets: Strengthening, Both, 10 reps, Seated Gluteal Sets: Strengthening, Both, 10 reps,  Seated    General Comments General comments (skin integrity, edema, etc.): VSS throughout; End of session in recliner pt BP 138/62 HR 75, SpO2 95% on RA.      Pertinent Vitals/Pain Pain Assessment Pain Assessment: Faces Faces Pain Scale: Hurts little more Pain Location: Rt anterior thigh. Pain Descriptors / Indicators: Sore, Heaviness Pain Intervention(s): Monitored during session, Repositioned, Premedicated before session    Home Living                          Prior Function            PT Goals (current goals can now be found in the care plan section) Acute Rehab PT Goals Patient Stated Goal: Get well PT Goal Formulation: With patient Time For Goal Achievement: 08/23/23 Potential to Achieve Goals: Good Progress towards PT goals: Progressing toward goals    Frequency    Min 1X/week      PT Plan      Co-evaluation              AM-PAC PT "6 Clicks" Mobility   Outcome Measure  Help needed turning from your back to your side while in a flat bed without using bedrails?: A Little Help needed moving from lying on your back to sitting on the side of a flat bed without using bedrails?: A Little Help needed moving to and from a bed to a chair (including a wheelchair)?: A Little Help needed standing up from a chair using your arms (e.g., wheelchair or bedside chair)?: A Little Help needed to walk in hospital room?: A Little Help needed climbing 3-5 steps with a railing? : A Lot 6 Click Score: 17    End of Session Equipment Utilized During Treatment: Gait belt Activity Tolerance: Patient tolerated treatment well Patient left: in chair;with call bell/phone within reach;with chair alarm set;with SCD's reapplied Nurse Communication: Mobility status PT Visit Diagnosis: Unsteadiness on feet (R26.81);Muscle weakness (generalized) (M62.81);Difficulty in walking, not elsewhere classified (R26.2)     Time: 0454-0981 PT Time Calculation (min) (ACUTE ONLY): 26  min  Charges:    $Gait Training: 8-22 mins $Therapeutic Activity: 8-22 mins PT General Charges $$ ACUTE PT VISIT: 1 Visit                     Kathlyn Sacramento, PT, DPT [facility]  Rehabilitation Services Physical Therapist Office: [phone] Website: [email or web address]    Berton Mount 08/13/2023, 5:30 PM

## 2023-08-13 NOTE — Progress Notes (Signed)
[facility]                                                                               Tina Briggs, is a 77 y.o. female, DOB - Jun 29, 1946, ZOX:[id number] Admit date - 08/06/2023    Outpatient Primary MD for the patient is Tina Housekeeper, MD  LOS - 7  days    Brief summary   77 year old lady with prior history of hypertension hyperlipidemia, diabetes mellitus, IBS, anxiety and depression presents to ED for nausea diarrhea and confusion for 3 days was found to be hypotensive on admission.  CT abdomen pelvis suggestive of sigmoid diverticulitis started on IV Zosyn and IV fluids.  She was initially admitted to [facility] service for sepsis and hypovolemic shock and transferred to [facility] on 08/13/2023.   Assessment & Plan    Assessment and Plan:  Sepsis/hypovolemic shock secondary to E. coli bacteremia from ascending cholangitis Acute uncomplicated sigmoid diverticulitis with severe diarrhea C. difficile PCR is negative Currently on IV antibiotics until 11/18 She was started on diet and advance as tolerated.   Intrahepatic biliary duct dilatation/choledocholithiasis MRCP showing choledocholithiasis with possible biliary stricture Underwent ERCP with sphincterotomy with stone extraction as well as temporary plastic biliary stent placement on 11/11. Patient will likely need repeat ERCP  with spyglass for further evaluation of possible remaining CBD stones and evaluation of biliary stricture to be scheduled as outpatient. Liver enzymes improving. Bilirubin at 1.8 and alk phos stabilized at 232.   Acute kidney injury on stage IIIb CKD Patient's creatinine between 2.5-3.5 Continue to monitor.   Hyponatremia Continues to worsen from 134-132-130 and 128 today, suspect from RL fluids.  Will stop the fluids and monitor.    Severe hypoalbuminemia:  Albumin level less than 1.5 today.  From acute illness?    New onset atrial fibrillation with RVR Appears to have  resolved at this time continue to monitor on telemetry.   Type 2 diabetes mellitus A1c of 5.9 Holding patient's semaglutide and farxiga at this time.   History of anxiety and depression  continue with Effexor  Right upper extremity swelling:  From IV infiltration. Venous duplex negative for SVT or DVT.  Elevate the arm and monitor.     Estimated body mass index is 31.24 kg/m as calculated from the following:   Height as of this encounter: 5\' 3"  (1.6 m).   Weight as of this encounter: 80 kg.  Code Status: Full code DVT Prophylaxis:  heparin injection 5,000 Units Start: 08/12/23 1400 SCDs Start: 08/06/23 2011   Level of Care: Level of care: Progressive Family Communication: None at best side  Disposition Plan:     Remains inpatient appropriate: IV antibiotics for ascending cholangitis Procedures:  ERCP with sphincterotomy with stone extraction and temporary plastic biliary stent placement on 11/11 MRI of the abdomen  Consultants:   Gastroenterology.  PCCM.   Antimicrobials:   Anti-infectives (From admission, onward)    Start     Dose/Rate Route Frequency Ordered Stop   08/08/23 1400  cefTRIAXone (ROCEPHIN) 2 g in sodium chloride 0.9 % 100 mL IVPB        2 g 200 mL/hr over 30 Minutes Intravenous  Every 24 hours 08/08/23 1059 08/17/23 2359   08/06/23 2200  piperacillin-tazobactam (ZOSYN) IVPB 2.25 g  Status:  Discontinued        2.25 g 100 mL/hr over 30 Minutes Intravenous Every 8 hours 08/06/23 2015 08/08/23 1059   08/06/23 1515  clindamycin (CLEOCIN) IVPB 300 mg  Status:  Discontinued        300 mg 100 mL/hr over 30 Minutes Intravenous  Once 08/06/23 1511 08/06/23 1512   08/06/23 1515  piperacillin-tazobactam (ZOSYN) IVPB 3.375 g        3.375 g 100 mL/hr over 30 Minutes Intravenous  Once 08/06/23 1512 08/06/23 1613        Medications  Scheduled Meds:  Chlorhexidine Gluconate Cloth  6 each Topical Daily   gabapentin  100 mg Oral BID   heparin injection  (subcutaneous)  5,000 Units Subcutaneous Q8H   insulin aspart  0-5 Units Subcutaneous QHS   insulin aspart  0-9 Units Subcutaneous TID WC   mouth rinse  15 mL Mouth Rinse 4 times per day   polyethylene glycol  17 g Oral Daily   rosuvastatin  40 mg Oral QHS   venlafaxine XR  75 mg Oral Q breakfast   Continuous Infusions:  cefTRIAXone (ROCEPHIN)  IV Stopped (08/12/23 1512)   lactated ringers 100 mL/hr at 08/13/23 0900   PRN Meds:.acetaminophen, ondansetron, mouth rinse, mouth rinse    Subjective:   Tina Briggs was seen and examined today.  No new complaints.   Objective:   Vitals:   08/13/23 0600 08/13/23 0700 08/13/23 0749 08/13/23 0800  BP: (!) 118/57 (!) 114/54  (!) 130/56  Pulse: 63 62 64 65  Resp: 12 11 15 15   Temp:   97.7 F (36.5 C)   TempSrc:   Oral   SpO2: 96% 95% 93% 93%  Weight:      Height:        Intake/Output Summary (Last 24 hours) at 08/13/2023 1009 Last data filed at 08/13/2023 0900 Gross per 24 hour  Intake 2345.2 ml  Output 700 ml  Net 1645.2 ml   Filed Weights   08/09/23 0351 08/10/23 0500 08/11/23 0500  Weight: 79.3 kg 78.6 kg 80 kg     Exam General exam: Appears calm and comfortable  Respiratory system: Clear to auscultation. Respiratory effort normal. Cardiovascular system: S1 & S2 heard, RRR. No JVD, Gastrointestinal system: Abdomen is nondistended, soft and nontender.  Central nervous system: Alert and oriented. Extremities: Symmetric 5 x 5 power. Skin: No rashes,     Data Reviewed:  I have personally reviewed following labs and imaging studies   CBC Lab Results  Component Value Date   WBC 15.7 (H) 08/13/2023   RBC 3.15 (L) 08/13/2023   HGB 9.3 (L) 08/13/2023   HCT 27.2 (L) 08/13/2023   MCV 86.3 08/13/2023   MCH 29.5 08/13/2023   PLT 178 08/13/2023   MCHC 34.2 08/13/2023   RDW 14.8 08/13/2023   LYMPHSABS 0.5 (L) 08/12/2023   MONOABS 0.6 08/12/2023   EOSABS 0.0 08/12/2023   BASOSABS 0.0 08/12/2023     Last  metabolic panel Lab Results  Component Value Date   NA 128 (L) 08/13/2023   K 3.8 08/13/2023   CL 98 08/13/2023   CO2 18 (L) 08/13/2023   BUN 66 (H) 08/13/2023   CREATININE 3.40 (H) 08/13/2023   GLUCOSE 90 08/13/2023   GFRNONAA 13 (L) 08/13/2023   GFRAA >60 10/19/2018   CALCIUM 7.1 (L) 08/13/2023   PHOS 4.0 08/08/2023  PROT 4.6 (L) 08/12/2023   ALBUMIN <1.5 (L) 08/12/2023   LABGLOB 3.1 08/26/2017   BILITOT 1.8 (H) 08/12/2023   ALKPHOS 232 (H) 08/12/2023   AST 27 08/12/2023   ALT 35 08/12/2023   ANIONGAP 12 08/13/2023    CBG (last 3)  Recent Labs    08/12/23 1558 08/12/23 2124 08/13/23 0752  GLUCAP 139* 95 90      Coagulation Profile: Recent Labs  Lab 08/06/23 1310 08/08/23 1113 08/09/23 0630  INR 1.4* 1.3* 1.2     Radiology Studies: VAS [place] UPPER EXTREMITY VENOUS DUPLEX  Result Date: 08/12/2023 UPPER VENOUS STUDY  Patient Name:  AQUINNAH VUNCANNON  Date of Exam:   08/12/2023 Medical Rec #: [id number]            Accession #:    [id number] Date of Birth: 02-Mar-1946           Patient Gender: F Patient Age:   65 years Exam Location:  [facility] Procedure:      VAS [place] UPPER EXTREMITY VENOUS DUPLEX Referring Phys: Melody Comas --------------------------------------------------------------------------------  Indications: Swelling Comparison Study: No priors. Performing Technologist: Marilynne Halsted RDMS, RVT  Examination Guidelines: A complete evaluation includes B-mode imaging, spectral Doppler, color Doppler, and power Doppler as needed of all accessible portions of each vessel. Bilateral testing is considered an integral part of a complete examination. Limited examinations for reoccurring indications may be performed as noted.  Right Findings: +----------+------------+---------+-----------+----------+-------+ RIGHT     CompressiblePhasicitySpontaneousPropertiesSummary +----------+------------+---------+-----------+----------+-------+ IJV           Full        Yes       Yes                      +----------+------------+---------+-----------+----------+-------+ Subclavian    Full       Yes       Yes                      +----------+------------+---------+-----------+----------+-------+ Axillary      Full       Yes       Yes                      +----------+------------+---------+-----------+----------+-------+ Brachial      Full                                          +----------+------------+---------+-----------+----------+-------+ Radial        Full                                          +----------+------------+---------+-----------+----------+-------+ Ulnar         Full                                          +----------+------------+---------+-----------+----------+-------+ Cephalic      Full                                          +----------+------------+---------+-----------+----------+-------+ Basilic       Full                                          +----------+------------+---------+-----------+----------+-------+  Left Findings: +----------+------------+---------+-----------+----------+-------+ LEFT      CompressiblePhasicitySpontaneousPropertiesSummary +----------+------------+---------+-----------+----------+-------+ Subclavian               Yes       Yes                      +----------+------------+---------+-----------+----------+-------+  Summary:  Right: No evidence of superficial vein thrombosis in the upper extremity.  Left: No evidence of thrombosis in the subclavian.  *See table(s) above for measurements and observations.  Diagnosing physician: Lemar Livings MD Electronically signed by Lemar Livings MD on 08/12/2023 at 7:01:04 PM.    Final        Kathlen Mody M.D. [facility] 08/13/2023, 10:09 AM  Available via Epic secure chat 7am-7pm After 7 pm, please refer to night coverage provider listed on amion.

## 2023-08-13 NOTE — Plan of Care (Signed)

## 2023-08-14 DIAGNOSIS — A419 Sepsis, unspecified organism: Secondary | ICD-10-CM | POA: Diagnosis not present

## 2023-08-14 DIAGNOSIS — R6521 Severe sepsis with septic shock: Secondary | ICD-10-CM | POA: Diagnosis not present

## 2023-08-14 LAB — CBC
HCT: 29.5 % — ABNORMAL LOW (ref 36.0–46.0)
Hemoglobin: 9.7 g/dL — ABNORMAL LOW (ref 12.0–15.0)
MCH: 29.3 pg (ref 26.0–34.0)
MCHC: 32.9 g/dL (ref 30.0–36.0)
MCV: 89.1 fL (ref 80.0–100.0)
Platelets: 212 10*3/uL (ref 150–400)
RBC: 3.31 MIL/uL — ABNORMAL LOW (ref 3.87–5.11)
RDW: 15.3 % (ref 11.5–15.5)
WBC: 12 10*3/uL — ABNORMAL HIGH (ref 4.0–10.5)
nRBC: 0 % (ref 0.0–0.2)

## 2023-08-14 LAB — BASIC METABOLIC PANEL
Anion gap: 10 (ref 5–15)
BUN: 65 mg/dL — ABNORMAL HIGH (ref 8–23)
CO2: 20 mmol/L — ABNORMAL LOW (ref 22–32)
Calcium: 7.5 mg/dL — ABNORMAL LOW (ref 8.9–10.3)
Chloride: 103 mmol/L (ref 98–111)
Creatinine, Ser: 3.75 mg/dL — ABNORMAL HIGH (ref 0.44–1.00)
GFR, Estimated: 12 mL/min — ABNORMAL LOW (ref >60)
Glucose, Bld: 109 mg/dL — ABNORMAL HIGH (ref 70–99)
Potassium: 4.3 mmol/L (ref 3.5–5.1)
Sodium: 133 mmol/L — ABNORMAL LOW (ref 135–145)

## 2023-08-14 LAB — CULTURE, BLOOD (ROUTINE X 2)
Culture: NO GROWTH
Culture: NO GROWTH

## 2023-08-14 LAB — GLUCOSE, CAPILLARY
Glucose-Capillary: 92 mg/dL (ref 70–99)
Glucose-Capillary: 121 mg/dL — ABNORMAL HIGH (ref 70–99)
Glucose-Capillary: 74 mg/dL (ref 70–99)
Glucose-Capillary: 126 mg/dL — ABNORMAL HIGH (ref 70–99)

## 2023-08-14 NOTE — Progress Notes (Signed)
Occupational Therapy Treatment Patient Details Name: Tina Briggs MRN: [id number] DOB: Sep 07, 1946 Today's Date: 08/14/2023   History of present illness 77 y/o woman presented 11/6 with abdominal pain, nausea, and confusion. She was found to have septic shock and bacteremia. ERCP 11/11. PMHx: HTN, HLD, DM, IBS, anxiety, depression.   OT comments  Pt limited by reported continuous fatigue but agreeable for UE HEP education to maximize upper body strength outside of therapy sessions. Pt's RUE remains swollen and sore so exercises downgraded and modified on this UE. Pt's spouse at bedside and hands on to assist with modification of UE HEP to allow successful completion. Discussed home vs inpatient rehab w/ pt/spouse wanting to further think about it. Spouse endorses ability to provide assist however he can; discussed potential modifications for ADL routine and energy conservation to maximize independence/safety in home environment.       If plan is discharge home, recommend the following:  A little help with walking and/or transfers;A little help with bathing/dressing/bathroom;Assistance with cooking/housework   Equipment Recommendations  BSC/3in1    Recommendations for Other Services      Precautions / Restrictions Precautions Precautions: Fall Restrictions Weight Bearing Restrictions: No       Mobility Bed Mobility                    Transfers                         Balance                                           ADL either performed or assessed with clinical judgement   ADL Overall ADL's : Needs assistance/impaired                                       General ADL Comments: Planned education of UE HEP in this session; limited to bed level due to pt reported continuous fatigue (pt unsure why and frustrated by difficulty in staying awake)    Extremity/Trunk Assessment Upper Extremity Assessment Upper Extremity  Assessment: Right hand dominant;RUE deficits/detail RUE Deficits / Details: increased swelling in this UE w/ discomfort and decreased ability to reach top of head or face. from IV infiltration per chart. encouraged elevation and AROM until able to better tolerate resistance for exercise RUE Coordination: decreased fine motor   Lower Extremity Assessment Lower Extremity Assessment: Defer to PT evaluation        Vision   Vision Assessment?: No apparent visual deficits   Perception     Praxis      Cognition Arousal: Alert Behavior During Therapy: WFL for tasks assessed/performed Overall Cognitive Status: Within Functional Limits for tasks assessed                                 General Comments: sleeping on entry but easily awakened and maintains alertness during activity        Exercises Exercises: General Upper Extremity General Exercises - Upper Extremity Shoulder Flexion: Both, AROM, Strengthening, 5 reps, Theraband Theraband Level (Shoulder Flexion): Level 1 (Yellow) Shoulder Horizontal ABduction: Strengthening, AROM, Both, 5 reps, Theraband Theraband Level (Shoulder Horizontal Abduction): Level 1 (Yellow) Elbow Flexion: Strengthening,  Both, 5 reps, Theraband Theraband Level (Elbow Flexion): Level 1 (Yellow) Elbow Extension: Strengthening, Both, 5 reps, Theraband Theraband Level (Elbow Extension): Level 1 (Yellow)    Shoulder Instructions       General Comments Husband at bedside    Pertinent Vitals/ Pain       Pain Assessment Pain Assessment: Faces Faces Pain Scale: Hurts a little bit Pain Location: R arm near elbow Pain Descriptors / Indicators: Sore Pain Intervention(s): Monitored during session  Home Living                                          Prior Functioning/Environment              Frequency  Min 1X/week        Progress Toward Goals  OT Goals(current goals can now be found in the care plan section)   Progress towards OT goals: OT to reassess next treatment  Acute Rehab OT Goals Patient Stated Goal: not be so tired OT Goal Formulation: With patient Time For Goal Achievement: 08/23/23 Potential to Achieve Goals: Good ADL Goals Pt Will Perform Lower Body Dressing: with modified independence;sitting/lateral leans;sit to/from stand Pt Will Transfer to Toilet: with modified independence;ambulating Pt/caregiver will Perform Home Exercise Program: Increased strength;Both right and left upper extremity;Independently;With theraband;With written HEP provided Additional ADL Goal #1: Pt to increase standing tolerance > 10 min without rest break during ADLs/functional mobility  Plan      Co-evaluation                 AM-PAC OT "6 Clicks" Daily Activity     Outcome Measure   Help from another person eating meals?: A Little Help from another person taking care of personal grooming?: A Little Help from another person toileting, which includes using toliet, bedpan, or urinal?: A Little Help from another person bathing (including washing, rinsing, drying)?: A Lot Help from another person to put on and taking off regular upper body clothing?: A Little Help from another person to put on and taking off regular lower body clothing?: A Lot 6 Click Score: 16    End of Session    OT Visit Diagnosis: Unsteadiness on feet (R26.81);Other abnormalities of gait and mobility (R26.89);Muscle weakness (generalized) (M62.81)   Activity Tolerance Patient limited by fatigue   Patient Left in bed;with call bell/phone within reach;with family/visitor present   Nurse Communication          Time: 1330-1350 OT Time Calculation (min): 20 min  Charges: OT General Charges $OT Visit: 1 Visit OT Treatments $Therapeutic Exercise: 8-22 mins  Bradd Canary, OTR/L Acute Rehab Services Office: [phone]   Lorre Munroe 08/14/2023, 2:05 PM

## 2023-08-14 NOTE — Care Management Important Message (Signed)
Important Message  Patient Details  Name: Tina Briggs MRN: [id number] Date of Birth: 1946-03-18   Important Message Given:  Yes - Medicare IM     Dorena Bodo 08/14/2023, 3:30 PM

## 2023-08-14 NOTE — Plan of Care (Signed)

## 2023-08-14 NOTE — Plan of Care (Signed)
  Problem: Clinical Measurements: Goal: Will remain free from infection Outcome: Progressing Goal: Respiratory complications will improve Outcome: Progressing   Problem: Activity: Goal: Risk for activity intolerance will decrease Outcome: Progressing   Problem: Nutrition: Goal: Adequate nutrition will be maintained Outcome: Progressing   

## 2023-08-14 NOTE — Progress Notes (Signed)
[facility]                                                                               Tina Briggs, is a 77 y.o. female, DOB - 08-07-1946, WUJ:[id number] Admit date - 08/06/2023    Outpatient Primary MD for the patient is [name], MD  LOS - 8  days    Brief summary   77 year old lady with prior history of hypertension hyperlipidemia, diabetes mellitus, IBS, anxiety and depression presents to ED for nausea diarrhea and confusion for 3 days was found to be hypotensive on admission.  CT abdomen pelvis suggestive of sigmoid diverticulitis started on IV Zosyn and IV fluids.  She was initially admitted to [facility] service for sepsis and hypovolemic shock and transferred to [facility] on 08/13/2023.   Assessment & Plan    Assessment and Plan:  Sepsis/hypovolemic shock secondary to E. coli bacteremia from ascending cholangitis Acute uncomplicated sigmoid diverticulitis with severe diarrhea C. difficile PCR is negative Currently on IV antibiotics until 11/18 She was started on diet and advance as tolerated.   Intrahepatic biliary duct dilatation/choledocholithiasis/cholangitis MRCP showing choledocholithiasis with possible biliary stricture Underwent ERCP with sphincterotomy with stone extraction as well as temporary plastic biliary stent placement on 11/11. Patient will likely need repeat ERCP  with spyglass for further evaluation of possible remaining CBD stones and evaluation of biliary stricture to be scheduled as outpatient. Liver enzymes improving. Bilirubin at 1.8 and alk phos stabilized at 232.  Recheck LFTs in a.m. -Status post laparoscopic cholecystectomy in the past   Acute kidney injury on stage IIIb CKD Patient's creatinine between 2.5-3.5 Continue to monitor.   Hyponatremia -Improving   Severe hypoalbuminemia:  PCM Significantly low albumin, will consult nutritionist    New onset atrial fibrillation with RVR Appears to be single episode,  no recurrence in the setting of septic shock with no recurrence, will hold anticoagulation at this point as risks outweighed benefits   Type 2 diabetes mellitus A1c of 5.9 Holding patient's semaglutide and farxiga at this time.   History of anxiety and depression  continue with Effexor  Right upper extremity swelling:  From IV infiltration. Venous duplex negative for SVT or DVT.  Elevate the arm and monitor.     Estimated body mass index is 33.62 kg/m as calculated from the following:   Height as of this encounter: 5\' 3"  (1.6 m).   Weight as of this encounter: 86.1 kg.  Code Status: Full code DVT Prophylaxis:  heparin injection 5,000 Units Start: 08/12/23 1400 SCDs Start: 08/06/23 2011   Level of Care: Level of care: Progressive Family Communication: None at best side  Disposition Plan:     Remains inpatient appropriate: IV antibiotics for ascending cholangitis Procedures:  ERCP with sphincterotomy with stone extraction and temporary plastic biliary stent placement on 11/11 MRI of the abdomen  Consultants:   Gastroenterology.  PCCM.   Antimicrobials:   Anti-infectives (From admission, onward)    Start     Dose/Rate Route Frequency Ordered Stop   08/08/23 1400  cefTRIAXone (ROCEPHIN) 2 g in sodium chloride 0.9 % 100 mL IVPB        2 g 200 mL/hr  over 30 Minutes Intravenous Every 24 hours 08/08/23 1059 08/17/23 2359   08/06/23 2200  piperacillin-tazobactam (ZOSYN) IVPB 2.25 g  Status:  Discontinued        2.25 g 100 mL/hr over 30 Minutes Intravenous Every 8 hours 08/06/23 2015 08/08/23 1059   08/06/23 1515  clindamycin (CLEOCIN) IVPB 300 mg  Status:  Discontinued        300 mg 100 mL/hr over 30 Minutes Intravenous  Once 08/06/23 1511 08/06/23 1512   08/06/23 1515  piperacillin-tazobactam (ZOSYN) IVPB 3.375 g        3.375 g 100 mL/hr over 30 Minutes Intravenous  Once 08/06/23 1512 08/06/23 1613        Medications  Scheduled Meds:  Chlorhexidine Gluconate  Cloth  6 each Topical Daily   gabapentin  100 mg Oral BID   heparin injection (subcutaneous)  5,000 Units Subcutaneous Q8H   insulin aspart  0-5 Units Subcutaneous QHS   insulin aspart  0-9 Units Subcutaneous TID WC   mouth rinse  15 mL Mouth Rinse 4 times per day   polyethylene glycol  17 g Oral Daily   rosuvastatin  40 mg Oral QHS   venlafaxine XR  75 mg Oral Q breakfast   Continuous Infusions:  cefTRIAXone (ROCEPHIN)  IV Stopped (08/13/23 1454)   PRN Meds:.acetaminophen, ondansetron, mouth rinse, mouth rinse    Subjective:   Rori Meinders was seen and examined today.  No new complaints.   Objective:   Vitals:   08/14/23 0000 08/14/23 0535 08/14/23 0829 08/14/23 1205  BP: (!) 126/58 (!) 135/57 125/60 (!) 122/57  Pulse:   79 78  Resp:   18 14  Temp: 98 F (36.7 C) 98.2 F (36.8 C) (!) 97.3 F (36.3 C) 97.9 F (36.6 C)  TempSrc: Oral Oral Oral Oral  SpO2:      Weight:  86.1 kg    Height:        Intake/Output Summary (Last 24 hours) at 08/14/2023 1231 Last data filed at 08/14/2023 0600 Gross per 24 hour  Intake 730 ml  Output 850 ml  Net -120 ml   Filed Weights   08/10/23 0500 08/11/23 0500 08/14/23 0535  Weight: 78.6 kg 80 kg 86.1 kg     Exam  Awake Alert, Oriented X 3, frail, deconditioned Symmetrical Chest wall movement, Good air movement bilaterally, CTAB RRR,No Gallops,Rubs or new Murmurs, No Parasternal Heave +ve B.Sounds, Abd Soft, No tenderness, No rebound - guarding or rigidity. No Cyanosis, Clubbing or edema, No new Rash or bruise       Data Reviewed:  I have personally reviewed following labs and imaging studies   CBC Lab Results  Component Value Date   WBC 15.7 (H) 08/13/2023   RBC 3.15 (L) 08/13/2023   HGB 9.3 (L) 08/13/2023   HCT 27.2 (L) 08/13/2023   MCV 86.3 08/13/2023   MCH 29.5 08/13/2023   PLT 178 08/13/2023   MCHC 34.2 08/13/2023   RDW 14.8 08/13/2023   LYMPHSABS 0.5 (L) 08/12/2023   MONOABS 0.6 08/12/2023   EOSABS  0.0 08/12/2023   BASOSABS 0.0 08/12/2023     Last metabolic panel Lab Results  Component Value Date   NA 133 (L) 08/14/2023   K 4.3 08/14/2023   CL 103 08/14/2023   CO2 20 (L) 08/14/2023   BUN 65 (H) 08/14/2023   CREATININE 3.75 (H) 08/14/2023   GLUCOSE 109 (H) 08/14/2023   GFRNONAA 12 (L) 08/14/2023   GFRAA >60 10/19/2018   CALCIUM  7.5 (L) 08/14/2023   PHOS 4.0 08/08/2023   PROT 4.6 (L) 08/12/2023   ALBUMIN <1.5 (L) 08/12/2023   LABGLOB 3.1 08/26/2017   BILITOT 1.8 (H) 08/12/2023   ALKPHOS 232 (H) 08/12/2023   AST 27 08/12/2023   ALT 35 08/12/2023   ANIONGAP 10 08/14/2023    CBG (last 3)  Recent Labs    08/13/23 2114 08/14/23 0832 08/14/23 1206  GLUCAP 143* 92 121*      Coagulation Profile: Recent Labs  Lab 08/08/23 1113 08/09/23 0630  INR 1.3* 1.2     Radiology Studies: VAS [place] UPPER EXTREMITY VENOUS DUPLEX  Result Date: 08/12/2023 UPPER VENOUS STUDY  Patient Name:  SEANA STELMA  Date of Exam:   08/12/2023 Medical Rec #: [id number]            Accession #:    [id number] Date of Birth: [birth year]           Patient Gender: F Patient Age:   74 years Exam Location:  [facility] Procedure:      VAS [place] UPPER EXTREMITY VENOUS DUPLEX Referring Phys: Melody Comas --------------------------------------------------------------------------------  Indications: Swelling Comparison Study: No priors. Performing Technologist: Marilynne Halsted RDMS, RVT  Examination Guidelines: A complete evaluation includes B-mode imaging, spectral Doppler, color Doppler, and power Doppler as needed of all accessible portions of each vessel. Bilateral testing is considered an integral part of a complete examination. Limited examinations for reoccurring indications may be performed as noted.  Right Findings: +----------+------------+---------+-----------+----------+-------+ RIGHT     CompressiblePhasicitySpontaneousPropertiesSummary  +----------+------------+---------+-----------+----------+-------+ IJV           Full       Yes       Yes                      +----------+------------+---------+-----------+----------+-------+ Subclavian    Full       Yes       Yes                      +----------+------------+---------+-----------+----------+-------+ Axillary      Full       Yes       Yes                      +----------+------------+---------+-----------+----------+-------+ Brachial      Full                                          +----------+------------+---------+-----------+----------+-------+ Radial        Full                                          +----------+------------+---------+-----------+----------+-------+ Ulnar         Full                                          +----------+------------+---------+-----------+----------+-------+ Cephalic      Full                                          +----------+------------+---------+-----------+----------+-------+ Basilic  Full                                          +----------+------------+---------+-----------+----------+-------+  Left Findings: +----------+------------+---------+-----------+----------+-------+ LEFT      CompressiblePhasicitySpontaneousPropertiesSummary +----------+------------+---------+-----------+----------+-------+ Subclavian               Yes       Yes                      +----------+------------+---------+-----------+----------+-------+  Summary:  Right: No evidence of superficial vein thrombosis in the upper extremity.  Left: No evidence of thrombosis in the subclavian.  *See table(s) above for measurements and observations.  Diagnosing physician: Lemar Livings MD Electronically signed by Lemar Livings MD on 08/12/2023 at 7:01:04 PM.    Final        Huey Bienenstock M.D. [facility] 08/14/2023, 12:31 PM  Available via Epic secure chat 7am-7pm After 7 pm, please refer to  night coverage provider listed on amion.

## 2023-08-14 NOTE — Progress Notes (Signed)
Inpatient Rehabilitation Admissions Coordinator   I await improved participation with therapy before consulting for possible CIR admit. I will follow up tomorrow. Not currently at a level for CIR .  Ottie Glazier, RN, MSN Rehab Admissions Coordinator [phone] 08/14/2023 1:51 PM

## 2023-08-14 NOTE — TOC Progression Note (Addendum)
Transition of Care [name]) - Progression Note    Patient Details  Name: Tina Briggs MRN: [id number] Date of Birth: 13-Oct-1945  Transition of Care [facility]) CM/SW Contact  Mearl Latin, LCSW Phone Number: 08/14/2023, 4:20 PM  Clinical Narrative:    CSW received consult for possible home health services at time of discharge. CSW spoke with patient, spouse, and friends with verbal permission at bedside. Patient reported that she prefers CIR but if not, she would like home health services. She does not want to go to SNF. Spouse confirmed that he can care for her well at home. RNCM has set patient up with [facility]. CSW discussed equipment needs and patient requested either a rolling walker or wheelchair depending on what is recommended at time of discharge and a 3in1 BSC. CSW will request from [facility]. CSW confirmed PCP and address. No further questions reported at this time.     Expected Discharge Plan: Home w Home Health Services Barriers to Discharge: Continued Medical Work up  Expected Discharge Plan and Services In-house Referral: Clinical Social Work Discharge Planning Services: (P) CM Consult Post Acute Care Choice: Home Health, Durable Medical Equipment Living arrangements for the past 2 months: Single Family Home                           HH Arranged: PT, OT HH Agency: [facility] Date [facility] Agency Contacted: 08/11/23 Time HH Agency Contacted: 1617 Representative spoke with at [facility] Agency: Kandee Keen   Social Determinants of Health (SDOH) Interventions SDOH Screenings   Food Insecurity: No Food Insecurity (08/12/2023)  Housing: Low Risk  (08/12/2023)  Transportation Needs: No Transportation Needs (08/12/2023)  Utilities: Not At Risk (08/12/2023)  Depression (PHQ2-9): Low Risk  (01/25/2021)  Tobacco Use: Low Risk  (08/11/2023)    Readmission Risk Interventions     No data to display

## 2023-08-14 NOTE — Progress Notes (Signed)
PT Cancellation Note  Patient Details Name: Tina Briggs MRN: [id number] DOB: 13-Jan-1946   Cancelled Treatment:     Attempted x2 to see pt for PT treatment. Pt refusing to participate in morning due to just returning to bed and wanting to eat, then refusing to participate in afternoon due to fatigue. Will attempt again as schedule allows and as pt is willing to participate.    Marlana Salvage Zaunegger Blima Rich PT, DPT 08/14/2023, 1:22 PM

## 2023-08-14 NOTE — Progress Notes (Signed)
Inpatient Rehab Admissions Coordinator Note:   Per updated PT recommendations patient was screened for CIR candidacy by Stephania Fragmin, PT. At this time, pt appears to be a potential candidate for CIR. I will place an order for rehab consult for full assessment, per our protocol.  Please contact me any with questions.Estill Dooms, PT, DPT [phone] 08/14/23 8:31 AM

## 2023-08-15 DIAGNOSIS — R5381 Other malaise: Secondary | ICD-10-CM | POA: Diagnosis not present

## 2023-08-15 DIAGNOSIS — A419 Sepsis, unspecified organism: Secondary | ICD-10-CM | POA: Diagnosis not present

## 2023-08-15 DIAGNOSIS — K8309 Other cholangitis: Secondary | ICD-10-CM | POA: Diagnosis not present

## 2023-08-15 DIAGNOSIS — R6521 Severe sepsis with septic shock: Secondary | ICD-10-CM | POA: Diagnosis not present

## 2023-08-15 LAB — CULTURE, BLOOD (ROUTINE X 2)
Special Requests: ADEQUATE
Special Requests: ADEQUATE

## 2023-08-15 LAB — PHOSPHORUS: Phosphorus: 4.3 mg/dL (ref 2.5–4.6)

## 2023-08-15 LAB — HEPATIC FUNCTION PANEL
ALT: 26 U/L (ref 0–44)
AST: 21 U/L (ref 15–41)
Albumin: <1.5 g/dL — ABNORMAL LOW (ref 3.5–5.0)
Alkaline Phosphatase: 231 U/L — ABNORMAL HIGH (ref 38–126)
Bilirubin, Direct: 0.5 mg/dL — ABNORMAL HIGH (ref 0.0–0.2)
Indirect Bilirubin: 0.3 mg/dL (ref 0.3–0.9)
Total Bilirubin: 0.8 mg/dL (ref <1.2)
Total Protein: 5 g/dL — ABNORMAL LOW (ref 6.5–8.1)

## 2023-08-15 LAB — CBC
HCT: 24.9 % — ABNORMAL LOW (ref 36.0–46.0)
Hemoglobin: 8.2 g/dL — ABNORMAL LOW (ref 12.0–15.0)
MCH: 29.2 pg (ref 26.0–34.0)
MCHC: 32.9 g/dL (ref 30.0–36.0)
MCV: 88.6 fL (ref 80.0–100.0)
Platelets: 222 10*3/uL (ref 150–400)
RBC: 2.81 MIL/uL — ABNORMAL LOW (ref 3.87–5.11)
RDW: 15.2 % (ref 11.5–15.5)
WBC: 9.1 10*3/uL (ref 4.0–10.5)
nRBC: 0 % (ref 0.0–0.2)

## 2023-08-15 LAB — BASIC METABOLIC PANEL
Anion gap: 9 (ref 5–15)
BUN: 53 mg/dL — ABNORMAL HIGH (ref 8–23)
CO2: 21 mmol/L — ABNORMAL LOW (ref 22–32)
Calcium: 7.7 mg/dL — ABNORMAL LOW (ref 8.9–10.3)
Chloride: 109 mmol/L (ref 98–111)
Creatinine, Ser: 2.99 mg/dL — ABNORMAL HIGH (ref 0.44–1.00)
GFR, Estimated: 16 mL/min — ABNORMAL LOW (ref >60)
Glucose, Bld: 115 mg/dL — ABNORMAL HIGH (ref 70–99)
Potassium: 3.7 mmol/L (ref 3.5–5.1)
Sodium: 139 mmol/L (ref 135–145)

## 2023-08-15 LAB — GLUCOSE, CAPILLARY
Glucose-Capillary: 106 mg/dL — ABNORMAL HIGH (ref 70–99)
Glucose-Capillary: 150 mg/dL — ABNORMAL HIGH (ref 70–99)
Glucose-Capillary: 139 mg/dL — ABNORMAL HIGH (ref 70–99)
Glucose-Capillary: 85 mg/dL (ref 70–99)

## 2023-08-15 MED ORDER — ENSURE ENLIVE PO LIQD
237.0000 mL | Freq: Two times a day (BID) | ORAL | Status: DC
  Administered 2023-08-18 – 2023-08-19 (×2): 237 mL via ORAL

## 2023-08-15 MED ORDER — COLESTIPOL HCL 1 G PO TABS
2.0000 g | ORAL_TABLET | Freq: Two times a day (BID) | ORAL | Status: DC
  Administered 2023-08-15 – 2023-08-19 (×8): 2 g via ORAL
  Filled 2023-08-15 (×10): qty 2

## 2023-08-15 NOTE — Progress Notes (Signed)
[facility]                                                                               Tina Briggs, is a 77 y.o. female, DOB - [birth year], [id number] Admit date - 08/06/2023    Outpatient Primary MD for the patient is [name], MD  LOS - 9  days    Brief summary   77 year old lady with prior history of hypertension hyperlipidemia, diabetes mellitus, IBS, anxiety and depression presents to ED for nausea diarrhea and confusion for 3 days was found to be hypotensive on admission.  CT abdomen pelvis suggestive of sigmoid diverticulitis started on IV Zosyn and IV fluids.  She was initially admitted to [facility] service for sepsis and hypovolemic shock and transferred to [facility] on 08/13/2023.   Assessment & Plan    Assessment and Plan:  Sepsis/hypovolemic shock secondary to E. coli bacteremia from ascending cholangitis Acute uncomplicated sigmoid diverticulitis with severe diarrhea C. difficile PCR is negative Currently on IV antibiotics until 11/18, will transition to p.o. after that She was started on diet and advance as tolerated. Patient reports significant diarrhea at home at baseline, so we will resume her home cholestyramine   Intrahepatic biliary duct dilatation/choledocholithiasis/cholangitis MRCP showing choledocholithiasis with possible biliary stricture Underwent ERCP with sphincterotomy with stone extraction as well as temporary plastic biliary stent placement on 11/11. Patient will likely need repeat ERCP  with spyglass for further evaluation of possible remaining CBD stones and evaluation of biliary stricture to be scheduled as outpatient. Liver enzymes improving. Bilirubin at 1.8 and alk phos stabilized at 232.  Stable on repeat today. -Status post laparoscopic cholecystectomy in the past   Acute kidney injury on stage IIIb CKD Patient's creatinine between 2.5-3.5 Continue to improve.  Hyponatremia -Improving  Severe hypoalbuminemia:   PCM Significantly low albumin, will consult nutritionist Started on supplements.   New onset atrial fibrillation with RVR Appears to be single episode, no recurrence in the setting of septic shock with no recurrence, will hold anticoagulation at this point as risks outweighed benefits   Type 2 diabetes mellitus A1c of 5.9 Holding patient's semaglutide and farxiga at this time.   History of anxiety and depression  continue with Effexor  Right upper extremity swelling:  From IV infiltration. Venous duplex negative for SVT or DVT.  Elevate the arm and monitor.  Apply warm compress  Hypertension -Blood pressure started to increase, if continues to increase then we will resume home dose metoprolol.  Estimated body mass index is 35.03 kg/m as calculated from the following:   Height as of this encounter: 5\' 3"  (1.6 m).   Weight as of this encounter: 89.7 kg.  Code Status: Full code DVT Prophylaxis:  heparin injection 5,000 Units Start: 08/12/23 1400 SCDs Start: 08/06/23 2011   Level of Care: Level of care: Progressive Family Communication: None at best side  Disposition Plan:     Remains inpatient appropriate: IV antibiotics for ascending cholangitis Procedures:  ERCP with sphincterotomy with stone extraction and temporary plastic biliary stent placement on 11/11 MRI of the abdomen  Consultants:   Gastroenterology.  PCCM.   Antimicrobials:   Anti-infectives (From admission, onward)  Start     Dose/Rate Route Frequency Ordered Stop   08/08/23 1400  cefTRIAXone (ROCEPHIN) 2 g in sodium chloride 0.9 % 100 mL IVPB        2 g 200 mL/hr over 30 Minutes Intravenous Every 24 hours 08/08/23 1059 08/17/23 2359   08/06/23 2200  piperacillin-tazobactam (ZOSYN) IVPB 2.25 g  Status:  Discontinued        2.25 g 100 mL/hr over 30 Minutes Intravenous Every 8 hours 08/06/23 2015 08/08/23 1059   08/06/23 1515  clindamycin (CLEOCIN) IVPB 300 mg  Status:  Discontinued        300  mg 100 mL/hr over 30 Minutes Intravenous  Once 08/06/23 1511 08/06/23 1512   08/06/23 1515  piperacillin-tazobactam (ZOSYN) IVPB 3.375 g        3.375 g 100 mL/hr over 30 Minutes Intravenous  Once 08/06/23 1512 08/06/23 1613        Medications  Scheduled Meds:  Chlorhexidine Gluconate Cloth  6 each Topical Daily   colestipol  2 g Oral BID   gabapentin  100 mg Oral BID   heparin injection (subcutaneous)  5,000 Units Subcutaneous Q8H   insulin aspart  0-5 Units Subcutaneous QHS   insulin aspart  0-9 Units Subcutaneous TID WC   mouth rinse  15 mL Mouth Rinse 4 times per day   polyethylene glycol  17 g Oral Daily   rosuvastatin  40 mg Oral QHS   venlafaxine XR  75 mg Oral Q breakfast   Continuous Infusions:  cefTRIAXone (ROCEPHIN)  IV 2 g (08/14/23 1415)   PRN Meds:.acetaminophen, ondansetron, mouth rinse, mouth rinse    Subjective:   Tina Briggs reports significant diarrhea overnight, requesting to go back on her cholestyramine.  Objective:   Vitals:   08/15/23 0000 08/15/23 0200 08/15/23 0400 08/15/23 0752  BP: (!) 126/58  (!) 130/54 (!) 135/55  Pulse:  72 75 77  Resp: 13 13 14 17   Temp:   97.7 F (36.5 C) 98.5 F (36.9 C)  TempSrc:   Axillary Oral  SpO2: 95% 96% 98%   Weight:   89.7 kg   Height:       No intake or output data in the 24 hours ending 08/15/23 1333  Filed Weights   08/11/23 0500 08/14/23 0535 08/15/23 0400  Weight: 80 kg 86.1 kg 89.7 kg     Exam  Awake Alert, Oriented X 3, frail, deconditioned Symmetrical Chest wall movement, Good air movement bilaterally, CTAB RRR,No Gallops,Rubs or new Murmurs, No Parasternal Heave +ve B.Sounds, Abd Soft, No tenderness, No rebound - guarding or rigidity. No Cyanosis, Clubbing or edema, No new Rash or bruise in the back and right upper extremity edema   Data Reviewed:  I have personally reviewed following labs and imaging studies   CBC Lab Results  Component Value Date   WBC 9.1 08/15/2023    RBC 2.81 (L) 08/15/2023   HGB 8.2 (L) 08/15/2023   HCT 24.9 (L) 08/15/2023   MCV 88.6 08/15/2023   MCH 29.2 08/15/2023   PLT 222 08/15/2023   MCHC 32.9 08/15/2023   RDW 15.2 08/15/2023   LYMPHSABS 0.5 (L) 08/12/2023   MONOABS 0.6 08/12/2023   EOSABS 0.0 08/12/2023   BASOSABS 0.0 08/12/2023     Last metabolic panel Lab Results  Component Value Date   NA 139 08/15/2023   K 3.7 08/15/2023   CL 109 08/15/2023   CO2 21 (L) 08/15/2023   BUN 53 (H) 08/15/2023   CREATININE  2.99 (H) 08/15/2023   GLUCOSE 115 (H) 08/15/2023   GFRNONAA 16 (L) 08/15/2023   GFRAA >60 10/19/2018   CALCIUM 7.7 (L) 08/15/2023   PHOS 4.0 08/08/2023   PROT 5.0 (L) 08/15/2023   ALBUMIN <1.5 (L) 08/15/2023   LABGLOB 3.1 08/26/2017   BILITOT 0.8 08/15/2023   ALKPHOS 231 (H) 08/15/2023   AST 21 08/15/2023   ALT 26 08/15/2023   ANIONGAP 9 08/15/2023    CBG (last 3)  Recent Labs    08/14/23 2131 08/15/23 0755 08/15/23 1124  GLUCAP 126* 106* 150*      Coagulation Profile: Recent Labs  Lab 08/09/23 0630  INR 1.2     Radiology Studies: No results found.     Huey Bienenstock M.D. [facility] 08/15/2023, 1:33 PM  Available via Epic secure chat 7am-7pm After 7 pm, please refer to night coverage provider listed on amion.

## 2023-08-15 NOTE — Plan of Care (Signed)
  Problem: Clinical Measurements: Goal: Ability to maintain clinical measurements within normal limits will improve Outcome: Progressing Goal: Will remain free from infection Outcome: Progressing Goal: Diagnostic test results will improve Outcome: Progressing   Problem: Activity: Goal: Risk for activity intolerance will decrease Outcome: Progressing   Problem: Nutrition: Goal: Adequate nutrition will be maintained Outcome: Progressing   Problem: Coping: Goal: Level of anxiety will decrease Outcome: Progressing   

## 2023-08-15 NOTE — Progress Notes (Signed)
Physical Therapy Treatment Patient Details Name: Tina Briggs MRN: [id number] DOB: 05/17/1946 Today's Date: 08/15/2023   History of Present Illness 77 y/o woman presented 11/6 with abdominal pain, nausea, and confusion. She was found to have septic shock and bacteremia. ERCP 11/11. PMHx: HTN, HLD, DM, IBS, anxiety, depression.    PT Comments  Pt tolerated treatment well today. Pt able to progress ambulation in hallway today with RW CGA and a chair follow however continues to be limited by fatigue. No change in DC/DME recs at this time.  PT will continue to follow.    If plan is discharge home, recommend the following: A little help with walking and/or transfers;A little help with bathing/dressing/bathroom;Assistance with cooking/housework;Assist for transportation   Can travel by private vehicle        Equipment Recommendations  Other (comment) (Per accepting facility)    Recommendations for Other Services       Precautions / Restrictions Precautions Precautions: Fall Precaution Comments: monitor BP Restrictions Weight Bearing Restrictions: No     Mobility  Bed Mobility Overal bed mobility: Needs Assistance Bed Mobility: Supine to Sit, Sit to Supine     Supine to sit: Min assist, HOB elevated, Used rails Sit to supine: Min assist   General bed mobility comments: Min assist for pt to pull through therapist hand and assisted with scoot via bed pad to EOB.    Transfers Overall transfer level: Needs assistance Equipment used: Rolling walker (2 wheels) Transfers: Sit to/from Stand Sit to Stand: Contact guard assist           General transfer comment: Cues for hand placement    Ambulation/Gait Ambulation/Gait assistance: Contact guard assist, +2 safety/equipment (Chair follow.) Gait Distance (Feet): 40 Feet Assistive device: Rolling walker (2 wheels) Gait Pattern/deviations: Decreased stride length, Step-through pattern Gait velocity: slow Gait velocity  interpretation: <1.31 ft/sec, indicative of household ambulator   General Gait Details: Pt overall steady with RW however chair follow provided for safety. no LOB noted however pt noted to be very fatigued upon returning to bed.   Stairs             Wheelchair Mobility     Tilt Bed    Modified Rankin (Stroke Patients Only)       Balance Overall balance assessment: Needs assistance Sitting-balance support: No upper extremity supported, Feet supported Sitting balance-Leahy Scale: Fair     Standing balance support: Bilateral upper extremity supported Standing balance-Leahy Scale: Poor                              Cognition Arousal: Alert Behavior During Therapy: WFL for tasks assessed/performed Overall Cognitive Status: Within Functional Limits for tasks assessed                                          Exercises      General Comments General comments (skin integrity, edema, etc.): VSS      Pertinent Vitals/Pain Pain Assessment Pain Assessment: No/denies pain    Home Living                          Prior Function            PT Goals (current goals can now be found in the care plan section) Progress towards PT goals:  Progressing toward goals    Frequency    Min 1X/week      PT Plan      Co-evaluation              AM-PAC PT "6 Clicks" Mobility   Outcome Measure  Help needed turning from your back to your side while in a flat bed without using bedrails?: A Little Help needed moving from lying on your back to sitting on the side of a flat bed without using bedrails?: A Little Help needed moving to and from a bed to a chair (including a wheelchair)?: A Little Help needed standing up from a chair using your arms (e.g., wheelchair or bedside chair)?: A Little Help needed to walk in hospital room?: A Little Help needed climbing 3-5 steps with a railing? : A Lot 6 Click Score: 17    End of Session  Equipment Utilized During Treatment: Gait belt Activity Tolerance: Patient tolerated treatment well Patient left: in bed;with call bell/phone within reach;with family/visitor present Nurse Communication: Mobility status PT Visit Diagnosis: Unsteadiness on feet (R26.81);Muscle weakness (generalized) (M62.81);Difficulty in walking, not elsewhere classified (R26.2)     Time: 4034-7425 PT Time Calculation (min) (ACUTE ONLY): 23 min  Charges:    $Gait Training: 23-37 mins PT General Charges $$ ACUTE PT VISIT: 1 Visit                     Shela Nevin, PT, DPT Acute Rehab Services [phone]    [name] 08/15/2023, 2:56 PM

## 2023-08-15 NOTE — Progress Notes (Addendum)
  Inpatient Rehabilitation Admissions Coordinator   Met with patient at bedside for rehab assessment. We discussed goals and expectations of a possible CIR admit. She understands that she would have to demonstrate increased tolerance to participate before consideration for possible Cir admit. We will follow up on Monday.Please refer to Dr Rosalyn Charters consult. I also discussed that she may be able to progress to d/c directly home if continues to improve. Please call me with any questions.   Ottie Glazier, RN, MSN Rehab Admissions Coordinator [phone]

## 2023-08-15 NOTE — Progress Notes (Signed)
Patient stated that she has been having multiple bouts of diarrhea, which is her normal at home and she takes colestipol regularly at home for it.    I sent a secure chat to Dr. Randol Kern to see if he would like to order this medication during her inpatient stay.  Medication was ordered.  I advised patient that it may take a few minutes for pharmacy to verify medication.  Patient then stated that her husband brought her some and she already took those.    I advised patient about not bringing in medications from home for safety reasons.

## 2023-08-15 NOTE — Consult Note (Signed)
Physical Medicine and Rehabilitation Consult Reason for Consult:significant weakness after prolonged medical stay Referring Physician: Elgergawy   HPI: Tina Briggs is a 77 y.o. female with a history of HTN, DM, IBS, CKD IIIb, anxiety/depression who presented on 08/06/23 after 3 days of nausea, diarrhea and increasing confusion. CT of the abdomen and pelvis revealed potential sigmoid diverticulitis and pt was started on IV zosyn and IVF. She was admitted with sepsis and hypovolemic shock. Further work up revealed intrahepatic duct dilatation with choledocholithiasis/cholangitis.  MRCP was performed which confirmed choledocholithiasis with possible biliary stricture. ERCP was performed on 11/11 with sphincterotomy, stone extraction and temporary biliary stent replacement.  PT has struggled with poor appetite and intake, ongoing diarrhea, pain. Had a single episode of a fib with RVR which was felt to be due to septic shock. No a/c recommended. Pt currently is on IV ceftriaxone 2g daily.  Therapy has worked with her and on 11/11 she was min assist for sit to std but did not ambulate. She was unable to participate in PT yesterday due diarrhea, pain, weakness but did some light work with OT. She lives in [place] with her husband in a one level home with 1 STE. She was independent PTA.   Review of Systems  Constitutional:  Positive for malaise/fatigue and weight loss.  HENT: Negative.    Eyes: Negative.   Respiratory: Negative.    Cardiovascular:  Positive for leg swelling.  Gastrointestinal:  Positive for diarrhea, nausea and vomiting.  Genitourinary: Negative.   Musculoskeletal:  Positive for back pain, falls and myalgias.  Skin: Negative.   Neurological:  Positive for dizziness and weakness. Negative for headaches.  Psychiatric/Behavioral:  The patient is nervous/anxious.    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 Mild to mod TI scarring. (Dr. Eliott Nine)   HNP (herniated nucleus pulposus), lumbar    Recurrent   HYPERLIPIDEMIA-MIXED    HYPERTENSION, UNSPECIFIED    Hypertensive retinopathy    Injury of right hip    Lumbar back pain    Macular degeneration    Microscopic hematuria    Neuropathy    OBESITY    Plantar fasciitis    PONV (postoperative nausea and vomiting)    past history only, "sometimes they put a patch on me"   Right eye injury    Past Surgical History:  Procedure Laterality Date   ABDOMINAL HYSTERECTOMY     APPENDECTOMY     BACK SURGERY     lumbar '09"microdiscectomy"   BILIARY STENT PLACEMENT  08/11/2023   Procedure: BILIARY STENT PLACEMENT;  Surgeon: Vida Rigger, MD;  Location: [facility] ENDOSCOPY;  Service: Gastroenterology;;  10x7   BREAST BIOPSY     BREAST BIOPSY Right 05/19/2023   MM RT BREAST BX W LOC DEV EA AD LESION IMG BX SPEC STEREO GUIDE 05/19/2023 [facility]   BREAST BIOPSY Right 05/19/2023   MM RT BREAST BX W LOC DEV 1ST LESION IMAGE BX SPEC  STEREO GUIDE 05/19/2023 [facility]   breat Bx benign     CHOLECYSTECTOMY     open   COLONOSCOPY WITH PROPOFOL N/A 11/22/2014   Procedure: COLONOSCOPY WITH PROPOFOL;  Surgeon: Charolett Bumpers, MD;  Location: [facility] ENDOSCOPY;  Service: Endoscopy;  Laterality: N/A;   ERCP N/A 05/26/2015   Procedure: ENDOSCOPIC RETROGRADE CHOLANGIOPANCREATOGRAPHY (ERCP)   (DOING CASE IN MAIN OR);  Surgeon: Dorena Cookey, MD;  Location: [facility] ENDOSCOPY;  Service: Gastroenterology;  Laterality: N/A;   ERCP N/A 06/06/2015   Procedure: ENDOSCOPIC RETROGRADE CHOLANGIOPANCREATOGRAPHY (ERCP);   Surgeon: Vida Rigger, MD;  Location: [facility] ENDOSCOPY;  Service: Endoscopy;  Laterality: N/A;   ERCP N/A 01/12/2021   Procedure: ENDOSCOPIC RETROGRADE CHOLANGIOPANCREATOGRAPHY (ERCP);  Surgeon: Vida Rigger, MD;  Location: [facility] ENDOSCOPY;  Service: Endoscopy;  Laterality: N/A;   ERCP N/A 08/11/2023   Procedure: ENDOSCOPIC RETROGRADE CHOLANGIOPANCREATOGRAPHY (ERCP);  Surgeon: Vida Rigger, MD;  Location: [facility] ENDOSCOPY;  Service: Gastroenterology;  Laterality: N/A;   ESOPHAGOGASTRODUODENOSCOPY (EGD) WITH PROPOFOL N/A 11/22/2014   Procedure: ESOPHAGOGASTRODUODENOSCOPY (EGD) WITH PROPOFOL;  Surgeon: Charolett Bumpers, MD;  Location: [facility] ENDOSCOPY;  Service: Endoscopy;  Laterality: N/A;   EYE SURGERY Bilateral    cataracts   GANGLION CYST EXCISION Right    LIPOMA EXCISION  1993   From thigh 1993   LUMBAR FUSION  09/16/2018   LUMBAR LAMINECTOMY/DECOMPRESSION MICRODISCECTOMY Left 12/31/2017   Procedure: Laminectomy and Foraminotomy - Lumbar Three-Four - left Extraforaminal diskectomy Lumbar Four-Five left;  Surgeon: Tia Alert, MD;  Location: [facility] OR;  Service: Neurosurgery;  Laterality: Left;   PERONEAL NERVE DECOMPRESSION Left 12/31/2017   Procedure: Left PERONEAL NERVE DECOMPRESSION;  Surgeon: Tia Alert, MD;  Location: [facility] OR;  Service: Neurosurgery;  Laterality: Left;  left   PERONEAL NERVE DECOMPRESSION     REMOVAL OF STONES  01/12/2021   Procedure: REMOVAL OF STONES;  Surgeon: Vida Rigger, MD;  Location: [facility] ENDOSCOPY;  Service: Endoscopy;;   REMOVAL OF STONES  08/11/2023   Procedure: REMOVAL OF STONES;  Surgeon: Vida Rigger, MD;  Location: [facility] ENDOSCOPY;  Service: Gastroenterology;;   Right ganglion cyst     Right ganglion cyst remove     SPHINCTEROTOMY  08/11/2023   Procedure: SPHINCTEROTOMY;  Surgeon: Vida Rigger, MD;  Location: [facility] ENDOSCOPY;  Service: Gastroenterology;;   TUBAL LIGATION     Family History  Problem Relation Age of Onset   Cancer Maternal Aunt        breast cancer   Cancer  Maternal Grandmother        kidney cancer    Cancer Paternal Grandmother        bone cancer    Heart disease Mother    Alzheimer's disease Father    Tremor Father        possible PD   Diabetes Mellitus II Brother    Healthy Son    Healthy Son    Social History:  reports that she has never smoked. She has never used smokeless tobacco. She reports that she does not drink alcohol and does not use drugs. Allergies:  Allergies  Allergen Reactions   Codeine Shortness Of Breath   Clavulanic Acid     Pt tolerated Zosyn 11/6   Flagyl [Metronidazole]    Other Other (See Comments)   Prevacid [Lansoprazole]    Tetracyclines & Related Itching   Trulicity [Dulaglutide]    Azithromycin Itching and Rash   Erythromycin Itching and Rash   Morphine And Codeine Itching and Rash   Septra [  Sulfamethoxazole-Trimethoprim] Itching and Rash   Medications Prior to Admission  Medication Sig Dispense Refill   acetaminophen (TYLENOL) 500 MG tablet Take 1,000 mg by mouth 2 (two) times daily as needed for moderate pain or headache.      cholecalciferol (VITAMIN D) 1000 units tablet Take 5,000 Units by mouth daily.     colestipol (COLESTID) 1 g tablet Take 2 tablets (2 g total) by mouth in the morning and at bedtime. (Patient taking differently: Take 2 g by mouth in the morning and at bedtime. *usually takes 1 qam and 2 qpm*) 120 tablet 12   CRESTOR 40 MG tablet TAKE 1 TABLET AT BEDTIME (Patient taking differently: Take 40 mg by mouth at bedtime.) 90 tablet 1   dapagliflozin propanediol (FARXIGA) 5 MG TABS tablet Take 5 mg by mouth daily.     desloratadine (CLARINEX) 5 MG tablet Take 1 tablet (5 mg total) by mouth daily. 90 tablet 1   diclofenac Sodium (VOLTAREN) 1 % GEL Apply 4 g topically 4 (four) times daily. 100 g 0   fluticasone (FLONASE) 50 MCG/ACT nasal spray Place 2 sprays into both nostrils daily as needed for allergies.      gabapentin (NEURONTIN) 300 MG capsule Take 300-600 mg by mouth See admin  instructions. 300mg  in the morning 600mg  at night     losartan (COZAAR) 50 MG tablet Take 1 tablet (50 mg total) by mouth 2 (two) times daily. 60 tablet 5   Multiple Vitamins-Minerals (PRESERVISION AREDS 2 PO) Take 1 tablet by mouth 2 (two) times daily.     NOVOLOG FLEXPEN RELION 100 UNIT/ML FlexPen Inject 8-10 Units into the skin 3 (three) times daily as needed. BS     rosuvastatin (CRESTOR) 40 MG tablet Take 1 tablet (40 mg total) by mouth at bedtime. 90 tablet 3   Semaglutide,0.25 or 0.5MG /DOS, (OZEMPIC, 0.25 OR 0.5 MG/DOSE,) 2 MG/3ML SOPN Inject 0.5 mg into the skin once a week. 3 mL 1   venlafaxine XR (EFFEXOR-XR) 75 MG 24 hr capsule Take 75 mg by mouth every morning.      vitamin B-12 (CYANOCOBALAMIN) 1000 MCG tablet Take 1,000 mcg by mouth 3 (three) times a week. Take on Sundays, Wednesdays and Fridays.     cephALEXin (KEFLEX) 500 MG capsule Take 1 capsule (500 mg total) by mouth 3 (three) times daily. (Patient not taking: Reported on 08/06/2023) 21 capsule 1   FREESTYLE LITE test strip USE 1 STRIP TO CHECK GLUCOSE 4 TIMES DAILY     methocarbamol (ROBAXIN) 500 MG tablet Take 1 tablet (500 mg total) by mouth 2 (two) times daily. (Patient not taking: Reported on 08/06/2023) 5 tablet 0   methylPREDNISolone (MEDROL DOSEPAK) 4 MG TBPK tablet Day 1: 8mg  before breakfast, 4 mg after lunch, 4 mg after supper, and 8 mg at bedtime Day 2: 4 mg before breakfast, 4 mg after lunch, 4 mg  after supper, and 8 mg  at bedtime Day 3:  4 mg  before breakfast, 4 mg  after lunch, 4 mg after supper, and 4 mg  at bedtime Day 4: 4 mg  before breakfast, 4 mg  after lunch, and 4 mg at bedtime Day 5: 4 mg  before breakfast and 4 mg at bedtime Day 6: 4 mg  before breakfast (Patient not taking: Reported on 08/06/2023) 1 each 0   metoprolol tartrate (LOPRESSOR) 100 MG tablet Take one tablet by mouth 2 hours prior to your CT (Patient not taking: Reported on 08/06/2023) 1 tablet 0  Home: Home Living Family/patient expects to  be discharged to:: Private residence Living Arrangements: Spouse/significant other Available Help at Discharge: Family Type of Home: Other(Comment) (townhouse) Home Access: Level entry Home Layout: One level Bathroom Shower/Tub: Health visitor: Standard Bathroom Accessibility: Yes Home Equipment: Grab bars - toilet, Grab bars - tub/shower, Agricultural consultant (2 wheels)  Functional History: Prior Function Prior Level of Function : Independent/Modified Independent, Driving Mobility Comments: no AD ADLs Comments: plays piano at church, gardens some. typically completely independent Functional Status:  Mobility: Bed Mobility Overal bed mobility: Needs Assistance Bed Mobility: Supine to Sit Supine to sit: Min assist, HOB elevated, Used rails General bed mobility comments: Min assist for pt to pull through therapist hand and assisted with scoot via bed pad to EOB. Transfers Overall transfer level: Needs assistance Equipment used: Rolling walker (2 wheels) Transfers: Sit to/from Stand Sit to Stand: Min assist Bed to/from chair/wheelchair/BSC transfer type:: Step pivot Step pivot transfers: Contact guard assist General transfer comment: Min assist for boost and light balance, increased sway upon rising but denies any dizziness. RW for support. Ambulation/Gait Ambulation/Gait assistance: Min assist Gait Distance (Feet): 18 Feet Assistive device: Rolling walker (2 wheels) Gait Pattern/deviations: Step-through pattern, Decreased stride length, Knees buckling, Shuffle General Gait Details: Min assist for RW control, VC for continuous steps. Intermittent freezing, delayed processing with sequencing and anticipating navigating around obstacles in room. Very mild knee instability, able to self correct with RW use. Very fatigued at end of distance but VSS throughout. Gait velocity: slow Gait velocity interpretation: <1.31 ft/sec, indicative of household ambulator     ADL: ADL Overall ADL's : Needs assistance/impaired Eating/Feeding: Set up, Sitting Grooming: Contact guard assist, Standing, Wash/dry hands, Oral care Grooming Details (indicate cue type and reason): CGA to wash hands standing at sink reaching to soap dispenser on wall. Fatigued after this and completed oral care seated at sink Upper Body Bathing: Minimal assistance, Sitting Lower Body Bathing: Moderate assistance, Sit to/from stand, Sitting/lateral leans Upper Body Dressing : Minimal assistance, Sitting Lower Body Dressing: Moderate assistance, Sitting/lateral leans, Sit to/from stand Toilet Transfer: Contact guard assist, Rolling walker (2 wheels), BSC/3in1 Toilet Transfer Details (indicate cue type and reason): CGA to stand from [facility] fairly easily and mobilize to sink for ADLs Toileting- Clothing Manipulation and Hygiene: Minimal assistance, Sitting/lateral lean, Sit to/from stand Functional mobility during ADLs: Minimal assistance, Rolling walker (2 wheels) General ADL Comments: Planned education of UE HEP in this session; limited to bed level due to pt reported continuous fatigue (pt unsure why and frustrated by difficulty in staying awake)  Cognition: Cognition Overall Cognitive Status: Within Functional Limits for tasks assessed Orientation Level: Oriented X4 Cognition Arousal: Alert Behavior During Therapy: WFL for tasks assessed/performed Overall Cognitive Status: Within Functional Limits for tasks assessed General Comments: sleeping on entry but easily awakened and maintains alertness during activity  Blood pressure (!) 135/55, pulse 77, temperature 98.5 F (36.9 C), temperature source Oral, resp. rate 17, height 5\' 3"  (1.6 m), weight 89.7 kg, SpO2 98%. Physical Exam Constitutional:      General: She is not in acute distress.    Appearance: She is not ill-appearing.  HENT:     Head: Normocephalic and atraumatic.     Right Ear: External ear normal.     Left Ear:  External ear normal.     Nose: Nose normal.     Mouth/Throat:     Mouth: Mucous membranes are moist.  Eyes:     Extraocular Movements: Extraocular  movements intact.     Pupils: Pupils are equal, round, and reactive to light.  Cardiovascular:     Rate and Rhythm: Normal rate.  Pulmonary:     Effort: Pulmonary effort is normal.  Abdominal:     Palpations: Abdomen is soft.     Tenderness: There is abdominal tenderness.  Musculoskeletal:        General: Tenderness (Right arm, right leg with AROM/PROM) present. Swelling: RUE from infiltrated IV.    Right lower leg: Edema present.     Left lower leg: Edema present.  Skin:    General: Skin is warm.     Findings: Bruising present.  Neurological:     Mental Status: She is alert.     Comments: Alert and oriented x 3. Normal insight and awareness. Intact Memory. Normal language and speech. Cranial nerve exam unremarkable. MMT: LUE 5/5. RUE 3+ to 4/5 due to pain,discomfort. LLE 4-/5 prox to 5/5 distal. RLE 3- prox to 4/5 distally with pain inhibition. Sensory exam normal for light touch and pain in all 4 limbs. No limb ataxia or cerebellar signs. No abnormal tone appreciated.  Marland Kitchen    Psychiatric:     Comments: May have been a little anxious but overall very pleasant and appropriate.     Results for orders placed or performed during the hospital encounter of 08/06/23 (from the past 24 hour(s))  Glucose, capillary     Status: Abnormal   Collection Time: 08/14/23 12:06 PM  Result Value Ref Range   Glucose-Capillary 121 (H) 70 - 99 mg/dL  Glucose, capillary     Status: None   Collection Time: 08/14/23  4:17 PM  Result Value Ref Range   Glucose-Capillary 74 70 - 99 mg/dL  Glucose, capillary     Status: Abnormal   Collection Time: 08/14/23  9:31 PM  Result Value Ref Range   Glucose-Capillary 126 (H) 70 - 99 mg/dL  CBC     Status: Abnormal   Collection Time: 08/15/23  3:04 AM  Result Value Ref Range   WBC 9.1 4.0 - 10.5 K/uL   RBC 2.81 (L)  3.87 - 5.11 MIL/uL   Hemoglobin 8.2 (L) 12.0 - 15.0 g/dL   HCT 16.1 (L) 09.6 - 04.5 %   MCV 88.6 80.0 - 100.0 fL   MCH 29.2 26.0 - 34.0 pg   MCHC 32.9 30.0 - 36.0 g/dL   RDW 40.9 81.1 - 91.4 %   Platelets 222 150 - 400 K/uL   nRBC 0.0 0.0 - 0.2 %  Basic metabolic panel     Status: Abnormal   Collection Time: 08/15/23  3:04 AM  Result Value Ref Range   Sodium 139 135 - 145 mmol/L   Potassium 3.7 3.5 - 5.1 mmol/L   Chloride 109 98 - 111 mmol/L   CO2 21 (L) 22 - 32 mmol/L   Glucose, Bld 115 (H) 70 - 99 mg/dL   BUN 53 (H) 8 - 23 mg/dL   Creatinine, Ser 7.82 (H) 0.44 - 1.00 mg/dL   Calcium 7.7 (L) 8.9 - 10.3 mg/dL   GFR, Estimated 16 (L) >60 mL/min   Anion gap 9 5 - 15  Hepatic function panel     Status: Abnormal   Collection Time: 08/15/23  3:04 AM  Result Value Ref Range   Total Protein 5.0 (L) 6.5 - 8.1 g/dL   Albumin <9.5 (L) 3.5 - 5.0 g/dL   AST 21 15 - 41 U/L   ALT 26 0 -  44 U/L   Alkaline Phosphatase 231 (H) 38 - 126 U/L   Total Bilirubin 0.8 <1.2 mg/dL   Bilirubin, Direct 0.5 (H) 0.0 - 0.2 mg/dL   Indirect Bilirubin 0.3 0.3 - 0.9 mg/dL  Glucose, capillary     Status: Abnormal   Collection Time: 08/15/23  7:55 AM  Result Value Ref Range   Glucose-Capillary 106 (H) 70 - 99 mg/dL  Glucose, capillary     Status: Abnormal   Collection Time: 08/15/23 11:24 AM  Result Value Ref Range   Glucose-Capillary 150 (H) 70 - 99 mg/dL   No results found.  Assessment/Plan: Diagnosis: significant pain and ongoing weakness due to septic shock from ascending cholangitis Does the need for close, 24 hr/day medical supervision in concert with the patient's rehab needs make it unreasonable for this patient to be served in a less intensive setting? Yes Co-Morbidities requiring supervision/potential complications:  -ongoing antibiotic rx -nutritional and electrolyte mgt -mgt of diarrhea and volume status -acute on chronic kidney injury -pain control -anxiety with depression Due to  bladder management, bowel management, safety, skin/wound care, disease management, medication administration, pain management, and patient education, does the patient require 24 hr/day rehab nursing? Yes Does the patient require coordinated care of a physician, rehab nurse, therapy disciplines of PT, OT to address physical and functional deficits in the context of the above medical diagnosis(es)? Yes Addressing deficits in the following areas: balance, endurance, locomotion, strength, transferring, bowel/bladder control, bathing, dressing, feeding, grooming, toileting, and psychosocial support Can the patient actively participate in an intensive therapy program of at least 3 hrs of therapy per day at least 5 days per week?  She should be able to over the next few days The potential for patient to make measurable gains while on inpatient rehab is excellent Anticipated functional outcomes upon discharge from inpatient rehab are modified independent and supervision  with PT, modified independent and supervision with OT, n/a with SLP. Estimated rehab length of stay to reach the above functional goals is: 10-13 days Anticipated discharge destination: Home Overall Rehab/Functional Prognosis: excellent  POST ACUTE RECOMMENDATIONS: This patient's condition is appropriate for continued rehabilitative care in the following setting: CIR Patient has agreed to participate in recommended program. Yes Note that insurance prior authorization may be required for reimbursement for recommended care.  Comment: I spoke at length with Mrs. Riggan and her husband. She is aware that she will need to demonstrate more activity tolerance in order to be admitted to inpatient rehab. I expect that to happen over the next few days. In the meantime, we will follow along for her progress    I have personally performed a face to face diagnostic evaluation of this patient. Additionally, I have examined the patient's medical  record including any pertinent labs and radiographic images. If the physician assistant has documented in this note, I have reviewed and edited or otherwise concur with the physician assistant's documentation.  Thanks,  Ranelle Oyster, MD 08/15/2023

## 2023-08-16 DIAGNOSIS — R6521 Severe sepsis with septic shock: Secondary | ICD-10-CM | POA: Diagnosis not present

## 2023-08-16 DIAGNOSIS — A419 Sepsis, unspecified organism: Secondary | ICD-10-CM | POA: Diagnosis not present

## 2023-08-16 LAB — GLUCOSE, CAPILLARY
Glucose-Capillary: 110 mg/dL — ABNORMAL HIGH (ref 70–99)
Glucose-Capillary: 168 mg/dL — ABNORMAL HIGH (ref 70–99)
Glucose-Capillary: 119 mg/dL — ABNORMAL HIGH (ref 70–99)
Glucose-Capillary: 102 mg/dL — ABNORMAL HIGH (ref 70–99)

## 2023-08-16 LAB — BASIC METABOLIC PANEL
Anion gap: 8 (ref 5–15)
BUN: 40 mg/dL — ABNORMAL HIGH (ref 8–23)
CO2: 22 mmol/L (ref 22–32)
Calcium: 7.8 mg/dL — ABNORMAL LOW (ref 8.9–10.3)
Chloride: 111 mmol/L (ref 98–111)
Creatinine, Ser: 2.39 mg/dL — ABNORMAL HIGH (ref 0.44–1.00)
GFR, Estimated: 21 mL/min — ABNORMAL LOW (ref >60)
Glucose, Bld: 140 mg/dL — ABNORMAL HIGH (ref 70–99)
Potassium: 3.7 mmol/L (ref 3.5–5.1)
Sodium: 141 mmol/L (ref 135–145)

## 2023-08-16 LAB — CBC
HCT: 24.1 % — ABNORMAL LOW (ref 36.0–46.0)
Hemoglobin: 7.7 g/dL — ABNORMAL LOW (ref 12.0–15.0)
MCH: 29.2 pg (ref 26.0–34.0)
MCHC: 32 g/dL (ref 30.0–36.0)
MCV: 91.3 fL (ref 80.0–100.0)
Platelets: 216 10*3/uL (ref 150–400)
RBC: 2.64 MIL/uL — ABNORMAL LOW (ref 3.87–5.11)
RDW: 15.2 % (ref 11.5–15.5)
WBC: 6.3 10*3/uL (ref 4.0–10.5)
nRBC: 0 % (ref 0.0–0.2)

## 2023-08-16 MED ORDER — METOPROLOL TARTRATE 25 MG PO TABS
25.0000 mg | ORAL_TABLET | Freq: Two times a day (BID) | ORAL | Status: DC
  Administered 2023-08-16 – 2023-08-17 (×3): 25 mg via ORAL
  Filled 2023-08-16 (×3): qty 1

## 2023-08-16 NOTE — Plan of Care (Signed)
  Problem: Health Behavior/Discharge Planning: Goal: Ability to manage health-related needs will improve Outcome: Progressing   Problem: Clinical Measurements: Goal: Ability to maintain clinical measurements within normal limits will improve Outcome: Progressing   Problem: Nutrition: Goal: Adequate nutrition will be maintained Outcome: Progressing   

## 2023-08-16 NOTE — Progress Notes (Signed)
[facility]                                                                               Tina Briggs, is a 77 y.o. female, DOB - Mar 18, 1946, ZOX:[id number] Admit date - 08/06/2023    Outpatient Primary MD for the patient is [name], MD  LOS - 10  days    Brief summary   77 year old lady with prior history of hypertension hyperlipidemia, diabetes mellitus, IBS, anxiety and depression presents to ED for nausea diarrhea and confusion for 3 days was found to be hypotensive on admission.  CT abdomen pelvis suggestive of sigmoid diverticulitis started on IV Zosyn and IV fluids.  She was initially admitted to [facility] service for sepsis and hypovolemic shock and transferred to [facility] on 08/13/2023.   Assessment & Plan    Assessment and Plan:  Sepsis/hypovolemic shock secondary to E. coli bacteremia from ascending cholangitis Acute uncomplicated sigmoid diverticulitis with severe diarrhea C. difficile PCR is negative Currently on IV antibiotics until 11/18, will transition to p.o. after that She was started on diet and advance as tolerated.  Appetite has been improving Patient reports significant diarrhea at home at baseline, which was resumed on her home dose cholestyramine, improving   Intrahepatic biliary duct dilatation/choledocholithiasis/cholangitis MRCP showing choledocholithiasis with possible biliary stricture Underwent ERCP with sphincterotomy with stone extraction as well as temporary plastic biliary stent placement on 11/11. Patient will likely need repeat ERCP  with spyglass for further evaluation of possible remaining CBD stones and evaluation of biliary stricture to be scheduled as outpatient. Liver enzymes improving. Bilirubin at 1.8 and alk phos stabilized at 232.  Stable on repeat today. -Status post laparoscopic cholecystectomy in the past   Acute kidney injury on stage IIIb CKD Patient's creatinine between 2.5-3.5 Continue to improve.  It is  2.39 today.  Hyponatremia -Improving  Severe hypoalbuminemia:  PCM Significantly low albumin, will consult nutritionist Started on supplements.   New onset atrial fibrillation with RVR Appears to be single episode, no recurrence in the setting of septic shock with no recurrence, will hold anticoagulation at this point as risks outweighed benefits   Type 2 diabetes mellitus A1c of 5.9 Holding patient's semaglutide and farxiga at this time.   History of anxiety and depression  continue with Effexor  Right upper extremity swelling:  From IV infiltration. Venous duplex negative for SVT or DVT.  Elevate the arm and monitor.  Apply warm compress  Hypertension -Pressure initially low-soft, so home meds including losartan and metoprolol been on hold . -blood pressure started to increase, will resume on metoprolol 25 mg p.o. twice daily (home dose is 100 mg p.o. twice daily)   Estimated body mass index is 33.7 kg/m as calculated from the following:   Height as of this encounter: 5\' 3"  (1.6 m).   Weight as of this encounter: 86.3 kg.  Code Status: Full code DVT Prophylaxis:  heparin injection 5,000 Units Start: 08/12/23 1400 SCDs Start: 08/06/23 2011   Level of Care: Level of care: Progressive Family Communication: None at best side  Disposition Plan:     Remains inpatient appropriate: IV antibiotics for ascending cholangitis Procedures:  ERCP with  sphincterotomy with stone extraction and temporary plastic biliary stent placement on 11/11 MRI of the abdomen  Consultants:   Gastroenterology.  PCCM.   Antimicrobials:   Anti-infectives (From admission, onward)    Start     Dose/Rate Route Frequency Ordered Stop   08/08/23 1400  cefTRIAXone (ROCEPHIN) 2 g in sodium chloride 0.9 % 100 mL IVPB        2 g 200 mL/hr over 30 Minutes Intravenous Every 24 hours 08/08/23 1059 08/17/23 2359   08/06/23 2200  piperacillin-tazobactam (ZOSYN) IVPB 2.25 g  Status:  Discontinued         2.25 g 100 mL/hr over 30 Minutes Intravenous Every 8 hours 08/06/23 2015 08/08/23 1059   08/06/23 1515  clindamycin (CLEOCIN) IVPB 300 mg  Status:  Discontinued        300 mg 100 mL/hr over 30 Minutes Intravenous  Once 08/06/23 1511 08/06/23 1512   08/06/23 1515  piperacillin-tazobactam (ZOSYN) IVPB 3.375 g        3.375 g 100 mL/hr over 30 Minutes Intravenous  Once 08/06/23 1512 08/06/23 1613        Medications  Scheduled Meds:  Chlorhexidine Gluconate Cloth  6 each Topical Daily   colestipol  2 g Oral BID   feeding supplement  237 mL Oral BID BM   gabapentin  100 mg Oral BID   heparin injection (subcutaneous)  5,000 Units Subcutaneous Q8H   insulin aspart  0-5 Units Subcutaneous QHS   insulin aspart  0-9 Units Subcutaneous TID WC   mouth rinse  15 mL Mouth Rinse 4 times per day   rosuvastatin  40 mg Oral QHS   venlafaxine XR  75 mg Oral Q breakfast   Continuous Infusions:  cefTRIAXone (ROCEPHIN)  IV 2 g (08/15/23 1516)   PRN Meds:.acetaminophen, ondansetron, mouth rinse, mouth rinse    Subjective:   Tina Briggs reports significant diarrhea overnight, requesting to go back on her cholestyramine.  Objective:   Vitals:   08/16/23 0443 08/16/23 0500 08/16/23 0600 08/16/23 0800  BP: (!) 135/55   (!) 134/51  Pulse: 68 67 63 65  Resp: 14 12 13 12   Temp: 98 F (36.7 C)   98 F (36.7 C)  TempSrc: Oral   Oral  SpO2: 95% 95% 98% 96%  Weight:  86.3 kg    Height:       No intake or output data in the 24 hours ending 08/16/23 1150  Filed Weights   08/14/23 0535 08/15/23 0400 08/16/23 0500  Weight: 86.1 kg 89.7 kg 86.3 kg     Exam  Awake Alert, Oriented X 3, No new F.N deficits, Normal affect Symmetrical Chest wall movement, Good air movement bilaterally, CTAB RRR,No Gallops,Rubs or new Murmurs, No Parasternal Heave +ve B.Sounds, Abd Soft, No tenderness, No rebound - guarding or rigidity. No Cyanosis, Clubbing or edema, upper extremity edema  improving    Data Reviewed:  I have personally reviewed following labs and imaging studies   CBC Lab Results  Component Value Date   WBC 6.3 08/16/2023   RBC 2.64 (L) 08/16/2023   HGB 7.7 (L) 08/16/2023   HCT 24.1 (L) 08/16/2023   MCV 91.3 08/16/2023   MCH 29.2 08/16/2023   PLT 216 08/16/2023   MCHC 32.0 08/16/2023   RDW 15.2 08/16/2023   LYMPHSABS 0.5 (L) 08/12/2023   MONOABS 0.6 08/12/2023   EOSABS 0.0 08/12/2023   BASOSABS 0.0 08/12/2023     Last metabolic panel Lab Results  Component Value  Date   NA 141 08/16/2023   K 3.7 08/16/2023   CL 111 08/16/2023   CO2 22 08/16/2023   BUN 40 (H) 08/16/2023   CREATININE 2.39 (H) 08/16/2023   GLUCOSE 140 (H) 08/16/2023   GFRNONAA 21 (L) 08/16/2023   GFRAA >60 10/19/2018   CALCIUM 7.8 (L) 08/16/2023   PHOS 4.3 08/15/2023   PROT 5.0 (L) 08/15/2023   ALBUMIN <1.5 (L) 08/15/2023   LABGLOB 3.1 08/26/2017   BILITOT 0.8 08/15/2023   ALKPHOS 231 (H) 08/15/2023   AST 21 08/15/2023   ALT 26 08/15/2023   ANIONGAP 8 08/16/2023    CBG (last 3)  Recent Labs    08/15/23 1622 08/15/23 2124 08/16/23 0817  GLUCAP 139* 85 110*      Coagulation Profile: No results for input(s): "INR", "PROTIME" in the last 168 hours.    Radiology Studies: No results found.     Huey Bienenstock M.D. [facility] 08/16/2023, 11:50 AM  Available via Epic secure chat 7am-7pm After 7 pm, please refer to night coverage provider listed on amion.

## 2023-08-16 NOTE — Plan of Care (Signed)

## 2023-08-16 NOTE — Plan of Care (Signed)
  Problem: Education: Goal: Knowledge of General Education information will improve Description: Including pain rating scale, medication(s)/side effects and non-pharmacologic comfort measures Outcome: Progressing   Problem: Health Behavior/Discharge Planning: Goal: Ability to manage health-related needs will improve Outcome: Progressing   Problem: Clinical Measurements: Goal: Ability to maintain clinical measurements within normal limits will improve Outcome: Progressing Goal: Will remain free from infection Outcome: Progressing Goal: Diagnostic test results will improve Outcome: Progressing Goal: Cardiovascular complication will be avoided Outcome: Progressing   Problem: Activity: Goal: Risk for activity intolerance will decrease Outcome: Progressing   Problem: Nutrition: Goal: Adequate nutrition will be maintained Outcome: Progressing   Problem: Pain Management: Goal: General experience of comfort will improve Outcome: Progressing   Problem: Safety: Goal: Ability to remain free from injury will improve Outcome: Progressing   Problem: Education: Goal: Ability to describe self-care measures that may prevent or decrease complications (Diabetes Survival Skills Education) will improve Outcome: Progressing   Problem: Fluid Volume: Goal: Ability to maintain a balanced intake and output will improve Outcome: Progressing   Problem: Nutritional: Goal: Maintenance of adequate nutrition will improve Outcome: Progressing

## 2023-08-17 DIAGNOSIS — A419 Sepsis, unspecified organism: Secondary | ICD-10-CM | POA: Diagnosis not present

## 2023-08-17 DIAGNOSIS — R6521 Severe sepsis with septic shock: Secondary | ICD-10-CM | POA: Diagnosis not present

## 2023-08-17 LAB — BASIC METABOLIC PANEL
Anion gap: 7 (ref 5–15)
BUN: 32 mg/dL — ABNORMAL HIGH (ref 8–23)
CO2: 22 mmol/L (ref 22–32)
Calcium: 7.6 mg/dL — ABNORMAL LOW (ref 8.9–10.3)
Chloride: 109 mmol/L (ref 98–111)
Creatinine, Ser: 2.03 mg/dL — ABNORMAL HIGH (ref 0.44–1.00)
GFR, Estimated: 25 mL/min — ABNORMAL LOW (ref >60)
Glucose, Bld: 101 mg/dL — ABNORMAL HIGH (ref 70–99)
Potassium: 3.8 mmol/L (ref 3.5–5.1)
Sodium: 138 mmol/L (ref 135–145)

## 2023-08-17 LAB — CBC
HCT: 23.5 % — ABNORMAL LOW (ref 36.0–46.0)
Hemoglobin: 7.5 g/dL — ABNORMAL LOW (ref 12.0–15.0)
MCH: 29.1 pg (ref 26.0–34.0)
MCHC: 31.9 g/dL (ref 30.0–36.0)
MCV: 91.1 fL (ref 80.0–100.0)
Platelets: 209 10*3/uL (ref 150–400)
RBC: 2.58 MIL/uL — ABNORMAL LOW (ref 3.87–5.11)
RDW: 14.8 % (ref 11.5–15.5)
WBC: 6.2 10*3/uL (ref 4.0–10.5)
nRBC: 0 % (ref 0.0–0.2)

## 2023-08-17 LAB — GLUCOSE, CAPILLARY
Glucose-Capillary: 85 mg/dL (ref 70–99)
Glucose-Capillary: 96 mg/dL (ref 70–99)
Glucose-Capillary: 125 mg/dL — ABNORMAL HIGH (ref 70–99)
Glucose-Capillary: 123 mg/dL — ABNORMAL HIGH (ref 70–99)

## 2023-08-17 MED ORDER — METOPROLOL TARTRATE 50 MG PO TABS
50.0000 mg | ORAL_TABLET | Freq: Two times a day (BID) | ORAL | Status: DC
  Administered 2023-08-17 – 2023-08-19 (×3): 50 mg via ORAL
  Filled 2023-08-17 (×4): qty 1

## 2023-08-17 NOTE — Plan of Care (Signed)
  Problem: Education: Goal: Knowledge of General Education information will improve Description: Including pain rating scale, medication(s)/side effects and non-pharmacologic comfort measures Outcome: Progressing   Problem: Health Behavior/Discharge Planning: Goal: Ability to manage health-related needs will improve Outcome: Progressing   Problem: Clinical Measurements: Goal: Ability to maintain clinical measurements within normal limits will improve Outcome: Progressing Goal: Will remain free from infection Outcome: Progressing Goal: Diagnostic test results will improve Outcome: Progressing Goal: Respiratory complications will improve Outcome: Progressing   Problem: Activity: Goal: Risk for activity intolerance will decrease Outcome: Progressing   Problem: Nutrition: Goal: Adequate nutrition will be maintained Outcome: Progressing   Problem: Elimination: Goal: Will not experience complications related to bowel motility Outcome: Progressing   Problem: Pain Management: Goal: General experience of comfort will improve Outcome: Progressing   Problem: Safety: Goal: Ability to remain free from injury will improve Outcome: Progressing   Problem: Coping: Goal: Ability to adjust to condition or change in health will improve Outcome: Progressing

## 2023-08-17 NOTE — Plan of Care (Signed)
  Problem: Health Behavior/Discharge Planning: Goal: Ability to manage health-related needs will improve Outcome: Progressing   Problem: Clinical Measurements: Goal: Ability to maintain clinical measurements within normal limits will improve Outcome: Progressing   Problem: Nutrition: Goal: Adequate nutrition will be maintained Outcome: Progressing   Problem: Pain Management: Goal: General experience of comfort will improve Outcome: Progressing   Problem: Fluid Volume: Goal: Ability to maintain a balanced intake and output will improve Outcome: Progressing   Problem: Metabolic: Goal: Ability to maintain appropriate glucose levels will improve Outcome: Progressing   Problem: Nutritional: Goal: Maintenance of adequate nutrition will improve Outcome: Progressing

## 2023-08-17 NOTE — Progress Notes (Signed)
[facility]                                                                               Tina Briggs, is a 77 y.o. female, DOB - Sep 22, 1946, MWN:[id number] Admit date - 08/06/2023    Outpatient Primary MD for the patient is [name], MD  LOS - 11  days    Brief summary   77 year old lady with prior history of hypertension hyperlipidemia, diabetes mellitus, IBS, anxiety and depression presents to ED for nausea diarrhea and confusion for 3 days was found to be hypotensive on admission.  CT abdomen pelvis suggestive of sigmoid diverticulitis started on IV Zosyn and IV fluids.  She was initially admitted to [facility] service for sepsis and hypovolemic shock and transferred to [facility] on 08/13/2023.   Assessment & Plan    Assessment and Plan:  Sepsis/hypovolemic shock secondary to E. coli bacteremia from ascending cholangitis Acute uncomplicated sigmoid diverticulitis with severe diarrhea C. difficile PCR is negative Currently on IV antibiotics until 11/18, will transition to p.o. after that She was started on diet and advance as tolerated.  Appetite has been improving   Intrahepatic biliary duct dilatation/choledocholithiasis/cholangitis MRCP showing choledocholithiasis with possible biliary stricture Underwent ERCP with sphincterotomy with stone extraction as well as temporary plastic biliary stent placement on 11/11. Patient will likely need repeat ERCP  with spyglass for further evaluation of possible remaining CBD stones and evaluation of biliary stricture to be scheduled as outpatient. Liver enzymes improving. Bilirubin at 1.8 and alk phos stabilized at 232.  Stable on repeat today. -Status post laparoscopic cholecystectomy in the past  Diarrhea -Patient with chronic diarrhea at home for which she is taking cholestyramine, she is complaining of diarrhea during hospital stay, which has resolved after resuming her home dose cholestyramine.  Acute kidney  injury on stage IIIb CKD Patient's creatinine between 2.5-3.5 Continue to improve.  It is 2.39 today.  Hyponatremia -Improving  Severe hypoalbuminemia:  PCM Significantly low albumin, will consult nutritionist Started on supplements.   New onset atrial fibrillation with RVR Appears to be single episode, no recurrence in the setting of septic shock with no recurrence, will hold anticoagulation at this point as risks outweighed benefits   Type 2 diabetes mellitus A1c of 5.9 Holding patient's semaglutide and farxiga at this time.   History of anxiety and depression  continue with Effexor  Right upper extremity swelling:  From IV infiltration. Venous duplex negative for SVT or DVT.  Elevate the arm and monitor.  Apply warm compress  Hypertension -Pressure initially low-soft, so home meds including losartan and metoprolol been on hold . -Blood pressure started to increase, so I will increase her metoprolol from 25 mg to 50 mg p.o. twice daily blood pressure started to increase,  (home dose is 100 mg p.o. twice daily)   Estimated body mass index is 33.59 kg/m as calculated from the following:   Height as of this encounter: 5\' 3"  (1.6 m).   Weight as of this encounter: 86 kg.  Code Status: Full code DVT Prophylaxis:  heparin injection 5,000 Units Start: 08/12/23 1400 SCDs Start: 08/06/23 2011   Level of Care: Level of care: Progressive  Family Communication: None at best side  Disposition Plan:     Remains inpatient appropriate: IV antibiotics for ascending cholangitis Procedures:  ERCP with sphincterotomy with stone extraction and temporary plastic biliary stent placement on 11/11 MRI of the abdomen  Consultants:   Gastroenterology.  PCCM.   Antimicrobials:   Anti-infectives (From admission, onward)    Start     Dose/Rate Route Frequency Ordered Stop   08/08/23 1400  cefTRIAXone (ROCEPHIN) 2 g in sodium chloride 0.9 % 100 mL IVPB        2 g 200 mL/hr over 30  Minutes Intravenous Every 24 hours 08/08/23 1059 08/17/23 2359   08/06/23 2200  piperacillin-tazobactam (ZOSYN) IVPB 2.25 g  Status:  Discontinued        2.25 g 100 mL/hr over 30 Minutes Intravenous Every 8 hours 08/06/23 2015 08/08/23 1059   08/06/23 1515  clindamycin (CLEOCIN) IVPB 300 mg  Status:  Discontinued        300 mg 100 mL/hr over 30 Minutes Intravenous  Once 08/06/23 1511 08/06/23 1512   08/06/23 1515  piperacillin-tazobactam (ZOSYN) IVPB 3.375 g        3.375 g 100 mL/hr over 30 Minutes Intravenous  Once 08/06/23 1512 08/06/23 1613        Medications  Scheduled Meds:  Chlorhexidine Gluconate Cloth  6 each Topical Daily   colestipol  2 g Oral BID   feeding supplement  237 mL Oral BID BM   gabapentin  100 mg Oral BID   heparin injection (subcutaneous)  5,000 Units Subcutaneous Q8H   insulin aspart  0-5 Units Subcutaneous QHS   insulin aspart  0-9 Units Subcutaneous TID WC   metoprolol tartrate  25 mg Oral BID   mouth rinse  15 mL Mouth Rinse 4 times per day   rosuvastatin  40 mg Oral QHS   venlafaxine XR  75 mg Oral Q breakfast   Continuous Infusions:  cefTRIAXone (ROCEPHIN)  IV 2 g (08/16/23 1228)   PRN Meds:.acetaminophen, ondansetron, mouth rinse, mouth rinse    Subjective:   Tina Briggs r she denies any further diarrhea after starting cholestyramine, reports she is feeling better, no complaints  Objective:   Vitals:   08/17/23 0033 08/17/23 0400 08/17/23 0718 08/17/23 0800  BP: (!) 122/49 (!) 128/53    Pulse: 60 (!) 58    Resp: 11 11    Temp: 97.9 F (36.6 C) 98 F (36.7 C)  97.7 F (36.5 C)  TempSrc: Oral Oral  Oral  SpO2: 93% 94%    Weight:   86 kg   Height:       No intake or output data in the 24 hours ending 08/17/23 1251  Filed Weights   08/15/23 0400 08/16/23 0500 08/17/23 0718  Weight: 89.7 kg 86.3 kg 86 kg     Exam Awake Alert, Oriented X 3, No new F.N deficits, Normal affect Symmetrical Chest wall movement, Good air  movement bilaterally, CTAB RRR,No Gallops,Rubs or new Murmurs, No Parasternal Heave +ve B.Sounds, Abd Soft, No tenderness, No rebound - guarding or rigidity. No Cyanosis, Clubbing or edema, No new Rash or bruise      Data Reviewed:  I have personally reviewed following labs and imaging studies   CBC Lab Results  Component Value Date   WBC 6.2 08/17/2023   RBC 2.58 (L) 08/17/2023   HGB 7.5 (L) 08/17/2023   HCT 23.5 (L) 08/17/2023   MCV 91.1 08/17/2023   MCH 29.1 08/17/2023   PLT  209 08/17/2023   MCHC 31.9 08/17/2023   RDW 14.8 08/17/2023   LYMPHSABS 0.5 (L) 08/12/2023   MONOABS 0.6 08/12/2023   EOSABS 0.0 08/12/2023   BASOSABS 0.0 08/12/2023     Last metabolic panel Lab Results  Component Value Date   NA 138 08/17/2023   K 3.8 08/17/2023   CL 109 08/17/2023   CO2 22 08/17/2023   BUN 32 (H) 08/17/2023   CREATININE 2.03 (H) 08/17/2023   GLUCOSE 101 (H) 08/17/2023   GFRNONAA 25 (L) 08/17/2023   GFRAA >60 10/19/2018   CALCIUM 7.6 (L) 08/17/2023   PHOS 4.3 08/15/2023   PROT 5.0 (L) 08/15/2023   ALBUMIN <1.5 (L) 08/15/2023   LABGLOB 3.1 08/26/2017   BILITOT 0.8 08/15/2023   ALKPHOS 231 (H) 08/15/2023   AST 21 08/15/2023   ALT 26 08/15/2023   ANIONGAP 7 08/17/2023    CBG (last 3)  Recent Labs    08/16/23 2131 08/17/23 0837 08/17/23 1231  GLUCAP 102* 85 96      Coagulation Profile: No results for input(s): "INR", "PROTIME" in the last 168 hours.    Radiology Studies: No results found.     Huey Bienenstock M.D. [facility] 08/17/2023, 12:51 PM  Available via Epic secure chat 7am-7pm After 7 pm, please refer to night coverage provider listed on amion.

## 2023-08-18 DIAGNOSIS — N179 Acute kidney failure, unspecified: Secondary | ICD-10-CM | POA: Diagnosis not present

## 2023-08-18 DIAGNOSIS — A419 Sepsis, unspecified organism: Secondary | ICD-10-CM | POA: Diagnosis not present

## 2023-08-18 DIAGNOSIS — R6521 Severe sepsis with septic shock: Secondary | ICD-10-CM | POA: Diagnosis not present

## 2023-08-18 DIAGNOSIS — G9341 Metabolic encephalopathy: Secondary | ICD-10-CM | POA: Diagnosis not present

## 2023-08-18 LAB — CBC
HCT: 24.6 % — ABNORMAL LOW (ref 36.0–46.0)
Hemoglobin: 7.9 g/dL — ABNORMAL LOW (ref 12.0–15.0)
MCH: 29.6 pg (ref 26.0–34.0)
MCHC: 32.1 g/dL (ref 30.0–36.0)
MCV: 92.1 fL (ref 80.0–100.0)
Platelets: 199 10*3/uL (ref 150–400)
RBC: 2.67 MIL/uL — ABNORMAL LOW (ref 3.87–5.11)
RDW: 14.4 % (ref 11.5–15.5)
WBC: 5.9 10*3/uL (ref 4.0–10.5)
nRBC: 0 % (ref 0.0–0.2)

## 2023-08-18 LAB — GLUCOSE, CAPILLARY
Glucose-Capillary: 91 mg/dL (ref 70–99)
Glucose-Capillary: 123 mg/dL — ABNORMAL HIGH (ref 70–99)
Glucose-Capillary: 150 mg/dL — ABNORMAL HIGH (ref 70–99)
Glucose-Capillary: 149 mg/dL — ABNORMAL HIGH (ref 70–99)

## 2023-08-18 MED ORDER — LOPERAMIDE HCL 2 MG PO CAPS
2.0000 mg | ORAL_CAPSULE | Freq: Four times a day (QID) | ORAL | Status: DC | PRN
  Administered 2023-08-18 (×2): 2 mg via ORAL
  Filled 2023-08-18 (×2): qty 1

## 2023-08-18 MED ORDER — SODIUM CHLORIDE 0.9 % IV SOLN
2.0000 g | INTRAVENOUS | Status: AC
  Administered 2023-08-18: 2 g via INTRAVENOUS
  Filled 2023-08-18: qty 20

## 2023-08-18 NOTE — Progress Notes (Signed)
Physical Therapy Treatment Patient Details Name: Tina Briggs MRN: [id number] DOB: Jul 20, 1946 Today's Date: 08/18/2023   History of Present Illness 77 y/o woman presented 11/6 with abdominal pain, nausea, and confusion. She was found to have septic shock and bacteremia. ERCP 11/11. PMHx: HTN, HLD, DM, IBS, anxiety, depression.    PT Comments  Pt tolerated treatment well today. Handoff from OT. Pt today able to progress ambulation distance in hallway with RW CGA however continues to be limited by decreased activity tolerance. No change in DC/DME recs at this time. PT will continue to follow.    If plan is discharge home, recommend the following: A little help with walking and/or transfers;A little help with bathing/dressing/bathroom;Assistance with cooking/housework;Assist for transportation   Can travel by private vehicle        Equipment Recommendations  Other (comment) (per accepting facility)    Recommendations for Other Services       Precautions / Restrictions Precautions Precautions: Fall Precaution Comments: monitor BP Restrictions Weight Bearing Restrictions: No     Mobility  Bed Mobility               General bed mobility comments: Pt in chair with OT    Transfers Overall transfer level: Needs assistance Equipment used: Rolling walker (2 wheels) Transfers: Sit to/from Stand Sit to Stand: Contact guard assist           General transfer comment: Cues for hand placement    Ambulation/Gait Ambulation/Gait assistance: Contact guard assist, +2 safety/equipment Gait Distance (Feet): 90 Feet Assistive device: Rolling walker (2 wheels) Gait Pattern/deviations: Decreased stride length, Step-through pattern Gait velocity: slow     General Gait Details: 1 minor LOB noted however pt was able to self correct. Pt very fatigued upon arrival back to room.   Stairs             Wheelchair Mobility     Tilt Bed    Modified Rankin (Stroke  Patients Only)       Balance Overall balance assessment: Needs assistance Sitting-balance support: No upper extremity supported, Feet supported Sitting balance-Leahy Scale: Fair     Standing balance support: Bilateral upper extremity supported Standing balance-Leahy Scale: Poor Standing balance comment: Reliant on RW                            Cognition Arousal: Alert Behavior During Therapy: WFL for tasks assessed/performed Overall Cognitive Status: Within Functional Limits for tasks assessed                                          Exercises      General Comments General comments (skin integrity, edema, etc.): VSS      Pertinent Vitals/Pain Pain Assessment Pain Assessment: No/denies pain    Home Living                          Prior Function            PT Goals (current goals can now be found in the care plan section) Progress towards PT goals: Progressing toward goals    Frequency    Min 1X/week      PT Plan      Co-evaluation              AM-PAC PT "6 Clicks"  Mobility   Outcome Measure  Help needed turning from your back to your side while in a flat bed without using bedrails?: A Little Help needed moving from lying on your back to sitting on the side of a flat bed without using bedrails?: A Little Help needed moving to and from a bed to a chair (including a wheelchair)?: A Little Help needed standing up from a chair using your arms (e.g., wheelchair or bedside chair)?: A Little Help needed to walk in hospital room?: A Little Help needed climbing 3-5 steps with a railing? : A Lot 6 Click Score: 17    End of Session Equipment Utilized During Treatment: Gait belt Activity Tolerance: Patient tolerated treatment well Patient left: in chair;with call bell/phone within reach Nurse Communication: Mobility status PT Visit Diagnosis: Unsteadiness on feet (R26.81);Muscle weakness (generalized)  (M62.81);Difficulty in walking, not elsewhere classified (R26.2)     Time: 8657-8469 PT Time Calculation (min) (ACUTE ONLY): 8 min  Charges:    $Gait Training: 8-22 mins PT General Charges $$ ACUTE PT VISIT: 1 Visit                     Shela Nevin, PT, DPT Acute Rehab Services [phone]    Tina Briggs 08/18/2023, 12:07 PM

## 2023-08-18 NOTE — Progress Notes (Signed)
[facility]                                                                               Tina Briggs, is a 77 y.o. female, DOB - 08-24-1946, WUJ:[id number] Admit date - 08/06/2023    Outpatient Primary MD for the patient is Tina Housekeeper, MD  LOS - 12  days    Brief summary   77 year old lady with prior history of hypertension hyperlipidemia, diabetes mellitus, IBS, anxiety and depression presents to ED for nausea diarrhea and confusion for 3 days was found to be hypotensive on admission.  CT abdomen pelvis suggestive of sigmoid diverticulitis started on IV Zosyn and IV fluids.  She was initially admitted to [facility] service for sepsis and hypovolemic shock and transferred to [facility] on 08/13/2023.   Assessment & Plan    Assessment and Plan:  Sepsis/hypovolemic shock secondary to E. coli bacteremia from ascending cholangitis Acute uncomplicated sigmoid diverticulitis with severe diarrhea C. difficile PCR is negative Currently on IV antibiotics antibiotics, initial IV Zosyn, then transition to Rocephin, last dose is today for total of 14 days treatment.  She was started on diet and advance as tolerated.  Appetite has been improving, good p.o. intake.   Intrahepatic biliary duct dilatation/choledocholithiasis/cholangitis MRCP showing choledocholithiasis with possible biliary stricture Underwent ERCP with sphincterotomy with stone extraction as well as temporary plastic biliary stent placement on 11/11. Patient will likely need repeat ERCP  with spyglass for further evaluation of possible remaining CBD stones and evaluation of biliary stricture to be scheduled as outpatient. Liver enzymes improving. Bilirubin at 1.8 and alk phos stabilized at 232.  Stable on repeat today. -Status post laparoscopic cholecystectomy in the past  Diarrhea -Patient with chronic diarrhea at home for which she is taking cholestyramine, she is complaining of diarrhea during hospital stay,  area much improved after resuming her home dose cholestyramine.  Acute kidney injury on stage IIIb CKD Patient's creatinine between 2.5-3.5 Continue to improve.  Function back at baseline.  Hyponatremia -Improving  Severe hypoalbuminemia:  PCM Significantly low albumin Started on supplements.  New onset atrial fibrillation with RVR Appears to be single episode, no recurrence in the setting of septic shock with no recurrence, will hold anticoagulation at this point as risks outweighed benefits  Type 2 diabetes mellitus A1c of 5.9 Holding patient's semaglutide and farxiga at this time. Continue with insulin sliding scale  History of anxiety and depression  continue with Effexor  Right upper extremity swelling:  From IV infiltration. Venous duplex negative for SVT or DVT.  Elevate the arm and monitor.  Apply warm compress  Hypertension -Pressure initially low-soft, so home meds including losartan and metoprolol been on hold . -Blood pressure started to increase, so I will increase her metoprolol from 25 mg to 50 mg p.o. twice daily blood pressure started to increase,  (home dose is 100 mg p.o. twice daily)   Estimated body mass index is 33.19 kg/m as calculated from the following:   Height as of this encounter: 5\' 3"  (1.6 m).   Weight as of this encounter: 85 kg.  Code Status: Full code DVT Prophylaxis:  heparin injection 5,000 Units Start: 08/12/23 1400  SCDs Start: 08/06/23 2011   Level of Care: Level of care: Progressive Family Communication: Cussed with husband at bedside 11/17  Disposition Plan:    Stable for discharge to CIR when bed is available. Procedures:  ERCP with sphincterotomy with stone extraction and temporary plastic biliary stent placement on 11/11 MRI of the abdomen  Consultants:   Gastroenterology.  PCCM.   Antimicrobials:   Anti-infectives (From admission, onward)    Start     Dose/Rate Route Frequency Ordered Stop   08/08/23 1400   cefTRIAXone (ROCEPHIN) 2 g in sodium chloride 0.9 % 100 mL IVPB        2 g 200 mL/hr over 30 Minutes Intravenous Every 24 hours 08/08/23 1059 08/17/23 1342   08/06/23 2200  piperacillin-tazobactam (ZOSYN) IVPB 2.25 g  Status:  Discontinued        2.25 g 100 mL/hr over 30 Minutes Intravenous Every 8 hours 08/06/23 2015 08/08/23 1059   08/06/23 1515  clindamycin (CLEOCIN) IVPB 300 mg  Status:  Discontinued        300 mg 100 mL/hr over 30 Minutes Intravenous  Once 08/06/23 1511 08/06/23 1512   08/06/23 1515  piperacillin-tazobactam (ZOSYN) IVPB 3.375 g        3.375 g 100 mL/hr over 30 Minutes Intravenous  Once 08/06/23 1512 08/06/23 1613        Medications  Scheduled Meds:  Chlorhexidine Gluconate Cloth  6 each Topical Daily   colestipol  2 g Oral BID   feeding supplement  237 mL Oral BID BM   gabapentin  100 mg Oral BID   heparin injection (subcutaneous)  5,000 Units Subcutaneous Q8H   insulin aspart  0-5 Units Subcutaneous QHS   insulin aspart  0-9 Units Subcutaneous TID WC   metoprolol tartrate  50 mg Oral BID   mouth rinse  15 mL Mouth Rinse 4 times per day   rosuvastatin  40 mg Oral QHS   venlafaxine XR  75 mg Oral Q breakfast   Continuous Infusions:   PRN Meds:.acetaminophen, loperamide, ondansetron, mouth rinse, mouth rinse    Subjective:   Tina Briggs r she denies any further diarrhea after starting cholestyramine, reports she is feeling better, no complaints  Objective:   Vitals:   08/17/23 2129 08/17/23 2318 08/18/23 0355 08/18/23 0500  BP: (!) 162/62 (!) 157/59 (!) 149/57   Pulse:  61 63   Resp:  18 16   Temp:  98.5 F (36.9 C) 98.9 F (37.2 C)   TempSrc:  Oral Oral   SpO2:  95% 94%   Weight:    85 kg  Height:       No intake or output data in the 24 hours ending 08/18/23 1021  Filed Weights   08/16/23 0500 08/17/23 0718 08/18/23 0500  Weight: 86.3 kg 86 kg 85 kg     Exam Awake Alert, Oriented X 3, No new F.N deficits, Normal  affect Symmetrical Chest wall movement, Good air movement bilaterally, CTAB RRR,No Gallops,Rubs or new Murmurs, No Parasternal Heave +ve B.Sounds, Abd Soft, No tenderness, No rebound - guarding or rigidity. No Cyanosis, Clubbing or edema, No new Rash or bruise      Data Reviewed:  I have personally reviewed following labs and imaging studies   CBC Lab Results  Component Value Date   WBC 5.9 08/18/2023   RBC 2.67 (L) 08/18/2023   HGB 7.9 (L) 08/18/2023   HCT 24.6 (L) 08/18/2023   MCV 92.1 08/18/2023   MCH 29.6  08/18/2023   PLT 199 08/18/2023   MCHC 32.1 08/18/2023   RDW 14.4 08/18/2023   LYMPHSABS 0.5 (L) 08/12/2023   MONOABS 0.6 08/12/2023   EOSABS 0.0 08/12/2023   BASOSABS 0.0 08/12/2023     Last metabolic panel Lab Results  Component Value Date   NA 138 08/17/2023   K 3.8 08/17/2023   CL 109 08/17/2023   CO2 22 08/17/2023   BUN 32 (H) 08/17/2023   CREATININE 2.03 (H) 08/17/2023   GLUCOSE 101 (H) 08/17/2023   GFRNONAA 25 (L) 08/17/2023   GFRAA >60 10/19/2018   CALCIUM 7.6 (L) 08/17/2023   PHOS 4.3 08/15/2023   PROT 5.0 (L) 08/15/2023   ALBUMIN <1.5 (L) 08/15/2023   LABGLOB 3.1 08/26/2017   BILITOT 0.8 08/15/2023   ALKPHOS 231 (H) 08/15/2023   AST 21 08/15/2023   ALT 26 08/15/2023   ANIONGAP 7 08/17/2023    CBG (last 3)  Recent Labs    08/17/23 1609 08/17/23 2122 08/18/23 0808  GLUCAP 125* 123* 91      Coagulation Profile: No results for input(s): "INR", "PROTIME" in the last 168 hours.    Radiology Studies: No results found.     Huey Bienenstock M.D. [facility] 08/18/2023, 10:21 AM  Available via Epic secure chat 7am-7pm After 7 pm, please refer to night coverage provider listed on amion.

## 2023-08-18 NOTE — Progress Notes (Signed)
Occupational Therapy Treatment Patient Details Name: Tina Briggs MRN: [id number] DOB: [birth year] Today's Date: 08/18/2023   History of present illness 77 y/o woman presented 11/6 with abdominal pain, nausea, and confusion. She was found to have septic shock and bacteremia. ERCP 11/11. PMHx: HTN, HLD, DM, IBS, anxiety, depression.   OT comments  Pt making excellent progress towards OT goals. Session focused on increasing activity tolerance with ADLs standing at sink, bathroom mobility using RW and subsequent PT session following OT session. Emphasis on alternating UE support in standing, UE HEP completion and flexibility for LB ADLs. Pt/spouse report interest in intensive rehab prior to return home as pt is below normally completely independent and active baseline.       If plan is discharge home, recommend the following:  A little help with walking and/or transfers;A little help with bathing/dressing/bathroom;Assistance with cooking/housework   Equipment Recommendations  BSC/3in1    Recommendations for Other Services Rehab consult    Precautions / Restrictions Precautions Precautions: Fall Precaution Comments: monitor BP Restrictions Weight Bearing Restrictions: No       Mobility Bed Mobility               General bed mobility comments: in chair on entry    Transfers Overall transfer level: Needs assistance Equipment used: Rolling walker (2 wheels) Transfers: Sit to/from Stand Sit to Stand: Contact guard assist                 Balance Overall balance assessment: Needs assistance Sitting-balance support: No upper extremity supported, Feet supported Sitting balance-Leahy Scale: Fair     Standing balance support: Bilateral upper extremity supported Standing balance-Leahy Scale: Poor Standing balance comment: reliant on at least one UE support in standing                           ADL either performed or assessed with clinical judgement    ADL Overall ADL's : Needs assistance/impaired     Grooming: Standing;Supervision/safety;Brushing hair Grooming Details (indicate cue type and reason): alternating one UE support for task standing at sink             Lower Body Dressing: Minimal assistance;Sit to/from stand;Sitting/lateral leans Lower Body Dressing Details (indicate cue type and reason): increased effort but able to cross BLE to reach socks. anticipate some assist needed to fully doff/don socks Toilet Transfer: Contact guard assist;Ambulation;Rolling walker (2 wheels) Toilet Transfer Details (indicate cue type and reason): increased effort to stand from lower toilet but able to manage. Placed BSC over toilet and encouraged pt to ambulate to/from bathroom with staff assist during the day, ok to use BSC at night if needed Toileting- Clothing Manipulation and Hygiene: Contact guard assist;Sitting/lateral lean;Sit to/from stand Toileting - Clothing Manipulation Details (indicate cue type and reason): light assist with back gown mgmt but able to pull down depends alternating UE support on grab bar vs RW     Functional mobility during ADLs: Contact guard assist;Rolling walker (2 wheels) General ADL Comments: Provided energy conservation handout    Extremity/Trunk Assessment Upper Extremity Assessment Upper Extremity Assessment: Right hand dominant;RUE deficits/detail RUE Deficits / Details: Swelling and ROM much improved though some edema persistent around elbow. overall able to use this UE much more effectively and reports able to complete UE HEP earlier RUE Coordination: decreased fine motor   Lower Extremity Assessment Lower Extremity Assessment: Defer to PT evaluation        Vision   Vision  Assessment?: No apparent visual deficits   Perception     Praxis      Cognition Arousal: Alert Behavior During Therapy: WFL for tasks assessed/performed Overall Cognitive Status: Within Functional Limits for tasks  assessed                                          Exercises      Shoulder Instructions       General Comments VSS on RA. Husband present initially but stepped out for lunch    Pertinent Vitals/ Pain       Pain Assessment Pain Assessment: No/denies pain  Home Living                                          Prior Functioning/Environment              Frequency  Min 1X/week        Progress Toward Goals  OT Goals(current goals can now be found in the care plan section)  Progress towards OT goals: Progressing toward goals  Acute Rehab OT Goals Patient Stated Goal: get better and be able to go home OT Goal Formulation: With patient Time For Goal Achievement: 08/23/23 Potential to Achieve Goals: Good ADL Goals Pt Will Perform Lower Body Dressing: with modified independence;sitting/lateral leans;sit to/from stand Pt Will Transfer to Toilet: with modified independence;ambulating Pt/caregiver will Perform Home Exercise Program: Increased strength;Both right and left upper extremity;Independently;With theraband;With written HEP provided Additional ADL Goal #1: Pt to increase standing tolerance > 10 min without rest break during ADLs/functional mobility  Plan      Co-evaluation                 AM-PAC OT "6 Clicks" Daily Activity     Outcome Measure   Help from another person eating meals?: None Help from another person taking care of personal grooming?: A Little Help from another person toileting, which includes using toliet, bedpan, or urinal?: A Little Help from another person bathing (including washing, rinsing, drying)?: A Lot Help from another person to put on and taking off regular upper body clothing?: A Little Help from another person to put on and taking off regular lower body clothing?: A Lot 6 Click Score: 17    End of Session Equipment Utilized During Treatment: Gait belt;Rolling walker (2 wheels)  OT Visit  Diagnosis: Unsteadiness on feet (R26.81);Other abnormalities of gait and mobility (R26.89);Muscle weakness (generalized) (M62.81)   Activity Tolerance Patient tolerated treatment well   Patient Left  (to ambulate with PT)   Nurse Communication Mobility status        Time: 1914-7829 OT Time Calculation (min): 25 min  Charges: OT General Charges $OT Visit: 1 Visit OT Treatments $Self Care/Home Management : 8-22 mins  Bradd Canary, OTR/L Acute Rehab Services Office: [phone]   Lorre Munroe 08/18/2023, 12:41 PM

## 2023-08-18 NOTE — Progress Notes (Signed)
Inpatient Rehab Admissions Coordinator:    CIR following for progress and participation with therapies, though if she continues working with therapies, she may be able to go home and do HH. I will see how she does with therapy today.   Megan Salon, MS, CCC-SLP Rehab Admissions Coordinator  [phone] (celll) [phone] (office)

## 2023-08-19 ENCOUNTER — Other Ambulatory Visit (HOSPITAL_COMMUNITY): Payer: Self-pay

## 2023-08-19 DIAGNOSIS — N179 Acute kidney failure, unspecified: Secondary | ICD-10-CM | POA: Diagnosis not present

## 2023-08-19 DIAGNOSIS — R6521 Severe sepsis with septic shock: Secondary | ICD-10-CM | POA: Diagnosis not present

## 2023-08-19 DIAGNOSIS — A419 Sepsis, unspecified organism: Secondary | ICD-10-CM | POA: Diagnosis not present

## 2023-08-19 DIAGNOSIS — G9341 Metabolic encephalopathy: Secondary | ICD-10-CM | POA: Diagnosis not present

## 2023-08-19 LAB — GLUCOSE, CAPILLARY: Glucose-Capillary: 81 mg/dL (ref 70–99)

## 2023-08-19 MED ORDER — GABAPENTIN 100 MG PO CAPS
100.0000 mg | ORAL_CAPSULE | Freq: Two times a day (BID) | ORAL | 0 refills | Status: DC
  Filled 2023-08-19: qty 60, 30d supply, fill #0

## 2023-08-19 MED ORDER — METOPROLOL TARTRATE 50 MG PO TABS
50.0000 mg | ORAL_TABLET | Freq: Two times a day (BID) | ORAL | 0 refills | Status: DC
  Filled 2023-08-19: qty 60, 30d supply, fill #0

## 2023-08-19 MED ORDER — AMLODIPINE BESYLATE 5 MG PO TABS
5.0000 mg | ORAL_TABLET | Freq: Every day | ORAL | 0 refills | Status: DC
  Filled 2023-08-19: qty 30, 30d supply, fill #0

## 2023-08-19 MED ORDER — ACETAMINOPHEN 325 MG PO TABS: 650.0000 mg | ORAL_TABLET | Freq: Four times a day (QID) | ORAL | Status: AC | PRN

## 2023-08-19 NOTE — Progress Notes (Signed)
Physical Therapy Treatment Patient Details Name: Tina Briggs MRN: [id number] DOB: [birth year] Today's Date: 08/19/2023   History of Present Illness 77 y/o woman presented 11/6 with abdominal pain, nausea, and confusion. She was found to have septic shock and bacteremia. ERCP 11/11. PMHx: HTN, HLD, DM, IBS, anxiety, depression.    PT Comments  Pt tolerated treatment well today. Pt continues to demonstrate improvement with gait and activity tolerance. DC recs updated to HHPT. PT will continue to follow. Pt anticipates DC home today.   If plan is discharge home, recommend the following: A little help with walking and/or transfers;A little help with bathing/dressing/bathroom;Assistance with cooking/housework;Assist for transportation   Can travel by private vehicle        Equipment Recommendations  None recommended by PT    Recommendations for Other Services       Precautions / Restrictions Precautions Precautions: Fall Precaution Comments: monitor BP Restrictions Weight Bearing Restrictions: No     Mobility  Bed Mobility Overal bed mobility: Needs Assistance Bed Mobility: Supine to Sit     Supine to sit: Supervision     General bed mobility comments: Left seated EOB    Transfers Overall transfer level: Needs assistance Equipment used: Rolling walker (2 wheels) Transfers: Sit to/from Stand Sit to Stand: Supervision           General transfer comment: Cues for hand placement    Ambulation/Gait Ambulation/Gait assistance: Supervision Gait Distance (Feet): 100 Feet Assistive device: Rolling walker (2 wheels) Gait Pattern/deviations: Decreased stride length, Step-through pattern Gait velocity: decreased but improved     General Gait Details: Pt overall steady. no LOB noted.   Stairs             Wheelchair Mobility     Tilt Bed    Modified Rankin (Stroke Patients Only)       Balance Overall balance assessment: Needs  assistance Sitting-balance support: No upper extremity supported, Feet supported Sitting balance-Leahy Scale: Fair     Standing balance support: Bilateral upper extremity supported Standing balance-Leahy Scale: Poor Standing balance comment: reliant on at least one UE support in standing                            Cognition Arousal: Alert Behavior During Therapy: WFL for tasks assessed/performed Overall Cognitive Status: Within Functional Limits for tasks assessed                                          Exercises      General Comments General comments (skin integrity, edema, etc.): VSS      Pertinent Vitals/Pain Pain Assessment Pain Assessment: No/denies pain    Home Living                          Prior Function            PT Goals (current goals can now be found in the care plan section) Progress towards PT goals: Progressing toward goals    Frequency    Min 1X/week      PT Plan      Co-evaluation              AM-PAC PT "6 Clicks" Mobility   Outcome Measure  Help needed turning from your back to your side while in a flat  bed without using bedrails?: A Little Help needed moving from lying on your back to sitting on the side of a flat bed without using bedrails?: A Little Help needed moving to and from a bed to a chair (including a wheelchair)?: A Little Help needed standing up from a chair using your arms (e.g., wheelchair or bedside chair)?: A Little Help needed to walk in hospital room?: A Little Help needed climbing 3-5 steps with a railing? : A Lot 6 Click Score: 17    End of Session Equipment Utilized During Treatment: Gait belt Activity Tolerance: Patient tolerated treatment well Patient left: in chair;with call bell/phone within reach Nurse Communication: Mobility status PT Visit Diagnosis: Unsteadiness on feet (R26.81);Muscle weakness (generalized) (M62.81);Difficulty in walking, not elsewhere  classified (R26.2)     Time: 4010-2725 PT Time Calculation (min) (ACUTE ONLY): 13 min  Charges:    $Gait Training: 8-22 mins PT General Charges $$ ACUTE PT VISIT: 1 Visit                     Shela Nevin, PT, DPT Acute Rehab Services [phone]    [name] 08/19/2023, 11:41 AM

## 2023-08-19 NOTE — Progress Notes (Signed)
Inpatient Rehabilitation Admissions Coordinator   I met at bedside with patient and spouse as well as spoke with her son by phone. Patient at a level not to need CIR level rehab at this time. They wish to pursue HH at this time. Acute team and TOC made aware. We will sign off.  Ottie Glazier, RN, MSN Rehab Admissions Coordinator [phone] 08/19/2023 11:02 AM

## 2023-08-19 NOTE — TOC Transition Note (Signed)
Transition of Care [facility]) - CM/SW Discharge Note   Patient Details  Name: Tina Briggs MRN: [id number] Date of Birth: 11/22/1945  Transition of Care [facility]) CM/SW Contact:  Gordy Clement, RN Phone Number: 08/19/2023, 11:24 AM   Clinical Narrative:     Patient will DC to home today. Home Health SN/PT OT and AIDE Will be provided by [facility] A BSC And wheelchair will be provided by [employer] and delivered bedside  prior to DC.   Patient states she can get in and out of the car and Husband will transport home   AVS updated       Barriers to Discharge: Continued Medical Work up   Patient Goals and CMS Choice CMS Medicare.gov Compare Post Acute Care list provided to:: Patient Choice offered to / list presented to : Patient, Spouse  Discharge Placement                         Discharge Plan and Services Additional resources added to the After Visit Summary for   In-house Referral: Clinical Social Work Discharge Planning Services: (P) CM Consult Post Acute Care Choice: Home Health, Durable Medical Equipment                    HH Arranged: PT, OT [facility] Agency: [facility] Care Date [facility] Agency Contacted: 08/11/23 Time HH Agency Contacted: 1617 Representative spoke with at [facility] Agency: Kandee Keen  Social Determinants of Health (SDOH) Interventions SDOH Screenings   Food Insecurity: No Food Insecurity (08/12/2023)  Housing: Low Risk  (08/12/2023)  Transportation Needs: No Transportation Needs (08/12/2023)  Utilities: Not At Risk (08/12/2023)  Depression (PHQ2-9): Low Risk  (01/25/2021)  Tobacco Use: Low Risk  (08/11/2023)     Readmission Risk Interventions     No data to display

## 2023-08-19 NOTE — Progress Notes (Signed)
Mobility Specialist Progress Note;   08/19/23 0935  Mobility  Activity Ambulated with assistance in hallway  Level of Assistance Contact guard assist, steadying assist  Assistive Device Front wheel walker  Distance Ambulated (ft) 100 ft  Activity Response Tolerated well  Mobility Referral Yes  $Mobility charge 1 Mobility  Mobility Specialist Start Time (ACUTE ONLY) 0935  Mobility Specialist Stop Time (ACUTE ONLY) 0950  Mobility Specialist Time Calculation (min) (ACUTE ONLY) 15 min   Pt agreeable to mobility. Required minG assistance during ambulation for safety. C/o fatigue during session, otherwise asx. VSS throughout. Pt returned back to chair with all needs met.   Caesar Bookman Mobility Specialist Please contact via SecureChat or Rehab Office [phone]

## 2023-08-19 NOTE — Plan of Care (Signed)

## 2023-08-19 NOTE — Discharge Instructions (Signed)
Follow with Primary MD [name], MD in 7 days   Get CBC, CMP,  checked  by Primary MD next visit.    Activity: As tolerated with Full fall precautions use walker/cane & assistance as needed   Disposition Home    Diet: Heart Healthy    On your next visit with your primary care physician please Get Medicines reviewed and adjusted.   Please request your Prim.MD to go over all Hospital Tests and Procedure/Radiological results at the follow up, please get all Hospital records sent to your Prim MD by signing hospital release before you go home.   If you experience worsening of your admission symptoms, develop shortness of breath, life threatening emergency, suicidal or homicidal thoughts you must seek medical attention immediately by calling 911 or calling your MD immediately  if symptoms less severe.  You Must read complete instructions/literature along with all the possible adverse reactions/side effects for all the Medicines you take and that have been prescribed to you. Take any new Medicines after you have completely understood and accpet all the possible adverse reactions/side effects.   Do not drive, operating heavy machinery, perform activities at heights, swimming or participation in water activities or provide baby sitting services if your were admitted for syncope or siezures until you have seen by Primary MD or a Neurologist and advised to do so again.  Do not drive when taking Pain medications.    Do not take more than prescribed Pain, Sleep and Anxiety Medications  Special Instructions: If you have smoked or chewed Tobacco  in the last 2 yrs please stop smoking, stop any regular Alcohol  and or any Recreational drug use.  Wear Seat belts while driving.   Please note  You were cared for by a hospitalist during your hospital stay. If you have any questions about your discharge medications or the care you received while you were in the hospital after you are  discharged, you can call the unit and asked to speak with the hospitalist on call if the hospitalist that took care of you is not available. Once you are discharged, your primary care physician will handle any further medical issues. Please note that NO REFILLS for any discharge medications will be authorized once you are discharged, as it is imperative that you return to your primary care physician (or establish a relationship with a primary care physician if you do not have one) for your aftercare needs so that they can reassess your need for medications and monitor your lab values.

## 2023-08-19 NOTE — Discharge Summary (Signed)
Physician Discharge Summary  Tina Briggs NWG:[id number] DOB: Jun 28, 1946 DOA: 08/06/2023  PCP: [name], MD  Admit date: 08/06/2023 Discharge date: 08/19/2023  Admitted From: (Home) Disposition:  (Home)  Recommendations for Outpatient Follow-up:  Follow up with PCP in 1-2 weeks Please obtain BMP/CBC in one week Please adjust antihypertensive regimen as needed as an outpatient as medication has been changed, please see discussion below Patient need to follow-up with GI as an outpatient, I have discussed with her, she will schedule an appointment with Dr. Ewing Schlein.  Home Health: (YES)  Diet recommendation: Heart Healthy / Carb Modified  Brief/Interim Summary: 77 year old lady with prior history of hypertension hyperlipidemia, diabetes mellitus, IBS, anxiety and depression presents to ED for nausea diarrhea and confusion for 3 days was found to be hypotensive on admission. CT abdomen pelvis suggestive of sigmoid diverticulitis started on IV Zosyn and IV fluids. She was initially admitted to [name] service for sepsis and hypovolemic shock and transferred to [name] on 08/13/2023.    Sepsis/hypovolemic shock secondary to E. coli bacteremia from ascending cholangitis Acute uncomplicated sigmoid diverticulitis with severe diarrhea C. difficile PCR is negative, patient with underlying chronic diarrhea at baseline. She was treated with IV antibiotics, initially on Zosyn, then transition to Rocephin, she finished total of 14 days treatment, no need for further antibiotics at time of discharge  -Physiology of sepsis has resolved.     Intrahepatic biliary duct dilatation/choledocholithiasis/cholangitis MRCP showing choledocholithiasis with possible biliary stricture Underwent ERCP with sphincterotomy with stone extraction as well as temporary plastic biliary stent placement on 11/11. Patient will likely need repeat ERCP  with spyglass for further evaluation of possible remaining CBD stones  and evaluation of biliary stricture to be scheduled as outpatient. Liver enzymes improving. -Status post laparoscopic cholecystectomy in the past   Diarrhea -Patient with chronic diarrhea at home for which she is taking cholestyramine, she is complaining of diarrhea during hospital stay, area much improved after resuming her home dose cholestyramine.   Acute kidney injury on stage IIIb CKD Patient's creatinine between 2.5-3.5 Function much improved, but not back to baseline, so her losartan has been held at time of discharge   Hyponatremia -Resolved   Severe hypoalbuminemia:  PCM Significantly low albumin Started on supplements.   New onset atrial fibrillation with RVR Appears to be single episode, no recurrence in the setting of septic shock with no recurrence, will not initiate anticoagulation at this point as risks outweighed benefits   Type 2 diabetes mellitus A1c of 5.9 Was kept on insulin sliding scale during hospital stay, resume home meds on discharge   History of anxiety and depression  continue with Effexor   Right upper extremity swelling:  From IV infiltration. Venous duplex negative for SVT or DVT.  Elevate the arm and monitor.  Apply warm compress   Hypertension -Pressure initially low-soft, so home meds including losartan and metoprolol been on hold . -Blood pressure started to increase, so I will increase her metoprolol from 25 mg to 50 mg p.o. twice daily blood pressure started to increase,  (home dose is 100 mg p.o. twice daily), as well I added low-dose Norvasc, losartan has been held-discontinued on discharge given worsening renal function.    Obesity Estimated body mass index is 33.19 kg/m as calculated from the following:   Height as of this encounter: 5\' 3"  (1.6 m).   Weight as of this encounter: 85 kg.  Weakness-deconditioning -Was followed by PT-OT, initial recommendation for CIR, but patient much improved where she  will be discharged home with  home health.     Discharge Diagnoses:  Principal Problem:   Septic shock (HCC) Active Problems:   Sepsis (HCC)   Diarrhea   Hypotension   Acute metabolic encephalopathy   Hyperbilirubinemia   AKI (acute kidney injury) [name])    Discharge Instructions  Discharge Instructions     Diet - low sodium heart healthy   Complete by: As directed    Discharge instructions   Complete by: As directed    Follow with Primary MD [name], MD in 7 days   Get CBC, CMP,  checked  by Primary MD next visit.    Activity: As tolerated with Full fall precautions use walker/cane & assistance as needed   Disposition Home    Diet: Heart Healthy    On your next visit with your primary care physician please Get Medicines reviewed and adjusted.   Please request your Prim.MD to go over all Hospital Tests and Procedure/Radiological results at the follow up, please get all Hospital records sent to your Prim MD by signing hospital release before you go home.   If you experience worsening of your admission symptoms, develop shortness of breath, life threatening emergency, suicidal or homicidal thoughts you must seek medical attention immediately by calling 911 or calling your MD immediately  if symptoms less severe.  You Must read complete instructions/literature along with all the possible adverse reactions/side effects for all the Medicines you take and that have been prescribed to you. Take any new Medicines after you have completely understood and accpet all the possible adverse reactions/side effects.   Do not drive, operating heavy machinery, perform activities at heights, swimming or participation in water activities or provide baby sitting services if your were admitted for syncope or siezures until you have seen by Primary MD or a Neurologist and advised to do so again.  Do not drive when taking Pain medications.    Do not take more than prescribed Pain, Sleep and Anxiety  Medications  Special Instructions: If you have smoked or chewed Tobacco  in the last 2 yrs please stop smoking, stop any regular Alcohol  and or any Recreational drug use.  Wear Seat belts while driving.   Please note  You were cared for by a hospitalist during your hospital stay. If you have any questions about your discharge medications or the care you received while you were in the hospital after you are discharged, you can call the unit and asked to speak with the hospitalist on call if the hospitalist that took care of you is not available. Once you are discharged, your primary care physician will handle any further medical issues. Please note that NO REFILLS for any discharge medications will be authorized once you are discharged, as it is imperative that you return to your primary care physician (or establish a relationship with a primary care physician if you do not have one) for your aftercare needs so that they can reassess your need for medications and monitor your lab values.   Increase activity slowly   Complete by: As directed       Allergies as of 08/19/2023       Reactions   Codeine Shortness Of Breath   Clavulanic Acid    Pt tolerated Zosyn 11/6   Flagyl [metronidazole]    Other Other (See Comments)   Prevacid [lansoprazole]    Tetracyclines & Related Itching   Trulicity [dulaglutide]    Azithromycin Itching, Rash  Erythromycin Itching, Rash   Morphine And Codeine Itching, Rash   Septra [sulfamethoxazole-trimethoprim] Itching, Rash        Medication List     STOP taking these medications    cephALEXin 500 MG capsule Commonly known as: KEFLEX   losartan 50 MG tablet Commonly known as: COZAAR   methocarbamol 500 MG tablet Commonly known as: ROBAXIN   methylPREDNISolone 4 MG Tbpk tablet Commonly known as: MEDROL DOSEPAK       TAKE these medications    acetaminophen 325 MG tablet Commonly known as: TYLENOL Take 2 tablets (650 mg total) by mouth  every 6 (six) hours as needed for mild pain (pain score 1-3) or fever. What changed:  medication strength how much to take when to take this reasons to take this   amLODipine 5 MG tablet Commonly known as: NORVASC Take 1 tablet (5 mg total) by mouth daily.   cholecalciferol 1000 units tablet Commonly known as: VITAMIN D Take 5,000 Units by mouth daily.   colestipol 1 g tablet Commonly known as: COLESTID Take 2 tablets (2 g total) by mouth in the morning and at bedtime. What changed: additional instructions   Crestor 40 MG tablet Generic drug: rosuvastatin TAKE 1 TABLET AT BEDTIME What changed:  how much to take Another medication with the same name was removed. Continue taking this medication, and follow the directions you see here.   cyanocobalamin 1000 MCG tablet Commonly known as: VITAMIN B12 Take 1,000 mcg by mouth 3 (three) times a week. Take on Sundays, Wednesdays and Fridays.   dapagliflozin propanediol 5 MG Tabs tablet Commonly known as: FARXIGA Take 5 mg by mouth daily.   desloratadine 5 MG tablet Commonly known as: CLARINEX Take 1 tablet (5 mg total) by mouth daily.   diclofenac Sodium 1 % Gel Commonly known as: VOLTAREN Apply 4 g topically 4 (four) times daily.   fluticasone 50 MCG/ACT nasal spray Commonly known as: FLONASE Place 2 sprays into both nostrils daily as needed for allergies.   FREESTYLE LITE test strip Generic drug: glucose blood USE 1 STRIP TO CHECK GLUCOSE 4 TIMES DAILY   gabapentin 100 MG capsule Commonly known as: NEURONTIN Take 1 capsule (100 mg total) by mouth 2 (two) times daily. What changed:  medication strength how much to take when to take this additional instructions   metoprolol tartrate 50 MG tablet Commonly known as: LOPRESSOR Take 1 tablet (50 mg total) by mouth 2 (two) times daily. What changed:  medication strength how much to take how to take this when to take this additional instructions   NovoLOG  FlexPen 100 UNIT/ML FlexPen Generic drug: insulin aspart Inject 8-10 Units into the skin 3 (three) times daily as needed. BS   Ozempic (0.25 or 0.5 MG/DOSE) 2 MG/3ML Sopn Generic drug: Semaglutide(0.25 or 0.5MG /DOS) Inject 0.5 mg into the skin once a week.   PRESERVISION AREDS 2 PO Take 1 tablet by mouth 2 (two) times daily.   venlafaxine XR 75 MG 24 hr capsule Commonly known as: EFFEXOR-XR Take 75 mg by mouth every morning.               Durable Medical Equipment  (From admission, onward)           Start     Ordered   08/19/23 1123  For home use only DME Bedside commode  Once       Question:  Patient needs a bedside commode to treat with the following condition  Answer:  Septicemia (HCC)   08/19/23 1124   08/19/23 1123  For home use only DME lightweight manual wheelchair with seat cushion  Once       Comments: Patient suffers from metabolic encephalopathy, HTN, generalized weakness   which impairs their ability to perform daily activities like bathing, dressing, feeding, grooming, and toileting in the home.  A cane, crutch, or walker will not resolve  issue with performing activities of daily living. A wheelchair will allow patient to safely perform daily activities. Patient is not able to propel themselves in the home using a standard weight wheelchair due to arm weakness, endurance, and general weakness. Patient can self propel in the lightweight wheelchair. Length of need Lifetime. Accessories: elevating leg rests (ELRs), wheel locks, extensions and anti-tippers.   08/19/23 1124            Follow-up Information     [name], MD Follow up.   Specialty: Internal Medicine Contact information: [address] [place] [address] [phone]         [facility] Follow up.   Specialty: Home Health Services Why: Home health has been arranged. they will contact you to schedule apt within 48hrs post discharge. Contact  information: [address] [place] [address] [phone]         [employer] Follow up.   Why: [employer] is orviding the bedside commode and wheelchair Contact information: [address]  [place], [place]  [address]  [phone]               Allergies  Allergen Reactions   Codeine Shortness Of Breath   Clavulanic Acid     Pt tolerated Zosyn 11/6   Flagyl [Metronidazole]    Other Other (See Comments)   Prevacid [Lansoprazole]    Tetracyclines & Related Itching   Trulicity [Dulaglutide]    Azithromycin Itching and Rash   Erythromycin Itching and Rash   Morphine And Codeine Itching and Rash   Septra [Sulfamethoxazole-Trimethoprim] Itching and Rash    Consultations: CCM  Gastroenterology Neurology  Procedures/Studies: VAS [place] UPPER EXTREMITY VENOUS DUPLEX  Result Date: 08/12/2023 UPPER VENOUS STUDY  Patient Name:  Tina Briggs  Date of Exam:   08/12/2023 Medical Rec #: [id number]            Accession #:    [id number] Date of Birth: 10-07-1945           Patient Gender: F Patient Age:   40 years Exam Location:  [facility] Procedure:      VAS [place] UPPER EXTREMITY VENOUS DUPLEX Referring Phys: Melody Comas --------------------------------------------------------------------------------  Indications: Swelling Comparison Study: No priors. Performing Technologist: Marilynne Halsted RDMS, RVT  Examination Guidelines: A complete evaluation includes B-mode imaging, spectral Doppler, color Doppler, and power Doppler as needed of all accessible portions of each vessel. Bilateral testing is considered an integral part of a complete examination. Limited examinations for reoccurring indications may be performed as noted.  Right Findings: +----------+------------+---------+-----------+----------+-------+ RIGHT     CompressiblePhasicitySpontaneousPropertiesSummary +----------+------------+---------+-----------+----------+-------+ IJV           Full        Yes       Yes                      +----------+------------+---------+-----------+----------+-------+ Subclavian    Full       Yes       Yes                      +----------+------------+---------+-----------+----------+-------+  Axillary      Full       Yes       Yes                      +----------+------------+---------+-----------+----------+-------+ Brachial      Full                                          +----------+------------+---------+-----------+----------+-------+ Radial        Full                                          +----------+------------+---------+-----------+----------+-------+ Ulnar         Full                                          +----------+------------+---------+-----------+----------+-------+ Cephalic      Full                                          +----------+------------+---------+-----------+----------+-------+ Basilic       Full                                          +----------+------------+---------+-----------+----------+-------+  Left Findings: +----------+------------+---------+-----------+----------+-------+ LEFT      CompressiblePhasicitySpontaneousPropertiesSummary +----------+------------+---------+-----------+----------+-------+ Subclavian               Yes       Yes                      +----------+------------+---------+-----------+----------+-------+  Summary:  Right: No evidence of superficial vein thrombosis in the upper extremity.  Left: No evidence of thrombosis in the subclavian.  *See table(s) above for measurements and observations.  Diagnosing physician: Lemar Livings MD Electronically signed by Lemar Livings MD on 08/12/2023 at 7:01:04 PM.    Final    DG ERCP  Result Date: 08/11/2023 CLINICAL DATA:  77 year old female with choledocholithiasis and acute cholangitis. She presents for ERCP. EXAM: ERCP TECHNIQUE: Multiple spot images obtained with the fluoroscopic device and  submitted for interpretation post-procedure. FLUOROSCOPY: Radiation Exposure Index (as provided by the fluoroscopic device): 191.03 mGy Kerma COMPARISON:  CT abdomen/pelvis 08/09/2023 FINDINGS: A total of 4 intraoperative saved images are submitted for review. The initial image demonstrates a flexible duodenal scope in the descending duodenum with deep wire cannulation of the right intrahepatic ducts. Surgical clips are present in the region of the cystic duct consistent with prior cholecystectomy. Initial cholangiography demonstrates diffuse dilation of the intra and extrahepatic biliary tree with multiple filling defects in the common duct consistent with choledocholithiasis. Subsequent images demonstrate sphincterotomy and balloon sweeping of the common duct. IMPRESSION: 1. Choledocholithiasis with diffuse biliary ductal dilatation. 2. ERCP with sphincterotomy and balloon sweeping of the common duct. 3. Surgical changes of prior cholecystectomy. These images were submitted for radiologic interpretation only. Please see the procedural report for the amount of contrast and the fluoroscopy time utilized. Electronically Signed   By: Isac Caddy.D.  On: 08/11/2023 12:47   CT HEAD WO CONTRAST ( )  Result Date: 08/09/2023 CLINICAL DATA:  Initial evaluation for mental status change, unknown cause. EXAM: CT HEAD WITHOUT CONTRAST TECHNIQUE: Contiguous axial images were obtained from the base of the skull through the vertex without intravenous contrast. RADIATION DOSE REDUCTION: This exam was performed according to the departmental dose-optimization program which includes automated exposure control, adjustment of the mA and/or kV according to patient size and/or use of iterative reconstruction technique. COMPARISON:  Prior study from 08/06/2023. FINDINGS: Brain: Cerebral volume within normal limits. Mild chronic microvascular ischemic disease. No acute intracranial hemorrhage. No acute large vessel territory  infarct. No mass lesion or midline shift. No hydrocephalus or extra-axial fluid collection. Vascular: No abnormal hyperdense vessel. Skull: Scalp soft tissues demonstrate no acute finding. Calvarium intact. Sinuses/Orbits: Globes orbital soft tissues within normal limits. Mild mucoperiosteal thickening present about the ethmoidal air cells. Paranasal sinuses are otherwise clear. No mastoid effusion. Other: None. IMPRESSION: 1. No acute intracranial abnormality. 2. Mild chronic microvascular ischemic disease. Electronically Signed   By: Rise Mu M.D.   On: 08/09/2023 18:58   CT ABDOMEN PELVIS WO CONTRAST  Result Date: 08/09/2023 CLINICAL DATA:  Sepsis EXAM: CT ABDOMEN AND PELVIS WITHOUT CONTRAST TECHNIQUE: Multidetector CT imaging of the abdomen and pelvis was performed following the standard protocol without IV contrast. RADIATION DOSE REDUCTION: This exam was performed according to the departmental dose-optimization program which includes automated exposure control, adjustment of the mA and/or kV according to patient size and/or use of iterative reconstruction technique. COMPARISON:  CT abdomen and pelvis 10/13/2018. CT abdomen and pelvis 01/12/2021. CT abdomen and pelvis 08/06/2023. MRI abdomen 07/31/2023. FINDINGS: Lower chest: There are small bilateral pleural effusions with bibasilar atelectasis. Hepatobiliary: The gallbladder is surgically absent. There is stable intrahepatic biliary ductal dilatation in the left lobe of the liver. Hyperdensities are seen in the common bile duct measuring up to 14 mm distally which may be related to choledocholithiasis. Hyperdensity near the pancreatic head is new from prior and may represent new stone. No focal liver lesion identified allowing for lack of intravenous contrast. Pancreas: There is mild inflammatory stranding surrounding the head of the pancreas. No ductal dilatation or fluid collection identified. Spleen: Normal in size without focal  abnormality. Adrenals/Urinary Tract: Adrenal glands are unremarkable. Kidneys are normal, without renal calculi, focal lesion, or hydronephrosis. Bladder is unremarkable. Stomach/Bowel: There is descending and sigmoid colon diverticulosis. Large hiatal hernia is again seen. There is wall thickening and mild inflammation surrounding the duodenal. Duodenal diverticulum present. No free air, bowel obstruction or pneumatosis. Vascular/Lymphatic: Aortic atherosclerosis. No enlarged abdominal or pelvic lymph nodes. Reproductive: Status post hysterectomy. No adnexal masses. Other: There is trace free fluid in the lower abdomen. There is no focal abdominal wall hernia. There is mild body wall edema. Musculoskeletal: L4-5 posterior fusion hardware is present. IMPRESSION: 1. Mild inflammatory stranding surrounding the head of the pancreas and duodenum compatible with acute pancreatitis and duodenitis. 2. New hyperdensities in the common bile duct measuring up to 14 mm distally may be related to choledocholithiasis. There is stable intrahepatic biliary ductal dilatation in the left lobe of the liver. 3. Small bilateral pleural effusions with bibasilar atelectasis. 4. Trace free fluid in the lower abdomen. 5. Large hiatal hernia. 6. Colonic diverticulosis. 7. Mild body wall edema. 8. Aortic atherosclerosis. Aortic Atherosclerosis (ICD10-I70.0). Electronically Signed   By: Darliss Cheney M.D.   On: 08/09/2023 17:27   US Abdomen Limited RUQ (LIVER/GB)  Result Date: 08/08/2023  CLINICAL DATA:  History of prior cholecystectomy presenting with hyperbilirubinemia. EXAM: ULTRASOUND ABDOMEN LIMITED RIGHT UPPER QUADRANT COMPARISON:  May 29, 2015 FINDINGS: Gallbladder: The gallbladder is surgically absent. Common bile duct: Diameter: 4.6 mm Liver: No focal lesion identified. Within normal limits in parenchymal echogenicity. Portal vein is patent on color Doppler imaging with normal direction of blood flow towards the liver. Other:  Limited study secondary to limited patient positioning. IMPRESSION: 1. Findings consistent with history of prior cholecystectomy. 2. Otherwise, unremarkable right upper quadrant ultrasound. Electronically Signed   By: Aram Candela M.D.   On: 08/08/2023 23:38   ECHOCARDIOGRAM COMPLETE  Result Date: 08/07/2023    ECHOCARDIOGRAM REPORT   Patient Name:   KINAYA MICHAELIS Date of Exam: 08/07/2023 Medical Rec #:  [id number]           Height:       63.0 in Accession #:    [id number]          Weight:       173.9 lb Date of Birth:  17-Aug-1946          BSA:          1.822 m Patient Age:    76 years            BP:           125/62 mmHg Patient Gender: F                   HR:           96 bpm. Exam Location:  Inpatient Procedure: 2D Echo, Cardiac Doppler and Color Doppler Indications:    Stroke I63.9  History:        Patient has prior history of Echocardiogram examinations, most                 recent 01/12/2021. Signs/Symptoms:Hypotension; Risk                 Factors:Hypertension, Diabetes and Dyslipidemia. CKD, stage 3.  Sonographer:    Lucendia Herrlich RCS Referring Phys: [id number] OMAR M ALBUSTAMI IMPRESSIONS  1. Left ventricular ejection fraction, by estimation, is 60 to 65%. The left ventricle has normal function. The left ventricle has no regional wall motion abnormalities. Left ventricular diastolic parameters are indeterminate.  2. Right ventricular systolic function is normal. The right ventricular size is mildly enlarged. There is mildly elevated pulmonary artery systolic pressure. The estimated right ventricular systolic pressure is 38.0 mmHg.  3. Left atrial size was moderately dilated.  4. The mitral valve is degenerative. Trivial mitral valve regurgitation. No evidence of mitral stenosis. Severe mitral annular calcification.  5. The aortic valve is abnormal. There is mild calcification of the aortic valve. Aortic valve regurgitation is not visualized. Aortic valve sclerosis/calcification is present,  without any evidence of aortic stenosis.  6. The inferior vena cava is normal in size with greater than 50% respiratory variability, suggesting right atrial pressure of 3 mmHg. Conclusion(s)/Recommendation(s): No intracardiac source of embolism detected on this transthoracic study. Consider a transesophageal echocardiogram to exclude cardiac source of embolism if clinically indicated. FINDINGS  Left Ventricle: Left ventricular ejection fraction, by estimation, is 60 to 65%. The left ventricle has normal function. The left ventricle has no regional wall motion abnormalities. The left ventricular internal cavity size was normal in size. There is  no left ventricular hypertrophy. Left ventricular diastolic parameters are indeterminate. Right Ventricle: The right ventricular size is mildly enlarged. No increase in right ventricular wall  thickness. Right ventricular systolic function is normal. There is mildly elevated pulmonary artery systolic pressure. The tricuspid regurgitant velocity is 2.96 m/s, and with an assumed right atrial pressure of 3 mmHg, the estimated right ventricular systolic pressure is 38.0 mmHg. Left Atrium: Left atrial size was moderately dilated. Right Atrium: Right atrial size was normal in size. Pericardium: There is no evidence of pericardial effusion. Presence of epicardial fat layer. Mitral Valve: The mitral valve is degenerative in appearance. Severe mitral annular calcification. Trivial mitral valve regurgitation. No evidence of mitral valve stenosis. The mean mitral valve gradient is 2.4 mmHg. Tricuspid Valve: The tricuspid valve is normal in structure. Tricuspid valve regurgitation is mild . No evidence of tricuspid stenosis. Aortic Valve: The aortic valve is abnormal. There is mild calcification of the aortic valve. Aortic valve regurgitation is not visualized. Aortic valve sclerosis/calcification is present, without any evidence of aortic stenosis. Aortic valve mean gradient measures 6.8  mmHg. Aortic valve peak gradient measures 11.6 mmHg. Aortic valve area, by VTI measures 2.01 cm. Pulmonic Valve: The pulmonic valve was normal in structure. Pulmonic valve regurgitation is mild. No evidence of pulmonic stenosis. Aorta: The aortic root is normal in size and structure. Venous: The inferior vena cava is normal in size with greater than 50% respiratory variability, suggesting right atrial pressure of 3 mmHg. IAS/Shunts: The interatrial septum was not well visualized.  LEFT VENTRICLE PLAX 2D LVIDd:         3.80 cm   Diastology LVIDs:         2.40 cm   LV e' medial:    8.70 cm/s LV PW:         0.80 cm   LV E/e' medial:  10.5 LV IVS:        0.70 cm   LV e' lateral:   10.30 cm/s LVOT diam:     2.00 cm   LV E/e' lateral: 8.9 LV SV:         55 LV SV Index:   30 LVOT Area:     3.14 cm  RIGHT VENTRICLE             IVC RV S prime:     20.00 cm/s  IVC diam: 2.00 cm TAPSE (M-mode): 1.7 cm LEFT ATRIUM             Index        RIGHT ATRIUM           Index LA diam:        3.00 cm 1.65 cm/m   RA Area:     13.50 cm LA Vol (A2C):   29.4 ml 16.13 ml/m  RA Volume:   31.10 ml  17.07 ml/m LA Vol (A4C):   46.2 ml 25.35 ml/m LA Biplane Vol: 40.8 ml 22.39 ml/m  AORTIC VALVE AV Area (Vmax):    2.16 cm AV Area (Vmean):   2.00 cm AV Area (VTI):     2.01 cm AV Vmax:           170.16 cm/s AV Vmean:          123.268 cm/s AV VTI:            0.273 m AV Peak Grad:      11.6 mmHg AV Mean Grad:      6.8 mmHg LVOT Vmax:         116.75 cm/s LVOT Vmean:        78.325 cm/s LVOT VTI:  0.175 m LVOT/AV VTI ratio: 0.64  AORTA Ao Root diam: 3.10 cm Ao Asc diam:  3.10 cm MITRAL VALVE                TRICUSPID VALVE MV Area (PHT): 4.17 cm     TR Peak grad:   35.0 mmHg MV Mean grad:  2.4 mmHg     TR Vmax:        296.00 cm/s MV Decel Time: 182 msec MV E velocity: 91.50 cm/s   SHUNTS MV A velocity: 106.00 cm/s  Systemic VTI:  0.17 m MV E/A ratio:  0.86         Systemic Diam: 2.00 cm Weston Brass MD Electronically signed by  Weston Brass MD Signature Date/Time: 08/07/2023/1:46:27 PM    Final    MR BRAIN WO CONTRAST  Result Date: 08/06/2023 CLINICAL DATA:  Acute neurologic deficit EXAM: MRI HEAD WITHOUT CONTRAST TECHNIQUE: Multiplanar, multiecho pulse sequences of the brain and surrounding structures were obtained without intravenous contrast. COMPARISON:  None Available. FINDINGS: Brain: No acute infarct, mass effect or extra-axial collection. No acute or chronic hemorrhage. There is multifocal hyperintense T2-weighted signal within the white matter. Parenchymal volume and CSF spaces are normal. The midline structures are normal. Vascular: Normal flow voids. Skull and upper cervical spine: Normal calvarium and skull base. Visualized upper cervical spine and soft tissues are normal. Sinuses/Orbits:No paranasal sinus fluid levels or advanced mucosal thickening. No mastoid or middle ear effusion. Normal orbits. Ocular lens replacements. IMPRESSION: 1. No acute intracranial abnormality. 2. Findings of chronic small vessel ischemia. Electronically Signed   By: Deatra Robinson M.D.   On: 08/06/2023 21:35   CT HEAD CODE STROKE WO CONTRAST`  Result Date: 08/06/2023 CLINICAL DATA:  Code stroke. Altered mental status. Aphasia and facial droop. EXAM: CT HEAD WITHOUT CONTRAST TECHNIQUE: Contiguous axial images were obtained from the base of the skull through the vertex without intravenous contrast. RADIATION DOSE REDUCTION: This exam was performed according to the departmental dose-optimization program which includes automated exposure control, adjustment of the mA and/or kV according to patient size and/or use of iterative reconstruction technique. COMPARISON:  None Available. FINDINGS: Brain: No acute intracranial hemorrhage. Gray-white differentiation is preserved. No hydrocephalus or extra-axial collection. No mass effect or midline shift. Vascular: No hyperdense vessel or unexpected calcification. Skull: No calvarial fracture or  suspicious bone lesion. Skull base is unremarkable. Sinuses/Orbits: No acute finding. Other: None. ASPECTS ([place] Stroke Program Early CT Score) - Ganglionic level infarction (caudate, lentiform nuclei, internal capsule, insula, M1-M3 cortex): 7 - Supraganglionic infarction (M4-M6 cortex): 3 Total score (0-10 with 10 being normal): 10 IMPRESSION: No acute intracranial hemorrhage or evidence of acute large vessel territory infarct. ASPECT score is 10. Code stroke imaging results were communicated on 08/06/2023 at 8:37 pm to provider Dr. Derry Lory via secure text paging. Electronically Signed   By: Orvan Falconer M.D.   On: 08/06/2023 20:41   CT ABDOMEN PELVIS WO CONTRAST  Result Date: 08/06/2023 CLINICAL DATA:  Diarrhea hypotension, diarrhea, sepsis. EXAM: CT ABDOMEN AND PELVIS WITHOUT CONTRAST TECHNIQUE: Multidetector CT imaging of the abdomen and pelvis was performed following the standard protocol without IV contrast. RADIATION DOSE REDUCTION: This exam was performed according to the departmental dose-optimization program which includes automated exposure control, adjustment of the mA and/or kV according to patient size and/or use of iterative reconstruction technique. COMPARISON:  CT scan abdomen and pelvis from 01/12/2021. FINDINGS: Lower chest: There are patchy atelectatic changes in the visualized lung bases. No overt  consolidation. No pleural effusion. The heart is normal in size. No pericardial effusion. Hepatobiliary: The liver is normal in size. Non-cirrhotic configuration. Surgically absent gallbladder. There is small amount of air in the extrahepatic bile duct and right intrahepatic bile duct, likely from prior sphincterotomy. Redemonstration of thick/hyperattenuating wall of the left main hepatic duct which was better evaluated on the recent MRI. Pancreas: Unremarkable. No pancreatic ductal dilatation or surrounding inflammatory changes. Spleen: Within normal limits. No focal lesion.  Adrenals/Urinary Tract: Adrenal glands are unremarkable. No suspicious renal mass. There is an ill-defined approximately 1 cm sized hypoattenuating focus in the left kidney interpolar region, laterally, which corresponds to a simple cyst on prior MRI. No hydronephrosis. No renal or ureteric calculi. Unremarkable urinary bladder. Stomach/Bowel: There is a moderate sized sliding hiatal hernia. There are multiple diverticula arising from the duodenal as well as jejunum. There is medium length segment of colon (approximately 10 cm) at the level of junction of descending colon and proximal sigmoid colon, exhibiting mild irregular wall thickening and pericolonic fat stranding on the background of several diverticula, compatible with acute uncomplicated diverticulitis. No walled off abscess or loculated collection. No pneumoperitoneum. No disproportionate dilation of small or large bowel loops to suggest bowel obstruction. The appendix was not visualized; however there is no acute inflammatory process in the right lower quadrant. Vascular/Lymphatic: No ascites or pneumoperitoneum. No abdominal or pelvic lymphadenopathy, by size criteria. No aneurysmal dilation of the major abdominal arteries. There are mild peripheral atherosclerotic vascular calcifications of the aorta and its major branches. Reproductive: The uterus is surgically absent. No large adnexal mass. Other: There is a tiny fat containing umbilical hernia. The soft tissues and abdominal wall are otherwise unremarkable. Musculoskeletal: No suspicious osseous lesions. There are mild multilevel degenerative changes in the visualized spine. L4-5 posterior spinal fixation noted. IMPRESSION: *Findings favor acute uncomplicated diverticulitis at the junction of descending colon and proximal sigmoid colon. *Redemonstration of thick/hyperattenuating wall of the left main hepatic duct better evaluated on recent MRI abdomen. Please refer to MRI abdomen report for details.  *Multiple other nonacute observations, as described above. Electronically Signed   By: Jules Schick M.D.   On: 08/06/2023 16:13   DG Chest Port 1 View  Result Date: 08/06/2023 CLINICAL DATA:  Questionable sepsis - evaluate for abnormality. Diarrhea. Nausea. EXAM: PORTABLE CHEST 1 VIEW COMPARISON:  01/11/2021. FINDINGS: Bilateral lung fields are clear. Bilateral costophrenic angles are clear. Normal cardio-mediastinal silhouette. No acute osseous abnormalities. The soft tissues are within normal limits. IMPRESSION: *No active disease. Electronically Signed   By: Jules Schick M.D.   On: 08/06/2023 15:59   MR ABDOMEN MRCP W WO CONTAST  Result Date: 08/01/2023 CLINICAL DATA:  Dilated bile duct, right upper quadrant pain EXAM: MRI ABDOMEN WITHOUT AND WITH CONTRAST (INCLUDING MRCP) TECHNIQUE: Multiplanar multisequence MR imaging of the abdomen was performed both before and after the administration of intravenous contrast. Heavily T2-weighted images of the biliary and pancreatic ducts were obtained, and three-dimensional MRCP images were rendered by post processing. CONTRAST:  7.5 mL Vueway gadolinium contrast IV COMPARISON:  None Available. FINDINGS: Lower chest: No acute abnormality. Moderate hiatal hernia with intrathoracic position of the gastric fundus. Hepatobiliary: Ill-defined soft tissue stranding within the porta hepatis near the confluence of the left and right bile ducts, with focal stricturing of the left lobe bile duct and severe left-sided intrahepatic biliary ductal dilatation (series 19, image 55, series 5, image 15, series 4, image 21, series 8, image 22). The common hepatic duct  is mildly dilated, measuring up to 0.8 cm, most likely reflecting mild postoperative biliary ductal dilatation, and the common bile duct tapers to the ampulla without calculus or other obstruction. Status post cholecystectomy. Pancreas: Unremarkable. No pancreatic ductal dilatation or surrounding inflammatory changes.  Spleen: Normal in size without significant abnormality. Adrenals/Urinary Tract: Adrenal glands are unremarkable. Simple, benign fluid signal renal cortical cysts, for which no further follow-up or characterization is required. Kidneys are otherwise normal, without renal calculi, solid lesion, or hydronephrosis. Stomach/Bowel: Stomach is within normal limits. Incidental ascending duodenal diverticulum. No evidence of bowel wall thickening, distention, or inflammatory changes. Vascular/Lymphatic: No significant vascular findings are present. No enlarged abdominal lymph nodes. Other: No abdominal wall hernia or abnormality. No ascites. Musculoskeletal: No acute or significant osseous findings. IMPRESSION: 1. Ill-defined soft tissue stranding within the porta hepatis near the confluence of the left and right bile ducts, with focal stricturing of the left lobe bile duct and severe left-sided intrahepatic biliary ductal dilatation. 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 2. The common hepatic duct is mildly dilated, measuring up to 0.8 cm, most likely reflecting mild postoperative biliary ductal dilatation, and the common bile duct tapers to the ampulla without calculus or other obstruction. 3. Status post cholecystectomy. 4. No evidence of lymphadenopathy or metastatic disease in the abdomen. 5. Moderate hiatal hernia with intrathoracic position of the gastric fundus. These results will be called to the ordering clinician or representative by the Radiologist Assistant, and communication documented in the PACS or [employer]. Electronically Signed   By: Jearld Lesch M.D.   On: 08/01/2023 07:20   (Echo, Carotid, EGD, Colonoscopy, ERCP)    Subjective:  Significant events overnight, she denies any complaints today, eager to go home. Discharge Exam: Vitals:   08/19/23 0907 08/19/23 1120  BP: (!) 141/70 (!) 117/57  Pulse: 60 65  Resp:  17  Temp:  98.3 F (36.8 C)  SpO2:  97%    Vitals:   08/19/23 0417 08/19/23 0745 08/19/23 0907 08/19/23 1120  BP: (!) 154/58 (!) 157/126 (!) 141/70 (!) 117/57  Pulse: 64 64 60 65  Resp: 17 17  17   Temp: 98.7 F (37.1 C) 98.3 F (36.8 C)  98.3 F (36.8 C)  TempSrc: Oral Oral  Oral  SpO2: 91% 95%  97%  Weight:      Height:        General: Pt is alert, awake, not in acute distress Cardiovascular: RRR, S1/S2 +, no rubs, no gallops Respiratory: CTA bilaterally, no wheezing, no rhonchi Abdominal: Soft, NT, ND, bowel sounds + Extremities: no edema, no cyanosis    The results of significant diagnostics from this hospitalization (including imaging, microbiology, ancillary and laboratory) are listed below for reference.     Microbiology: Recent Results (from the past 240 hour(s))  Culture, blood (Routine X 2) w Reflex to ID Panel     Status: None   Collection Time: 08/09/23  4:49 PM   Specimen: BLOOD LEFT HAND  Result Value Ref Range Status   Specimen Description BLOOD LEFT HAND  Final   Special Requests   Final    BOTTLES DRAWN AEROBIC ONLY Blood Culture results may not be optimal due to an inadequate volume of blood received in culture bottles   Culture   Final    NO GROWTH 5 DAYS Performed at [facility], [address], [place], [place] [address]    Report Status 08/14/2023 FINAL  Final  Culture, blood (Routine X 2)  w Reflex to ID Panel     Status: None   Collection Time: 08/09/23  4:58 PM   Specimen: BLOOD RIGHT HAND  Result Value Ref Range Status   Specimen Description BLOOD RIGHT HAND  Final   Special Requests   Final    BOTTLES DRAWN AEROBIC ONLY Blood Culture results may not be optimal due to an inadequate volume of blood received in culture bottles   Culture   Final    NO GROWTH 5 DAYS Performed at [facility], [address], [place], [place] [address]    Report Status 08/14/2023 FINAL  Final     Labs: BNP (last 3 results) No results for input(s): "BNP" in the last 8760 hours. Basic  Metabolic Panel: Recent Labs  Lab 08/13/23 0307 08/14/23 0559 08/15/23 0304 08/15/23 0431 08/16/23 0338 08/17/23 0315  NA 128* 133* 139  --  141 138  K 3.8 4.3 3.7  --  3.7 3.8  CL 98 103 109  --  111 109  CO2 18* 20* 21*  --  22 22  GLUCOSE 90 109* 115*  --  140* 101*  BUN 66* 65* 53*  --  40* 32*  CREATININE 3.40* 3.75* 2.99*  --  2.39* 2.03*  CALCIUM 7.1* 7.5* 7.7*  --  7.8* 7.6*  PHOS  --   --   --  4.3  --   --    Liver Function Tests: Recent Labs  Lab 08/15/23 0304  AST 21  ALT 26  ALKPHOS 231*  BILITOT 0.8  PROT 5.0*  ALBUMIN <1.5*   No results for input(s): "LIPASE", "AMYLASE" in the last 168 hours. No results for input(s): "AMMONIA" in the last 168 hours. CBC: Recent Labs  Lab 08/14/23 1101 08/15/23 0304 08/16/23 0338 08/17/23 0315 08/18/23 0432  WBC 12.0* 9.1 6.3 6.2 5.9  HGB 9.7* 8.2* 7.7* 7.5* 7.9*  HCT 29.5* 24.9* 24.1* 23.5* 24.6*  MCV 89.1 88.6 91.3 91.1 92.1  PLT 212 222 216 209 199   Cardiac Enzymes: No results for input(s): "CKTOTAL", "CKMB", "CKMBINDEX", "TROPONINI" in the last 168 hours. BNP: Invalid input(s): "POCBNP" CBG: Recent Labs  Lab 08/18/23 0808 08/18/23 1228 08/18/23 1616 08/18/23 2014 08/19/23 0830  GLUCAP 91 123* 150* 149* 81   D-Dimer No results for input(s): "DDIMER" in the last 72 hours. Hgb A1c No results for input(s): "HGBA1C" in the last 72 hours. Lipid Profile No results for input(s): "CHOL", "HDL", "LDLCALC", "TRIG", "CHOLHDL", "LDLDIRECT" in the last 72 hours. Thyroid function studies No results for input(s): "TSH", "T4TOTAL", "T3FREE", "THYROIDAB" in the last 72 hours.  Invalid input(s): "FREET3" Anemia work up No results for input(s): "VITAMINB12", "FOLATE", "FERRITIN", "TIBC", "IRON", "RETICCTPCT" in the last 72 hours. Urinalysis    Component Value Date/Time   COLORURINE YELLOW 08/06/2023 1424   APPEARANCEUR HAZY (A) 08/06/2023 1424   LABSPEC 1.011 08/06/2023 1424   PHURINE 5.5 08/06/2023 1424    GLUCOSEU 250 (A) 08/06/2023 1424   HGBUR MODERATE (A) 08/06/2023 1424   BILIRUBINUR NEGATIVE 08/06/2023 1424   KETONESUR NEGATIVE 08/06/2023 1424   PROTEINUR 30 (A) 08/06/2023 1424   NITRITE NEGATIVE 08/06/2023 1424   LEUKOCYTESUR MODERATE (A) 08/06/2023 1424   Sepsis Labs Recent Labs  Lab 08/15/23 0304 08/16/23 0338 08/17/23 0315 08/18/23 0432  WBC 9.1 6.3 6.2 5.9   Microbiology Recent Results (from the past 240 hour(s))  Culture, blood (Routine X 2) w Reflex to ID Panel     Status: None   Collection  Time: 08/09/23  4:49 PM   Specimen: BLOOD LEFT HAND  Result Value Ref Range Status   Specimen Description BLOOD LEFT HAND  Final   Special Requests   Final    BOTTLES DRAWN AEROBIC ONLY Blood Culture results may not be optimal due to an inadequate volume of blood received in culture bottles   Culture   Final    NO GROWTH 5 DAYS Performed at [facility], [address], [place], [place] [address]    Report Status 08/14/2023 FINAL  Final  Culture, blood (Routine X 2) w Reflex to ID Panel     Status: None   Collection Time: 08/09/23  4:58 PM   Specimen: BLOOD RIGHT HAND  Result Value Ref Range Status   Specimen Description BLOOD RIGHT HAND  Final   Special Requests   Final    BOTTLES DRAWN AEROBIC ONLY Blood Culture results may not be optimal due to an inadequate volume of blood received in culture bottles   Culture   Final    NO GROWTH 5 DAYS Performed at [facility], [address] [place], [place] [address]    Report Status 08/14/2023 FINAL  Final     Time coordinating discharge: Over 30 minutes  SIGNED:   Huey Bienenstock, MD  [facility] 08/19/2023, 12:00 PM Pager   If 7PM-7AM, please contact night-coverage [email or web address] Password [email or web address]

## 2023-08-22 DIAGNOSIS — D509 Iron deficiency anemia, unspecified: Secondary | ICD-10-CM | POA: Diagnosis not present

## 2023-08-22 DIAGNOSIS — D696 Thrombocytopenia, unspecified: Secondary | ICD-10-CM | POA: Diagnosis not present

## 2023-08-22 DIAGNOSIS — N2581 Secondary hyperparathyroidism of renal origin: Secondary | ICD-10-CM | POA: Diagnosis not present

## 2023-08-22 DIAGNOSIS — E1122 Type 2 diabetes mellitus with diabetic chronic kidney disease: Secondary | ICD-10-CM | POA: Diagnosis not present

## 2023-08-22 DIAGNOSIS — M5116 Intervertebral disc disorders with radiculopathy, lumbar region: Secondary | ICD-10-CM | POA: Diagnosis not present

## 2023-08-22 DIAGNOSIS — E539 Vitamin B deficiency, unspecified: Secondary | ICD-10-CM | POA: Diagnosis not present

## 2023-08-22 DIAGNOSIS — M722 Plantar fascial fibromatosis: Secondary | ICD-10-CM | POA: Diagnosis not present

## 2023-08-22 DIAGNOSIS — G9341 Metabolic encephalopathy: Secondary | ICD-10-CM | POA: Diagnosis not present

## 2023-08-22 DIAGNOSIS — D631 Anemia in chronic kidney disease: Secondary | ICD-10-CM | POA: Diagnosis not present

## 2023-08-22 DIAGNOSIS — I4891 Unspecified atrial fibrillation: Secondary | ICD-10-CM | POA: Diagnosis not present

## 2023-08-22 DIAGNOSIS — K838 Other specified diseases of biliary tract: Secondary | ICD-10-CM | POA: Diagnosis not present

## 2023-08-22 DIAGNOSIS — H3530 Unspecified macular degeneration: Secondary | ICD-10-CM | POA: Diagnosis not present

## 2023-08-22 DIAGNOSIS — M461 Sacroiliitis, not elsewhere classified: Secondary | ICD-10-CM | POA: Diagnosis not present

## 2023-08-22 DIAGNOSIS — F325 Major depressive disorder, single episode, in full remission: Secondary | ICD-10-CM | POA: Diagnosis not present

## 2023-08-22 DIAGNOSIS — K5732 Diverticulitis of large intestine without perforation or abscess without bleeding: Secondary | ICD-10-CM | POA: Diagnosis not present

## 2023-08-22 DIAGNOSIS — N052 Unspecified nephritic syndrome with diffuse membranous glomerulonephritis: Secondary | ICD-10-CM | POA: Diagnosis not present

## 2023-08-22 DIAGNOSIS — E1141 Type 2 diabetes mellitus with diabetic mononeuropathy: Secondary | ICD-10-CM | POA: Diagnosis not present

## 2023-08-22 DIAGNOSIS — E782 Mixed hyperlipidemia: Secondary | ICD-10-CM | POA: Diagnosis not present

## 2023-08-22 DIAGNOSIS — I129 Hypertensive chronic kidney disease with stage 1 through stage 4 chronic kidney disease, or unspecified chronic kidney disease: Secondary | ICD-10-CM | POA: Diagnosis not present

## 2023-08-22 DIAGNOSIS — A4151 Sepsis due to Escherichia coli [E. coli]: Secondary | ICD-10-CM | POA: Diagnosis not present

## 2023-08-22 DIAGNOSIS — F419 Anxiety disorder, unspecified: Secondary | ICD-10-CM | POA: Diagnosis not present

## 2023-08-22 DIAGNOSIS — K219 Gastro-esophageal reflux disease without esophagitis: Secondary | ICD-10-CM | POA: Diagnosis not present

## 2023-08-22 DIAGNOSIS — E8809 Other disorders of plasma-protein metabolism, not elsewhere classified: Secondary | ICD-10-CM | POA: Diagnosis not present

## 2023-08-22 DIAGNOSIS — N1832 Chronic kidney disease, stage 3b: Secondary | ICD-10-CM | POA: Diagnosis not present

## 2023-08-27 DIAGNOSIS — A419 Sepsis, unspecified organism: Secondary | ICD-10-CM | POA: Diagnosis not present

## 2023-08-27 DIAGNOSIS — F322 Major depressive disorder, single episode, severe without psychotic features: Secondary | ICD-10-CM | POA: Diagnosis not present

## 2023-08-27 DIAGNOSIS — I10 Essential (primary) hypertension: Secondary | ICD-10-CM | POA: Diagnosis not present

## 2023-08-27 DIAGNOSIS — K5792 Diverticulitis of intestine, part unspecified, without perforation or abscess without bleeding: Secondary | ICD-10-CM | POA: Diagnosis not present

## 2023-08-27 DIAGNOSIS — K8309 Other cholangitis: Secondary | ICD-10-CM | POA: Diagnosis not present

## 2023-08-27 DIAGNOSIS — K8050 Calculus of bile duct without cholangitis or cholecystitis without obstruction: Secondary | ICD-10-CM | POA: Diagnosis not present

## 2023-08-27 DIAGNOSIS — E8809 Other disorders of plasma-protein metabolism, not elsewhere classified: Secondary | ICD-10-CM | POA: Diagnosis not present

## 2023-08-29 DIAGNOSIS — E1122 Type 2 diabetes mellitus with diabetic chronic kidney disease: Secondary | ICD-10-CM | POA: Diagnosis not present

## 2023-08-30 DIAGNOSIS — I10 Essential (primary) hypertension: Secondary | ICD-10-CM | POA: Diagnosis not present

## 2023-08-30 DIAGNOSIS — E1165 Type 2 diabetes mellitus with hyperglycemia: Secondary | ICD-10-CM | POA: Diagnosis not present

## 2023-09-03 ENCOUNTER — Other Ambulatory Visit: Payer: Self-pay

## 2023-09-03 ENCOUNTER — Other Ambulatory Visit (HOSPITAL_BASED_OUTPATIENT_CLINIC_OR_DEPARTMENT_OTHER): Payer: Self-pay

## 2023-09-03 DIAGNOSIS — K5792 Diverticulitis of intestine, part unspecified, without perforation or abscess without bleeding: Secondary | ICD-10-CM | POA: Diagnosis not present

## 2023-09-03 DIAGNOSIS — F322 Major depressive disorder, single episode, severe without psychotic features: Secondary | ICD-10-CM | POA: Diagnosis not present

## 2023-09-03 DIAGNOSIS — D649 Anemia, unspecified: Secondary | ICD-10-CM | POA: Diagnosis not present

## 2023-09-03 MED ORDER — AMOXICILLIN-POT CLAVULANATE 875-125 MG PO TABS
1.0000 | ORAL_TABLET | Freq: Two times a day (BID) | ORAL | 0 refills | Status: DC
  Filled 2023-09-03: qty 14, 7d supply, fill #0

## 2023-09-17 ENCOUNTER — Other Ambulatory Visit (HOSPITAL_BASED_OUTPATIENT_CLINIC_OR_DEPARTMENT_OTHER): Payer: Self-pay

## 2023-09-17 MED ORDER — GABAPENTIN 100 MG PO CAPS
100.0000 mg | ORAL_CAPSULE | Freq: Two times a day (BID) | ORAL | 3 refills | Status: DC
  Filled 2023-09-17: qty 180, 90d supply, fill #0
  Filled 2023-12-17: qty 180, 90d supply, fill #1
  Filled 2024-02-19 – 2024-02-25 (×2): qty 180, 90d supply, fill #2
  Filled 2024-07-05: qty 180, 90d supply, fill #3

## 2023-09-19 ENCOUNTER — Other Ambulatory Visit (HOSPITAL_BASED_OUTPATIENT_CLINIC_OR_DEPARTMENT_OTHER): Payer: Self-pay

## 2023-09-19 ENCOUNTER — Other Ambulatory Visit: Payer: Self-pay

## 2023-09-23 ENCOUNTER — Other Ambulatory Visit (HOSPITAL_BASED_OUTPATIENT_CLINIC_OR_DEPARTMENT_OTHER): Payer: Self-pay

## 2023-09-23 MED ORDER — METOPROLOL TARTRATE 50 MG PO TABS
50.0000 mg | ORAL_TABLET | Freq: Two times a day (BID) | ORAL | 3 refills | Status: DC
  Filled 2023-09-23: qty 60, 30d supply, fill #0
  Filled 2023-10-16: qty 60, 30d supply, fill #1
  Filled 2023-11-24: qty 60, 30d supply, fill #2
  Filled 2023-12-22: qty 60, 30d supply, fill #3

## 2023-09-30 DIAGNOSIS — E1165 Type 2 diabetes mellitus with hyperglycemia: Secondary | ICD-10-CM | POA: Diagnosis not present

## 2023-09-30 DIAGNOSIS — E119 Type 2 diabetes mellitus without complications: Secondary | ICD-10-CM | POA: Diagnosis not present

## 2023-09-30 DIAGNOSIS — I10 Essential (primary) hypertension: Secondary | ICD-10-CM | POA: Diagnosis not present

## 2023-10-06 ENCOUNTER — Other Ambulatory Visit: Payer: Self-pay | Admitting: Gastroenterology

## 2023-10-08 ENCOUNTER — Encounter (HOSPITAL_COMMUNITY): Payer: Self-pay | Admitting: Gastroenterology

## 2023-10-12 ENCOUNTER — Encounter (HOSPITAL_COMMUNITY): Payer: Self-pay | Admitting: Anesthesiology

## 2023-10-12 NOTE — Anesthesia Preprocedure Evaluation (Signed)
 Anesthesia Evaluation    Reviewed: Allergy & Precautions, Patient's Chart, lab work & pertinent test results  History of Anesthesia Complications (+) PONV and history of anesthetic complications  Airway        Dental   Pulmonary           Cardiovascular hypertension, Pt. on medications and Pt. on home beta blockers   08/2023 TTE  1. Left ventricular ejection fraction, by estimation, is 60 to 65%. The  left ventricle has normal function. The left ventricle has no regional  wall motion abnormalities. Left ventricular diastolic parameters are  indeterminate.   2. Right ventricular systolic function is normal. The right ventricular  size is mildly enlarged. There is mildly elevated pulmonary artery  systolic pressure. The estimated right ventricular systolic pressure is  38.0 mmHg.   3. Left atrial size was moderately dilated.   4. The mitral valve is degenerative. Trivial mitral valve regurgitation.  No evidence of mitral stenosis. Severe mitral annular calcification.   5. The aortic valve is abnormal. There is mild calcification of the  aortic valve. Aortic valve regurgitation is not visualized. Aortic valve  sclerosis/calcification is present, without any evidence of aortic  stenosis.   6. The inferior vena cava is normal in size with greater than 50%  respiratory variability, suggesting right atrial pressure of 3 mmHg.      Neuro/Psych   Anxiety Depression    CVA    GI/Hepatic   Endo/Other  diabetes, Type 2  Check GLP 1 dose  Renal/GU      Musculoskeletal  (+) Arthritis ,    Abdominal   Peds  Hematology   Anesthesia Other Findings All: See list  Reproductive/Obstetrics                              Anesthesia Physical Anesthesia Plan  ASA: 3  Anesthesia Plan: General   Post-op Pain Management: Minimal or no pain anticipated   Induction: Intravenous  PONV Risk Score and Plan:  Treatment may vary due to age or medical condition and Ondansetron   Airway Management Planned:   Additional Equipment: None  Intra-op Plan:   Post-operative Plan: Extubation in OR  Informed Consent:      Dental advisory given  Plan Discussed with: CRNA and Anesthesiologist  Anesthesia Plan Comments: (ERCP for CBD Stones)         Anesthesia Quick Evaluation

## 2023-10-12 NOTE — OR Nursing (Signed)
 Pt called by ENDO RN per Dr.Magod. Cancellation of procedure tomorrow and office will follow up to reschedule. Pt notified if she would like to contact office tomorrow afternoon to reschedule. Pt understanding and wishes her Doctor well. Collene JAYSON Edu Pittsley

## 2023-10-13 ENCOUNTER — Ambulatory Visit (HOSPITAL_COMMUNITY): Admission: RE | Admit: 2023-10-13 | Payer: PPO | Source: Ambulatory Visit | Admitting: Gastroenterology

## 2023-10-13 SURGERY — ERCP, WITH INTERVENTION IF INDICATED
Anesthesia: General

## 2023-10-15 ENCOUNTER — Encounter (HOSPITAL_COMMUNITY): Payer: Self-pay | Admitting: Gastroenterology

## 2023-10-16 ENCOUNTER — Other Ambulatory Visit: Payer: Self-pay

## 2023-10-16 ENCOUNTER — Other Ambulatory Visit (HOSPITAL_BASED_OUTPATIENT_CLINIC_OR_DEPARTMENT_OTHER): Payer: Self-pay

## 2023-10-16 ENCOUNTER — Other Ambulatory Visit: Payer: Self-pay | Admitting: Physician Assistant

## 2023-10-16 MED ORDER — ROSUVASTATIN CALCIUM 40 MG PO TABS
40.0000 mg | ORAL_TABLET | Freq: Every day | ORAL | 0 refills | Status: DC
  Filled 2023-10-16: qty 90, 90d supply, fill #0

## 2023-10-18 ENCOUNTER — Other Ambulatory Visit (HOSPITAL_BASED_OUTPATIENT_CLINIC_OR_DEPARTMENT_OTHER): Payer: Self-pay

## 2023-10-21 ENCOUNTER — Other Ambulatory Visit (HOSPITAL_BASED_OUTPATIENT_CLINIC_OR_DEPARTMENT_OTHER): Payer: Self-pay

## 2023-10-21 ENCOUNTER — Other Ambulatory Visit: Payer: Self-pay

## 2023-10-21 MED ORDER — COLESTIPOL HCL 1 G PO TABS
2.0000 g | ORAL_TABLET | Freq: Two times a day (BID) | ORAL | 12 refills | Status: DC
  Filled 2023-10-21 – 2023-11-25 (×2): qty 120, 30d supply, fill #0
  Filled 2024-01-03: qty 120, 30d supply, fill #1
  Filled 2024-02-07 (×2): qty 120, 30d supply, fill #2
  Filled 2024-03-08: qty 120, 30d supply, fill #3
  Filled 2024-04-15: qty 120, 30d supply, fill #4
  Filled 2024-05-20: qty 120, 30d supply, fill #5
  Filled 2024-06-16: qty 120, 30d supply, fill #6
  Filled 2024-07-22: qty 120, 30d supply, fill #7
  Filled 2024-08-23: qty 120, 30d supply, fill #8
  Filled 2024-09-20: qty 120, 30d supply, fill #9

## 2023-10-23 ENCOUNTER — Encounter (HOSPITAL_COMMUNITY)
Admission: RE | Disposition: A | Discharge status: Home / Self Care | Payer: Self-pay | Source: Ambulatory Visit | Attending: Gastroenterology

## 2023-10-23 ENCOUNTER — Ambulatory Visit (HOSPITAL_COMMUNITY): Payer: PPO

## 2023-10-23 ENCOUNTER — Ambulatory Visit (HOSPITAL_COMMUNITY): Payer: Self-pay | Admitting: Certified Registered"

## 2023-10-23 ENCOUNTER — Ambulatory Visit (HOSPITAL_BASED_OUTPATIENT_CLINIC_OR_DEPARTMENT_OTHER): Payer: PPO | Admitting: Certified Registered"

## 2023-10-23 ENCOUNTER — Ambulatory Visit (HOSPITAL_COMMUNITY)
Admission: RE | Admit: 2023-10-23 | Discharge: 2023-10-23 | Disposition: A | Discharge status: Home / Self Care | Payer: PPO | Source: Ambulatory Visit | Attending: Gastroenterology | Admitting: Gastroenterology

## 2023-10-23 ENCOUNTER — Other Ambulatory Visit: Payer: Self-pay

## 2023-10-23 ENCOUNTER — Encounter (HOSPITAL_COMMUNITY): Payer: Self-pay | Admitting: Gastroenterology

## 2023-10-23 DIAGNOSIS — N183 Chronic kidney disease, stage 3 unspecified: Secondary | ICD-10-CM | POA: Diagnosis not present

## 2023-10-23 DIAGNOSIS — F418 Other specified anxiety disorders: Secondary | ICD-10-CM

## 2023-10-23 DIAGNOSIS — K8050 Calculus of bile duct without cholangitis or cholecystitis without obstruction: Secondary | ICD-10-CM

## 2023-10-23 DIAGNOSIS — I129 Hypertensive chronic kidney disease with stage 1 through stage 4 chronic kidney disease, or unspecified chronic kidney disease: Secondary | ICD-10-CM | POA: Diagnosis not present

## 2023-10-23 HISTORY — PX: ENDOSCOPIC RETROGRADE CHOLANGIOPANCREATOGRAPHY (ERCP) WITH PROPOFOL: SHX5810

## 2023-10-23 HISTORY — PX: REMOVAL OF STONES: SHX5545

## 2023-10-23 HISTORY — PX: STENT REMOVAL: SHX6421

## 2023-10-23 HISTORY — PX: SPHINCTEROTOMY: SHX5279

## 2023-10-23 LAB — GLUCOSE, CAPILLARY: Glucose-Capillary: 120 mg/dL — ABNORMAL HIGH (ref 70–99)

## 2023-10-23 SURGERY — ENDOSCOPIC RETROGRADE CHOLANGIOPANCREATOGRAPHY (ERCP) WITH PROPOFOL
Anesthesia: General

## 2023-10-23 MED ORDER — CIPROFLOXACIN IN D5W 400 MG/200ML IV SOLN
INTRAVENOUS | Status: DC | PRN
Start: 2023-10-23 — End: 2023-10-23
  Administered 2023-10-23: 400 mg via INTRAVENOUS

## 2023-10-23 MED ORDER — SUGAMMADEX SODIUM 200 MG/2ML IV SOLN
INTRAVENOUS | Status: DC | PRN
  Administered 2023-10-23: 200 mg via INTRAVENOUS

## 2023-10-23 MED ORDER — SCOPOLAMINE 1 MG/3DAYS TD PT72
MEDICATED_PATCH | TRANSDERMAL | Status: AC
  Filled 2023-10-23: qty 1

## 2023-10-23 MED ORDER — ONDANSETRON HCL 4 MG/2ML IJ SOLN
INTRAMUSCULAR | Status: DC | PRN
  Administered 2023-10-23: 4 mg via INTRAVENOUS

## 2023-10-23 MED ORDER — DICLOFENAC SUPPOSITORY 100 MG
RECTAL | Status: DC | PRN
  Administered 2023-10-23: 100 mg via RECTAL

## 2023-10-23 MED ORDER — PROPOFOL 10 MG/ML IV BOLUS
INTRAVENOUS | Status: DC | PRN
  Administered 2023-10-23: 120 mg via INTRAVENOUS

## 2023-10-23 MED ORDER — DICLOFENAC SUPPOSITORY 100 MG
RECTAL | Status: AC
  Filled 2023-10-23: qty 1

## 2023-10-23 MED ORDER — EPHEDRINE SULFATE-NACL 50-0.9 MG/10ML-% IV SOSY
PREFILLED_SYRINGE | INTRAVENOUS | Status: DC | PRN
  Administered 2023-10-23 (×3): 5 mg via INTRAVENOUS
  Administered 2023-10-23: 10 mg via INTRAVENOUS

## 2023-10-23 MED ORDER — ROCURONIUM BROMIDE 10 MG/ML (PF) SYRINGE
PREFILLED_SYRINGE | INTRAVENOUS | Status: DC | PRN
  Administered 2023-10-23: 10 mg via INTRAVENOUS
  Administered 2023-10-23: 40 mg via INTRAVENOUS

## 2023-10-23 MED ORDER — LACTATED RINGERS IV SOLN: INTRAVENOUS | Status: DC | PRN

## 2023-10-23 MED ORDER — CIPROFLOXACIN IN D5W 400 MG/200ML IV SOLN
INTRAVENOUS | Status: AC
  Filled 2023-10-23: qty 200

## 2023-10-23 MED ORDER — GLUCAGON HCL RDNA (DIAGNOSTIC) 1 MG IJ SOLR
INTRAMUSCULAR | Status: AC
  Filled 2023-10-23: qty 1

## 2023-10-23 MED ORDER — FENTANYL CITRATE (PF) 100 MCG/2ML IJ SOLN
INTRAMUSCULAR | Status: DC | PRN
  Administered 2023-10-23: 50 ug via INTRAVENOUS

## 2023-10-23 MED ORDER — SODIUM CHLORIDE 0.9 % IV SOLN
INTRAVENOUS | Status: DC | PRN
  Administered 2023-10-23: 60 mL

## 2023-10-23 MED ORDER — LIDOCAINE 2% (20 MG/ML) 5 ML SYRINGE
INTRAMUSCULAR | Status: DC | PRN
  Administered 2023-10-23: 60 mg via INTRAVENOUS

## 2023-10-23 MED ORDER — PHENYLEPHRINE HCL-NACL 20-0.9 MG/250ML-% IV SOLN
INTRAVENOUS | Status: DC | PRN
  Administered 2023-10-23: 40 ug/min via INTRAVENOUS
  Administered 2023-10-23: 20 ug/min via INTRAVENOUS

## 2023-10-23 MED ORDER — FENTANYL CITRATE (PF) 100 MCG/2ML IJ SOLN
INTRAMUSCULAR | Status: AC
  Filled 2023-10-23: qty 2

## 2023-10-23 MED ORDER — PHENYLEPHRINE 80 MCG/ML (10ML) SYRINGE FOR IV PUSH (FOR BLOOD PRESSURE SUPPORT)
PREFILLED_SYRINGE | INTRAVENOUS | Status: DC | PRN
  Administered 2023-10-23: 240 ug via INTRAVENOUS

## 2023-10-23 MED ORDER — DEXAMETHASONE SODIUM PHOSPHATE 10 MG/ML IJ SOLN
INTRAMUSCULAR | Status: DC | PRN
  Administered 2023-10-23: 4 mg via INTRAVENOUS

## 2023-10-23 MED ORDER — PROPOFOL 10 MG/ML IV BOLUS
INTRAVENOUS | Status: AC
  Filled 2023-10-23: qty 20

## 2023-10-23 NOTE — Transfer of Care (Signed)
Immediate Anesthesia Transfer of Care Note  Patient: Tina Briggs  Procedure(s) Performed: ENDOSCOPIC RETROGRADE CHOLANGIOPANCREATOGRAPHY (ERCP) WITH PROPOFOL STENT REMOVAL SPHINCTEROTOMY REMOVAL OF STONES  Patient Location: PACU and Endoscopy Unit  Anesthesia Type:General  Level of Consciousness: awake, alert , and patient cooperative  Airway & Oxygen Therapy: Patient Spontanous Breathing and Patient connected to face mask oxygen  Post-op Assessment: Report given to RN and Post -op Vital signs reviewed and stable  Post vital signs: Reviewed and stable  Last Vitals:  Vitals Value Taken Time  BP 143/52 10/23/23 0850  Temp    Pulse 67 10/23/23 0852  Resp 17 10/23/23 0852  SpO2 100 % 10/23/23 0852  Vitals shown include unfiled device data.  Last Pain:  Vitals:   10/23/23 0704  TempSrc: Tympanic  PainSc: 0-No pain         Complications: No notable events documented.

## 2023-10-23 NOTE — Anesthesia Preprocedure Evaluation (Signed)
Anesthesia Evaluation  Patient identified by MRN, date of birth, ID band Patient awake    Reviewed: Allergy & Precautions, NPO status , Patient's Chart, lab work & pertinent test results  History of Anesthesia Complications (+) PONV and history of anesthetic complications  Airway Mallampati: II  TM Distance: >3 FB Neck ROM: Full    Dental   Pulmonary    breath sounds clear to auscultation       Cardiovascular hypertension, Pt. on medications and Pt. on home beta blockers  Rhythm:Regular Rate:Normal  08/2023 TTE  1. Left ventricular ejection fraction, by estimation, is 60 to 65%. The  left ventricle has normal function. The left ventricle has no regional  wall motion abnormalities. Left ventricular diastolic parameters are  indeterminate.   2. Right ventricular systolic function is normal. The right ventricular  size is mildly enlarged. There is mildly elevated pulmonary artery  systolic pressure. The estimated right ventricular systolic pressure is  38.0 mmHg.   3. Left atrial size was moderately dilated.   4. The mitral valve is degenerative. Trivial mitral valve regurgitation.  No evidence of mitral stenosis. Severe mitral annular calcification.   5. The aortic valve is abnormal. There is mild calcification of the  aortic valve. Aortic valve regurgitation is not visualized. Aortic valve  sclerosis/calcification is present, without any evidence of aortic  stenosis.   6. The inferior vena cava is normal in size with greater than 50%  respiratory variability, suggesting right atrial pressure of 3 mmHg.      Neuro/Psych   Anxiety Depression    CVA    GI/Hepatic   Endo/Other  diabetes, Type 2  Check GLP 1 dose  Renal/GU      Musculoskeletal  (+) Arthritis ,    Abdominal   Peds  Hematology   Anesthesia Other Findings All: See list  Reproductive/Obstetrics                              Anesthesia Physical Anesthesia Plan  ASA: 3  Anesthesia Plan: General   Post-op Pain Management: Minimal or no pain anticipated   Induction: Intravenous  PONV Risk Score and Plan: Treatment may vary due to age or medical condition and Ondansetron  Airway Management Planned:   Additional Equipment: None  Intra-op Plan:   Post-operative Plan: Extubation in OR  Informed Consent:      Dental advisory given  Plan Discussed with: CRNA and Anesthesiologist  Anesthesia Plan Comments: (ERCP for CBD Stones)        Anesthesia Quick Evaluation

## 2023-10-23 NOTE — Anesthesia Procedure Notes (Signed)
Procedure Name: Intubation Date/Time: 10/23/2023 7:44 AM  Performed by: Sindy Guadeloupe, CRNAPre-anesthesia Checklist: Patient identified, Emergency Drugs available, Suction available, Patient being monitored and Timeout performed Patient Re-evaluated:Patient Re-evaluated prior to induction Oxygen Delivery Method: Circle system utilized Preoxygenation: Pre-oxygenation with 100% oxygen Induction Type: IV induction Ventilation: Mask ventilation without difficulty Laryngoscope Size: Mac and 4 Grade View: Grade I Tube type: Oral Tube size: 7.0 mm Number of attempts: 1 Airway Equipment and Method: Stylet Placement Confirmation: ETT inserted through vocal cords under direct vision, positive ETCO2 and breath sounds checked- equal and bilateral Secured at: 21 cm Tube secured with: Tape Dental Injury: Teeth and Oropharynx as per pre-operative assessment

## 2023-10-23 NOTE — Discharge Instructions (Addendum)
Call if question or problem otherwise follow-up in the office in roughly 1 month to consider Actigall or Urso use and have clear liquids only until 3 PM and if doing well later today may have soft solidsYOU HAD AN ENDOSCOPIC PROCEDURE TODAY: Refer to the procedure report and other information in the discharge instructions given to you for any specific questions about what was found during the examination. If this information does not answer your questions, please call [facility] office at [phone] to clarify.   YOU SHOULD EXPECT: Some feelings of bloating in the abdomen. Passage of more gas than usual. Walking can help get rid of the air that was put into your GI tract during the procedure and reduce the bloating. If you had a lower endoscopy (such as a colonoscopy or flexible sigmoidoscopy) you may notice spotting of blood in your stool or on the toilet paper. Some abdominal soreness may be present for a day or two, also.  DIET: Your first meal following the procedure should be a light meal and then it is ok to progress to your normal diet. A half-sandwich or bowl of soup is an example of a good first meal. Heavy or fried foods are harder to digest and may make you feel nauseous or bloated. Drink plenty of fluids but you should avoid alcoholic beverages for 24 hours. If you had a esophageal dilation, please see attached instructions for diet.    ACTIVITY: Your care partner should take you home directly after the procedure. You should plan to take it easy, moving slowly for the rest of the day. You can resume normal activity the day after the procedure however YOU SHOULD NOT DRIVE, use power tools, machinery or perform tasks that involve climbing or major physical exertion for 24 hours (because of the sedation medicines used during the test).   SYMPTOMS TO REPORT IMMEDIATELY: A gastroenterologist can be reached at any hour. Please call [phone]  for any of the following symptoms:  Following lower  endoscopy (colonoscopy, flexible sigmoidoscopy) Excessive amounts of blood in the stool  Significant tenderness, worsening of abdominal pains  Swelling of the abdomen that is new, acute  Fever of 100 or higher  Following upper endoscopy (EGD, EUS, ERCP, esophageal dilation) Vomiting of blood or coffee ground material  New, significant abdominal pain  New, significant chest pain or pain under the shoulder blades  Painful or persistently difficult swallowing  New shortness of breath  Black, tarry-looking or red, bloody stools  FOLLOW UP:  If any biopsies were taken you will be contacted by phone or by letter within the next 1-3 weeks. Call [phone]  if you have not heard about the biopsies in 3 weeks.  Please also call with any specific questions about appointments or follow up tests.

## 2023-10-23 NOTE — Progress Notes (Signed)
Napoleon Form 7:37 AM  Subjective: Patient asymptomatic since we saw her recently in the office and we answered all her questions and we discussed the procedure  Objective: Vital signs stable afebrile no acute distress exam please see preassessment evaluation  Assessment: Recurrent CBD stones abnormal MRI  Plan: Okay to proceed with ERCP stent removal and residual stone extraction with anesthesia assistance  [facility]  office [phone] After 5PM or if no answer call [phone]

## 2023-10-23 NOTE — Op Note (Signed)
[facility] Patient Name: Tina Briggs Procedure Date: 10/23/2023 MRN: [id number] Attending MD: Vida Rigger , MD, [id number] Date of Birth: Jan 28, 1946 CSN: [id number] Age: 78 Admit Type: Outpatient Procedure:                ERCP Indications:              Abnormal abdominal MRI, Follow-up of bile duct                            stone(s), Biliary stent removal Providers:                Vida Rigger, MD, Stephens Shire RN, RN, Rozetta Nunnery, Technician Referring MD:              Medicines:                General Anesthesia Complications:            No immediate complications. Estimated Blood Loss:     Estimated blood loss: none. Procedure:                Pre-Anesthesia Assessment:                           - Prior to the procedure, a History and Physical                            was performed, and patient medications and                            allergies were reviewed. The patient's tolerance of                            previous anesthesia was also reviewed. The risks                            and benefits of the procedure and the sedation                            options and risks were discussed with the patient.                            All questions were answered, and informed consent                            was obtained. Prior Anticoagulants: The patient has                            taken no anticoagulant or antiplatelet agents. ASA                            Grade Assessment: III - A patient with severe  systemic disease. After reviewing the risks and                            benefits, the patient was deemed in satisfactory                            condition to undergo the procedure.                           After obtaining informed consent, the scope was                            passed under direct vision. Throughout the                            procedure, the patient's blood  pressure, pulse, and                            oxygen saturations were monitored continuously. The                            [id number]-[id number] ([id number] Olympus duodenoscope was                            introduced through the mouth, and used to inject                            contrast into and used to cannulate the bile duct.                            The ERCP was accomplished without difficulty. The                            patient tolerated the procedure well. Scope In: Scope Out: Findings:      One 10 Fr by 7 cm plastic biliary stent with a single external flap and       a single internal flap that was previously placed 6.5 cm into the common       bile duct. The stent was in good position. Deep selective cannulation       was readily obtained on the first attempt next to the stent and biliary       sphincterotomy was made with a Hydratome sphincterotome using ERBE       electrocautery. There was no post-sphincterotomy bleeding. One stent was       removed from the biliary tree using a rat-toothed forceps and mini snare       and sent for cytology. Initially we tried to snare it with the mini       snare but a piece of the stent broke off and was recovered and then we       grabbed it with the rat-tooth forceps and it was completely recovered       and deep selective cannulation again was readily obtained and the       biliary sphincterotomy was extended with a Hydratome sphincterotome       using ERBE electrocautery.  There was no post-sphincterotomy bleeding. We       extended the sphincterotomy until we had adequate biliary drainage in       fact a few small stone particles fell out of the duct and we could get       the fully bowed sphincterotome easily in and out of the duct and to       discover objects, the biliary tree was swept with an adjustable 12- 15       mm balloon starting at the bifurcation, left main hepatic duct and right       main hepatic duct. Sludge was swept from  the duct. All stones were       removed. Nothing was found on multiple subsequent balloon pull-through's       using both size balloons and 12 which passed readily through the patent       sphincterotomy site and the 15 would be withdrawn with gentle pressure       and was no pancreatic duct injection or wire advancement throughout the       procedure and on subsequent multiple balloon occlusion cholangiograms no       dominant strictures were seen and no increased obvious dilation of the       left system previously seen on MRI with some concerns although not       confirmed on her last 2 ERCPs. The wire balloon and scope were removed       and the patient tolerated the procedure well and not mentioned above her       ampulla was within a diverticulum and other duodenal diverticulum were       seen Impression:               - Choledocholithiasis was found. Complete removal                            was accomplished by biliary sphincterotomy and                            balloon extraction.                           - One plastic biliary stent was previously placed                            into the common bile duct.                           - A biliary sphincterotomy was performed.                           - One stent was removed from the biliary tree.                           - A biliary sphincterotomy was then extended and                            performed.                           - The biliary  tree was swept and nothing was found                            at the end of the procedure. Moderate Sedation:      Not Applicable - Patient had care per Anesthesia. Recommendation:           - Clear liquid diet for 6 hours. If doing well at 3                            PM may have soft solids                           - Continue present medications.                           - Return to GI clinic in 4 weeks. Will consider                            Urso use as an outpatient                            - Telephone GI clinic if symptomatic PRN. Procedure Code(s):        --- Professional ---                           [phone], Endoscopic retrograde                            cholangiopancreatography (ERCP); with removal and                            exchange of stent(s), biliary or pancreatic duct,                            including pre- and post-dilation and guide wire                            passage, when performed, including sphincterotomy,                            when performed, each stent exchanged                           [id number], Endoscopic retrograde                            cholangiopancreatography (ERCP); with removal of                            calculi/debris from biliary/pancreatic duct(s) Diagnosis Code(s):        --- Professional ---                           K80.50, Calculus of bile duct without cholangitis  or cholecystitis without obstruction                           Z46.59, Encounter for fitting and adjustment of                            other gastrointestinal appliance and device CPT copyright 2022 [employer]. All rights reserved. The codes documented in this report are preliminary and upon coder review may  be revised to meet current compliance requirements. Vida Rigger, MD 10/23/2023 8:46:48 AM This report has been signed electronically. Number of Addenda: 0

## 2023-10-27 ENCOUNTER — Encounter (HOSPITAL_COMMUNITY): Payer: Self-pay | Admitting: Gastroenterology

## 2023-10-27 NOTE — Anesthesia Postprocedure Evaluation (Signed)
Anesthesia Post Note  Patient: Tina Briggs  Procedure(s) Performed: ENDOSCOPIC RETROGRADE CHOLANGIOPANCREATOGRAPHY (ERCP) WITH PROPOFOL STENT REMOVAL SPHINCTEROTOMY REMOVAL OF STONES     Patient location during evaluation: PACU Anesthesia Type: General Level of consciousness: awake and alert Pain management: pain level controlled Vital Signs Assessment: post-procedure vital signs reviewed and stable Respiratory status: spontaneous breathing, nonlabored ventilation, respiratory function stable and patient connected to nasal cannula oxygen Cardiovascular status: blood pressure returned to baseline and stable Postop Assessment: no apparent nausea or vomiting Anesthetic complications: no   No notable events documented.  Last Vitals:  Vitals:   10/23/23 0920 10/23/23 0926  BP: 121/63 137/63  Pulse: 67 61  Resp: 16 14  Temp:    SpO2: 94% 98%    Last Pain:  Vitals:   10/24/23 1528  TempSrc:   PainSc: 0-No pain                 Kennieth Rad

## 2023-10-28 LAB — CYTOLOGY - NON PAP

## 2023-10-31 ENCOUNTER — Other Ambulatory Visit (HOSPITAL_BASED_OUTPATIENT_CLINIC_OR_DEPARTMENT_OTHER): Payer: Self-pay

## 2023-11-25 ENCOUNTER — Other Ambulatory Visit (HOSPITAL_BASED_OUTPATIENT_CLINIC_OR_DEPARTMENT_OTHER): Payer: Self-pay

## 2023-11-25 ENCOUNTER — Other Ambulatory Visit: Payer: Self-pay

## 2023-12-03 ENCOUNTER — Other Ambulatory Visit (HOSPITAL_BASED_OUTPATIENT_CLINIC_OR_DEPARTMENT_OTHER): Payer: Self-pay

## 2023-12-03 MED ORDER — URSODIOL 300 MG PO CAPS
300.0000 mg | ORAL_CAPSULE | Freq: Two times a day (BID) | ORAL | 3 refills | Status: AC
  Filled 2023-12-03: qty 60, 30d supply, fill #0

## 2023-12-03 NOTE — Progress Notes (Signed)
 [facility] - Clinic Note  12/15/2023     CHIEF COMPLAINT Patient presents for Retina Follow Up    HISTORY OF PRESENT ILLNESS: Tina Briggs is a 78 y.o. female who presents to the clinic today for:  HPI     Retina Follow Up   Patient presents with  Dry AMD.  In both eyes.  This started 10 months ago.  I, the attending physician,  performed the HPI with the patient and updated documentation appropriately.        Comments   Patient here for 10 months retina follow up for non exu ARMD OU. Patient states vision the same. Still has trouble with OD. Not able to see as clear. Not a new problem. No eye pain. Was very very sick in Nov Dec and Jan. Had septic shock for 7 days. Has low blood plalets. Closes OD to read.       Last edited by Rennis Chris, MD on 12/17/2023  8:32 PM.     Pt states she had sepsis since she was here last, she states she was close to dying, she has gallstones that get stuck in the bile duct and get infected, she feels like her vision is good, she takes AREDS 2 and checks her vision on an amsler grid, she closes her right eye to read bc she has a hard time focusing   Referring physician: [name], MD [address] [place],  [place] [address]  HISTORICAL INFORMATION:   Selected notes from the MEDICAL RECORD NUMBER Referred by Dr. Dagoberto Ligas to eval AMD OD LEE: 10.18.21 Ocular Hx- AMD OD   CURRENT MEDICATIONS: No current outpatient medications on file. (Ophthalmic Drugs)   No current facility-administered medications for this visit. (Ophthalmic Drugs)   Current Outpatient Medications (Other)  Medication Sig   acetaminophen (TYLENOL) 325 MG tablet Take 2 tablets (650 mg total) by mouth every 6 (six) hours as needed for mild pain (pain score 1-3) or fever.   cholecalciferol (VITAMIN D) 1000 units tablet Take 5,000 Units by mouth daily.   colestipol (COLESTID) 1 g tablet Take 2 tablets (2 g total) by mouth in  the morning and at bedtime.   CRESTOR 40 MG tablet TAKE 1 TABLET AT BEDTIME (Patient taking differently: Take 40 mg by mouth at bedtime.)   dapagliflozin propanediol (FARXIGA) 5 MG TABS tablet Take 5 mg by mouth daily.   desloratadine (CLARINEX) 5 MG tablet Take 1 tablet (5 mg total) by mouth daily.   diclofenac Sodium (VOLTAREN) 1 % GEL Apply 4 g topically 4 (four) times daily.   fluticasone (FLONASE) 50 MCG/ACT nasal spray Place 2 sprays into both nostrils daily as needed for allergies.    FREESTYLE LITE test strip USE 1 STRIP TO CHECK GLUCOSE 4 TIMES DAILY   gabapentin (NEURONTIN) 100 MG capsule Take 1 capsule (100 mg total) by mouth 2 (two) times daily.   metoprolol tartrate (LOPRESSOR) 50 MG tablet Take 1 tablet (50 mg total) by mouth 2 (two) times daily. Take with food.   Multiple Vitamins-Minerals (PRESERVISION AREDS 2 PO) Take 1 tablet by mouth 2 (two) times daily.   NOVOLOG FLEXPEN RELION 100 UNIT/ML FlexPen Inject 8-10 Units into the skin 3 (three) times daily as needed. BS   rosuvastatin (CRESTOR) 40 MG tablet Take 1 tablet (40 mg total) by mouth daily.   ursodiol (ACTIGALL) 300 MG capsule Take 1 capsule (300 mg total) by mouth in the morning and at  bedtime.   venlafaxine XR (EFFEXOR-XR) 75 MG 24 hr capsule Take 75 mg by mouth every morning.    vitamin B-12 (CYANOCOBALAMIN) 1000 MCG tablet Take 1,000 mcg by mouth 3 (three) times a week. Take on Sundays, Wednesdays and Fridays.   amLODipine (NORVASC) 5 MG tablet Take 1 tablet (5 mg total) by mouth daily.   amoxicillin-clavulanate (AUGMENTIN) 875-125 MG tablet Take 1 tablet by mouth every 12 (twelve) hours for 7 days.   Semaglutide,0.25 or 0.5MG /DOS, (OZEMPIC, 0.25 OR 0.5 MG/DOSE,) 2 MG/3ML SOPN Inject 0.5 mg into the skin once a week. (Patient not taking: Reported on 12/15/2023)   No current facility-administered medications for this visit. (Other)   REVIEW OF SYSTEMS: ROS   Positive for: Gastrointestinal, Neurological, Genitourinary,  Musculoskeletal, Endocrine, Cardiovascular, Eyes Negative for: Constitutional, Skin, HENT, Respiratory, Psychiatric, Allergic/Imm, Heme/Lymph Last edited by Laddie Aquas, COA on 12/15/2023  1:06 PM.     ALLERGIES Allergies  Allergen Reactions   Codeine Shortness Of Breath   Clavulanic Acid     Pt tolerated Zosyn 11/6   Flagyl [Metronidazole]    Other Other (See Comments)   Prevacid [Lansoprazole]    Tetracyclines & Related Itching   Trulicity [Dulaglutide]    Azithromycin Itching and Rash   Erythromycin Itching and Rash   Morphine And Codeine Itching and Rash   Septra [Sulfamethoxazole-Trimethoprim] Itching and Rash   PAST MEDICAL HISTORY 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 Mild to mod TI scarring. (Dr. Eliott Nine)   HNP (herniated nucleus pulposus), lumbar    Recurrent   HYPERLIPIDEMIA-MIXED    HYPERTENSION, UNSPECIFIED    Hypertensive retinopathy    Injury of right hip    Lumbar back pain    Macular degeneration    Microscopic hematuria    Neuropathy    OBESITY    Plantar fasciitis    PONV (postoperative nausea and vomiting)    past history only, "sometimes they put a patch on me"   Right eye injury    Past Surgical History:  Procedure Laterality Date   ABDOMINAL HYSTERECTOMY     APPENDECTOMY     BACK SURGERY     lumbar '09"microdiscectomy"   BILIARY STENT PLACEMENT  08/11/2023   Procedure: BILIARY STENT PLACEMENT;  Surgeon: Vida Rigger, MD;  Location: [facility] ENDOSCOPY;  Service: Gastroenterology;;  10x7   BREAST BIOPSY     BREAST  BIOPSY Right 05/19/2023   MM RT BREAST BX W LOC DEV EA AD LESION IMG BX SPEC STEREO GUIDE 05/19/2023 [facility]   BREAST BIOPSY Right 05/19/2023   MM RT BREAST BX W LOC DEV 1ST LESION IMAGE BX SPEC STEREO GUIDE 05/19/2023 [facility]   breat Bx benign     CHOLECYSTECTOMY     open   COLONOSCOPY WITH PROPOFOL N/A 11/22/2014   Procedure: COLONOSCOPY WITH PROPOFOL;  Surgeon: Charolett Bumpers, MD;  Location: [facility] ENDOSCOPY;  Service: Endoscopy;  Laterality: N/A;   ENDOSCOPIC RETROGRADE CHOLANGIOPANCREATOGRAPHY (ERCP) WITH PROPOFOL  N/A 10/23/2023   Procedure: ENDOSCOPIC RETROGRADE CHOLANGIOPANCREATOGRAPHY (ERCP) WITH PROPOFOL;  Surgeon: Vida Rigger, MD;  Location: [facility] ENDOSCOPY;  Service: Gastroenterology;  Laterality: N/A;   ERCP N/A 05/26/2015   Procedure: ENDOSCOPIC RETROGRADE CHOLANGIOPANCREATOGRAPHY (ERCP)   (DOING CASE IN MAIN OR);  Surgeon: Dorena Cookey, MD;  Location: [facility] ENDOSCOPY;  Service: Gastroenterology;  Laterality: N/A;   ERCP N/A 06/06/2015   Procedure: ENDOSCOPIC RETROGRADE CHOLANGIOPANCREATOGRAPHY (ERCP);  Surgeon: Vida Rigger, MD;  Location: [facility] ENDOSCOPY;  Service: Endoscopy;  Laterality: N/A;   ERCP N/A 01/12/2021   Procedure: ENDOSCOPIC RETROGRADE CHOLANGIOPANCREATOGRAPHY (ERCP);  Surgeon: Vida Rigger, MD;  Location: [facility] ENDOSCOPY;  Service: Endoscopy;  Laterality: N/A;   ERCP N/A 08/11/2023   Procedure: ENDOSCOPIC RETROGRADE CHOLANGIOPANCREATOGRAPHY (ERCP);  Surgeon: Vida Rigger, MD;  Location[facility] ENDOSCOPY;  Service: Gastroenterology;  Laterality: N/A;   ESOPHAGOGASTRODUODENOSCOPY (EGD) WITH PROPOFOL N/A 11/22/2014   Procedure: ESOPHAGOGASTRODUODENOSCOPY (EGD) WITH PROPOFOL;  Surgeon: Charolett Bumpers, MD;  Location: [facility] ENDOSCOPY;  Service: Endoscopy;  Laterality: N/A;   EYE SURGERY Bilateral    cataracts   GANGLION CYST EXCISION Right    LIPOMA EXCISION  1993   From thigh 1993   LUMBAR FUSION  09/16/2018   LUMBAR LAMINECTOMY/DECOMPRESSION MICRODISCECTOMY Left  12/31/2017   Procedure: Laminectomy and Foraminotomy - Lumbar Three-Four - left Extraforaminal diskectomy Lumbar Four-Five left;  Surgeon: Tia Alert, MD;  Location: [facility] OR;  Service: Neurosurgery;  Laterality: Left;   PERONEAL NERVE DECOMPRESSION Left 12/31/2017   Procedure: Left PERONEAL NERVE DECOMPRESSION;  Surgeon: Tia Alert, MD;  Location: [facility] OR;  Service: Neurosurgery;  Laterality: Left;  left   PERONEAL NERVE DECOMPRESSION     REMOVAL OF STONES  01/12/2021   Procedure: REMOVAL OF STONES;  Surgeon: Vida Rigger, MD;  Location: [facility] ENDOSCOPY;  Service: Endoscopy;;   REMOVAL OF STONES  08/11/2023   Procedure: REMOVAL OF STONES;  Surgeon: Vida Rigger, MD;  Location: [facility] ENDOSCOPY;  Service: Gastroenterology;;   REMOVAL OF STONES  10/23/2023   Procedure: REMOVAL OF STONES;  Surgeon: Vida Rigger, MD;  Location: [facility] ENDOSCOPY;  Service: Gastroenterology;;   Right ganglion cyst     Right ganglion cyst remove     SPHINCTEROTOMY  08/11/2023   Procedure: SPHINCTEROTOMY;  Surgeon: Vida Rigger, MD;  Location: [facility] ENDOSCOPY;  Service: Gastroenterology;;   Dennison Mascot  10/23/2023   Procedure: Dennison Mascot;  Surgeon: Vida Rigger, MD;  Location: [facility] ENDOSCOPY;  Service: Gastroenterology;;   Francine Graven REMOVAL  10/23/2023   Procedure: STENT REMOVAL;  Surgeon: Vida Rigger, MD;  Location: [facility] ENDOSCOPY;  Service: Gastroenterology;;   TUBAL LIGATION     FAMILY HISTORY Family History  Problem Relation Age of Onset   Cancer Maternal Aunt        breast cancer   Cancer Maternal Grandmother        kidney cancer    Cancer Paternal Grandmother        bone cancer    Heart disease Mother    Alzheimer's disease Father    Tremor Father        possible PD   Diabetes Mellitus II Brother    Healthy Son    Healthy Son    SOCIAL HISTORY Social History   Tobacco Use   Smoking status: Never   Smokeless tobacco: Never  Vaping Use   Vaping status: Never Used  Substance Use Topics   Alcohol use: No    Drug use: No       OPHTHALMIC EXAM:  Base Eye Exam  Visual Acuity (Snellen - Linear)       Right Left   Dist cc 20/20 -1 20/20 -1    Correction: Glasses         Tonometry (Tonopen, 1:02 PM)       Right Left   Pressure 11 10         Pupils       Dark Light Shape React APD   Right 3 2 Round Brisk None   Left 3 2 Round Brisk None         Visual Fields (Counting fingers)       Left Right    Full Full         Extraocular Movement       Right Left    Full, Ortho Full, Ortho         Neuro/Psych     Oriented x3: Yes   Mood/Affect: Normal         Dilation     Both eyes: 1.0% Mydriacyl, 2.5% Phenylephrine @ 1:02 PM           Slit Lamp and Fundus Exam     Slit Lamp Exam       Right Left   Lids/Lashes Dermatochalasis - upper lid Dermatochalasis - upper lid   Conjunctiva/Sclera White and quiet temporal pinguecula   Cornea Mild arcus, trace Punctate epithelial erosions, tear film debris, well healed temporal cataract wounds Mild arcus, trace, fine Punctate epithelial erosions, tear film debris, well healed temporal cataract wounds   Anterior Chamber deep and clear deep and clear   Iris Round and dilated, No NVI Round and moderately dilated, No NVI   Lens PC IOL in good position with open PC PC IOL in good position   Anterior Vitreous Vitreous syneresis, Posterior vitreous detachment Mild Vitreous syneresis, Posterior vitreous detachment, vitreous condensations         Fundus Exam       Right Left   Disc Pink and Sharp, Compact Pink and Sharp, Compact   C/D Ratio 0.5 0.4   Macula Flat, Blunted foveal reflex, Drusen, RPE mottling and clumping, focal pigment clump IN fovea -- stable, No heme or edema Flat, Blunted foveal reflex, Drusen, RPE mottling and clumping, focal pigment clump nasal fovea, No heme or edema   Vessels attenuated, mild tortuosity attenuated, mild tortuosity   Periphery Attached, reticular degeneration greatest nasally,  atrophic pigmented CR scar at 0730, No heme Attached, reticular degeneration greatest nasally, No heme           Refraction     Wearing Rx       Sphere Cylinder Axis Add   Right -0.50 +0.75 116 +2.50   Left +0.25 +1.00 160 +2.50    Type: prog            IMAGING AND PROCEDURES  Imaging and Procedures for 12/15/2023  OCT, Retina - OU - Both Eyes       Right Eye Quality was good. Central Foveal Thickness: 304. Progression has improved. Findings include normal foveal contour, no IRF, no SRF, retinal drusen , intraretinal hyper-reflective material, pigment epithelial detachment (Focal RPE irregularity and IRHM IN fovea -- slightly improved, no fluid).   Left Eye Quality was good. Central Foveal Thickness: 296. Progression has improved. Findings include normal foveal contour, no IRF, no SRF, retinal drusen , intraretinal hyper-reflective material (Focal RPE irregularity and IRHM nasal fovea -- slightly improved -- no fluid).   Notes *Images captured and stored on drive  Diagnosis / Impression:  Non-exu ARMD OU with drusen and focal RPE irregularity and IRHM IN fovea -- slightly improved, no fluid -- no significant change from prior  Clinical management:  See below  Abbreviations: NFP - Normal foveal profile. CME - cystoid macular edema. PED - pigment epithelial detachment. IRF - intraretinal fluid. SRF - subretinal fluid. EZ - ellipsoid zone. ERM - epiretinal membrane. ORA - outer retinal atrophy. ORT - outer retinal tubulation. SRHM - subretinal hyper-reflective material. IRHM - intraretinal hyper-reflective material            ASSESSMENT/PLAN:    ICD-10-CM   1. Intermediate stage nonexudative age-related macular degeneration of both eyes  H35.3132 OCT, Retina - OU - Both Eyes    2. Essential hypertension  I10     3. Hypertensive retinopathy of both eyes  H35.033     4. Diabetes mellitus type 2 without retinopathy (HCC)  E11.9     5. Current use of insulin  (HCC)  Z79.4     6. Long term (current) use of oral hypoglycemic drugs  Z79.84     7. Long-term (current) use of injectable non-insulin antidiabetic drugs  Z79.85     8. Pseudophakia of both eyes  Z96.1      1,2. Age related macular degeneration, non-exudative, OU  - intermediate stage w/ drusen and focal RPE disruptions OU -- stable  - The incidence, anatomy, and pathology of dry AMD, risk of progression, and the AREDS and AREDS 2 studies including smoking risks discussed with patient.  - started on AREDS 2 supplements and Amsler Grid monitoring by Dr. Dagoberto Ligas -- continue  - BCVA stable at 20/25 OD, 20/20 OS  - OCT without significant change or exudative disease OU  - f/u 9-12 months, DFE, OCT  3,4. Hypertensive retinopathy OU - discussed importance of tight BP control - monitor  5-7. Diabetes mellitus, type 2 without retinopathy  - A1c was 6.8 on 2.15.22 - The incidence, risk factors for progression, natural history and treatment options for diabetic retinopathy  were discussed with patient.   - The need for close monitoring of blood glucose, blood pressure, and serum lipids, avoiding cigarette or any type of tobacco, and the need for long term follow up was also discussed with patient. - f/u in 1 year, sooner prn  8. Pseudophakia OU  - s/p CE/IOL OU  - IOL in good position, doing well  - monitor  Ophthalmic Meds Ordered this visit:  No orders of the defined types were placed in this encounter.    Return for f/u 9-12 months, non-exu ARMD OU, DFE, OCT.  There are no Patient Instructions on file for this visit.  This document serves as a record of services personally performed by Karie Chimera, MD, PhD. It was created on their behalf by Glee Arvin. Manson Passey, OA an ophthalmic technician. The creation of this record is the provider's dictation and/or activities during the visit.    Electronically signed by: Glee Arvin. Manson Passey, OA 12/17/23 8:35 PM   Karie Chimera, M.D.,  Ph.D. Diseases & Surgery of the Retina and Vitreous [facility]  I have reviewed the above documentation for accuracy and completeness, and I agree with the above. Karie Chimera, M.D., Ph.D. 12/17/23 8:40 PM    Abbreviations: M myopia (nearsighted); A astigmatism; H hyperopia (farsighted); P presbyopia; Mrx spectacle prescription;  CTL contact lenses; OD right eye; OS left eye; OU both eyes  XT exotropia; ET esotropia; PEK  punctate epithelial keratitis; PEE punctate epithelial erosions; DES dry eye syndrome; MGD meibomian gland dysfunction; ATs artificial tears; PFAT's preservative free artificial tears; NSC nuclear sclerotic cataract; PSC posterior subcapsular cataract; ERM epi-retinal membrane; PVD posterior vitreous detachment; RD retinal detachment; DM diabetes mellitus; DR diabetic retinopathy; NPDR non-proliferative diabetic retinopathy; PDR proliferative diabetic retinopathy; CSME clinically significant macular edema; DME diabetic macular edema; dbh dot blot hemorrhages; CWS cotton wool spot; POAG primary open angle glaucoma; C/D cup-to-disc ratio; HVF humphrey visual field; GVF goldmann visual field; OCT optical coherence tomography; IOP intraocular pressure; BRVO Branch retinal vein occlusion; CRVO central retinal vein occlusion; CRAO central retinal artery occlusion; BRAO branch retinal artery occlusion; RT retinal tear; SB scleral buckle; PPV pars plana vitrectomy; VH Vitreous hemorrhage; PRP panretinal laser photocoagulation; IVK intravitreal kenalog; VMT vitreomacular traction; MH Macular hole;  NVD neovascularization of the disc; NVE neovascularization elsewhere; AREDS age related eye disease study; ARMD age related macular degeneration; POAG primary open angle glaucoma; EBMD epithelial/anterior basement membrane dystrophy; ACIOL anterior chamber intraocular lens; IOL intraocular lens; PCIOL posterior chamber intraocular lens; Phaco/IOL phacoemulsification with  intraocular lens placement; PRK photorefractive keratectomy; LASIK laser assisted in situ keratomileusis; HTN hypertension; DM diabetes mellitus; COPD chronic obstructive pulmonary disease

## 2023-12-10 DIAGNOSIS — E782 Mixed hyperlipidemia: Secondary | ICD-10-CM | POA: Diagnosis not present

## 2023-12-10 DIAGNOSIS — N2581 Secondary hyperparathyroidism of renal origin: Secondary | ICD-10-CM | POA: Diagnosis not present

## 2023-12-10 DIAGNOSIS — Z8639 Personal history of other endocrine, nutritional and metabolic disease: Secondary | ICD-10-CM | POA: Diagnosis not present

## 2023-12-10 DIAGNOSIS — I700 Atherosclerosis of aorta: Secondary | ICD-10-CM | POA: Diagnosis not present

## 2023-12-10 DIAGNOSIS — Z794 Long term (current) use of insulin: Secondary | ICD-10-CM | POA: Diagnosis not present

## 2023-12-10 DIAGNOSIS — I10 Essential (primary) hypertension: Secondary | ICD-10-CM | POA: Diagnosis not present

## 2023-12-10 DIAGNOSIS — N052 Unspecified nephritic syndrome with diffuse membranous glomerulonephritis: Secondary | ICD-10-CM | POA: Diagnosis not present

## 2023-12-10 DIAGNOSIS — Z23 Encounter for immunization: Secondary | ICD-10-CM | POA: Diagnosis not present

## 2023-12-10 DIAGNOSIS — F331 Major depressive disorder, recurrent, moderate: Secondary | ICD-10-CM | POA: Diagnosis not present

## 2023-12-10 DIAGNOSIS — I519 Heart disease, unspecified: Secondary | ICD-10-CM | POA: Diagnosis not present

## 2023-12-10 DIAGNOSIS — N1831 Chronic kidney disease, stage 3a: Secondary | ICD-10-CM | POA: Diagnosis not present

## 2023-12-10 DIAGNOSIS — E1122 Type 2 diabetes mellitus with diabetic chronic kidney disease: Secondary | ICD-10-CM | POA: Diagnosis not present

## 2023-12-10 DIAGNOSIS — I2510 Atherosclerotic heart disease of native coronary artery without angina pectoris: Secondary | ICD-10-CM | POA: Diagnosis not present

## 2023-12-15 ENCOUNTER — Encounter (INDEPENDENT_AMBULATORY_CARE_PROVIDER_SITE_OTHER): Payer: Self-pay | Admitting: Ophthalmology

## 2023-12-15 ENCOUNTER — Ambulatory Visit (INDEPENDENT_AMBULATORY_CARE_PROVIDER_SITE_OTHER): Payer: PPO | Admitting: Ophthalmology

## 2023-12-15 DIAGNOSIS — Z7985 Long-term (current) use of injectable non-insulin antidiabetic drugs: Secondary | ICD-10-CM | POA: Diagnosis not present

## 2023-12-15 DIAGNOSIS — Z961 Presence of intraocular lens: Secondary | ICD-10-CM

## 2023-12-15 DIAGNOSIS — I10 Essential (primary) hypertension: Secondary | ICD-10-CM

## 2023-12-15 DIAGNOSIS — H353132 Nonexudative age-related macular degeneration, bilateral, intermediate dry stage: Secondary | ICD-10-CM

## 2023-12-15 DIAGNOSIS — H35033 Hypertensive retinopathy, bilateral: Secondary | ICD-10-CM | POA: Diagnosis not present

## 2023-12-15 DIAGNOSIS — E119 Type 2 diabetes mellitus without complications: Secondary | ICD-10-CM | POA: Diagnosis not present

## 2023-12-15 DIAGNOSIS — Z7984 Long term (current) use of oral hypoglycemic drugs: Secondary | ICD-10-CM | POA: Diagnosis not present

## 2023-12-15 DIAGNOSIS — Z794 Long term (current) use of insulin: Secondary | ICD-10-CM | POA: Diagnosis not present

## 2023-12-16 DIAGNOSIS — E119 Type 2 diabetes mellitus without complications: Secondary | ICD-10-CM | POA: Diagnosis not present

## 2023-12-17 ENCOUNTER — Encounter (INDEPENDENT_AMBULATORY_CARE_PROVIDER_SITE_OTHER): Payer: Self-pay | Admitting: Ophthalmology

## 2023-12-17 DIAGNOSIS — N052 Unspecified nephritic syndrome with diffuse membranous glomerulonephritis: Secondary | ICD-10-CM | POA: Diagnosis not present

## 2023-12-17 DIAGNOSIS — N189 Chronic kidney disease, unspecified: Secondary | ICD-10-CM | POA: Diagnosis not present

## 2023-12-17 DIAGNOSIS — I129 Hypertensive chronic kidney disease with stage 1 through stage 4 chronic kidney disease, or unspecified chronic kidney disease: Secondary | ICD-10-CM | POA: Diagnosis not present

## 2023-12-17 DIAGNOSIS — K5732 Diverticulitis of large intestine without perforation or abscess without bleeding: Secondary | ICD-10-CM | POA: Diagnosis not present

## 2023-12-17 DIAGNOSIS — E1129 Type 2 diabetes mellitus with other diabetic kidney complication: Secondary | ICD-10-CM | POA: Diagnosis not present

## 2023-12-17 DIAGNOSIS — D631 Anemia in chronic kidney disease: Secondary | ICD-10-CM | POA: Diagnosis not present

## 2023-12-17 DIAGNOSIS — E1122 Type 2 diabetes mellitus with diabetic chronic kidney disease: Secondary | ICD-10-CM | POA: Diagnosis not present

## 2023-12-17 DIAGNOSIS — I10 Essential (primary) hypertension: Secondary | ICD-10-CM | POA: Diagnosis not present

## 2023-12-17 DIAGNOSIS — N1831 Chronic kidney disease, stage 3a: Secondary | ICD-10-CM | POA: Diagnosis not present

## 2024-01-12 DIAGNOSIS — D696 Thrombocytopenia, unspecified: Secondary | ICD-10-CM | POA: Diagnosis not present

## 2024-01-13 ENCOUNTER — Other Ambulatory Visit (HOSPITAL_BASED_OUTPATIENT_CLINIC_OR_DEPARTMENT_OTHER): Payer: Self-pay

## 2024-01-14 ENCOUNTER — Other Ambulatory Visit (HOSPITAL_BASED_OUTPATIENT_CLINIC_OR_DEPARTMENT_OTHER): Payer: Self-pay

## 2024-01-14 MED ORDER — METOPROLOL TARTRATE 50 MG PO TABS
50.0000 mg | ORAL_TABLET | Freq: Two times a day (BID) | ORAL | 3 refills | Status: DC
  Filled 2024-01-14: qty 60, 30d supply, fill #0
  Filled 2024-02-19: qty 60, 30d supply, fill #1
  Filled 2024-03-20: qty 60, 30d supply, fill #2

## 2024-01-14 MED ORDER — ROSUVASTATIN CALCIUM 40 MG PO TABS
40.0000 mg | ORAL_TABLET | Freq: Every day | ORAL | 1 refills | Status: DC
  Filled 2024-01-14: qty 90, 90d supply, fill #0
  Filled 2024-03-20 – 2024-04-07 (×2): qty 90, 90d supply, fill #1

## 2024-01-15 DIAGNOSIS — E119 Type 2 diabetes mellitus without complications: Secondary | ICD-10-CM | POA: Diagnosis not present

## 2024-01-17 ENCOUNTER — Other Ambulatory Visit (HOSPITAL_BASED_OUTPATIENT_CLINIC_OR_DEPARTMENT_OTHER): Payer: Self-pay

## 2024-02-04 ENCOUNTER — Inpatient Hospital Stay

## 2024-02-04 ENCOUNTER — Inpatient Hospital Stay: Admitting: Hematology

## 2024-02-07 ENCOUNTER — Other Ambulatory Visit (HOSPITAL_BASED_OUTPATIENT_CLINIC_OR_DEPARTMENT_OTHER): Payer: Self-pay

## 2024-02-11 ENCOUNTER — Telehealth: Payer: Self-pay | Admitting: Nurse Practitioner

## 2024-02-12 ENCOUNTER — Inpatient Hospital Stay: Admitting: Hematology

## 2024-02-12 ENCOUNTER — Inpatient Hospital Stay

## 2024-02-16 DIAGNOSIS — E119 Type 2 diabetes mellitus without complications: Secondary | ICD-10-CM | POA: Diagnosis not present

## 2024-02-17 NOTE — Progress Notes (Addendum)
 [facility]  [phone]   HEMATOLOGY ONCOLOGY CONSULTATION   Tina Briggs  DOB: [birth year]  MR#: [id number]  CSN#: [id number]     Patient Care Team: Jearldine Mina, MD as PCP - General (Internal Medicine) Jearldine Mina, MD (Internal Medicine) Verlena Glenn, MD as Consulting Physician (Nephrology) Patel, Donika K, DO as Consulting Physician (Neurology)  Reason for consult: thrombocytopenia  History of present illness:   she was referred by primary care provider. Platelet count from recent labs (01/12/2024) was 99. On cbc done 11/2023, platelet count was  124. The remainder of her CBC from 01/12/2024 was essentially normal. She did have evaluation of iron panel 11/2023 which showed low iron saturation at 15%. She did have normal platelet count in 08/2023 of 168. Earlier in 08/2023, she had been moderately anemic, but platelet count was normal. At the time, she was admitted for acute diverticulitis and sepsis/hypovolemic shock. She also had obstructing biliary stone. She did have biliary stent placement on 11/11. She previously had her gallbladder removed. She had repeat ERCP in 10/2023. She was found to have choledocholithiasis. This was completely removed along with the biliary stent, placed in 08/2023.  The patient states that since she was ill in November 2024, she has felt very fatigued. She states that she has to rest frequently. There are some days that she doesn't even want to do anything, but pushes herself to be active. Resting periodically is helpful. She does report continued, intermittent abdominal discomfort, especially in epigastric area. She has talked to her GI provider about this and he thinks it may be scar tissue from having multiple biliary stones in the past. She has had unintentional weight loss. She states that she has lost approximately 30 pounds since 08/2023. She denies fever, chills, bone, or muscle aches. She denies nausea or vomiting. She  has had frequent diarrhea, but this is baseline for her since she had initial bile duct obstruction and subsequent ductal dilation.  Socially, she is married. She lives with her husband in [place], [place]. She has 2 adult sons. She is retired. She does not smoke and has no history of smoking. She does not drink alcohol . She has not used illegal or illicit drugs. She states that she has been anemic, off and on, throughout her life. She does not know of other family members with anemia or other blood disorders. She does have an aunt and cousin on her maternal side who both had breast cancer. She denies other family history of cancer.   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 Mild to mod TI scarring. (Dr. Vernia Good)   HNP (herniated nucleus pulposus), lumbar    Recurrent   HYPERLIPIDEMIA-MIXED    HYPERTENSION, UNSPECIFIED    Hypertensive retinopathy    Injury of right hip    Lumbar back pain    Macular degeneration    Microscopic hematuria    Neuropathy    OBESITY  Plantar fasciitis    PONV (postoperative nausea and vomiting)    past history only, "sometimes they put a patch on me"   Right eye injury     SURGICAL HISTORY: Past Surgical History:  Procedure Laterality Date   ABDOMINAL HYSTERECTOMY     APPENDECTOMY     BACK SURGERY     lumbar '09"microdiscectomy"   BILIARY STENT PLACEMENT  08/11/2023   Procedure: BILIARY STENT PLACEMENT;  Surgeon: Ozell Blunt, MD;  Location[facility] ENDOSCOPY;  Service: Gastroenterology;;  10x7   BREAST BIOPSY      BREAST BIOPSY Right 05/19/2023   MM RT BREAST BX W LOC DEV EA AD LESION IMG BX SPEC STEREO GUIDE 05/19/2023 [facility]   BREAST BIOPSY Right 05/19/2023   MM RT BREAST BX W LOC DEV 1ST LESION IMAGE BX SPEC STEREO GUIDE 05/19/2023 [facility]   breat Bx benign     CHOLECYSTECTOMY     open   COLONOSCOPY WITH PROPOFOL  N/A 11/22/2014   Procedure: COLONOSCOPY WITH PROPOFOL ;  Surgeon: Garrett Kallman, MD;  Location: [facility] ENDOSCOPY;  Service: Endoscopy;  Laterality: N/A;   ENDOSCOPIC RETROGRADE CHOLANGIOPANCREATOGRAPHY (ERCP) WITH PROPOFOL  N/A 10/23/2023   Procedure: ENDOSCOPIC RETROGRADE CHOLANGIOPANCREATOGRAPHY (ERCP) WITH PROPOFOL ;  Surgeon: Ozell Blunt, MD;  Location: [facility] ENDOSCOPY;  Service: Gastroenterology;  Laterality: N/A;   ERCP N/A 05/26/2015   Procedure: ENDOSCOPIC RETROGRADE CHOLANGIOPANCREATOGRAPHY (ERCP)   (DOING CASE IN MAIN OR);  Surgeon: Delilah Fend, MD;  Location: [facility]OSCOPY;  Service: Gastroenterology;  Laterality: N/A;   ERCP N/A 06/06/2015   Procedure: ENDOSCOPIC RETROGRADE CHOLANGIOPANCREATOGRAPHY (ERCP);  Surgeon: Ozell Blunt, MD;  Location: [facility] ENDOSCOPY;  Service: Endoscopy;  Laterality: N/A;   ERCP N/A 01/12/2021   Procedure: ENDOSCOPIC RETROGRADE CHOLANGIOPANCREATOGRAPHY (ERCP);  Surgeon: Ozell Blunt, MD;  Location: [facility] ENDOSCOPY;  Service: Endoscopy;  Laterality: N/A;   ERCP N/A 08/11/2023   Procedure: ENDOSCOPIC RETROGRADE CHOLANGIOPANCREATOGRAPHY (ERCP);  Surgeon: Ozell Blunt, MD;  Location: [facility] ENDOSCOPY;  Service: Gastroenterology;  Laterality: N/A;   ESOPHAGOGASTRODUODENOSCOPY (EGD) WITH PROPOFOL  N/A 11/22/2014   Procedure: ESOPHAGOGASTRODUODENOSCOPY (EGD) WITH PROPOFOL ;  Surgeon: Garrett Kallman, MD;  Location: [facility] ENDOSCOPY;  Service: Endoscopy;  Laterality: N/A;   EYE SURGERY Bilateral    cataracts   GANGLION CYST EXCISION Right    LIPOMA EXCISION  1993   From thigh 1993   LUMBAR FUSION  09/16/2018   LUMBAR LAMINECTOMY/DECOMPRESSION MICRODISCECTOMY Left  12/31/2017   Procedure: Laminectomy and Foraminotomy - Lumbar Three-Four - left Extraforaminal diskectomy Lumbar Four-Five left;  Surgeon: Isadora Mar, MD;  Location: [facility] OR;  Service: Neurosurgery;  Laterality: Left;   PERONEAL NERVE DECOMPRESSION Left 12/31/2017   Procedure: Left PERONEAL NERVE DECOMPRESSION;  Surgeon: Isadora Mar, MD;  Location: [facility] OR;  Service: Neurosurgery;  Laterality: Left;  left   PERONEAL NERVE DECOMPRESSION     REMOVAL OF STONES  01/12/2021   Procedure: REMOVAL OF STONES;  Surgeon: Ozell Blunt, MD;  Location: [facility] ENDOSCOPY;  Service: Endoscopy;;   REMOVAL OF STONES  08/11/2023   Procedure: REMOVAL OF STONES;  Surgeon: Ozell Blunt, MD;  Location: [facility] ENDOSCOPY;  Service: Gastroenterology;;   REMOVAL OF STONES  10/23/2023   Procedure: REMOVAL OF STONES;  Surgeon: Ozell Blunt, MD;  Location: [facility] ENDOSCOPY;  Service: Gastroenterology;;   Right ganglion cyst     Right ganglion cyst remove     SPHINCTEROTOMY  08/11/2023   Procedure: SPHINCTEROTOMY;  Surgeon: Ozell Blunt, MD;  Location: [facility] ENDOSCOPY;  Service: Gastroenterology;;   [address]  10/23/2023  Procedure: SPHINCTEROTOMY;  Surgeon: Ozell Blunt, MD;  Location: [facility]OSCOPY;  Service: Gastroenterology;;   [address] REMOVAL  10/23/2023   Procedure: STENT REMOVAL;  Surgeon: Ozell Blunt, MD;  Location: [facility] ENDOSCOPY;  Service: Gastroenterology;;   TUBAL LIGATION      SOCIAL HISTORY: Social History   Socioeconomic History   Marital status: Married    Spouse name: Not on file   Number of children: 2   Years of education: Not on file   Highest education level: Not on file  Occupational History   Occupation: retired  Tobacco Use   Smoking status: Never   Smokeless tobacco: Never  Vaping Use   Vaping status: Never Used  Substance and Sexual Activity   Alcohol  use: No   Drug use: No   Sexual activity: Not Currently  Other Topics Concern   Not on file  Social History Narrative   Right Handed    Lives in a  one story home    Drinks Little Caffeine    Social Drivers of Health   Financial Resource Strain: Not on file  Food Insecurity: No Food Insecurity (02/18/2024)   Hunger Vital Sign    Worried About Running Out of Food in the Last Year: Never true    Ran Out of Food in the Last Year: Never true  Transportation Needs: No Transportation Needs (02/18/2024)   PRAPARE - Administrator, Civil Service (Medical): No    Lack of Transportation (Non-Medical): No  Physical Activity: Not on file  Stress: Not on file  Social Connections: Not on file  Intimate Partner Violence: Not At Risk (02/18/2024)   Humiliation, Afraid, Rape, and Kick questionnaire    Fear of Current or Ex-Partner: No    Emotionally Abused: No    Physically Abused: No    Sexually Abused: No    FAMILY HISTORY: Family History  Problem Relation Age of Onset   Cancer Maternal Aunt        breast cancer   Cancer Maternal Grandmother        kidney cancer    Cancer Paternal Grandmother        bone cancer    Heart disease Mother    Alzheimer's disease Father    Tremor Father        possible PD   Diabetes Mellitus II Brother    Healthy Son    Healthy Son     ALLERGIES:  is allergic to codeine, clavulanic acid, flagyl  [metronidazole ], other, prevacid [lansoprazole], tetracyclines & related, trulicity [dulaglutide], azithromycin, erythromycin, morphine and codeine, and septra [sulfamethoxazole-trimethoprim].  MEDICATIONS:  Current Outpatient Medications  Medication Sig Dispense Refill   acetaminophen  (TYLENOL ) 325 MG tablet Take 2 tablets (650 mg total) by mouth every 6 (six) hours as needed for mild pain (pain score 1-3) or fever.     cholecalciferol (VITAMIN D) 1000 units tablet Take 5,000 Units by mouth daily.     colestipol  (COLESTID ) 1 g tablet Take 2 tablets (2 g total) by mouth in the morning and at bedtime. 120 tablet 12   CRESTOR  40 MG tablet TAKE 1 TABLET AT BEDTIME (Patient taking differently: Take 40 mg  by mouth at bedtime.) 90 tablet 1   dapagliflozin propanediol (FARXIGA) 5 MG TABS tablet Take 5 mg by mouth daily.     desloratadine  (CLARINEX ) 5 MG tablet Take 1 tablet (5 mg total) by mouth daily. 90 tablet 1   diclofenac  Sodium (VOLTAREN ) 1 % GEL Apply 4 g topically 4 (four)  times daily. 100 g 0   fluticasone  (FLONASE ) 50 MCG/ACT nasal spray Place 2 sprays into both nostrils daily as needed for allergies.      FREESTYLE LITE test strip USE 1 STRIP TO CHECK GLUCOSE 4 TIMES DAILY     gabapentin  (NEURONTIN ) 100 MG capsule Take 1 capsule (100 mg total) by mouth 2 (two) times daily. 180 capsule 3   metoprolol  tartrate (LOPRESSOR ) 50 MG tablet Take 1 tablet (50 mg total) by mouth 2 (two) times daily with food. 60 tablet 3   Multiple Vitamins-Minerals (PRESERVISION AREDS 2 PO) Take 1 tablet by mouth 2 (two) times daily.     NOVOLOG  FLEXPEN RELION 100 UNIT/ML FlexPen Inject 8-10 Units into the skin 3 (three) times daily as needed. BS     rosuvastatin  (CRESTOR ) 40 MG tablet Take 1 tablet (40 mg total) by mouth daily. 90 tablet 1   Semaglutide ,0.25 or 0.5MG /DOS, (OZEMPIC , 0.25 OR 0.5 MG/DOSE,) 2 MG/3ML SOPN Inject 0.5 mg into the skin once a week. 3 mL 1   ursodiol  (ACTIGALL ) 300 MG capsule Take 1 capsule (300 mg total) by mouth in the morning and at bedtime. 60 capsule 3   venlafaxine  XR (EFFEXOR -XR) 75 MG 24 hr capsule Take 75 mg by mouth every morning.      vitamin B-12 (CYANOCOBALAMIN ) 1000 MCG tablet Take 1,000 mcg by mouth 3 (three) times a week. Take on Sundays, Wednesdays and Fridays.     amLODipine  (NORVASC ) 5 MG tablet Take 1 tablet (5 mg total) by mouth daily. 30 tablet 0   amoxicillin -clavulanate (AUGMENTIN ) 875-125 MG tablet Take 1 tablet by mouth every 12 (twelve) hours for 7 days. 14 tablet 0   No current facility-administered medications for this visit.    REVIEW OF SYSTEMS:   Constitutional: Denies fevers, chills or abnormal night sweats. Reports 30 pound weight loss over past few  months . Eyes: Denies blurriness of vision, double vision or watery eyes Ears, nose, mouth, throat, and face: Denies mucositis or sore throat Respiratory: Denies cough, dyspnea or wheezes Cardiovascular: Denies palpitation, chest discomfort or lower extremity swelling Gastrointestinal:  Denies nausea, heartburn or change in bowel habits. Persistent abdominal pain  Skin: Denies abnormal skin rashes Lymphatics: Denies new lymphadenopathy or easy bruising Neurological:Denies numbness, tingling or new weaknesses Behavioral/Psych: Mood is stable, no new changes  All other systems were reviewed with the patient and are negative.  PHYSICAL EXAMINATION: ECOG PERFORMANCE STATUS: 1 - Symptomatic but completely ambulatory  Vitals:   02/18/24 1122  BP: 130/74  Pulse: 61  Resp: 17  Temp: 98.1 F (36.7 C)  SpO2: 99%   Filed Weights   02/18/24 1122  Weight: 157 lb 8 oz (71.4 kg)    GENERAL:alert, no distress and comfortable SKIN: skin color, texture, turgor are normal, no rashes or significant lesions EYES: normal, conjunctiva are pink and non-injected, sclera clear OROPHARYNX:no exudate, no erythema and lips, buccal mucosa, and tongue normal  NECK: supple, thyroid  normal size, non-tender, without nodularity LYMPH:  no palpable lymphadenopathy in the cervical, axillary or inguinal LUNGS: clear to auscultation and percussion with normal breathing effort HEART: regular rate & rhythm and no murmurs and no lower extremity edema ABDOMEN:abdomen soft with normal bowel sounds. She has epigastric tenderness with light palpation.  Musculoskeletal:no cyanosis of digits and no clubbing  PSYCH: alert & oriented x 3 with fluent speech NEURO: no focal motor/sensory deficits  LABORATORY DATA:  I have reviewed the data as listed Lab Results  Component Value Date  WBC 4.4 02/18/2024   HGB 11.8 (L) 02/18/2024   HCT 34.9 (L) 02/18/2024   MCV 84.7 02/18/2024   PLT 95 (L) 02/18/2024   Recent Labs     08/10/23 0232 08/11/23 0436 08/12/23 0836 08/13/23 0307 08/15/23 0304 08/16/23 0338 08/17/23 0315 02/18/24 1317  NA 132*   < >  --    < > 139 141 138 141  K 4.1   < >  --    < > 3.7 3.7 3.8 4.2  CL 104   < >  --    < > 109 111 109 111  CO2 18*   < >  --    < > 21* 22 22 24   GLUCOSE 240*   < >  --    < > 115* 140* 101* 114*  BUN 52*   < >  --    < > 53* 40* 32* 32*  CREATININE 2.86*   < >  --    < > 2.99* 2.39* 2.03* 1.35*  CALCIUM  7.8*   < >  --    < > 7.7* 7.8* 7.6* 9.3  GFRNONAA 17*   < >  --    < > 16* 21* 25* 40*  PROT 5.5*  --  4.6*  --  5.0*  --   --  6.8  ALBUMIN  1.8*  --  <1.5*  --  <1.5*  --   --  4.0  AST 28  --  27  --  21  --   --  13*  ALT 56*  --  35  --  26  --   --  15  ALKPHOS 248*  --  232*  --  231*  --   --  104  BILITOT 5.0*  --  1.8*  --  0.8  --   --  0.8  BILIDIR 3.4*  --  0.9*  --  0.5*  --   --   --   IBILI 1.6*  --  0.9  --  0.3  --   --   --    < > = values in this interval not displayed.    ASSESSMENT & PLAN:  Thrombocytopenia (HCC) Assessment & Plan: The patient was referred by primary care provider. Platelet count from recent labs (01/12/2024) was 99. On cbc done 11/2023, platelet count was  124. The remainder of her CBC from 01/12/2024 was essentially normal. She did have evaluation of iron panel 11/2023 which showed low iron saturation at 15%. She did have normal platelet count in 08/2023 of 168. Earlier in 08/2023, she had been moderately anemic, but platelet count was normal. At the time, she was admitted for acute diverticulitis and sepsis/hypovolemic shock. She also had obstructing biliary stone. She did have biliary stent placement on 11/11. She previously had her gallbladder removed. She had repeat ERCP in 10/2023. She was found to have choledocholithiasis. This was completely removed along with the biliary stent, placed in 08/2023.  Discussed etiology of thrombocytopenia with the patient. Likely causes are viral, autoimmune, infection, and  inflammation. Problems with the liver and spleen can also lead to thrombocytopenia.  Reviewed labs and imaging done during hospitalization. Liver and spleen are both unremarkable. Will evaluate other causes, through blood work today. Include labs for HIV, Hepatitis B and C, ANA with reflex, sed rate, and immature platelet count.  The patient has previously been evaluated for multiple myeloma due to chronic kidney disease and glomerulonephritis. Check new SPEP and  serum light chains to rule this out as a cause of her thrombocytopenia.  Plan to contact the patient back after all labs have resulted. This will likely take about 2 weeks. Will discern plan for treatment, if needed, at that time. If labs look good overall, this may be monitored by the patient's primary care provider. The patient agrees with the current plan and voiced understanding.   Orders: -     CBC with Differential (Cancer Center Only); Future -     CMP (Cancer Center only); Future -     Save Smear for Provider Slide Review; Future -     Hepatitis C antibody; Future -     HIV Antibody (routine testing w rflx); Future -     Immature Platelet Fraction; Future -     Hepatitis B core antibody, total; Future -     Serum protein electrophoresis with reflex; Future -     Folate; Future -     Vitamin B12; Future -     Sedimentation rate; Future  The patient was seen along with Dr. Maryalice Smaller today. Time spent with the patient was approximately 35 minutes. This time included reviewing progress notes, labs, imaging studies, and discussing plan for follow up.  All questions were answered. The patient knows to call the clinic with any problems, questions or concerns.      Sharyon Deis, NP 02/18/2024 4:58 PM  Addendum I have seen the patient, examined her. I agree with the assessment and and plan and have edited the notes.   Patient was referred by primary care physician for thrombocytopenia.  She has had low platelet count intermittently  since 2020.  She had a severe thrombocytopenia with platelets in 20s in November 2024 when she was hospitalized for E. coli bacteremia and sepsis.  It resolved afterwards.  The reason lab at her PCPs office showed a mild thrombocytopenia in 90's.  She has past medical history of hypertension, dyslipidemia, diabetes, anxiety, but no known history of liver disease, she does not drink alcohol  regularly.  Her CT scan in November 2024 showed no significant liver disease or splenomegaly.  We discussed, etiology for thrombocytopenia, including infection, nutritional such as B12 deficiency, liver disease, chronic virus infection especially hepatitis B&C, HIV, etc., ITP, and primary bone marrow disease.  Given the intermittent nature of thrombocytopenia, I do not have high suspicion for MDS or other primary bone marrow disease.  I do not think she needs a bone marrow biopsy.  Will repeat her CBC today, and obtain additional lab.  She previously saw me for elevated light chain level, will repeat multiple myeloma lab.  All questions were answered, I spent a total of 30 minutes for her visit today  Sonja Steele MD 02/18/2024

## 2024-02-18 ENCOUNTER — Inpatient Hospital Stay

## 2024-02-18 ENCOUNTER — Inpatient Hospital Stay: Attending: Nurse Practitioner | Admitting: Nurse Practitioner

## 2024-02-18 VITALS — BP 130/74 | HR 61 | Temp 98.1°F | Resp 17 | Wt 157.5 lb

## 2024-02-18 DIAGNOSIS — Z8051 Family history of malignant neoplasm of kidney: Secondary | ICD-10-CM

## 2024-02-18 DIAGNOSIS — D696 Thrombocytopenia, unspecified: Secondary | ICD-10-CM | POA: Diagnosis not present

## 2024-02-18 DIAGNOSIS — E1159 Type 2 diabetes mellitus with other circulatory complications: Secondary | ICD-10-CM | POA: Diagnosis not present

## 2024-02-18 DIAGNOSIS — Z803 Family history of malignant neoplasm of breast: Secondary | ICD-10-CM

## 2024-02-18 DIAGNOSIS — Z79899 Other long term (current) drug therapy: Secondary | ICD-10-CM | POA: Diagnosis not present

## 2024-02-18 DIAGNOSIS — Z808 Family history of malignant neoplasm of other organs or systems: Secondary | ICD-10-CM

## 2024-02-18 DIAGNOSIS — I10 Essential (primary) hypertension: Secondary | ICD-10-CM | POA: Diagnosis not present

## 2024-02-18 LAB — CBC WITH DIFFERENTIAL (CANCER CENTER ONLY)
Abs Immature Granulocytes: 0.02 10*3/uL (ref 0.00–0.07)
Basophils Absolute: 0 10*3/uL (ref 0.0–0.1)
Basophils Relative: 1 %
Eosinophils Absolute: 0.1 10*3/uL (ref 0.0–0.5)
Eosinophils Relative: 2 %
HCT: 34.9 % — ABNORMAL LOW (ref 36.0–46.0)
Hemoglobin: 11.8 g/dL — ABNORMAL LOW (ref 12.0–15.0)
Immature Granulocytes: 1 %
Lymphocytes Relative: 17 %
Lymphs Abs: 0.7 10*3/uL (ref 0.7–4.0)
MCH: 28.6 pg (ref 26.0–34.0)
MCHC: 33.8 g/dL (ref 30.0–36.0)
MCV: 84.7 fL (ref 80.0–100.0)
Monocytes Absolute: 0.4 10*3/uL (ref 0.1–1.0)
Monocytes Relative: 9 %
Neutro Abs: 3.1 10*3/uL (ref 1.7–7.7)
Neutrophils Relative %: 70 %
Platelet Count: 95 10*3/uL — ABNORMAL LOW (ref 150–400)
RBC: 4.12 MIL/uL (ref 3.87–5.11)
RDW: 15.1 % (ref 11.5–15.5)
WBC Count: 4.4 10*3/uL (ref 4.0–10.5)
nRBC: 0 % (ref 0.0–0.2)

## 2024-02-18 LAB — CMP (CANCER CENTER ONLY)
ALT: 15 U/L (ref 0–44)
AST: 13 U/L — ABNORMAL LOW (ref 15–41)
Albumin: 4 g/dL (ref 3.5–5.0)
Alkaline Phosphatase: 104 U/L (ref 38–126)
Anion gap: 6 (ref 5–15)
BUN: 32 mg/dL — ABNORMAL HIGH (ref 8–23)
CO2: 24 mmol/L (ref 22–32)
Calcium: 9.3 mg/dL (ref 8.9–10.3)
Chloride: 111 mmol/L (ref 98–111)
Creatinine: 1.35 mg/dL — ABNORMAL HIGH (ref 0.44–1.00)
GFR, Estimated: 40 mL/min — ABNORMAL LOW (ref >60)
Glucose, Bld: 114 mg/dL — ABNORMAL HIGH (ref 70–99)
Potassium: 4.2 mmol/L (ref 3.5–5.1)
Sodium: 141 mmol/L (ref 135–145)
Total Bilirubin: 0.8 mg/dL (ref 0.0–1.2)
Total Protein: 6.8 g/dL (ref 6.5–8.1)

## 2024-02-18 LAB — FOLATE: Folate: 8.6 ng/mL (ref >5.9)

## 2024-02-18 LAB — HEPATITIS C ANTIBODY: HCV Ab: NONREACTIVE

## 2024-02-18 LAB — IMMATURE PLATELET FRACTION: Immature Platelet Fraction: 1.3 % (ref 1.2–8.6)

## 2024-02-18 LAB — HIV ANTIBODY (ROUTINE TESTING W REFLEX): HIV Screen 4th Generation wRfx: NONREACTIVE

## 2024-02-18 LAB — SAVE SMEAR(SSMR), FOR PROVIDER SLIDE REVIEW

## 2024-02-18 LAB — SEDIMENTATION RATE: Sed Rate: 9 mm/h (ref 0–22)

## 2024-02-18 LAB — VITAMIN B12: Vitamin B-12: 434 pg/mL (ref 180–914)

## 2024-02-18 NOTE — Assessment & Plan Note (Signed)
 The patient was referred by primary care provider. Platelet count from recent labs (01/12/2024) was 99. On cbc done 11/2023, platelet count was  124. The remainder of her CBC from 01/12/2024 was essentially normal. She did have evaluation of iron panel 11/2023 which showed low iron saturation at 15%. She did have normal platelet count in 08/2023 of 168. Earlier in 08/2023, she had been moderately anemic, but platelet count was normal. At the time, she was admitted for acute diverticulitis and sepsis/hypovolemic shock. She also had obstructing biliary stone. She did have biliary stent placement on 11/11. She previously had her gallbladder removed. She had repeat ERCP in 10/2023. She was found to have choledocholithiasis. This was completely removed along with the biliary stent, placed in 08/2023.  Discussed etiology of thrombocytopenia with the patient. Likely causes are viral, autoimmune, infection, and inflammation. Problems with the liver and spleen can also lead to thrombocytopenia.  Reviewed labs and imaging done during hospitalization. Liver and spleen are both unremarkable. Will evaluate other causes, through blood work today. Include labs for HIV, Hepatitis B and C, ANA with reflex, sed rate, and immature platelet count.  The patient has previously been evaluated for multiple myeloma due to chronic kidney disease and glomerulonephritis. Check new SPEP and serum light chains to rule this out as a cause of her thrombocytopenia.  Plan to contact the patient back after all labs have resulted. This will likely take about 2 weeks. Will discern plan for treatment, if needed, at that time. If labs look good overall, this may be monitored by the patient's primary care provider. The patient agrees with the current plan and voiced understanding.

## 2024-02-19 ENCOUNTER — Other Ambulatory Visit: Payer: Self-pay

## 2024-02-19 ENCOUNTER — Other Ambulatory Visit (HOSPITAL_BASED_OUTPATIENT_CLINIC_OR_DEPARTMENT_OTHER): Payer: Self-pay

## 2024-02-19 LAB — PROTEIN ELECTROPHORESIS, SERUM, WITH REFLEX
A/G Ratio: 1.1 (ref 0.7–1.7)
Albumin ELP: 3.2 g/dL (ref 2.9–4.4)
Alpha-1-Globulin: 0.2 g/dL (ref 0.0–0.4)
Alpha-2-Globulin: 0.7 g/dL (ref 0.4–1.0)
Beta Globulin: 0.9 g/dL (ref 0.7–1.3)
Gamma Globulin: 0.9 g/dL (ref 0.4–1.8)
Globulin, Total: 2.8 g/dL (ref 2.2–3.9)
Total Protein ELP: 6 g/dL (ref 6.0–8.5)

## 2024-02-19 LAB — HEPATITIS B CORE ANTIBODY, TOTAL: HEP B CORE AB: NEGATIVE

## 2024-02-28 ENCOUNTER — Other Ambulatory Visit (HOSPITAL_BASED_OUTPATIENT_CLINIC_OR_DEPARTMENT_OTHER): Payer: Self-pay

## 2024-03-01 DIAGNOSIS — E119 Type 2 diabetes mellitus without complications: Secondary | ICD-10-CM | POA: Diagnosis not present

## 2024-03-01 DIAGNOSIS — H04123 Dry eye syndrome of bilateral lacrimal glands: Secondary | ICD-10-CM | POA: Diagnosis not present

## 2024-03-01 DIAGNOSIS — H524 Presbyopia: Secondary | ICD-10-CM | POA: Diagnosis not present

## 2024-03-04 DIAGNOSIS — K831 Obstruction of bile duct: Secondary | ICD-10-CM | POA: Diagnosis not present

## 2024-03-04 DIAGNOSIS — R109 Unspecified abdominal pain: Secondary | ICD-10-CM | POA: Diagnosis not present

## 2024-03-08 ENCOUNTER — Other Ambulatory Visit (HOSPITAL_BASED_OUTPATIENT_CLINIC_OR_DEPARTMENT_OTHER): Payer: Self-pay

## 2024-03-16 ENCOUNTER — Encounter: Payer: Self-pay | Admitting: Nurse Practitioner

## 2024-03-17 ENCOUNTER — Other Ambulatory Visit: Payer: Self-pay | Admitting: Nurse Practitioner

## 2024-03-17 ENCOUNTER — Telehealth: Payer: Self-pay | Admitting: Nurse Practitioner

## 2024-03-17 DIAGNOSIS — E119 Type 2 diabetes mellitus without complications: Secondary | ICD-10-CM | POA: Diagnosis not present

## 2024-03-17 DIAGNOSIS — D696 Thrombocytopenia, unspecified: Secondary | ICD-10-CM

## 2024-03-17 NOTE — Telephone Encounter (Signed)
 Scheduled appointment per 6/18 secure chat. Talked with the patient and she is aware of the made appointment.

## 2024-03-20 ENCOUNTER — Other Ambulatory Visit (HOSPITAL_BASED_OUTPATIENT_CLINIC_OR_DEPARTMENT_OTHER): Payer: Self-pay

## 2024-03-20 ENCOUNTER — Other Ambulatory Visit: Payer: Self-pay

## 2024-03-24 DIAGNOSIS — E1122 Type 2 diabetes mellitus with diabetic chronic kidney disease: Secondary | ICD-10-CM | POA: Diagnosis not present

## 2024-03-24 DIAGNOSIS — Z794 Long term (current) use of insulin: Secondary | ICD-10-CM | POA: Diagnosis not present

## 2024-03-24 DIAGNOSIS — I700 Atherosclerosis of aorta: Secondary | ICD-10-CM | POA: Diagnosis not present

## 2024-03-24 DIAGNOSIS — N1832 Chronic kidney disease, stage 3b: Secondary | ICD-10-CM | POA: Diagnosis not present

## 2024-03-24 DIAGNOSIS — D509 Iron deficiency anemia, unspecified: Secondary | ICD-10-CM | POA: Diagnosis not present

## 2024-04-13 DIAGNOSIS — N052 Unspecified nephritic syndrome with diffuse membranous glomerulonephritis: Secondary | ICD-10-CM | POA: Diagnosis not present

## 2024-04-13 DIAGNOSIS — G250 Essential tremor: Secondary | ICD-10-CM | POA: Diagnosis not present

## 2024-04-13 DIAGNOSIS — E1122 Type 2 diabetes mellitus with diabetic chronic kidney disease: Secondary | ICD-10-CM | POA: Diagnosis not present

## 2024-04-13 DIAGNOSIS — I10 Essential (primary) hypertension: Secondary | ICD-10-CM | POA: Diagnosis not present

## 2024-04-13 DIAGNOSIS — K5732 Diverticulitis of large intestine without perforation or abscess without bleeding: Secondary | ICD-10-CM | POA: Diagnosis not present

## 2024-04-13 DIAGNOSIS — R5383 Other fatigue: Secondary | ICD-10-CM | POA: Diagnosis not present

## 2024-04-16 DIAGNOSIS — E1122 Type 2 diabetes mellitus with diabetic chronic kidney disease: Secondary | ICD-10-CM | POA: Diagnosis not present

## 2024-04-22 ENCOUNTER — Inpatient Hospital Stay: Attending: Nurse Practitioner

## 2024-04-22 DIAGNOSIS — D696 Thrombocytopenia, unspecified: Secondary | ICD-10-CM | POA: Insufficient documentation

## 2024-04-22 LAB — CBC WITH DIFFERENTIAL (CANCER CENTER ONLY)
Abs Immature Granulocytes: 0.01 K/uL (ref 0.00–0.07)
Basophils Absolute: 0 K/uL (ref 0.0–0.1)
Basophils Relative: 1 %
Eosinophils Absolute: 0.1 K/uL (ref 0.0–0.5)
Eosinophils Relative: 4 %
HCT: 36.4 % (ref 36.0–46.0)
Hemoglobin: 11.9 g/dL — ABNORMAL LOW (ref 12.0–15.0)
Immature Granulocytes: 0 %
Lymphocytes Relative: 17 %
Lymphs Abs: 0.6 K/uL — ABNORMAL LOW (ref 0.7–4.0)
MCH: 29.1 pg (ref 26.0–34.0)
MCHC: 32.7 g/dL (ref 30.0–36.0)
MCV: 89 fL (ref 80.0–100.0)
Monocytes Absolute: 0.3 K/uL (ref 0.1–1.0)
Monocytes Relative: 8 %
Neutro Abs: 2.5 K/uL (ref 1.7–7.7)
Neutrophils Relative %: 70 %
Platelet Count: 85 K/uL — ABNORMAL LOW (ref 150–400)
RBC: 4.09 MIL/uL (ref 3.87–5.11)
RDW: 14.3 % (ref 11.5–15.5)
WBC Count: 3.6 K/uL — ABNORMAL LOW (ref 4.0–10.5)
nRBC: 0 % (ref 0.0–0.2)

## 2024-04-22 LAB — FERRITIN: Ferritin: 67 ng/mL (ref 11–307)

## 2024-04-22 LAB — IRON AND IRON BINDING CAPACITY (CC-WL,HP ONLY)
Iron: 57 ug/dL (ref 28–170)
Saturation Ratios: 15 % (ref 10.4–31.8)
TIBC: 381 ug/dL (ref 250–450)
UIBC: 324 ug/dL (ref 148–442)

## 2024-04-27 ENCOUNTER — Ambulatory Visit: Payer: Self-pay | Admitting: Nurse Practitioner

## 2024-04-28 ENCOUNTER — Inpatient Hospital Stay

## 2024-05-17 DIAGNOSIS — E119 Type 2 diabetes mellitus without complications: Secondary | ICD-10-CM | POA: Diagnosis not present

## 2024-05-22 ENCOUNTER — Other Ambulatory Visit (HOSPITAL_BASED_OUTPATIENT_CLINIC_OR_DEPARTMENT_OTHER): Payer: Self-pay

## 2024-06-16 ENCOUNTER — Other Ambulatory Visit (HOSPITAL_BASED_OUTPATIENT_CLINIC_OR_DEPARTMENT_OTHER): Payer: Self-pay

## 2024-06-16 ENCOUNTER — Other Ambulatory Visit: Payer: Self-pay

## 2024-06-16 DIAGNOSIS — E119 Type 2 diabetes mellitus without complications: Secondary | ICD-10-CM | POA: Diagnosis not present

## 2024-06-16 MED ORDER — ROSUVASTATIN CALCIUM 40 MG PO TABS
40.0000 mg | ORAL_TABLET | Freq: Every day | ORAL | 1 refills | Status: DC
  Filled 2024-06-16: qty 90, 90d supply, fill #0
  Filled 2024-09-20: qty 90, 90d supply, fill #1

## 2024-07-01 ENCOUNTER — Other Ambulatory Visit (HOSPITAL_BASED_OUTPATIENT_CLINIC_OR_DEPARTMENT_OTHER): Payer: Self-pay

## 2024-07-01 MED ORDER — DESLORATADINE 5 MG PO TABS
5.0000 mg | ORAL_TABLET | Freq: Every day | ORAL | 0 refills | Status: AC
  Filled 2024-07-01: qty 30, 30d supply, fill #0

## 2024-07-05 ENCOUNTER — Other Ambulatory Visit (HOSPITAL_BASED_OUTPATIENT_CLINIC_OR_DEPARTMENT_OTHER): Payer: Self-pay

## 2024-07-16 DIAGNOSIS — E119 Type 2 diabetes mellitus without complications: Secondary | ICD-10-CM | POA: Diagnosis not present

## 2024-07-24 ENCOUNTER — Other Ambulatory Visit (HOSPITAL_BASED_OUTPATIENT_CLINIC_OR_DEPARTMENT_OTHER): Payer: Self-pay

## 2024-07-26 ENCOUNTER — Other Ambulatory Visit (HOSPITAL_BASED_OUTPATIENT_CLINIC_OR_DEPARTMENT_OTHER): Payer: Self-pay

## 2024-07-26 DIAGNOSIS — E782 Mixed hyperlipidemia: Secondary | ICD-10-CM | POA: Diagnosis not present

## 2024-07-26 DIAGNOSIS — N1832 Chronic kidney disease, stage 3b: Secondary | ICD-10-CM | POA: Diagnosis not present

## 2024-07-26 DIAGNOSIS — I700 Atherosclerosis of aorta: Secondary | ICD-10-CM | POA: Diagnosis not present

## 2024-07-26 DIAGNOSIS — D696 Thrombocytopenia, unspecified: Secondary | ICD-10-CM | POA: Diagnosis not present

## 2024-07-26 DIAGNOSIS — Z0000 Encounter for general adult medical examination without abnormal findings: Secondary | ICD-10-CM | POA: Diagnosis not present

## 2024-07-26 DIAGNOSIS — E538 Deficiency of other specified B group vitamins: Secondary | ICD-10-CM | POA: Diagnosis not present

## 2024-07-26 DIAGNOSIS — G250 Essential tremor: Secondary | ICD-10-CM | POA: Diagnosis not present

## 2024-07-26 DIAGNOSIS — D509 Iron deficiency anemia, unspecified: Secondary | ICD-10-CM | POA: Diagnosis not present

## 2024-07-26 DIAGNOSIS — N2581 Secondary hyperparathyroidism of renal origin: Secondary | ICD-10-CM | POA: Diagnosis not present

## 2024-07-26 DIAGNOSIS — G629 Polyneuropathy, unspecified: Secondary | ICD-10-CM | POA: Diagnosis not present

## 2024-07-26 DIAGNOSIS — Z23 Encounter for immunization: Secondary | ICD-10-CM | POA: Diagnosis not present

## 2024-07-26 DIAGNOSIS — E1122 Type 2 diabetes mellitus with diabetic chronic kidney disease: Secondary | ICD-10-CM | POA: Diagnosis not present

## 2024-07-26 DIAGNOSIS — I10 Essential (primary) hypertension: Secondary | ICD-10-CM | POA: Diagnosis not present

## 2024-07-26 MED ORDER — LORAZEPAM 0.5 MG PO TABS
0.5000 mg | ORAL_TABLET | Freq: Every day | ORAL | 1 refills | Status: AC | PRN
  Filled 2024-07-26: qty 30, 30d supply, fill #0

## 2024-07-30 ENCOUNTER — Other Ambulatory Visit (HOSPITAL_BASED_OUTPATIENT_CLINIC_OR_DEPARTMENT_OTHER): Payer: Self-pay

## 2024-08-03 ENCOUNTER — Other Ambulatory Visit: Payer: Self-pay | Admitting: Nurse Practitioner

## 2024-08-03 ENCOUNTER — Other Ambulatory Visit: Payer: Self-pay

## 2024-08-03 DIAGNOSIS — D696 Thrombocytopenia, unspecified: Secondary | ICD-10-CM

## 2024-08-04 ENCOUNTER — Encounter: Payer: Self-pay | Admitting: Nurse Practitioner

## 2024-08-04 ENCOUNTER — Inpatient Hospital Stay: Payer: Self-pay | Admitting: Nurse Practitioner

## 2024-08-04 ENCOUNTER — Inpatient Hospital Stay: Attending: Nurse Practitioner

## 2024-08-04 ENCOUNTER — Inpatient Hospital Stay: Payer: Self-pay

## 2024-08-04 ENCOUNTER — Inpatient Hospital Stay (HOSPITAL_BASED_OUTPATIENT_CLINIC_OR_DEPARTMENT_OTHER): Admitting: Nurse Practitioner

## 2024-08-04 VITALS — BP 123/70 | HR 73 | Temp 97.6°F | Resp 13 | Wt 159.9 lb

## 2024-08-04 DIAGNOSIS — D696 Thrombocytopenia, unspecified: Secondary | ICD-10-CM

## 2024-08-04 LAB — CBC WITH DIFFERENTIAL (CANCER CENTER ONLY)
Abs Immature Granulocytes: 0.02 K/uL (ref 0.00–0.07)
Basophils Absolute: 0 K/uL (ref 0.0–0.1)
Basophils Relative: 1 %
Eosinophils Absolute: 0.1 K/uL (ref 0.0–0.5)
Eosinophils Relative: 2 %
HCT: 36.2 % (ref 36.0–46.0)
Hemoglobin: 11.9 g/dL — ABNORMAL LOW (ref 12.0–15.0)
Immature Granulocytes: 0 %
Lymphocytes Relative: 15 %
Lymphs Abs: 0.7 K/uL (ref 0.7–4.0)
MCH: 29 pg (ref 26.0–34.0)
MCHC: 32.9 g/dL (ref 30.0–36.0)
MCV: 88.1 fL (ref 80.0–100.0)
Monocytes Absolute: 0.5 K/uL (ref 0.1–1.0)
Monocytes Relative: 11 %
Neutro Abs: 3.3 K/uL (ref 1.7–7.7)
Neutrophils Relative %: 71 %
Platelet Count: 89 K/uL — ABNORMAL LOW (ref 150–400)
RBC: 4.11 MIL/uL (ref 3.87–5.11)
RDW: 14.4 % (ref 11.5–15.5)
WBC Count: 4.6 K/uL (ref 4.0–10.5)
nRBC: 0 % (ref 0.0–0.2)

## 2024-08-04 NOTE — Progress Notes (Signed)
 [facility]   [phone] [phone]    Patient Care Team: Ransom Other, MD as PCP - General (Internal Medicine) Ransom Other, MD (Internal Medicine) Betsey Channel, MD as Consulting Physician (Nephrology) Patel, Donika K, DO as Consulting Physician (Neurology)   CHIEF COMPLAINT: Follow up thrombocytopenia   CURRENT THERAPY: Observation   INTERVAL HISTORY Tina Briggs returns for follow up as scheduled. Last seen by Tina Lessen, NP 02/18/24.  It took her year but she has finally recovered from bacteremia in 08/2023, feels much better overall with more energy.  Denies recent infection or bleeding.  She fell in [place] , hit her head on the floor, no LOC, had some bruising.  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 Mild to mod TI scarring. (Dr. Betsey)   HNP (herniated nucleus pulposus), lumbar    Recurrent   HYPERLIPIDEMIA-MIXED    HYPERTENSION, UNSPECIFIED    Hypertensive retinopathy    Injury of right hip    Lumbar back pain    Macular degeneration    Microscopic hematuria    Neuropathy    OBESITY    Plantar fasciitis    PONV (postoperative nausea and vomiting)    past history only, sometimes they put a patch on me   Right eye injury      Past Surgical History:  Procedure Laterality Date   ABDOMINAL HYSTERECTOMY     APPENDECTOMY     BACK SURGERY     lumbar '   BILIARY STENT PLACEMENT  08/11/2023   Procedure: BILIARY STENT  PLACEMENT;  Surgeon: Rosalie Kitchens, MD;  Location: [facility] ENDOSCOPY;  Service: Gastroenterology;;  10x7   BREAST BIOPSY     BREAST BIOPSY Right 05/19/2023   MM RT BREAST BX W LOC DEV EA AD LESION IMG BX SPEC STEREO GUIDE 05/19/2023 [facility]   BREAST BIOPSY Right 05/19/2023   MM RT BREAST BX W LOC DEV 1ST LESION IMAGE BX SPEC STEREO GUIDE 05/19/2023 [facility]   breat Bx benign     CHOLECYSTECTOMY     open   COLONOSCOPY WITH PROPOFOL  N/A 11/22/2014   Procedure: COLONOSCOPY WITH PROPOFOL ;  Surgeon: Gladis MARLA Louder, MD;  Location: [facility] ENDOSCOPY;  Service: Endoscopy;  Laterality: N/A;   ENDOSCOPIC RETROGRADE CHOLANGIOPANCREATOGRAPHY (ERCP) WITH PROPOFOL  N/A 10/23/2023   Procedure: ENDOSCOPIC RETROGRADE CHOLANGIOPANCREATOGRAPHY (ERCP) WITH PROPOFOL ;  Surgeon: Rosalie Kitchens, MD;  Location: [facility] ENDOSCOPY;  Service: Gastroenterology;  Laterality: N/A;   ERCP N/A 05/26/2015   Procedure: ENDOSCOPIC RETROGRADE CHOLANGIOPANCREATOGRAPHY (ERCP)   (DOING CASE IN MAIN OR);  Surgeon: Norleen Hint, MD;  Location: [facility];  Service: Gastroenterology;  Laterality: N/A;   ERCP N/A 06/06/2015   Procedure: ENDOSCOPIC RETROGRADE CHOLANGIOPANCREATOGRAPHY (ERCP);  Surgeon: Kitchens Rosalie, MD;  Location: [facility];  Service: Endoscopy;  Laterality: N/A;   ERCP N/A 01/12/2021   Procedure: ENDOSCOPIC RETROGRADE CHOLANGIOPANCREATOGRAPHY (ERCP);  Surgeon: Rosalie Kitchens, MD;  Location:  [facility] ENDOSCOPY;  Service: Endoscopy;  Laterality: N/A;   ERCP N/A 08/11/2023   Procedure: ENDOSCOPIC RETROGRADE CHOLANGIOPANCREATOGRAPHY (ERCP);  Surgeon: Rosalie Kitchens, MD;  Location: [facility] ENDOSCOPY;  Service: Gastroenterology;  Laterality: N/A;   ESOPHAGOGASTRODUODENOSCOPY (EGD) WITH PROPOFOL  N/A 11/22/2014   Procedure: ESOPHAGOGASTRODUODENOSCOPY (EGD) WITH PROPOFOL ;  Surgeon: Gladis MARLA Louder, MD;  Location: [facility] ENDOSCOPY;  Service: Endoscopy;  Laterality: N/A;   EYE SURGERY Bilateral    cataracts   GANGLION CYST EXCISION Right    LIPOMA  EXCISION  1993   From thigh 1993   LUMBAR FUSION  09/16/2018   LUMBAR LAMINECTOMY/DECOMPRESSION MICRODISCECTOMY Left 12/31/2017   Procedure: Laminectomy and Foraminotomy - Lumbar Three-Four - left Extraforaminal diskectomy Lumbar Four-Five left;  Surgeon: Joshua Alm RAMAN, MD;  Location: [facility] OR;  Service: Neurosurgery;  Laterality: Left;   PERONEAL NERVE DECOMPRESSION Left 12/31/2017   Procedure: Left PERONEAL NERVE DECOMPRESSION;  Surgeon: Joshua Alm RAMAN, MD;  Location: [facility] OR;  Service: Neurosurgery;  Laterality: Left;  left   PERONEAL NERVE DECOMPRESSION     REMOVAL OF STONES  01/12/2021   Procedure: REMOVAL OF STONES;  Surgeon: Rosalie Kitchens, MD;  Location: [facility] ENDOSCOPY;  Service: Endoscopy;;   REMOVAL OF STONES  08/11/2023   Procedure: REMOVAL OF STONES;  Surgeon: Rosalie Kitchens, MD;  Location: [facility] ENDOSCOPY;  Service: Gastroenterology;;   REMOVAL OF STONES  10/23/2023   Procedure: REMOVAL OF STONES;  Surgeon: Rosalie Kitchens, MD;  Location: [facility] ENDOSCOPY;  Service: Gastroenterology;;   Right ganglion cyst     Right ganglion cyst remove     SPHINCTEROTOMY  08/11/2023   Procedure: SPHINCTEROTOMY;  Surgeon: Rosalie Kitchens, MD;  Location: [facility] ENDOSCOPY;  Service: Gastroenterology;;   ANNETT  10/23/2023   Procedure: ANNETT;  Surgeon: Rosalie Kitchens, MD;  Location: [facility] ENDOSCOPY;  Service: Gastroenterology;;   CLEDA REMOVAL  10/23/2023   Procedure: STENT REMOVAL;  Surgeon: Rosalie Kitchens, MD;  Location: [facility] ENDOSCOPY;  Service: Gastroenterology;;   TUBAL LIGATION       Outpatient Encounter Medications as of 08/04/2024  Medication Sig Note   acetaminophen  (TYLENOL ) 325 MG tablet Take 2 tablets (650 mg total) by mouth every 6 (six) hours as needed for mild pain (pain score 1-3) or fever.    cholecalciferol (VITAMIN D) 1000 units tablet Take 5,000 Units by mouth daily.    colestipol  (COLESTID ) 1 g tablet Take 2 tablets (2 g total) by mouth in the morning and at bedtime.    dapagliflozin propanediol  (FARXIGA) 5 MG TABS tablet Take 5 mg by mouth daily.    desloratadine  (CLARINEX ) 5 MG tablet Take 1 tablet (5 mg total) by mouth daily.    fluticasone  (FLONASE ) 50 MCG/ACT nasal spray Place 2 sprays into both nostrils daily as needed for allergies.  08/06/2023: prn   FREESTYLE LITE test strip USE 1 STRIP TO CHECK GLUCOSE 4 TIMES DAILY    gabapentin  (NEURONTIN ) 100 MG capsule Take 1 capsule (100 mg total) by mouth 2 (two) times daily.    LORazepam (ATIVAN) 0.5 MG tablet Take 1 tablet (0.5 mg total) by mouth daily as needed.    losartan  (COZAAR ) 25 MG tablet Take 25 mg by mouth daily.    Multiple Vitamins-Minerals (PRESERVISION AREDS 2 PO) Take 1 tablet by mouth 2 (two) times daily.    NOVOLOG  FLEXPEN RELION 100 UNIT/ML FlexPen Inject 8-10 Units into the skin 3 (three) times daily as needed. BS    rosuvastatin  (CRESTOR ) 40 MG tablet Take 1 tablet (40 mg total) by mouth  daily.    ursodiol  (ACTIGALL ) 300 MG capsule Take 1 capsule (300 mg total) by mouth in the morning and at bedtime.    venlafaxine  XR (EFFEXOR -XR) 75 MG 24 hr capsule Take 75 mg by mouth every morning.     vitamin B-12 (CYANOCOBALAMIN ) 1000 MCG tablet Take 1,000 mcg by mouth 3 (three) times a week. Take on Sundays, Wednesdays and Fridays.    [DISCONTINUED] amLODipine  (NORVASC ) 5 MG tablet Take 1 tablet (5 mg total) by mouth daily.    [DISCONTINUED] amoxicillin -clavulanate (AUGMENTIN ) 875-125 MG tablet Take 1 tablet by mouth every 12 (twelve) hours for 7 days.    [DISCONTINUED] CRESTOR  40 MG tablet TAKE 1 TABLET AT BEDTIME (Patient taking differently: Take 40 mg by mouth at bedtime.)    [DISCONTINUED] desloratadine  (CLARINEX ) 5 MG tablet Take 1 tablet (5 mg total) by mouth daily. 08/06/2023: prn   [DISCONTINUED] diclofenac  Sodium (VOLTAREN ) 1 % GEL Apply 4 g topically 4 (four) times daily. 08/06/2023: prn   [DISCONTINUED] metoprolol  tartrate (LOPRESSOR ) 50 MG tablet Take 1 tablet (50 mg total) by mouth 2 (two) times daily with food.     [DISCONTINUED] Semaglutide ,0.25 or 0.5MG /DOS, (OZEMPIC , 0.25 OR 0.5 MG/DOSE,) 2 MG/3ML SOPN Inject 0.5 mg into the skin once a week.    No facility-administered encounter medications on file as of 08/04/2024.     Today's Vitals   08/04/24 1002  BP: 123/70  Pulse: 73  Resp: 13  Temp: 97.6 F (36.4 C)  TempSrc: Temporal  SpO2: 100%  Weight: 159 lb 14.4 oz (72.5 kg)   Body mass index is 28.33 kg/m.    PHYSICAL EXAM GENERAL:alert, no distress and comfortable SKIN: no rash, petechiae, or ecchymosis EYES: sclera clear NECK: without mass LYMPH:  no palpable cervical or supraclavicular lymphadenopathy  LUNGS: clear with normal breathing effort HEART: regular rate & rhythm, no lower extremity edema ABDOMEN: abdomen soft, non-tender and normal bowel sounds.  No hepatosplenomegaly NEURO: alert & oriented x 3 with fluent speech, no focal motor/sensory deficits   CBC    Latest Ref Rng & Units 08/04/2024    9:30 AM 04/22/2024   11:36 AM 02/18/2024    1:17 PM  CBC  WBC 4.0 - 10.5 K/uL 4.6  3.6  4.4   Hemoglobin 12.0 - 15.0 g/dL 88.0  88.0  88.1   Hematocrit 36.0 - 46.0 % 36.2  36.4  34.9   Platelets 150 - 400 K/uL 89  85  95       CMP     Latest Ref Rng & Units 02/18/2024    1:17 PM 08/17/2023    3:15 AM 08/16/2023    3:38 AM  CMP  Glucose 70 - 99 mg/dL 885  898  859   BUN 8 - 23 mg/dL 32  32  40   Creatinine 0.44 - 1.00 mg/dL 8.64  7.96  7.60   Sodium 135 - 145 mmol/L 141  138  141   Potassium 3.5 - 5.1 mmol/L 4.2  3.8  3.7   Chloride 98 - 111 mmol/L 111  109  111   CO2 22 - 32 mmol/L 24  22  22    Calcium  8.9 - 10.3 mg/dL 9.3  7.6  7.8   Total Protein 6.5 - 8.1 g/dL 6.8     Total Bilirubin 0.0 - 1.2 mg/dL 0.8     Alkaline Phos 38 - 126 U/L 104     AST 15 - 41 U/L 13     ALT 0 -  44 U/L 15         ASSESSMENT & PLAN: 78 year old female  Thrombocytopenia -She had low platelet count intermittently since 2020 -She had severe thrombocytopenia platelets in the 20s in  08/2023 during hospitalization for E. coli bacteremia and sepsis which subsequently resolved -She had recurrent thrombocytopenia at PCP platelets in the 90s and was referred to us  -Workup 01/2024 showed no iron/folate/B12 deficiencies, negative hep B/C/HIV, unremarkable SPEP, and normal immature platelet fractionation -Prior imaging showed fatty liver which may be contributing -I reviewed the above workup, this could still be ITP. -Ms. Lenahan appears stable, no signs of active bleeding. Today's plt count stable at 89 K -An indolent bone marrow condition such as MDS has not been ruled out, but given the stability, no other cytopenias and no constitutional symptoms I am not recommending that at this time -I recommend to continue monitoring.  Okay to proceed with scheduled surgeries at this level.  She would not require treatment unless platelet drop 20-30 K or active bleeding   PLAN: -Reviewed previous work up and today's labs, stable plt 89K  -Continue monitoring with lab q3 moths for now -Reviewed precautions -F/up in 6 months, or sooner if needed   Orders Placed This Encounter  Procedures   CBC with Differential (Cancer Center Only)    Standing Status:   Standing    Number of Occurrences:   3    Expiration Date:   08/04/2025      All questions were answered. The patient knows to call the clinic with any problems, questions or concerns. No barriers to learning were detected. I spent 20 minutes counseling the patient face to face. The total time spent in the appointment was 30 minutes and more than 50% was on counseling, review of test results, and coordination of care.   Zohan Shiflet K Rufus Beske, NP 08/04/2024

## 2024-08-09 DIAGNOSIS — N052 Unspecified nephritic syndrome with diffuse membranous glomerulonephritis: Secondary | ICD-10-CM | POA: Diagnosis not present

## 2024-08-09 DIAGNOSIS — I10 Essential (primary) hypertension: Secondary | ICD-10-CM | POA: Diagnosis not present

## 2024-08-09 DIAGNOSIS — D696 Thrombocytopenia, unspecified: Secondary | ICD-10-CM | POA: Diagnosis not present

## 2024-08-09 DIAGNOSIS — N2581 Secondary hyperparathyroidism of renal origin: Secondary | ICD-10-CM | POA: Diagnosis not present

## 2024-08-09 DIAGNOSIS — E1122 Type 2 diabetes mellitus with diabetic chronic kidney disease: Secondary | ICD-10-CM | POA: Diagnosis not present

## 2024-08-10 DIAGNOSIS — D1801 Hemangioma of skin and subcutaneous tissue: Secondary | ICD-10-CM | POA: Diagnosis not present

## 2024-08-10 DIAGNOSIS — L820 Inflamed seborrheic keratosis: Secondary | ICD-10-CM | POA: Diagnosis not present

## 2024-08-10 DIAGNOSIS — Z85828 Personal history of other malignant neoplasm of skin: Secondary | ICD-10-CM | POA: Diagnosis not present

## 2024-08-10 DIAGNOSIS — L821 Other seborrheic keratosis: Secondary | ICD-10-CM | POA: Diagnosis not present

## 2024-08-11 ENCOUNTER — Other Ambulatory Visit (HOSPITAL_BASED_OUTPATIENT_CLINIC_OR_DEPARTMENT_OTHER): Payer: Self-pay

## 2024-08-16 DIAGNOSIS — E119 Type 2 diabetes mellitus without complications: Secondary | ICD-10-CM | POA: Diagnosis not present

## 2024-08-23 ENCOUNTER — Other Ambulatory Visit: Payer: Self-pay | Admitting: Internal Medicine

## 2024-08-23 DIAGNOSIS — Z1231 Encounter for screening mammogram for malignant neoplasm of breast: Secondary | ICD-10-CM

## 2024-08-24 ENCOUNTER — Ambulatory Visit
Admission: RE | Admit: 2024-08-24 | Discharge: 2024-08-24 | Disposition: A | Discharge status: Home / Self Care | Source: Ambulatory Visit | Attending: Internal Medicine | Admitting: Internal Medicine

## 2024-08-24 DIAGNOSIS — Z1231 Encounter for screening mammogram for malignant neoplasm of breast: Secondary | ICD-10-CM | POA: Diagnosis not present

## 2024-09-08 DIAGNOSIS — R932 Abnormal findings on diagnostic imaging of liver and biliary tract: Secondary | ICD-10-CM | POA: Diagnosis not present

## 2024-09-13 ENCOUNTER — Inpatient Hospital Stay: Admitting: Hematology

## 2024-09-13 ENCOUNTER — Inpatient Hospital Stay: Attending: Nurse Practitioner

## 2024-09-15 ENCOUNTER — Other Ambulatory Visit (HOSPITAL_BASED_OUTPATIENT_CLINIC_OR_DEPARTMENT_OTHER): Payer: Self-pay

## 2024-09-15 MED ORDER — GABAPENTIN 100 MG PO CAPS
100.0000 mg | ORAL_CAPSULE | Freq: Two times a day (BID) | ORAL | 3 refills | Status: AC
  Filled 2024-10-07: qty 180, 90d supply, fill #0

## 2024-09-18 ENCOUNTER — Other Ambulatory Visit (HOSPITAL_BASED_OUTPATIENT_CLINIC_OR_DEPARTMENT_OTHER): Payer: Self-pay

## 2024-10-08 ENCOUNTER — Other Ambulatory Visit (HOSPITAL_BASED_OUTPATIENT_CLINIC_OR_DEPARTMENT_OTHER): Payer: Self-pay

## 2024-10-13 ENCOUNTER — Other Ambulatory Visit (HOSPITAL_BASED_OUTPATIENT_CLINIC_OR_DEPARTMENT_OTHER): Payer: Self-pay

## 2024-10-13 MED ORDER — ROSUVASTATIN CALCIUM 40 MG PO TABS
40.0000 mg | ORAL_TABLET | Freq: Every day | ORAL | 1 refills | Status: AC
  Filled 2024-10-13: qty 90, 90d supply, fill #0

## 2024-10-22 ENCOUNTER — Other Ambulatory Visit: Payer: Self-pay

## 2024-10-27 ENCOUNTER — Other Ambulatory Visit (HOSPITAL_BASED_OUTPATIENT_CLINIC_OR_DEPARTMENT_OTHER): Payer: Self-pay

## 2024-10-28 ENCOUNTER — Other Ambulatory Visit: Payer: Self-pay

## 2024-10-28 ENCOUNTER — Other Ambulatory Visit (HOSPITAL_BASED_OUTPATIENT_CLINIC_OR_DEPARTMENT_OTHER): Payer: Self-pay

## 2024-10-28 MED ORDER — COLESTIPOL HCL 1 G PO TABS
2.0000 g | ORAL_TABLET | Freq: Two times a day (BID) | ORAL | 99 refills | Status: AC
  Filled 2024-10-28: qty 120, 30d supply, fill #1
  Filled 2024-10-28: qty 120, 30d supply, fill #0

## 2024-11-01 ENCOUNTER — Inpatient Hospital Stay

## 2024-11-03 ENCOUNTER — Inpatient Hospital Stay: Attending: Nurse Practitioner

## 2024-11-03 DIAGNOSIS — D696 Thrombocytopenia, unspecified: Secondary | ICD-10-CM

## 2024-11-03 LAB — CBC WITH DIFFERENTIAL (CANCER CENTER ONLY)
Abs Immature Granulocytes: 0.02 10*3/uL (ref 0.00–0.07)
Basophils Absolute: 0 10*3/uL (ref 0.0–0.1)
Basophils Relative: 1 %
Eosinophils Absolute: 0.1 10*3/uL (ref 0.0–0.5)
Eosinophils Relative: 3 %
HCT: 36.9 % (ref 36.0–46.0)
Hemoglobin: 12.3 g/dL (ref 12.0–15.0)
Immature Granulocytes: 1 %
Lymphocytes Relative: 17 %
Lymphs Abs: 0.6 10*3/uL — ABNORMAL LOW (ref 0.7–4.0)
MCH: 28.1 pg (ref 26.0–34.0)
MCHC: 33.3 g/dL (ref 30.0–36.0)
MCV: 84.4 fL (ref 80.0–100.0)
Monocytes Absolute: 0.4 10*3/uL (ref 0.1–1.0)
Monocytes Relative: 10 %
Neutro Abs: 2.6 10*3/uL (ref 1.7–7.7)
Neutrophils Relative %: 68 %
Platelet Count: 87 10*3/uL — ABNORMAL LOW (ref 150–400)
RBC: 4.37 MIL/uL (ref 3.87–5.11)
RDW: 14.5 % (ref 11.5–15.5)
WBC Count: 3.8 10*3/uL — ABNORMAL LOW (ref 4.0–10.5)
nRBC: 0 % (ref 0.0–0.2)

## 2024-11-04 ENCOUNTER — Ambulatory Visit: Payer: Self-pay | Admitting: Nurse Practitioner

## 2024-12-13 ENCOUNTER — Encounter (INDEPENDENT_AMBULATORY_CARE_PROVIDER_SITE_OTHER): Admitting: Ophthalmology

## 2025-02-01 ENCOUNTER — Inpatient Hospital Stay

## 2025-02-01 ENCOUNTER — Inpatient Hospital Stay: Admitting: Hematology
